# Patient Record
Sex: Female | Born: 1985
Health system: Southern US, Community
[De-identification: ages and names within clinical notes are randomized; demographics above are authoritative.]

## PROBLEM LIST (undated history)

## (undated) DIAGNOSIS — G4733 Obstructive sleep apnea (adult) (pediatric): Secondary | ICD-10-CM

## (undated) DIAGNOSIS — Z227 Latent tuberculosis: Secondary | ICD-10-CM

## (undated) DIAGNOSIS — T7840XA Allergy, unspecified, initial encounter: Secondary | ICD-10-CM

## (undated) DIAGNOSIS — Z5189 Encounter for other specified aftercare: Secondary | ICD-10-CM

## (undated) DIAGNOSIS — E669 Obesity, unspecified: Secondary | ICD-10-CM

## (undated) DIAGNOSIS — B977 Papillomavirus as the cause of diseases classified elsewhere: Secondary | ICD-10-CM

## (undated) DIAGNOSIS — F419 Anxiety disorder, unspecified: Secondary | ICD-10-CM

## (undated) DIAGNOSIS — F32A Depression, unspecified: Secondary | ICD-10-CM

## (undated) DIAGNOSIS — G4730 Sleep apnea, unspecified: Secondary | ICD-10-CM

## (undated) DIAGNOSIS — D649 Anemia, unspecified: Secondary | ICD-10-CM

## (undated) DIAGNOSIS — R7303 Prediabetes: Secondary | ICD-10-CM

## (undated) DIAGNOSIS — F410 Panic disorder [episodic paroxysmal anxiety] without agoraphobia: Secondary | ICD-10-CM

## (undated) DIAGNOSIS — F4310 Post-traumatic stress disorder, unspecified: Secondary | ICD-10-CM

## (undated) DIAGNOSIS — G43909 Migraine, unspecified, not intractable, without status migrainosus: Secondary | ICD-10-CM

## (undated) DIAGNOSIS — E785 Hyperlipidemia, unspecified: Secondary | ICD-10-CM

## (undated) DIAGNOSIS — J45909 Unspecified asthma, uncomplicated: Secondary | ICD-10-CM

## (undated) DIAGNOSIS — K219 Gastro-esophageal reflux disease without esophagitis: Secondary | ICD-10-CM

## (undated) HISTORY — DX: Unspecified asthma, uncomplicated: J45.909

## (undated) HISTORY — DX: Sleep apnea, unspecified: G47.30

## (undated) HISTORY — PX: OTHER SURGICAL HISTORY: SHX169

## (undated) HISTORY — DX: Encounter for other specified aftercare: Z51.89

## (undated) HISTORY — DX: Allergy, unspecified, initial encounter: T78.40XA

## (undated) HISTORY — PX: APPENDECTOMY: SHX54

## (undated) HISTORY — DX: Gastro-esophageal reflux disease without esophagitis: K21.9

## (undated) HISTORY — DX: Hyperlipidemia, unspecified: E78.5

## (undated) HISTORY — DX: Papillomavirus as the cause of diseases classified elsewhere: B97.7

## (undated) HISTORY — DX: Prediabetes: R73.03

## (undated) HISTORY — DX: Depression, unspecified: F32.A

## (undated) HISTORY — DX: Anemia, unspecified: D64.9

## (undated) HISTORY — DX: Migraine, unspecified, not intractable, without status migrainosus: G43.909

## (undated) HISTORY — DX: Latent tuberculosis: Z22.7

## (undated) HISTORY — PX: WISDOM TOOTH EXTRACTION: SHX21

## (undated) HISTORY — DX: Obstructive sleep apnea (adult) (pediatric): G47.33

---

## 2013-08-29 ENCOUNTER — Observation Stay: Payer: Self-pay | Admitting: Obstetrics and Gynecology

## 2013-08-30 ENCOUNTER — Observation Stay: Payer: Self-pay | Admitting: Obstetrics and Gynecology

## 2013-09-02 ENCOUNTER — Inpatient Hospital Stay: Payer: Self-pay | Admitting: Obstetrics and Gynecology

## 2013-09-02 LAB — CBC WITH DIFFERENTIAL/PLATELET
Basophil #: 0 10*3/uL (ref 0.0–0.1)
Basophil %: 0.8 %
Eosinophil #: 0.1 10*3/uL (ref 0.0–0.7)
Eosinophil %: 0.8 %
HCT: 35.2 % (ref 35.0–47.0)
HGB: 11.7 g/dL — ABNORMAL LOW (ref 12.0–16.0)
Lymphocyte #: 1.5 10*3/uL (ref 1.0–3.6)
Lymphocyte %: 23.5 %
MCH: 26.9 pg (ref 26.0–34.0)
MCHC: 33.2 g/dL (ref 32.0–36.0)
MCV: 81 fL (ref 80–100)
Monocyte #: 0.5 x10 3/mm (ref 0.2–0.9)
Monocyte %: 7.5 %
Neutrophil #: 4.4 10*3/uL (ref 1.4–6.5)
Neutrophil %: 67.4 %
Platelet: 147 10*3/uL — ABNORMAL LOW (ref 150–440)
RBC: 4.35 10*6/uL (ref 3.80–5.20)
RDW: 15.6 % — ABNORMAL HIGH (ref 11.5–14.5)
WBC: 6.6 10*3/uL (ref 3.6–11.0)

## 2013-09-03 LAB — HEMATOCRIT: HCT: 33 % — ABNORMAL LOW (ref 35.0–47.0)

## 2013-11-29 ENCOUNTER — Emergency Department: Payer: Self-pay | Admitting: Emergency Medicine

## 2014-02-18 ENCOUNTER — Emergency Department: Payer: Self-pay | Admitting: Emergency Medicine

## 2014-03-31 ENCOUNTER — Emergency Department: Payer: Self-pay | Admitting: Emergency Medicine

## 2014-03-31 LAB — URINALYSIS, COMPLETE
Bilirubin,UR: NEGATIVE
Blood: NEGATIVE
Glucose,UR: NEGATIVE mg/dL (ref 0–75)
Ketone: NEGATIVE
Leukocyte Esterase: NEGATIVE
Nitrite: NEGATIVE
Ph: 6 (ref 4.5–8.0)
Protein: NEGATIVE
RBC,UR: NONE SEEN /HPF (ref 0–5)
Specific Gravity: 1.03 (ref 1.003–1.030)

## 2014-03-31 LAB — BASIC METABOLIC PANEL
Anion Gap: 6 — ABNORMAL LOW (ref 7–16)
BUN: 11 mg/dL (ref 7–18)
Calcium, Total: 8.9 mg/dL (ref 8.5–10.1)
Chloride: 106 mmol/L (ref 98–107)
Co2: 27 mmol/L (ref 21–32)
Creatinine: 0.87 mg/dL (ref 0.60–1.30)
EGFR (African American): >60
EGFR (Non-African Amer.): >60
Glucose: 113 mg/dL — ABNORMAL HIGH (ref 65–99)
Osmolality: 278 (ref 275–301)
Potassium: 3.4 mmol/L — ABNORMAL LOW (ref 3.5–5.1)
Sodium: 139 mmol/L (ref 136–145)

## 2014-03-31 LAB — CBC
HCT: 36.4 % (ref 35.0–47.0)
HGB: 11.9 g/dL — ABNORMAL LOW (ref 12.0–16.0)
MCH: 27.1 pg (ref 26.0–34.0)
MCHC: 32.8 g/dL (ref 32.0–36.0)
MCV: 83 fL (ref 80–100)
Platelet: 187 10*3/uL (ref 150–440)
RBC: 4.41 10*6/uL (ref 3.80–5.20)
RDW: 13.4 % (ref 11.5–14.5)
WBC: 5.4 10*3/uL (ref 3.6–11.0)

## 2014-03-31 LAB — TROPONIN I
Troponin-I: <0.02 ng/mL
Troponin-I: <0.02 ng/mL

## 2014-09-17 ENCOUNTER — Emergency Department: Payer: Self-pay | Admitting: Emergency Medicine

## 2014-09-17 LAB — URINALYSIS, COMPLETE
Bacteria: NONE SEEN
Bilirubin,UR: NEGATIVE
Blood: NEGATIVE
Glucose,UR: NEGATIVE mg/dL (ref 0–75)
Ketone: NEGATIVE
Leukocyte Esterase: NEGATIVE
Nitrite: NEGATIVE
Ph: 5 (ref 4.5–8.0)
Protein: NEGATIVE
RBC,UR: <1 /HPF (ref 0–5)
Specific Gravity: 1.018 (ref 1.003–1.030)
Squamous Epithelial: 1
WBC UR: <1 /HPF (ref 0–5)

## 2014-09-17 LAB — COMPREHENSIVE METABOLIC PANEL
Albumin: 3.7 g/dL (ref 3.4–5.0)
Alkaline Phosphatase: 57 U/L
Anion Gap: 9 (ref 7–16)
BUN: 11 mg/dL (ref 7–18)
Bilirubin,Total: 0.4 mg/dL (ref 0.2–1.0)
Calcium, Total: 8.7 mg/dL (ref 8.5–10.1)
Chloride: 107 mmol/L (ref 98–107)
Co2: 24 mmol/L (ref 21–32)
Creatinine: 0.82 mg/dL (ref 0.60–1.30)
Glucose: 101 mg/dL — ABNORMAL HIGH (ref 65–99)
Osmolality: 279 (ref 275–301)
Potassium: 3.5 mmol/L (ref 3.5–5.1)
SGOT(AST): 13 U/L — ABNORMAL LOW (ref 15–37)
SGPT (ALT): 16 U/L
Sodium: 140 mmol/L (ref 136–145)
Total Protein: 7.5 g/dL (ref 6.4–8.2)

## 2014-09-17 LAB — CBC WITH DIFFERENTIAL/PLATELET
Basophil #: 0 10*3/uL (ref 0.0–0.1)
Basophil %: 0.8 %
Eosinophil #: 0.1 10*3/uL (ref 0.0–0.7)
Eosinophil %: 1.1 %
HCT: 37.4 % (ref 35.0–47.0)
HGB: 12.1 g/dL (ref 12.0–16.0)
Lymphocyte #: 2.1 10*3/uL (ref 1.0–3.6)
Lymphocyte %: 41.9 %
MCH: 26.9 pg (ref 26.0–34.0)
MCHC: 32.5 g/dL (ref 32.0–36.0)
MCV: 83 fL (ref 80–100)
Monocyte #: 0.3 x10 3/mm (ref 0.2–0.9)
Monocyte %: 6.6 %
Neutrophil #: 2.5 10*3/uL (ref 1.4–6.5)
Neutrophil %: 49.6 %
Platelet: 202 10*3/uL (ref 150–440)
RBC: 4.51 10*6/uL (ref 3.80–5.20)
RDW: 14.1 % (ref 11.5–14.5)
WBC: 5 10*3/uL (ref 3.6–11.0)

## 2014-10-28 ENCOUNTER — Emergency Department: Payer: Self-pay | Admitting: Internal Medicine

## 2014-11-21 DIAGNOSIS — F4310 Post-traumatic stress disorder, unspecified: Secondary | ICD-10-CM

## 2014-11-21 HISTORY — DX: Post-traumatic stress disorder, unspecified: F43.10

## 2014-12-31 ENCOUNTER — Emergency Department: Payer: Self-pay | Admitting: Emergency Medicine

## 2015-01-30 ENCOUNTER — Emergency Department: Payer: Self-pay | Admitting: Emergency Medicine

## 2015-02-08 ENCOUNTER — Emergency Department: Payer: Self-pay | Admitting: Emergency Medicine
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
## 2015-03-31 NOTE — H&P (Signed)
L&D Evaluation:  History Expanded:  HPI 29 yo G3P2002 at 354w0d by EDC of 09/02/13 by 8 week ultrasound.  Her pregnancy has been complicated by morbid obesity, gbs urinary tract infection.  She presents with regular uterine contractions.  No LOF, no VB. positive fetal movement.  [facility]. Notably G1 was complicated by postpartum hemorrhage requiring transfusion of 2 units pRBCs. G2 was uncomplicated.   Blood Type (Maternal) O positive   Group B Strep Results Maternal (Result >5wks must be treated as unknown) positive   Maternal HIV Negative   Maternal Syphilis Ab Nonreactive   Rubella Results (Maternal) immune   Presents with decreased fetal movement   Patient's Medical History 1) Morbid obesity, 2)anxiety   Patient's Surgical History Appendectomy   Medications Pre Natal Vitamins   Allergies ASA, reglan   Social History none   Family History Non-Contributory   ROS:  ROS All systems were reviewed.  HEENT, CNS, GI, GU, Respiratory, CV, Renal and Musculoskeletal systems were found to be normal.   Exam:  Vital Signs stable  BP 127/80, RR16, P100   Urine Protein not completed   General no apparent distress   Mental Status clear   Chest clear   Heart normal sinus rhythm   Abdomen gravid, non-tender   Estimated Fetal Weight Average for gestational age   Fetal Position cephalic   Back no CVAT   Edema no edema   Pelvic 4cm => 5.5cm per RN over 1 hour   Mebranes Intact   FHT normal rate with no decels, 135, moderate, +accels, +early/variable decels   Ucx 2-3 q 10 min   Skin no lesions   Impression:  Impression 1) Intrauterine pregnancy at 344w0d, 2) morbid obesity, 3) active labor   Plan:  Plan EFM/NST, monitor contractions and for cervical change, antibiotics for GBBS prophylaxis   Comments - Admit for labor - Expectant management - GBS +: Ampicillin - Fetal well being: reassuring overall - clear liquid diet, IVF -  CBC/T&S   Electronic Signatures: Conard NovakJackson, Carlis Blanchard D (MD)  (Signed 13-Oct-14 06:42)  Authored: L&D Evaluation   Last Updated: 13-Oct-14 06:42 by Conard NovakJackson, Harshil Cavallaro D (MD)

## 2015-03-31 NOTE — H&P (Signed)
L&D Evaluation:  History:  HPI 29 yo G3P2002 [facility] with reactive NST on 08/28/13 in clinic an 08/29/13 here on L&D who calls reporting decreased fetal movemetn.  She does report fetal movement but states she has not obtained 10 movement in an hour.  No LOF, no VB no ctx.  [facility].  PNC thus far noteable for BMI of 42,1-hr glucola of 98, GBS bacteruria.   Presents with decreased fetal movement   Patient's Medical History anxiety   Patient's Surgical History Appendectomy   Medications Pre Natal Vitamins   Allergies ASA, reglan   Social History none   Family History Non-Contributory   ROS:  ROS All systems were reviewed.  HEENT, CNS, GI, GU, Respiratory, CV, Renal and Musculoskeletal systems were found to be normal.   Exam:  Vital Signs stable   Urine Protein not completed   General no apparent distress   Mental Status clear   FHT normal rate with no decels, 140, moderate, +accels, no decels  reactive NST category I tracing   Ucx absent   Impression:  Impression decreased fetal movement   Plan:  Plan EFM/NST   Comments reactive NST routine labor precautions with repeat NST/AFI [facility] coming [facility]   Electronic Signatures: Lorrene ReidStaebler, Marika Mahaffy M (MD)  (Signed 10-Oct-14 22:28)  Authored: L&D Evaluation   Last Updated: 10-Oct-14 22:28 by Lorrene ReidStaebler, Yashvi Jasinski M (MD)

## 2015-04-01 ENCOUNTER — Ambulatory Visit
Admission: RE | Admit: 2015-04-01 | Discharge: 2015-04-01 | Disposition: A | Discharge status: Home / Self Care | Payer: Disability Insurance | Source: Ambulatory Visit | Attending: Internal Medicine | Admitting: Internal Medicine

## 2015-04-01 ENCOUNTER — Other Ambulatory Visit: Payer: Self-pay | Admitting: Internal Medicine

## 2015-04-01 DIAGNOSIS — M1990 Unspecified osteoarthritis, unspecified site: Secondary | ICD-10-CM

## 2015-04-01 DIAGNOSIS — M7732 Calcaneal spur, left foot: Secondary | ICD-10-CM | POA: Diagnosis not present

## 2015-04-01 DIAGNOSIS — F329 Major depressive disorder, single episode, unspecified: Secondary | ICD-10-CM | POA: Diagnosis not present

## 2015-04-01 DIAGNOSIS — R52 Pain, unspecified: Secondary | ICD-10-CM

## 2015-04-01 DIAGNOSIS — H539 Unspecified visual disturbance: Secondary | ICD-10-CM | POA: Diagnosis not present

## 2015-04-01 DIAGNOSIS — M25862 Other specified joint disorders, left knee: Secondary | ICD-10-CM | POA: Insufficient documentation

## 2015-05-22 LAB — HM PAP SMEAR: HM Pap smear: NEGATIVE

## 2015-07-14 ENCOUNTER — Emergency Department
Admission: EM | Admit: 2015-07-14 | Discharge: 2015-07-14 | Disposition: A | Discharge status: Home / Self Care | Payer: Medicaid Other | Attending: Emergency Medicine | Admitting: Emergency Medicine

## 2015-07-14 ENCOUNTER — Emergency Department: Payer: Medicaid Other

## 2015-07-14 ENCOUNTER — Encounter: Payer: Self-pay | Admitting: Emergency Medicine

## 2015-07-14 DIAGNOSIS — Y998 Other external cause status: Secondary | ICD-10-CM | POA: Insufficient documentation

## 2015-07-14 DIAGNOSIS — Y92009 Unspecified place in unspecified non-institutional (private) residence as the place of occurrence of the external cause: Secondary | ICD-10-CM | POA: Insufficient documentation

## 2015-07-14 DIAGNOSIS — Z79899 Other long term (current) drug therapy: Secondary | ICD-10-CM | POA: Insufficient documentation

## 2015-07-14 DIAGNOSIS — M25531 Pain in right wrist: Secondary | ICD-10-CM

## 2015-07-14 DIAGNOSIS — S060X0A Concussion without loss of consciousness, initial encounter: Secondary | ICD-10-CM | POA: Diagnosis not present

## 2015-07-14 DIAGNOSIS — S6991XA Unspecified injury of right wrist, hand and finger(s), initial encounter: Secondary | ICD-10-CM | POA: Diagnosis present

## 2015-07-14 DIAGNOSIS — Y9389 Activity, other specified: Secondary | ICD-10-CM | POA: Diagnosis not present

## 2015-07-14 DIAGNOSIS — Z3202 Encounter for pregnancy test, result negative: Secondary | ICD-10-CM | POA: Diagnosis not present

## 2015-07-14 HISTORY — DX: Post-traumatic stress disorder, unspecified: F43.10

## 2015-07-14 HISTORY — DX: Panic disorder (episodic paroxysmal anxiety): F41.0

## 2015-07-14 HISTORY — DX: Anxiety disorder, unspecified: F41.9

## 2015-07-14 MED ORDER — ACETAMINOPHEN 500 MG PO TABS
1000.0000 mg | ORAL_TABLET | Freq: Once | ORAL | Status: AC
  Administered 2015-07-14: 1000 mg via ORAL

## 2015-07-14 MED ORDER — ACETAMINOPHEN 500 MG PO TABS
ORAL_TABLET | ORAL | Status: AC
  Administered 2015-07-14: 1000 mg via ORAL
  Filled 2015-07-14: qty 2
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
## 2015-07-14 NOTE — Discharge Instructions (Signed)
Concussion °A concussion is a brain injury. It is caused by: °· A hit to the head. °· A quick and sudden movement (jolt) of the head or neck. °A concussion is usually not life threatening. Even so, it can cause serious problems. If you had a concussion before, you may have concussion-like problems after a hit to your head. °HOME CARE °General Instructions °· Follow your doctor's directions carefully. °· Take medicines only as told by your doctor. °· Only take medicines your doctor says are safe. °· Do not drink alcohol until your doctor says it is okay. Alcohol and some drugs can slow down healing. They can also put you at risk for further injury. °· If you are having trouble remembering things, write them down. °· Try to do one thing at a time if you get distracted easily. For example, do not watch TV while making dinner. °· Talk to your family members or close friends when making important decisions. °· Follow up with your doctor as told. °· Watch your symptoms. Tell others to do the same. Serious problems can sometimes happen after a concussion. Older adults are more likely to have these problems. °· Tell your teachers, school nurse, school counselor, coach, athletic trainer, or work manager about your concussion. Tell them about what you can or cannot do. They should watch to see if: °¨ It gets even harder for you to pay attention or concentrate. °¨ It gets even harder for you to remember things or learn new things. °¨ You need more time than normal to finish things. °¨ You become annoyed (irritable) more than before. °¨ You are not able to deal with stress as well. °¨ You have more problems than before. °· Rest. Make sure you: °¨ Get plenty of sleep at night. °¨ Go to sleep early. °¨ Go to bed at the same time every day. Try to wake up at the same time. °¨ Rest during the day. °¨ Take naps when you feel tired. °· Limit activities where you have to think a lot or concentrate. These include: °¨ Doing  homework. °¨ Doing work related to a job. °¨ Watching TV. °¨ Using the computer. °Returning To Your Regular Activities °Return to your normal activities slowly, not all at once. You must give your body and brain enough time to heal.  °· Do not play sports or do other athletic activities until your doctor says it is okay. °· Ask your doctor when you can drive, ride a bicycle, or work other vehicles or machines. Never do these things if you feel dizzy. °· Ask your doctor about when you can return to work or school. °Preventing Another Concussion °It is very important to avoid another brain injury, especially before you have healed. In rare cases, another injury can lead to permanent brain damage, brain swelling, or death. The risk of this is greatest during the first 7-10 days after your injury. Avoid injuries by:  °· Wearing a seat belt when riding in a car. °· Not drinking too much alcohol. °· Avoiding activities that could lead to a second concussion (such as contact sports). °· Wearing a helmet when doing activities like: °¨ Biking. °¨ Skiing. °¨ Skateboarding. °¨ Skating. °· Making your home safer by: °¨ Removing things from the floor or stairways that could make you trip. °¨ Using grab bars in bathrooms and handrails by stairs. °¨ Placing non-slip mats on floors and in bathtubs. °¨ Improve lighting in dark areas. °GET HELP IF: °· It   gets even harder for you to pay attention or concentrate.  It gets even harder for you to remember things or learn new things.  You need more time than normal to finish things.  You become annoyed (irritable) more than before.  You are not able to deal with stress as well.  You have more problems than before.  You have problems keeping your balance.  You are not able to react quickly when you should. Get help if you have any of these problems for more than 2 weeks:   Lasting (chronic) headaches.  Dizziness or trouble balancing.  Feeling sick to your stomach  (nausea).  Seeing (vision) problems.  Being affected by noises or light more than normal.  Feeling sad, low, down in the dumps, blue, gloomy, or empty (depressed).  Mood changes (mood swings).  Feeling of fear or nervousness about what may happen (anxiety).  Feeling annoyed.  Memory problems.  Problems concentrating or paying attention.  Sleep problems.  Feeling tired all the time. GET HELP RIGHT AWAY IF:   You have bad headaches or your headaches get worse.  You have weakness (even if it is in one hand, leg, or part of the face).  You have loss of feeling (numbness).  You feel off balance.  You keep throwing up (vomiting).  You feel tired.  One black center of your eye (pupil) is larger than the other.  You twitch or shake violently (convulse).  Your speech is not clear (slurred).  You are more confused, easily angered (agitated), or annoyed than before.  You have more trouble resting than before.  You are unable to recognize people or places.  You have neck pain.  It is difficult to wake you up.  You have unusual behavior changes.  You pass out (lose consciousness). MAKE SURE YOU:   Understand these instructions.  Will watch your condition.  Will get help right away if you are not doing well or get worse. Document Released: 10/26/2009 Document Revised: 03/24/2014 Document Reviewed: 05/30/2013 [facility] Patient Information 2015 [place], [place]. This information is not intended to replace advice given to you by your health care provider. Make sure you discuss any questions you have with your health care provider.  Please follow up with Dr Ernest Pine as instructed. Wear the wrist splint. Keep your wrist elevated as much as possible. You can use ice to the area 20 minutes every hour or so while you are awaye for the first 24 hours.  Take tylenol for the pain.

## 2015-07-14 NOTE — ED Notes (Signed)
Urine preg. Negative.
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
## 2015-07-15 LAB — POCT PREGNANCY, URINE: Preg Test, Ur: NEGATIVE

## 2015-09-22 ENCOUNTER — Emergency Department: Payer: Medicaid Other

## 2015-09-22 ENCOUNTER — Emergency Department
Admission: EM | Admit: 2015-09-22 | Discharge: 2015-09-22 | Disposition: A | Discharge status: Home / Self Care | Payer: Medicaid Other | Attending: Emergency Medicine | Admitting: Emergency Medicine

## 2015-09-22 ENCOUNTER — Encounter: Payer: Self-pay | Admitting: Emergency Medicine

## 2015-09-22 DIAGNOSIS — M25532 Pain in left wrist: Secondary | ICD-10-CM

## 2015-09-22 DIAGNOSIS — Z79899 Other long term (current) drug therapy: Secondary | ICD-10-CM | POA: Insufficient documentation

## 2015-09-22 MED ORDER — NAPROXEN 500 MG PO TBEC: 500.0000 mg | DELAYED_RELEASE_TABLET | Freq: Two times a day (BID) | ORAL | Status: DC

## 2015-09-22 MED ORDER — HYDROCODONE-ACETAMINOPHEN 5-325 MG PO TABS: 1.0000 | ORAL_TABLET | ORAL | Status: DC | PRN

## 2015-09-22 NOTE — ED Notes (Signed)
C/o left wrist pain and states she feels "knot" to right wrist, states she has had this off and on for a while

## 2015-09-22 NOTE — ED Provider Notes (Signed)
[facility] Emergency Department Provider Note  ____________________________________________  Time seen: Approximately 10:28 AM  I have reviewed the triage vital signs and the nursing notes.   HISTORY  Chief Complaint Wrist Pain    HPI Jasmine Gordon is a 29 y.o. female who presents for evaluation of wrist pain on the left side on and off for "a while". In addition she feels a knot to the right wrist.Located on the dorsum side and not will come and go. Denies any trauma.   Past Medical History  Diagnosis Date  . PTSD (post-traumatic stress disorder) 2016  . Anxiety   . Panic attack     There are no active problems to display for this patient.   Past Surgical History  Procedure Laterality Date  . Appendectomy      Current Outpatient Rx  Name  Route  Sig  Dispense  Refill  . escitalopram (LEXAPRO) 10 MG tablet   Oral   Take 10 mg by mouth daily.         Marland Kitchen HYDROcodone-acetaminophen (NORCO) 5-325 MG tablet   Oral   Take 1-2 tablets by mouth every 4 (four) hours as needed for moderate pain.   10 tablet   0   . naproxen (EC NAPROSYN) 500 MG EC tablet   Oral   Take 1 tablet (500 mg total) by mouth 2 (two) times daily with a meal.   60 tablet   0     Allergies Asa and Reglan  No family history on file.  Social History Social History  Substance Use Topics  . Smoking status: Never Smoker   . Smokeless tobacco: Never Used  . Alcohol Use: Yes     Comment: occ    Review of Systems Constitutional: No fever/chills Eyes: No visual changes. ENT: No sore throat. Cardiovascular: Denies chest pain. Respiratory: Denies shortness of breath. Gastrointestinal: No abdominal pain.  No nausea, no vomiting.  No diarrhea.  No constipation. Genitourinary: Negative for dysuria. Musculoskeletal: Positive for left wrist pain. Skin: Negative for rash. Neurological: Negative for headaches, focal weakness or numbness.  10-point ROS otherwise  negative.  ____________________________________________   PHYSICAL EXAM:  VITAL SIGNS: ED Triage Vitals  Enc Vitals Group     BP 09/22/15 0948 127/69 mmHg     Pulse Rate 09/22/15 0948 109     Resp 09/22/15 0948 18     Temp 09/22/15 0948 99.4 F (37.4 C)     Temp Source 09/22/15 0948 Oral     SpO2 09/22/15 0948 98 %     Weight 09/22/15 0946 263 lb (119.296 kg)     Height 09/22/15 0946  (1.702 m)     Head Cir --      Peak Flow --      Pain Score 09/22/15 0947 8     Pain Loc --      Pain Edu? --      Excl. in GC? --     Constitutional: Alert and oriented. Well appearing and in no acute distress.   Cardiovascular: Normal rate, regular rhythm. Grossly normal heart sounds.  Good peripheral circulation. Respiratory: Normal respiratory effort.  No retractions. Lungs CTAB. Musculoskeletal: Left wrist with some point tenderness noted at the crease. Increased pain with flexion. No obvious cyst noted. No erythema noted. Neurologic:  Normal speech and language. No gross focal neurologic deficits are appreciated. No gait instability. Skin:  Skin is warm, dry and intact. No rash noted. Psychiatric: Mood and affect  are normal. Speech and behavior are normal.  ____________________________________________   LABS (all labs ordered are listed, but only abnormal results are displayed)  Labs Reviewed - No data to display ____________________________________________   RADIOLOGY  Negative for bony abnormality or arthropathy. ____________________________________________   PROCEDURES  Procedure(s) performed: None  Critical Care performed: No  ____________________________________________   INITIAL IMPRESSION / ASSESSMENT AND PLAN / ED COURSE  Pertinent labs & imaging results that were available during my care of the patient were reviewed by me and considered in my medical decision making (see chart for details).  Acute left wrist pain etiology unsure. Possible evidence for  ganglion cyst referral orthopedics if continued pain. Patient voices no other emergency medical complaints at this time. Rx given for Anaprox DS twice a day, and Norco 5/325 #8. ____________________________________________   FINAL CLINICAL IMPRESSION(S) / ED DIAGNOSES  Final diagnoses:  Wrist pain, acute, left      Evangeline Dakinharles M Amit Leece, PA-C 09/22/15 1153  Rockne MenghiniAnne-Caroline Norman, MD 09/22/15 1454

## 2015-09-22 NOTE — ED Notes (Signed)
Left wrist splint applied.

## 2015-09-22 NOTE — Discharge Instructions (Signed)
Heat Therapy °Heat therapy can help ease sore, stiff, injured, and tight muscles and joints. Heat relaxes your muscles, which may help ease your pain.  °RISKS AND COMPLICATIONS °If you have any of the following conditions, do not use heat therapy unless your health care provider has approved: °· Poor circulation. °· Healing wounds or scarred skin in the area being treated. °· Diabetes, heart disease, or high blood pressure. °· Not being able to feel (numbness) the area being treated. °· Unusual swelling of the area being treated. °· Active infections. °· Blood clots. °· Cancer. °· Inability to communicate pain. This may include young children and people who have problems with their brain function (dementia). °· Pregnancy. °Heat therapy should only be used on old, pre-existing, or long-lasting (chronic) injuries. Do not use heat therapy on new injuries unless directed by your health care provider. °HOW TO USE HEAT THERAPY °There are several different kinds of heat therapy, including: °· Moist heat pack. °· Warm water bath. °· Hot water bottle. °· Electric heating pad. °· Heated gel pack. °· Heated wrap. °· Electric heating pad. °Use the heat therapy method suggested by your health care provider. Follow your health care provider's instructions on when and how to use heat therapy. °GENERAL HEAT THERAPY RECOMMENDATIONS °· Do not sleep while using heat therapy. Only use heat therapy while you are awake. °· Your skin may turn pink while using heat therapy. Do not use heat therapy if your skin turns red. °· Do not use heat therapy if you have new pain. °· High heat or long exposure to heat can cause burns. Be careful when using heat therapy to avoid burning your skin. °· Do not use heat therapy on areas of your skin that are already irritated, such as with a rash or sunburn. °SEEK MEDICAL CARE IF: °· You have blisters, redness, swelling, or numbness. °· You have new pain. °· Your pain is worse. °MAKE SURE  YOU: °· Understand these instructions. °· Will watch your condition. °· Will get help right away if you are not doing well or get worse. °  °This information is not intended to replace advice given to you by your health care provider. Make sure you discuss any questions you have with your health care provider. °  °Document Released: 01/30/2012 Document Revised: 11/28/2014 Document Reviewed: 12/31/2013 °Elsevier Interactive Patient Education ©2016 [employer] ° °Wrist Pain °There are many things that can cause wrist pain. Some common causes include: °· An injury to the wrist area, such as a sprain, strain, or fracture. °· Overuse of the joint. °· A condition that causes increased pressure on a nerve in the wrist (carpal tunnel syndrome). °· Wear and tear of the joints that occurs with aging (osteoarthritis). °· A variety of other types of arthritis. °Sometimes, the cause of wrist pain is not known. The pain often goes away when you follow your health care provider's instructions for relieving pain at home. If your wrist pain continues, tests may need to be done to diagnose your condition. °HOME CARE INSTRUCTIONS °Pay attention to any changes in your symptoms. Take these actions to help with your pain: °· Rest the wrist area for at least 48 hours or as told by your health care provider. °· If directed, apply ice to the injured area: °· Put ice in a plastic bag. °· Place a towel between your skin and the bag. °· Leave the ice on for 20 minutes, 2-3 times per day. °· Keep your arm   raised (elevated) above the level of your heart while you are sitting or lying down. °· If a splint or elastic bandage has been applied, use it as told by your health care provider. °· Remove the splint or bandage only as told by your health care provider. °· Loosen the splint or bandage if your fingers become numb or have a tingling feeling, or if they turn cold or blue. °· Take over-the-counter and prescription medicines only as told by  your health care provider. °· Keep all follow-up visits as told by your health care provider. This is important. °SEEK MEDICAL CARE IF: °· Your pain is not helped by treatment. °· Your pain gets worse. °SEEK IMMEDIATE MEDICAL CARE IF: °· Your fingers become swollen. °· Your fingers turn white, very red, or cold and blue. °· Your fingers are numb or have a tingling feeling. °· You have difficulty moving your fingers. °  °This information is not intended to replace advice given to you by your health care provider. Make sure you discuss any questions you have with your health care provider. °  °Document Released: 08/17/2005 Document Revised: 07/29/2015 Document Reviewed: 03/25/2015 °Elsevier Interactive Patient Education ©2016 [employer] ° ° °

## 2015-09-22 NOTE — ED Notes (Signed)
Chronic left wrist pain

## 2015-10-06 ENCOUNTER — Encounter: Payer: Self-pay | Admitting: Emergency Medicine

## 2015-10-06 ENCOUNTER — Emergency Department
Admission: EM | Admit: 2015-10-06 | Discharge: 2015-10-07 | Disposition: A | Discharge status: Home / Self Care | Payer: BLUE CROSS/BLUE SHIELD | Attending: Emergency Medicine | Admitting: Emergency Medicine

## 2015-10-06 DIAGNOSIS — R110 Nausea: Secondary | ICD-10-CM | POA: Diagnosis not present

## 2015-10-06 DIAGNOSIS — R51 Headache: Secondary | ICD-10-CM | POA: Diagnosis not present

## 2015-10-06 DIAGNOSIS — Z3202 Encounter for pregnancy test, result negative: Secondary | ICD-10-CM | POA: Diagnosis not present

## 2015-10-06 DIAGNOSIS — R42 Dizziness and giddiness: Secondary | ICD-10-CM | POA: Insufficient documentation

## 2015-10-06 DIAGNOSIS — Z791 Long term (current) use of non-steroidal anti-inflammatories (NSAID): Secondary | ICD-10-CM | POA: Diagnosis not present

## 2015-10-06 DIAGNOSIS — R1012 Left upper quadrant pain: Secondary | ICD-10-CM

## 2015-10-06 DIAGNOSIS — R519 Headache, unspecified: Secondary | ICD-10-CM

## 2015-10-06 DIAGNOSIS — Z79899 Other long term (current) drug therapy: Secondary | ICD-10-CM | POA: Diagnosis not present

## 2015-10-06 LAB — CBC
HCT: 37.2 % (ref 35.0–47.0)
Hemoglobin: 12.3 g/dL (ref 12.0–16.0)
MCH: 27 pg (ref 26.0–34.0)
MCHC: 33 g/dL (ref 32.0–36.0)
MCV: 81.8 fL (ref 80.0–100.0)
Platelets: 200 10*3/uL (ref 150–440)
RBC: 4.54 MIL/uL (ref 3.80–5.20)
RDW: 13.5 % (ref 11.5–14.5)
WBC: 6.1 10*3/uL (ref 3.6–11.0)

## 2015-10-06 LAB — POCT PREGNANCY, URINE: Preg Test, Ur: NEGATIVE

## 2015-10-06 LAB — URINALYSIS COMPLETE WITH MICROSCOPIC (ARMC ONLY)
Bacteria, UA: NONE SEEN
Bilirubin Urine: NEGATIVE
Glucose, UA: NEGATIVE mg/dL
Ketones, ur: NEGATIVE mg/dL
Leukocytes, UA: NEGATIVE
Nitrite: NEGATIVE
Protein, ur: NEGATIVE mg/dL
Specific Gravity, Urine: 1.021 (ref 1.005–1.030)
pH: 6 (ref 5.0–8.0)

## 2015-10-06 LAB — COMPREHENSIVE METABOLIC PANEL
ALT: 15 U/L (ref 14–54)
AST: 17 U/L (ref 15–41)
Albumin: 4.1 g/dL (ref 3.5–5.0)
Alkaline Phosphatase: 51 U/L (ref 38–126)
Anion gap: 4 — ABNORMAL LOW (ref 5–15)
BUN: 14 mg/dL (ref 6–20)
CO2: 24 mmol/L (ref 22–32)
Calcium: 9.2 mg/dL (ref 8.9–10.3)
Chloride: 110 mmol/L (ref 101–111)
Creatinine, Ser: 0.77 mg/dL (ref 0.44–1.00)
GFR calc Af Amer: >60 mL/min (ref >60)
GFR calc non Af Amer: >60 mL/min (ref >60)
Glucose, Bld: 106 mg/dL — ABNORMAL HIGH (ref 65–99)
Potassium: 3.5 mmol/L (ref 3.5–5.1)
Sodium: 138 mmol/L (ref 135–145)
Total Bilirubin: 0.2 mg/dL — ABNORMAL LOW (ref 0.3–1.2)
Total Protein: 7.5 g/dL (ref 6.5–8.1)

## 2015-10-06 LAB — LIPASE, BLOOD: Lipase: 28 U/L (ref 11–51)

## 2015-10-06 MED ORDER — GI COCKTAIL ~~LOC~~
30.0000 mL | Freq: Once | ORAL | Status: AC
  Administered 2015-10-06: 30 mL via ORAL
  Filled 2015-10-06: qty 30

## 2015-10-06 MED ORDER — BUTALBITAL-APAP-CAFFEINE 50-325-40 MG PO TABS
1.0000 | ORAL_TABLET | Freq: Once | ORAL | Status: AC
  Administered 2015-10-06: 1 via ORAL
  Filled 2015-10-06: qty 1

## 2015-10-06 NOTE — ED Notes (Signed)
Had to redraw blood work and resend urine. Pt stickers did not have her name on tubes or urine when sent the first time.  LM EDT

## 2015-10-06 NOTE — ED Notes (Signed)
Pt to triage via w/c with no distress noted; pt reports last few days having dizziness, nausea and bloating in stomach and intermittent "poking" to left side of head

## 2015-10-07 MED ORDER — FAMOTIDINE 40 MG PO TABS: 40.0000 mg | ORAL_TABLET | Freq: Every evening | ORAL | Status: DC

## 2015-10-07 MED ORDER — BUTALBITAL-APAP-CAFFEINE 50-325-40 MG PO TABS: 1.0000 | ORAL_TABLET | Freq: Four times a day (QID) | ORAL | Status: DC | PRN

## 2015-10-07 NOTE — Discharge Instructions (Signed)
Abdominal Pain, Adult °Many things can cause abdominal pain. Usually, abdominal pain is not caused by a disease and will improve without treatment. It can often be observed and treated at home. Your health care provider will do a physical exam and possibly order blood tests and X-rays to help determine the seriousness of your pain. However, in many cases, more time must pass before a clear cause of the pain can be found. Before that point, your health care provider may not know if you need more testing or further treatment. °HOME CARE INSTRUCTIONS °Monitor your abdominal pain for any changes. The following actions may help to alleviate any discomfort you are experiencing: °· Only take over-the-counter or prescription medicines as directed by your health care provider. °· Do not take laxatives unless directed to do so by your health care provider. °· Try a clear liquid diet (broth, tea, or water) as directed by your health care provider. Slowly move to a bland diet as tolerated. °SEEK MEDICAL CARE IF: °· You have unexplained abdominal pain. °· You have abdominal pain associated with nausea or diarrhea. °· You have pain when you urinate or have a bowel movement. °· You experience abdominal pain that wakes you in the night. °· You have abdominal pain that is worsened or improved by eating food. °· You have abdominal pain that is worsened with eating fatty foods. °· You have a fever. °SEEK IMMEDIATE MEDICAL CARE IF: °· Your pain does not go away within 2 hours. °· You keep throwing up (vomiting). °· Your pain is felt only in portions of the abdomen, such as the right side or the left lower portion of the abdomen. °· You pass bloody or black tarry stools. °MAKE SURE YOU: °· Understand these instructions. °· Will watch your condition. °· Will get help right away if you are not doing well or get worse. °  °This information is not intended to replace advice given to you by your health care provider. Make sure you discuss  any questions you have with your health care provider. °  °Document Released: 08/17/2005 Document Revised: 07/29/2015 Document Reviewed: 07/17/2013 °Elsevier Interactive Patient Education ©2016 [employer] ° °General Headache Without Cause °A headache is pain or discomfort felt around the head or neck area. The specific cause of a headache may not be found. There are many causes and types of headaches. A few common ones are: °· Tension headaches. °· Migraine headaches. °· Cluster headaches. °· Chronic daily headaches. °HOME CARE INSTRUCTIONS  °Watch your condition for any changes. Take these steps to help with your condition: °Managing Pain °· Take over-the-counter and prescription medicines only as told by your health care provider. °· Lie down in a dark, quiet room when you have a headache. °· If directed, apply ice to the head and neck area: °¨ Put ice in a plastic bag. °¨ Place a towel between your skin and the bag. °¨ Leave the ice on for 20 minutes, 2-3 times per day. °· Use a heating pad or hot shower to apply heat to the head and neck area as told by your health care provider. °· Keep lights dim if bright lights bother you or make your headaches worse. °Eating and Drinking °· Eat meals on a regular schedule. °· Limit alcohol use. °· Decrease the amount of caffeine you drink, or stop drinking caffeine. °General Instructions °· Keep all follow-up visits as told by your health care provider. This is important. °· Keep a headache journal to help   find out what may trigger your headaches. For example, write down: °¨ What you eat and drink. °¨ How much sleep you get. °¨ Any change to your diet or medicines. °· Try massage or other relaxation techniques. °· Limit stress. °· Sit up straight, and do not tense your muscles. °· Do not use tobacco products, including cigarettes, chewing tobacco, or e-cigarettes. If you need help quitting, ask your health care provider. °· Exercise regularly as told by your health care  provider. °· Sleep on a regular schedule. Get 7-9 hours of sleep, or the amount recommended by your health care provider. °SEEK MEDICAL CARE IF:  °· Your symptoms are not helped by medicine. °· You have a headache that is different from the usual headache. °· You have nausea or you vomit. °· You have a fever. °SEEK IMMEDIATE MEDICAL CARE IF:  °· Your headache becomes severe. °· You have repeated vomiting. °· You have a stiff neck. °· You have a loss of vision. °· You have problems with speech. °· You have pain in the eye or ear. °· You have muscular weakness or loss of muscle control. °· You lose your balance or have trouble walking. °· You feel faint or pass out. °· You have confusion. °  °This information is not intended to replace advice given to you by your health care provider. Make sure you discuss any questions you have with your health care provider. °  °Document Released: 11/07/2005 Document Revised: 07/29/2015 Document Reviewed: 03/02/2015 °Elsevier Interactive Patient Education ©2016 [employer]. ° °

## 2015-10-07 NOTE — ED Provider Notes (Signed)
[facility] Emergency Department Provider Note  ____________________________________________  Time seen: Approximately 2319 PM  I have reviewed the triage vital signs and the nursing notes.   HISTORY  Chief Complaint Headache; Dizziness; and Abdominal Pain    HPI Jasmine Gordon is a 29 y.o. female who comes into the hospital today with left upper abdominal pain. The patient reports that she's been feeling very full with decreased appetite over the last couple of days. She reports that the pain is dull and rated a 3 out of 10 in intensity. She is unsure if it's gas but reports she felt jittery and is concerned that her important organs are on the left side so she decided to come in for evaluation. The patient has had some nausea and reports that the pain has been going on throughout the day today. The patient did not take any medications. She was told to some tea but reports that she started to feel a bit worse and she decided to come in. The patient has no primary care physician, has not had any fever shortness of breath or chest pain. She reports that the pain does not radiate anywhere. The patient has not had any pain might this before. She reports that she's been eating and getting tired shortly thereafter and going to bed immediately and isn't sure if that has anything to do with her symptoms. The patient has not had any sweats no surgeries or new meds. She is here for evaluation. Patient reports also that she's had some intermittent headaches but has been told that it could be due to her birth control pills. She reports that this is been going on for some time but is unable to quantify how long.   Past Medical History  Diagnosis Date  . PTSD (post-traumatic stress disorder) 2016  . Anxiety   . Panic attack     There are no active problems to display for this patient.   Past Surgical History  Procedure Laterality Date  . Appendectomy      Current  Outpatient Rx  Name  Route  Sig  Dispense  Refill  . escitalopram (LEXAPRO) 10 MG tablet   Oral   Take 5 mg by mouth daily.          . butalbital-acetaminophen-caffeine (FIORICET) 50-325-40 MG tablet   Oral   Take 1-2 tablets by mouth every 6 (six) hours as needed for headache.   20 tablet   0   . famotidine (PEPCID) 40 MG tablet   Oral   Take 1 tablet (40 mg total) by mouth every evening.   15 tablet   0   . HYDROcodone-acetaminophen (NORCO) 5-325 MG tablet   Oral   Take 1-2 tablets by mouth every 4 (four) hours as needed for moderate pain. Patient not taking: Reported on 10/06/2015   10 tablet   0   . naproxen (EC NAPROSYN) 500 MG EC tablet   Oral   Take 1 tablet (500 mg total) by mouth 2 (two) times daily with a meal. Patient not taking: Reported on 10/06/2015   60 tablet   0     Allergies Asa and Reglan  No family history on file.  Social History Social History  Substance Use Topics  . Smoking status: Never Smoker   . Smokeless tobacco: Never Used  . Alcohol Use: Yes     Comment: occ    Review of Systems Constitutional: No fever/chills Eyes: No visual changes. ENT: No sore  throat. Cardiovascular: Denies chest pain. Respiratory: Denies shortness of breath. Gastrointestinal: Left upper quadrant abdominal pain and nausea Genitourinary: Negative for dysuria. Musculoskeletal: Negative for back pain. Skin: Negative for rash. Neurological: Dizziness and headache  10-point ROS otherwise negative.  ____________________________________________   PHYSICAL EXAM:  VITAL SIGNS: ED Triage Vitals  Enc Vitals Group     BP 10/06/15 2002 125/71 mmHg     Pulse Rate 10/06/15 2002 99     Resp 10/06/15 2002 18     Temp 10/06/15 2002 99.6 F (37.6 C)     Temp Source 10/06/15 2002 Oral     SpO2 10/06/15 2002 98 %     Weight 10/06/15 2002 261 lb (118.389 kg)     Height 10/06/15 2002  (1.702 m)     Head Cir --      Peak Flow --      Pain Score 10/06/15  2001 8     Pain Loc --      Pain Edu? --      Excl. in GC? --     Constitutional: Alert and oriented. Well appearing and in no acute distress. Eyes: Conjunctivae are normal. PERRL. EOMI. Head: Atraumatic. Nose: No congestion/rhinnorhea. Mouth/Throat: Mucous membranes are moist.  Oropharynx non-erythematous. Cardiovascular: Normal rate, regular rhythm. Grossly normal heart sounds.  Good peripheral circulation. Respiratory: Normal respiratory effort.  No retractions. Lungs CTAB. Gastrointestinal: Soft and nontender. No distention. Positive bowel sounds Musculoskeletal: No lower extremity tenderness nor edema.   Neurologic:  Normal speech and language.  Skin:  Skin is warm, dry and intact.  Psychiatric: Mood and affect are normal.   ____________________________________________   LABS (all labs ordered are listed, but only abnormal results are displayed)  Labs Reviewed  URINALYSIS COMPLETEWITH MICROSCOPIC ([facility] ONLY) - Abnormal; Notable for the following:    Color, Urine YELLOW (*)    APPearance CLEAR (*)    Hgb urine dipstick 1+ (*)    Squamous Epithelial / LPF 0-5 (*)    All other components within normal limits  COMPREHENSIVE METABOLIC PANEL - Abnormal; Notable for the following:    Glucose, Bld 106 (*)    Total Bilirubin 0.2 (*)    Anion gap 4 (*)    All other components within normal limits  CBC  LIPASE, BLOOD  POC URINE PREG, ED  POCT PREGNANCY, URINE   ____________________________________________  EKG  ED ECG REPORT I, Rebecka Apley, the attending physician, personally viewed and interpreted this ECG.   Date: 10/06/2015  EKG Time: 2016  Rate: 95  Rhythm: normal sinus rhythm  Axis: normal  Intervals:none  ST&T Change: flipped t waves leads II and III seen previously 12/2014  ____________________________________________  RADIOLOGY  None ____________________________________________   PROCEDURES  Procedure(s) performed: None  Critical Care  performed: No  ____________________________________________   INITIAL IMPRESSION / ASSESSMENT AND PLAN / ED COURSE  Pertinent labs & imaging results that were available during my care of the patient were reviewed by me and considered in my medical decision making (see chart for details).  This is a 29 year old female who comes with left upper quadrant pain. I did give the patient a dose of GI cocktail as well as Fioricet for her headache. The patient reports that she feels improved but is unsure exactly what is causing the symptoms. I informed her that she could have some gastritis or GERD that could be causing her symptoms that she should follow-up with her primary care doctor. The patient's pain is  not reproducible with palpation and she reports it is mild and dull. I will discharge the patient home to have her follow-up with [facility]. ____________________________________________   FINAL CLINICAL IMPRESSION(S) / ED DIAGNOSES  Final diagnoses:  Left upper quadrant pain  Acute nonintractable headache, unspecified headache type      Rebecka Apley, MD 10/07/15 [id number]

## 2015-11-01 ENCOUNTER — Encounter: Payer: Self-pay | Admitting: Emergency Medicine

## 2015-11-01 ENCOUNTER — Emergency Department: Payer: BLUE CROSS/BLUE SHIELD

## 2015-11-01 ENCOUNTER — Emergency Department
Admission: EM | Admit: 2015-11-01 | Discharge: 2015-11-01 | Disposition: A | Discharge status: Home / Self Care | Payer: BLUE CROSS/BLUE SHIELD | Attending: Emergency Medicine | Admitting: Emergency Medicine

## 2015-11-01 DIAGNOSIS — Z79899 Other long term (current) drug therapy: Secondary | ICD-10-CM | POA: Diagnosis not present

## 2015-11-01 DIAGNOSIS — J069 Acute upper respiratory infection, unspecified: Secondary | ICD-10-CM | POA: Diagnosis not present

## 2015-11-01 DIAGNOSIS — R05 Cough: Secondary | ICD-10-CM | POA: Diagnosis present

## 2015-11-01 MED ORDER — GUAIFENESIN-CODEINE 100-10 MG/5ML PO SOLN: 10.0000 mL | ORAL | Status: DC | PRN

## 2015-11-01 MED ORDER — AZITHROMYCIN 250 MG PO TABS: ORAL_TABLET | ORAL | Status: DC

## 2015-11-01 NOTE — Discharge Instructions (Signed)
Upper Respiratory Infection, Adult Most upper respiratory infections (URIs) are a viral infection of the air passages leading to the lungs. A URI affects the nose, throat, and upper air passages. The most common type of URI is nasopharyngitis and is typically referred to as "the common cold." URIs run their course and usually go away on their own. Most of the time, a URI does not require medical attention, but sometimes a bacterial infection in the upper airways can follow a viral infection. This is called a secondary infection. Sinus and middle ear infections are common types of secondary upper respiratory infections. Bacterial pneumonia can also complicate a URI. A URI can worsen asthma and chronic obstructive pulmonary disease (COPD). Sometimes, these complications can require emergency medical care and may be life threatening.  CAUSES Almost all URIs are caused by viruses. A virus is a type of germ and can spread from one person to another.  RISKS FACTORS You may be at risk for a URI if:   You smoke.   You have chronic heart or lung disease.  You have a weakened defense (immune) system.   You are very young or very old.   You have nasal allergies or asthma.  You work in crowded or poorly ventilated areas.  You work in health care facilities or schools. SIGNS AND SYMPTOMS  Symptoms typically develop 2-3 days after you come in contact with a cold virus. Most viral URIs last 7-10 days. However, viral URIs from the influenza virus (flu virus) can last 14-18 days and are typically more severe. Symptoms may include:   Runny or stuffy (congested) nose.   Sneezing.   Cough.   Sore throat.   Headache.   Fatigue.   Fever.   Loss of appetite.   Pain in your forehead, behind your eyes, and over your cheekbones (sinus pain).  Muscle aches.  DIAGNOSIS  Your health care provider may diagnose a URI by:  Physical exam.  Tests to check that your symptoms are not due to  another condition such as:  Strep throat.  Sinusitis.  Pneumonia.  Asthma. TREATMENT  A URI goes away on its own with time. It cannot be cured with medicines, but medicines may be prescribed or recommended to relieve symptoms. Medicines may help:  Reduce your fever.  Reduce your cough.  Relieve nasal congestion. HOME CARE INSTRUCTIONS   Take medicines only as directed by your health care provider.   Gargle warm saltwater or take cough drops to comfort your throat as directed by your health care provider.  Use a warm mist humidifier or inhale steam from a shower to increase air moisture. This may make it easier to breathe.  Drink enough fluid to keep your urine clear or pale yellow.   Eat soups and other clear broths and maintain good nutrition.   Rest as needed.   Return to work when your temperature has returned to normal or as your health care provider advises. You may need to stay home longer to avoid infecting others. You can also use a face mask and careful hand washing to prevent spread of the virus.  Increase the usage of your inhaler if you have asthma.   Do not use any tobacco products, including cigarettes, chewing tobacco, or electronic cigarettes. If you need help quitting, ask your health care provider. PREVENTION  The best way to protect yourself from getting a cold is to practice good hygiene.   Avoid oral or hand contact with people with cold   symptoms.   Wash your hands often if contact occurs.  There is no clear evidence that vitamin C, vitamin E, echinacea, or exercise reduces the chance of developing a cold. However, it is always recommended to get plenty of rest, exercise, and practice good nutrition.  SEEK MEDICAL CARE IF:   You are getting worse rather than better.   Your symptoms are not controlled by medicine.   You have chills.  You have worsening shortness of breath.  You have brown or red mucus.  You have yellow or brown nasal  discharge.  You have pain in your face, especially when you bend forward.  You have a fever.  You have swollen neck glands.  You have pain while swallowing.  You have white areas in the back of your throat. SEEK IMMEDIATE MEDICAL CARE IF:   You have severe or persistent:  Headache.  Ear pain.  Sinus pain.  Chest pain.  You have chronic lung disease and any of the following:  Wheezing.  Prolonged cough.  Coughing up blood.  A change in your usual mucus.  You have a stiff neck.  You have changes in your:  Vision.  Hearing.  Thinking.  Mood. MAKE SURE YOU:   Understand these instructions.  Will watch your condition.  Will get help right away if you are not doing well or get worse.   This information is not intended to replace advice given to you by your health care provider. Make sure you discuss any questions you have with your health care provider.   Document Released: 05/03/2001 Document Revised: 03/24/2015 Document Reviewed: 02/12/2014 Elsevier Interactive Patient Education 2016 [employer]  

## 2015-11-01 NOTE — ED Notes (Signed)
NAD noted at time of D/C. Pt denies questions or concerns. Pt ambulatory to the lobby at this time.  

## 2015-11-01 NOTE — ED Notes (Signed)
C/o cough, nasal congestion x 2 weeks

## 2015-11-01 NOTE — ED Provider Notes (Signed)
[facility] Emergency Department Provider Note  ____________________________________________  Time seen: Approximately 2:39 PM  I have reviewed the triage vital signs and the nursing notes.   HISTORY  Chief Complaint Cough and Nasal Congestion   HPI Kristie Carena Stream is a 29 y.o. female who presents for evaluation of cough and nasal congestion for 2 weeks. Patient states that she was seen here a month ago for abdominal pain and thinks that she got sick while she was here the last time. States that the cough is productive green causes increased pressure in her head when she coughs.   Past Medical History  Diagnosis Date  . PTSD (post-traumatic stress disorder) 2016  . Anxiety   . Panic attack     There are no active problems to display for this patient.   Past Surgical History  Procedure Laterality Date  . Appendectomy      Current Outpatient Rx  Name  Route  Sig  Dispense  Refill  . azithromycin (ZITHROMAX Z-PAK) 250 MG tablet      Take 2 tablets (500 mg) on  Day 1,  followed by 1 tablet (250 mg) once daily on Days 2 through 5.   6 each   0   . butalbital-acetaminophen-caffeine (FIORICET) 50-325-40 MG tablet   Oral   Take 1-2 tablets by mouth every 6 (six) hours as needed for headache.   20 tablet   0   . escitalopram (LEXAPRO) 10 MG tablet   Oral   Take 5 mg by mouth daily.          . famotidine (PEPCID) 40 MG tablet   Oral   Take 1 tablet (40 mg total) by mouth every evening.   15 tablet   0   . guaiFENesin-codeine 100-10 MG/5ML syrup   Oral   Take 10 mLs by mouth every 4 (four) hours as needed for cough.   180 mL   0     Allergies Asa and Reglan  No family history on file.  Social History Social History  Substance Use Topics  . Smoking status: Never Smoker   . Smokeless tobacco: Never Used  . Alcohol Use: Yes     Comment: occ    Review of Systems Constitutional: Positive for fever/chills Eyes: No visual  changes. ENT: Positive for sore throat. Cardiovascular: Denies chest pain. Respiratory: Denies shortness of breath. Positive for cough. Gastrointestinal: No abdominal pain.  No nausea, no vomiting.  No diarrhea.  No constipation. Genitourinary: Negative for dysuria. Musculoskeletal: Negative for back pain. Skin: Negative for rash. Neurological: Negative for headaches, focal weakness or numbness.  10-point ROS otherwise negative.  ____________________________________________   PHYSICAL EXAM:  VITAL SIGNS: ED Triage Vitals  Enc Vitals Group     BP 11/01/15 1403 123/79 mmHg     Pulse Rate 11/01/15 1403 99     Resp 11/01/15 1403 18     Temp 11/01/15 1403 99.3 F (37.4 C)     Temp Source 11/01/15 1403 Oral     SpO2 11/01/15 1403 99 %     Weight 11/01/15 1403 261 lb (118.389 kg)     Height 11/01/15 1403  (1.702 m)     Head Cir --      Peak Flow --      Pain Score 11/01/15 1404 0     Pain Loc --      Pain Edu? --      Excl. in GC? --  Constitutional: Alert and oriented. Well appearing and in no acute distress. Eyes: Conjunctivae are normal. PERRL. EOMI. Head: Atraumatic. Nose: Minimal congestion/rhinnorhea. Mouth/Throat: Mucous membranes are moist.  Oropharynx non-erythematous. Neck: No stridor.   Cardiovascular: Normal rate, regular rhythm. Grossly normal heart sounds.  Good peripheral circulation. Respiratory: Normal respiratory effort.  No retractions. Lungs CTAB. Musculoskeletal: No lower extremity tenderness nor edema.  No joint effusions. Neurologic:  Normal speech and language. No gross focal neurologic deficits are appreciated. No gait instability. Skin:  Skin is warm, dry and intact. No rash noted. Psychiatric: Mood and affect are normal. Speech and behavior are normal.  ____________________________________________   LABS (all labs ordered are listed, but only abnormal results are displayed)  Labs Reviewed - No data to  display ____________________________________________   RADIOLOGY  No acute pulmonary findings. Negative for pneumonia or atelectasis. ____________________________________________   PROCEDURES  Procedure(s) performed: None  Critical Care performed: No  ____________________________________________   INITIAL IMPRESSION / ASSESSMENT AND PLAN / ED COURSE  Pertinent labs & imaging results that were available during my care of the patient were reviewed by me and considered in my medical decision making (see chart for details).  Acute upper respiratory infection. Rx given for Zithromax and Robitussin-AC. Patient follow-up PCP or return here with any worsening symptomology. ____________________________________________   FINAL CLINICAL IMPRESSION(S) / ED DIAGNOSES  Final diagnoses:  URI, acute      Evangeline Dakinharles M Beers, PA-C 11/01/15 1552  Phineas SemenGraydon Goodman, MD 11/01/15 [phone]

## 2015-11-28 ENCOUNTER — Emergency Department: Payer: BLUE CROSS/BLUE SHIELD

## 2015-11-28 ENCOUNTER — Emergency Department
Admission: EM | Admit: 2015-11-28 | Discharge: 2015-11-28 | Disposition: A | Discharge status: Home / Self Care | Payer: BLUE CROSS/BLUE SHIELD | Attending: Emergency Medicine | Admitting: Emergency Medicine

## 2015-11-28 DIAGNOSIS — R252 Cramp and spasm: Secondary | ICD-10-CM | POA: Diagnosis not present

## 2015-11-28 DIAGNOSIS — F419 Anxiety disorder, unspecified: Secondary | ICD-10-CM | POA: Insufficient documentation

## 2015-11-28 DIAGNOSIS — R002 Palpitations: Secondary | ICD-10-CM | POA: Diagnosis not present

## 2015-11-28 DIAGNOSIS — Z975 Presence of (intrauterine) contraceptive device: Secondary | ICD-10-CM | POA: Diagnosis not present

## 2015-11-28 DIAGNOSIS — Z3202 Encounter for pregnancy test, result negative: Secondary | ICD-10-CM | POA: Diagnosis not present

## 2015-11-28 DIAGNOSIS — R450 Nervousness: Secondary | ICD-10-CM | POA: Diagnosis not present

## 2015-11-28 DIAGNOSIS — Z79899 Other long term (current) drug therapy: Secondary | ICD-10-CM | POA: Diagnosis not present

## 2015-11-28 DIAGNOSIS — R197 Diarrhea, unspecified: Secondary | ICD-10-CM | POA: Diagnosis not present

## 2015-11-28 DIAGNOSIS — R112 Nausea with vomiting, unspecified: Secondary | ICD-10-CM | POA: Insufficient documentation

## 2015-11-28 DIAGNOSIS — R000 Tachycardia, unspecified: Secondary | ICD-10-CM

## 2015-11-28 LAB — CBC WITH DIFFERENTIAL/PLATELET
Basophils Absolute: 0 10*3/uL (ref 0–0.1)
Basophils Relative: 1 %
Eosinophils Absolute: 0.1 10*3/uL (ref 0–0.7)
Eosinophils Relative: 1 %
HCT: 37 % (ref 35.0–47.0)
Hemoglobin: 12 g/dL (ref 12.0–16.0)
Lymphocytes Relative: 28 %
Lymphs Abs: 1.4 10*3/uL (ref 1.0–3.6)
MCH: 26.3 pg (ref 26.0–34.0)
MCHC: 32.4 g/dL (ref 32.0–36.0)
MCV: 81.2 fL (ref 80.0–100.0)
Monocytes Absolute: 0.3 10*3/uL (ref 0.2–0.9)
Monocytes Relative: 6 %
Neutro Abs: 3.2 10*3/uL (ref 1.4–6.5)
Neutrophils Relative %: 64 %
Platelets: 196 10*3/uL (ref 150–440)
RBC: 4.55 MIL/uL (ref 3.80–5.20)
RDW: 13.7 % (ref 11.5–14.5)
WBC: 5 10*3/uL (ref 3.6–11.0)

## 2015-11-28 LAB — URINALYSIS COMPLETE WITH MICROSCOPIC (ARMC ONLY)
Bacteria, UA: NONE SEEN
Bilirubin Urine: NEGATIVE
Glucose, UA: NEGATIVE mg/dL
Leukocytes, UA: NEGATIVE
Nitrite: NEGATIVE
Protein, ur: NEGATIVE mg/dL
Specific Gravity, Urine: 1.033 — ABNORMAL HIGH (ref 1.005–1.030)
pH: 6 (ref 5.0–8.0)

## 2015-11-28 LAB — BASIC METABOLIC PANEL
Anion gap: 7 (ref 5–15)
BUN: 15 mg/dL (ref 6–20)
CO2: 21 mmol/L — ABNORMAL LOW (ref 22–32)
Calcium: 8.8 mg/dL — ABNORMAL LOW (ref 8.9–10.3)
Chloride: 110 mmol/L (ref 101–111)
Creatinine, Ser: 0.73 mg/dL (ref 0.44–1.00)
GFR calc Af Amer: >60 mL/min (ref >60)
GFR calc non Af Amer: >60 mL/min (ref >60)
Glucose, Bld: 127 mg/dL — ABNORMAL HIGH (ref 65–99)
Potassium: 3.5 mmol/L (ref 3.5–5.1)
Sodium: 138 mmol/L (ref 135–145)

## 2015-11-28 LAB — POCT PREGNANCY, URINE: Preg Test, Ur: NEGATIVE

## 2015-11-28 LAB — FIBRIN DERIVATIVES D-DIMER (ARMC ONLY): Fibrin derivatives D-dimer (ARMC): 1042 — ABNORMAL HIGH (ref 0–499)

## 2015-11-28 MED ORDER — ONDANSETRON 8 MG PO TBDP
8.0000 mg | ORAL_TABLET | Freq: Once | ORAL | Status: AC
  Administered 2015-11-28: 8 mg via ORAL
  Filled 2015-11-28: qty 1

## 2015-11-28 MED ORDER — SODIUM CHLORIDE 0.9 % IV BOLUS (SEPSIS)
1000.0000 mL | Freq: Once | INTRAVENOUS | Status: AC
  Administered 2015-11-28: 1000 mL via INTRAVENOUS

## 2015-11-28 MED ORDER — IOHEXOL 350 MG/ML SOLN
75.0000 mL | Freq: Once | INTRAVENOUS | Status: AC | PRN
  Administered 2015-11-28: 75 mL via INTRAVENOUS

## 2015-11-28 MED ORDER — ONDANSETRON HCL 4 MG PO TABS: 4.0000 mg | ORAL_TABLET | Freq: Three times a day (TID) | ORAL | Status: DC | PRN

## 2015-11-28 MED ORDER — LORAZEPAM 2 MG/ML IJ SOLN
1.0000 mg | Freq: Once | INTRAMUSCULAR | Status: AC
  Administered 2015-11-28: 1 mg via INTRAVENOUS
  Filled 2015-11-28: qty 1

## 2015-11-28 NOTE — ED Notes (Signed)
Patient transported to CT 

## 2015-11-28 NOTE — ED Provider Notes (Signed)
-----------------------------------------   6:37 PM on 11/28/2015 -----------------------------------------  Patient's d-dimer was elevated however CT scan does not show any pulmonary embolism. This point patient's tachycardia did does seem to improve improved. She is however still tachycardic into the 110s on my evaluation. I had a discussion with the patient. I did offer further observation and further IV fluid hydration given that the patient self stated she thought she might of been dehydrated. However the patient stated she would rather go home at this point. This point while I wish we had gotten the tachycardia fully resolved I do think this is a relatively safe plan. No signs of pulmonary embolism. No leukocytosis, fever or other indication of infection. Did discuss with patient importance of following up with primary care doctors. Will provide numbers for outpatient clinics.  Phineas SemenGraydon Tashawn Laswell, MD 11/28/15 [phone]

## 2015-11-28 NOTE — ED Provider Notes (Addendum)
[facility] Emergency Department Provider Note  ____________________________________________  Time seen: 1305   I have reviewed the triage vital signs and the nursing notes.  History by:  Patient  HISTORY  Chief Complaint Nausea and Emesis     HPI Jasmine Gordon is a 30 y.o. female who reports she has had nausea today and has not tolerated fluids by mouth well. She reports feeling anxious and she reports she has some palpitations in her chest. She does report having a loose stool today. She denies any abdominal pain.  Patient is concerned that this may be due to a recent IUD placement. She last received an injection of Depo-Provera approximately 3 months ago. It was the only injection she received. She decided to use an IUD instead. She reports she had some mild cramping for the 2 days after the placement of the IUD but she has no abdominal pain now. She denies any fever.  The patient denies any sick contacts. She does report an extensive history with anxiety.    Past Medical History  Diagnosis Date  . PTSD (post-traumatic stress disorder) 2016  . Anxiety   . Panic attack     There are no active problems to display for this patient.   Past Surgical History  Procedure Laterality Date  . Appendectomy      Current Outpatient Rx  Name  Route  Sig  Dispense  Refill  . azithromycin (ZITHROMAX Z-PAK) 250 MG tablet      Take 2 tablets (500 mg) on  Day 1,  followed by 1 tablet (250 mg) once daily on Days 2 through 5.   6 each   0   . butalbital-acetaminophen-caffeine (FIORICET) 50-325-40 MG tablet   Oral   Take 1-2 tablets by mouth every 6 (six) hours as needed for headache.   20 tablet   0   . escitalopram (LEXAPRO) 10 MG tablet   Oral   Take 5 mg by mouth daily.          . famotidine (PEPCID) 40 MG tablet   Oral   Take 1 tablet (40 mg total) by mouth every evening.   15 tablet   0   . guaiFENesin-codeine 100-10 MG/5ML syrup  Oral   Take 10 mLs by mouth every 4 (four) hours as needed for cough.   180 mL   0     Allergies Asa and Reglan  No family history on file.  Social History Social History  Substance Use Topics  . Smoking status: Never Smoker   . Smokeless tobacco: Never Used  . Alcohol Use: Yes     Comment: occ    Review of Systems  Constitutional: Negative for fever/chills. ENT: Negative for congestion. Cardiovascular: Palpitations. (Tachycardic on arrival). Respiratory: Negative for cough. Gastrointestinal: Negative for abdominal pain. Positive for nausea with some loose stool. Genitourinary: Negative for dysuria. Musculoskeletal: No back pain. Skin: Negative for rash. Neurological: Negative for headache or focal weakness Psychiatric: History of anxiety. Patient reports feeling anxious today.  10-point ROS otherwise negative.  ____________________________________________   PHYSICAL EXAM:  VITAL SIGNS: ED Triage Vitals  Enc Vitals Group     BP 11/28/15 1257 138/78 mmHg     Pulse Rate 11/28/15 1257 130     Resp 11/28/15 1257 16     Temp --      Temp src --      SpO2 11/28/15 1257 98 %     Weight 11/28/15 1257 264 lb (119.75  kg)     Height 11/28/15 1257 5\' 7"  (1.702 m)     Head Cir --      Peak Flow --      Pain Score 11/28/15 1257 0     Pain Loc --      Pain Edu? --      Excl. in GC? --     Constitutional:  Alert and oriented, appears a little jittery and anxious. Tachycardic at 125-135. ENT   Head: Normocephalic and atraumatic.   Nose: No congestion/rhinnorhea.       Mouth: No erythema, no swelling   Cardiovascular: Tachycardic at 125-135, regular rhythm, no murmur noted Respiratory:  Normal respiratory effort, no tachypnea.    Breath sounds are clear and equal bilaterally.  Gastrointestinal: Soft, no distention. Nontender Back: No muscle spasm, no tenderness, no CVA tenderness. Musculoskeletal: No deformity noted. Nontender with normal range of motion in  all extremities.  No noted edema. Neurologic:  Communicative. Normal appearing spontaneous movement in all 4 extremities. No gross focal neurologic deficits are appreciated.  Skin:  Skin is warm, dry. No rash noted. Psychiatric: Patient appears anxious, nervous. She is alert and oriented. With further discussion, she starts to cry and report "I'm scared, him scared". ____________________________________________    LABS (pertinent positives/negatives)  Labs Reviewed  BASIC METABOLIC PANEL - Abnormal; Notable for the following:    CO2 21 (*)    Glucose, Bld 127 (*)    Calcium 8.8 (*)    All other components within normal limits  URINALYSIS COMPLETEWITH MICROSCOPIC ([facility] ONLY) - Abnormal; Notable for the following:    Color, Urine YELLOW (*)    APPearance CLEAR (*)    Ketones, ur TRACE (*)    Specific Gravity, Urine 1.033 (*)    Hgb urine dipstick 1+ (*)    Squamous Epithelial / LPF 0-5 (*)    All other components within normal limits  CBC WITH DIFFERENTIAL/PLATELET  POC URINE PREG, ED  POCT PREGNANCY, URINE     ____________________________________________   EKG  ED ECG REPORT I, Marquice Uddin W, the attending physician, personally viewed and interpreted this ECG.   Date: 11/28/2015  EKG Time: 1522  Rate: 129  Rhythm: Sinus tachycardia  Axis: Normal  Intervals: QTC of 573  ST&T Change: None noted   ____________________________________________    RADIOLOGY  Chest x-ray: Pending  ____________________________________________   PROCEDURES    ____________________________________________   INITIAL IMPRESSION / ASSESSMENT AND PLAN / ED COURSE  Pertinent labs & imaging results that were available during my care of the patient were reviewed by me and considered in my medical decision making (see chart for details).  Anxious [age] female with some nausea and vomiting. Clinically she overall looks well except for her anxiety and her heart rate at 125-135. She  has no history of clotting or DVTs. She has no unilateral swelling. She has no tenderness in her lower abdomen though be worrisome following the recent IUD placement.  We are treating her with antinausea medicine. We will give her 1 L of normal saline and Ativan for anxiety. We will check a blood count due to the noted tachycardia and reassess her heart rate after fluids and medication.  ----------------------------------------- 3:13 PM on 11/28/2015 -----------------------------------------  Lab results are within normal limits. She is not pregnant. Does not urinary tract infection. She is not anemic and his normal white blood cell count. Electrolytes are all reasonable with no sign of dehydration.  On reexamination, patient appears more calm,  however she continues to be tachycardic. With no explanation for the tachycardia, I will add on a d-dimer and asked my colleague Dr. Derrill Kay, to follow-up on this lab and reassess the patient. I will also order a chest x-ray.  ____________________________________________   FINAL CLINICAL IMPRESSION(S) / ED DIAGNOSES  Final diagnoses:  Nausea and vomiting, vomiting of unspecified type  Anxiety  Tachycardia      Darien Ramus, MD 11/28/15 1518  Darien Ramus, MD 11/28/15 1531

## 2015-11-28 NOTE — Discharge Instructions (Signed)
Please seek medical attention for any high fevers, chest pain, shortness of breath, change in behavior, persistent vomiting, bloody stool or any other new or concerning symptoms. ° ° °Nausea and Vomiting °Nausea is a sick feeling that often comes before throwing up (vomiting). Vomiting is a reflex where stomach contents come out of your mouth. Vomiting can cause severe loss of body fluids (dehydration). Children and elderly adults can become dehydrated quickly, especially if they also have diarrhea. Nausea and vomiting are symptoms of a condition or disease. It is important to find the cause of your symptoms. °CAUSES  °· Direct irritation of the stomach lining. This irritation can result from increased acid production (gastroesophageal reflux disease), infection, food poisoning, taking certain medicines (such as nonsteroidal anti-inflammatory drugs), alcohol use, or tobacco use. °· Signals from the brain. These signals could be caused by a headache, heat exposure, an inner ear disturbance, increased pressure in the brain from injury, infection, a tumor, or a concussion, pain, emotional stimulus, or metabolic problems. °· An obstruction in the gastrointestinal tract (bowel obstruction). °· Illnesses such as diabetes, hepatitis, gallbladder problems, appendicitis, kidney problems, cancer, sepsis, atypical symptoms of a heart attack, or eating disorders. °· Medical treatments such as chemotherapy and radiation. °· Receiving medicine that makes you sleep (general anesthetic) during surgery. °DIAGNOSIS °Your caregiver may ask for tests to be done if the problems do not improve after a few days. Tests may also be done if symptoms are severe or if the reason for the nausea and vomiting is not clear. Tests may include: °· Urine tests. °· Blood tests. °· Stool tests. °· Cultures (to look for evidence of infection). °· X-rays or other imaging studies. °Test results can help your caregiver make decisions about treatment or the  need for additional tests. °TREATMENT °You need to stay well hydrated. Drink frequently but in small amounts. You may wish to drink water, sports drinks, clear broth, or eat frozen ice pops or gelatin dessert to help stay hydrated. When you eat, eating slowly may help prevent nausea. There are also some antinausea medicines that may help prevent nausea. °HOME CARE INSTRUCTIONS  °· Take all medicine as directed by your caregiver. °· If you do not have an appetite, do not force yourself to eat. However, you must continue to drink fluids. °· If you have an appetite, eat a normal diet unless your caregiver tells you differently. °¨ Eat a variety of complex carbohydrates (rice, wheat, potatoes, bread), lean meats, yogurt, fruits, and vegetables. °¨ Avoid high-fat foods because they are more difficult to digest. °· Drink enough water and fluids to keep your urine clear or pale yellow. °· If you are dehydrated, ask your caregiver for specific rehydration instructions. Signs of dehydration may include: °¨ Severe thirst. °¨ Dry lips and mouth. °¨ Dizziness. °¨ Dark urine. °¨ Decreasing urine frequency and amount. °¨ Confusion. °¨ Rapid breathing or pulse. °SEEK IMMEDIATE MEDICAL CARE IF:  °· You have blood or brown flecks (like coffee grounds) in your vomit. °· You have black or bloody stools. °· You have a severe headache or stiff neck. °· You are confused. °· You have severe abdominal pain. °· You have chest pain or trouble breathing. °· You do not urinate at least once every 8 hours. °· You develop cold or clammy skin. °· You continue to vomit for longer than 24 to 48 hours. °· You have a fever. °MAKE SURE YOU:  °· Understand these instructions. °· Will watch your condition. °· Will get help right away   if you are not doing well or get worse. °  °This information is not intended to replace advice given to you by your health care provider. Make sure you discuss any questions you have with your health care provider. °    °Document Released: 11/07/2005 Document Revised: 01/30/2012 Document Reviewed: 04/06/2011 °Elsevier Interactive Patient Education ©2016 [employer] ° °

## 2015-11-28 NOTE — ED Notes (Signed)
Pt from home via EMS, states she switched her birth control from depo shot to paraguard IUD. C/O nausea and vomiting this morning, feels "jittery like heart is racing"

## 2015-12-28 ENCOUNTER — Emergency Department
Admission: EM | Admit: 2015-12-28 | Discharge: 2015-12-28 | Disposition: A | Discharge status: Home / Self Care | Payer: BLUE CROSS/BLUE SHIELD | Attending: Emergency Medicine | Admitting: Emergency Medicine

## 2015-12-28 DIAGNOSIS — R002 Palpitations: Secondary | ICD-10-CM | POA: Diagnosis present

## 2015-12-28 DIAGNOSIS — R0602 Shortness of breath: Secondary | ICD-10-CM | POA: Insufficient documentation

## 2015-12-28 DIAGNOSIS — Z79899 Other long term (current) drug therapy: Secondary | ICD-10-CM | POA: Diagnosis not present

## 2015-12-28 DIAGNOSIS — Z792 Long term (current) use of antibiotics: Secondary | ICD-10-CM | POA: Insufficient documentation

## 2015-12-28 LAB — CBC
HCT: 35.9 % (ref 35.0–47.0)
Hemoglobin: 11.7 g/dL — ABNORMAL LOW (ref 12.0–16.0)
MCH: 26.4 pg (ref 26.0–34.0)
MCHC: 32.5 g/dL (ref 32.0–36.0)
MCV: 81 fL (ref 80.0–100.0)
Platelets: 186 10*3/uL (ref 150–440)
RBC: 4.43 MIL/uL (ref 3.80–5.20)
RDW: 13.6 % (ref 11.5–14.5)
WBC: 4.4 10*3/uL (ref 3.6–11.0)

## 2015-12-28 LAB — COMPREHENSIVE METABOLIC PANEL
ALT: 12 U/L — ABNORMAL LOW (ref 14–54)
AST: 17 U/L (ref 15–41)
Albumin: 3.7 g/dL (ref 3.5–5.0)
Alkaline Phosphatase: 46 U/L (ref 38–126)
Anion gap: 6 (ref 5–15)
BUN: 13 mg/dL (ref 6–20)
CO2: 22 mmol/L (ref 22–32)
Calcium: 8.9 mg/dL (ref 8.9–10.3)
Chloride: 110 mmol/L (ref 101–111)
Creatinine, Ser: 0.73 mg/dL (ref 0.44–1.00)
GFR calc Af Amer: >60 mL/min (ref >60)
GFR calc non Af Amer: >60 mL/min (ref >60)
Glucose, Bld: 106 mg/dL — ABNORMAL HIGH (ref 65–99)
Potassium: 3.5 mmol/L (ref 3.5–5.1)
Sodium: 138 mmol/L (ref 135–145)
Total Bilirubin: 0.3 mg/dL (ref 0.3–1.2)
Total Protein: 6.9 g/dL (ref 6.5–8.1)

## 2015-12-28 LAB — TROPONIN I: Troponin I: <0.03 ng/mL (ref <0.031)

## 2015-12-28 MED ORDER — ONDANSETRON 4 MG PO TBDP
ORAL_TABLET | ORAL | Status: AC
  Administered 2015-12-28: 4 mg via ORAL
  Filled 2015-12-28: qty 1

## 2015-12-28 MED ORDER — LORAZEPAM 0.5 MG PO TABS: 0.5000 mg | ORAL_TABLET | Freq: Three times a day (TID) | ORAL | Status: DC | PRN

## 2015-12-28 MED ORDER — ONDANSETRON 4 MG PO TBDP
4.0000 mg | ORAL_TABLET | Freq: Once | ORAL | Status: AC
  Administered 2015-12-28: 4 mg via ORAL

## 2015-12-28 NOTE — ED Notes (Signed)
Pt reports SOB and palpitations x 2 days . Pt reports she has also been anxious.

## 2015-12-28 NOTE — Discharge Instructions (Signed)

## 2015-12-28 NOTE — ED Notes (Signed)
Pt informed to return if any life threatening symptoms occur.  

## 2015-12-28 NOTE — ED Provider Notes (Signed)
[facility] Emergency Department Provider Note  Time seen: 7:52 AM  I have reviewed the triage vital signs and the nursing notes.   HISTORY  Chief Complaint Shortness of Breath and Palpitations    HPI Jasmine Gordon is a 30 y.o. female with a past medical history of PTSD, anxiety, panic attacks who presents the emergency department with palpitations, and "just not feeling right." According to the patient she woke this morning with occasional heart racing sensation. Patient states she occasionally feels that her heart is racing due to her underlying anxiety disorder however in addition to the heart racing she also felt generalized weakness and nausea, states she just didn't feel right so she came to the emergency department for evaluation. Patient states a history of tachycardia due to her anxiety. Denies any chest pain or abdominal pain. Describes her nausea and weakness is mild.     Past Medical History  Diagnosis Date  . PTSD (post-traumatic stress disorder) 2016  . Anxiety   . Panic attack     There are no active problems to display for this patient.   Past Surgical History  Procedure Laterality Date  . Appendectomy      Current Outpatient Rx  Name  Route  Sig  Dispense  Refill  . azithromycin (ZITHROMAX Z-PAK) 250 MG tablet      Take 2 tablets (500 mg) on  Day 1,  followed by 1 tablet (250 mg) once daily on Days 2 through 5.   6 each   0   . butalbital-acetaminophen-caffeine (FIORICET) 50-325-40 MG tablet   Oral   Take 1-2 tablets by mouth every 6 (six) hours as needed for headache.   20 tablet   0   . escitalopram (LEXAPRO) 10 MG tablet   Oral   Take 5 mg by mouth daily.          . famotidine (PEPCID) 40 MG tablet   Oral   Take 1 tablet (40 mg total) by mouth every evening.   15 tablet   0   . guaiFENesin-codeine 100-10 MG/5ML syrup   Oral   Take 10 mLs by mouth every 4 (four) hours as needed for cough.   180 mL   0    . ondansetron (ZOFRAN) 4 MG tablet   Oral   Take 1 tablet (4 mg total) by mouth every 8 (eight) hours as needed for nausea or vomiting.   20 tablet   0     Allergies Asa and Reglan  No family history on file.  Social History Social History  Substance Use Topics  . Smoking status: Never Smoker   . Smokeless tobacco: Never Used  . Alcohol Use: Yes     Comment: occ    Review of Systems Constitutional: Negative for fever. Cardiovascular: Negative for chest pain. Occasional sensation that her heart is racing. Respiratory: Negative for shortness of breath. Gastrointestinal: Negative for abdominal pain Neurological: Negative for headache 10-point ROS otherwise negative.  ____________________________________________   PHYSICAL EXAM:  VITAL SIGNS: ED Triage Vitals  Enc Vitals Group     BP 12/28/15 0738 134/96 mmHg     Pulse Rate 12/28/15 0738 115     Resp 12/28/15 0738 18     Temp 12/28/15 0738 98.5 F (36.9 C)     Temp src --      SpO2 12/28/15 0738 98 %     Weight 12/28/15 0738 259 lb (117.482 kg)     Height 12/28/15 0738   (1.702 m)     Head Cir --      Peak Flow --      Pain Score 12/28/15 0739 0     Pain Loc --      Pain Edu? --      Excl. in GC? --    Constitutional: Alert and oriented. Well appearing and in no distress. Eyes: Normal exam ENT   Head: Normocephalic and atraumatic.   Mouth/Throat: Mucous membranes are moist. Cardiovascular: Normal rate, regular rhythm. No murmur Respiratory: Normal respiratory effort without tachypnea nor retractions. Breath sounds are clear  Gastrointestinal: Soft and nontender. No distention. Musculoskeletal: Nontender with normal range of motion in all extremities. Neurologic:  Normal speech and language. No gross focal neurologic deficits Skin:  Skin is warm, dry and intact.  Psychiatric: Mood and affect are normal. Speech and behavior are normal.   ____________________________________________     EKG  EKG reviewed and interpreted by myself shows sinus tachycardia at 104 bpm, narrow QRS, normal axis, normal intervals, nonspecific ST changes without elevation.  ____________________________________________   INITIAL IMPRESSION / ASSESSMENT AND PLAN / ED COURSE  Pertinent labs & imaging results that were available during my care of the patient were reviewed by me and considered in my medical decision making (see chart for details).  Patient presents to the emergency department with occasional sensation of her heart racing, also stating that overall she just does not feel normal or well today. Patient is tachycardic around 115 bpm, admits to being very anxious right now. I reviewed the patient's old records she appears to be tachycardic at baseline which is possibly due to her anxiety. Patient had a CTA of her chest last month to rule out pulmonary emboli. We will check the patient's labs, EKG, and closely monitor in the emergency department.  Labs within normal limits, patient states she is feeling much better. Suspect anxiety. We'll discharge home with a short course of Ativan. Patient is follow up with primary care physician.  ____________________________________________   FINAL CLINICAL IMPRESSION(S) / ED DIAGNOSES  Palpitations   Minna Antis, MD 12/28/15 [phone]

## 2016-01-13 ENCOUNTER — Emergency Department: Payer: BLUE CROSS/BLUE SHIELD

## 2016-01-13 ENCOUNTER — Emergency Department
Admission: EM | Admit: 2016-01-13 | Discharge: 2016-01-13 | Disposition: A | Discharge status: Home / Self Care | Payer: BLUE CROSS/BLUE SHIELD | Attending: Emergency Medicine | Admitting: Emergency Medicine

## 2016-01-13 DIAGNOSIS — F410 Panic disorder [episodic paroxysmal anxiety] without agoraphobia: Secondary | ICD-10-CM | POA: Diagnosis not present

## 2016-01-13 DIAGNOSIS — F4310 Post-traumatic stress disorder, unspecified: Secondary | ICD-10-CM | POA: Insufficient documentation

## 2016-01-13 DIAGNOSIS — R05 Cough: Secondary | ICD-10-CM | POA: Diagnosis present

## 2016-01-13 DIAGNOSIS — F419 Anxiety disorder, unspecified: Secondary | ICD-10-CM

## 2016-01-13 DIAGNOSIS — Z79899 Other long term (current) drug therapy: Secondary | ICD-10-CM | POA: Insufficient documentation

## 2016-01-13 NOTE — ED Provider Notes (Signed)
[facility] Emergency Department Provider Note  ____________________________________________  Time seen: Approximately 8:04 AM  I have reviewed the triage vital signs and the nursing notes.   HISTORY  Chief Complaint Cough    HPI Clariece Sharin Altidor is a 30 y.o. female patient complaining of 2 weeks of cough and fatigue. Patient state her cough is getting better she still felt tightness and upper chest. Patient states she had discontinued her anxiety medicine because of the side effects. Patient also states that her cough is diminishing nonproductive and productive. Patient has not followed up with family clinic as directed for her anxiety complaint.   Past Medical History  Diagnosis Date  . PTSD (post-traumatic stress disorder) 2016  . Anxiety   . Panic attack     There are no active problems to display for this patient.   Past Surgical History  Procedure Laterality Date  . Appendectomy      Current Outpatient Rx  Name  Route  Sig  Dispense  Refill  . azithromycin (ZITHROMAX Z-PAK) 250 MG tablet      Take 2 tablets (500 mg) on  Day 1,  followed by 1 tablet (250 mg) once daily on Days 2 through 5. Patient not taking: Reported on 12/28/2015   6 each   0   . butalbital-acetaminophen-caffeine (FIORICET) 50-325-40 MG tablet   Oral   Take 1-2 tablets by mouth every 6 (six) hours as needed for headache. Patient not taking: Reported on 12/28/2015   20 tablet   0   . escitalopram (LEXAPRO) 10 MG tablet   Oral   Take 10 mg by mouth daily.          . famotidine (PEPCID) 40 MG tablet   Oral   Take 1 tablet (40 mg total) by mouth every evening. Patient not taking: Reported on 12/28/2015   15 tablet   0   . guaiFENesin-codeine 100-10 MG/5ML syrup   Oral   Take 10 mLs by mouth every 4 (four) hours as needed for cough. Patient not taking: Reported on 12/28/2015   180 mL   0   . LORazepam (ATIVAN) 0.5 MG tablet   Oral   Take 1 tablet (0.5 mg  total) by mouth every 8 (eight) hours as needed for anxiety.   30 tablet   0   . ondansetron (ZOFRAN) 4 MG tablet   Oral   Take 1 tablet (4 mg total) by mouth every 8 (eight) hours as needed for nausea or vomiting. Patient not taking: Reported on 12/28/2015   20 tablet   0     Allergies Asa and Reglan  No family history on file.  Social History Social History  Substance Use Topics  . Smoking status: Never Smoker   . Smokeless tobacco: Never Used  . Alcohol Use: Yes     Comment: occ    Review of Systems Constitutional: No fever/chills Eyes: No visual changes. ENT: No sore throat. Cardiovascular: Denies chest pain. Respiratory: Denies shortness of breath. Gastrointestinal: No abdominal pain.  No nausea, no vomiting.  No diarrhea.  No constipation. Genitourinary: Negative for dysuria. Musculoskeletal: Negative for back pain. Skin: Negative for rash. Neurological: Negative for headaches, focal weakness or numbness. Psychiatric:PTSD, panic attack and anxiety   ____________________________________________   PHYSICAL EXAM:  VITAL SIGNS: ED Triage Vitals  Enc Vitals Group     BP 01/13/16 0748 136/73 mmHg     Pulse Rate 01/13/16 0748 102     Resp 01/13/16 0748  18     Temp 01/13/16 0748 98 F (36.7 C)     Temp Source 01/13/16 0748 Oral     SpO2 01/13/16 0748 98 %     Weight 01/13/16 0748 254 lb (115.214 kg)     Height 01/13/16 0748  (1.702 m)     Head Cir --      Peak Flow --      Pain Score --      Pain Loc --      Pain Edu? --      Excl. in GC? --     Constitutional: Alert and oriented. Well appearing and in no acute distress. Appears anxious Eyes: Conjunctivae are normal. PERRL. EOMI. Head: Atraumatic. Nose: No congestion/rhinnorhea. Mouth/Throat: Mucous membranes are moist.  Oropharynx non-erythematous. Neck: No stridor.  No cervical spine tenderness to palpation. Cardiovascular: Normal rate, regular rhythm. Grossly normal heart sounds.  Good  peripheral circulation. Respiratory: Normal respiratory effort.  No retractions. Lungs CTAB. Gastrointestinal: Soft and nontender. No distention. No abdominal bruits. No CVA tenderness. Musculoskeletal: No lower extremity tenderness nor edema.  No joint effusions. Neurologic:  Normal speech and language. No gross focal neurologic deficits are appreciated. No gait instability. Skin:  Skin is warm, dry and intact. No rash noted. Psychiatric: Mood and affect are normal. Speech and behavior are normal.  ____________________________________________   LABS (all labs ordered are listed, but only abnormal results are displayed)  Labs Reviewed - No data to display ____________________________________________  EKG   ____________________________________________  RADIOLOGY  No acute findings on x-ray ____________________________________________   PROCEDURES  Procedure(s) performed: None  Critical Care performed: No  ____________________________________________   INITIAL IMPRESSION / ASSESSMENT AND PLAN / ED COURSE  Pertinent labs & imaging results that were available during my care of the patient were reviewed by me and considered in my medical decision making (see chart for details).  Anxiety. Discussed negative x-ray results. Advised patient to follow-up with the [facility] clinic for definitive evaluation and treatment for her anxiety. ____________________________________________   FINAL CLINICAL IMPRESSION(S) / ED DIAGNOSES  Final diagnoses:  Anxiety      Joni Reining, PA-C 01/13/16 1610  Darci Current, MD 01/13/16 [phone]

## 2016-01-13 NOTE — ED Notes (Signed)
Pt reports cough and chest congestion x2 weeks; reports improvement yesterday.

## 2016-01-13 NOTE — ED Notes (Signed)
Pt states shortness of breathe, states she has been off her anxiety medicine, states dry cough and feels like something is "being hung up in her throat", pt in no acute distress

## 2016-01-13 NOTE — Discharge Instructions (Signed)
Generalized Anxiety Disorder Generalized anxiety disorder (GAD) is a mental disorder. It interferes with life functions, including relationships, work, and school. GAD is different from normal anxiety, which everyone experiences at some point in their lives in response to specific life events and activities. Normal anxiety actually helps us prepare for and get through these life events and activities. Normal anxiety goes away after the event or activity is over.  GAD causes anxiety that is not necessarily related to specific events or activities. It also causes excess anxiety in proportion to specific events or activities. The anxiety associated with GAD is also difficult to control. GAD can vary from mild to severe. People with severe GAD can have intense waves of anxiety with physical symptoms (panic attacks).  SYMPTOMS The anxiety and worry associated with GAD are difficult to control. This anxiety and worry are related to many life events and activities and also occur more days than not for 6 months or longer. People with GAD also have three or more of the following symptoms (one or more in children):  Restlessness.   Fatigue.  Difficulty concentrating.   Irritability.  Muscle tension.  Difficulty sleeping or unsatisfying sleep. DIAGNOSIS GAD is diagnosed through an assessment by your health care provider. Your health care provider will ask you questions aboutyour mood,physical symptoms, and events in your life. Your health care provider may ask you about your medical history and use of alcohol or drugs, including prescription medicines. Your health care provider may also do a physical exam and blood tests. Certain medical conditions and the use of certain substances can cause symptoms similar to those associated with GAD. Your health care provider may refer you to a mental health specialist for further evaluation. TREATMENT The following therapies are usually used to treat GAD:    Medication. Antidepressant medication usually is prescribed for long-term daily control. Antianxiety medicines may be added in severe cases, especially when panic attacks occur.   Talk therapy (psychotherapy). Certain types of talk therapy can be helpful in treating GAD by providing support, education, and guidance. A form of talk therapy called cognitive behavioral therapy can teach you healthy ways to think about and react to daily life events and activities.  Stress managementtechniques. These include yoga, meditation, and exercise and can be very helpful when they are practiced regularly. A mental health specialist can help determine which treatment is best for you. Some people see improvement with one therapy. However, other people require a combination of therapies.   This information is not intended to replace advice given to you by your health care provider. Make sure you discuss any questions you have with your health care provider.   Document Released: 03/04/2013 Document Revised: 11/28/2014 Document Reviewed: 03/04/2013 Elsevier Interactive Patient Education 2016 [employer] Inc.  

## 2016-01-18 ENCOUNTER — Encounter: Payer: Self-pay | Admitting: Emergency Medicine

## 2016-01-18 ENCOUNTER — Emergency Department
Admission: EM | Admit: 2016-01-18 | Discharge: 2016-01-18 | Disposition: A | Discharge status: Home / Self Care | Payer: BLUE CROSS/BLUE SHIELD | Attending: Student | Admitting: Student

## 2016-01-18 DIAGNOSIS — Z79899 Other long term (current) drug therapy: Secondary | ICD-10-CM | POA: Diagnosis not present

## 2016-01-18 DIAGNOSIS — K1120 Sialoadenitis, unspecified: Secondary | ICD-10-CM | POA: Diagnosis not present

## 2016-01-18 DIAGNOSIS — Z792 Long term (current) use of antibiotics: Secondary | ICD-10-CM | POA: Insufficient documentation

## 2016-01-18 DIAGNOSIS — K115 Sialolithiasis: Secondary | ICD-10-CM | POA: Diagnosis not present

## 2016-01-18 DIAGNOSIS — H9203 Otalgia, bilateral: Secondary | ICD-10-CM | POA: Diagnosis present

## 2016-01-18 MED ORDER — CLINDAMYCIN HCL 300 MG PO CAPS: 300.0000 mg | ORAL_CAPSULE | Freq: Three times a day (TID) | ORAL | Status: DC

## 2016-01-18 NOTE — ED Notes (Signed)
Clogged feeling in her right ear, tingling feeling in her mouth, states she has pressure in the right side of her face.

## 2016-01-18 NOTE — ED Provider Notes (Signed)
[facility] Emergency Department Provider Note ____________________________________________  Time seen: 2115  I have reviewed the triage vital signs and the nursing notes.  HISTORY  Chief Complaint  Otalgia  HPI Jasmine Gordon is a 30 y.o. female since to the ED with symptoms including right greater than left otalgia for the last 2 days. She denies any sinus symptoms, or URI symptoms in the interim. She describes pain localized deep to the ear as well as some discomfort around the angle of the jaw. She denies any vertigo, dizziness, ear drainage, fevers, chills, sweats. She is also aware of some fullness to lower jaw as well as what she describes as a bump inside the cheek on the right side.She rates her discomfort to her face on the right at 8/10 in triage. She denies any dental pain, or purulent discharge into the mouth.  Past Medical History  Diagnosis Date  . PTSD (post-traumatic stress disorder) 2016  . Anxiety   . Panic attack     There are no active problems to display for this patient.   Past Surgical History  Procedure Laterality Date  . Appendectomy      Current Outpatient Rx  Name  Route  Sig  Dispense  Refill  . azithromycin (ZITHROMAX Z-PAK) 250 MG tablet      Take 2 tablets (500 mg) on  Day 1,  followed by 1 tablet (250 mg) once daily on Days 2 through 5. Patient not taking: Reported on 12/28/2015   6 each   0   . butalbital-acetaminophen-caffeine (FIORICET) 50-325-40 MG tablet   Oral   Take 1-2 tablets by mouth every 6 (six) hours as needed for headache. Patient not taking: Reported on 12/28/2015   20 tablet   0   . clindamycin (CLEOCIN) 300 MG capsule   Oral   Take 1 capsule (300 mg total) by mouth 3 (three) times daily.   30 capsule   0   . escitalopram (LEXAPRO) 10 MG tablet   Oral   Take 10 mg by mouth daily.          . famotidine (PEPCID) 40 MG tablet   Oral   Take 1 tablet (40 mg total) by mouth every  evening. Patient not taking: Reported on 12/28/2015   15 tablet   0   . guaiFENesin-codeine 100-10 MG/5ML syrup   Oral   Take 10 mLs by mouth every 4 (four) hours as needed for cough. Patient not taking: Reported on 12/28/2015   180 mL   0   . LORazepam (ATIVAN) 0.5 MG tablet   Oral   Take 1 tablet (0.5 mg total) by mouth every 8 (eight) hours as needed for anxiety.   30 tablet   0   . ondansetron (ZOFRAN) 4 MG tablet   Oral   Take 1 tablet (4 mg total) by mouth every 8 (eight) hours as needed for nausea or vomiting. Patient not taking: Reported on 12/28/2015   20 tablet   0    Allergies Asa and Reglan  No family history on file.  Social History Social History  Substance Use Topics  . Smoking status: Never Smoker   . Smokeless tobacco: Never Used  . Alcohol Use: Yes     Comment: occ   Review of Systems  Constitutional: Negative for fever. Eyes: Negative for visual changes. ENT: Negative for sore throat. Reports otalgia as above Cardiovascular: Negative for chest pain. Respiratory: Negative for shortness of breath. Gastrointestinal: Negative for  abdominal pain, vomiting and diarrhea. Skin: Negative for rash. Neurological: Negative for headaches, focal weakness or numbness. ____________________________________________  PHYSICAL EXAM:  VITAL SIGNS: ED Triage Vitals  Enc Vitals Group     BP 01/18/16 2035 123/81 mmHg     Pulse Rate 01/18/16 2035 96     Resp 01/18/16 2035 18     Temp 01/18/16 2035 99.1 F (37.3 C)     Temp Source 01/18/16 2035 Oral     SpO2 01/18/16 2035 100 %     Weight 01/18/16 2035 254 lb (115.214 kg)     Height 01/18/16 2035  (1.702 m)     Head Cir --      Peak Flow --      Pain Score 01/18/16 2035 8     Pain Loc --      Pain Edu? --      Excl. in GC? --    Constitutional: Alert and oriented. Well appearing and in no distress. Head: Normocephalic and atraumatic.      Eyes: Conjunctivae are normal. PERRL. Normal extraocular  movements      Ears: Canals clear. TMs intact bilaterally. Mild serous effusion to the left ear.    Nose: No congestion/rhinorrhea.   Mouth/Throat: Mucous membranes are moist. Uvula is midline and tonsils are flat. Patient with a prominent Stensen's duct on the right cheek.   Neck: Supple. No thyromegaly. Hematological/Lymphatic/Immunological: No cervical lymphadenopathy. Cardiovascular: Normal rate, regular rhythm.  Respiratory: Normal respiratory effort. No wheezes/rales/rhonchi. Gastrointestinal: Soft and nontender. No distention. Musculoskeletal: Nontender with normal range of motion in all extremities.  Neurologic:  Normal gait without ataxia. Normal speech and language. No gross focal neurologic deficits are appreciated. Skin:  Skin is warm, dry and intact. No rash noted. Psychiatric: Mood and affect are normal. Patient exhibits appropriate insight and judgment. ____________________________________________  INITIAL IMPRESSION / ASSESSMENT AND PLAN / ED COURSE  Patient with a clinical presentation that appears to be consistent with a salivary gland stone on the right parotid. No indication of swelling or mumps on examination. She does appear to have a firm Stensen's duct which may represent a small stone blockage. She is advised on treatment with size, warm compresses, and sialolugues. She is provided a prescription for clindamycin, empirically. There is no immediate presentation to concern for infectious process. She is scheduled to see her dental provider in 3 days. Follow-up there as appropriate. Return precautions are reviewed.  ____________________________________________  FINAL CLINICAL IMPRESSION(S) / ED DIAGNOSES  Final diagnoses:  Stone of salivary gland or duct  Parotitis not due to mumps      Lissa Hoard, PA-C 01/18/16 2238  Gayla Doss, MD 01/18/16 2351

## 2016-01-18 NOTE — Discharge Instructions (Signed)
Parotitis Parotitis is soreness and inflammation of one or both parotid glands. The parotid glands produce saliva. They are located on each side of the face, below and in front of the earlobes. The saliva produced comes out of tiny openings (ducts) inside the cheeks. In most cases, parotitis goes away over time or with treatment. If your parotitis is caused by certain long-term (chronic) diseases, it may come back again.  CAUSES  Parotitis can be caused by:  Viral infections. Mumps is one viral infection that can cause parotitis.  Bacterial infections.  Blockage of the salivary ducts due to a salivary stone.  Narrowing of the salivary ducts.  Swelling of the salivary ducts.  Dehydration.  Autoimmune conditions, such as sarcoidosis or Sjogren syndrome.  Air from activities such as scuba diving, glass blowing, or playing an instrument (rare).  Human immunodeficiency virus (HIV) or acquired immunodeficiency syndrome (AIDS).  Tuberculosis. SIGNS AND SYMPTOMS   The ears may appear to be pushed up and out from their normal position.  Redness (erythema) of the skin over the parotid glands.  Pain and tenderness over the parotid glands.  Swelling in the parotid gland area.  Yellowish-white fluid (pus) coming from the ducts inside the cheeks.  Dry mouth.  Bad taste in the mouth. DIAGNOSIS  Your health care provider may determine that you have parotitis based on your symptoms and a physical exam. A sample of fluid may also be taken from the parotid gland and tested to find the cause of your infection. X-rays or computed tomography (CT) scans may be taken if your health care provider thinks you might have a salivary stone blocking your salivary duct. TREATMENT  Treatment varies depending upon the cause of your parotitis. If your parotitis is caused by mumps, no treatment is needed. The condition will go away on its own after 7 to 10 days. In other cases, treatment may  include:  Antibiotic medicine if your infection was caused by bacteria.  Pain medicines.  Gland massage.  Eating sour candy to increase your saliva production.  Removal of salivary stones. Your health care provider may flush stones out with fluids or remove them with tweezers.  Surgery to remove the parotid glands. HOME CARE INSTRUCTIONS   If you were prescribed an antibiotic medicine, finish it all even if you start to feel better.  Put warm compresses on the sore area.  Take medicines only as directed by your health care provider.  Drink enough fluids to keep your urine clear or pale yellow. SEEK IMMEDIATE MEDICAL CARE IF:   You have increasing pain or swelling that is not controlled with medicine.  You have a fever. MAKE SURE YOU:  Understand these instructions.  Will watch your condition.  Will get help right away if you are not doing well or get worse.   This information is not intended to replace advice given to you by your health care provider. Make sure you discuss any questions you have with your health care provider.   Document Released: 04/29/2002 Document Revised: 11/28/2014 Document Reviewed: 04/02/2015 Elsevier Interactive Patient Education [employer]  Take the prescription antibiotic as directed. Apply warm compresses to the gland to promote expression of the stone. Eat sour candies (LemonHeads) to make the salivary gland squeeze. Follow-up with your dental provider on Thursday, as scheduled.

## 2016-01-23 ENCOUNTER — Encounter: Payer: Self-pay | Admitting: Emergency Medicine

## 2016-01-23 ENCOUNTER — Emergency Department
Admission: EM | Admit: 2016-01-23 | Discharge: 2016-01-23 | Disposition: A | Discharge status: Home / Self Care | Payer: BLUE CROSS/BLUE SHIELD | Attending: Emergency Medicine | Admitting: Emergency Medicine

## 2016-01-23 DIAGNOSIS — Z3202 Encounter for pregnancy test, result negative: Secondary | ICD-10-CM | POA: Insufficient documentation

## 2016-01-23 DIAGNOSIS — R42 Dizziness and giddiness: Secondary | ICD-10-CM | POA: Diagnosis present

## 2016-01-23 DIAGNOSIS — F410 Panic disorder [episodic paroxysmal anxiety] without agoraphobia: Secondary | ICD-10-CM | POA: Diagnosis not present

## 2016-01-23 DIAGNOSIS — Z79899 Other long term (current) drug therapy: Secondary | ICD-10-CM | POA: Diagnosis not present

## 2016-01-23 DIAGNOSIS — R000 Tachycardia, unspecified: Secondary | ICD-10-CM | POA: Diagnosis not present

## 2016-01-23 DIAGNOSIS — Z792 Long term (current) use of antibiotics: Secondary | ICD-10-CM | POA: Diagnosis not present

## 2016-01-23 LAB — POCT PREGNANCY, URINE: Preg Test, Ur: NEGATIVE

## 2016-01-23 LAB — GLUCOSE, CAPILLARY: Glucose-Capillary: 89 mg/dL (ref 65–99)

## 2016-01-23 MED ORDER — SODIUM CHLORIDE 0.9 % IV BOLUS (SEPSIS)
1000.0000 mL | Freq: Once | INTRAVENOUS | Status: AC
  Administered 2016-01-23: 1000 mL via INTRAVENOUS

## 2016-01-23 NOTE — Discharge Instructions (Signed)
Please seek medical attention for any high fevers, chest pain, shortness of breath, change in behavior, persistent vomiting, bloody stool or any other new or concerning symptoms.   Panic Attacks Panic attacks are sudden, short-livedsurges of severe anxiety, fear, or discomfort. They may occur for no reason when you are relaxed, when you are anxious, or when you are sleeping. Panic attacks may occur for a number of reasons:   Healthy people occasionally have panic attacks in extreme, life-threatening situations, such as war or natural disasters. Normal anxiety is a protective mechanism of the body that helps us react to danger (fight or flight response).  Panic attacks are often seen with anxiety disorders, such as panic disorder, social anxiety disorder, generalized anxiety disorder, and phobias. Anxiety disorders cause excessive or uncontrollable anxiety. They may interfere with your relationships or other life activities.  Panic attacks are sometimes seen with other mental illnesses, such as depression and posttraumatic stress disorder.  Certain medical conditions, prescription medicines, and drugs of abuse can cause panic attacks. SYMPTOMS  Panic attacks start suddenly, peak within 20 minutes, and are accompanied by four or more of the following symptoms:  Pounding heart or fast heart rate (palpitations).  Sweating.  Trembling or shaking.  Shortness of breath or feeling smothered.  Feeling choked.  Chest pain or discomfort.  Nausea or strange feeling in your stomach.  Dizziness, light-headedness, or feeling like you will faint.  Chills or hot flushes.  Numbness or tingling in your lips or hands and feet.  Feeling that things are not real or feeling that you are not yourself.  Fear of losing control or going crazy.  Fear of dying. Some of these symptoms can mimic serious medical conditions. For example, you may think you are having a heart attack. Although panic attacks  can be very scary, they are not life threatening. DIAGNOSIS  Panic attacks are diagnosed through an assessment by your health care provider. Your health care provider will ask questions about your symptoms, such as where and when they occurred. Your health care provider will also ask about your medical history and use of alcohol and drugs, including prescription medicines. Your health care provider may order blood tests or other studies to rule out a serious medical condition. Your health care provider may refer you to a mental health professional for further evaluation. TREATMENT   Most healthy people who have one or two panic attacks in an extreme, life-threatening situation will not require treatment.  The treatment for panic attacks associated with anxiety disorders or other mental illness typically involves counseling with a mental health professional, medicine, or a combination of both. Your health care provider will help determine what treatment is best for you.  Panic attacks due to physical illness usually go away with treatment of the illness. If prescription medicine is causing panic attacks, talk with your health care provider about stopping the medicine, decreasing the dose, or substituting another medicine.  Panic attacks due to alcohol or drug abuse go away with abstinence. Some adults need professional help in order to stop drinking or using drugs. HOME CARE INSTRUCTIONS   Take all medicines as directed by your health care provider.   Schedule and attend follow-up visits as directed by your health care provider. It is important to keep all your appointments. SEEK MEDICAL CARE IF:  You are not able to take your medicines as prescribed.  Your symptoms do not improve or get worse. SEEK IMMEDIATE MEDICAL CARE IF:   You experience  panic attack symptoms that are different than your usual symptoms. °· You have serious thoughts about hurting yourself or others. °· You are taking  medicine for panic attacks and have a serious side effect. °MAKE SURE YOU: °· Understand these instructions. °· Will watch your condition. °· Will get help right away if you are not doing well or get worse. °  °This information is not intended to replace advice given to you by your health care provider. Make sure you discuss any questions you have with your health care provider. °  °Document Released: 11/07/2005 Document Revised: 11/12/2013 Document Reviewed: 06/21/2013 °Elsevier Interactive Patient Education ©2016 [employer] ° °

## 2016-01-23 NOTE — ED Notes (Addendum)
Pt to ed with c/o "I feel numb all over, my whole body feels like it is going to shut down"  Pt states she feels like she is going to faint while sitting in chair.  Pt crying at triage, hx of anxiety and panic attacks,  Pt states she feels worse than when she has a panic attack.

## 2016-01-23 NOTE — ED Notes (Signed)
Pt also reports she has a copper IUD that was placed in January.

## 2016-01-23 NOTE — ED Provider Notes (Signed)
[facility] Emergency Department Provider Note    ____________________________________________  Time seen: ~1505  I have reviewed the triage vital signs and the nursing notes.   HISTORY  Chief Complaint Dizziness   History limited by: Not Limited   HPI Jasmine Gordon is a 30 y.o. female who presents to the emergency department today is of concerns for fast heart rate, numbness and anxiety. Patient states that she has been quite anxious recently secondary to a test that she had to take today. She states that shortly after taking the test she started feeling bad. She stated she had associated symptoms of fast heart rate, numbness, breathing difficulty. The patient states that by the time of my examination she had felt much more calm. She feels like she might of had a panic attack. She states she does have a history of anxiety and has seen providers for this in the past. She states they have not been able find a medication that helps with her. [facility]. She denies any pain today. She denies any recent fevers, nausea vomiting or abdominal pain.    Past Medical History  Diagnosis Date  . PTSD (post-traumatic stress disorder) 2016  . Anxiety   . Panic attack     There are no active problems to display for this patient.   Past Surgical History  Procedure Laterality Date  . Appendectomy      Current Outpatient Rx  Name  Route  Sig  Dispense  Refill  . azithromycin (ZITHROMAX Z-PAK) 250 MG tablet      Take 2 tablets (500 mg) on  Day 1,  followed by 1 tablet (250 mg) once daily on Days 2 through 5. Patient not taking: Reported on 12/28/2015   6 each   0   . butalbital-acetaminophen-caffeine (FIORICET) 50-325-40 MG tablet   Oral   Take 1-2 tablets by mouth every 6 (six) hours as needed for headache. Patient not taking: Reported on 12/28/2015   20 tablet   0   . clindamycin (CLEOCIN) 300 MG capsule   Oral   Take 1 capsule (300  mg total) by mouth 3 (three) times daily.   30 capsule   0   . escitalopram (LEXAPRO) 10 MG tablet   Oral   Take 10 mg by mouth daily.          . famotidine (PEPCID) 40 MG tablet   Oral   Take 1 tablet (40 mg total) by mouth every evening. Patient not taking: Reported on 12/28/2015   15 tablet   0   . guaiFENesin-codeine 100-10 MG/5ML syrup   Oral   Take 10 mLs by mouth every 4 (four) hours as needed for cough. Patient not taking: Reported on 12/28/2015   180 mL   0   . LORazepam (ATIVAN) 0.5 MG tablet   Oral   Take 1 tablet (0.5 mg total) by mouth every 8 (eight) hours as needed for anxiety.   30 tablet   0   . ondansetron (ZOFRAN) 4 MG tablet   Oral   Take 1 tablet (4 mg total) by mouth every 8 (eight) hours as needed for nausea or vomiting. Patient not taking: Reported on 12/28/2015   20 tablet   0     Allergies Asa and Reglan  History reviewed. No pertinent family history.  Social History Social History  Substance Use Topics  . Smoking status: Never Smoker   . Smokeless tobacco: Never Used  .  Alcohol Use: Yes     Comment: occ    Review of Systems  Constitutional: Negative for fever. Cardiovascular: Negative for chest pain. Positive for fast heart rate Respiratory: Negative for shortness of breath. Gastrointestinal: Negative for abdominal pain, vomiting and diarrhea. Neurological: Negative for headaches, focal weakness or numbness.   10-point ROS otherwise negative.  ____________________________________________   PHYSICAL EXAM:  VITAL SIGNS: ED Triage Vitals  Enc Vitals Group     BP 01/23/16 1352 143/44 mmHg     Pulse Rate 01/23/16 1352 129     Resp 01/23/16 1352 20     Temp 01/23/16 1352 98.1 F (36.7 C)     Temp Source 01/23/16 1352 Oral     SpO2 01/23/16 1352 100 %     Weight 01/23/16 1352 264 lb (119.75 kg)     Height 01/23/16 1352  (1.702 m)     Head Cir --      Peak Flow --      Pain Score 01/23/16 1353 0   Constitutional:  Alert and oriented. Well appearing and in no distress. Eyes: Conjunctivae are normal. PERRL. Normal extraocular movements. ENT   Head: Normocephalic and atraumatic.   Nose: No congestion/rhinnorhea.   Mouth/Throat: Mucous membranes are moist.   Neck: No stridor. Hematological/Lymphatic/Immunilogical: No cervical lymphadenopathy. Cardiovascular: Tachycardia, regular rhythm.  No murmurs, rubs, or gallops. Respiratory: Normal respiratory effort without tachypnea nor retractions. Breath sounds are clear and equal bilaterally. No wheezes/rales/rhonchi. Gastrointestinal: Soft and nontender. No distention. There is no CVA tenderness. Genitourinary: Deferred Musculoskeletal: Normal range of motion in all extremities. No joint effusions.  No lower extremity tenderness nor edema. Neurologic:  Normal speech and language. No gross focal neurologic deficits are appreciated.  Skin:  Skin is warm, dry and intact. No rash noted. Psychiatric: Mood and affect are normal. Speech and behavior are normal. Patient exhibits appropriate insight and judgment.  ____________________________________________    LABS (pertinent positives/negatives)  Labs Reviewed  GLUCOSE, CAPILLARY  POCT PREGNANCY, URINE     ____________________________________________   EKG  I, Phineas Semen, attending physician, personally viewed and interpreted this EKG  EKG Time: 1359 Rate: 142 Rhythm: sinus tachycardia Axis: normal Intervals: qtc 513 QRS: narrow ST changes: no st elevation Impression: abnormal ekg ____________________________________________    RADIOLOGY  None   ____________________________________________   PROCEDURES  Procedure(s) performed: None  Critical Care performed: No  ____________________________________________   INITIAL IMPRESSION / ASSESSMENT AND PLAN / ED COURSE  Pertinent labs & imaging results that were available during my care of the patient were reviewed by me  and considered in my medical decision making (see chart for details).  Patient presented to the emergency department today because of concerns for feeling unwell, fast heart rate and likely panic attack. Patient's heart rate was elevated as was given a liter of IV fluids. This did help bring the heart rate down somewhat. Patient has had issues with fast heart rate in the past and has even undergone CT angiogram to evaluate for PE which was negative. At this point I think very unlikely patient having a PE given lack of current shortness of breath or chest pain. Do not feel patient would benefit from further PE workup at this time given negative CT scan in the past. Discussed with patient importance of following up with primary care for further anxiety treatment.  ____________________________________________   FINAL CLINICAL IMPRESSION(S) / ED DIAGNOSES  Final diagnoses:  Panic attack  Tachycardia     Phineas Semen, MD  01/23/16 1640 

## 2016-01-23 NOTE — ED Notes (Addendum)
Pt states her sxs started last night, she felt like she was having anxiety.  She was driving and felt like she was going to pass out. She c/o having a migraine headaches which her headache feels like a normal headache.  She reports recently passing a test for work that she took this morning they she had been excited about. She reports she started feeling like this after her test while driving.  Pt did take 2 Tylenol at 1230  and Clindamycin this morning for dental issue.

## 2016-01-23 NOTE — ED Notes (Signed)
Pt does report having an anxiety disorder and was on Lexapro which she stopped taking over a month ago due to making her sick. Pt has taken Ativan in the past which made her feel "really bad"

## 2016-01-29 ENCOUNTER — Encounter: Payer: Self-pay | Admitting: Emergency Medicine

## 2016-01-29 ENCOUNTER — Emergency Department
Admission: EM | Admit: 2016-01-29 | Discharge: 2016-01-29 | Disposition: A | Discharge status: Home / Self Care | Payer: BLUE CROSS/BLUE SHIELD | Attending: Student | Admitting: Student

## 2016-01-29 DIAGNOSIS — Z79899 Other long term (current) drug therapy: Secondary | ICD-10-CM | POA: Diagnosis not present

## 2016-01-29 DIAGNOSIS — F419 Anxiety disorder, unspecified: Secondary | ICD-10-CM | POA: Insufficient documentation

## 2016-01-29 DIAGNOSIS — Z792 Long term (current) use of antibiotics: Secondary | ICD-10-CM | POA: Insufficient documentation

## 2016-01-29 MED ORDER — CLONAZEPAM 0.5 MG PO TABS
0.2500 mg | ORAL_TABLET | Freq: Once | ORAL | Status: AC
  Administered 2016-01-29: 0.25 mg via ORAL
  Filled 2016-01-29: qty 1

## 2016-01-29 MED ORDER — CLONAZEPAM 0.5 MG PO TABS: 0.2500 mg | ORAL_TABLET | Freq: Two times a day (BID) | ORAL | Status: DC | PRN

## 2016-01-29 NOTE — ED Notes (Signed)
Patient has appt on Tuesday to have medications changed.

## 2016-01-29 NOTE — ED Provider Notes (Signed)
[facility] Emergency Department Provider Note  ____________________________________________  Time seen: Approximately 5:40 PM  I have reviewed the triage vital signs and the nursing notes.   HISTORY  Chief Complaint Anxiety    HPI Jasmine Gordon is a 30 y.o. female with history of anxiety and panic attacks who presents for evaluation of anxiety with specific request for medication change. Her anxiety has been chronic but worsening over the past 1-2 weeks, gradual onset, intermittent, currently moderate to severe. Patient reports that she has been battling anxiety for several months. She is followed by [facility]. They started her on Lexapro in the past however she quit taking it because she was having some numbness in her arms that resolved when she stopped taking the medication. She was seen here on 01/23/2016 for anxiety-related complaints and given some IV fluids for tachycardia, she was discharged with sections to follow-up with her outpatient mental health provider. She saw them 2 days ago at which time she restarted her Lexapro however reports that she is intermittently again had some numbness in bilateral hands/wrists and she does not want to take that medication any longer. She is requesting a medication change for treatment of her anxiety. She reports that when she has an anxiety attack, they usually occur in the car, she feels tight in her neck, she feels as if the walls are closing in on her and she develops some shortness of breath. This is consistent with her usual panic attacks. No SI, HI or audiovisual hallucinations. No chest pain, no recent illness including no cough, sneezing, runny nose, congestion, vomiting, diarrhea, fevers or chills.   Of note, the patient was seen for tachycardia, shortness of breath, and anxious feeling on 11/28/2015, had an elevated d-dimer and a negative CTA chest at that time. No evidence of pulmonary embolism. Her symptoms  were thought to be secondary to anxiety.   Past Medical History  Diagnosis Date  . PTSD (post-traumatic stress disorder) 2016  . Anxiety   . Panic attack     There are no active problems to display for this patient.   Past Surgical History  Procedure Laterality Date  . Appendectomy      Current Outpatient Rx  Name  Route  Sig  Dispense  Refill  . azithromycin (ZITHROMAX Z-PAK) 250 MG tablet      Take 2 tablets (500 mg) on  Day 1,  followed by 1 tablet (250 mg) once daily on Days 2 through 5. Patient not taking: Reported on 12/28/2015   6 each   0   . butalbital-acetaminophen-caffeine (FIORICET) 50-325-40 MG tablet   Oral   Take 1-2 tablets by mouth every 6 (six) hours as needed for headache. Patient not taking: Reported on 12/28/2015   20 tablet   0   . clindamycin (CLEOCIN) 300 MG capsule   Oral   Take 1 capsule (300 mg total) by mouth 3 (three) times daily.   30 capsule   0   . escitalopram (LEXAPRO) 10 MG tablet   Oral   Take 10 mg by mouth daily.          . famotidine (PEPCID) 40 MG tablet   Oral   Take 1 tablet (40 mg total) by mouth every evening. Patient not taking: Reported on 12/28/2015   15 tablet   0   . guaiFENesin-codeine 100-10 MG/5ML syrup   Oral   Take 10 mLs by mouth every 4 (four) hours as needed for cough. Patient  not taking: Reported on 12/28/2015   180 mL   0   . LORazepam (ATIVAN) 0.5 MG tablet   Oral   Take 1 tablet (0.5 mg total) by mouth every 8 (eight) hours as needed for anxiety.   30 tablet   0   . ondansetron (ZOFRAN) 4 MG tablet   Oral   Take 1 tablet (4 mg total) by mouth every 8 (eight) hours as needed for nausea or vomiting. Patient not taking: Reported on 12/28/2015   20 tablet   0     Allergies Asa and Reglan  No family history on file.  Social History Social History  Substance Use Topics  . Smoking status: Never Smoker   . Smokeless tobacco: Never Used  . Alcohol Use: Yes     Comment: occ    Review of  Systems Constitutional: No fever/chills Eyes: No visual changes. ENT: No sore throat. Cardiovascular: Denies chest pain. Respiratory: Denies shortness of breath curently. Gastrointestinal: No abdominal pain.  No nausea, no vomiting.  No diarrhea.  No constipation. Genitourinary: Negative for dysuria. Musculoskeletal: Negative for back pain. Skin: Negative for rash. Neurological: Negative for headaches, focal weakness or numbness.  10-point ROS otherwise negative.  ____________________________________________   PHYSICAL EXAM:  VITAL SIGNS: ED Triage Vitals  Enc Vitals Group     BP 01/29/16 1709 110/58 mmHg     Pulse Rate 01/29/16 1709 88     Resp 01/29/16 1709 22     Temp 01/29/16 1709 99.1 F (37.3 C)     Temp Source 01/29/16 1709 Oral     SpO2 01/29/16 1709 100 %     Weight 01/29/16 1709 250 lb 8 oz (113.626 kg)     Height 01/29/16 1709  (1.702 m)     Head Cir --      Peak Flow --      Pain Score 01/29/16 1711 8     Pain Loc --      Pain Edu? --      Excl. in GC? --     Constitutional: Alert and oriented. Well appearing and in no acute distress. Sitting up in bed, texting on her cell phone. Eyes: Conjunctivae are normal. PERRL. EOMI. Head: Atraumatic. Nose: No congestion/rhinnorhea. Mouth/Throat: Mucous membranes are moist.  Oropharynx non-erythematous. Neck: No stridor.  Supple without meningismus. Cardiovascular: Normal rate, regular rhythm. Grossly normal heart sounds.  Good peripheral circulation. Respiratory: Normal respiratory effort.  No retractions. Lungs CTAB. Gastrointestinal: Soft and nontender. No distention.  No CVA tenderness. Genitourinary: deferred Musculoskeletal: No lower extremity tenderness nor edema.  No joint effusions. Neurologic:  Normal speech and language. No gross focal neurologic deficits are appreciated. No gait instability. 5 out of 5 strength in bilateral upper and lower extremities, sensation intact to light touch  throughout. Skin:  Skin is warm, dry and intact. No rash noted. Psychiatric: Mood and affect are normal. Speech and behavior are normal.  ____________________________________________   LABS (all labs ordered are listed, but only abnormal results are displayed)  Labs Reviewed - No data to display ____________________________________________  EKG  none ____________________________________________  RADIOLOGY  none ____________________________________________   PROCEDURES  Procedure(s) performed: None  Critical Care performed: No  ____________________________________________   INITIAL IMPRESSION / ASSESSMENT AND PLAN / ED COURSE  Pertinent labs & imaging results that were available during my care of the patient were reviewed by me and considered in my medical decision making (see chart for details).  Jasmine Gordon is a 30  y.o. female with history of anxiety and panic attacks who presents for evaluation of anxiety with specific request for medication change. On exam, she is well-appearing and in no acute distress. Vital signs stable, she is afebrile. She is benign physical examination. No SI, HI or out of visual hallucinations. I discussed with her that she needs to follow-up with [facility] during her scheduled appointment (already scheduled for 4 days from now) and that they can make medication adjustments at that time. I did offer her a very short course of Klonopin for severe anxiety. She feels better after 0.25 mg of Klonopin here in the emergency department. She has stable vital signs, chronic complaints, and on exam, no recent illness and I do not see any indication for labs or imaging at this time. Given recent workup for PE which was negative, I doubt that this represents the cause of her complaints this evening as I suspect they are all anxiety related. We discussed return precautions, knee for close follow-up and she is comfortable with the discharge plan. DC  home. ____________________________________________   FINAL CLINICAL IMPRESSION(S) / ED DIAGNOSES  Final diagnoses:  Anxiety      Gayla DossEryka A Davidson Palmieri, MD 01/29/16 [phone]

## 2016-01-29 NOTE — ED Notes (Addendum)
Patient reports having anxiety attack. Patient states she had panic attack two weeks ago and has not felt herself since then. Reports tension in her shoulders. Has extensive history of anxiety and has been on multiple medications. States she was on Zoloft years ago, that may have worked better. States she feel like she can eat or drink d/t anxiety.

## 2016-02-12 ENCOUNTER — Encounter: Payer: Self-pay | Admitting: Emergency Medicine

## 2016-02-12 ENCOUNTER — Emergency Department
Admission: EM | Admit: 2016-02-12 | Discharge: 2016-02-12 | Disposition: A | Discharge status: Home / Self Care | Payer: BLUE CROSS/BLUE SHIELD | Attending: Emergency Medicine | Admitting: Emergency Medicine

## 2016-02-12 DIAGNOSIS — J209 Acute bronchitis, unspecified: Secondary | ICD-10-CM | POA: Diagnosis not present

## 2016-02-12 DIAGNOSIS — R05 Cough: Secondary | ICD-10-CM | POA: Diagnosis present

## 2016-02-12 MED ORDER — AZITHROMYCIN 250 MG PO TABS: ORAL_TABLET | ORAL | Status: DC

## 2016-02-12 MED ORDER — ALBUTEROL SULFATE HFA 108 (90 BASE) MCG/ACT IN AERS: 1.0000 | INHALATION_SPRAY | Freq: Four times a day (QID) | RESPIRATORY_TRACT | Status: DC | PRN

## 2016-02-12 NOTE — ED Notes (Signed)
See triage. States she had body aches with fever for several days .Marland Kitchen. Sx's eased off but returned yesterday  Cough is occasionally prod

## 2016-02-12 NOTE — ED Provider Notes (Signed)
[facility] Emergency Department Provider Note  ____________________________________________  Time seen: Approximately 12:18 PM  I have reviewed the triage vital signs and the nursing notes.   HISTORY  Chief Complaint Cough and Shortness of Breath    HPI Jasmine Gordon is a 30 y.o. female , NAD, presents to the emergency department with 1 week history of cough, chest congestion and shortness of breath. States she has been ill for approximately one week. Illness began with fever, body aches and nasal congestion. Cough was dry until the last couple of days which now she has had some productive cough. Does note she has been exposed to family members with positive influenza test and believes she contracted it in that fashion. Has been taking Alka-Seltzer cold and flu over-the-counter but stopped taking that medication and she was having some upset stomach. Currently denies any abdominal pain, nausea, vomiting, diarrhea. Has not noted any wheezing, headaches, ear pain, sore throat. Denies chest pain, visual changes, numbness, weakness, tingling.   Past Medical History  Diagnosis Date  . PTSD (post-traumatic stress disorder) 2016  . Anxiety   . Panic attack     There are no active problems to display for this patient.   Past Surgical History  Procedure Laterality Date  . Appendectomy      Current Outpatient Rx  Name  Route  Sig  Dispense  Refill  . albuterol (PROVENTIL HFA;VENTOLIN HFA) 108 (90 Base) MCG/ACT inhaler   Inhalation   Inhale 1-2 puffs into the lungs every 6 (six) hours as needed for wheezing or shortness of breath.   1 Inhaler   0   . azithromycin (ZITHROMAX Z-PAK) 250 MG tablet      Take 2 tablets (500 mg) on  Day 1,  followed by 1 tablet (250 mg) once daily on Days 2 through 5.   6 each   0   . clindamycin (CLEOCIN) 300 MG capsule   Oral   Take 1 capsule (300 mg total) by mouth 3 (three) times daily.   30 capsule   0   .  clonazePAM (KLONOPIN) 0.5 MG tablet   Oral   Take 0.5 tablets (0.25 mg total) by mouth 2 (two) times daily as needed for anxiety.   3 tablet   0   . escitalopram (LEXAPRO) 10 MG tablet   Oral   Take 10 mg by mouth daily.            Allergies Asa and Reglan  No family history on file.  Social History Social History  Substance Use Topics  . Smoking status: Never Smoker   . Smokeless tobacco: Never Used  . Alcohol Use: Yes     Comment: occ     Review of Systems  Constitutional: Positive fever, chills that have resolved. Positive fatigue Eyes: No visual changes. No discharge, redness, swelling ENT: No sore throat, nasal congestion, sinus pressure. Cardiovascular: No chest pain. Respiratory: Positive chest congestion, productive cough. Positive shortness of breath. No wheezing.  Gastrointestinal: No abdominal pain.  No nausea, vomiting.  No diarrhea.   Musculoskeletal: Positive general myalgias that have resolved. Negative for back pain.  Skin: Negative for rash. Neurological: Negative for headaches, focal weakness or numbness. 10-point ROS otherwise negative.  ____________________________________________   PHYSICAL EXAM:  VITAL SIGNS: ED Triage Vitals  Enc Vitals Group     BP 02/12/16 0953 120/78 mmHg     Pulse Rate 02/12/16 0953 95     Resp 02/12/16 0953 18  Temp 02/12/16 0953 98.9 F (37.2 C)     Temp Source 02/12/16 0953 Oral     SpO2 02/12/16 0953 99 %     Weight 02/12/16 0953 240 lb (108.863 kg)     Height 02/12/16 0953 5\' 7"  (1.702 m)     Head Cir --      Peak Flow --      Pain Score 02/12/16 0939 5     Pain Loc --      Pain Edu? --      Excl. in GC? --     Constitutional: Alert and oriented. Well appearing and in no acute distress. Eyes: Conjunctivae are normal. PERRL. EOMI without pain.  Head: Atraumatic. ENT:      Ears: TMs visualized bilaterally without effusion, bulging, perforation.      Nose: No congestion/rhinnorhea.       Mouth/Throat: Mucous membranes are moist. Pharynx without erythema, swelling, exudate. Mild clear postnasal drip. Neck: No stridor. Supple with full range of motion. Hematological/Lymphatic/Immunilogical: No cervical lymphadenopathy. Cardiovascular: Normal rate, regular rhythm. Normal S1 and S2.  Good peripheral circulation. Respiratory: Normal respiratory effort without tachypnea or retractions. Lungs CTAB with breath sounds noted throughout. Neurologic:  Normal speech and language. No gross focal neurologic deficits are appreciated.  Skin:  Skin is warm, dry and intact. No rash noted. Psychiatric: Mood and affect are normal. Speech and behavior are normal. Patient exhibits appropriate insight and judgement.   ____________________________________________   LABS  None  ____________________________________________  EKG  None ____________________________________________  RADIOLOGY  None ____________________________________________    PROCEDURES  Procedure(s) performed: None    Medications - No data to display   ____________________________________________   INITIAL IMPRESSION / ASSESSMENT AND PLAN / ED COURSE  Patient's diagnosis is consistent with acute bronchitis following viral infection. Patient will be discharged home with prescriptions for azithromycin and albuterol inhaler to use as directed. [facility] if symptoms persist past this treatment course as well as to establish care. Patient is given ED precautions to return to the ED for any worsening or new symptoms.    ____________________________________________  FINAL CLINICAL IMPRESSION(S) / ED DIAGNOSES  Final diagnoses:  Acute bronchitis, unspecified organism      NEW MEDICATIONS STARTED DURING THIS VISIT:  New Prescriptions   ALBUTEROL (PROVENTIL HFA;VENTOLIN HFA) 108 (90 BASE) MCG/ACT INHALER    Inhale 1-2 puffs into the lungs every 6 (six) hours as needed  for wheezing or shortness of breath.   AZITHROMYCIN (ZITHROMAX Z-PAK) 250 MG TABLET    Take 2 tablets (500 mg) on  Day 1,  followed by 1 tablet (250 mg) once daily on Days 2 through 5.         Hope PigeonJami L Hagler, PA-C 02/12/16 1230  Minna AntisKevin Paduchowski, MD 02/12/16 1505

## 2016-02-12 NOTE — Discharge Instructions (Signed)

## 2016-02-12 NOTE — ED Notes (Signed)
Reports cough and sob.  No resp distress at this time.  States "i got bronchitis again"

## 2016-02-23 ENCOUNTER — Emergency Department
Admission: EM | Admit: 2016-02-23 | Discharge: 2016-02-23 | Disposition: A | Discharge status: Home / Self Care | Payer: BLUE CROSS/BLUE SHIELD | Attending: Emergency Medicine | Admitting: Emergency Medicine

## 2016-02-23 ENCOUNTER — Emergency Department: Payer: BLUE CROSS/BLUE SHIELD

## 2016-02-23 ENCOUNTER — Encounter: Payer: Self-pay | Admitting: Emergency Medicine

## 2016-02-23 ENCOUNTER — Other Ambulatory Visit: Payer: Self-pay

## 2016-02-23 DIAGNOSIS — J069 Acute upper respiratory infection, unspecified: Secondary | ICD-10-CM

## 2016-02-23 DIAGNOSIS — R0602 Shortness of breath: Secondary | ICD-10-CM | POA: Diagnosis present

## 2016-02-23 LAB — BASIC METABOLIC PANEL
Anion gap: 3 — ABNORMAL LOW (ref 5–15)
BUN: 12 mg/dL (ref 6–20)
CO2: 23 mmol/L (ref 22–32)
Calcium: 9 mg/dL (ref 8.9–10.3)
Chloride: 110 mmol/L (ref 101–111)
Creatinine, Ser: 0.58 mg/dL (ref 0.44–1.00)
GFR calc Af Amer: >60 mL/min (ref >60)
GFR calc non Af Amer: >60 mL/min (ref >60)
Glucose, Bld: 101 mg/dL — ABNORMAL HIGH (ref 65–99)
Potassium: 3.3 mmol/L — ABNORMAL LOW (ref 3.5–5.1)
Sodium: 136 mmol/L (ref 135–145)

## 2016-02-23 LAB — CBC
HCT: 35.9 % (ref 35.0–47.0)
Hemoglobin: 11.7 g/dL — ABNORMAL LOW (ref 12.0–16.0)
MCH: 26.3 pg (ref 26.0–34.0)
MCHC: 32.6 g/dL (ref 32.0–36.0)
MCV: 80.9 fL (ref 80.0–100.0)
Platelets: 227 10*3/uL (ref 150–440)
RBC: 4.44 MIL/uL (ref 3.80–5.20)
RDW: 13.9 % (ref 11.5–14.5)
WBC: 4.1 10*3/uL (ref 3.6–11.0)

## 2016-02-23 LAB — TROPONIN I: Troponin I: <0.03 ng/mL (ref <0.031)

## 2016-02-23 MED ORDER — GUAIFENESIN ER 600 MG PO TB12: 600.0000 mg | ORAL_TABLET | Freq: Two times a day (BID) | ORAL | Status: DC

## 2016-02-23 MED ORDER — PSEUDOEPHEDRINE HCL 60 MG PO TABS: 60.0000 mg | ORAL_TABLET | ORAL | Status: DC | PRN

## 2016-02-23 NOTE — ED Provider Notes (Signed)
[facility] Emergency Department Provider Note  ____________________________________________  Time seen: Approximately 12:44 PM  I have reviewed the triage vital signs and the nursing notes.   HISTORY  Chief Complaint Chest Pain; Shortness of Breath; and Nasal Congestion    HPI Jasmine Gordon is a 30 y.o. female who presents today with sinus congestion and shortness of breath. She states that she thinks that they may be allergy related but does not know because she "has never had allergies". She states she experiences shortness of breath when she goes from indoors to outdoors, for which she was prescribed an inhaler for on her last visit. She says she has an occasional cough with no production. She denies trying any medication to help with congestion. Denies any fever or chills, admits to fatigue.   Past Medical History  Diagnosis Date  . PTSD (post-traumatic stress disorder) 2016  . Anxiety   . Panic attack     There are no active problems to display for this patient.   Past Surgical History  Procedure Laterality Date  . Appendectomy      Current Outpatient Rx  Name  Route  Sig  Dispense  Refill  . albuterol (PROVENTIL HFA;VENTOLIN HFA) 108 (90 Base) MCG/ACT inhaler   Inhalation   Inhale 1-2 puffs into the lungs every 6 (six) hours as needed for wheezing or shortness of breath.   1 Inhaler   0   . clindamycin (CLEOCIN) 300 MG capsule   Oral   Take 1 capsule (300 mg total) by mouth 3 (three) times daily.   30 capsule   0   . clonazePAM (KLONOPIN) 0.5 MG tablet   Oral   Take 0.5 tablets (0.25 mg total) by mouth 2 (two) times daily as needed for anxiety.   3 tablet   0   . escitalopram (LEXAPRO) 10 MG tablet   Oral   Take 10 mg by mouth daily.          Marland Kitchen guaiFENesin (MUCINEX) 600 MG 12 hr tablet   Oral   Take 1 tablet (600 mg total) by mouth 2 (two) times daily.   60 tablet   2   . pseudoephedrine (SUDAFED) 60 MG tablet  Oral   Take 1 tablet (60 mg total) by mouth every 4 (four) hours as needed for congestion.   24 tablet   0     Allergies Asa and Reglan  No family history on file.  Social History Social History  Substance Use Topics  . Smoking status: Never Smoker   . Smokeless tobacco: Never Used  . Alcohol Use: Yes     Comment: occ    Review of Systems Constitutional: No fever/chills Eyes: No visual changes. ENT: No sore throat. Admits to sinus pain Cardiovascular: Denies chest pain. Respiratory: Admits to shortness of breath. Gastrointestinal: No abdominal pain.  No nausea, no vomiting.  No diarrhea.  No constipation. Genitourinary: Negative for dysuria. Musculoskeletal: Negative for back pain. Skin: Negative for rash. Neurological: Negative for headaches, focal weakness or numbness.  10-point ROS otherwise negative.  ____________________________________________   PHYSICAL EXAM:  VITAL SIGNS: ED Triage Vitals  Enc Vitals Group     BP 02/23/16 1030 137/75 mmHg     Pulse Rate 02/23/16 1030 117     Resp 02/23/16 1030 22     Temp 02/23/16 1030 98.7 F (37.1 C)     Temp Source 02/23/16 1030 Oral     SpO2 02/23/16 1030 99 %  Weight 02/23/16 1030 256 lb (116.121 kg)     Height 02/23/16 1030 5\' 7"  (1.702 m)     Head Cir --      Peak Flow --      Pain Score 02/23/16 1032 7     Pain Loc --      Pain Edu? --      Excl. in GC? --     Constitutional: Alert and oriented. Well appearing and in no acute distress. Eyes: Conjunctivae are normal. PERRL. EOMI. Head: Atraumatic. Nose: No congestion/rhinnorhea. Mouth/Throat: Mucous membranes are moist.  Oropharynx non-erythematous. Neck: No stridor.   Cardiovascular: Normal rate, regular rhythm. Grossly normal heart sounds.  Good peripheral circulation. Respiratory: Normal respiratory effort.  No retractions. Lungs CTAB. Gastrointestinal: Soft and nontender. No distention. No abdominal bruits. No CVA tenderness. Musculoskeletal:  No lower extremity tenderness nor edema.  No joint effusions. Neurologic:  Normal speech and language. No gross focal neurologic deficits are appreciated. No gait instability. Skin:  Skin is warm, dry and intact. No rash noted. Psychiatric: Mood and affect are normal. Speech and behavior are normal.  ____________________________________________   LABS (all labs ordered are listed, but only abnormal results are displayed)  Labs Reviewed  BASIC METABOLIC PANEL - Abnormal; Notable for the following:    Potassium 3.3 (*)    Glucose, Bld 101 (*)    Anion gap 3 (*)    All other components within normal limits  CBC - Abnormal; Notable for the following:    Hemoglobin 11.7 (*)    All other components within normal limits  TROPONIN I   ____________________________________________   PROCEDURES  Procedure(s) performed: None  Critical Care performed: No  ____________________________________________   INITIAL IMPRESSION / ASSESSMENT AND PLAN / ED COURSE  Pertinent labs & imaging results that were available during my care of the patient were reviewed by me and considered in my medical decision making (see chart for details).  Viral URI Rx given for her Sudafed and Mucinex. Reassurance provided to the patient that symptoms are viral. Follow up with PCP or return to the ER with any worsening symptomology. ____________________________________________   FINAL CLINICAL IMPRESSION(S) / ED DIAGNOSES  Final diagnoses:  URI, acute     This chart was dictated using voice recognition software/Dragon. Despite best efforts to proofread, errors can occur which can change the meaning. Any change was purely unintentional.   Evangeline Dakinharles M Jaythan Hinely, PA-C 02/23/16 1523  Emily FilbertJonathan E Williams, MD 02/23/16 [id number]

## 2016-02-23 NOTE — ED Notes (Signed)
Patient presents to the ED with cough, congestion, sore throat, chest pain and shortness of breath x 1 month.  Patient reports feeling out of breath very easily.  Patient reports her chest occasionally feels like a dull ache.

## 2016-02-23 NOTE — Discharge Instructions (Signed)
Upper Respiratory Infection, Adult °Most upper respiratory infections (URIs) are a viral infection of the air passages leading to the lungs. A URI affects the nose, throat, and upper air passages. The most common type of URI is nasopharyngitis and is typically referred to as "the common cold." °URIs run their course and usually go away on their own. Most of the time, a URI does not require medical attention, but sometimes a bacterial infection in the upper airways can follow a viral infection. This is called a secondary infection. Sinus and middle ear infections are common types of secondary upper respiratory infections. °Bacterial pneumonia can also complicate a URI. A URI can worsen asthma and chronic obstructive pulmonary disease (COPD). Sometimes, these complications can require emergency medical care and may be life threatening.  °CAUSES °Almost all URIs are caused by viruses. A virus is a type of germ and can spread from one person to another.  °RISKS FACTORS °You may be at risk for a URI if:  °· You smoke.   °· You have chronic heart or lung disease. °· You have a weakened defense (immune) system.   °· You are very young or very old.   °· You have nasal allergies or asthma. °· You work in crowded or poorly ventilated areas. °· You work in health care facilities or schools. °SIGNS AND SYMPTOMS  °Symptoms typically develop 2-3 days after you come in contact with a cold virus. Most viral URIs last 7-10 days. However, viral URIs from the influenza virus (flu virus) can last 14-18 days and are typically more severe. Symptoms may include:  °· Runny or stuffy (congested) nose.   °· Sneezing.   °· Cough.   °· Sore throat.   °· Headache.   °· Fatigue.   °· Fever.   °· Loss of appetite.   °· Pain in your forehead, behind your eyes, and over your cheekbones (sinus pain). °· Muscle aches.   °DIAGNOSIS  °Your health care provider may diagnose a URI by: °· Physical exam. °· Tests to check that your symptoms are not due to  another condition such as: °· Strep throat. °· Sinusitis. °· Pneumonia. °· Asthma. °TREATMENT  °A URI goes away on its own with time. It cannot be cured with medicines, but medicines may be prescribed or recommended to relieve symptoms. Medicines may help: °· Reduce your fever. °· Reduce your cough. °· Relieve nasal congestion. °HOME CARE INSTRUCTIONS  °· Take medicines only as directed by your health care provider.   °· Gargle warm saltwater or take cough drops to comfort your throat as directed by your health care provider. °· Use a warm mist humidifier or inhale steam from a shower to increase air moisture. This may make it easier to breathe. °· Drink enough fluid to keep your urine clear or pale yellow.   °· Eat soups and other clear broths and maintain good nutrition.   °· Rest as needed.   °· Return to work when your temperature has returned to normal or as your health care provider advises. You may need to stay home longer to avoid infecting others. You can also use a face mask and careful hand washing to prevent spread of the virus. °· Increase the usage of your inhaler if you have asthma.   °· Do not use any tobacco products, including cigarettes, chewing tobacco, or electronic cigarettes. If you need help quitting, ask your health care provider. °PREVENTION  °The best way to protect yourself from getting a cold is to practice good hygiene.  °· Avoid oral or hand contact with people with cold   symptoms.   Wash your hands often if contact occurs.  There is no clear evidence that vitamin C, vitamin E, echinacea, or exercise reduces the chance of developing a cold. However, it is always recommended to get plenty of rest, exercise, and practice good nutrition.  SEEK MEDICAL CARE IF:   You are getting worse rather than better.   Your symptoms are not controlled by medicine.   You have chills.  You have worsening shortness of breath.  You have brown or red mucus.  You have yellow or brown nasal  discharge.  You have pain in your face, especially when you bend forward.  You have a fever.  You have swollen neck glands.  You have pain while swallowing.  You have white areas in the back of your throat. SEEK IMMEDIATE MEDICAL CARE IF:   You have severe or persistent:  Headache.  Ear pain.  Sinus pain.  Chest pain.  You have chronic lung disease and any of the following:  Wheezing.  Prolonged cough.  Coughing up blood.  A change in your usual mucus.  You have a stiff neck.  You have changes in your:  Vision.  Hearing.  Thinking.  Mood. MAKE SURE YOU:   Understand these instructions.  Will watch your condition.  Will get help right away if you are not doing well or get worse.   This information is not intended to replace advice given to you by your health care provider. Make sure you discuss any questions you have with your health care provider.   Document Released: 05/03/2001 Document Revised: 03/24/2015 Document Reviewed: 02/12/2014 Elsevier Interactive Patient Education 2016 [employer]  Viral Infections A viral infection can be caused by different types of viruses.Most viral infections are not serious and resolve on their own. However, some infections may cause severe symptoms and may lead to further complications. SYMPTOMS Viruses can frequently cause:  Minor sore throat.  Aches and pains.  Headaches.  Runny nose.  Different types of rashes.  Watery eyes.  Tiredness.  Cough.  Loss of appetite.  Gastrointestinal infections, resulting in nausea, vomiting, and diarrhea. These symptoms do not respond to antibiotics because the infection is not caused by bacteria. However, you might catch a bacterial infection following the viral infection. This is sometimes called a "superinfection." Symptoms of such a bacterial infection may include:  Worsening sore throat with pus and difficulty swallowing.  Swollen neck glands.  Chills  and a high or persistent fever.  Severe headache.  Tenderness over the sinuses.  Persistent overall ill feeling (malaise), muscle aches, and tiredness (fatigue).  Persistent cough.  Yellow, green, or brown mucus production with coughing. HOME CARE INSTRUCTIONS   Only take over-the-counter or prescription medicines for pain, discomfort, diarrhea, or fever as directed by your caregiver.  Drink enough water and fluids to keep your urine clear or pale yellow. Sports drinks can provide valuable electrolytes, sugars, and hydration.  Get plenty of rest and maintain proper nutrition. Soups and broths with crackers or rice are fine. SEEK IMMEDIATE MEDICAL CARE IF:   You have severe headaches, shortness of breath, chest pain, neck pain, or an unusual rash.  You have uncontrolled vomiting, diarrhea, or you are unable to keep down fluids.  You or your child has an oral temperature above 102 F (38.9 C), not controlled by medicine.  Your baby is older than 3 months with a rectal temperature of 102 F (38.9 C) or higher.  Your baby is 443 months old  or younger with a rectal temperature of 100.4 F (38 C) or higher. MAKE SURE YOU:   Understand these instructions.  Will watch your condition.  Will get help right away if you are not doing well or get worse.   This information is not intended to replace advice given to you by your health care provider. Make sure you discuss any questions you have with your health care provider.   Document Released: 08/17/2005 Document Revised: 01/30/2012 Document Reviewed: 04/15/2015 Elsevier Interactive Patient Education [employer]

## 2016-03-29 ENCOUNTER — Emergency Department
Admission: EM | Admit: 2016-03-29 | Discharge: 2016-03-29 | Disposition: A | Discharge status: Home / Self Care | Payer: BLUE CROSS/BLUE SHIELD | Attending: Emergency Medicine | Admitting: Emergency Medicine

## 2016-03-29 DIAGNOSIS — E860 Dehydration: Secondary | ICD-10-CM

## 2016-03-29 DIAGNOSIS — N920 Excessive and frequent menstruation with regular cycle: Secondary | ICD-10-CM | POA: Insufficient documentation

## 2016-03-29 DIAGNOSIS — N390 Urinary tract infection, site not specified: Secondary | ICD-10-CM | POA: Diagnosis not present

## 2016-03-29 DIAGNOSIS — R42 Dizziness and giddiness: Secondary | ICD-10-CM | POA: Diagnosis present

## 2016-03-29 LAB — CBC
HCT: 36.3 % (ref 35.0–47.0)
Hemoglobin: 11.6 g/dL — ABNORMAL LOW (ref 12.0–16.0)
MCH: 26.6 pg (ref 26.0–34.0)
MCHC: 32.1 g/dL (ref 32.0–36.0)
MCV: 82.7 fL (ref 80.0–100.0)
Platelets: 185 10*3/uL (ref 150–440)
RBC: 4.38 MIL/uL (ref 3.80–5.20)
RDW: 13.9 % (ref 11.5–14.5)
WBC: 4 10*3/uL (ref 3.6–11.0)

## 2016-03-29 LAB — BASIC METABOLIC PANEL
Anion gap: 6 (ref 5–15)
BUN: 10 mg/dL (ref 6–20)
CO2: 26 mmol/L (ref 22–32)
Calcium: 9.2 mg/dL (ref 8.9–10.3)
Chloride: 108 mmol/L (ref 101–111)
Creatinine, Ser: 0.63 mg/dL (ref 0.44–1.00)
GFR calc Af Amer: >60 mL/min (ref >60)
GFR calc non Af Amer: >60 mL/min (ref >60)
Glucose, Bld: 109 mg/dL — ABNORMAL HIGH (ref 65–99)
Potassium: 4 mmol/L (ref 3.5–5.1)
Sodium: 140 mmol/L (ref 135–145)

## 2016-03-29 LAB — URINALYSIS COMPLETE WITH MICROSCOPIC (ARMC ONLY)
Bilirubin Urine: NEGATIVE
Glucose, UA: NEGATIVE mg/dL
Ketones, ur: NEGATIVE mg/dL
Nitrite: NEGATIVE
Protein, ur: NEGATIVE mg/dL
Specific Gravity, Urine: 1.015 (ref 1.005–1.030)
pH: 5 (ref 5.0–8.0)

## 2016-03-29 LAB — POCT PREGNANCY, URINE: Preg Test, Ur: NEGATIVE

## 2016-03-29 LAB — PREGNANCY, URINE: Preg Test, Ur: NEGATIVE

## 2016-03-29 MED ORDER — SULFAMETHOXAZOLE-TRIMETHOPRIM 800-160 MG PO TABS: 1.0000 | ORAL_TABLET | Freq: Two times a day (BID) | ORAL | Status: DC

## 2016-03-29 MED ORDER — SULFAMETHOXAZOLE-TRIMETHOPRIM 800-160 MG PO TABS: 1.0000 | ORAL_TABLET | Freq: Once | ORAL | Status: DC

## 2016-03-29 MED ORDER — SODIUM CHLORIDE 0.9 % IV BOLUS (SEPSIS)
1000.0000 mL | Freq: Once | INTRAVENOUS | Status: AC
  Administered 2016-03-29: 1000 mL via INTRAVENOUS

## 2016-03-29 NOTE — ED Provider Notes (Signed)
[facility] Emergency Department Provider Note   ____________________________________________  Time seen: Approximately 8:18 AM  I have reviewed the triage vital signs and the nursing notes.   HISTORY  Chief Complaint Dizziness    HPI Abbygael Lyndsee Casa is a 30 y.o. female who is complaining that she's had extreme heavy vaginal bleeding over the past couple weeks. Patient states that the bleeding has slowed down, but this morning she felt dizzy and weak. Patient states that she was concerned that she may have lost too much blood. Patient states that the bleeding has slowed down and she has no abdominal pain. Patient currently has an IUD but states that she generally does not have heavy periods. She denies any dysuria frequency or hematuria as well. She denies any nausea vomiting or change in her bowels. Patient also denies any fever, chills, cough or cold symptoms.   Past Medical History  Diagnosis Date  . PTSD (post-traumatic stress disorder) 2016  . Anxiety   . Panic attack     There are no active problems to display for this patient.   Past Surgical History  Procedure Laterality Date  . Appendectomy      Current Outpatient Rx  Name  Route  Sig  Dispense  Refill  . albuterol (PROVENTIL HFA;VENTOLIN HFA) 108 (90 Base) MCG/ACT inhaler   Inhalation   Inhale 1-2 puffs into the lungs every 6 (six) hours as needed for wheezing or shortness of breath.   1 Inhaler   0   . clindamycin (CLEOCIN) 300 MG capsule   Oral   Take 1 capsule (300 mg total) by mouth 3 (three) times daily.   30 capsule   0   . clonazePAM (KLONOPIN) 0.5 MG tablet   Oral   Take 0.5 tablets (0.25 mg total) by mouth 2 (two) times daily as needed for anxiety.   3 tablet   0   . escitalopram (LEXAPRO) 10 MG tablet   Oral   Take 10 mg by mouth daily.          Marland Kitchen guaiFENesin (MUCINEX) 600 MG 12 hr tablet   Oral   Take 1 tablet (600 mg total) by mouth 2 (two) times  daily.   60 tablet   2   . pseudoephedrine (SUDAFED) 60 MG tablet   Oral   Take 1 tablet (60 mg total) by mouth every 4 (four) hours as needed for congestion.   24 tablet   0   . sulfamethoxazole-trimethoprim (BACTRIM DS,SEPTRA DS) 800-160 MG tablet   Oral   Take 1 tablet by mouth 2 (two) times daily.   14 tablet   0     Allergies Asa and Reglan  No family history on file.  Social History Social History  Substance Use Topics  . Smoking status: Never Smoker   . Smokeless tobacco: Never Used  . Alcohol Use: No     Comment: occ    Review of Systems Constitutional: No fever/chills Eyes: No visual changes. ENT: No sore throat. Cardiovascular: Denies chest pain. Respiratory: Denies shortness of breath. Gastrointestinal: No abdominal pain.  No nausea, no vomiting.  No diarrhea.  No constipation. Genitourinary: Negative for dysuria. Patient has been having heavy period recently. Musculoskeletal: Negative for back pain. Skin: Negative for rash. Neurological: Negative for headaches, positive for weakness and dizziness but no numbness.  10-point ROS otherwise negative.  ____________________________________________   PHYSICAL EXAM:  VITAL SIGNS: ED Triage Vitals  Enc Vitals Group  BP 03/29/16 0754 141/82 mmHg     Pulse Rate 03/29/16 0754 114     Resp 03/29/16 0754 18     Temp 03/29/16 0757 98.5 F (36.9 C)     Temp Source 03/29/16 0754 Oral     SpO2 03/29/16 0754 98 %     Weight 03/29/16 0754 258 lb (117.028 kg)     Height 03/29/16 0754 5\' 7"  (1.702 m)     Head Cir --      Peak Flow --      Pain Score --      Pain Loc --      Pain Edu? --      Excl. in GC? --     Constitutional: Alert and oriented. Well appearing and in no acute distress. Eyes: Conjunctivae are normal. PERRL. EOMI. Head: Atraumatic. Nose: No congestion/rhinnorhea. Mouth/Throat: Mucous membranes are moist.  Oropharynx non-erythematous. Neck: No stridor.   Cardiovascular: Patient is  tachycardic, regular rhythm. Grossly normal heart sounds.  Good peripheral circulation. Respiratory: Normal respiratory effort.  No retractions. Lungs CTAB. Gastrointestinal: Soft and nontender. No distention. No abdominal bruits. No CVA tenderness. Musculoskeletal: No lower extremity tenderness nor edema.  No joint effusions. Neurologic:  Normal speech and language. No gross focal neurologic deficits are appreciated. No gait instability. Skin:  Skin is warm, dry and intact. No rash noted. Psychiatric: Mood and affect are normal. Speech and behavior are normal.  ____________________________________________   LABS (all labs ordered are listed, but only abnormal results are displayed)  Labs Reviewed  BASIC METABOLIC PANEL - Abnormal; Notable for the following:    Glucose, Bld 109 (*)    All other components within normal limits  CBC - Abnormal; Notable for the following:    Hemoglobin 11.6 (*)    All other components within normal limits  URINALYSIS COMPLETEWITH MICROSCOPIC ([facility] ONLY) - Abnormal; Notable for the following:    Color, Urine YELLOW (*)    APPearance HAZY (*)    Hgb urine dipstick 3+ (*)    Leukocytes, UA 1+ (*)    Bacteria, UA RARE (*)    Squamous Epithelial / LPF 6-30 (*)    All other components within normal limits  PREGNANCY, URINE  POCT PREGNANCY, URINE   ____________________________________________  EKG  none ____________________________________________  RADIOLOGY  None ____________________________________________   PROCEDURES  Procedure(s) performed: None  Critical Care performed: No  ____________________________________________   INITIAL IMPRESSION / ASSESSMENT AND PLAN / ED COURSE  Pertinent labs & imaging results that were available during my care of the patient were reviewed by me and considered in my medical decision making (see chart for details).  11:01 AM Patient will be given IV fluid bolus after we do orthostatic vital signs.  Patient will also have routine labs and urinalysis.    11:01 AM Patient was given a liter IV fluids for her dizziness and she will be started on Septra DS for 7 days for her urinary tract infection. Patient was told to increase fluids and rest at home, and return immediately if condition worsens. Patient was told to follow-up with her OB/GYN concerning her heavy menstrual periods ____________________________________________   FINAL CLINICAL IMPRESSION(S) / ED DIAGNOSES  Final diagnoses:  Dizziness  Dehydration  UTI (lower urinary tract infection)  Menorrhagia with regular cycle      NEW MEDICATIONS STARTED DURING THIS VISIT:  New Prescriptions   SULFAMETHOXAZOLE-TRIMETHOPRIM (BACTRIM DS,SEPTRA DS) 800-160 MG TABLET    Take 1 tablet by mouth 2 (two) times daily.  Note:  This document was prepared using Dragon voice recognition software and may include unintentional dictation errors.    Leona Carry, MD 03/29/16 1101

## 2016-03-29 NOTE — ED Notes (Signed)
Pt alert and oriented X4, active, cooperative, pt in NAD. RR even and unlabored, color WNL.  Pt informed to return if any life threatening symptoms occur.   

## 2016-03-29 NOTE — ED Notes (Signed)
MD at bedside. 

## 2016-03-29 NOTE — ED Notes (Signed)
Pt arrives to ER via POV c/o nauseated, dizzy X 2 night. Pt finishing menstrual cycle today, reports that it was very heavy this week; heavier than normal. Pt alert and oriented X4, active, cooperative, pt in NAD. RR even and unlabored, color WNL.

## 2016-04-02 ENCOUNTER — Encounter: Payer: Self-pay | Admitting: Emergency Medicine

## 2016-04-02 ENCOUNTER — Emergency Department
Admission: EM | Admit: 2016-04-02 | Discharge: 2016-04-02 | Disposition: A | Discharge status: Home / Self Care | Payer: BLUE CROSS/BLUE SHIELD | Attending: Emergency Medicine | Admitting: Emergency Medicine

## 2016-04-02 DIAGNOSIS — N898 Other specified noninflammatory disorders of vagina: Secondary | ICD-10-CM | POA: Insufficient documentation

## 2016-04-02 DIAGNOSIS — R42 Dizziness and giddiness: Secondary | ICD-10-CM | POA: Diagnosis not present

## 2016-04-02 DIAGNOSIS — E860 Dehydration: Secondary | ICD-10-CM | POA: Insufficient documentation

## 2016-04-02 DIAGNOSIS — Z79899 Other long term (current) drug therapy: Secondary | ICD-10-CM | POA: Diagnosis not present

## 2016-04-02 LAB — BASIC METABOLIC PANEL
Anion gap: 9 (ref 5–15)
BUN: 11 mg/dL (ref 6–20)
CO2: 21 mmol/L — ABNORMAL LOW (ref 22–32)
Calcium: 9.4 mg/dL (ref 8.9–10.3)
Chloride: 104 mmol/L (ref 101–111)
Creatinine, Ser: 0.94 mg/dL (ref 0.44–1.00)
GFR calc Af Amer: >60 mL/min (ref >60)
GFR calc non Af Amer: >60 mL/min (ref >60)
Glucose, Bld: 105 mg/dL — ABNORMAL HIGH (ref 65–99)
Potassium: 3.6 mmol/L (ref 3.5–5.1)
Sodium: 134 mmol/L — ABNORMAL LOW (ref 135–145)

## 2016-04-02 LAB — URINALYSIS COMPLETE WITH MICROSCOPIC (ARMC ONLY)
Bilirubin Urine: NEGATIVE
Glucose, UA: NEGATIVE mg/dL
Ketones, ur: NEGATIVE mg/dL
Nitrite: NEGATIVE
Protein, ur: NEGATIVE mg/dL
Specific Gravity, Urine: 1.021 (ref 1.005–1.030)
pH: 5 (ref 5.0–8.0)

## 2016-04-02 LAB — CBC
HCT: 39.2 % (ref 35.0–47.0)
Hemoglobin: 12.7 g/dL (ref 12.0–16.0)
MCH: 26.2 pg (ref 26.0–34.0)
MCHC: 32.3 g/dL (ref 32.0–36.0)
MCV: 81.1 fL (ref 80.0–100.0)
Platelets: 222 10*3/uL (ref 150–440)
RBC: 4.84 MIL/uL (ref 3.80–5.20)
RDW: 14 % (ref 11.5–14.5)
WBC: 4.3 10*3/uL (ref 3.6–11.0)

## 2016-04-02 LAB — POCT PREGNANCY, URINE: Preg Test, Ur: NEGATIVE

## 2016-04-02 LAB — TROPONIN I: Troponin I: <0.03 ng/mL (ref <0.031)

## 2016-04-02 MED ORDER — FLUCONAZOLE 150 MG PO TABS: ORAL_TABLET | ORAL | Status: DC

## 2016-04-02 NOTE — ED Notes (Signed)
Discussed discharge instructions and follow-up care with patient. No questions or concerns at this time. Pt stable at discharge.  

## 2016-04-02 NOTE — Discharge Instructions (Signed)
Dehydration, Adult °Dehydration is a condition in which you do not have enough fluid or water in your body. It happens when you take in less fluid than you lose. Vital organs such as the kidneys, brain, and heart cannot function without a proper amount of fluids. Any loss of fluids from the body can cause dehydration.  °Dehydration can range from mild to severe. This condition should be treated right away to help prevent it from becoming severe. °CAUSES  °This condition may be caused by: °· Vomiting. °· Diarrhea. °· Excessive sweating, such as when exercising in hot or humid weather. °· Not drinking enough fluid during strenuous exercise or during an illness. °· Excessive urine output. °· Fever. °· Certain medicines. °RISK FACTORS °This condition is more likely to develop in: °· People who are taking certain medicines that cause the body to lose excess fluid (diuretics).   °· People who have a chronic illness, such as diabetes, that may increase urination. °· Older adults.   °· People who live at high altitudes.   °· People who participate in endurance sports.   °SYMPTOMS  °Mild Dehydration °· Thirst. °· Dry lips. °· Slightly dry mouth. °· Dry, warm skin. °Moderate Dehydration °· Very dry mouth.   °· Muscle cramps.   °· Dark urine and decreased urine production.   °· Decreased tear production.   °· Headache.   °· Light-headedness, especially when you stand up from a sitting position.   °Severe Dehydration °· Changes in skin.   °· Cold and clammy skin.   °· Skin does not spring back quickly when lightly pinched and released.   °· Changes in body fluids.   °· Extreme thirst.   °· No tears.   °· Not able to sweat when body temperature is high, such as in hot weather.   °· Minimal urine production.   °· Changes in vital signs.   °· Rapid, weak pulse (more than 100 beats per minute when you are sitting still).   °· Rapid breathing.   °· Low blood pressure.   °· Other changes.   °· Sunken eyes.   °· Cold hands and feet.    °· Confusion. °· Lethargy and difficulty being awakened. °· Fainting (syncope).   °· Short-term weight loss.   °· Unconsciousness. °DIAGNOSIS  °This condition may be diagnosed based on your symptoms. You may also have tests to determine how severe your dehydration is. These tests may include:  °· Urine tests.   °· Blood tests.   °TREATMENT  °Treatment for this condition depends on the severity. Mild or moderate dehydration can often be treated at home. Treatment should be started right away. Do not wait until dehydration becomes severe. Severe dehydration needs to be treated at the hospital. °Treatment for Mild Dehydration °· Drinking plenty of water to replace the fluid you have lost.   °· Replacing minerals in your blood (electrolytes) that you may have lost.   °Treatment for Moderate Dehydration  °· Consuming oral rehydration solution (ORS). °Treatment for Severe Dehydration °· Receiving fluid through an IV tube.   °· Receiving electrolyte solution through a feeding tube that is passed through your nose and into your stomach (nasogastric tube or NG tube). °· Correcting any abnormalities in electrolytes. °HOME CARE INSTRUCTIONS  °· Drink enough fluid to keep your urine clear or pale yellow.   °· Drink water or fluid slowly by taking small sips. You can also try sucking on ice cubes.  °· Have food or beverages that contain electrolytes. Examples include bananas and sports drinks. °· Take over-the-counter and prescription medicines only as told by your health care provider.   °· Prepare ORS according to the manufacturer's instructions. Take sips   of ORS every 5 minutes until your urine returns to normal. °· If you have vomiting or diarrhea, continue to try to drink water, ORS, or both.   °· If you have diarrhea, avoid:   °¨ Beverages that contain caffeine.   °¨ Fruit juice.   °¨ Milk.    °¨ Carbonated soft drinks. °· Do not take salt tablets. This can lead to the condition of having too much sodium in your body  (hypernatremia).   °SEEK MEDICAL CARE IF: °· You cannot eat or drink without vomiting. °· You have had moderate diarrhea during a period of more than 24 hours. °· You have a fever. °SEEK IMMEDIATE MEDICAL CARE IF:  °· You have extreme thirst. °· You have severe diarrhea. °· You have not urinated in 6-8 hours, or you have urinated only a small amount of very dark urine. °· You have shriveled skin. °· You are dizzy, confused, or both. °  °This information is not intended to replace advice given to you by your health care provider. Make sure you discuss any questions you have with your health care provider. °  °Document Released: 11/07/2005 Document Revised: 07/29/2015 Document Reviewed: 03/25/2015 °Elsevier Interactive Patient Education ©2016 [employer] ° °Dizziness °Dizziness is a common problem. It is a feeling of unsteadiness or light-headedness. You may feel like you are about to faint. Dizziness can lead to injury if you stumble or fall. Anyone can become dizzy, but dizziness is more common in older adults. This condition can be caused by a number of things, including medicines, dehydration, or illness. °HOME CARE INSTRUCTIONS °Taking these steps may help with your condition: °Eating and Drinking °· Drink enough fluid to keep your urine clear or pale yellow. This helps to keep you from becoming dehydrated. Try to drink more clear fluids, such as water. °· Do not drink alcohol. °· Limit your caffeine intake if directed by your health care provider. °· Limit your salt intake if directed by your health care provider. °Activity °· Avoid making quick movements. °¨ Rise slowly from chairs and steady yourself until you feel okay. °¨ In the morning, first sit up on the side of the bed. When you feel okay, stand slowly while you hold onto something until you know that your balance is fine. °· Move your legs often if you need to stand in one place for a long time. Tighten and relax your muscles in your legs while you  are standing. °· Do not drive or operate heavy machinery if you feel dizzy. °· Avoid bending down if you feel dizzy. Place items in your home so that they are easy for you to reach without leaning over. °Lifestyle °· Do not use any tobacco products, including cigarettes, chewing tobacco, or electronic cigarettes. If you need help quitting, ask your health care provider. °· Try to reduce your stress level, such as with yoga or meditation. Talk with your health care provider if you need help. °General Instructions °· Watch your dizziness for any changes. °· Take medicines only as directed by your health care provider. Talk with your health care provider if you think that your dizziness is caused by a medicine that you are taking. °· Tell a friend or a family member that you are feeling dizzy. If he or she notices any changes in your behavior, have this person call your health care provider. °· Keep all follow-up visits as directed by your health care provider. This is important. °SEEK MEDICAL CARE IF: °· Your dizziness does not go away. °·   Your dizziness or light-headedness gets worse. °· You feel nauseous. °· You have reduced hearing. °· You have new symptoms. °· You are unsteady on your feet or you feel like the room is spinning. °SEEK IMMEDIATE MEDICAL CARE IF: °· You vomit or have diarrhea and are unable to eat or drink anything. °· You have problems talking, walking, swallowing, or using your arms, hands, or legs. °· You feel generally weak. °· You are not thinking clearly or you have trouble forming sentences. It may take a friend or family member to notice this. °· You have chest pain, abdominal pain, shortness of breath, or sweating. °· Your vision changes. °· You notice any bleeding. °· You have a headache. °· You have neck pain or a stiff neck. °· You have a fever. °  °This information is not intended to replace advice given to you by your health care provider. Make sure you discuss any questions you have  with your health care provider. °  °Document Released: 05/03/2001 Document Revised: 03/24/2015 Document Reviewed: 11/03/2014 °Elsevier Interactive Patient Education ©2016 [employer] ° °

## 2016-04-02 NOTE — ED Notes (Signed)
Patient states she was seen here recently (03/29/16) and diagnosed with UTI. Patient states she has been dizzy last couple of days, worse this am. States she was [facility] Patient was then placed on Bactrim by ER (was to take 5th dose today). States she also has vaginal itching after antibiotic.

## 2016-04-02 NOTE — ED Provider Notes (Signed)
CSN: [id number]     Arrival date & time 04/02/16  1141 History   First MD Initiated Contact with Patient 04/02/16 1333     Chief Complaint  Patient presents with  . Dizziness     HPI Comments: 30 year old female presents today complaining of dizziness and lightheadedness since she woke up this morning. Pt states that she has been on two different courses of antibiotics consecutively for UTI and then lymphadenitis. Thought she might pass out this morning so she came to the ER to be evaluated. She did not have a syncopal episode. Has not had any chest pain, nausea, vomiting or diaphoresis. Admits she has felt more anxious than normal and has not been taking her zoloft this week because she was concerned how it would interact with the antibiotics.   The history is provided by the patient.    Past Medical History  Diagnosis Date  . PTSD (post-traumatic stress disorder) 2016  . Anxiety   . Panic attack    Past Surgical History  Procedure Laterality Date  . Appendectomy     No family history on file. Social History  Substance Use Topics  . Smoking status: Never Smoker   . Smokeless tobacco: Never Used  . Alcohol Use: No     Comment: occ   OB History    No data available     Review of Systems  Constitutional: Negative for fever and chills.  Cardiovascular: Negative for chest pain and palpitations.  Neurological: Positive for dizziness, weakness and light-headedness.  All other systems reviewed and are negative.     Allergies  Asa and Reglan  Home Medications   Prior to Admission medications   Medication Sig Start Date End Date Taking? Authorizing Provider  albuterol (PROVENTIL HFA;VENTOLIN HFA) 108 (90 Base) MCG/ACT inhaler Inhale 1-2 puffs into the lungs every 6 (six) hours as needed for wheezing or shortness of breath. 02/12/16   Jami L Hagler, PA-C  clindamycin (CLEOCIN) 300 MG capsule Take 1 capsule (300 mg total) by mouth 3 (three) times daily. 01/18/16   Jenise V  Bacon Menshew, PA-C  clonazePAM (KLONOPIN) 0.5 MG tablet Take 0.5 tablets (0.25 mg total) by mouth 2 (two) times daily as needed for anxiety. 01/29/16   Gayla DossEryka A Gayle, MD  escitalopram (LEXAPRO) 10 MG tablet Take 10 mg by mouth daily.     Historical Provider, MD  fluconazole (DIFLUCAN) 150 MG tablet Take 1 tab PO once, repeat in 3 days if no better 04/02/16   Christella ScheuermannEmma Ernisha Sorn V, PA-C  guaiFENesin (MUCINEX) 600 MG 12 hr tablet Take 1 tablet (600 mg total) by mouth 2 (two) times daily. 02/23/16 02/22/17  Charmayne Sheerharles M Beers, PA-C  pseudoephedrine (SUDAFED) 60 MG tablet Take 1 tablet (60 mg total) by mouth every 4 (four) hours as needed for congestion. 02/23/16   Charmayne Sheerharles M Beers, PA-C  sulfamethoxazole-trimethoprim (BACTRIM DS,SEPTRA DS) 800-160 MG tablet Take 1 tablet by mouth 2 (two) times daily. 03/29/16   Leona CarryLinda M Taylor, MD   BP 126/91 mmHg  Pulse 100  Temp(Src) 98.8 F (37.1 C) (Oral)  Resp 16  Ht 5\' 7"  (1.702 m)  Wt 113.399 kg  BMI 39.15 kg/m2  SpO2 100%  LMP 03/29/2016 Physical Exam  Constitutional: She is oriented to person, place, and time. Vital signs are normal. She appears well-developed and well-nourished. She is active.  Non-toxic appearance. She does not have a sickly appearance. She does not appear ill.  HENT:  Head: Normocephalic and atraumatic.  Right Ear: External ear normal.  Left Ear: External ear normal.  Nose: Nose normal.  Mouth/Throat: Uvula is midline, oropharynx is clear and moist and mucous membranes are normal.  Eyes: Conjunctivae and EOM are normal. Pupils are equal, round, and reactive to light.  Neck: Normal range of motion. Neck supple.  Cardiovascular: Normal rate, regular rhythm, normal heart sounds and intact distal pulses.  Exam reveals no gallop and no friction rub.   No murmur heard. Pulmonary/Chest: Effort normal and breath sounds normal.  Musculoskeletal: Normal range of motion.  Neurological: She is alert and oriented to person, place, and time. No cranial nerve  deficit. Coordination normal.  Skin: Skin is warm and dry.  Psychiatric: She has a normal mood and affect. Her behavior is normal. Judgment and thought content normal.  Nursing note and vitals reviewed.   ED Course  Procedures (including critical care time) Labs Review Labs Reviewed  BASIC METABOLIC PANEL - Abnormal; Notable for the following:    Sodium 134 (*)    CO2 21 (*)    Glucose, Bld 105 (*)    All other components within normal limits  URINALYSIS COMPLETEWITH MICROSCOPIC ([facility] ONLY) - Abnormal; Notable for the following:    Color, Urine YELLOW (*)    APPearance HAZY (*)    Hgb urine dipstick 1+ (*)    Leukocytes, UA TRACE (*)    Bacteria, UA RARE (*)    Squamous Epithelial / LPF 0-5 (*)    All other components within normal limits  CBC  TROPONIN I  POC URINE PREG, ED  POCT PREGNANCY, URINE    Imaging Review No results found. I have personally reviewed and evaluated these images and lab results as part of my medical decision-making.   EKG Interpretation None     EKG: Sinus tachycardia, non specific t wave changes which are present in previous EKGs. MDM  Normal labs, slightly elevated heart rate, suspect pt is slightly dehydrated in addition to taking the antibiotics. EKG is normal and unchanged from previous readings.   Push fluids at home and stop taking antibiotics  Pt also reports vaginal irritation - she denies vaginal discharge and REFUSES pelvic exam. She is requesting diflucan to take for possible yeast infection.  Final diagnoses:  Mild dehydration  Vaginal itching  Dizziness        Christella Scheuermann, PA-C 04/02/16 1612  Jeanmarie Plant, MD 04/03/16 1539

## 2016-04-02 NOTE — ED Notes (Signed)
Reports dizziness and vaginal itching.  Reports taking 2 antibiotics for UTI.  Skin w/d, NAD

## 2016-05-09 ENCOUNTER — Ambulatory Visit: Payer: Self-pay | Admitting: Family Medicine

## 2016-05-14 ENCOUNTER — Emergency Department
Admission: EM | Admit: 2016-05-14 | Discharge: 2016-05-14 | Disposition: A | Discharge status: Home / Self Care | Payer: BLUE CROSS/BLUE SHIELD | Attending: Emergency Medicine | Admitting: Emergency Medicine

## 2016-05-14 ENCOUNTER — Encounter: Payer: Self-pay | Admitting: Emergency Medicine

## 2016-05-14 DIAGNOSIS — L710 Perioral dermatitis: Secondary | ICD-10-CM | POA: Diagnosis not present

## 2016-05-14 DIAGNOSIS — T781XXA Other adverse food reactions, not elsewhere classified, initial encounter: Secondary | ICD-10-CM

## 2016-05-14 MED ORDER — HYDROXYZINE PAMOATE 25 MG PO CAPS: 25.0000 mg | ORAL_CAPSULE | Freq: Three times a day (TID) | ORAL | Status: DC | PRN

## 2016-05-14 MED ORDER — PREDNISONE 10 MG PO TABS: ORAL_TABLET | ORAL | Status: DC

## 2016-05-14 MED ORDER — ALBUTEROL SULFATE HFA 108 (90 BASE) MCG/ACT IN AERS: 2.0000 | INHALATION_SPRAY | Freq: Four times a day (QID) | RESPIRATORY_TRACT | Status: DC | PRN

## 2016-05-14 MED ORDER — RANITIDINE HCL 150 MG PO TABS: 150.0000 mg | ORAL_TABLET | Freq: Two times a day (BID) | ORAL | Status: DC

## 2016-05-14 MED ORDER — DEXAMETHASONE SODIUM PHOSPHATE 4 MG/ML IJ SOLN
4.0000 mg | Freq: Once | INTRAMUSCULAR | Status: AC
  Administered 2016-05-14: 4 mg via INTRAMUSCULAR

## 2016-05-14 MED ORDER — DEXAMETHASONE SODIUM PHOSPHATE 4 MG/ML IJ SOLN
4.0000 mg | Freq: Once | INTRAMUSCULAR | Status: DC
  Filled 2016-05-14: qty 1

## 2016-05-14 NOTE — ED Provider Notes (Signed)
[facility] Emergency Department Provider Note   ____________________________________________  Time seen: Approximately 11:54 AM  I have reviewed the triage vital signs and the nursing notes.   HISTORY  Chief Complaint Facial Swelling    HPI Jasmine Gordon is a 30 y.o. female who arrives to the emergency department with complaint of "I feel like I have a head cold", perioral bumps and itching that began this morning. When the patient awoke this morning she noticed an itchy papular perioral rash with increased fatigue and sneezing. She also reports and "upset stomach" that she has had one loose stool without hematochezia or melena, and feels that her eyes may be swollen. States the only thing that she did differently is cooking with Tumeric last night; denies use of new lotions, body wash, laundry detergents etc. Patient does recall that last night she felt very drowsy and she slept unusually well and awoke feeling very tired and sluggish. Today she feels short of breath but says she was told in triage that her oxygen sats were normal.  Admits to itching on her arms and legs but does has not seen any new rashes. She has not attempted to manage her symptoms with pharmacologic or non-pharmacologic treatment. Denies vision changes, eye pain, sore throat, itching throat, watery eyes, dysphagia, dyspnea and chest pain.    Past Medical History  Diagnosis Date  . PTSD (post-traumatic stress disorder) 2016  . Anxiety   . Panic attack     There are no active problems to display for this patient.   Past Surgical History  Procedure Laterality Date  . Appendectomy      Current Outpatient Rx  Name  Route  Sig  Dispense  Refill  . albuterol (PROVENTIL HFA;VENTOLIN HFA) 108 (90 Base) MCG/ACT inhaler   Inhalation   Inhale 2 puffs into the lungs every 6 (six) hours as needed for wheezing or shortness of breath.   1 Inhaler   2   . clindamycin (CLEOCIN) 300 MG  capsule   Oral   Take 1 capsule (300 mg total) by mouth 3 (three) times daily.   30 capsule   0   . clonazePAM (KLONOPIN) 0.5 MG tablet   Oral   Take 0.5 tablets (0.25 mg total) by mouth 2 (two) times daily as needed for anxiety.   3 tablet   0   . escitalopram (LEXAPRO) 10 MG tablet   Oral   Take 10 mg by mouth daily.          . fluconazole (DIFLUCAN) 150 MG tablet      Take 1 tab PO once, repeat in 3 days if no better   2 tablet   0   . guaiFENesin (MUCINEX) 600 MG 12 hr tablet   Oral   Take 1 tablet (600 mg total) by mouth 2 (two) times daily.   60 tablet   2   . hydrOXYzine (VISTARIL) 25 MG capsule   Oral   Take 1 capsule (25 mg total) by mouth 3 (three) times daily as needed.   30 capsule   0   . predniSONE (DELTASONE) 10 MG tablet      Take 6 tablets day 1 then decrease by one tablet daily   21 tablet   0   . pseudoephedrine (SUDAFED) 60 MG tablet   Oral   Take 1 tablet (60 mg total) by mouth every 4 (four) hours as needed for congestion.   24 tablet   0   .  ranitidine (ZANTAC) 150 MG tablet   Oral   Take 1 tablet (150 mg total) by mouth 2 (two) times daily.   14 tablet   1   . sulfamethoxazole-trimethoprim (BACTRIM DS,SEPTRA DS) 800-160 MG tablet   Oral   Take 1 tablet by mouth 2 (two) times daily.   14 tablet   0     Allergies Asa and Reglan  No family history on file.  Social History Social History  Substance Use Topics  . Smoking status: Never Smoker   . Smokeless tobacco: Never Used  . Alcohol Use: No     Comment: occ    Review of Systems Constitutional: No fever/chills, and pre-syncope.  Positive for fatigue. Eyes: No visual changes,  ocular pain, ocular d/c,  ENT: No sore throat, itching throat, swelling of throat, dysphagia, odynophagia hearing changes, otalgia, rhinorrhea, nasal congestion, Cardiovascular: Denies chest pain and palpitations Respiratory: Denies dyspnea and cough Positive for shortness of breath and  sneezing Gastrointestinal: No abdominal pain.nausea, vomiting, hematochezia, melena, or constipation. Positive for loose stool Musculoskeletal: Negative for myalgias Skin: Negative for rash. Neurological: Negative for headaches, focal weakness or numbness.  10-point ROS otherwise negative.  ____________________________________________   PHYSICAL EXAM:  VITAL SIGNS: ED Triage Vitals  Enc Vitals Group     BP 05/14/16 1119 135/78 mmHg     Pulse Rate 05/14/16 1119 110     Resp 05/14/16 1119 18     Temp 05/14/16 1119 98.8 F (37.1 C)     Temp Source 05/14/16 1119 Oral     SpO2 05/14/16 1119 100 %     Weight 05/14/16 1119 257 lb (116.574 kg)     Height 05/14/16 1119 5\' 7"  (1.702 m)     Head Cir --      Peak Flow --      Pain Score 05/14/16 1120 6     Pain Loc --      Pain Edu? --      Excl. in GC? --     Constitutional: Alert and oriented. Well appearing and in no acute distress. Eyes: Conjunctivae are normal. PERRL. EOMI. Mild perioral edema. Head: Atraumatic, normocephalic Nose: No congestion/rhinnorhea. Mouth/Throat: Mucous membranes are moist.  Oropharynx no edema, erythema or exudate Perioral papular rash without confluence.  Neck: No stridor supple, with midline trachea Lymphatic/Immunilogical: No cervical or supraclavicular lymphadenopathy. Cardiovascular: Normal rate, regular rhythm. Grossly normal heart sounds.  Good peripheral circulation. Respiratory: Normal respiratory effort.  No retractions. Lungs CTAB. Gastrointestinal: Soft and nontender. No distention. No abdominal bruits. +normoactive bowel sounds in all 4 quadrants. Musculoskeletal: No lower extremity tenderness nor edema.  No joint effusions. Neurologic:  Normal speech and language. No gross focal neurologic deficits are appreciated. No gait instability. Skin:  Skin is warm, dry and intact. No rash noted. Psychiatric: Mood and affect are normal. Speech and behavior are  normal.  ____________________________________________   LABS (all labs ordered are listed, but only abnormal results are displayed)  Labs Reviewed - No data to display ____________________________________________  EKG  None ____________________________________________  RADIOLOGY  None ____________________________________________   PROCEDURES  Procedure(s) performed: None  Critical Care performed: No  ____________________________________________   INITIAL IMPRESSION / ASSESSMENT AND PLAN / ED COURSE  Pertinent labs & imaging results that were available during my care of the patient were reviewed by me and considered in my medical decision making (see chart for details)  Perioral dermatitis secondary to allergic reaction to food: Patient's presenting symptoms consistent with food allergy. During  ED course of management pt. Was given Decadron IM to help alleviate itching, watched for 20 min and d/c to home without complications Discharged to home with Vistaril, Zantac and 5-day taper course of prednisone. Before d/c she asked if she could get a refill on her albuterol inhaler because the PCP office that she contacted did not call her back. For this reason patient was given a prescription for her albuterol inhaler.  ____________________________________________   FINAL CLINICAL IMPRESSION(S) / ED DIAGNOSES  Final diagnoses:  Allergic reaction to food  Perioral dermatitis      NEW MEDICATIONS STARTED DURING THIS VISIT:  Discharge Medication List as of 05/14/2016 12:08 PM    START taking these medications   Details  hydrOXYzine (VISTARIL) 25 MG capsule Take 1 capsule (25 mg total) by mouth 3 (three) times daily as needed., Starting 05/14/2016, Until Discontinued, Print    predniSONE (DELTASONE) 10 MG tablet Take 6 tablets day 1 then decrease by one tablet daily, Print    ranitidine (ZANTAC) 150 MG tablet Take 1 tablet (150 mg total) by mouth 2 (two) times daily.,  Starting 05/14/2016, Until Discontinued, Print         Note:  This document was prepared using Dragon voice recognition software and may include unintentional dictation errors.   Evangeline Dakin, PA-C 05/14/16 1451  Governor Rooks, MD 05/14/16 [id number]

## 2016-05-14 NOTE — Discharge Instructions (Signed)
Allergies An allergy is an abnormal reaction to a substance by the body's defense system (immune system). Allergies can develop at any age. WHAT CAUSES ALLERGIES? An allergic reaction happens when the immune system mistakenly reacts to a normally harmless substance, called an allergen, as if it were harmful. The immune system releases antibodies to fight the substance. Antibodies eventually release a chemical called histamine into the bloodstream. The release of histamine is meant to protect the body from infection, but it also causes discomfort. An allergic reaction can be triggered by:  Eating an allergen.  Inhaling an allergen.  Touching an allergen. WHAT TYPES OF ALLERGIES ARE THERE? There are many types of allergies. Common types include:  Seasonal allergies. People with this type of allergy are usually allergic to substances that are only present during certain seasons, such as molds and pollens.  Food allergies.  Drug allergies.  Insect allergies.  Animal dander allergies. WHAT ARE SYMPTOMS OF ALLERGIES? Possible allergy symptoms include:  Swelling of the lips, face, tongue, mouth, or throat.  Sneezing, coughing, or wheezing.  Nasal congestion.  Tingling in the mouth.  Rash.  Itching.  Itchy, red, swollen areas of skin (hives).  Watery eyes.  Vomiting.  Diarrhea.  Dizziness.  Lightheadedness.  Fainting.  Trouble breathing or swallowing.  Chest tightness.  Rapid heartbeat. HOW ARE ALLERGIES DIAGNOSED? Allergies are diagnosed with a medical and family history and one or more of the following:  Skin tests.  Blood tests.  A food diary. A food diary is a record of all the foods and drinks you have in a day and of all the symptoms you experience.  The results of an elimination diet. An elimination diet involves eliminating foods from your diet and then adding them back in one by one to find out if a certain food causes an allergic reaction. HOW ARE  ALLERGIES TREATED? There is no cure for allergies, but allergic reactions can be treated with medicine. Severe reactions usually need to be treated at a hospital. HOW CAN REACTIONS BE PREVENTED? The best way to prevent an allergic reaction is by avoiding the substance you are allergic to. Allergy shots and medicines can also help prevent reactions in some cases. People with severe allergic reactions may be able to prevent a life-threatening reaction called anaphylaxis with a medicine given right after exposure to the allergen.   This information is not intended to replace advice given to you by your health care provider. Make sure you discuss any questions you have with your health care provider.   Document Released: 01/31/2003 Document Revised: 11/28/2014 Document Reviewed: 08/19/2014 Elsevier Interactive Patient Education 2016 [place] Allergy A food allergy is an abnormal reaction to a food (food allergen) by the body's defense system (immune system). Foods that commonly cause allergies are:  Milk.  Seafood.  Eggs.  Nuts.  Wheat.  Soy. CAUSES Food allergies happen when the immune system mistakenly sees a food as harmful and releases antibodies to fight it. SIGNS AND SYMPTOMS Symptoms may be mild or severe. They usually start minutes after the food is eaten, but they can occur even a few hours later. In people with a severe allergy, symptoms can start within seconds. Mild Symptoms  Nasal congestion.  Tingling in the mouth.  An itchy, red rash.  Vomiting.  Diarrhea. Severe Symptoms  Swelling of the lips, face, and tongue.  Swelling of the back of the mouth and throat.  Wheezing.  A hoarse voice.  Itchy, red, swollen  areas of skin (hives).  Dizziness or light-headedness.  Fainting.  Trouble breathing, speaking, or swallowing.  Chest tightness.  Rapid heartbeat. DIAGNOSIS A diagnosis is made with a physical exam, medical and family history, and  one or more of the following:  Skin tests.  Blood tests.  A food diary.  The results of an elimination diet. The elimination diet involves removing foods from your diet and then adding them back in, one at a time. TREATMENT There is no cure for allergies. An allergic reaction can be treated with medicines, such as:  Antihistamines.  Steroids.  Respiratory inhalers.  Epinephrine. Severe symptoms can be a sign of a life-threatening reaction called anaphylaxis, and they require immediate treatment. Severe reactions usually need to be treated at a hospital. People who have had a severe reaction may be prescribed rescue medicines to take if they are accidentally exposed to an allergen. HOME CARE INSTRUCTIONS General Instructions  Avoid the foods that you are allergic to.  Read food labels before you eat packaged items. Look for ingredients you are allergic to.  When you are at a restaurant, tell your server that you have an allergy. If you are unsure of whether a meal has an ingredient that you are allergic to, ask your server.  Take medicines only as directed by your health care provider. Do not drive until the medicine has worn off, unless your health care provider gives you approval.  Inform all health care providers that you have a food allergy.  Contact your health care provider if you want to be tested for an allergy. If you have had an anaphylactic reaction before, you should never test yourself for an allergy without your health care provider's approval. Instructions for People with Severe Allergies  Wear a medical alert bracelet or necklace that describes your allergy.  Carry your anaphylaxis kit or an epinephrine injection with you at all times. Use them as directed by your health care provider.  Make sure that you, your family members, and your employer know:  How to use an anaphylaxis kit.  How to give an epinephrine injection.  Replace your epinephrine  immediately after use, in case you have another reaction.  Seek medical care even after you take epinephrine. This is important because epinephrine can be followed by a delayed, life-threatening reaction. Instructions for People with a Potential Allergy  Follow the elimination diet as directed by your health care provider.  Keep a food diary as directed by your health care provider. Every day, write down:  What you eat and drink and when.  What symptoms you have and when. SEEK MEDICAL CARE IF:  Your symptoms have not gone away within 2 days.  Your symptoms get worse.  You develop new symptoms. SEEK IMMEDIATE MEDICAL CARE IF:  You use epinephrine.  You are having a severe allergic reaction. Symptoms of a severe reaction include:  Swelling of the lips, face, and tongue.  Swelling of the back of the mouth and throat.  Wheezing.  A hoarse voice.  Hives.  Dizziness or light-headedness.  Fainting.  Trouble breathing, speaking, or swallowing.  Chest tightness.  Rapid heartbeat.   This information is not intended to replace advice given to you by your health care provider. Make sure you discuss any questions you have with your health care provider.   Document Released: 11/04/2000 Document Revised: 11/28/2014 Document Reviewed: 08/19/2014 Elsevier Interactive Patient Education [employer].

## 2016-05-14 NOTE — ED Notes (Signed)
Periorbital edema, sneezing began today.

## 2016-05-15 ENCOUNTER — Encounter: Payer: Self-pay | Admitting: Emergency Medicine

## 2016-05-15 ENCOUNTER — Other Ambulatory Visit: Payer: Self-pay

## 2016-05-15 ENCOUNTER — Emergency Department
Admission: EM | Admit: 2016-05-15 | Discharge: 2016-05-15 | Disposition: A | Discharge status: Home / Self Care | Payer: BLUE CROSS/BLUE SHIELD | Attending: Emergency Medicine | Admitting: Emergency Medicine

## 2016-05-15 DIAGNOSIS — Z792 Long term (current) use of antibiotics: Secondary | ICD-10-CM | POA: Insufficient documentation

## 2016-05-15 DIAGNOSIS — Z8659 Personal history of other mental and behavioral disorders: Secondary | ICD-10-CM | POA: Diagnosis not present

## 2016-05-15 DIAGNOSIS — E876 Hypokalemia: Secondary | ICD-10-CM | POA: Diagnosis not present

## 2016-05-15 DIAGNOSIS — Z7952 Long term (current) use of systemic steroids: Secondary | ICD-10-CM | POA: Diagnosis not present

## 2016-05-15 DIAGNOSIS — R000 Tachycardia, unspecified: Secondary | ICD-10-CM | POA: Diagnosis present

## 2016-05-15 DIAGNOSIS — Z79899 Other long term (current) drug therapy: Secondary | ICD-10-CM | POA: Diagnosis not present

## 2016-05-15 LAB — CBC WITH DIFFERENTIAL/PLATELET
Basophils Absolute: 0.1 10*3/uL (ref 0–0.1)
Basophils Relative: 1 %
Eosinophils Absolute: 0.1 10*3/uL (ref 0–0.7)
Eosinophils Relative: 1 %
HCT: 34.3 % — ABNORMAL LOW (ref 35.0–47.0)
Hemoglobin: 11.2 g/dL — ABNORMAL LOW (ref 12.0–16.0)
Lymphocytes Relative: 38 %
Lymphs Abs: 2.9 10*3/uL (ref 1.0–3.6)
MCH: 26.8 pg (ref 26.0–34.0)
MCHC: 32.8 g/dL (ref 32.0–36.0)
MCV: 81.5 fL (ref 80.0–100.0)
Monocytes Absolute: 0.6 10*3/uL (ref 0.2–0.9)
Monocytes Relative: 7 %
Neutro Abs: 4 10*3/uL (ref 1.4–6.5)
Neutrophils Relative %: 53 %
Platelets: 194 10*3/uL (ref 150–440)
RBC: 4.2 MIL/uL (ref 3.80–5.20)
RDW: 13.8 % (ref 11.5–14.5)
WBC: 7.7 10*3/uL (ref 3.6–11.0)

## 2016-05-15 LAB — BASIC METABOLIC PANEL
Anion gap: 9 (ref 5–15)
BUN: 11 mg/dL (ref 6–20)
CO2: 25 mmol/L (ref 22–32)
Calcium: 9.7 mg/dL (ref 8.9–10.3)
Chloride: 104 mmol/L (ref 101–111)
Creatinine, Ser: 0.86 mg/dL (ref 0.44–1.00)
GFR calc Af Amer: >60 mL/min (ref >60)
GFR calc non Af Amer: >60 mL/min (ref >60)
Glucose, Bld: 103 mg/dL — ABNORMAL HIGH (ref 65–99)
Potassium: 3 mmol/L — ABNORMAL LOW (ref 3.5–5.1)
Sodium: 138 mmol/L (ref 135–145)

## 2016-05-15 LAB — POCT PREGNANCY, URINE: Preg Test, Ur: NEGATIVE

## 2016-05-15 MED ORDER — POTASSIUM CHLORIDE CRYS ER 20 MEQ PO TBCR
40.0000 meq | EXTENDED_RELEASE_TABLET | Freq: Once | ORAL | Status: AC
  Administered 2016-05-15: 40 meq via ORAL
  Filled 2016-05-15: qty 2

## 2016-05-15 MED ORDER — SODIUM CHLORIDE 0.9 % IV BOLUS (SEPSIS)
1000.0000 mL | Freq: Once | INTRAVENOUS | Status: AC
  Administered 2016-05-15: 1000 mL via INTRAVENOUS

## 2016-05-15 NOTE — ED Notes (Addendum)
Pt reports she was seen here yesterday for allergic reaction to Turmic. Given steroid injection yesterday. States today she has had palpitation and SOB. Denies pain Hx of anxiety and panic attacks pt states this does not feel like a panic attack

## 2016-05-15 NOTE — ED Provider Notes (Signed)
[facility] Emergency Department Provider Note    ____________________________________________  Time seen: ~1830  I have reviewed the triage vital signs and the nursing notes.   HISTORY  Chief Complaint Palpitations   History limited by: Not Limited   HPI Jasmine Gordon is a 30 y.o. female presents to the emergency department today because she was feeling jittery, heart palpitations and shortness of breath earlier today. The patient says that yesterday she was seen in the emergency department for a possible allergic reaction. She was given a dose of steroids at that time. She feels that she has gotten better from the allergic standpoint however today again felt jittery as well as had palpitations. Additionally she noticed her heart rate was fast was concerned she was becoming dehydrated. She states she had 2 large or loose stools earlier in the day. She denies any chest pain or abdominal pain. No fevers.  Past Medical History  Diagnosis Date  . PTSD (post-traumatic stress disorder) 2016  . Anxiety   . Panic attack     There are no active problems to display for this patient.   Past Surgical History  Procedure Laterality Date  . Appendectomy      Current Outpatient Rx  Name  Route  Sig  Dispense  Refill  . albuterol (PROVENTIL HFA;VENTOLIN HFA) 108 (90 Base) MCG/ACT inhaler   Inhalation   Inhale 2 puffs into the lungs every 6 (six) hours as needed for wheezing or shortness of breath.   1 Inhaler   2   . clindamycin (CLEOCIN) 300 MG capsule   Oral   Take 1 capsule (300 mg total) by mouth 3 (three) times daily.   30 capsule   0   . clonazePAM (KLONOPIN) 0.5 MG tablet   Oral   Take 0.5 tablets (0.25 mg total) by mouth 2 (two) times daily as needed for anxiety.   3 tablet   0   . escitalopram (LEXAPRO) 10 MG tablet   Oral   Take 10 mg by mouth daily.          . fluconazole (DIFLUCAN) 150 MG tablet      Take 1 tab PO once, repeat  in 3 days if no better   2 tablet   0   . guaiFENesin (MUCINEX) 600 MG 12 hr tablet   Oral   Take 1 tablet (600 mg total) by mouth 2 (two) times daily.   60 tablet   2   . hydrOXYzine (VISTARIL) 25 MG capsule   Oral   Take 1 capsule (25 mg total) by mouth 3 (three) times daily as needed.   30 capsule   0   . predniSONE (DELTASONE) 10 MG tablet      Take 6 tablets day 1 then decrease by one tablet daily   21 tablet   0   . pseudoephedrine (SUDAFED) 60 MG tablet   Oral   Take 1 tablet (60 mg total) by mouth every 4 (four) hours as needed for congestion.   24 tablet   0   . ranitidine (ZANTAC) 150 MG tablet   Oral   Take 1 tablet (150 mg total) by mouth 2 (two) times daily.   14 tablet   1   . sulfamethoxazole-trimethoprim (BACTRIM DS,SEPTRA DS) 800-160 MG tablet   Oral   Take 1 tablet by mouth 2 (two) times daily.   14 tablet   0     Allergies Asa and Reglan  No family  history on file.  Social History Social History  Substance Use Topics  . Smoking status: Never Smoker   . Smokeless tobacco: Never Used  . Alcohol Use: No     Comment: occ    Review of Systems  Constitutional: Negative for fever. Cardiovascular: Negative for chest pain. Respiratory: Negative for shortness of breath. Gastrointestinal: Negative for abdominal pain, vomiting and diarrhea. Neurological: Negative for headaches, focal weakness or numbness.  10-point ROS otherwise negative.  ____________________________________________   PHYSICAL EXAM:  VITAL SIGNS: ED Triage Vitals  Enc Vitals Group     BP 05/15/16 1655 120/71 mmHg     Pulse Rate 05/15/16 1655 112     Resp 05/15/16 1655 18     Temp 05/15/16 1655 99.1 F (37.3 C)     Temp Source 05/15/16 1655 Oral     SpO2 05/15/16 1655 99 %     Weight 05/15/16 1655 255 lb (115.667 kg)     Height 05/15/16 1655  (1.702 m)   Constitutional: Alert and oriented. Well appearing and in no distress. Eyes: Conjunctivae are normal.  PERRL. Normal extraocular movements. ENT   Head: Normocephalic and atraumatic.   Nose: No congestion/rhinnorhea.   Mouth/Throat: Mucous membranes are moist.   Neck: No stridor. Hematological/Lymphatic/Immunilogical: No cervical lymphadenopathy. Cardiovascular: Tachycardic, regular rhythm.  No murmurs, rubs, or gallops. Respiratory: Normal respiratory effort without tachypnea nor retractions. Breath sounds are clear and equal bilaterally. No wheezes/rales/rhonchi. Gastrointestinal: Soft and nontender. No distention. Genitourinary: Deferred Musculoskeletal: Normal range of motion in all extremities. No joint effusions.  No lower extremity tenderness nor edema. Neurologic:  Normal speech and language. No gross focal neurologic deficits are appreciated.  Skin:  Skin is warm, dry and intact. No rash noted. Psychiatric: Mood and affect are normal. Speech and behavior are normal. Patient exhibits appropriate insight and judgment.  ____________________________________________    LABS (pertinent positives/negatives)  Labs Reviewed  CBC WITH DIFFERENTIAL/PLATELET - Abnormal; Notable for the following:    Hemoglobin 11.2 (*)    HCT 34.3 (*)    All other components within normal limits  BASIC METABOLIC PANEL - Abnormal; Notable for the following:    Potassium 3.0 (*)    Glucose, Bld 103 (*)    All other components within normal limits  POCT PREGNANCY, URINE     ____________________________________________   EKG  I, Phineas Semen, attending physician, personally viewed and interpreted this EKG  EKG Time: 1705 Rate: 110 Rhythm: sinus tachycardia Axis: normal Intervals: qtc 446 QRS: narrow ST changes: no st elevation Impression: abnormal ekg  ____________________________________________    RADIOLOGY  None  ____________________________________________   PROCEDURES  Procedure(s) performed: None  Critical Care performed:  No  ____________________________________________   INITIAL IMPRESSION / ASSESSMENT AND PLAN / ED COURSE  Pertinent labs & imaging results that were available during my care of the patient were reviewed by me and considered in my medical decision making (see chart for details).  Patient presented to the emergency department today because of concerns for heart palpitations and jitteriness. She was tachycardic on initial exam. She also mentioned she had a couple large looser bowel movements. Patient was given IV fluids and did feel better after fluid administration. This also helped resolve the tachycardia. Patient's potassium was a little low blood work which I think could be explained by GI losses. She was repleted here in the emergency department. The patient's jitteriness and symptoms could be secondary to the steroids. At this point I did discuss with patient that  I thought it would be okay for her to withhold the further steroids that she was prescribed unless allergic symptoms return. Patient was comfortable with the plan. Will give primary care follow-up.  ____________________________________________   FINAL CLINICAL IMPRESSION(S) / ED DIAGNOSES  Final diagnoses:  Hypokalemia  Tachycardia     Note: This dictation was prepared with Dragon dictation. Any transcriptional errors that result from this process are unintentional    Phineas SemenGraydon Hertha Gergen, MD 05/15/16 [id number]

## 2016-05-15 NOTE — Discharge Instructions (Signed)
Please seek medical attention for any high fevers, chest pain, shortness of breath, change in behavior, persistent vomiting, bloody stool or any other new or concerning symptoms. ° ° °Hypokalemia °Hypokalemia means that the amount of potassium in the blood is lower than normal. Potassium is a chemical, called an electrolyte, that helps regulate the amount of fluid in the body. It also stimulates muscle contraction and helps nerves function properly. Most of the body's potassium is inside of cells, and only a very small amount is in the blood. Because the amount in the blood is so small, minor changes can be life-threatening. °CAUSES °· Antibiotics. °· Diarrhea or vomiting. °· Using laxatives too much, which can cause diarrhea. °· Chronic kidney disease. °· Water pills (diuretics). °· Eating disorders (bulimia). °· Low magnesium level. °· Sweating a lot. °SIGNS AND SYMPTOMS °· Weakness. °· Constipation. °· Fatigue. °· Muscle cramps. °· Mental confusion. °· Skipped heartbeats or irregular heartbeat (palpitations). °· Tingling or numbness. °DIAGNOSIS  °Your health care provider can diagnose hypokalemia with blood tests. In addition to checking your potassium level, your health care provider may also check other lab tests. °TREATMENT °Hypokalemia can be treated with potassium supplements taken by mouth or adjustments in your current medicines. If your potassium level is very low, you may need to get potassium through a vein (IV) and be monitored in the hospital. A diet high in potassium is also helpful. Foods high in potassium are: °· Nuts, such as peanuts and pistachios. °· Seeds, such as sunflower seeds and pumpkin seeds. °· Peas, lentils, and lima beans. °· Whole grain and bran cereals and breads. °· Fresh fruit and vegetables, such as apricots, avocado, bananas, cantaloupe, kiwi, oranges, tomatoes, asparagus, and potatoes. °· Orange and tomato juices. °· Red meats. °· Fruit yogurt. °HOME CARE INSTRUCTIONS °· Take  all medicines as prescribed by your health care provider. °· Maintain a healthy diet by including nutritious food, such as fruits, vegetables, nuts, whole grains, and lean meats. °· If you are taking a laxative, be sure to follow the directions on the label. °SEEK MEDICAL CARE IF: °· Your weakness gets worse. °· You feel your heart pounding or racing. °· You are vomiting or having diarrhea. °· You are diabetic and having trouble keeping your blood glucose in the normal range. °SEEK IMMEDIATE MEDICAL CARE IF: °· You have chest pain, shortness of breath, or dizziness. °· You are vomiting or having diarrhea for more than 2 days. °· You faint. °MAKE SURE YOU:  °· Understand these instructions. °· Will watch your condition. °· Will get help right away if you are not doing well or get worse. °  °This information is not intended to replace advice given to you by your health care provider. Make sure you discuss any questions you have with your health care provider. °  °Document Released: 11/07/2005 Document Revised: 11/28/2014 Document Reviewed: 05/10/2013 °Elsevier Interactive Patient Education ©2016 [employer] Inc[employer] ° °

## 2016-06-13 ENCOUNTER — Encounter: Payer: Self-pay | Admitting: Emergency Medicine

## 2016-06-13 ENCOUNTER — Emergency Department
Admission: EM | Admit: 2016-06-13 | Discharge: 2016-06-13 | Disposition: A | Discharge status: Home / Self Care | Payer: BLUE CROSS/BLUE SHIELD | Attending: Emergency Medicine | Admitting: Emergency Medicine

## 2016-06-13 DIAGNOSIS — Z7952 Long term (current) use of systemic steroids: Secondary | ICD-10-CM | POA: Diagnosis not present

## 2016-06-13 DIAGNOSIS — R5383 Other fatigue: Secondary | ICD-10-CM

## 2016-06-13 DIAGNOSIS — Z79899 Other long term (current) drug therapy: Secondary | ICD-10-CM | POA: Diagnosis not present

## 2016-06-13 DIAGNOSIS — Z792 Long term (current) use of antibiotics: Secondary | ICD-10-CM | POA: Insufficient documentation

## 2016-06-13 LAB — URINALYSIS COMPLETE WITH MICROSCOPIC (ARMC ONLY)
Bacteria, UA: NONE SEEN
Bilirubin Urine: NEGATIVE
Glucose, UA: NEGATIVE mg/dL
Ketones, ur: NEGATIVE mg/dL
Nitrite: NEGATIVE
Protein, ur: NEGATIVE mg/dL
Specific Gravity, Urine: 1.024 (ref 1.005–1.030)
pH: 7 (ref 5.0–8.0)

## 2016-06-13 LAB — BASIC METABOLIC PANEL
Anion gap: 5 (ref 5–15)
BUN: 11 mg/dL (ref 6–20)
CO2: 21 mmol/L — ABNORMAL LOW (ref 22–32)
Calcium: 9.4 mg/dL (ref 8.9–10.3)
Chloride: 107 mmol/L (ref 101–111)
Creatinine, Ser: 0.7 mg/dL (ref 0.44–1.00)
GFR calc Af Amer: >60 mL/min (ref >60)
GFR calc non Af Amer: >60 mL/min (ref >60)
Glucose, Bld: 95 mg/dL (ref 65–99)
Potassium: 3.9 mmol/L (ref 3.5–5.1)
Sodium: 133 mmol/L — ABNORMAL LOW (ref 135–145)

## 2016-06-13 LAB — CBC
HCT: 36.2 % (ref 35.0–47.0)
Hemoglobin: 11.5 g/dL — ABNORMAL LOW (ref 12.0–16.0)
MCH: 25.7 pg — ABNORMAL LOW (ref 26.0–34.0)
MCHC: 31.7 g/dL — ABNORMAL LOW (ref 32.0–36.0)
MCV: 81.1 fL (ref 80.0–100.0)
Platelets: 210 10*3/uL (ref 150–440)
RBC: 4.46 MIL/uL (ref 3.80–5.20)
RDW: 14 % (ref 11.5–14.5)
WBC: 4.1 10*3/uL (ref 3.6–11.0)

## 2016-06-13 LAB — POCT PREGNANCY, URINE: Preg Test, Ur: NEGATIVE

## 2016-06-13 NOTE — ED Notes (Addendum)
Pt states the she has been feeling lightheaded and jittery for the last two days.  Pt thinks it might be due to overworking, she works at a [employer] near [employer].  Pt thinks her period might be coming in the next few days. Pt has an IUD but wants a pregnancy test just in case.

## 2016-06-13 NOTE — Discharge Instructions (Signed)
You were evaluated for generalized fatigue, and although no certain cause was found, it sounds like you have been under a lot of stress recently.  You examine evaluation are reassuring in the emergency department today.  Return to the emergency room for any new or worsening condition including one-sided weakness or numbness, confusion or altered mental status, dizziness or passing out, chest pain, trouble breathing, or any other symptoms concerning to you.

## 2016-06-13 NOTE — ED Provider Notes (Signed)
[facility] Emergency Department Provider Note   ____________________________________________  Time seen: Approximately 11:45 AM I have reviewed the triage vital signs and the triage nursing note.  HISTORY  Chief Complaint Dizziness and Fatigue   Historian Patient  HPI Jasmine Gordon is a 30 y.o. female with a history reportedly for anxiety and PTSD, is here stating that for the last week or so she has felt some jitteriness in her hands and feet, exhaustion in terms of feeling like she needs more sleep because she has not been getting enough sleep given that she is in school and also recently her job at [employer] has increased from 2-5 days per week.   She just made an appointment with a new primary care physician for this Friday.  This morning she just felt extremely exhausted when she woke up. No chest pain or palpitations. Occasionally she'll shortness of breath, but no shortness of breath now or pleuritic chest pain. No coughing. No history of asthma.  Symptoms are moderate, nothing makes it worse or better.    Past Medical History:  Diagnosis Date  . Anxiety   . Panic attack   . PTSD (post-traumatic stress disorder) 2016    There are no active problems to display for this patient.   Past Surgical History:  Procedure Laterality Date  . APPENDECTOMY      Current Outpatient Rx  . Order #: [id number] Class: Print  . Order #: [id number] Class: Print  . Order #: [id number] Class: Print  . Order #: [id number] Class: Historical Med  . Order #: [id number] Class: Print  . Order #: [id number] Class: Print  . Order #: [id number] Class: Print  . Order #: [id number] Class: Print  . Order #: [id number] Class: Print  . Order #: [id number] Class: Print  . Order #: [id number] Class: Print    Allergies Asa [aspirin] and Reglan [metoclopramide]  No family history on file.  Social History Social History  Substance Use Topics  . Smoking status: Never Smoker  .  Smokeless tobacco: Never Used  . Alcohol use No     Comment: occ    Review of Systems  Constitutional: Negative for fever. Eyes: Negative for visual changes. ENT: Negative for sore throat. Cardiovascular: Negative for chest pain. Respiratory: Negative for shortness of breath. Gastrointestinal: Negative for abdominal pain, vomiting and diarrhea. Genitourinary: Negative for dysuria. Musculoskeletal: Negative for back pain. Skin: Negative for rash. Neurological: Negative for headache. 10 point Review of Systems otherwise negative ____________________________________________   PHYSICAL EXAM:  VITAL SIGNS: ED Triage Vitals  Enc Vitals Group     BP 06/13/16 1015 126/90     Pulse Rate 06/13/16 1015 96     Resp 06/13/16 1015 18     Temp 06/13/16 1015 98.7 F (37.1 C)     Temp Source 06/13/16 1015 Oral     SpO2 06/13/16 1015 99 %     Weight 06/13/16 1012 258 lb (117 kg)     Height 06/13/16 1012 5\' 7"  (1.702 m)     Head Circumference --      Peak Flow --      Pain Score 06/13/16 1012 0     Pain Loc --      Pain Edu? --      Excl. in GC? --      Constitutional: Alert and oriented. Well appearing and in no distress. HEENT   Head: Normocephalic and atraumatic.      Eyes: Conjunctivae are normal. PERRL. Normal extraocular movements.      Ears:  Nose: No congestion/rhinnorhea.   Mouth/Throat: Mucous membranes are moist.   Neck: No stridor. Cardiovascular/Chest: Normal rate, regular rhythm.  No murmurs, rubs, or gallops. Respiratory: Normal respiratory effort without tachypnea nor retractions. Breath sounds are clear and equal bilaterally. No wheezes/rales/rhonchi. Gastrointestinal: Soft. No distention, no guarding, no rebound. Nontender.  Obese.  Genitourinary/rectal:Deferred Musculoskeletal: Nontender with normal range of motion in all extremities. No joint effusions.  No lower extremity tenderness.  No edema. Neurologic:  Normal speech and language. No  gross or focal neurologic deficits are appreciated. Skin:  Skin is warm, dry and intact. No rash noted. Psychiatric: Mood and affect are normal. Speech and behavior are normal. Patient exhibits appropriate insight and judgment.  ____________________________________________   EKG I, Governor Rooks, MD, the attending physician have personally viewed and interpreted all ECGs.  None ____________________________________________  LABS (pertinent positives/negatives)  Labs Reviewed  CBC - Abnormal; Notable for the following:       Result Value   Hemoglobin 11.5 (*)    MCH 25.7 (*)    MCHC 31.7 (*)    All other components within normal limits  URINALYSIS COMPLETEWITH MICROSCOPIC ([facility] ONLY) - Abnormal; Notable for the following:    Color, Urine YELLOW (*)    APPearance CLEAR (*)    Hgb urine dipstick 1+ (*)    Leukocytes, UA 2+ (*)    Squamous Epithelial / LPF 0-5 (*)    All other components within normal limits  BASIC METABOLIC PANEL  POC URINE PREG, ED  POCT PREGNANCY, URINE    ____________________________________________  RADIOLOGY All Xrays were viewed by me. Imaging interpreted by Radiologist.  None __________________________________________  PROCEDURES  Procedure(s) performed: None  Critical Care performed: None  ____________________________________________   ED COURSE / ASSESSMENT AND PLAN  Pertinent labs & imaging results that were available during my care of the patient were reviewed by me and considered in my medical decision making (see chart for details).    This patient is here for her was only generalized fatigue. 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  She does have a history of anxiety.  No symptoms today me concerned about a cardiac or pulmonary emergency. She has some mild stable chronic anemia around 13. No elevated white  blood cell count. Normal electrolytes. Urinalysis does not look consistent with UTI but I will send a culture. Her pregnancy test is negative.  I discussed with her reassuring exam and evaluation, and recommend outpatient follow-up with primary care as well as mental health evaluation.    CONSULTATIONS:  None   Patient / Family / Caregiver informed of clinical course, medical decision-making process, and agree with plan.   I discussed return precautions, follow-up instructions, and discharged instructions with patient and/or family.   ___________________________________________   FINAL CLINICAL IMPRESSION(S) / ED DIAGNOSES   Final diagnoses:  Other fatigue              Note: This dictation was prepared with Dragon dictation. Any transcriptional errors that result from this process are unintentional    Governor Rooks, MD 06/13/16 1209

## 2016-06-13 NOTE — ED Triage Notes (Signed)
Patient presents to the ED with dizziness x 2-3 days and fatigue.  Patient states, "I thought I was just over-worked because it's really hot where I work. But it hasn't gone away.

## 2016-06-17 ENCOUNTER — Other Ambulatory Visit: Payer: Self-pay | Admitting: Family Medicine

## 2016-06-17 ENCOUNTER — Ambulatory Visit (INDEPENDENT_AMBULATORY_CARE_PROVIDER_SITE_OTHER): Payer: BLUE CROSS/BLUE SHIELD | Admitting: Family Medicine

## 2016-06-17 ENCOUNTER — Encounter: Payer: Self-pay | Admitting: Family Medicine

## 2016-06-17 VITALS — BP 120/83 | HR 94 | Temp 99.1°F | Resp 16 | Ht 67.0 in | Wt 258.6 lb

## 2016-06-17 DIAGNOSIS — E785 Hyperlipidemia, unspecified: Secondary | ICD-10-CM | POA: Diagnosis not present

## 2016-06-17 DIAGNOSIS — F419 Anxiety disorder, unspecified: Secondary | ICD-10-CM

## 2016-06-17 DIAGNOSIS — Z6841 Body Mass Index (BMI) 40.0 and over, adult: Secondary | ICD-10-CM | POA: Diagnosis not present

## 2016-06-17 DIAGNOSIS — J4530 Mild persistent asthma, uncomplicated: Secondary | ICD-10-CM | POA: Insufficient documentation

## 2016-06-17 DIAGNOSIS — F418 Other specified anxiety disorders: Secondary | ICD-10-CM | POA: Diagnosis not present

## 2016-06-17 DIAGNOSIS — F329 Major depressive disorder, single episode, unspecified: Secondary | ICD-10-CM | POA: Insufficient documentation

## 2016-06-17 DIAGNOSIS — F32A Depression, unspecified: Secondary | ICD-10-CM | POA: Insufficient documentation

## 2016-06-17 LAB — CBC WITH DIFFERENTIAL/PLATELET
Basophils Absolute: 43 cells/uL (ref 0–200)
Basophils Relative: 1 %
Eosinophils Absolute: 86 cells/uL (ref 15–500)
Eosinophils Relative: 2 %
HCT: 34.7 % — ABNORMAL LOW (ref 35.0–45.0)
Hemoglobin: 11.3 g/dL — ABNORMAL LOW (ref 11.7–15.5)
Lymphocytes Relative: 44 %
Lymphs Abs: 1892 cells/uL (ref 850–3900)
MCH: 25.7 pg — ABNORMAL LOW (ref 27.0–33.0)
MCHC: 32.6 g/dL (ref 32.0–36.0)
MCV: 79 fL — ABNORMAL LOW (ref 80.0–100.0)
MPV: 10.6 fL (ref 7.5–12.5)
Monocytes Absolute: 258 cells/uL (ref 200–950)
Monocytes Relative: 6 %
Neutro Abs: 2021 cells/uL (ref 1500–7800)
Neutrophils Relative %: 47 %
Platelets: 257 10*3/uL (ref 140–400)
RBC: 4.39 MIL/uL (ref 3.80–5.10)
RDW: 13.9 % (ref 11.0–15.0)
WBC: 4.3 10*3/uL (ref 3.8–10.8)

## 2016-06-17 MED ORDER — FLUTICASONE PROPIONATE HFA 220 MCG/ACT IN AERO: 1.0000 | INHALATION_SPRAY | Freq: Two times a day (BID) | RESPIRATORY_TRACT | 12 refills | Status: DC

## 2016-06-17 MED ORDER — ALBUTEROL SULFATE HFA 108 (90 BASE) MCG/ACT IN AERS
2.0000 | INHALATION_SPRAY | Freq: Four times a day (QID) | RESPIRATORY_TRACT | 11 refills | Status: DC | PRN
Start: 2016-06-17 — End: 2018-08-20

## 2016-06-17 NOTE — Assessment & Plan Note (Signed)
Discussed goals for weight loss and options to assist with weight loss. Pt will return 4 weeks for weight loss visit.

## 2016-06-17 NOTE — Assessment & Plan Note (Signed)
Mild persistent. Add ICS twice daily. Reviewed alarm symptoms. Renewed inhaler. Recheck 1 mos.

## 2016-06-17 NOTE — Assessment & Plan Note (Signed)
Currently managed at [facility]. Continue to follow their advice. Check TSH, vitamin D, and vitamin B12.
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
## 2016-06-17 NOTE — Patient Instructions (Signed)
Let's start Flovent for your asthma today. Take 1 puff twice daily to help with your symptoms. Use albuterol as needed.  We will check some lab work today to determine if there was anything causing your weight to stay on or anxiety symptoms.   Please seek immediate medical attention at ER or Urgent Care if you develop: Chest pain, pressure or tightness. Shortness of breath accompanied by nausea or diaphoresis Visual changes Numbness or tingling on one side of the body Facial droop Altered mental status Or any concerning symptoms.

## 2016-06-18 LAB — COMPLETE METABOLIC PANEL WITH GFR
ALT: 7 U/L (ref 6–29)
AST: 12 U/L (ref 10–30)
Albumin: 4.3 g/dL (ref 3.6–5.1)
Alkaline Phosphatase: 46 U/L (ref 33–115)
BUN: 11 mg/dL (ref 7–25)
CO2: 24 mmol/L (ref 20–31)
Calcium: 9.2 mg/dL (ref 8.6–10.2)
Chloride: 106 mmol/L (ref 98–110)
Creat: 0.73 mg/dL (ref 0.50–1.10)
GFR, Est African American: >89 mL/min (ref >=60)
GFR, Est Non African American: >89 mL/min (ref >=60)
Glucose, Bld: 95 mg/dL (ref 65–99)
Potassium: 4.1 mmol/L (ref 3.5–5.3)
Sodium: 138 mmol/L (ref 135–146)
Total Bilirubin: 0.3 mg/dL (ref 0.2–1.2)
Total Protein: 6.9 g/dL (ref 6.1–8.1)

## 2016-06-18 LAB — VITAMIN B12: Vitamin B-12: 435 pg/mL (ref 200–1100)

## 2016-06-18 LAB — TSH: TSH: 1.51 mIU/L

## 2016-06-18 LAB — LIPID PANEL
Cholesterol: 191 mg/dL (ref 125–200)
HDL: 42 mg/dL — ABNORMAL LOW (ref >=46)
LDL Cholesterol: 131 mg/dL — ABNORMAL HIGH (ref <130)
Total CHOL/HDL Ratio: 4.5 Ratio (ref <=5.0)
Triglycerides: 90 mg/dL (ref <150)
VLDL: 18 mg/dL (ref <30)

## 2016-06-18 LAB — VITAMIN D 25 HYDROXY (VIT D DEFICIENCY, FRACTURES): Vit D, 25-Hydroxy: 17 ng/mL — ABNORMAL LOW (ref 30–100)

## 2016-06-20 LAB — IRON AND TIBC
%SAT: 16 % (ref 11–50)
Iron: 52 ug/dL (ref 40–190)
TIBC: 324 ug/dL (ref 250–450)
UIBC: 272 ug/dL (ref 125–400)

## 2016-06-20 LAB — FERRITIN: Ferritin: 26 ng/mL (ref 10–154)

## 2016-06-22 ENCOUNTER — Telehealth: Payer: Self-pay | Admitting: Family Medicine

## 2016-06-22 MED ORDER — VITAMIN D (ERGOCALCIFEROL) 1.25 MG (50000 UNIT) PO CAPS: 50000.0000 [IU] | ORAL_CAPSULE | ORAL | 1 refills | Status: DC

## 2016-06-22 NOTE — Telephone Encounter (Signed)
Pt checked with pharmacy and the prescription for Vit D was not sent over.  They also told her that insurance would not pay for inhaler with speaking to Amy.  She uses [facility] on [address].  Her call back number is [phone]

## 2016-06-22 NOTE — Telephone Encounter (Signed)
PA for Flovent has been submitted.Abie

## 2016-06-22 NOTE — Telephone Encounter (Signed)
Vitamin D is sent. Is there an inhaler her insurance prefers? I am happy to change to their preferred inhaler for asthma.  Thanks! AK

## 2016-06-22 NOTE — Telephone Encounter (Signed)
Patient has [employer] she was informed inhaler covered thru them. She needs to give call [facility] and give insurance information. Also notified patient that Vit d was sent today.

## 2016-06-27 ENCOUNTER — Ambulatory Visit (INDEPENDENT_AMBULATORY_CARE_PROVIDER_SITE_OTHER): Payer: BLUE CROSS/BLUE SHIELD | Admitting: Family Medicine

## 2016-06-27 VITALS — BP 117/74 | HR 100 | Temp 98.4°F | Ht 67.0 in | Wt 258.6 lb

## 2016-06-27 DIAGNOSIS — D500 Iron deficiency anemia secondary to blood loss (chronic): Secondary | ICD-10-CM

## 2016-06-27 DIAGNOSIS — Z6841 Body Mass Index (BMI) 40.0 and over, adult: Secondary | ICD-10-CM

## 2016-06-27 DIAGNOSIS — N920 Excessive and frequent menstruation with regular cycle: Secondary | ICD-10-CM | POA: Diagnosis not present

## 2016-06-27 MED ORDER — NALTREXONE-BUPROPION HCL ER 8-90 MG PO TB12: ORAL_TABLET | ORAL | 2 refills | Status: DC

## 2016-06-27 MED ORDER — FERROUS SULFATE 325 (65 FE) MG PO TABS: 325.0000 mg | ORAL_TABLET | Freq: Two times a day (BID) | ORAL | 3 refills | Status: DC

## 2016-06-27 NOTE — Assessment & Plan Note (Signed)
Likely source of anemia. Will see if symptoms improved with iron replacement. Will plan to send pt to OB-GYN if symptoms continue to discuss options to control heavy periods.

## 2016-06-27 NOTE — Assessment & Plan Note (Signed)
Will plan to start contrave for a 3 mos trial. Reviewed risks vs benefits with patient. Encouraged diet and lifestyle changes. Co-pay card given. Recheck 6 weeks.

## 2016-06-27 NOTE — Patient Instructions (Signed)
Contrave:  Week 1: 1 tablets by mouth in the morning  Week 2: Take 1 tablet in the morning and 1 tablet in the evening. Week 3: Take 2 tablets in the morning and 1 tablet in the evening. Week 4: Take 2 tablets in the morning and 2 tablets in the evening.   Please try to meet the goal of 150 minutes of exercise per week.  This is generally 30-40 minutes of moderate activity 3-4 times per week. Consider a calorie goal of 1800 per day. Eat mainly fruits, vegetables, and lean proteins (chicken or fish).  A great resource for healthy living and weight loss is Choosemyplate.gov

## 2016-06-27 NOTE — Progress Notes (Signed)
Subjective:    Patient ID: Jasmine Gordon, female    DOB: 1986/03/25, 30 y.o.   MRN: [id number]  HPI: Jasmine Gordon is a 30 y.o. female presenting on 06/27/2016 for Weight Loss   HPI  Pt presents to discuss weight loss. She is trying to exercise daily. Is concerned about hear weight and would like to lose weight. Has stopped taking her zoloft several weeks ago. Works late nights at her job. Tries to eat mainly vegetables and drink water. No personal history of seizures. No suicidal thoughts.   Found to have mild anemia with normal iron on CBC. Having fatigue- especially after period. She does endorse heavy periods with clots on her paraguard.    Past Medical History:  Diagnosis Date  . Allergy   . Anemia   . Anxiety   . Asthma   . Bronchiolitis   . Depression   . Migraine   . Panic attack   . PTSD (post-traumatic stress disorder) 2016    Current Outpatient Prescriptions on File Prior to Visit  Medication Sig  . albuterol (PROVENTIL HFA;VENTOLIN HFA) 108 (90 Base) MCG/ACT inhaler Inhale 2 puffs into the lungs every 6 (six) hours as needed for wheezing or shortness of breath.  . Vitamin D, Ergocalciferol, (DRISDOL) 50000 units CAPS capsule Take 1 capsule (50,000 Units total) by mouth every 7 (seven) days.  . [DISCONTINUED] famotidine (PEPCID) 40 MG tablet Take 1 tablet (40 mg total) by mouth every evening. (Patient not taking: Reported on 12/28/2015)   No current facility-administered medications on file prior to visit.     Review of Systems  Constitutional: Positive for fatigue. Negative for chills and fever.  HENT: Negative.   Respiratory: Negative for cough, chest tightness and wheezing.   Cardiovascular: Negative for chest pain and leg swelling.  Gastrointestinal: Negative for abdominal pain, constipation, diarrhea, nausea and vomiting.  Endocrine: Negative.  Negative for cold intolerance, heat intolerance, polydipsia, polyphagia and polyuria.  Genitourinary:  Positive for menstrual problem (heavy periods). Negative for difficulty urinating and dysuria.  Musculoskeletal: Negative.   Neurological: Negative for dizziness, light-headedness and numbness.  Psychiatric/Behavioral: Negative.    Per HPI unless specifically indicated above     Objective:    BP 117/74 (BP Location: Left Arm, Patient Position: Sitting, Cuff Size: Large)   Pulse 100   Temp 98.4 F (36.9 C) (Oral)   Ht 5\' 7"  (1.702 m)   Wt 258 lb 9.6 oz (117.3 kg)   LMP 06/17/2016 (Exact Date)   BMI 40.50 kg/m   Wt Readings from Last 3 Encounters:  06/27/16 258 lb 9.6 oz (117.3 kg)  06/17/16 258 lb 9.6 oz (117.3 kg)  06/13/16 258 lb (117 kg)    Physical Exam  Constitutional: She is oriented to person, place, and time. She appears well-developed and well-nourished.  HENT:  Head: Normocephalic and atraumatic.  Neck: Neck supple.  Cardiovascular: Normal rate, regular rhythm and normal heart sounds.  Exam reveals no gallop and no friction rub.   No murmur heard. Pulmonary/Chest: Effort normal and breath sounds normal. She has no wheezes. She exhibits no tenderness.  Abdominal: Soft. Normal appearance and bowel sounds are normal. She exhibits no distension and no mass. There is no tenderness. There is no rebound and no guarding.  Musculoskeletal: Normal range of motion. She exhibits no edema or tenderness.  Lymphadenopathy:    She has no cervical adenopathy.  Neurological: She is alert and oriented to person, place, and time.  Skin: Skin is  warm and dry.   Results for orders placed or performed in visit on 06/17/16  Iron and TIBC  Result Value Ref Range   Iron 52 40 - 190 ug/dL   UIBC 782 956 - 213 ug/dL   TIBC 086 578 - 469 ug/dL   %SAT 16 11 - 50 %  Ferritin  Result Value Ref Range   Ferritin 26 10 - 154 ng/mL      Assessment & Plan:   Problem List Items Addressed This Visit      Other   BMI 40.0-44.9, adult (HCC)    Will plan to start contrave for a 3 mos trial.  Reviewed risks vs benefits with patient. Encouraged diet and lifestyle changes. Co-pay card given. Recheck 6 weeks.       Relevant Medications   Naltrexone-Bupropion HCl ER 8-90 MG TB12   Menorrhagia with regular cycle    Likely source of anemia. Will see if symptoms improved with iron replacement. Will plan to send pt to OB-GYN if symptoms continue to discuss options to control heavy periods.        Other Visit Diagnoses    Iron deficiency anemia due to chronic blood loss    -  Primary   Start oral iron.    Relevant Medications   ferrous sulfate 325 (65 FE) MG tablet      Meds ordered this encounter  Medications  . ferrous sulfate 325 (65 FE) MG tablet    Sig: Take 1 tablet (325 mg total) by mouth 2 (two) times daily with a meal.    Dispense:  60 tablet    Refill:  3    Order Specific Question:   Supervising Provider    Answer:   Janeann Forehand [phone]  . Naltrexone-Bupropion HCl ER 8-90 MG TB12    Sig: Titrate as directed and then take 2 tablets by mouth twice daily.    Dispense:  120 tablet    Refill:  2    Order Specific Question:   Supervising Provider    Answer:   Janeann Forehand [[id number]]      Follow up plan: Return in about 6 weeks (around 08/08/2016) for Weight check. Marland Kitchen

## 2016-07-14 ENCOUNTER — Encounter: Payer: Self-pay | Admitting: Family Medicine

## 2016-07-15 ENCOUNTER — Encounter: Payer: Self-pay | Admitting: Family Medicine

## 2016-07-15 ENCOUNTER — Ambulatory Visit
Admission: RE | Admit: 2016-07-15 | Discharge: 2016-07-15 | Disposition: A | Discharge status: Home / Self Care | Payer: BLUE CROSS/BLUE SHIELD | Source: Ambulatory Visit | Attending: Family Medicine | Admitting: Family Medicine

## 2016-07-15 ENCOUNTER — Ambulatory Visit (INDEPENDENT_AMBULATORY_CARE_PROVIDER_SITE_OTHER): Payer: BLUE CROSS/BLUE SHIELD | Admitting: Family Medicine

## 2016-07-15 VITALS — BP 114/72 | HR 100 | Temp 98.7°F | Resp 16 | Ht 67.0 in | Wt 255.0 lb

## 2016-07-15 DIAGNOSIS — R0602 Shortness of breath: Secondary | ICD-10-CM | POA: Insufficient documentation

## 2016-07-15 DIAGNOSIS — D509 Iron deficiency anemia, unspecified: Secondary | ICD-10-CM | POA: Diagnosis not present

## 2016-07-15 DIAGNOSIS — B349 Viral infection, unspecified: Secondary | ICD-10-CM | POA: Diagnosis not present

## 2016-07-15 DIAGNOSIS — H6593 Unspecified nonsuppurative otitis media, bilateral: Secondary | ICD-10-CM

## 2016-07-15 DIAGNOSIS — N920 Excessive and frequent menstruation with regular cycle: Secondary | ICD-10-CM

## 2016-07-15 LAB — CBC WITH DIFFERENTIAL/PLATELET
Basophils Absolute: 0 cells/uL (ref 0–200)
Basophils Relative: 0 %
Eosinophils Absolute: 68 cells/uL (ref 15–500)
Eosinophils Relative: 2 %
HCT: 34.7 % — ABNORMAL LOW (ref 35.0–45.0)
Hemoglobin: 11.2 g/dL — ABNORMAL LOW (ref 11.7–15.5)
Lymphocytes Relative: 36 %
Lymphs Abs: 1224 cells/uL (ref 850–3900)
MCH: 25.7 pg — ABNORMAL LOW (ref 27.0–33.0)
MCHC: 32.3 g/dL (ref 32.0–36.0)
MCV: 79.6 fL — ABNORMAL LOW (ref 80.0–100.0)
MPV: 10.5 fL (ref 7.5–12.5)
Monocytes Absolute: 340 cells/uL (ref 200–950)
Monocytes Relative: 10 %
Neutro Abs: 1768 cells/uL (ref 1500–7800)
Neutrophils Relative %: 52 %
Platelets: 231 10*3/uL (ref 140–400)
RBC: 4.36 MIL/uL (ref 3.80–5.10)
RDW: 13.9 % (ref 11.0–15.0)
WBC: 3.4 10*3/uL — ABNORMAL LOW (ref 3.8–10.8)

## 2016-07-15 MED ORDER — FLUTICASONE PROPIONATE 50 MCG/ACT NA SUSP: 2.0000 | Freq: Every day | NASAL | 11 refills | Status: DC

## 2016-07-15 MED ORDER — PHENYLEPHRINE-ACETAMINOPHEN 5-325 MG PO TABS: 2.0000 | ORAL_TABLET | Freq: Four times a day (QID) | ORAL | 0 refills | Status: DC | PRN

## 2016-07-15 NOTE — Progress Notes (Signed)
Subjective:    Patient ID: Jasmine Gordon, female    DOB: 1986/02/28, 28 y.o.   MRN: [id number]  HPI: Jasmine Gordon is a 30 y.o. female presenting on 07/15/2016 for Shortness of Breath (pt takning contrave and think that side effect of flu like SX, has HA thinks could be dehydrated)   HPI  Pt presents for 3 days of fatigue, malaise, dizziness, and some shortness of breath. Feels hard to take a deep breath. No nasal drainage. Felt achy yesterday. No fevers- but felt flushed. Shortness of breath is coming and going over past 3 days. She feels short of breath usually with her cycles.  Chest does not feel tight or heavy. Has not tried her inhaler. She has had several sick contacts at work.  She has started her menstrual cycle- and reports symptoms occur on her menstrual cycle. Started yesterday. She does have heavy periods. A few small clots. Taking iron daily. Periods are sometimes heavy. Had a copper IUD placed in Feb 2017.  Pt does have asthma- has not tired her inhaler. Is also worried she is dehydrated.    Past Medical History:  Diagnosis Date  . Allergy   . Anemia   . Anxiety   . Asthma   . Bronchiolitis   . Depression   . Migraine   . Panic attack   . PTSD (post-traumatic stress disorder) 2016    Current Outpatient Prescriptions on File Prior to Visit  Medication Sig  . albuterol (PROVENTIL HFA;VENTOLIN HFA) 108 (90 Base) MCG/ACT inhaler Inhale 2 puffs into the lungs every 6 (six) hours as needed for wheezing or shortness of breath.  . ferrous sulfate 325 (65 FE) MG tablet Take 1 tablet (325 mg total) by mouth 2 (two) times daily with a meal.  . Naltrexone-Bupropion HCl ER 8-90 MG TB12 Titrate as directed and then take 2 tablets by mouth twice daily.  . Vitamin D, Ergocalciferol, (DRISDOL) 50000 units CAPS capsule Take 1 capsule (50,000 Units total) by mouth every 7 (seven) days.  . [DISCONTINUED] famotidine (PEPCID) 40 MG tablet Take 1 tablet (40 mg total) by mouth  every evening. (Patient not taking: Reported on 12/28/2015)   No current facility-administered medications on file prior to visit.     Review of Systems  Constitutional: Positive for chills and fatigue. Negative for fever.  HENT: Positive for congestion and ear pain.   Respiratory: Positive for shortness of breath. Negative for cough, chest tightness and wheezing.   Cardiovascular: Negative for chest pain and leg swelling.  Gastrointestinal: Negative for abdominal pain, constipation, diarrhea, nausea and vomiting.  Endocrine: Negative.  Negative for cold intolerance, heat intolerance, polydipsia, polyphagia and polyuria.  Genitourinary: Positive for menstrual problem. Negative for difficulty urinating and dysuria.  Musculoskeletal: Negative.   Neurological: Positive for dizziness. Negative for light-headedness and numbness.  Psychiatric/Behavioral: Negative.    Per HPI unless specifically indicated above     Objective:    BP 114/72 (BP Location: Left Arm, Patient Position: Sitting, Cuff Size: Large)   Pulse 100   Temp 98.7 F (37.1 C) (Oral)   Resp 16   Ht 5\' 7"  (1.702 m)   Wt 255 lb (115.7 kg)   LMP 07/14/2016 (Exact Date)   SpO2 100%   BMI 39.94 kg/m   Wt Readings from Last 3 Encounters:  07/15/16 255 lb (115.7 kg)  06/27/16 258 lb 9.6 oz (117.3 kg)  06/17/16 258 lb 9.6 oz (117.3 kg)    Physical Exam  Constitutional:  She is oriented to person, place, and time. She appears well-developed and well-nourished.  HENT:  Head: Normocephalic and atraumatic.  Neck: Neck supple.  Cardiovascular: Normal rate, regular rhythm and normal heart sounds.  Exam reveals no gallop and no friction rub.   No murmur heard. Pulmonary/Chest: Effort normal and breath sounds normal. She has no wheezes. She exhibits no tenderness.  Abdominal: Soft. Normal appearance and bowel sounds are normal. She exhibits no distension and no mass. There is no tenderness. There is no rebound and no guarding.    Musculoskeletal: Normal range of motion. She exhibits no edema or tenderness.  Lymphadenopathy:    She has no cervical adenopathy.  Neurological: She is alert and oriented to person, place, and time.  Skin: Skin is warm and dry.  Psychiatric: She has a normal mood and affect. Her behavior is normal. Judgment and thought content normal.   Results for orders placed or performed in visit on 06/17/16  Iron and TIBC  Result Value Ref Range   Iron 52 40 - 190 ug/dL   UIBC 161272 096125 - 045400 ug/dL   TIBC 409324 811250 - 914450 ug/dL   %SAT 16 11 - 50 %  Ferritin  Result Value Ref Range   Ferritin 26 10 - 154 ng/mL      Assessment & Plan:   Problem List Items Addressed This Visit      Other   Menorrhagia with regular cycle - Primary    Heavy periods since placement of copper IUD. Will recheck CBC to determine if iron is helping. Refer to GYN if symptoms continue.       Iron deficiency anemia    Recheck CBC as SOB could be anemia related. Will refer pt to GYN if CBC has dropped for further discussion of heavy periods.       Relevant Orders   CBC with Differential    Other Visit Diagnoses    Shortness of breath       CXR r/o pneumonia.  Anxiety vs anemia in origin. CBC pending. To ER with severe shortness of breath.    Relevant Orders   DG Chest 2 View (Completed)   Viral syndrome       Symptoms could be viral prodrome 2/2 sick contacts and pt ear congestion/facial pain. Reviewed home treatment. Reviewed alarm symptoms.    Middle ear effusion, bilateral       Flonase daily and tylenol sinus to help with congestion. Potential cause of dizzines but no other s/s of BPPV.    Relevant Medications   fluticasone (FLONASE) 50 MCG/ACT nasal spray   Phenylephrine-Acetaminophen 5-325 MG TABS      Meds ordered this encounter  Medications  . FLOVENT HFA 220 MCG/ACT inhaler    Refill:  11  . fluticasone (FLONASE) 50 MCG/ACT nasal spray    Sig: Place 2 sprays into both nostrils daily.    Dispense:   16 g    Refill:  11    Order Specific Question:   Supervising Provider    Answer:   Janeann ForehandHAWKINS JR, JAMES H [phone][[id number]]  . Phenylephrine-Acetaminophen 5-325 MG TABS    Sig: Take 2 tablets by mouth every 6 (six) hours as needed.    Dispense:  30 each    Refill:  0    Order Specific Question:   Supervising Provider    Answer:   Janeann ForehandHAWKINS JR, JAMES H [phone][[id number]]      Follow up plan: Return if symptoms worsen or fail to improve,  for keep previously scheduled. .Chest

## 2016-07-15 NOTE — Assessment & Plan Note (Signed)
Recheck CBC as SOB could be anemia related. Will refer pt to GYN if CBC has dropped for further discussion of heavy periods.

## 2016-07-15 NOTE — Patient Instructions (Signed)
We will check your blood levels today- to see if the anemia is causing your shortness of breath. We may have you see GYN to determine if your IUD is causing the heavier bleeding.   I think your symptoms may be the beginnings of a virus. You do have some congestion in your ears. Take Flonase daily. Can take Tylenol sinus for sinus congestion.  Try using inhaler for shortness of breath.   Drink plenty of water and stay hydrated.   Please seek immediate medical attention if you develop shortness of breath not relieve by inhaler, chest pain/tightness, fever > 103 F or other concerning symptoms.

## 2016-07-15 NOTE — Assessment & Plan Note (Signed)
Heavy periods since placement of copper IUD. Will recheck CBC to determine if iron is helping. Refer to GYN if symptoms continue.

## 2016-07-18 ENCOUNTER — Encounter: Payer: Self-pay | Admitting: Family Medicine

## 2016-07-28 ENCOUNTER — Emergency Department
Admission: EM | Admit: 2016-07-28 | Discharge: 2016-07-28 | Disposition: A | Discharge status: Home / Self Care | Payer: BLUE CROSS/BLUE SHIELD | Attending: Student in an Organized Health Care Education/Training Program | Admitting: Student in an Organized Health Care Education/Training Program

## 2016-07-28 ENCOUNTER — Encounter: Payer: Self-pay | Admitting: Emergency Medicine

## 2016-07-28 DIAGNOSIS — J45909 Unspecified asthma, uncomplicated: Secondary | ICD-10-CM | POA: Diagnosis not present

## 2016-07-28 DIAGNOSIS — J069 Acute upper respiratory infection, unspecified: Secondary | ICD-10-CM | POA: Insufficient documentation

## 2016-07-28 DIAGNOSIS — R0981 Nasal congestion: Secondary | ICD-10-CM | POA: Diagnosis present

## 2016-07-28 MED ORDER — CETIRIZINE HCL 10 MG PO CAPS: 10.0000 mg | ORAL_CAPSULE | Freq: Every day | ORAL | 3 refills | Status: DC

## 2016-07-28 NOTE — ED Notes (Signed)
Pt reports cold and congestion, itchy throat   - weakness and extremity fatigue  Pt reports being on weight loss medication and she sometimes feels dizzy r/t the medicine

## 2016-07-28 NOTE — ED Triage Notes (Signed)
Patient presents to the ED with body aches, throat itching, and slight cough.  Patient states, "I feel like I have sinus pressure."  Patient reports taking "contre" for weightloss.  Patient states, I don't know if it's giving me side effects or what?"

## 2016-07-28 NOTE — ED Provider Notes (Signed)
[facility] Emergency Department Provider Note  ____________________________________________  Time seen: Approximately 3:06 PM  I have reviewed the triage vital signs and the nursing notes.   HISTORY  Chief Complaint Nasal Congestion   HPI Jasmine Gordon is a 30 y.o. female who presents to the emergency department for evaluation of sinus pressure, congestion, and decreased energy level. She states symptoms started 2 days ago. She reports multiple co-workers have had similar symptoms and her son is here for evaluation for the same as well. She has not taken any medications for symptom relief because she is taking Contrave for weight loss and is afraid to mix anything with it. She denies fever.   Past Medical History:  Diagnosis Date  . Allergy   . Anemia   . Anxiety   . Asthma   . Bronchiolitis   . Depression   . Migraine   . Panic attack   . PTSD (post-traumatic stress disorder) 2016    Patient Active Problem List   Diagnosis Date Noted  . Iron deficiency anemia 07/15/2016  . Menorrhagia with regular cycle 06/27/2016  . Asthma 06/17/2016  . Anxiety and depression 06/17/2016  . BMI 40.0-44.9, adult (HCC) 06/17/2016    Past Surgical History:  Procedure Laterality Date  . APPENDECTOMY      Current Outpatient Rx  . Order #: [id number] Class: Print  . Order #: [id number] Class: Print  . Order #: [id number] Class: Normal  . Order #: [id number] Class: Historical Med  . Order #: [id number] Class: Normal  . Order #: [id number] Class: Normal  . Order #: [id number] Class: Normal  . Order #: [id number] Class: Normal    Allergies Asa [aspirin]; Reglan [metoclopramide]; and Turmeric  Family History  Problem Relation Age of Onset  . Depression Mother   . Mental retardation Mother   . Bipolar disorder Mother   . Diabetes Father   . Stroke Maternal Grandfather     Social History Social History  Substance Use Topics  . Smoking status: Never Smoker   . Smokeless tobacco: Never Used  . Alcohol use No     Comment: occ    Review of Systems Constitutional: Negative fever/chills ENT: Negative sore throat. Positive for sinus drainage and postnasal drip. Cardiovascular: Denies chest pain. Respiratory: Negative for shortness of breath. Positive for cough. Gastrointestinal: Negative for  nausea,  No vomiting.  No diarrhea.  Musculoskeletal: Positive for body aches Skin: Negative for rash. Neurological: Negative. for headaches ____________________________________________   PHYSICAL EXAM:  VITAL SIGNS: ED Triage Vitals [07/28/16 1446]  Enc Vitals Group     BP 131/85     Pulse Rate 98     Resp 18     Temp 98 F (36.7 C)     Temp Source Oral     SpO2 99 %     Weight 250 lb (113.4 kg)     Height 5\' 7"  (1.702 m)     Head Circumference      Peak Flow      Pain Score 5     Pain Loc      Pain Edu?      Excl. in GC?     Constitutional: Alert and oriented. Well appearing and in no acute distress. Eyes: Conjunctivae are normal. EOMI. Ears: bilateral TM normal Nose: Diffuse sinus congestion; clear rhinnorhea. Mouth/Throat: Mucous membranes are moist.  Oropharynx without erythema. Tonsils appear normal without exudate. Cobblestone exudate on posterior pharynx.  Neck: No stridor.  Lymphatic: No cervical lymphadenopathy. Cardiovascular: Normal rate, regular rhythm.  Grossly normal heart sounds.  Good peripheral circulation. Respiratory: Normal respiratory effort.  No retractions. Clear to auscultation throughout. Gastrointestinal: Soft and nontender.  Musculoskeletal: FROM x 4 extremities.  Neurologic:  Normal speech and language.  Skin:  Skin is warm, dry and intact. No rash noted. Psychiatric: Mood and affect are normal. Speech and behavior are normal.  ____________________________________________   LABS (all labs ordered are listed, but only abnormal results are displayed)  Labs Reviewed - No data to  display ____________________________________________  EKG   ____________________________________________  RADIOLOGY   ____________________________________________   PROCEDURES  Procedure(s) performed: None  Critical Care performed: No  ____________________________________________   INITIAL IMPRESSION / ASSESSMENT AND PLAN / ED COURSE  Clinical Course    Pertinent labs & imaging results that were available during my care of the patient were reviewed by me and considered in my medical decision making (see chart for details).   Patient was advised to take the cetirizine daily as prescribed. She is to follow up with her NP for symptoms that are not improving over the next few days. She is to return to the ER for symptoms that change or worsen if unable to schedule an appointment.  ____________________________________________   FINAL CLINICAL IMPRESSION(S) / ED DIAGNOSES  Final diagnoses:  URI (upper respiratory infection)    Note:  This document was prepared using Dragon voice recognition software and may include unintentional dictation errors.     Chinita Pester, FNP 07/28/16 1806    Willy Eddy, MD 07/28/16 [phone]

## 2016-08-08 ENCOUNTER — Ambulatory Visit (INDEPENDENT_AMBULATORY_CARE_PROVIDER_SITE_OTHER): Payer: BLUE CROSS/BLUE SHIELD | Admitting: Family Medicine

## 2016-08-08 ENCOUNTER — Encounter: Payer: Self-pay | Admitting: Family Medicine

## 2016-08-08 VITALS — BP 121/65 | HR 95 | Temp 98.9°F | Resp 16 | Ht 67.0 in | Wt 252.0 lb

## 2016-08-08 DIAGNOSIS — B070 Plantar wart: Secondary | ICD-10-CM

## 2016-08-08 DIAGNOSIS — Z23 Encounter for immunization: Secondary | ICD-10-CM

## 2016-08-08 DIAGNOSIS — Z6841 Body Mass Index (BMI) 40.0 and over, adult: Secondary | ICD-10-CM

## 2016-08-08 DIAGNOSIS — D509 Iron deficiency anemia, unspecified: Secondary | ICD-10-CM

## 2016-08-08 LAB — CBC WITH DIFFERENTIAL/PLATELET
Basophils Absolute: 0 cells/uL (ref 0–200)
Basophils Relative: 0 %
Eosinophils Absolute: 78 cells/uL (ref 15–500)
Eosinophils Relative: 2 %
HCT: 33.6 % — ABNORMAL LOW (ref 35.0–45.0)
Hemoglobin: 11 g/dL — ABNORMAL LOW (ref 11.7–15.5)
Lymphocytes Relative: 40 %
Lymphs Abs: 1560 cells/uL (ref 850–3900)
MCH: 25.6 pg — ABNORMAL LOW (ref 27.0–33.0)
MCHC: 32.7 g/dL (ref 32.0–36.0)
MCV: 78.1 fL — ABNORMAL LOW (ref 80.0–100.0)
MPV: 11.5 fL (ref 7.5–12.5)
Monocytes Absolute: 273 cells/uL (ref 200–950)
Monocytes Relative: 7 %
Neutro Abs: 1989 cells/uL (ref 1500–7800)
Neutrophils Relative %: 51 %
Platelets: 220 10*3/uL (ref 140–400)
RBC: 4.3 MIL/uL (ref 3.80–5.10)
RDW: 13.7 % (ref 11.0–15.0)
WBC: 3.9 10*3/uL (ref 3.8–10.8)

## 2016-08-08 NOTE — Assessment & Plan Note (Signed)
Lost of 6lbs since starting Contrave and pt is not up to full dose. Will continue at this time. Plan ot have patient increase slowly. Continue diet and lifestyle changes. Recheck 4 weeks at full dose to determine if medication is effective.

## 2016-08-08 NOTE — Patient Instructions (Addendum)
Keep the good work with weight loss. Start taking contrave 2 pills at bedtime and 1 pill in the AM for 1 week, increase up to 2 and 2.    Plantar Warts Warts are small growths on the skin. They can occur on various areas of the body. When they occur on the underside (sole) of the foot, they are called plantar warts. Plantar warts often occur in groups, with several small warts around a larger growth. They tend to develop over areas of pressure, such as the heel or the ball of the foot. Most warts are not painful, and they usually do not cause problems. However, plantar warts may cause pain when you walk because pressure is applied to them. Warts often go away on their own in time. Various treatments may be done if needed. Sometimes, warts go away and then they come back again. CAUSES Plantar warts are caused by a type of virus that is called human papillomavirus (HPV). HPV attacks a break in the skin of the foot. Walking barefoot can lead to exposure to the virus. These warts may spread to other areas of the sole. They spread to other areas of the body only through direct contact. RISK FACTORS Plantar warts are more likely to develop in:  People who are 29-1 years of age.  People who use public showers or locker rooms.  People who have a weakened body defense system (immune system). SYMPTOMS Plantar warts may be flat or slightly raised. They may grow into the deeper layers of skin or rise above the surface of the skin. Most plantar warts have a rough surface. They may cause pain when you use your foot to support your body weight. DIAGNOSIS A plantar wart can usually be diagnosed from its appearance. In some cases, a tissue sample may be removed (biopsy) to be looked at under a microscope. TREATMENT In many cases, warts do not need treatment. Without treatment, they often go away over a period of many months to a couple years. If treatment is needed, options may include:  Applying medicated  solutions, creams, or patches to the wart. These may be over-the-counter or prescription medicines that make the skin soft so that layers will gradually shed away. In many cases, the medicine is applied one or two times per day and covered with a bandage.  Putting duct tape over the top of the wart (occlusion). You will leave the tape in place for as long as told by your health care provider, then you will replace it with a new strip of tape. This is done until the wart goes away.  Freezing the wart with liquid nitrogen (cryotherapy).  Burning the wart with:  Laser treatment.  An electrified probe (electrocautery).  Injection of a medicine (Candida antigen) into the wart to help the body's immune system to fight off the wart.  Surgery to remove the wart. HOME CARE INSTRUCTIONS  Apply medicated creams or solutions only as told by your health care provider. This may involve:  Soaking the affected area in warm water.  Removing the top layer of softened skin before you apply the medicine. A pumice stone works well for removing the tissue.  Applying a bandage over the affected area after you apply the medicine.  Repeating the process daily or as told by your health care provider.  Do not scratch or pick at a wart.  Wash your hands after you touch a wart.  If a wart is painful, try applying a bandage with  a hole in the middle over the wart. The helps to take pressure off the wart.  Keep all follow-up visits as told by your health care provider. This is important. PREVENTION Take these actions to help prevent warts:  Wear shoes and socks. Change your socks daily.  Keep your feet clean and dry.  Check your feet regularly.  Avoid direct contact with warts on other people. SEEK MEDICAL CARE IF:  Your warts do not improve after treatment.  You have redness, swelling, or pain at the site of a wart.  You have bleeding from a wart that does not stop with light pressure.  You  have diabetes and you develop a wart.   This information is not intended to replace advice given to you by your health care provider. Make sure you discuss any questions you have with your health care provider.   Document Released: 01/28/2004 Document Revised: 07/29/2015 Document Reviewed: 02/02/2015 [employer] Interactive Patient Education [employer]2016 [employer]

## 2016-08-08 NOTE — Progress Notes (Signed)
Subjective:    Patient ID: Jasmine Gordon, female    DOB: [birth year], 30 y.o.   MRN: [id number]  HPI: Jasmine Gordon is a 30 y.o. female presenting on 08/08/2016 for Weight Check   HPI  Pt presents for weight check after being on Contrave. She started on August 14.  Only taking 1 in the AM and 1 in the PM. Has not titrated up. Side effects are easing off. Has lost 3% of her body weight with just the low dose. Goal weight is 60-80lb weight lossWalking daily 30-45 minutes per day. Light weights at the house. Got a membership to [employer]. Trying to watch calories at home. Is trying to avoid pizza.  Anemia: Did not see GYN to discuss heavy periods. They have gotten lighter. Still having shortness of breath at the start of period. Is taking iron daily- has more energy. However CBC has not improved.  Pt also has plantar warts on the bottom of her L foot she would like removed.   Past Medical History:  Diagnosis Date  . Allergy   . Anemia   . Anxiety   . Asthma   . Bronchiolitis   . Depression   . Migraine   . Panic attack   . PTSD (post-traumatic stress disorder) 2016    Current Outpatient Prescriptions on File Prior to Visit  Medication Sig  . albuterol (PROVENTIL HFA;VENTOLIN HFA) 108 (90 Base) MCG/ACT inhaler Inhale 2 puffs into the lungs every 6 (six) hours as needed for wheezing or shortness of breath.  . Cetirizine HCl 10 MG CAPS Take 1 capsule (10 mg total) by mouth daily.  . ferrous sulfate 325 (65 FE) MG tablet Take 1 tablet (325 mg total) by mouth 2 (two) times daily with a meal.  . FLOVENT HFA 220 MCG/ACT inhaler   . fluticasone (FLONASE) 50 MCG/ACT nasal spray Place 2 sprays into both nostrils daily.  . Naltrexone-Bupropion HCl ER 8-90 MG TB12 Titrate as directed and then take 2 tablets by mouth twice daily.  Marland Kitchen Phenylephrine-Acetaminophen 5-325 MG TABS Take 2 tablets by mouth every 6 (six) hours as needed.  . Vitamin D, Ergocalciferol, (DRISDOL) 50000 units CAPS  capsule Take 1 capsule (50,000 Units total) by mouth every 7 (seven) days.  . [DISCONTINUED] famotidine (PEPCID) 40 MG tablet Take 1 tablet (40 mg total) by mouth every evening. (Patient not taking: Reported on 12/28/2015)   No current facility-administered medications on file prior to visit.     Review of Systems Per HPI unless specifically indicated above     Objective:    BP 121/65   Pulse 95   Temp 98.9 F (37.2 C) (Oral)   Resp 16   Ht _0  (1.702 m)   Wt 252 lb (114.3 kg)   LMP 07/14/2016 (Exact Date)   BMI 39.47 kg/m   Wt Readings from Last 3 Encounters:  08/08/16 252 lb (114.3 kg)  07/28/16 250 lb (113.4 kg)  07/15/16 255 lb (115.7 kg)    Physical Exam  Constitutional: She is oriented to person, place, and time. She appears well-developed and well-nourished.  HENT:  Head: Normocephalic and atraumatic.  Neck: Neck supple.  Cardiovascular: Normal rate, regular rhythm and normal heart sounds.  Exam reveals no gallop and no friction rub.   No murmur heard. Pulmonary/Chest: Effort normal and breath sounds normal. She has no wheezes. She exhibits no tenderness.  Abdominal: Soft. Normal appearance and bowel sounds are normal. She exhibits no distension and no mass.  There is no tenderness. There is no rebound and no guarding.  Musculoskeletal: Normal range of motion. She exhibits no edema or tenderness.  Lymphadenopathy:    She has no cervical adenopathy.  Neurological: She is alert and oriented to person, place, and time.  Skin: Skin is warm and dry.  Plantar warts on the plantar surface of left foot on the fat pad below the toes.    Results for orders placed or performed in visit on 07/15/16  CBC with Differential  Result Value Ref Range   WBC 3.4 (L) 3.8 - 10.8 K/uL   RBC 4.36 3.80 - 5.10 MIL/uL   Hemoglobin 11.2 (L) 11.7 - 15.5 g/dL   HCT 34.7 (L) 35.0 - 45.0 %   MCV 79.6 (L) 80.0 - 100.0 fL   MCH 25.7 (L) 27.0 - 33.0 pg   MCHC 32.3 32.0 - 36.0 g/dL   RDW 13.9  11.0 - 15.0 %   Platelets 231 140 - 400 K/uL   MPV 10.5 7.5 - 12.5 fL   Neutro Abs 1,768 1,500 - 7,800 cells/uL   Lymphs Abs 1,224 850 - 3,900 cells/uL   Monocytes Absolute 340 200 - 950 cells/uL   Eosinophils Absolute 68 15 - 500 cells/uL   Basophils Absolute 0 0 - 200 cells/uL   Neutrophils Relative % 52 %   Lymphocytes Relative 36 %   Monocytes Relative 10 %   Eosinophils Relative 2 %   Basophils Relative 0 %   Smear Review Criteria for review not met       Assessment & Plan:   Problem List Items Addressed This Visit      Other   BMI 40.0-44.9, adult ([place])    Lost of 6lbs since starting Contrave and pt is not up to full dose. Will continue at this time. Plan ot have patient increase slowly. Continue diet and lifestyle changes. Recheck 4 weeks at full dose to determine if medication is effective.       Iron deficiency anemia - Primary    Recheck CBC. Anemia appears to be from blood loss as opposed to true iron deficiency. Pt cancelled her GYN appt on 9/13 to discuss heavy periods. Recheck at next visit. If heavy periods continue- strongly encourage pt to see GYN. Consider hematology referral for continued anemia.       Relevant Orders   CBC with Differential    Other Visit Diagnoses    Need for influenza vaccination       Relevant Orders   Flu Vaccine QUAD 36+ mos PF IM (Fluarix & Fluzone Quad PF) (Completed)   Plantar wart       Cryotherapy both warts, recommend Dr. Felicie Morn wart remover at home. Consider podiatry if still present.       No orders of the defined types were placed in this encounter.     Follow up plan: Return in about 4 weeks (around 09/05/2016), or if symptoms worsen or fail to improve, for Weight check. Marland Kitchen

## 2016-08-08 NOTE — Assessment & Plan Note (Signed)
Recheck CBC. Anemia appears to be from blood loss as opposed to true iron deficiency. Pt cancelled her GYN appt on 9/13 to discuss heavy periods. Recheck at next visit. If heavy periods continue- strongly encourage pt to see GYN. Consider hematology referral for continued anemia.

## 2016-08-09 ENCOUNTER — Encounter: Payer: Self-pay | Admitting: Family Medicine

## 2016-08-11 ENCOUNTER — Encounter: Payer: Self-pay | Admitting: Family Medicine

## 2016-08-12 ENCOUNTER — Other Ambulatory Visit: Payer: Self-pay | Admitting: Family Medicine

## 2016-08-12 DIAGNOSIS — D509 Iron deficiency anemia, unspecified: Secondary | ICD-10-CM

## 2016-08-15 ENCOUNTER — Encounter: Payer: Self-pay | Admitting: Family Medicine

## 2016-08-15 ENCOUNTER — Encounter: Payer: Self-pay | Admitting: Hematology and Oncology

## 2016-08-18 ENCOUNTER — Encounter: Payer: Self-pay | Admitting: Family Medicine

## 2016-08-18 ENCOUNTER — Encounter: Payer: Self-pay | Admitting: Hematology and Oncology

## 2016-08-22 ENCOUNTER — Emergency Department
Admission: EM | Admit: 2016-08-22 | Discharge: 2016-08-22 | Disposition: A | Discharge status: Home / Self Care | Payer: BLUE CROSS/BLUE SHIELD | Attending: Student | Admitting: Student

## 2016-08-22 ENCOUNTER — Encounter: Payer: Self-pay | Admitting: Emergency Medicine

## 2016-08-22 DIAGNOSIS — B070 Plantar wart: Secondary | ICD-10-CM | POA: Diagnosis not present

## 2016-08-22 DIAGNOSIS — M79672 Pain in left foot: Secondary | ICD-10-CM | POA: Diagnosis present

## 2016-08-22 DIAGNOSIS — Z7951 Long term (current) use of inhaled steroids: Secondary | ICD-10-CM | POA: Diagnosis not present

## 2016-08-22 DIAGNOSIS — J45909 Unspecified asthma, uncomplicated: Secondary | ICD-10-CM | POA: Diagnosis not present

## 2016-08-22 NOTE — ED Triage Notes (Signed)
Pt with left foot pain. No injury.

## 2016-08-22 NOTE — ED Provider Notes (Signed)
[facility] Emergency Department Provider Note  ____________________________________________  Time seen: Approximately 10:16 AM  I have reviewed the triage vital signs and the nursing notes.   HISTORY  Chief Complaint Foot Pain    HPI Jasmine Gordon is a 30 y.o. female, NAD, presents to emergency room with one-month history of left foot pain. Pain began with a small sore on the ball of her foot but now she notes there are 3. She has seen her primary care provider who attempted cryotherapy for plantar warts but the patient notes that she did not feel cold when the procedure was done. Has not had any further procedures. Has been using over-the-counter wart removal treatments without any alleviation. States pain about the ball of the foot increases with weightbearing. Has not noted any redness, swelling, or abnormal warmth. No oozing or weeping.  Patient denies injury, trauma or fall affecting the left foot or ankle. Notes that the pain is specifically in the sole of the foot and does not radiate into the dorsal foot, ankle or lower leg. Has full range of motion of her left lower extremity.  Has had no fevers or chills.   Past Medical History:  Diagnosis Date  . Allergy   . Anemia   . Anxiety   . Asthma   . Bronchiolitis   . Depression   . Migraine   . Panic attack   . PTSD (post-traumatic stress disorder) 2016    Patient Active Problem List   Diagnosis Date Noted  . Microcytic anemia 07/15/2016  . Menorrhagia with regular cycle 06/27/2016  . Asthma 06/17/2016  . Anxiety and depression 06/17/2016  . BMI 40.0-44.9, adult ([facility]CC) 06/17/2016    Past Surgical History:  Procedure Laterality Date  . APPENDECTOMY      Prior to Admission medications   Medication Sig Start Date End Date Taking? Authorizing Provider  albuterol (PROVENTIL HFA;VENTOLIN HFA) 108 (90 Base) MCG/ACT inhaler Inhale 2 puffs into the lungs every 6 (six) hours as needed for  wheezing or shortness of breath. 06/17/16   Amy Rusty AusLauren Krebs, NP  Cetirizine HCl 10 MG CAPS Take 1 capsule (10 mg total) by mouth daily. 07/28/16   Chinita Pesterari B Triplett, FNP  ferrous sulfate 325 (65 FE) MG tablet Take 1 tablet (325 mg total) by mouth 2 (two) times daily with a meal. 06/27/16   Amy Rusty AusLauren Krebs, NP  FLOVENT HFA 220 MCG/ACT inhaler  06/22/16   Historical Provider, MD  fluticasone (FLONASE) 50 MCG/ACT nasal spray Place 2 sprays into both nostrils daily. 07/15/16   Amy Rusty AusLauren Krebs, NP  Naltrexone-Bupropion HCl ER 8-90 MG TB12 Titrate as directed and then take 2 tablets by mouth twice daily. 06/27/16   Amy Rusty AusLauren Krebs, NP  Phenylephrine-Acetaminophen 5-325 MG TABS Take 2 tablets by mouth every 6 (six) hours as needed. 07/15/16   Amy Rusty AusLauren Krebs, NP  Vitamin D, Ergocalciferol, (DRISDOL) 50000 units CAPS capsule Take 1 capsule (50,000 Units total) by mouth every 7 (seven) days. 06/22/16   Amy Rusty AusLauren Krebs, NP    Allergies Asa [aspirin]; Reglan [metoclopramide]; and Turmeric  Family History  Problem Relation Age of Onset  . Depression Mother   . Mental retardation Mother   . Bipolar disorder Mother   . Diabetes Father   . Stroke Maternal Grandfather     Social History Social History  Substance Use Topics  . Smoking status: Never Smoker  . Smokeless tobacco: Never Used  . Alcohol use No  Comment: occ     Review of Systems  Constitutional: No fever/chills Musculoskeletal: Positive pain sole of foot at site of skin sores. Negative for pain in left ankle or restriction in ROM.  Skin: Positive skin sores sole of left foot. Negative for rash, redness, oozing, weeping, bleeding.   ____________________________________________   PHYSICAL EXAM:  VITAL SIGNS: ED Triage Vitals  Enc Vitals Group     BP 08/22/16 0921 132/76     Pulse Rate 08/22/16 0921 96     Resp 08/22/16 0921 18     Temp 08/22/16 0921 97.6 F (36.4 C)     Temp Source 08/22/16 0921 Oral     SpO2 08/22/16 0921 98 %      Weight 08/22/16 0922 243 lb (110.2 kg)     Height 08/22/16 0922 5\' 7"  (1.702 m)     Head Circumference --      Peak Flow --      Pain Score 08/22/16 0922 8     Pain Loc --      Pain Edu? --      Excl. in GC? --      Constitutional: Alert and oriented. Well appearing and in no acute distress. Eyes: Conjunctivae are normal.   Head: Atraumatic. Cardiovascular: Good peripheral pulses with 2+ pulses in the left lower extremity. Capillary refill is brisk in all digits of the left foot Respiratory: Normal respiratory effort without tachypnea or retractions.  Musculoskeletal: Full range of motion of the left ankle, foot and toes without pain or difficulty. Neurologic:  Normal speech and language. No gross focal neurologic deficits are appreciated.  Skin:  3 x 3-51mm lesions on ball of left foot with skin thickening and central depression, mildly tender to palpation consistent with plantar warts. No redness, swelling, abnormal warmth, oozing, weeping. Psychiatric: Mood and affect are normal. Speech and behavior are normal. Patient exhibits appropriate insight and judgement.   ____________________________________________   LABS  None ____________________________________________  EKG  None ____________________________________________  RADIOLOGY   No results found.  ____________________________________________    PROCEDURES  Procedure(s) performed: None   Procedures   Medications - No data to display   ____________________________________________   INITIAL IMPRESSION / ASSESSMENT AND PLAN / ED COURSE  Pertinent labs & imaging results that were available during my care of the patient were reviewed by me and considered in my medical decision making (see chart for details).  Clinical Course    Patient's diagnosis is consistent with plantar warts. Patient Will be discharged with instructions to utilize wart pads over-the-counter to limit pressure on the lesion  centrally. Patient advised to follow-up with Dr. Orland Jarred in podiatry for further evaluation and treatment. Patient is given ED precautions to return to the ED for any worsening or new symptoms.    ____________________________________________  FINAL CLINICAL IMPRESSION(S) / ED DIAGNOSES  Final diagnoses:  Plantar wart of left foot      NEW MEDICATIONS STARTED DURING THIS VISIT:  New Prescriptions   No medications on file        Hope Pigeon, PA-C 08/22/16 1033    Gayla Doss, MD 08/22/16 [phone]

## 2016-08-22 NOTE — Discharge Instructions (Signed)
Please call the podiatry office today to schedule an appointment.

## 2016-08-25 ENCOUNTER — Encounter: Payer: Self-pay | Admitting: Emergency Medicine

## 2016-08-25 ENCOUNTER — Emergency Department
Admission: EM | Admit: 2016-08-25 | Discharge: 2016-08-25 | Disposition: A | Discharge status: Home / Self Care | Payer: BLUE CROSS/BLUE SHIELD | Attending: Emergency Medicine | Admitting: Emergency Medicine

## 2016-08-25 DIAGNOSIS — R05 Cough: Secondary | ICD-10-CM | POA: Diagnosis not present

## 2016-08-25 DIAGNOSIS — J45909 Unspecified asthma, uncomplicated: Secondary | ICD-10-CM | POA: Insufficient documentation

## 2016-08-25 DIAGNOSIS — E860 Dehydration: Secondary | ICD-10-CM | POA: Insufficient documentation

## 2016-08-25 DIAGNOSIS — Z79899 Other long term (current) drug therapy: Secondary | ICD-10-CM | POA: Insufficient documentation

## 2016-08-25 DIAGNOSIS — R059 Cough, unspecified: Secondary | ICD-10-CM

## 2016-08-25 DIAGNOSIS — R1030 Lower abdominal pain, unspecified: Secondary | ICD-10-CM

## 2016-08-25 LAB — URINALYSIS COMPLETE WITH MICROSCOPIC (ARMC ONLY)
Bacteria, UA: NONE SEEN
Bilirubin Urine: NEGATIVE
Glucose, UA: NEGATIVE mg/dL
Hgb urine dipstick: NEGATIVE
Nitrite: NEGATIVE
Protein, ur: 30 mg/dL — AB
Specific Gravity, Urine: 1.03 (ref 1.005–1.030)
pH: 5 (ref 5.0–8.0)

## 2016-08-25 LAB — LIPASE, BLOOD: Lipase: 22 U/L (ref 11–51)

## 2016-08-25 LAB — COMPREHENSIVE METABOLIC PANEL
ALT: 13 U/L — ABNORMAL LOW (ref 14–54)
AST: 19 U/L (ref 15–41)
Albumin: 4.3 g/dL (ref 3.5–5.0)
Alkaline Phosphatase: 45 U/L (ref 38–126)
Anion gap: 5 (ref 5–15)
BUN: 12 mg/dL (ref 6–20)
CO2: 26 mmol/L (ref 22–32)
Calcium: 9.4 mg/dL (ref 8.9–10.3)
Chloride: 106 mmol/L (ref 101–111)
Creatinine, Ser: 0.79 mg/dL (ref 0.44–1.00)
GFR calc Af Amer: >60 mL/min (ref >60)
GFR calc non Af Amer: >60 mL/min (ref >60)
Glucose, Bld: 109 mg/dL — ABNORMAL HIGH (ref 65–99)
Potassium: 3.5 mmol/L (ref 3.5–5.1)
Sodium: 137 mmol/L (ref 135–145)
Total Bilirubin: 0.5 mg/dL (ref 0.3–1.2)
Total Protein: 7.6 g/dL (ref 6.5–8.1)

## 2016-08-25 LAB — CBC
HCT: 34.1 % — ABNORMAL LOW (ref 35.0–47.0)
Hemoglobin: 11.6 g/dL — ABNORMAL LOW (ref 12.0–16.0)
MCH: 26.8 pg (ref 26.0–34.0)
MCHC: 33.9 g/dL (ref 32.0–36.0)
MCV: 79.1 fL — ABNORMAL LOW (ref 80.0–100.0)
Platelets: 219 10*3/uL (ref 150–440)
RBC: 4.31 MIL/uL (ref 3.80–5.20)
RDW: 14.1 % (ref 11.5–14.5)
WBC: 5.1 10*3/uL (ref 3.6–11.0)

## 2016-08-25 LAB — POCT PREGNANCY, URINE: Preg Test, Ur: NEGATIVE

## 2016-08-25 MED ORDER — SODIUM CHLORIDE 0.9 % IV BOLUS (SEPSIS)
1000.0000 mL | Freq: Once | INTRAVENOUS | Status: AC
  Administered 2016-08-25: 1000 mL via INTRAVENOUS

## 2016-08-25 NOTE — ED Triage Notes (Signed)
Pt presents to ED with reports of lower abdominal pain, nausea and malaise for two days. Pt denies vomiting, denies diarrhea. Pt denies dysuria. Pt states the pain is like menstrual cramping but she is not due for her period for two weeks.

## 2016-08-25 NOTE — ED Provider Notes (Signed)
[facility] Emergency Department Provider Note   ____________________________________________   First MD Initiated Contact with Patient 08/25/16 1944     (approximate)  I have reviewed the triage vital signs and the nursing notes.   HISTORY  Chief Complaint Abdominal Pain and Nausea   HPI Jasmine Gordon is a 30 y.o. female with a history of anxiety and depression who is presenting with lower abdominal cramping that began yesterday. She says that she walked 4 miles this past Tuesday and has been eating and drinking as normal. She denies any burning with urination. Denies any vaginal bleeding or discharge. Says that she has had nausea as well as a cough and says that her children are also ill right now a cough. Denies any fever. Says that she feels like she is dehydrated and felt similarly in the past when she became severely dehydrated and became lethargic. She is requesting IV normal saline so that she does not progress to her previous state of severe dehydration.   Past Medical History:  Diagnosis Date  . Allergy   . Anemia   . Anxiety   . Asthma   . Bronchiolitis   . Depression   . Migraine   . Panic attack   . PTSD (post-traumatic stress disorder) 2016    Patient Active Problem List   Diagnosis Date Noted  . Microcytic anemia 07/15/2016  . Menorrhagia with regular cycle 06/27/2016  . Asthma 06/17/2016  . Anxiety and depression 06/17/2016  . BMI 40.0-44.9, adult ([facility]) 06/17/2016    Past Surgical History:  Procedure Laterality Date  . APPENDECTOMY      Prior to Admission medications   Medication Sig Start Date End Date Taking? Authorizing Provider  albuterol (PROVENTIL HFA;VENTOLIN HFA) 108 (90 Base) MCG/ACT inhaler Inhale 2 puffs into the lungs every 6 (six) hours as needed for wheezing or shortness of breath. 06/17/16   Amy Rusty Aus, NP  Cetirizine HCl 10 MG CAPS Take 1 capsule (10 mg total) by mouth daily. 07/28/16   Chinita Pester, FNP  ferrous sulfate 325 (65 FE) MG tablet Take 1 tablet (325 mg total) by mouth 2 (two) times daily with a meal. 06/27/16   Amy Rusty Aus, NP  FLOVENT HFA 220 MCG/ACT inhaler  06/22/16   Historical Provider, MD  fluticasone (FLONASE) 50 MCG/ACT nasal spray Place 2 sprays into both nostrils daily. 07/15/16   Amy Rusty Aus, NP  Naltrexone-Bupropion HCl ER 8-90 MG TB12 Titrate as directed and then take 2 tablets by mouth twice daily. 06/27/16   Amy Rusty Aus, NP  Phenylephrine-Acetaminophen 5-325 MG TABS Take 2 tablets by mouth every 6 (six) hours as needed. 07/15/16   Amy Rusty Aus, NP  Vitamin D, Ergocalciferol, (DRISDOL) 50000 units CAPS capsule Take 1 capsule (50,000 Units total) by mouth every 7 (seven) days. 06/22/16   Amy Rusty Aus, NP    Allergies Asa [aspirin]; Reglan [metoclopramide]; and Turmeric  Family History  Problem Relation Age of Onset  . Depression Mother   . Mental retardation Mother   . Bipolar disorder Mother   . Diabetes Father   . Stroke Maternal Grandfather     Social History Social History  Substance Use Topics  . Smoking status: Never Smoker  . Smokeless tobacco: Never Used  . Alcohol use No     Comment: occ    Review of Systems Constitutional: No fever/chills Eyes: No visual changes. ENT: No sore throat. Cardiovascular: Denies chest pain. Respiratory: Denies shortness  of breath. Gastrointestinal:  no vomiting.  No diarrhea.  No constipation. Genitourinary: Negative for dysuria. Musculoskeletal: Negative for back pain. Skin: Negative for rash. Neurological: Negative for headaches, focal weakness or numbness.  10-point ROS otherwise negative.  ____________________________________________   PHYSICAL EXAM:  VITAL SIGNS: ED Triage Vitals  Enc Vitals Group     BP 08/25/16 1739 106/85     Pulse Rate 08/25/16 1739 (!) 115     Resp 08/25/16 1739 18     Temp 08/25/16 1739 98.5 F (36.9 C)     Temp Source 08/25/16 1739 Oral      SpO2 08/25/16 1739 100 %     Weight 08/25/16 1740 241 lb (109.3 kg)     Height 08/25/16 1740 5\' 7"  (1.702 m)     Head Circumference --      Peak Flow --      Pain Score 08/25/16 1740 7     Pain Loc --      Pain Edu? --      Excl. in GC? --     Constitutional: Alert and oriented. Well appearing and in no acute distress. Eyes: Conjunctivae are normal. PERRL. EOMI. Head: Atraumatic. Nose: No congestion/rhinnorhea. Mouth/Throat: Mucous membranes are moist.  Oropharynx non-erythematous. Neck: No stridor.   Cardiovascular: Normal rate, regular rhythm. Grossly normal heart sounds.   Respiratory: Normal respiratory effort.  No retractions. Lungs CTAB. Gastrointestinal: Soft and nontender. No distention. No CVA tenderness. Musculoskeletal: No lower extremity tenderness nor edema.  No joint effusions. Neurologic:  Normal speech and language. No gross focal neurologic deficits are appreciated. No gait instability. Skin:  Skin is warm, dry and intact. No rash noted. Psychiatric: Mood and affect are normal. Speech and behavior are normal.  ____________________________________________   LABS (all labs ordered are listed, but only abnormal results are displayed)  Labs Reviewed  COMPREHENSIVE METABOLIC PANEL - Abnormal; Notable for the following:       Result Value   Glucose, Bld 109 (*)    ALT 13 (*)    All other components within normal limits  CBC - Abnormal; Notable for the following:    Hemoglobin 11.6 (*)    HCT 34.1 (*)    MCV 79.1 (*)    All other components within normal limits  URINALYSIS COMPLETEWITH MICROSCOPIC ([facility] ONLY) - Abnormal; Notable for the following:    Color, Urine YELLOW (*)    APPearance CLEAR (*)    Ketones, ur 1+ (*)    Protein, ur 30 (*)    Leukocytes, UA TRACE (*)    Squamous Epithelial / LPF 0-5 (*)    All other components within normal limits  URINE CULTURE  LIPASE, BLOOD  POCT PREGNANCY, URINE    ____________________________________________  EKG   ____________________________________________  RADIOLOGY   ____________________________________________   PROCEDURES  Procedure(s) performed:   Procedures  Critical Care performed:   ____________________________________________   INITIAL IMPRESSION / ASSESSMENT AND PLAN / ED COURSE  Pertinent labs & imaging results that were available during my care of the patient were reviewed by me and considered in my medical decision making (see chart for details).  ----------------------------------------- 10:27 PM on 08/25/2016 -----------------------------------------  Patient feeling improved after fluids. Will be discharged to home. Possible viral illness with secondary dehydration. Labs and exam are reassuring. I do not suspect any surgical abdominal pathology at this time. Patient understands plan and is willing to comply.  Clinical Course     ____________________________________________   FINAL CLINICAL IMPRESSION(S) / ED DIAGNOSES  Cough. Abdominal pain. Dehydration.    NEW MEDICATIONS STARTED DURING THIS VISIT:  New Prescriptions   No medications on file     Note:  This document was prepared using Dragon voice recognition software and may include unintentional dictation errors.    Myrna Blazeravid Matthew Shawneen Deetz, MD 08/25/16 2227

## 2016-08-26 ENCOUNTER — Ambulatory Visit (INDEPENDENT_AMBULATORY_CARE_PROVIDER_SITE_OTHER): Payer: BLUE CROSS/BLUE SHIELD | Admitting: Family Medicine

## 2016-08-26 ENCOUNTER — Encounter: Payer: Self-pay | Admitting: Family Medicine

## 2016-08-26 VITALS — BP 116/67 | HR 97 | Temp 98.6°F | Resp 16 | Ht 67.0 in | Wt 243.0 lb

## 2016-08-26 DIAGNOSIS — Z6838 Body mass index (BMI) 38.0-38.9, adult: Secondary | ICD-10-CM

## 2016-08-26 DIAGNOSIS — E6609 Other obesity due to excess calories: Secondary | ICD-10-CM | POA: Diagnosis not present

## 2016-08-26 DIAGNOSIS — D509 Iron deficiency anemia, unspecified: Secondary | ICD-10-CM

## 2016-08-26 DIAGNOSIS — B070 Plantar wart: Secondary | ICD-10-CM

## 2016-08-26 MED ORDER — NALTREXONE-BUPROPION HCL ER 8-90 MG PO TB12: ORAL_TABLET | ORAL | 1 refills | Status: DC

## 2016-08-26 NOTE — Assessment & Plan Note (Signed)
Continue Contrave until December at this time. Encouraged continued diet and lifestyle changes. Encouraged pt to continue with plant based diet but recommend protein with each meal. Drink only water. Goal is 1800 cal per day.  Recheck 2 mos.

## 2016-08-26 NOTE — Assessment & Plan Note (Signed)
Mild improvement in CBC based on ER labs yesterday. Pt has admitted to not taking iron regularly due to side effect. Could be source of slow improvement. She may benefit from iron infusions if she can't tolerate PO iron.  Appt with hematology 10/17

## 2016-08-26 NOTE — Progress Notes (Signed)
Subjective:    Patient ID: Jasmine Gordon, female    DOB: January 03, 1986, 30 y.o.   MRN: [id number]  HPI: Jasmine Gordon is a 30 y.o. female presenting on 08/26/2016 for Weight Check   HPI  Pt presents for follow-up of weight loss medications.  She is taking full dose of contrave at this time. Loss of 9lbs since last visit. Total loss of 15 lbs since starting medication. Goal weight is about 200lbs. She has been taking full dose for about 1 mos at this time. Diet and lifestyle changes- walking 2 miles per day. Eating lots of fruits and vegetables. Eats mainly chicken. Avoids fried food and pizza. Starting BMI was 40.5 today she is at a BMI of 38.06 Of note she has been to the ER twice this week. She was seen for plantar wart pain- they set her up with podiatry at that time. She will see them on Wednesday.Had walked 4 miles on Tuesday with her neighbor. Was nauseated yesterday.  No vomiting. She requested IV fluids for mild dehydration.  Feeling much better.  Will see hematology on 10/17 for hematology. Taking oral iron- but sometimes she skips. The ER checked a CBC it she has shown mild improvement in hemoglobin.    Past Medical History:  Diagnosis Date  . Allergy   . Anemia   . Anxiety   . Asthma   . Bronchiolitis   . Depression   . Migraine   . Panic attack   . PTSD (post-traumatic stress disorder) 2016    Current Outpatient Prescriptions on File Prior to Visit  Medication Sig  . albuterol (PROVENTIL HFA;VENTOLIN HFA) 108 (90 Base) MCG/ACT inhaler Inhale 2 puffs into the lungs every 6 (six) hours as needed for wheezing or shortness of breath.  . Cetirizine HCl 10 MG CAPS Take 1 capsule (10 mg total) by mouth daily.  . ferrous sulfate 325 (65 FE) MG tablet Take 1 tablet (325 mg total) by mouth 2 (two) times daily with a meal.  . FLOVENT HFA 220 MCG/ACT inhaler   . fluticasone (FLONASE) 50 MCG/ACT nasal spray Place 2 sprays into both nostrils daily.  . Vitamin D,  Ergocalciferol, (DRISDOL) 50000 units CAPS capsule Take 1 capsule (50,000 Units total) by mouth every 7 (seven) days.  . [DISCONTINUED] famotidine (PEPCID) 40 MG tablet Take 1 tablet (40 mg total) by mouth every evening. (Patient not taking: Reported on 12/28/2015)   No current facility-administered medications on file prior to visit.     Review of Systems  Constitutional: Negative for chills and fever.  HENT: Negative.   Respiratory: Negative for cough, chest tightness and wheezing.   Cardiovascular: Negative for chest pain and leg swelling.  Gastrointestinal: Negative for abdominal pain, constipation, diarrhea, nausea and vomiting.  Endocrine: Negative.  Negative for cold intolerance, heat intolerance, polydipsia, polyphagia and polyuria.  Genitourinary: Negative for difficulty urinating and dysuria.  Musculoskeletal: Negative.   Skin:       Plantar warts toe.  Neurological: Negative for dizziness, light-headedness and numbness.  Psychiatric/Behavioral: Negative.    Per HPI unless specifically indicated above     Objective:    BP 116/67   Pulse 97   Temp 98.6 F (37 C) (Oral)   Resp 16   Ht 5\' 7"  (1.702 m)   Wt 243 lb (110.2 kg)   LMP 08/14/2016   BMI 38.06 kg/m   Wt Readings from Last 3 Encounters:  08/26/16 243 lb (110.2 kg)  08/25/16 241 lb (109.3  kg)  08/22/16 243 lb (110.2 kg)    Physical Exam  Constitutional: She is oriented to person, place, and time. She appears well-developed and well-nourished.  HENT:  Head: Normocephalic and atraumatic.  Neck: Neck supple.  Cardiovascular: Normal rate, regular rhythm and normal heart sounds.  Exam reveals no gallop and no friction rub.   No murmur heard. Pulmonary/Chest: Effort normal and breath sounds normal. She has no wheezes. She exhibits no tenderness.  Abdominal: Soft. Normal appearance and bowel sounds are normal. She exhibits no distension and no mass. There is no tenderness. There is no rebound and no guarding.    Musculoskeletal: Normal range of motion. She exhibits no edema or tenderness.  Lymphadenopathy:    She has no cervical adenopathy.  Neurological: She is alert and oriented to person, place, and time.  Skin: Skin is warm and dry.  3 wart on the plantar surface of the L foot.    Results for orders placed or performed during the hospital encounter of 08/25/16  Lipase, blood  Result Value Ref Range   Lipase 22 11 - 51 U/L  Comprehensive metabolic panel  Result Value Ref Range   Sodium 137 135 - 145 mmol/L   Potassium 3.5 3.5 - 5.1 mmol/L   Chloride 106 101 - 111 mmol/L   CO2 26 22 - 32 mmol/L   Glucose, Bld 109 (H) 65 - 99 mg/dL   BUN 12 6 - 20 mg/dL   Creatinine, Ser 1.610.79 0.44 - 1.00 mg/dL   Calcium 9.4 8.9 - 09.610.3 mg/dL   Total Protein 7.6 6.5 - 8.1 g/dL   Albumin 4.3 3.5 - 5.0 g/dL   AST 19 15 - 41 U/L   ALT 13 (L) 14 - 54 U/L   Alkaline Phosphatase 45 38 - 126 U/L   Total Bilirubin 0.5 0.3 - 1.2 mg/dL   GFR calc non Af Amer >60 >60 mL/min   GFR calc Af Amer >60 >60 mL/min   Anion gap 5 5 - 15  CBC  Result Value Ref Range   WBC 5.1 3.6 - 11.0 K/uL   RBC 4.31 3.80 - 5.20 MIL/uL   Hemoglobin 11.6 (L) 12.0 - 16.0 g/dL   HCT 04.534.1 (L) 40.935.0 - 81.147.0 %   MCV 79.1 (L) 80.0 - 100.0 fL   MCH 26.8 26.0 - 34.0 pg   MCHC 33.9 32.0 - 36.0 g/dL   RDW 91.414.1 78.211.5 - 95.614.5 %   Platelets 219 150 - 440 K/uL  Urinalysis complete, with microscopic  Result Value Ref Range   Color, Urine YELLOW (A) YELLOW   APPearance CLEAR (A) CLEAR   Glucose, UA NEGATIVE NEGATIVE mg/dL   Bilirubin Urine NEGATIVE NEGATIVE   Ketones, ur 1+ (A) NEGATIVE mg/dL   Specific Gravity, Urine 1.030 1.005 - 1.030   Hgb urine dipstick NEGATIVE NEGATIVE   pH 5.0 5.0 - 8.0   Protein, ur 30 (A) NEGATIVE mg/dL   Nitrite NEGATIVE NEGATIVE   Leukocytes, UA TRACE (A) NEGATIVE   RBC / HPF 0-5 0 - 5 RBC/hpf   WBC, UA 0-5 0 - 5 WBC/hpf   Bacteria, UA NONE SEEN NONE SEEN   Squamous Epithelial / LPF 0-5 (A) NONE SEEN   Mucous  PRESENT   Pregnancy, urine POC  Result Value Ref Range   Preg Test, Ur NEGATIVE NEGATIVE      Assessment & Plan:   Problem List Items Addressed This Visit      Other   BMI 38.0-38.9,adult -  Primary    Continue Contrave until December at this time. Encouraged continued diet and lifestyle changes. Encouraged pt to continue with plant based diet but recommend protein with each meal. Drink only water. Goal is 1800 cal per day.  Recheck 2 mos.       Relevant Medications   Naltrexone-Bupropion HCl ER 8-90 MG TB12   Microcytic anemia    Mild improvement in CBC based on ER labs yesterday. Pt has admitted to not taking iron regularly due to side effect. Could be source of slow improvement. She may benefit from iron infusions if she can't tolerate PO iron.  Appt with hematology 10/17       Other Visit Diagnoses    Plantar warts       Continue OTC treatment. Pt has an appt with Podiatry next Wednesday.       Meds ordered this encounter  Medications  . Naltrexone-Bupropion HCl ER 8-90 MG TB12    Sig: Titrate as directed and then take 2 tablets by mouth twice daily.    Dispense:  120 tablet    Refill:  1    Order Specific Question:   Supervising Provider    Answer:   Janeann Forehand [[id number]]      Follow up plan: Return in about 2 months (around 10/26/2016), or if symptoms worsen or fail to improve, for Weight check. Marland Kitchen

## 2016-08-26 NOTE — Patient Instructions (Signed)
Keep up the good work with your weight loss. Continue to eat mainly fruits and vegetables. Keep walking daily.    Please keep appt with podiatry to help with plantar warts.

## 2016-08-27 LAB — URINE CULTURE: Culture: 30000 — AB

## 2016-09-06 ENCOUNTER — Inpatient Hospital Stay: Payer: BLUE CROSS/BLUE SHIELD | Admitting: Hematology and Oncology

## 2016-09-06 NOTE — Progress Notes (Deleted)
[facility] day:  09/06/2016  Chief Complaint: Jasmine Gordon is a 30 y.o. female with microcytic anemia who is referred in consultation by Fredia Sorrow, NP for assessment and management.  HPI: ***  CBC on 04/02/2016 included a hematocrit 39.2, 1112.7, MCV 81.1, platelets 222,000, white count 4300.  CBC on 06/13/2016 included a hematocrit 36.2, hemoglobin 11.5, MCV 81.1, platelets 210,000 and white count 4100.   CBC on 06/17/2016 included a hematocrit 34.7, hemoglobin 11.3, MCV 79, platelets 257,000, white count 4300 with an Kuna of 2021.  Creatinine was 0.73.  Ferritin was 26. Iron saturation was 16% with a TIBC of 272. B12 was 435.  TSH was 1.51.  CBC on 08/25/2016 included a hematocrit 34.1, hemoglobin 11.6, MCV 79.1, platelets 219,000 and white count 5100.  Urinalysis revealed 0-5 red blood cells and no hemoglobin.   Past Medical History:  Diagnosis Date  . Allergy   . Anemia   . Anxiety   . Asthma   . Bronchiolitis   . Depression   . Migraine   . Panic attack   . PTSD (post-traumatic stress disorder) 2016    Past Surgical History:  Procedure Laterality Date  . APPENDECTOMY      Family History  Problem Relation Age of Onset  . Depression Mother   . Mental retardation Mother   . Bipolar disorder Mother   . Diabetes Father   . Stroke Maternal Grandfather     Social History:  reports that she has never smoked. She has never used smokeless tobacco. She reports that she does not drink alcohol or use drugs.  The patient is accompanied by *** alone today.  Allergies:  Allergies  Allergen Reactions  . Asa [Aspirin] Hives and Shortness Of Breath  . Reglan [Metoclopramide] Hives and Shortness Of Breath  . Turmeric     Current Medications: Current Outpatient Prescriptions  Medication Sig Dispense Refill  . albuterol (PROVENTIL HFA;VENTOLIN HFA) 108 (90 Base) MCG/ACT inhaler Inhale 2 puffs into the lungs every 6 (six) hours  as needed for wheezing or shortness of breath. 1 Inhaler 11  . Cetirizine HCl 10 MG CAPS Take 1 capsule (10 mg total) by mouth daily. 30 capsule 3  . ferrous sulfate 325 (65 FE) MG tablet Take 1 tablet (325 mg total) by mouth 2 (two) times daily with a meal. 60 tablet 3  . FLOVENT HFA 220 MCG/ACT inhaler   11  . fluticasone (FLONASE) 50 MCG/ACT nasal spray Place 2 sprays into both nostrils daily. 16 g 11  . Naltrexone-Bupropion HCl ER 8-90 MG TB12 Titrate as directed and then take 2 tablets by mouth twice daily. 120 tablet 1  . Vitamin D, Ergocalciferol, (DRISDOL) 50000 units CAPS capsule Take 1 capsule (50,000 Units total) by mouth every 7 (seven) days. 12 capsule 1   No current facility-administered medications for this visit.     Review of Systems:  GENERAL:  Feels good.  Active.  No fevers, sweats or weight loss. PERFORMANCE STATUS (ECOG):  *** HEENT:  No visual changes, runny nose, sore throat, mouth sores or tenderness. Lungs: No shortness of breath or cough.  No hemoptysis. Cardiac:  No chest pain, palpitations, orthopnea, or PND. GI:  No nausea, vomiting, diarrhea, constipation, melena or hematochezia. GU:  No urgency, frequency, dysuria, or hematuria. Musculoskeletal:  No back pain.  No joint pain.  No muscle tenderness. Extremities:  No pain or swelling. Skin:  No rashes or skin changes. Neuro:  No headache, numbness or weakness, balance or coordination issues. Endocrine:  No diabetes, thyroid issues, hot flashes or night sweats. Psych:  No mood changes, depression or anxiety. Pain:  No focal pain. Review of systems:  All other systems reviewed and found to be negative.   Physical Exam: Last menstrual period 08/14/2016. GENERAL:  Well developed, well nourished, sitting comfortably in the exam room in no acute distress. MENTAL STATUS:  Alert and oriented to person, place and time. HEAD:  *** hair.  Normocephalic, atraumatic, face symmetric, no Cushingoid features. EYES:   *** eyes.  Pupils equal round and reactive to light and accomodation.  No conjunctivitis or scleral icterus. ENT:  Oropharynx clear without lesion.  Tongue normal. Mucous membranes moist.  RESPIRATORY:  Clear to auscultation without rales, wheezes or rhonchi. CARDIOVASCULAR:  Regular rate and rhythm without murmur, rub or gallop. ABDOMEN:  Soft, non-tender, with active bowel sounds, and no hepatosplenomegaly.  No masses. SKIN:  No rashes, ulcers or lesions. EXTREMITIES: No edema, no skin discoloration or tenderness.  No palpable cords. LYMPH NODES: No palpable cervical, supraclavicular, axillary or inguinal adenopathy  NEUROLOGICAL: Unremarkable. PSYCH:  Appropriate.  No visits with results within 3 Day(s) from this visit.  Latest known visit with results is:  Admission on 08/25/2016, Discharged on 08/25/2016  Component Date Value Ref Range Status  . Lipase 08/25/2016 22  11 - 51 U/L Final  . Sodium 08/25/2016 137  135 - 145 mmol/L Final  . Potassium 08/25/2016 3.5  3.5 - 5.1 mmol/L Final  . Chloride 08/25/2016 106  101 - 111 mmol/L Final  . CO2 08/25/2016 26  22 - 32 mmol/L Final  . Glucose, Bld 08/25/2016 109* 65 - 99 mg/dL Final  . BUN 08/25/2016 12  6 - 20 mg/dL Final  . Creatinine, Ser 08/25/2016 0.79  0.44 - 1.00 mg/dL Final  . Calcium 08/25/2016 9.4  8.9 - 10.3 mg/dL Final  . Total Protein 08/25/2016 7.6  6.5 - 8.1 g/dL Final  . Albumin 08/25/2016 4.3  3.5 - 5.0 g/dL Final  . AST 08/25/2016 19  15 - 41 U/L Final  . ALT 08/25/2016 13* 14 - 54 U/L Final  . Alkaline Phosphatase 08/25/2016 45  38 - 126 U/L Final  . Total Bilirubin 08/25/2016 0.5  0.3 - 1.2 mg/dL Final  . GFR calc non Af Amer 08/25/2016 >60  >60 mL/min Final  . GFR calc Af Amer 08/25/2016 >60  >60 mL/min Final   Comment: (NOTE) The eGFR has been calculated using the CKD EPI equation. This calculation has not been validated in all clinical situations. eGFR's persistently <60 mL/min signify possible Chronic  Kidney Disease.   . Anion gap 08/25/2016 5  5 - 15 Final  . WBC 08/25/2016 5.1  3.6 - 11.0 K/uL Final  . RBC 08/25/2016 4.31  3.80 - 5.20 MIL/uL Final  . Hemoglobin 08/25/2016 11.6* 12.0 - 16.0 g/dL Final  . HCT 08/25/2016 34.1* 35.0 - 47.0 % Final  . MCV 08/25/2016 79.1* 80.0 - 100.0 fL Final  . MCH 08/25/2016 26.8  26.0 - 34.0 pg Final  . MCHC 08/25/2016 33.9  32.0 - 36.0 g/dL Final  . RDW 08/25/2016 14.1  11.5 - 14.5 % Final  . Platelets 08/25/2016 219  150 - 440 K/uL Final  . Color, Urine 08/25/2016 YELLOW* YELLOW Final  . APPearance 08/25/2016 CLEAR* CLEAR Final  . Glucose, UA 08/25/2016 NEGATIVE  NEGATIVE mg/dL Final  . Bilirubin Urine 08/25/2016 NEGATIVE  NEGATIVE Final  .  Ketones, ur 08/25/2016 1+* NEGATIVE mg/dL Final  . Specific Gravity, Urine 08/25/2016 1.030  1.005 - 1.030 Final  . Hgb urine dipstick 08/25/2016 NEGATIVE  NEGATIVE Final  . pH 08/25/2016 5.0  5.0 - 8.0 Final  . Protein, ur 08/25/2016 30* NEGATIVE mg/dL Final  . Nitrite 08/25/2016 NEGATIVE  NEGATIVE Final  . Leukocytes, UA 08/25/2016 TRACE* NEGATIVE Final  . RBC / HPF 08/25/2016 0-5  0 - 5 RBC/hpf Final  . WBC, UA 08/25/2016 0-5  0 - 5 WBC/hpf Final  . Bacteria, UA 08/25/2016 NONE SEEN  NONE SEEN Final  . Squamous Epithelial / LPF 08/25/2016 0-5* NONE SEEN Final  . Mucous 08/25/2016 PRESENT   Final  . Preg Test, Ur 08/25/2016 NEGATIVE  NEGATIVE Final   Comment:        THE SENSITIVITY OF THIS METHODOLOGY IS >24 mIU/mL   . Specimen Description 08/27/2016 URINE, RANDOM   Final  . Special Requests 08/27/2016 NONE   Final  . Culture 08/27/2016 *  Final                   Value:30,000 COLONIES/mL LACTOBACILLUS SPECIES Standardized susceptibility testing for this organism is not available. Performed at [facility]   . Report Status 08/27/2016 08/27/2016 FINAL   Final    Assessment:  Jasmine Gordon is a 30 y.o. female ***  Plan: 1. *** 2. *** 3. *** 4. *** 5. ***  Lequita Asal,  MD  09/06/2016, 5:38 AM

## 2016-09-12 ENCOUNTER — Emergency Department
Admission: EM | Admit: 2016-09-12 | Discharge: 2016-09-12 | Disposition: A | Discharge status: Home / Self Care | Payer: BLUE CROSS/BLUE SHIELD | Attending: Emergency Medicine | Admitting: Emergency Medicine

## 2016-09-12 ENCOUNTER — Encounter: Payer: Self-pay | Admitting: Emergency Medicine

## 2016-09-12 ENCOUNTER — Emergency Department: Payer: BLUE CROSS/BLUE SHIELD

## 2016-09-12 DIAGNOSIS — Z79899 Other long term (current) drug therapy: Secondary | ICD-10-CM | POA: Diagnosis not present

## 2016-09-12 DIAGNOSIS — R002 Palpitations: Secondary | ICD-10-CM

## 2016-09-12 DIAGNOSIS — J45909 Unspecified asthma, uncomplicated: Secondary | ICD-10-CM | POA: Insufficient documentation

## 2016-09-12 LAB — CBC
HCT: 35 % (ref 35.0–47.0)
Hemoglobin: 11.5 g/dL — ABNORMAL LOW (ref 12.0–16.0)
MCH: 26.4 pg (ref 26.0–34.0)
MCHC: 32.9 g/dL (ref 32.0–36.0)
MCV: 80.4 fL (ref 80.0–100.0)
Platelets: 198 10*3/uL (ref 150–440)
RBC: 4.36 MIL/uL (ref 3.80–5.20)
RDW: 14.6 % — ABNORMAL HIGH (ref 11.5–14.5)
WBC: 3.7 10*3/uL (ref 3.6–11.0)

## 2016-09-12 LAB — BASIC METABOLIC PANEL
Anion gap: 7 (ref 5–15)
BUN: 12 mg/dL (ref 6–20)
CO2: 22 mmol/L (ref 22–32)
Calcium: 9.2 mg/dL (ref 8.9–10.3)
Chloride: 106 mmol/L (ref 101–111)
Creatinine, Ser: 0.81 mg/dL (ref 0.44–1.00)
GFR calc Af Amer: >60 mL/min (ref >60)
GFR calc non Af Amer: >60 mL/min (ref >60)
Glucose, Bld: 94 mg/dL (ref 65–99)
Potassium: 3.6 mmol/L (ref 3.5–5.1)
Sodium: 135 mmol/L (ref 135–145)

## 2016-09-12 LAB — POCT PREGNANCY, URINE: Preg Test, Ur: NEGATIVE

## 2016-09-12 LAB — TROPONIN I: Troponin I: <0.03 ng/mL (ref <0.03)

## 2016-09-12 NOTE — ED Provider Notes (Signed)
[facility] Emergency Department Provider Note  Time seen: 9:55 AM  I have reviewed the triage vital signs and the nursing notes.   HISTORY  Chief Complaint Palpitations    HPI Kyleena Thana AtesDavis Farmer is a 30 y.o. female with a past medical history of asthma, anxiety, migraines, depression, who presents to the emergency department for palpitations. According to the patient for the past 2 days she has been having intermittent palpitations which she describes as a heart beating fast and irregularly in her chest. States the episodes are very brief, but a been happening more frequently today. Patient denies any pain, nausea or diaphoresis. Denies any symptoms currently.    Past Medical History:  Diagnosis Date  . Allergy   . Anemia   . Anxiety   . Asthma   . Bronchiolitis   . Depression   . Migraine   . Panic attack   . PTSD (post-traumatic stress disorder) 2016    Patient Active Problem List   Diagnosis Date Noted  . Microcytic anemia 07/15/2016  . Menorrhagia with regular cycle 06/27/2016  . Asthma 06/17/2016  . Anxiety and depression 06/17/2016  . BMI 38.0-38.9,adult 06/17/2016    Past Surgical History:  Procedure Laterality Date  . APPENDECTOMY      Prior to Admission medications   Medication Sig Start Date End Date Taking? Authorizing Provider  albuterol (PROVENTIL HFA;VENTOLIN HFA) 108 (90 Base) MCG/ACT inhaler Inhale 2 puffs into the lungs every 6 (six) hours as needed for wheezing or shortness of breath. 06/17/16   Amy Rusty AusLauren Krebs, NP  Cetirizine HCl 10 MG CAPS Take 1 capsule (10 mg total) by mouth daily. 07/28/16   Chinita Pesterari B Triplett, FNP  ferrous sulfate 325 (65 FE) MG tablet Take 1 tablet (325 mg total) by mouth 2 (two) times daily with a meal. 06/27/16   Amy Rusty AusLauren Krebs, NP  FLOVENT HFA 220 MCG/ACT inhaler  06/22/16   Historical Provider, MD  fluticasone (FLONASE) 50 MCG/ACT nasal spray Place 2 sprays into both nostrils daily. 07/15/16   Amy Rusty AusLauren  Krebs, NP  Naltrexone-Bupropion HCl ER 8-90 MG TB12 Titrate as directed and then take 2 tablets by mouth twice daily. 08/26/16   Amy Rusty AusLauren Krebs, NP  Vitamin D, Ergocalciferol, (DRISDOL) 50000 units CAPS capsule Take 1 capsule (50,000 Units total) by mouth every 7 (seven) days. 06/22/16   Amy Rusty AusLauren Krebs, NP    Allergies  Allergen Reactions  . Asa [Aspirin] Hives and Shortness Of Breath  . Reglan [Metoclopramide] Hives and Shortness Of Breath  . Turmeric     Family History  Problem Relation Age of Onset  . Depression Mother   . Mental retardation Mother   . Bipolar disorder Mother   . Diabetes Father   . Stroke Maternal Grandfather     Social History Social History  Substance Use Topics  . Smoking status: Never Smoker  . Smokeless tobacco: Never Used  . Alcohol use No     Comment: occ    Review of Systems Constitutional: Negative for fever. Cardiovascular: Negative for chest pain.Positive for palpitations.  Respiratory: Negative for shortness of breath. Gastrointestinal: Negative for abdominal pain Neurological: Negative for headache 10-point ROS otherwise negative.  ____________________________________________   PHYSICAL EXAM:  VITAL SIGNS: ED Triage Vitals  Enc Vitals Group     BP 09/12/16 0857 112/83     Pulse Rate 09/12/16 0857 100     Resp 09/12/16 0857 16     Temp 09/12/16 0857 98.2 F (  36.8 C)     Temp Source 09/12/16 0857 Oral     SpO2 09/12/16 0857 100 %     Weight 09/12/16 0857 240 lb (108.9 kg)     Height 09/12/16 0857 5\' 7"  (1.702 m)     Head Circumference --      Peak Flow --      Pain Score 09/12/16 0858 0     Pain Loc --      Pain Edu? --      Excl. in GC? --     Constitutional: Alert and oriented. Well appearing and in no distress. Eyes: Normal exam ENT   Head: Normocephalic and atraumatic.   Mouth/Throat: Mucous membranes are moist. Cardiovascular: Normal rate, regular rhythm. No murmur Respiratory: Normal respiratory effort  without tachypnea nor retractions. Breath sounds are clear  Gastrointestinal: Soft and nontender. No distention.  Musculoskeletal: Nontender with normal range of motion in all extremities.  Neurologic:  Normal speech and language. No gross focal neurologic deficits  Skin:  Skin is warm, dry and intact.  Psychiatric: Mood and affect are normal.  ____________________________________________    EKG  EKG reviewed and interpreted by myself shows sinus tachycardia 101 bpm, narrow QRS, normal axis, normal intervals, nonspecific but no concerning ST changes.  ____________________________________________    RADIOLOGY  Chest x-ray negative  ____________________________________________   INITIAL IMPRESSION / ASSESSMENT AND PLAN / ED COURSE  Pertinent labs & imaging results that were available during my care of the patient were reviewed by me and considered in my medical decision making (see chart for details).  The patient presents to the emergency department for palpitations over the past 2 days. Patient does not drink alcohol. She does state very limited sleep over the last few days due to work. Denies caffeine use, but does state she takes a medication called contrave for weight loss. I reviewed the medication adverse effects which state 2% get palpitations. This could possibly be contributing to today's presentation. Currently the patient appears well, denies any symptoms at this time. Labs are largely within normal limits including metabolic panel and troponin. CBC clotted, we will resend. We will also check a by mouth see urine pregnancy test. If negative patient will be discharged home with PCP follow-up.   Labs are normal. Patient will be discharged with PCP follow-up. ____________________________________________   FINAL CLINICAL IMPRESSION(S) / ED DIAGNOSES  Palpitations    Minna Antis, MD 09/12/16 1233

## 2016-09-12 NOTE — ED Triage Notes (Signed)
C/O intermittent heart palpitations x 2 days.

## 2016-09-19 ENCOUNTER — Inpatient Hospital Stay: Payer: BLUE CROSS/BLUE SHIELD | Admitting: Hematology and Oncology

## 2016-09-19 NOTE — Progress Notes (Deleted)
[facility] day:  09/19/2016  Chief Complaint: Jasmine Gordon is a 30 y.o. female with microcytic anemia who is referred in consultation by Fredia Sorrow, NP for assessment and management.  HPI: ***  CBC on 04/02/2016 included a hematocrit 39.2, 1112.7, MCV 81.1, platelets 222,000, white count 4300.  CBC on 06/13/2016 included a hematocrit 36.2, hemoglobin 11.5, MCV 81.1, platelets 210,000 and white count 4100.   CBC on 06/17/2016 included a hematocrit 34.7, hemoglobin 11.3, MCV 79, platelets 257,000, white count 4300 with an [place] of 2021.  Creatinine was 0.73.  Ferritin was 26. Iron saturation was 16% with a TIBC of 272. B12 was 435.  TSH was 1.51.  CBC on 08/25/2016 included a hematocrit 34.1, hemoglobin 11.6, MCV 79.1, platelets 219,000 and white count 5100.  Urinalysis revealed 0-5 red blood cells and no hemoglobin.   Past Medical History:  Diagnosis Date  . Allergy   . Anemia   . Anxiety   . Asthma   . Bronchiolitis   . Depression   . Migraine   . Panic attack   . PTSD (post-traumatic stress disorder) 2016    Past Surgical History:  Procedure Laterality Date  . APPENDECTOMY      Family History  Problem Relation Age of Onset  . Depression Mother   . Mental retardation Mother   . Bipolar disorder Mother   . Diabetes Father   . Stroke Maternal Grandfather     Social History:  reports that she has never smoked. She has never used smokeless tobacco. She reports that she does not drink alcohol or use drugs.  The patient is accompanied by *** alone today.  Allergies:  Allergies  Allergen Reactions  . Asa [Aspirin] Hives and Shortness Of Breath  . Reglan [Metoclopramide] Hives and Shortness Of Breath  . Turmeric     Current Medications: Current Outpatient Prescriptions  Medication Sig Dispense Refill  . albuterol (PROVENTIL HFA;VENTOLIN HFA) 108 (90 Base) MCG/ACT inhaler Inhale 2 puffs into the lungs every 6 (six) hours  as needed for wheezing or shortness of breath. 1 Inhaler 11  . Cetirizine HCl 10 MG CAPS Take 1 capsule (10 mg total) by mouth daily. 30 capsule 3  . ferrous sulfate 325 (65 FE) MG tablet Take 1 tablet (325 mg total) by mouth 2 (two) times daily with a meal. 60 tablet 3  . FLOVENT HFA 220 MCG/ACT inhaler   11  . fluticasone (FLONASE) 50 MCG/ACT nasal spray Place 2 sprays into both nostrils daily. 16 g 11  . Naltrexone-Bupropion HCl ER 8-90 MG TB12 Titrate as directed and then take 2 tablets by mouth twice daily. 120 tablet 1  . Vitamin D, Ergocalciferol, (DRISDOL) 50000 units CAPS capsule Take 1 capsule (50,000 Units total) by mouth every 7 (seven) days. 12 capsule 1   No current facility-administered medications for this visit.     Review of Systems:  GENERAL:  Feels good.  Active.  No fevers, sweats or weight loss. PERFORMANCE STATUS (ECOG):  *** HEENT:  No visual changes, runny nose, sore throat, mouth sores or tenderness. Lungs: No shortness of breath or cough.  No hemoptysis. Cardiac:  No chest pain, palpitations, orthopnea, or PND. GI:  No nausea, vomiting, diarrhea, constipation, melena or hematochezia. GU:  No urgency, frequency, dysuria, or hematuria. Musculoskeletal:  No back pain.  No joint pain.  No muscle tenderness. Extremities:  No pain or swelling. Skin:  No rashes or skin changes. Neuro:  No headache, numbness or weakness, balance or coordination issues. Endocrine:  No diabetes, thyroid issues, hot flashes or night sweats. Psych:  No mood changes, depression or anxiety. Pain:  No focal pain. Review of systems:  All other systems reviewed and found to be negative.   Physical Exam: Last menstrual period 08/09/2016. GENERAL:  Well developed, well nourished, sitting comfortably in the exam room in no acute distress. MENTAL STATUS:  Alert and oriented to person, place and time. HEAD:  *** hair.  Normocephalic, atraumatic, face symmetric, no Cushingoid features. EYES:   *** eyes.  Pupils equal round and reactive to light and accomodation.  No conjunctivitis or scleral icterus. ENT:  Oropharynx clear without lesion.  Tongue normal. Mucous membranes moist.  RESPIRATORY:  Clear to auscultation without rales, wheezes or rhonchi. CARDIOVASCULAR:  Regular rate and rhythm without murmur, rub or gallop. ABDOMEN:  Soft, non-tender, with active bowel sounds, and no hepatosplenomegaly.  No masses. SKIN:  No rashes, ulcers or lesions. EXTREMITIES: No edema, no skin discoloration or tenderness.  No palpable cords. LYMPH NODES: No palpable cervical, supraclavicular, axillary or inguinal adenopathy  NEUROLOGICAL: Unremarkable. PSYCH:  Appropriate.  No visits with results within 3 Day(s) from this visit.  Latest known visit with results is:  Admission on 09/12/2016, Discharged on 09/12/2016  Component Date Value Ref Range Status  . Sodium 09/12/2016 135  135 - 145 mmol/L Final  . Potassium 09/12/2016 3.6  3.5 - 5.1 mmol/L Final  . Chloride 09/12/2016 106  101 - 111 mmol/L Final  . CO2 09/12/2016 22  22 - 32 mmol/L Final  . Glucose, Bld 09/12/2016 94  65 - 99 mg/dL Final  . BUN 09/12/2016 12  6 - 20 mg/dL Final  . Creatinine, Ser 09/12/2016 0.81  0.44 - 1.00 mg/dL Final  . Calcium 09/12/2016 9.2  8.9 - 10.3 mg/dL Final  . GFR calc non Af Amer 09/12/2016 >60  >60 mL/min Final  . GFR calc Af Amer 09/12/2016 >60  >60 mL/min Final   Comment: (NOTE) The eGFR has been calculated using the CKD EPI equation. This calculation has not been validated in all clinical situations. eGFR's persistently <60 mL/min signify possible Chronic Kidney Disease.   . Anion gap 09/12/2016 7  5 - 15 Final  . Troponin I 09/12/2016 <0.03  <0.03 ng/mL Final  . WBC 09/12/2016 3.7  3.6 - 11.0 K/uL Final  . RBC 09/12/2016 4.36  3.80 - 5.20 MIL/uL Final  . Hemoglobin 09/12/2016 11.5* 12.0 - 16.0 g/dL Final  . HCT 09/12/2016 35.0  35.0 - 47.0 % Final  . MCV 09/12/2016 80.4  80.0 - 100.0 fL Final   . MCH 09/12/2016 26.4  26.0 - 34.0 pg Final  . MCHC 09/12/2016 32.9  32.0 - 36.0 g/dL Final  . RDW 09/12/2016 14.6* 11.5 - 14.5 % Final  . Platelets 09/12/2016 198  150 - 440 K/uL Final  . Preg Test, Ur 09/12/2016 NEGATIVE  NEGATIVE Final   Comment:        THE SENSITIVITY OF THIS METHODOLOGY IS >24 mIU/mL     Assessment:  Jasmine Gordon is a 30 y.o. female ***  Plan: 1. *** 2. *** 3. *** 4. *** 5. ***  Lequita Asal, MD  09/19/2016, 4:32 AM

## 2016-10-03 ENCOUNTER — Inpatient Hospital Stay: Payer: BLUE CROSS/BLUE SHIELD

## 2016-10-03 ENCOUNTER — Inpatient Hospital Stay: Payer: BLUE CROSS/BLUE SHIELD | Attending: Hematology and Oncology | Admitting: Hematology and Oncology

## 2016-10-03 ENCOUNTER — Encounter: Payer: Self-pay | Admitting: Hematology and Oncology

## 2016-10-03 VITALS — BP 131/78 | HR 88 | Temp 97.0°F | Resp 18 | Ht 67.0 in | Wt 220.7 lb

## 2016-10-03 DIAGNOSIS — D500 Iron deficiency anemia secondary to blood loss (chronic): Secondary | ICD-10-CM | POA: Insufficient documentation

## 2016-10-03 DIAGNOSIS — J45909 Unspecified asthma, uncomplicated: Secondary | ICD-10-CM | POA: Insufficient documentation

## 2016-10-03 DIAGNOSIS — G43909 Migraine, unspecified, not intractable, without status migrainosus: Secondary | ICD-10-CM | POA: Diagnosis not present

## 2016-10-03 DIAGNOSIS — F329 Major depressive disorder, single episode, unspecified: Secondary | ICD-10-CM | POA: Diagnosis not present

## 2016-10-03 DIAGNOSIS — F4310 Post-traumatic stress disorder, unspecified: Secondary | ICD-10-CM | POA: Insufficient documentation

## 2016-10-03 DIAGNOSIS — D509 Iron deficiency anemia, unspecified: Secondary | ICD-10-CM | POA: Diagnosis not present

## 2016-10-03 DIAGNOSIS — Z79899 Other long term (current) drug therapy: Secondary | ICD-10-CM | POA: Diagnosis not present

## 2016-10-03 DIAGNOSIS — N920 Excessive and frequent menstruation with regular cycle: Secondary | ICD-10-CM | POA: Insufficient documentation

## 2016-10-03 DIAGNOSIS — K5900 Constipation, unspecified: Secondary | ICD-10-CM | POA: Diagnosis not present

## 2016-10-03 LAB — COMPREHENSIVE METABOLIC PANEL
ALT: 19 U/L (ref 14–54)
AST: 22 U/L (ref 15–41)
Albumin: 3.9 g/dL (ref 3.5–5.0)
Alkaline Phosphatase: 49 U/L (ref 38–126)
Anion gap: 8 (ref 5–15)
BUN: 13 mg/dL (ref 6–20)
CO2: 22 mmol/L (ref 22–32)
Calcium: 8.7 mg/dL — ABNORMAL LOW (ref 8.9–10.3)
Chloride: 103 mmol/L (ref 101–111)
Creatinine, Ser: 0.81 mg/dL (ref 0.44–1.00)
GFR calc Af Amer: >60 mL/min (ref >60)
GFR calc non Af Amer: >60 mL/min (ref >60)
Glucose, Bld: 95 mg/dL (ref 65–99)
Potassium: 3.9 mmol/L (ref 3.5–5.1)
Sodium: 133 mmol/L — ABNORMAL LOW (ref 135–145)
Total Bilirubin: 0.2 mg/dL — ABNORMAL LOW (ref 0.3–1.2)
Total Protein: 7.4 g/dL (ref 6.5–8.1)

## 2016-10-03 LAB — CBC WITH DIFFERENTIAL/PLATELET
Basophils Absolute: 0 10*3/uL (ref 0–0.1)
Basophils Relative: 1 %
Eosinophils Absolute: 0.1 10*3/uL (ref 0–0.7)
Eosinophils Relative: 2 %
HCT: 34.2 % — ABNORMAL LOW (ref 35.0–47.0)
Hemoglobin: 11.1 g/dL — ABNORMAL LOW (ref 12.0–16.0)
Lymphocytes Relative: 31 %
Lymphs Abs: 1.5 10*3/uL (ref 1.0–3.6)
MCH: 25.7 pg — ABNORMAL LOW (ref 26.0–34.0)
MCHC: 32.4 g/dL (ref 32.0–36.0)
MCV: 79.5 fL — ABNORMAL LOW (ref 80.0–100.0)
Monocytes Absolute: 0.3 10*3/uL (ref 0.2–0.9)
Monocytes Relative: 7 %
Neutro Abs: 2.8 10*3/uL (ref 1.4–6.5)
Neutrophils Relative %: 59 %
Platelets: 196 10*3/uL (ref 150–440)
RBC: 4.31 MIL/uL (ref 3.80–5.20)
RDW: 14.3 % (ref 11.5–14.5)
WBC: 4.7 10*3/uL (ref 3.6–11.0)

## 2016-10-03 LAB — IRON AND TIBC
Iron: 40 ug/dL (ref 28–170)
Saturation Ratios: 12 % (ref 10.4–31.8)
TIBC: 340 ug/dL (ref 250–450)
UIBC: 300 ug/dL

## 2016-10-03 LAB — FERRITIN: Ferritin: 12 ng/mL (ref 11–307)

## 2016-10-03 NOTE — Progress Notes (Signed)
[facility] day:  10/03/2016  Chief Complaint: Jasmine Gordon is a 30 y.o. female with microcytic anemia who is referred in consultation by Fredia Sorrow, NP for assessment and management.  HPI:  The patient notes that she has been anemic for awhile.  Her hemoglobin has remained > 11 until recently.  She states that she has been on oral iron x 2 months.  Oral iron has not helped.  She states that oral iron makes her sick.  She describes nausea and constipation.  She notes a history of heavy menses from 12/2015 - 06/2016.  She states that an IUD was placed in 12/2015.  She now has regular menses lasting 5-7 days.   When she had heavy menses, sometimes she would get lightheaded and dizzy.  She describes her diet as 1800 calories. She eats "normal food". She loves spinach and salads.  Symptomatically, she only notes feeling cold. She denies any other symptoms.  She denies any pica.  CBC on 04/02/2016 included a hematocrit 39.2, 1112.7, MCV 81.1, platelets 222,000, white count 4300.  CBC on 06/13/2016 included a hematocrit 36.2, hemoglobin 11.5, MCV 81.1, platelets 210,000 and white count 4100.   CBC on 06/17/2016 included a hematocrit 34.7, hemoglobin 11.3, MCV 79, platelets 257,000, white count 4300 with an [place] of 2021.  Creatinine was 0.73.  Ferritin was 26. Iron saturation was 16% with a TIBC of 272. B12 was 435.  TSH was 1.51.  CBC on 08/25/2016 included a hematocrit 34.1, hemoglobin 11.6, MCV 79.1, platelets 219,000 and white count 5100.  Urinalysis revealed 0-5 red blood cells and no hemoglobin.   Past Medical History:  Diagnosis Date  . Allergy   . Anemia   . Anxiety   . Asthma   . Bronchiolitis   . Depression   . Migraine   . Panic attack   . PTSD (post-traumatic stress disorder) 2016    Past Surgical History:  Procedure Laterality Date  . APPENDECTOMY    . Plantar warts      Family History  Problem Relation Age of Onset  .  Depression Mother   . Mental retardation Mother   . Bipolar disorder Mother   . Diabetes Father   . Stroke Maternal Grandfather     Social History:  reports that she has never smoked. She has never used smokeless tobacco. She reports that she does not drink alcohol or use drugs.  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  The patient is alone today.  Allergies:  Allergies  Allergen Reactions  . Asa [Aspirin] Hives and Shortness Of Breath  . Reglan [Metoclopramide] Hives and Shortness Of Breath  . Turmeric     Current Medications: Current Outpatient Prescriptions  Medication Sig Dispense Refill  . albuterol (PROVENTIL HFA;VENTOLIN HFA) 108 (90 Base) MCG/ACT inhaler Inhale 2 puffs into the lungs every 6 (six) hours as needed for wheezing or shortness of breath. 1 Inhaler 11  . FLOVENT HFA 220 MCG/ACT inhaler   11  . Naltrexone-Bupropion HCl ER (CONTRAVE) 8-90 MG TB12      No current facility-administered medications for this visit.     Review of Systems:  GENERAL:  Feels fine.  Active.  No fevers, sweats or weight loss. PERFORMANCE STATUS (ECOG):  1 HEENT:  No visual changes, runny nose, sore throat, mouth sores or tenderness. Lungs: No  shortness of breath or cough.  No hemoptysis. Cardiac:  No chest pain, palpitations, orthopnea, or PND. GI:  No nausea, vomiting, diarrhea, constipation, melena or hematochezia. GU:  History of heavy menses, improved.  No urgency, frequency, dysuria, or hematuria. Musculoskeletal:  No back pain.  No joint pain.  No muscle tenderness. Extremities:  No pain or swelling. Skin:  No rashes or skin changes. Neuro:  No headache, numbness or weakness, balance or coordination issues. Endocrine:  No diabetes, thyroid issues, hot flashes or night sweats.  Feels cold. Psych:  No mood changes, depression or anxiety. Pain:  No focal pain. Review of  systems:  All other systems reviewed and found to be negative.  Physical Exam: Blood pressure 131/78, pulse 88, temperature 97 F (36.1 C), temperature source Tympanic, resp. rate 18, height _0  (1.702 m), weight 220 lb 10.9 oz (100.1 kg), last menstrual period 08/09/2016. GENERAL:  Well developed, well nourished, woman sitting comfortably in the exam room in no acute distress. MENTAL STATUS:  Alert and oriented to person, place and time. HEAD:  Long black hair pulled back.  Normocephalic, atraumatic, face symmetric, no Cushingoid features. EYES:  Glasses.  brown eyes.  Pupils equal round and reactive to light and accomodation.  No conjunctivitis or scleral icterus. ENT:  Oropharynx clear without lesion.  Tongue normal. Mucous membranes moist.  RESPIRATORY:  Clear to auscultation without rales, wheezes or rhonchi. CARDIOVASCULAR:  Regular rate and rhythm without murmur, rub or gallop. ABDOMEN:  Soft, non-tender, with active bowel sounds, and no appreciable hepatosplenomegaly.  No masses. SKIN:  No rashes, ulcers or lesions. EXTREMITIES: No edema, no skin discoloration or tenderness.  No palpable cords. LYMPH NODES: No palpable cervical, supraclavicular, axillary or inguinal adenopathy  NEUROLOGICAL: Unremarkable. PSYCH:  Appropriate.  No visits with results within 3 Day(s) from this visit.  Latest known visit with results is:  Admission on 09/12/2016, Discharged on 09/12/2016  Component Date Value Ref Range Status  . Sodium 09/12/2016 135  135 - 145 mmol/L Final  . Potassium 09/12/2016 3.6  3.5 - 5.1 mmol/L Final  . Chloride 09/12/2016 106  101 - 111 mmol/L Final  . CO2 09/12/2016 22  22 - 32 mmol/L Final  . Glucose, Bld 09/12/2016 94  65 - 99 mg/dL Final  . BUN 09/12/2016 12  6 - 20 mg/dL Final  . Creatinine, Ser 09/12/2016 0.81  0.44 - 1.00 mg/dL Final  . Calcium 09/12/2016 9.2  8.9 - 10.3 mg/dL Final  . GFR calc non Af Amer 09/12/2016 >60  >60 mL/min Final  . GFR calc Af Amer  09/12/2016 >60  >60 mL/min Final   Comment: (NOTE) The eGFR has been calculated using the CKD EPI equation. This calculation has not been validated in all clinical situations. eGFR's persistently <60 mL/min signify possible Chronic Kidney Disease.   . Anion gap 09/12/2016 7  5 - 15 Final  . Troponin I 09/12/2016 <0.03  <0.03 ng/mL Final  . WBC 09/12/2016 3.7  3.6 - 11.0 K/uL Final  . RBC 09/12/2016 4.36  3.80 - 5.20 MIL/uL Final  . Hemoglobin 09/12/2016 11.5* 12.0 - 16.0 g/dL Final  . HCT 09/12/2016 35.0  35.0 - 47.0 % Final  . MCV 09/12/2016 80.4  80.0 - 100.0 fL Final  . MCH 09/12/2016 26.4  26.0 - 34.0 pg Final  . MCHC 09/12/2016 32.9  32.0 - 36.0 g/dL Final  . RDW 09/12/2016 14.6* 11.5 - 14.5 % Final  . Platelets 09/12/2016 198  150 -  440 K/uL Final  . Preg Test, Ur 09/12/2016 NEGATIVE  NEGATIVE Final   Comment:        THE SENSITIVITY OF THIS METHODOLOGY IS >24 mIU/mL     Assessment:  Jasmine Gordon is a 30 y.o. female with mild microcytic anemia likely due to heavy menses.  Menses has improved in the past 2-3 months after IUD placement.  Diet appears good.  She denies any melena, hematochezia, or hematuria.  Ferritin was 27 on 06/17/2016.  Urinalysis revealed no RBCs on 08/25/2016.  She has been on oral iron x 2 months.  Symptomatically, she feels good.  Exam is unremarkable.  Plan: 1.  Discuss diagnosis and management of microcytic anemia.  She has mild iron deficiency.  Discuss oral iron tolerance.  Discuss trying 1 iron pill a day instead of 2 pills.  Patient to take with OJ or vitamin C to help with absorption.  Discuss iron rich food.  Discuss consideration of IV iron if unsuccessful.  Side effects of IV iron reviewed.  Patent is agreeable with plan. 2.  Labs today:  CBC with diff, ferritin, iron studies. 3.  Guaiac cards x 3. 4.  Ferrous sulfate 325 mg po q day with OJ or vitamin C. 5.  Preauth Venofer. 6.  RTC in 1 week for MD assessment, review labs, and +/-  Venofer.   Lequita Asal, MD  10/03/2016, 11:42 AM

## 2016-10-03 NOTE — Progress Notes (Signed)
Patient here today as new evaluation regarding mytosic anemia.  Referred by Dr. Knox RoyaltyKreb.

## 2016-10-09 ENCOUNTER — Emergency Department
Admission: EM | Admit: 2016-10-09 | Discharge: 2016-10-09 | Disposition: A | Discharge status: Home / Self Care | Payer: BLUE CROSS/BLUE SHIELD | Attending: Emergency Medicine | Admitting: Emergency Medicine

## 2016-10-09 ENCOUNTER — Encounter: Payer: Self-pay | Admitting: Hematology and Oncology

## 2016-10-09 ENCOUNTER — Other Ambulatory Visit: Payer: Self-pay

## 2016-10-09 DIAGNOSIS — G43909 Migraine, unspecified, not intractable, without status migrainosus: Secondary | ICD-10-CM | POA: Diagnosis not present

## 2016-10-09 DIAGNOSIS — F410 Panic disorder [episodic paroxysmal anxiety] without agoraphobia: Secondary | ICD-10-CM

## 2016-10-09 DIAGNOSIS — Z79899 Other long term (current) drug therapy: Secondary | ICD-10-CM | POA: Diagnosis not present

## 2016-10-09 DIAGNOSIS — J45909 Unspecified asthma, uncomplicated: Secondary | ICD-10-CM | POA: Diagnosis not present

## 2016-10-09 DIAGNOSIS — R51 Headache: Secondary | ICD-10-CM | POA: Diagnosis present

## 2016-10-09 LAB — CBC WITH DIFFERENTIAL/PLATELET
Basophils Absolute: 0 10*3/uL (ref 0–0.1)
Basophils Relative: 1 %
Eosinophils Absolute: 0.1 10*3/uL (ref 0–0.7)
Eosinophils Relative: 1 %
HCT: 34.4 % — ABNORMAL LOW (ref 35.0–47.0)
Hemoglobin: 11.4 g/dL — ABNORMAL LOW (ref 12.0–16.0)
Lymphocytes Relative: 34 %
Lymphs Abs: 2 10*3/uL (ref 1.0–3.6)
MCH: 26.3 pg (ref 26.0–34.0)
MCHC: 33.3 g/dL (ref 32.0–36.0)
MCV: 79.1 fL — ABNORMAL LOW (ref 80.0–100.0)
Monocytes Absolute: 0.3 10*3/uL (ref 0.2–0.9)
Monocytes Relative: 4 %
Neutro Abs: 3.4 10*3/uL (ref 1.4–6.5)
Neutrophils Relative %: 60 %
Platelets: 214 10*3/uL (ref 150–440)
RBC: 4.34 MIL/uL (ref 3.80–5.20)
RDW: 14.1 % (ref 11.5–14.5)
WBC: 5.7 10*3/uL (ref 3.6–11.0)

## 2016-10-09 LAB — COMPREHENSIVE METABOLIC PANEL
ALT: 21 U/L (ref 14–54)
AST: 22 U/L (ref 15–41)
Albumin: 4 g/dL (ref 3.5–5.0)
Alkaline Phosphatase: 55 U/L (ref 38–126)
Anion gap: 7 (ref 5–15)
BUN: 16 mg/dL (ref 6–20)
CO2: 20 mmol/L — ABNORMAL LOW (ref 22–32)
Calcium: 9.4 mg/dL (ref 8.9–10.3)
Chloride: 110 mmol/L (ref 101–111)
Creatinine, Ser: 0.8 mg/dL (ref 0.44–1.00)
GFR calc Af Amer: >60 mL/min (ref >60)
GFR calc non Af Amer: >60 mL/min (ref >60)
Glucose, Bld: 163 mg/dL — ABNORMAL HIGH (ref 65–99)
Potassium: 3.2 mmol/L — ABNORMAL LOW (ref 3.5–5.1)
Sodium: 137 mmol/L (ref 135–145)
Total Bilirubin: 0.4 mg/dL (ref 0.3–1.2)
Total Protein: 7.3 g/dL (ref 6.5–8.1)

## 2016-10-09 MED ORDER — IBUPROFEN 600 MG PO TABS
600.0000 mg | ORAL_TABLET | Freq: Once | ORAL | Status: AC
  Administered 2016-10-09: 600 mg via ORAL

## 2016-10-09 MED ORDER — IBUPROFEN 400 MG PO TABS
ORAL_TABLET | ORAL | Status: AC
  Filled 2016-10-09: qty 2

## 2016-10-09 NOTE — ED Notes (Signed)
Pt presented to ED with c/o headache, muscle cramps, difficulty breathing and dizziness; pt says she thinks she's having a panic attack; very talkative, in complete coherent sentences; pt became loud at stat registration stating "please don't make me sit here, please don't make me sit here"; became louder stating "you're just going to leave me here if I can't breathe?!"; explained to pt that she needs to be triaged and assessed by the nurse before any decisions are made; pt verbalized understanding;

## 2016-10-09 NOTE — ED Provider Notes (Signed)
[facility] Emergency Department Provider Note  ____________________________________________   First MD Initiated Contact with Patient 10/09/16 [phone]     (approximate)  I have reviewed the triage vital signs and the nursing notes.   HISTORY  Chief Complaint Headache    HPI Jasmine Gordon is a 30 y.o. female whose past medical history includes PTSD, panic attacks, depression, and migraines as is for evaluation of gradual onset severe headache.  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  She was working for hours without a break and developed a global throbbing headache with some associated nausea and photophobia.  She tried to go home after work and get some rest but the pain was too severe and nothing made it better or worse.  She describes that she started to have a panic attack with rapid breathing, palpitations, and tingling in her extremities similar to prior panic attacks.  Her symptoms were still severe after getting to the emergency department.  However while awaiting a room they have completely resolved and she has been sleeping comfortably.  She currently rates her pain as a 0 and has no other accompanied symptoms.  Denies any history of visual changes, fever/chills, abdominal pain, vomiting, or recent illness.   Past Medical History:  Diagnosis Date  . Allergy   . Anemia   . Anxiety   . Asthma   . Bronchiolitis   . Depression   . Migraine   . Panic attack   . PTSD (post-traumatic stress disorder) 2016    Patient Active Problem List   Diagnosis Date Noted  . Microcytic anemia 07/15/2016  . Menorrhagia with regular cycle 06/27/2016  . Asthma 06/17/2016  . Anxiety and depression 06/17/2016  . BMI 38.0-38.9,adult 06/17/2016    Past Surgical History:  Procedure Laterality Date  . APPENDECTOMY    . Plantar warts      Prior to Admission medications   Medication Sig Start Date End Date Taking?  Authorizing Provider  albuterol (PROVENTIL HFA;VENTOLIN HFA) 108 (90 Base) MCG/ACT inhaler Inhale 2 puffs into the lungs every 6 (six) hours as needed for wheezing or shortness of breath. 06/17/16   Amy Rusty AusLauren Krebs, NP  FLOVENT HFA 220 MCG/ACT inhaler  06/22/16   Historical Provider, MD  Naltrexone-Bupropion HCl ER (CONTRAVE) 8-90 MG TB12  08/26/16   Historical Provider, MD    Allergies Asa [aspirin]; Reglan [metoclopramide]; and Turmeric  Family History  Problem Relation Age of Onset  . Depression Mother   . Mental retardation Mother   . Bipolar disorder Mother   . Diabetes Father   . Stroke Maternal Grandfather     Social History Social History  Substance Use Topics  . Smoking status: Never Smoker  . Smokeless tobacco: Never Used  . Alcohol use No     Comment: occ    Review of Systems Constitutional: No fever/chills Eyes: No visual changes. Mild photophobia while she was having a headache ENT: No sore throat. Cardiovascular: Denies chest pain. Respiratory: Denies shortness of breath. Gastrointestinal: No abdominal pain.  nausea, no vomiting.  No diarrhea.  No constipation. Genitourinary: Negative for dysuria. Musculoskeletal: Negative for back pain. Skin: Negative for rash. Neurological: Severe global throbbing headache, now completely resolved.  Some tingling in her extremities while she was having a panic attack  10-point ROS otherwise negative.  ____________________________________________   PHYSICAL EXAM:  VITAL SIGNS: ED Triage Vitals  Enc Vitals Group     BP  10/09/16 0255 135/74     Pulse Rate 10/09/16 0255 (!) 112     Resp 10/09/16 0255 16     Temp 10/09/16 0255 98.4 F (36.9 C)     Temp Source 10/09/16 0255 Oral     SpO2 10/09/16 0255 100 %     Weight 10/09/16 0255 240 lb (108.9 kg)     Height 10/09/16 0255 5\' 7"  (1.702 m)     Head Circumference --      Peak Flow --      Pain Score 10/09/16 0256 9     Pain Loc --      Pain Edu? --      Excl. in  GC? --     Constitutional: Alert and oriented. Well appearing and in no acute distress. Eyes: Conjunctivae are normal. PERRL. EOMI. Head: Atraumatic. Nose: No congestion/rhinnorhea. Mouth/Throat: Mucous membranes are moist.  Oropharynx non-erythematous. Neck: No stridor.  No meningeal signs.   Cardiovascular: Normal rate, regular rhythm. Good peripheral circulation. Grossly normal heart sounds. Respiratory: Normal respiratory effort.  No retractions. Lungs CTAB. Gastrointestinal: Soft and nontender. No distention.  Musculoskeletal: No lower extremity tenderness nor edema. No gross deformities of extremities. Neurologic:  Normal speech and language. No gross focal neurologic deficits are appreciated.  Skin:  Skin is warm, dry and intact. No rash noted. Psychiatric: Mood and affect are normal. Speech and behavior are normal.  ____________________________________________   LABS (all labs ordered are listed, but only abnormal results are displayed)  Labs Reviewed  CBC WITH DIFFERENTIAL/PLATELET - Abnormal; Notable for the following:       Result Value   Hemoglobin 11.4 (*)    HCT 34.4 (*)    MCV 79.1 (*)    All other components within normal limits  COMPREHENSIVE METABOLIC PANEL - Abnormal; Notable for the following:    Potassium 3.2 (*)    CO2 20 (*)    Glucose, Bld 163 (*)    All other components within normal limits   ____________________________________________  EKG  ED ECG REPORT I, Danyael Alipio, the attending physician, personally viewed and interpreted this ECG.  Date: 10/09/2016 EKG Time: 03:10 Rate: 106 Rhythm: Sinus tachycardia QRS Axis: normal Intervals: normal ST/T Wave abnormalities: normal Conduction Disturbances: none Narrative Interpretation: unremarkable  ____________________________________________  RADIOLOGY   No results found.  ____________________________________________   PROCEDURES  Procedure(s) performed:    Procedures   Critical Care performed: No ____________________________________________   INITIAL IMPRESSION / ASSESSMENT AND PLAN / ED COURSE  Pertinent labs & imaging results that were available during my care of the patient were reviewed by me and considered in my medical decision making (see chart for details).  The patient's symptoms have completely resolved by the time I saw her.  I believe she was having a migraine which was exacerbated with a panic attack.  But at this time there is no other intervention required as she is asymptomatic and she is comfortable with the plan to go home and get some rest.  Her screening exam is reassuring with no evidence of acute or emergent medical condition.   ____________________________________________  FINAL CLINICAL IMPRESSION(S) / ED DIAGNOSES  Final diagnoses:  Migraine without status migrainosus, not intractable, unspecified migraine type  Panic attack     MEDICATIONS GIVEN DURING THIS VISIT:  Medications  ibuprofen (ADVIL,MOTRIN) 400 MG tablet (  Not Given 10/09/16 0314)  ibuprofen (ADVIL,MOTRIN) tablet 600 mg (600 mg Oral Given 10/09/16 0314)     NEW OUTPATIENT MEDICATIONS STARTED DURING  THIS VISIT:  New Prescriptions   No medications on file    Modified Medications   No medications on file    Discontinued Medications   No medications on file     Note:  This document was prepared using Dragon voice recognition software and may include unintentional dictation errors.    Loleta Roseory Lashan Macias, MD 10/09/16 [phone]

## 2016-10-09 NOTE — ED Triage Notes (Signed)
Pt states she started to have a "migraine" at 1700 yesterday. Pt states she now feels like "my whole body is shaking and I can't calm down". Pt denies fever, nausea. Pt states "i just want to get my body to calm down". perrl 3mm and brisk. Pt states she is taking contrave for weight loss.

## 2016-10-10 ENCOUNTER — Emergency Department
Admission: EM | Admit: 2016-10-10 | Discharge: 2016-10-10 | Disposition: A | Discharge status: Home / Self Care | Payer: BLUE CROSS/BLUE SHIELD | Attending: Emergency Medicine | Admitting: Emergency Medicine

## 2016-10-10 ENCOUNTER — Other Ambulatory Visit: Payer: Self-pay

## 2016-10-10 ENCOUNTER — Encounter: Payer: Self-pay | Admitting: *Deleted

## 2016-10-10 ENCOUNTER — Emergency Department: Payer: BLUE CROSS/BLUE SHIELD

## 2016-10-10 ENCOUNTER — Inpatient Hospital Stay: Payer: BLUE CROSS/BLUE SHIELD | Admitting: Hematology and Oncology

## 2016-10-10 ENCOUNTER — Other Ambulatory Visit: Payer: Self-pay | Admitting: Hematology and Oncology

## 2016-10-10 ENCOUNTER — Inpatient Hospital Stay: Payer: BLUE CROSS/BLUE SHIELD

## 2016-10-10 DIAGNOSIS — J45909 Unspecified asthma, uncomplicated: Secondary | ICD-10-CM | POA: Diagnosis not present

## 2016-10-10 DIAGNOSIS — Z79899 Other long term (current) drug therapy: Secondary | ICD-10-CM | POA: Insufficient documentation

## 2016-10-10 DIAGNOSIS — R531 Weakness: Secondary | ICD-10-CM | POA: Diagnosis not present

## 2016-10-10 DIAGNOSIS — R5383 Other fatigue: Secondary | ICD-10-CM | POA: Diagnosis present

## 2016-10-10 DIAGNOSIS — D500 Iron deficiency anemia secondary to blood loss (chronic): Secondary | ICD-10-CM

## 2016-10-10 LAB — BASIC METABOLIC PANEL
Anion gap: 7 (ref 5–15)
BUN: 10 mg/dL (ref 6–20)
CO2: 23 mmol/L (ref 22–32)
Calcium: 8.9 mg/dL (ref 8.9–10.3)
Chloride: 108 mmol/L (ref 101–111)
Creatinine, Ser: 0.75 mg/dL (ref 0.44–1.00)
GFR calc Af Amer: >60 mL/min (ref >60)
GFR calc non Af Amer: >60 mL/min (ref >60)
Glucose, Bld: 95 mg/dL (ref 65–99)
Potassium: 3.4 mmol/L — ABNORMAL LOW (ref 3.5–5.1)
Sodium: 138 mmol/L (ref 135–145)

## 2016-10-10 LAB — CBC
HCT: 35.1 % (ref 35.0–47.0)
Hemoglobin: 11.6 g/dL — ABNORMAL LOW (ref 12.0–16.0)
MCH: 26.3 pg (ref 26.0–34.0)
MCHC: 33.1 g/dL (ref 32.0–36.0)
MCV: 79.5 fL — ABNORMAL LOW (ref 80.0–100.0)
Platelets: 211 10*3/uL (ref 150–440)
RBC: 4.41 MIL/uL (ref 3.80–5.20)
RDW: 14.4 % (ref 11.5–14.5)
WBC: 4.5 10*3/uL (ref 3.6–11.0)

## 2016-10-10 LAB — URINALYSIS COMPLETE WITH MICROSCOPIC (ARMC ONLY)
Bilirubin Urine: NEGATIVE
Glucose, UA: NEGATIVE mg/dL
Ketones, ur: NEGATIVE mg/dL
Nitrite: NEGATIVE
Protein, ur: NEGATIVE mg/dL
Specific Gravity, Urine: 1.016 (ref 1.005–1.030)
pH: 5 (ref 5.0–8.0)

## 2016-10-10 LAB — TROPONIN I: Troponin I: <0.03 ng/mL (ref <0.03)

## 2016-10-10 LAB — PREGNANCY, URINE: Preg Test, Ur: NEGATIVE

## 2016-10-10 NOTE — ED Provider Notes (Signed)
[facility] Emergency Department Provider Note  Time seen: 2:58 PM  I have reviewed the triage vital signs and the nursing notes.   HISTORY  Chief Complaint Shortness of Breath    HPI Jasmine Gordon is a 30 y.o. female who presents to the emergency department today for generalized fatigue. According to the patient over the past 2 or 3 days she has felt extreme weakness. Denies any other symptoms. Denies fever, abdominal pain, chest pain, cough or congestion, dysuria. States her last period was several days ago. States she is just been feeling very tired. She is trying to get sleep and states she is working more than normal recently for the holidays.  Past Medical History:  Diagnosis Date  . Allergy   . Anemia   . Anxiety   . Asthma   . Bronchiolitis   . Depression   . Migraine   . Panic attack   . PTSD (post-traumatic stress disorder) 2016    Patient Active Problem List   Diagnosis Date Noted  . Iron deficiency anemia due to chronic blood loss 10/03/2016  . Microcytic anemia 07/15/2016  . Menorrhagia with regular cycle 06/27/2016  . Asthma 06/17/2016  . Anxiety and depression 06/17/2016  . BMI 38.0-38.9,adult 06/17/2016    Past Surgical History:  Procedure Laterality Date  . APPENDECTOMY    . Plantar warts      Prior to Admission medications   Medication Sig Start Date End Date Taking? Authorizing Provider  albuterol (PROVENTIL HFA;VENTOLIN HFA) 108 (90 Base) MCG/ACT inhaler Inhale 2 puffs into the lungs every 6 (six) hours as needed for wheezing or shortness of breath. 06/17/16   Amy Rusty AusLauren Krebs, NP  FLOVENT HFA 220 MCG/ACT inhaler  06/22/16   Historical Provider, MD  Naltrexone-Bupropion HCl ER (CONTRAVE) 8-90 MG TB12  08/26/16   Historical Provider, MD    Allergies  Allergen Reactions  . Asa [Aspirin] Hives and Shortness Of Breath  . Reglan [Metoclopramide] Hives and Shortness Of Breath  . Turmeric     Family History  Problem  Relation Age of Onset  . Depression Mother   . Mental retardation Mother   . Bipolar disorder Mother   . Diabetes Father   . Stroke Maternal Grandfather     Social History Social History  Substance Use Topics  . Smoking status: Never Smoker  . Smokeless tobacco: Never Used  . Alcohol use No     Comment: occ    Review of Systems Constitutional: Negative for fever. Cardiovascular: Negative for chest pain. Respiratory: Negative for shortness of breath. Gastrointestinal: Negative for abdominal pain, vomiting and diarrhea. Genitourinary: Negative for dysuria. Neurological: Negative for headache 10-point ROS otherwise negative.  ____________________________________________   PHYSICAL EXAM:  VITAL SIGNS: ED Triage Vitals  Enc Vitals Group     BP 10/10/16 1117 123/81     Pulse Rate 10/10/16 1117 95     Resp 10/10/16 1117 20     Temp 10/10/16 1117 98.1 F (36.7 C)     Temp Source 10/10/16 1117 Oral     SpO2 10/10/16 1117 98 %     Weight 10/10/16 1118 240 lb (108.9 kg)     Height 10/10/16 1118 5\' 7"  (1.702 m)     Head Circumference --      Peak Flow --      Pain Score 10/10/16 1121 0     Pain Loc --      Pain Edu? --  Excl. in GC? --     Constitutional: Alert and oriented. Well appearing and in no distress. Eyes: Normal exam ENT   Head: Normocephalic and atraumatic.   Mouth/Throat: Mucous membranes are moist. Cardiovascular: Normal rate, regular rhythm. No murmur Respiratory: Normal respiratory effort without tachypnea nor retractions. Breath sounds are clear  Gastrointestinal: Soft and nontender. No distention.   Musculoskeletal: Nontender with normal range of motion in all extremities. Neurologic:  Normal speech and language. No gross focal neurologic deficits Skin:  Skin is warm, dry and intact.  Psychiatric: Mood and affect are normal.   ____________________________________________    EKG  EKG reviewed and interpreted by myself shows sinus  tachycardia 101 bpm, narrow QRS, normal axis, normal intervals, no acute changes, overall normal EKG.  ____________________________________________    RADIOLOGY  Chest x-ray shows chronic bronchitic changes  ____________________________________________   INITIAL IMPRESSION / ASSESSMENT AND PLAN / ED COURSE  Pertinent labs & imaging results that were available during my care of the patient were reviewed by me and considered in my medical decision making (see chart for details).  Patient presents for generalized weakness. Patient has a normal physical examination. Appears well on examination. Labs are within normal limits. Urinalysis is negative. Urine pregnancy test is negative. I discussed with the patient obtaining more rest at home, increasing her fluids and following up with a primary care doctor in 2-3 days for reevaluation if not improved. Patient is agreeable to plan.  ____________________________________________   FINAL CLINICAL IMPRESSION(S) / ED DIAGNOSES  Generalized weakness    Minna AntisKevin Ned Kakar, MD 10/10/16 1500

## 2016-10-10 NOTE — ED Triage Notes (Signed)
Pt arrives with complaints of feeling lightheaded and SOB with chest discomfort, states "I just dont feel right", states she was seen in ED recently for migraine, states "I felt this way last time I had bronchitis", pt awake and alert in no acute distress

## 2016-10-10 NOTE — ED Notes (Signed)
States she has an appt with hemotolgy this afternoon for hx of low iron, states hx of anemia

## 2016-10-10 NOTE — Progress Notes (Deleted)
[place] [facility] day:  10/10/2016  Chief Complaint: Jasmine Gordon is a 30 y.o. female with microcytic anemia who is seen for review of work-up and discussion regarding tolerance of oral iron.  HPI:  The patient was last seen in the hematology clinic on 10/03/2016 for initial consultation.  At that time, she had a milf microcytic anemia felt secondary to heavy menses.  Menses was improving after IUD placement.  She was intolerant to 2 iron pills a day.  We discussed 1 iron pill a day with OJ or vitamin C and eating iron rich foods.  If she did not improve, we discussed Venofer.  Labs on 10/03/2016 revealed a hematocrit 34.2, hemoglobin 11.1, MCV 79.5, platelets 196,000, white count 4700 with an ANC of 2800.  Comprehensive metabolic panel included a sodium of 133, creatinine 0.81, and calcium 8.7.  Liver function tests were normal.  Ferritin was 12.  Iron studies included a saturation of 12% and TIBC of 340.  She was seen in the ER at [facility] on 10/09/2016 with a headache.  She was working long hours without a break and developed a sever headache which progressed to a panic attack.  Symptoms resolved while waiting to be seen.  Labs included a hematocrit of 34.4, hemoglobin 11.4, and MCV 79.1.    Past Medical History:  Diagnosis Date  . Allergy   . Anemia   . Anxiety   . Asthma   . Bronchiolitis   . Depression   . Migraine   . Panic attack   . PTSD (post-traumatic stress disorder) 2016    Past Surgical History:  Procedure Laterality Date  . APPENDECTOMY    . Plantar warts      Family History  Problem Relation Age of Onset  . Depression Mother   . Mental retardation Mother   . Bipolar disorder Mother   . Diabetes Father   . Stroke Maternal Grandfather     Social History:  reports that she has never smoked. She has never used smokeless tobacco. She reports that she does not drink alcohol or use drugs.  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  She works 10 hours/day.  The patient is alone today.  Allergies:  Allergies  Allergen Reactions  . Asa [Aspirin] Hives and Shortness Of Breath  . Reglan [Metoclopramide] Hives and Shortness Of Breath  . Turmeric     Current Medications: Current Outpatient Prescriptions  Medication Sig Dispense Refill  . albuterol (PROVENTIL HFA;VENTOLIN HFA) 108 (90 Base) MCG/ACT inhaler Inhale 2 puffs into the lungs every 6 (six) hours as needed for wheezing or shortness of breath. 1 Inhaler 11  . FLOVENT HFA 220 MCG/ACT inhaler   11  . Naltrexone-Bupropion HCl ER (CONTRAVE) 8-90 MG TB12      No current facility-administered medications for this visit.     Review of Systems:  GENERAL:  Feels fine.  Active.  No fevers, sweats or weight loss. PERFORMANCE STATUS (ECOG):  1 HEENT:  No visual changes, runny nose, sore throat, mouth sores or tenderness. Lungs: No shortness of breath or cough.  No hemoptysis. Cardiac:  No chest pain, palpitations, orthopnea, or PND. GI:  No nausea, vomiting, diarrhea, constipation, melena or hematochezia. GU:  History of heavy menses, improved.  No urgency, frequency, dysuria, or hematuria. Musculoskeletal:  No back pain.  No joint pain.  No muscle tenderness.  Extremities:  No pain or swelling. Skin:  No rashes or skin changes. Neuro:  No headache, numbness or weakness, balance or coordination issues. Endocrine:  No diabetes, thyroid issues, hot flashes or night sweats.  Feels cold. Psych:  No mood changes, depression or anxiety. Pain:  No focal pain. Review of systems:  All other systems reviewed and found to be negative.  Physical Exam: Last menstrual period 09/05/2016. GENERAL:  Well developed, well nourished, woman sitting comfortably in the exam room in no acute distress. MENTAL STATUS:  Alert and oriented to person, place and time. HEAD:  Long black hair pulled back.   Normocephalic, atraumatic, face symmetric, no Cushingoid features. EYES:  Glasses.  brown eyes.  Pupils equal round and reactive to light and accomodation.  No conjunctivitis or scleral icterus. ENT:  Oropharynx clear without lesion.  Tongue normal. Mucous membranes moist.  RESPIRATORY:  Clear to auscultation without rales, wheezes or rhonchi. CARDIOVASCULAR:  Regular rate and rhythm without murmur, rub or gallop. ABDOMEN:  Soft, non-tender, with active bowel sounds, and no appreciable hepatosplenomegaly.  No masses. SKIN:  No rashes, ulcers or lesions. EXTREMITIES: No edema, no skin discoloration or tenderness.  No palpable cords. LYMPH NODES: No palpable cervical, supraclavicular, axillary or inguinal adenopathy  NEUROLOGICAL: Unremarkable. PSYCH:  Appropriate.  Admission on 10/09/2016, Discharged on 10/09/2016  Component Date Value Ref Range Status  . WBC 10/09/2016 5.7  3.6 - 11.0 K/uL Final  . RBC 10/09/2016 4.34  3.80 - 5.20 MIL/uL Final  . Hemoglobin 10/09/2016 11.4* 12.0 - 16.0 g/dL Final  . HCT 10/09/2016 34.4* 35.0 - 47.0 % Final  . MCV 10/09/2016 79.1* 80.0 - 100.0 fL Final  . MCH 10/09/2016 26.3  26.0 - 34.0 pg Final  . MCHC 10/09/2016 33.3  32.0 - 36.0 g/dL Final  . RDW 10/09/2016 14.1  11.5 - 14.5 % Final  . Platelets 10/09/2016 214  150 - 440 K/uL Final  . Neutrophils Relative % 10/09/2016 60  % Final  . Neutro Abs 10/09/2016 3.4  1.4 - 6.5 K/uL Final  . Lymphocytes Relative 10/09/2016 34  % Final  . Lymphs Abs 10/09/2016 2.0  1.0 - 3.6 K/uL Final  . Monocytes Relative 10/09/2016 4  % Final  . Monocytes Absolute 10/09/2016 0.3  0.2 - 0.9 K/uL Final  . Eosinophils Relative 10/09/2016 1  % Final  . Eosinophils Absolute 10/09/2016 0.1  0 - 0.7 K/uL Final  . Basophils Relative 10/09/2016 1  % Final  . Basophils Absolute 10/09/2016 0.0  0 - 0.1 K/uL Final  . Sodium 10/09/2016 137  135 - 145 mmol/L Final  . Potassium 10/09/2016 3.2* 3.5 - 5.1 mmol/L Final  . Chloride  10/09/2016 110  101 - 111 mmol/L Final  . CO2 10/09/2016 20* 22 - 32 mmol/L Final  . Glucose, Bld 10/09/2016 163* 65 - 99 mg/dL Final  . BUN 10/09/2016 16  6 - 20 mg/dL Final  . Creatinine, Ser 10/09/2016 0.80  0.44 - 1.00 mg/dL Final  . Calcium 10/09/2016 9.4  8.9 - 10.3 mg/dL Final  . Total Protein 10/09/2016 7.3  6.5 - 8.1 g/dL Final  . Albumin 10/09/2016 4.0  3.5 - 5.0 g/dL Final  . AST 10/09/2016 22  15 - 41 U/L Final  . ALT 10/09/2016 21  14 - 54 U/L Final  . Alkaline Phosphatase 10/09/2016 55  38 - 126 U/L Final  . Total Bilirubin 10/09/2016 0.4  0.3 - 1.2 mg/dL Final  . GFR calc non Af  Amer 10/09/2016 >60  >60 mL/min Final  . GFR calc Af Amer 10/09/2016 >60  >60 mL/min Final   Comment: (NOTE) The eGFR has been calculated using the CKD EPI equation. This calculation has not been validated in all clinical situations. eGFR's persistently <60 mL/min signify possible Chronic Kidney Disease.   . Anion gap 10/09/2016 7  5 - 15 Final    Assessment:  Jasmine Gordon is a 30 y.o. female with mild microcytic anemia likely due to heavy menses.  Menses has improved in the past 2-3 months after IUD placement.  Diet appears good.  She denies any melena, hematochezia, or hematuria.  Work-up on 10/03/2016 revealed a hematocrit 34.2, hemoglobin 11.1, MCV 79.5, platelets 196,000, white count 4700 with an ANC of 2800.  Creatinine was 0.81.  Liver function tests were normal.  Ferritin was 12.  Iron studies included a saturation of 12% and TIBC of 340.  Urinalysis revealed no RBCs on 08/25/2016.  She has been on oral iron x 2 months.  Symptomatically, she feels good.  Exam is unremarkable.  Plan: 1.  Discuss labs and microcytic RBC indices with only mild anemia.    diagnosis and management of microcytic anemia.  She has mild iron deficiency.  Discuss oral iron tolerance.  Discuss trying 1 iron pill a day instead of 2 pills.  Patient to take with OJ or vitamin C to help with absorption.   Discuss iron rich food.  Discuss consideration of IV iron if unsuccessful.  Side effects of IV iron reviewed.  Patent is agreeable with plan. 2.  Labs today:  CBC with diff, ferritin, iron studies. 3.  Guaiac cards x 3. 4.  Ferrous sulfate 325 mg po q day with OJ or vitamin C. 5.  Preauth Venofer. 6.  RTC in 1 week for MD assessment, review labs, and +/- Venofer.   Lequita Asal, MD  10/10/2016, 3:05 AM

## 2016-10-11 LAB — POCT PREGNANCY, URINE: Preg Test, Ur: NEGATIVE

## 2016-10-21 ENCOUNTER — Inpatient Hospital Stay: Payer: BLUE CROSS/BLUE SHIELD

## 2016-10-21 ENCOUNTER — Encounter: Payer: Self-pay | Admitting: Hematology and Oncology

## 2016-10-21 ENCOUNTER — Inpatient Hospital Stay: Payer: BLUE CROSS/BLUE SHIELD | Attending: Hematology and Oncology | Admitting: Hematology and Oncology

## 2016-10-21 VITALS — BP 122/72 | HR 116 | Temp 96.7°F | Resp 20 | Wt 246.3 lb

## 2016-10-21 DIAGNOSIS — D500 Iron deficiency anemia secondary to blood loss (chronic): Secondary | ICD-10-CM

## 2016-10-21 DIAGNOSIS — D509 Iron deficiency anemia, unspecified: Secondary | ICD-10-CM | POA: Diagnosis not present

## 2016-10-21 DIAGNOSIS — Z79899 Other long term (current) drug therapy: Secondary | ICD-10-CM | POA: Diagnosis not present

## 2016-10-21 NOTE — Progress Notes (Signed)
Patient not able to do the stool cards given at last visit due starting her menstrual cycle at same time.  She is has a headache today and feels very anxious.  Her pulse ranged from 114-120.

## 2016-10-21 NOTE — Progress Notes (Signed)
[place] [facility] day:  10/21/2016  Chief Complaint: Jasmine Gordon is a 30 y.o. female with iron deficiency anemia who is seen for review of work-up and discussion regarding tolerance of oral iron.  HPI:  The patient was last seen in the hematology clinic on 10/03/2016 for initial consultation.  At that time, she had a mild microcytic anemia felt secondary to heavy menses.  Menses was improving after IUD placement.  She was intolerant to 2 iron pills a day.  We discussed 1 iron pill a day with OJ or vitamin C and eating iron rich foods.  If she did not improve, we discussed Venofer.  Labs on 10/03/2016 revealed a hematocrit 34.2, hemoglobin 11.1, MCV 79.5, platelets 196,000, white count 4700 with an ANC of 2800.  Comprehensive metabolic panel included a sodium of 133, creatinine 0.81, and calcium 8.7.  Liver function tests were normal.  Ferritin was 12.  Iron studies included a saturation of 12% and TIBC of 340.  She was seen in the ER at [facility] on 10/09/2016 with a headache, fatigue, and general weakness.  She was working long hours without a break and developed a severe headache which progressed to a panic attack.  Symptoms resolved while waiting to be seen.  Labs included a hematocrit of 34.4, hemoglobin 11.4, and MCV 79.1.  Symptomatically, he continues to have a headache as she is "running around all day".  She still is tired. She is working. She is off 3 days a week. She is taking oral iron "sometimes".  She has not turned in her guaiac cards.  She has had menses during the interim described as "not bad".   Past Medical History:  Diagnosis Date  . Allergy   . Anemia   . Anxiety   . Asthma   . Bronchiolitis   . Depression   . Migraine   . Panic attack   . PTSD (post-traumatic stress disorder) 2016    Past Surgical History:  Procedure Laterality Date  . APPENDECTOMY    . Plantar warts      Family History  Problem Relation Age of Onset   . Depression Mother   . Mental retardation Mother   . Bipolar disorder Mother   . Diabetes Father   . Stroke Maternal Grandfather     Social History:  reports that she has never smoked. She has never used smokeless tobacco. She reports that she does not drink alcohol or use drugs.  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  She is off 3 days/week.  The patient is alone today.  Allergies:  Allergies  Allergen Reactions  . Asa [Aspirin] Hives and Shortness Of Breath  . Reglan [Metoclopramide] Hives and Shortness Of Breath  . Turmeric     Current Medications: Current Outpatient Prescriptions  Medication Sig Dispense Refill  . albuterol (PROVENTIL HFA;VENTOLIN HFA) 108 (90 Base) MCG/ACT inhaler Inhale 2 puffs into the lungs every 6 (six) hours as needed for wheezing or shortness of breath. 1 Inhaler 11  . FLOVENT HFA 220 MCG/ACT inhaler   11  . Naltrexone-Bupropion HCl ER (CONTRAVE) 8-90 MG TB12      No current facility-administered medications for this visit.     Review of Systems:  GENERAL:  Still tired after working.  No fevers or sweats.  Weight up. PERFORMANCE STATUS (ECOG):  1 HEENT:  No visual changes, runny nose, sore throat, mouth sores or tenderness. Lungs: No shortness of breath or cough.  No hemoptysis. Cardiac:  No chest pain, palpitations, orthopnea, or PND. GI:  No nausea, vomiting, diarrhea, constipation, melena or hematochezia. GU:  History of heavy menses, improved.  No urgency, frequency, dysuria, or hematuria. Musculoskeletal:  No back pain.  No joint pain.  No muscle tenderness. Extremities:  No pain or swelling. Skin:  No rashes or skin changes. Neuro:  No headache, numbness or weakness, balance or coordination issues. Endocrine:  No diabetes, thyroid issues, hot flashes or night sweats.  Feels cold. Psych:  Anxiety, less.  No mood changes.  No  depression. Pain:  No focal pain. Review of systems:  All other systems reviewed and found to be negative.  Physical Exam: Blood pressure 122/72, pulse (!) 116, temperature (!) 96.7 F (35.9 C), temperature source Tympanic, resp. rate 20, weight 246 lb 4.1 oz (111.7 kg), last menstrual period 09/05/2016. GENERAL:  Well developed, well nourished, woman sitting comfortably in the exam room in no acute distress. MENTAL STATUS:  Alert and oriented to person, place and time. HEAD:  Long black hair with maroon highlights.  Normocephalic, atraumatic, face symmetric, no Cushingoid features. EYES:  Glasses.  brown eyes.  No conjunctivitis or scleral icterus. NEUROLOGICAL: Unremarkable. PSYCH:  Appropriate.   No visits with results within 3 Day(s) from this visit.  Latest known visit with results is:  Admission on 10/10/2016, Discharged on 10/10/2016  Component Date Value Ref Range Status  . Sodium 10/10/2016 138  135 - 145 mmol/L Final  . Potassium 10/10/2016 3.4* 3.5 - 5.1 mmol/L Final  . Chloride 10/10/2016 108  101 - 111 mmol/L Final  . CO2 10/10/2016 23  22 - 32 mmol/L Final  . Glucose, Bld 10/10/2016 95  65 - 99 mg/dL Final  . BUN 10/10/2016 10  6 - 20 mg/dL Final  . Creatinine, Ser 10/10/2016 0.75  0.44 - 1.00 mg/dL Final  . Calcium 10/10/2016 8.9  8.9 - 10.3 mg/dL Final  . GFR calc non Af Amer 10/10/2016 >60  >60 mL/min Final  . GFR calc Af Amer 10/10/2016 >60  >60 mL/min Final   Comment: (NOTE) The eGFR has been calculated using the CKD EPI equation. This calculation has not been validated in all clinical situations. eGFR's persistently <60 mL/min signify possible Chronic Kidney Disease.   . Anion gap 10/10/2016 7  5 - 15 Final  . WBC 10/10/2016 4.5  3.6 - 11.0 K/uL Final  . RBC 10/10/2016 4.41  3.80 - 5.20 MIL/uL Final  . Hemoglobin 10/10/2016 11.6* 12.0 - 16.0 g/dL Final  . HCT 10/10/2016 35.1  35.0 - 47.0 % Final  . MCV 10/10/2016 79.5* 80.0 - 100.0 fL Final  . MCH  10/10/2016 26.3  26.0 - 34.0 pg Final  . MCHC 10/10/2016 33.1  32.0 - 36.0 g/dL Final  . RDW 10/10/2016 14.4  11.5 - 14.5 % Final  . Platelets 10/10/2016 211  150 - 440 K/uL Final  . Troponin I 10/10/2016 <0.03  <0.03 ng/mL Final  . Color, Urine 10/10/2016 YELLOW* YELLOW Final  . APPearance 10/10/2016 CLEAR* CLEAR Final  . Glucose, UA 10/10/2016 NEGATIVE  NEGATIVE mg/dL Final  . Bilirubin Urine 10/10/2016 NEGATIVE  NEGATIVE Final  . Ketones, ur 10/10/2016 NEGATIVE  NEGATIVE mg/dL Final  . Specific Gravity, Urine 10/10/2016 1.016  1.005 - 1.030 Final  . Hgb urine dipstick 10/10/2016 1+* NEGATIVE Final  . pH 10/10/2016 5.0  5.0 -  8.0 Final  . Protein, ur 10/10/2016 NEGATIVE  NEGATIVE mg/dL Final  . Nitrite 10/10/2016 NEGATIVE  NEGATIVE Final  . Leukocytes, UA 10/10/2016 TRACE* NEGATIVE Final  . RBC / HPF 10/10/2016 0-5  0 - 5 RBC/hpf Final  . WBC, UA 10/10/2016 0-5  0 - 5 WBC/hpf Final  . Bacteria, UA 10/10/2016 RARE* NONE SEEN Final  . Squamous Epithelial / LPF 10/10/2016 6-30* NONE SEEN Final  . Mucous 10/10/2016 PRESENT   Final  . Preg Test, Ur 10/10/2016 NEGATIVE  NEGATIVE Final  . Preg Test, Ur 10/11/2016 NEGATIVE  NEGATIVE Final   Comment:        THE SENSITIVITY OF THIS METHODOLOGY IS >24 mIU/mL     Assessment:  Jasmine Gordon is a 30 y.o. female with iron deficiency anemia likely due to heavy menses.  Menses has improved in the past 2-3 months after IUD placement.  Diet appears good.  She denies any melena, hematochezia, or hematuria.  Work-up on 10/03/2016 revealed a hematocrit 34.2, hemoglobin 11.1, MCV 79.5, platelets 196,000, white count 4700 with an ANC of 2800.  Creatinine was 0.81.  Liver function tests were normal.  Ferritin was 12.  Iron studies included a saturation of 12% and TIBC of 340.  Urinalysis revealed no RBCs on 08/25/2016.  She has been on oral iron x 2 1/2 months.  She is taking oral iron "sometimes" secondary to tolerance.  Symptomatically, she is  fatigued secondary to work.  Exam is unremarkable.  Plan: 1.  Discuss workup and diagnosis of iron deficiency anemia.  She has a mild microcytic anemia likely due to heavy menses.  Discuss continued iron rich foods and oral iron as tolerated rather than IV iron secondary to only mildly low hematocrit.  Encourage patient to turn in guaiac cards. 2.  No IV iron today 3.  Patient to turn in guaiac cards 4.  RTC in 3 months for labs (CBC, ferritin) 5.  RTC in 6 months for MD assessment and labs (CBC with diff and ferritin) unless patient has made appt with new PCP.   Lequita Asal, MD  10/21/2016, 3:54 PM

## 2016-11-08 ENCOUNTER — Encounter: Payer: Self-pay | Admitting: Family Medicine

## 2016-11-08 ENCOUNTER — Ambulatory Visit (INDEPENDENT_AMBULATORY_CARE_PROVIDER_SITE_OTHER): Payer: BLUE CROSS/BLUE SHIELD | Admitting: Family Medicine

## 2016-11-08 VITALS — BP 127/76 | HR 99 | Temp 98.5°F | Resp 16 | Ht 67.0 in | Wt 247.0 lb

## 2016-11-08 DIAGNOSIS — E559 Vitamin D deficiency, unspecified: Secondary | ICD-10-CM | POA: Diagnosis not present

## 2016-11-08 DIAGNOSIS — D509 Iron deficiency anemia, unspecified: Secondary | ICD-10-CM

## 2016-11-08 DIAGNOSIS — Z131 Encounter for screening for diabetes mellitus: Secondary | ICD-10-CM

## 2016-11-08 DIAGNOSIS — D500 Iron deficiency anemia secondary to blood loss (chronic): Secondary | ICD-10-CM

## 2016-11-08 DIAGNOSIS — Z6838 Body mass index (BMI) 38.0-38.9, adult: Secondary | ICD-10-CM

## 2016-11-08 DIAGNOSIS — E668 Other obesity: Secondary | ICD-10-CM

## 2016-11-08 DIAGNOSIS — Z113 Encounter for screening for infections with a predominantly sexual mode of transmission: Secondary | ICD-10-CM

## 2016-11-08 DIAGNOSIS — R7309 Other abnormal glucose: Secondary | ICD-10-CM

## 2016-11-08 MED ORDER — NALTREXONE-BUPROPION HCL ER 8-90 MG PO TB12: 2.0000 | ORAL_TABLET | Freq: Two times a day (BID) | ORAL | 5 refills | Status: DC

## 2016-11-08 NOTE — Assessment & Plan Note (Signed)
Stable to improved over past 2 months, Hgb 11.6 last check, followed by Hematology, on oral iron supplement daily, now main improvement with regular menses (resolved menorrhagia) on paraguard IUD - Hold on checking iron studies or CBC again, next follow-up labs per Heme 6 months

## 2016-11-08 NOTE — Patient Instructions (Signed)
Thank you for coming in to clinic today.  1. Refilled Contrave for total 6 months. As discussed, since you did well on it initially, possibly the change now is related to more lifestyle with your limited exercise with work schedule. - Go ahead and take it just 1 pill twice a day for 1-2 weeks, then stop for 1-2 weeks, and resume at increasing doses again, start with 1 pill daily for 1 week, then 1 pill twice a day for 1 week, then 2 pills in morning and 1 in evening for 1 week, and then lastly full dose 2 pills twice a day  Referral placed for [facility] - they will call you with appointment Address: [address], [place], [place] [address] Phone: [phone]  2. Check blood work today for routine STD screening as discussed HIV RPR, you will need to get other STD testing Gonorrhea / Chlamydia testing through swab test from GYN office with your upcoming pelvic exam pap smear  3. Other blood testing with A1c diabetes screening, and Vitamin D  - If vitamin D is still LOW, then we will have you start Ove the Counter Vitamin D3 supplement 5,000 units DAILY (not the 50,000 weekly), IF your level is NORMAL >30+ then we will have you do the daily maintenance dose of Vitamin D3 OTC 2,000 units DAILY  Follow-up as planned with Hematology  Please schedule a follow-up appointment with Dr. Althea CharonKaramalegos in 6 months for Weight Follow-up / Also may schedule with new Nurse Practictioner in future if prefer (they will start in 2018), since you were previously followed by Bjorn PippinAmy Krebs FNP  If you have any other questions or concerns, please feel free to call the clinic or send a message through MyChart. You may also schedule an earlier appointment if necessary.  Saralyn PilarAlexander Karamalegos, DO [facility]

## 2016-11-08 NOTE — Assessment & Plan Note (Signed)
Last checked 17 (05/2016), s/p vitamin D3 50,000 weekly for 12 weeks (did not tolerate with headache) Check Vitamin D today, if still low, will start OTC Vitamin D3 5,000 iu DAILY, if normal will do OTC 2,000iu daily maintenance

## 2016-11-08 NOTE — Assessment & Plan Note (Addendum)
Recent weight gain +5 lbs, overall +7 from her lowest after starting contrave, ultimately still down after start in 06/2016, tolerating well on max dose, also improves her mood. Recent worsening lifestyle modifications (less regular exercise with work schedule change) - Prior labs reviewed TSH normal 05/2016, never had A1c (has abnormal glucose)  Plan: 1. Check A1c DM screening with abnormal glucose and father with DM 2. Check Vitamin D 3. Refilled Contrave - counseling may wean off over 1-2 weeks, then wait until lifestyle changes with new schedule and exercise regimen, and titrate back up again from starting dose once daily up by 1 per 1 week until back to 2 pills BID max dose 4. Referral to [facility], nutritionist 5. Follow-up 6 months weight management, future consider Weight Management Clinic [facility] if needed

## 2016-11-08 NOTE — Progress Notes (Signed)
Subjective:    Patient ID: Jasmine Gordon, female    DOB: 03/16/1986, 30 y.o.   MRN: [id number]  Jasmine Gordon is a 30 y.o. female presenting on 11/08/2016 for Follow-up   HPI   Obesity BMI >38 / Weight Loss Management / Screening DM - Reports chronic history of overweight, she has been managed with variety of weight loss strategies, recently started on Contrave from prior PCP. Started in 06/2016, titrated up from once daily up to 2 pills BID after 1 month, tolerated well initially, started at wt 258 lbs, then down to 240 over 1-2 months. Also with lifestyle modifications, improved exercise. - Today reports some weight gain over past few weeks, attributes to stress with work and less time for exercise (wants to exercise at gym, at night), but with school as well, has not got a good routine back with work schedule change, she recently stopped the medicine with her sinusitis and on antibiotic - Recent weight up 240 to 247lbs 1-2 months - Admits contrave helps her mood as well, with history of PTSD - Asking if she needs to stop temporarily and then restart - Previously saw weight loss physician and nutritionist (through GYN office) >1-2 years ago - Interested in [facility] - Requesting Diabetes screening A1c - Family history of Diabetes Type 2 father, and maternal grandfather  Vitamin D Deficiency - Last checked Vit D 17 in 06/17/16, was treated with Vitamin D3 50,000 units weekly for 12 weeks, did not tolerate 50k dosing too well, had some headaches associated with this. - Due for next Vitamin D re-check  Acute Sinusitis: - Last seen 11/04/16, 4 days ago for this new problem, diagnosed with frontal sinusitis with recent URI symptoms >6 days now, treated with Amoxil high dose x 10 days, and today reports doing better on this. - Denies fevers/chills, sinus purulence, productive cough  Iron Deficiency Anemia, Microcytic, secondary to chronic blood loss with h/o  menorrhagia - Followed by Hematology Dr Merlene Pullingorcoran, last seen 09/2016 and 10/2016, followed with blood work, and initially was treated with Iron oral supplement x 2 daily but had GI intolerance, switched to once daily and doing well currently. Next follow-up 6 months, does not think she will need IV iron therapy - Denies fatigue, dyspnea, chest pain, dizziness/lightheadedness, syncope  Sexual History / STD Screening / GYN - [facility], next apt 10/2016, for annual including pap smears - Currently has Paraguard IUD (since 12/2015), now regular menstrual cycles without menorrhagia or heavy menses, previously had Mirena IUD 2014-2016, did not tolerate well due to affecting her mood, had Depo-Provera in interval - Today requests STD screening, complete check. Last negative HIV, RPR 02/2016, also had GC/Chlamydia testing 02/2016 also negative. Denies any prior history of STD. No known exposure to STD. Curren sexual history with married, one female partner. - She declines pelvic exam today as she is going to routine GYN exam later this month 10/2016, and will ask them for GC/Chlamydia testing - Denies any active symptoms, vaginal discharge, dysuria, pelvic pain, fevers/chills  PMH - Anxiety, PTSD   Social History  Substance Use Topics  . Smoking status: Never Smoker  . Smokeless tobacco: Never Used  . Alcohol use No     Comment: occ    Review of Systems Per HPI unless specifically indicated above     Objective:    BP 127/76   Pulse 99   Temp 98.5 F (36.9 C) (Oral)   Resp 16  Ht 5\' 7"  (1.702 m)   Wt 247 lb (112 kg)   LMP 10/30/2016   BMI 38.69 kg/m   Wt Readings from Last 3 Encounters:  11/08/16 247 lb (112 kg)  10/21/16 246 lb 4.1 oz (111.7 kg)  10/10/16 240 lb (108.9 kg)    Physical Exam  Constitutional: She appears well-developed and well-nourished. No distress.  Well-appearing, comfortable, cooperative, obese  HENT:  Head: Normocephalic and atraumatic.    Mouth/Throat: Oropharynx is clear and moist.  Neck: Normal range of motion. Neck supple. No thyromegaly present.  Cardiovascular: Intact distal pulses.   Mild tachycardia  Pulmonary/Chest: Effort normal.  Neurological: She is alert.  Skin: Skin is warm and dry. She is not diaphoretic.  Psychiatric: She has a normal mood and affect. Her behavior is normal. Thought content normal.  Nursing note and vitals reviewed.    I have personally reviewed the following lab results from 09/2016.  Results for orders placed or performed during the hospital encounter of 10/10/16  Basic metabolic panel  Result Value Ref Range   Sodium 138 135 - 145 mmol/L   Potassium 3.4 (L) 3.5 - 5.1 mmol/L   Chloride 108 101 - 111 mmol/L   CO2 23 22 - 32 mmol/L   Glucose, Bld 95 65 - 99 mg/dL   BUN 10 6 - 20 mg/dL   Creatinine, Ser 1.610.75 0.44 - 1.00 mg/dL   Calcium 8.9 8.9 - 09.610.3 mg/dL   GFR calc non Af Amer >60 >60 mL/min   GFR calc Af Amer >60 >60 mL/min   Anion gap 7 5 - 15  CBC  Result Value Ref Range   WBC 4.5 3.6 - 11.0 K/uL   RBC 4.41 3.80 - 5.20 MIL/uL   Hemoglobin 11.6 (L) 12.0 - 16.0 g/dL   HCT 04.535.1 40.935.0 - 81.147.0 %   MCV 79.5 (L) 80.0 - 100.0 fL   MCH 26.3 26.0 - 34.0 pg   MCHC 33.1 32.0 - 36.0 g/dL   RDW 91.414.4 78.211.5 - 95.614.5 %   Platelets 211 150 - 440 K/uL  Troponin I  Result Value Ref Range   Troponin I <0.03 <0.03 ng/mL  Urinalysis complete, with microscopic ([facility] only)  Result Value Ref Range   Color, Urine YELLOW (A) YELLOW   APPearance CLEAR (A) CLEAR   Glucose, UA NEGATIVE NEGATIVE mg/dL   Bilirubin Urine NEGATIVE NEGATIVE   Ketones, ur NEGATIVE NEGATIVE mg/dL   Specific Gravity, Urine 1.016 1.005 - 1.030   Hgb urine dipstick 1+ (A) NEGATIVE   pH 5.0 5.0 - 8.0   Protein, ur NEGATIVE NEGATIVE mg/dL   Nitrite NEGATIVE NEGATIVE   Leukocytes, UA TRACE (A) NEGATIVE   RBC / HPF 0-5 0 - 5 RBC/hpf   WBC, UA 0-5 0 - 5 WBC/hpf   Bacteria, UA RARE (A) NONE SEEN   Squamous Epithelial / LPF  6-30 (A) NONE SEEN   Mucous PRESENT   Pregnancy, urine  Result Value Ref Range   Preg Test, Ur NEGATIVE NEGATIVE  Pregnancy, urine POC  Result Value Ref Range   Preg Test, Ur NEGATIVE NEGATIVE      Assessment & Plan:   Problem List Items Addressed This Visit    Vitamin D deficiency    Last checked 17 (05/2016), s/p vitamin D3 50,000 weekly for 12 weeks (did not tolerate with headache) Check Vitamin D today, if still low, will start OTC Vitamin D3 5,000 iu DAILY, if normal will do OTC 2,000iu daily maintenance  Relevant Orders   VITAMIN D 25 Hydroxy (Vit-D Deficiency, Fractures)   Microcytic anemia    Improved on iron supplement Follow-up per Hematology      Iron deficiency anemia due to chronic blood loss    Stable to improved over past 2 months, Hgb 11.6 last check, followed by Hematology, on oral iron supplement daily, now main improvement with regular menses (resolved menorrhagia) on paraguard IUD - Hold on checking iron studies or CBC again, next follow-up labs per Heme 6 months      BMI 38.0-38.9,adult - Primary    Recent weight gain +5 lbs, overall +7 from her lowest after starting contrave, ultimately still down after start in 06/2016, tolerating well on max dose, also improves her mood. Recent worsening lifestyle modifications (less regular exercise with work schedule change) - Prior labs reviewed TSH normal 05/2016, never had A1c (has abnormal glucose)  Plan: 1. Check A1c DM screening with abnormal glucose and father with DM 2. Check Vitamin D 3. Refilled Contrave - counseling may wean off over 1-2 weeks, then wait until lifestyle changes with new schedule and exercise regimen, and titrate back up again from starting dose once daily up by 1 per 1 week until back to 2 pills BID max dose 4. Referral to [facility], nutritionist 5. Follow-up 6 months weight management, future consider Weight Management Clinic through [facility] if needed      Relevant Medications     Naltrexone-Bupropion HCl ER (CONTRAVE) 8-90 MG TB12   Other Relevant Orders   Hemoglobin A1c   Ambulatory referral to Nutrition and Diabetic Education    Other Visit Diagnoses    Screen for STD (sexually transmitted disease)       Patient is low risk for STD, but requests screening, last negative 02/2016, will do serum test HIV / RPR today, defer GC/Chlamydia to GYN when has pap 10/2016   Relevant Orders   HIV antibody   RPR   Screening for diabetes mellitus       Screen with A1c, father has history of DM and patient has abnormal elevated glucose recently, with obesity BMI >38   Relevant Orders   Hemoglobin A1c   Abnormal glucose       Screen with A1c, father has history of DM and patient has abnormal elevated glucose recently, with obesity BMI >38      Meds ordered this encounter  Medications         . DISCONTD: fluticasone (FLOVENT HFA) 220 MCG/ACT inhaler  . Naltrexone-Bupropion HCl ER (CONTRAVE) 8-90 MG TB12    Sig: Take 2 tablets by mouth 2 (two) times daily.    Dispense:  120 tablet    Refill:  5      Follow up plan: Return in about 6 months (around 05/09/2017) for weight management.  [name], DO [facility] 11/08/2016, 1:27 PM

## 2016-11-08 NOTE — Assessment & Plan Note (Signed)
Improved on iron supplement Follow-up per Hematology

## 2016-11-09 LAB — HEMOGLOBIN A1C
Hgb A1c MFr Bld: 5.6 % (ref <5.7)
Mean Plasma Glucose: 114 mg/dL

## 2016-11-09 LAB — VITAMIN D 25 HYDROXY (VIT D DEFICIENCY, FRACTURES): Vit D, 25-Hydroxy: 15 ng/mL — ABNORMAL LOW (ref 30–100)

## 2016-11-09 LAB — RPR

## 2016-11-09 LAB — HIV ANTIBODY (ROUTINE TESTING W REFLEX): HIV 1&2 Ab, 4th Generation: NONREACTIVE

## 2016-11-11 ENCOUNTER — Encounter: Payer: Self-pay | Admitting: Family Medicine

## 2016-11-11 DIAGNOSIS — J4530 Mild persistent asthma, uncomplicated: Secondary | ICD-10-CM

## 2016-11-11 MED ORDER — ALBUTEROL SULFATE (2.5 MG/3ML) 0.083% IN NEBU: 2.5000 mg | INHALATION_SOLUTION | Freq: Four times a day (QID) | RESPIRATORY_TRACT | 2 refills | Status: DC | PRN

## 2016-11-11 NOTE — Addendum Note (Signed)
Addended by: Smitty CordsKARAMALEGOS, ALEXANDER J on: 11/11/2016 11:56 AM   Modules accepted: Orders

## 2016-11-11 NOTE — Telephone Encounter (Signed)
Nebulizer machine ordered through [facility], message sent, await follow-up. Dx indication mild persistent asthma J45.30.  Advised patient follow-up in future discuss further may need adjust home maintenance asthma meds, consider alternative diagnosis.

## 2016-11-12 ENCOUNTER — Emergency Department
Admission: EM | Admit: 2016-11-12 | Discharge: 2016-11-12 | Disposition: A | Discharge status: Home / Self Care | Payer: BLUE CROSS/BLUE SHIELD | Attending: Student in an Organized Health Care Education/Training Program | Admitting: Student in an Organized Health Care Education/Training Program

## 2016-11-12 DIAGNOSIS — J45909 Unspecified asthma, uncomplicated: Secondary | ICD-10-CM | POA: Diagnosis not present

## 2016-11-12 DIAGNOSIS — Z79899 Other long term (current) drug therapy: Secondary | ICD-10-CM | POA: Insufficient documentation

## 2016-11-12 DIAGNOSIS — E860 Dehydration: Secondary | ICD-10-CM | POA: Diagnosis not present

## 2016-11-12 DIAGNOSIS — R1084 Generalized abdominal pain: Secondary | ICD-10-CM | POA: Diagnosis not present

## 2016-11-12 DIAGNOSIS — R197 Diarrhea, unspecified: Secondary | ICD-10-CM | POA: Diagnosis present

## 2016-11-12 LAB — COMPREHENSIVE METABOLIC PANEL
ALT: 14 U/L (ref 14–54)
AST: 18 U/L (ref 15–41)
Albumin: 4.3 g/dL (ref 3.5–5.0)
Alkaline Phosphatase: 51 U/L (ref 38–126)
Anion gap: 7 (ref 5–15)
BUN: 15 mg/dL (ref 6–20)
CO2: 22 mmol/L (ref 22–32)
Calcium: 9 mg/dL (ref 8.9–10.3)
Chloride: 108 mmol/L (ref 101–111)
Creatinine, Ser: 0.75 mg/dL (ref 0.44–1.00)
GFR calc Af Amer: >60 mL/min (ref >60)
GFR calc non Af Amer: >60 mL/min (ref >60)
Glucose, Bld: 97 mg/dL (ref 65–99)
Potassium: 3.9 mmol/L (ref 3.5–5.1)
Sodium: 137 mmol/L (ref 135–145)
Total Bilirubin: 0.2 mg/dL — ABNORMAL LOW (ref 0.3–1.2)
Total Protein: 7.5 g/dL (ref 6.5–8.1)

## 2016-11-12 LAB — LIPASE, BLOOD: Lipase: 17 U/L (ref 11–51)

## 2016-11-12 LAB — CBC
HCT: 37.7 % (ref 35.0–47.0)
Hemoglobin: 12.3 g/dL (ref 12.0–16.0)
MCH: 26.1 pg (ref 26.0–34.0)
MCHC: 32.7 g/dL (ref 32.0–36.0)
MCV: 80 fL (ref 80.0–100.0)
Platelets: 215 10*3/uL (ref 150–440)
RBC: 4.72 MIL/uL (ref 3.80–5.20)
RDW: 14.3 % (ref 11.5–14.5)
WBC: 5.3 10*3/uL (ref 3.6–11.0)

## 2016-11-12 LAB — URINALYSIS, COMPLETE (UACMP) WITH MICROSCOPIC
Bacteria, UA: NONE SEEN
Bilirubin Urine: NEGATIVE
Glucose, UA: NEGATIVE mg/dL
Hgb urine dipstick: NEGATIVE
Ketones, ur: NEGATIVE mg/dL
Leukocytes, UA: NEGATIVE
Nitrite: NEGATIVE
Protein, ur: NEGATIVE mg/dL
Specific Gravity, Urine: 1.027 (ref 1.005–1.030)
pH: 5 (ref 5.0–8.0)

## 2016-11-12 MED ORDER — SODIUM CHLORIDE 0.9 % IV BOLUS (SEPSIS)
1000.0000 mL | Freq: Once | INTRAVENOUS | Status: AC
  Administered 2016-11-12: 1000 mL via INTRAVENOUS

## 2016-11-12 MED ORDER — PROMETHAZINE HCL 12.5 MG PO TABS: 12.5000 mg | ORAL_TABLET | Freq: Four times a day (QID) | ORAL | 0 refills | Status: DC | PRN

## 2016-11-12 MED ORDER — DICYCLOMINE HCL 10 MG PO CAPS
10.0000 mg | ORAL_CAPSULE | Freq: Once | ORAL | Status: AC
  Administered 2016-11-12: 10 mg via ORAL
  Filled 2016-11-12: qty 1

## 2016-11-12 MED ORDER — PROMETHAZINE HCL 25 MG/ML IJ SOLN
12.5000 mg | Freq: Once | INTRAMUSCULAR | Status: AC
  Administered 2016-11-12: 12.5 mg via INTRAVENOUS
  Filled 2016-11-12: qty 1

## 2016-11-12 MED ORDER — DICYCLOMINE HCL 10 MG PO CAPS: 10.0000 mg | ORAL_CAPSULE | Freq: Three times a day (TID) | ORAL | 0 refills | Status: DC | PRN

## 2016-11-12 NOTE — ED Triage Notes (Addendum)
Pt reports to ED w/ c/o diarrhea and n/v, began today.  Pt sts that she started amoxicillin last Friday, has been taking as prescribed.  Pt alert and oriented, resp even and unlabored. Pt reports +PO intake.  Pt c/o abd pain. Skin WNL.

## 2016-11-12 NOTE — ED Provider Notes (Signed)
[facility] Emergency Department Provider Note    First MD Initiated Contact with Patient 11/12/16 1159     (approximate)  I have reviewed the triage vital signs and the nursing notes.   HISTORY  Chief Complaint Diarrhea and Emesis    HPI Jasmine Gordon is a 30 y.o. female  a crampy abdominal pain that started early this morning awoke her from sleep associated with nausea and 4 episodes of loose watery diarrhea. She did try to vomit one time to see if that improved her symptoms. There is no bilious nonbloody vomit. States that she was recently started on amoxicillin for sinusitis. States that she does have several family members with similar illness. Denies any fevers or chills.   Past Medical History:  Diagnosis Date  . Allergy   . Anemia   . Anxiety   . Asthma   . Bronchiolitis   . Depression   . Migraine   . Panic attack   . PTSD (post-traumatic stress disorder) 2016   Family History  Problem Relation Age of Onset  . Depression Mother   . Mental retardation Mother   . Bipolar disorder Mother   . Diabetes Father   . Stroke Maternal Grandfather   . Diabetes Maternal Grandfather    Past Surgical History:  Procedure Laterality Date  . APPENDECTOMY    . Plantar warts     Patient Active Problem List   Diagnosis Date Noted  . Vitamin D deficiency 11/08/2016  . Iron deficiency anemia due to chronic blood loss 10/03/2016  . Microcytic anemia 07/15/2016  . Menorrhagia with regular cycle 06/27/2016  . Mild persistent asthma 06/17/2016  . Anxiety and depression 06/17/2016  . BMI 38.0-38.9,adult 06/17/2016      Prior to Admission medications   Medication Sig Start Date End Date Taking? Authorizing Provider  albuterol (PROVENTIL HFA;VENTOLIN HFA) 108 (90 Base) MCG/ACT inhaler Inhale 2 puffs into the lungs every 6 (six) hours as needed for wheezing or shortness of breath. 06/17/16   Amy Rusty AusLauren Krebs, NP  albuterol (PROVENTIL) (2.5  MG/3ML) 0.083% nebulizer solution Take 3 mLs (2.5 mg total) by nebulization every 6 (six) hours as needed for wheezing or shortness of breath. 11/11/16   Smitty CordsAlexander J Karamalegos, DO  amoxicillin (AMOXIL) 875 MG tablet Take by mouth. 11/04/16 11/14/16  Historical Provider, MD  dicyclomine (BENTYL) 10 MG capsule Take 1 capsule (10 mg total) by mouth 3 (three) times daily as needed for spasms. 11/12/16 11/26/16  Willy EddyPatrick Yossef Gilkison, MD  FLOVENT HFA 220 MCG/ACT inhaler  06/22/16   Historical Provider, MD  Naltrexone-Bupropion HCl ER (CONTRAVE) 8-90 MG TB12 Take 2 tablets by mouth 2 (two) times daily. 11/08/16   Smitty CordsAlexander J Karamalegos, DO  promethazine (PHENERGAN) 12.5 MG tablet Take 1 tablet (12.5 mg total) by mouth every 6 (six) hours as needed for nausea or vomiting. 11/12/16   Willy EddyPatrick Jahmani Staup, MD    Allergies Asa [aspirin]; Reglan [metoclopramide]; and Turmeric    Social History Social History  Substance Use Topics  . Smoking status: Never Smoker  . Smokeless tobacco: Never Used  . Alcohol use No     Comment: occ    Review of Systems Patient denies headaches, rhinorrhea, blurry vision, numbness, shortness of breath, chest pain, edema, cough, abdominal pain, nausea, vomiting, diarrhea, dysuria, fevers, rashes or hallucinations unless otherwise stated above in HPI. ____________________________________________   PHYSICAL EXAM:  VITAL SIGNS: Vitals:   11/12/16 1142 11/12/16 1300  BP: (!) 114/56 128/80  Pulse: Marland Kitchen(!)  103 (!) 103  Resp: 18   Temp: 97.9 F (36.6 C)     Constitutional: Alert and oriented. Well appearing and in no acute distress. Eyes: Conjunctivae are normal. PERRL. EOMI. Head: Atraumatic. Nose: No congestion/rhinnorhea. Mouth/Throat: Mucous membranes are moist.  Oropharynx non-erythematous. Neck: No stridor. Painless ROM. No cervical spine tenderness to palpation Hematological/Lymphatic/Immunilogical: No cervical lymphadenopathy. Cardiovascular: Normal rate, regular  rhythm. Grossly normal heart sounds.  Good peripheral circulation. Respiratory: Normal respiratory effort.  No retractions. Lungs CTAB. Gastrointestinal: obese Soft and nontender. No distention. No abdominal bruits. No CVA tenderness. Musculoskeletal: No lower extremity tenderness nor edema.  No joint effusions. Neurologic:  Normal speech and language. No gross focal neurologic deficits are appreciated. No gait instability. Skin:  Skin is warm, dry and intact. No rash noted. Psychiatric: Mood and affect are normal. Speech and behavior are normal.  ____________________________________________   LABS (all labs ordered are listed, but only abnormal results are displayed)  Results for orders placed or performed during the hospital encounter of 11/12/16 (from the past 24 hour(s))  Lipase, blood     Status: None   Collection Time: 11/12/16 11:44 AM  Result Value Ref Range   Lipase 17 11 - 51 U/L  Comprehensive metabolic panel     Status: Abnormal   Collection Time: 11/12/16 11:44 AM  Result Value Ref Range   Sodium 137 135 - 145 mmol/L   Potassium 3.9 3.5 - 5.1 mmol/L   Chloride 108 101 - 111 mmol/L   CO2 22 22 - 32 mmol/L   Glucose, Bld 97 65 - 99 mg/dL   BUN 15 6 - 20 mg/dL   Creatinine, Ser 1.610.75 0.44 - 1.00 mg/dL   Calcium 9.0 8.9 - 09.610.3 mg/dL   Total Protein 7.5 6.5 - 8.1 g/dL   Albumin 4.3 3.5 - 5.0 g/dL   AST 18 15 - 41 U/L   ALT 14 14 - 54 U/L   Alkaline Phosphatase 51 38 - 126 U/L   Total Bilirubin 0.2 (L) 0.3 - 1.2 mg/dL   GFR calc non Af Amer >60 >60 mL/min   GFR calc Af Amer >60 >60 mL/min   Anion gap 7 5 - 15  CBC     Status: None   Collection Time: 11/12/16 11:44 AM  Result Value Ref Range   WBC 5.3 3.6 - 11.0 K/uL   RBC 4.72 3.80 - 5.20 MIL/uL   Hemoglobin 12.3 12.0 - 16.0 g/dL   HCT 04.537.7 40.935.0 - 81.147.0 %   MCV 80.0 80.0 - 100.0 fL   MCH 26.1 26.0 - 34.0 pg   MCHC 32.7 32.0 - 36.0 g/dL   RDW 91.414.3 78.211.5 - 95.614.5 %   Platelets 215 150 - 440 K/uL  Urinalysis, Complete  w Microscopic     Status: Abnormal   Collection Time: 11/12/16 12:50 PM  Result Value Ref Range   Color, Urine YELLOW (A) YELLOW   APPearance CLEAR (A) CLEAR   Specific Gravity, Urine 1.027 1.005 - 1.030   pH 5.0 5.0 - 8.0   Glucose, UA NEGATIVE NEGATIVE mg/dL   Hgb urine dipstick NEGATIVE NEGATIVE   Bilirubin Urine NEGATIVE NEGATIVE   Ketones, ur NEGATIVE NEGATIVE mg/dL   Protein, ur NEGATIVE NEGATIVE mg/dL   Nitrite NEGATIVE NEGATIVE   Leukocytes, UA NEGATIVE NEGATIVE   RBC / HPF 0-5 0 - 5 RBC/hpf   WBC, UA 0-5 0 - 5 WBC/hpf   Bacteria, UA NONE SEEN NONE SEEN   Squamous Epithelial / LPF  0-5 (A) NONE SEEN   Mucous PRESENT    ____________________________________________ ____________________________________________  RADIOLOGY   ____________________________________________   PROCEDURES  Procedure(s) performed:  Procedures    Critical Care performed: no ____________________________________________   INITIAL IMPRESSION / ASSESSMENT AND PLAN / ED COURSE  Pertinent labs & imaging results that were available during my care of the patient were reviewed by me and considered in my medical decision making (see chart for details).  DDX: age, flu, viral enteritis, food poisoning  Jasmine Gordon is a 30 y.o. who presents to the ED with  crampy abdominal pain associated with nausea and diarrhea. Patient mildly tachycardic but otherwise normotensive and in no acute distress. She does appear well perfused. Based on those several episodes of diarrhea will provide IV fluids for component of dehydration. We'll check labs to ensure that she does not have any evidence of metabolic derangement. Based on her abdominal exam and symptoms that she is describing a do not feel that CT imaging clinically indicated.  Clinical Course as of Nov 13 2031  Sat Nov 12, 2016  1319 Blood work is reassuring.  Patient was able to tolerate PO and was able to ambulate with a steady gait.  Have  discussed with the patient and available family all diagnostics and treatments performed thus far and all questions were answered to the best of my ability. The patient demonstrates understanding and agreement with plan.   [PR]    Clinical Course User Index [PR] Willy Eddy, MD     ____________________________________________   FINAL CLINICAL IMPRESSION(S) / ED DIAGNOSES  Final diagnoses:  Dehydration  Generalized abdominal pain      NEW MEDICATIONS STARTED DURING THIS VISIT:  Discharge Medication List as of 11/12/2016  1:20 PM    START taking these medications   Details  dicyclomine (BENTYL) 10 MG capsule Take 1 capsule (10 mg total) by mouth 3 (three) times daily as needed for spasms., Starting Sat 11/12/2016, Until Sat 11/26/2016, Print    promethazine (PHENERGAN) 12.5 MG tablet Take 1 tablet (12.5 mg total) by mouth every 6 (six) hours as needed for nausea or vomiting., Starting Sat 11/12/2016, Print         Note:  This document was prepared using Dragon voice recognition software and may include unintentional dictation errors.    Willy Eddy, MD 11/12/16 2032

## 2016-11-14 ENCOUNTER — Encounter: Payer: Self-pay | Admitting: Emergency Medicine

## 2016-11-14 ENCOUNTER — Emergency Department
Admission: EM | Admit: 2016-11-14 | Discharge: 2016-11-14 | Disposition: A | Discharge status: Home / Self Care | Payer: BLUE CROSS/BLUE SHIELD | Attending: Emergency Medicine | Admitting: Emergency Medicine

## 2016-11-14 ENCOUNTER — Emergency Department: Payer: BLUE CROSS/BLUE SHIELD

## 2016-11-14 DIAGNOSIS — Z79899 Other long term (current) drug therapy: Secondary | ICD-10-CM | POA: Insufficient documentation

## 2016-11-14 DIAGNOSIS — J40 Bronchitis, not specified as acute or chronic: Secondary | ICD-10-CM | POA: Diagnosis not present

## 2016-11-14 DIAGNOSIS — R0602 Shortness of breath: Secondary | ICD-10-CM | POA: Diagnosis present

## 2016-11-14 MED ORDER — IPRATROPIUM-ALBUTEROL 0.5-2.5 (3) MG/3ML IN SOLN
3.0000 mL | Freq: Once | RESPIRATORY_TRACT | Status: AC
  Administered 2016-11-14: 3 mL via RESPIRATORY_TRACT
  Filled 2016-11-14: qty 3

## 2016-11-14 NOTE — ED Provider Notes (Signed)
[facility] Emergency Department Provider Note    ____________________________________________   I have reviewed the triage vital signs and the nursing notes.   HISTORY  Chief Complaint Shortness of Breath   History limited by: Not Limited   HPI Jasmine Gordon is a 30 y.o. female who presents to the emergency department today because of concerns for shortness of breath. The patient states that she has not been feeling well for roughly 1 week. She was seen in the emergency department 2 days ago. It all started with what was described as a sinus infection. She was put on amoxicillin by primary care. 2 days ago she came to the emergency department with abdominal pain. She was diagnosed with a viral gastroenteritis. She states she feels like her stomach is getting better however this morning when she woke up she started having some shortness of breath. She has had some slight chest discomfort. She denies a history of asthma although states that her primary care doctor has given her inhalers as well as ordered a nebulized machine for her. She tried taking the inhalers without any significant relief. She denies any fevers.   Past Medical History:  Diagnosis Date  . Allergy   . Anemia   . Anxiety   . Asthma   . Bronchiolitis   . Depression   . Migraine   . Panic attack   . PTSD (post-traumatic stress disorder) 2016    Patient Active Problem List   Diagnosis Date Noted  . Vitamin D deficiency 11/08/2016  . Iron deficiency anemia due to chronic blood loss 10/03/2016  . Microcytic anemia 07/15/2016  . Menorrhagia with regular cycle 06/27/2016  . Mild persistent asthma 06/17/2016  . Anxiety and depression 06/17/2016  . BMI 38.0-38.9,adult 06/17/2016    Past Surgical History:  Procedure Laterality Date  . APPENDECTOMY    . Plantar warts      Prior to Admission medications   Medication Sig Start Date End Date Taking? Authorizing Provider   albuterol (PROVENTIL HFA;VENTOLIN HFA) 108 (90 Base) MCG/ACT inhaler Inhale 2 puffs into the lungs every 6 (six) hours as needed for wheezing or shortness of breath. 06/17/16   Amy Rusty AusLauren Krebs, NP  albuterol (PROVENTIL) (2.5 MG/3ML) 0.083% nebulizer solution Take 3 mLs (2.5 mg total) by nebulization every 6 (six) hours as needed for wheezing or shortness of breath. 11/11/16   Smitty CordsAlexander J Karamalegos, DO  amoxicillin (AMOXIL) 875 MG tablet Take by mouth. 11/04/16 11/14/16  Historical Provider, MD  dicyclomine (BENTYL) 10 MG capsule Take 1 capsule (10 mg total) by mouth 3 (three) times daily as needed for spasms. 11/12/16 11/26/16  Willy EddyPatrick Robinson, MD  FLOVENT HFA 220 MCG/ACT inhaler  06/22/16   Historical Provider, MD  Naltrexone-Bupropion HCl ER (CONTRAVE) 8-90 MG TB12 Take 2 tablets by mouth 2 (two) times daily. 11/08/16   Smitty CordsAlexander J Karamalegos, DO  promethazine (PHENERGAN) 12.5 MG tablet Take 1 tablet (12.5 mg total) by mouth every 6 (six) hours as needed for nausea or vomiting. 11/12/16   Willy EddyPatrick Robinson, MD    Allergies Asa [aspirin]; Reglan [metoclopramide]; and Turmeric  Family History  Problem Relation Age of Onset  . Depression Mother   . Mental retardation Mother   . Bipolar disorder Mother   . Diabetes Father   . Stroke Maternal Grandfather   . Diabetes Maternal Grandfather     Social History Social History  Substance Use Topics  . Smoking status: Never Smoker  . Smokeless tobacco: Never Used  .  Alcohol use No     Comment: occ    Review of Systems  Constitutional: Negative for fever. Cardiovascular: Positive for chest pain. Respiratory: Positive for shortness of breath. Gastrointestinal: Negative for abdominal pain, vomiting and diarrhea. Neurological: Negative for headaches, focal weakness or numbness.  10-point ROS otherwise negative.  ____________________________________________   PHYSICAL EXAM:  VITAL SIGNS: ED Triage Vitals  Enc Vitals Group     BP  11/14/16 1931 130/78     Pulse Rate 11/14/16 1931 (!) 103     Resp 11/14/16 1931 18     Temp 11/14/16 1931 98.5 F (36.9 C)     Temp Source 11/14/16 1931 Oral     SpO2 11/14/16 1931 100 %     Weight 11/14/16 1932 243 lb (110.2 kg)     Height 11/14/16 1932 5\' 7"  (1.702 m)     Head Circumference --      Peak Flow --      Pain Score 11/14/16 1932 7     Pain Loc --      Pain Edu? --      Excl. in GC? --      Constitutional: Alert and oriented. Well appearing and in no distress. Eyes: Conjunctivae are normal. Normal extraocular movements. ENT   Head: Normocephalic and atraumatic.   Nose: No congestion/rhinnorhea.   Mouth/Throat: Mucous membranes are moist.   Neck: No stridor. Hematological/Lymphatic/Immunilogical: No cervical lymphadenopathy. Cardiovascular: Normal rate, regular rhythm.  No murmurs, rubs, or gallops.  Respiratory: Normal respiratory effort without tachypnea nor retractions. Breath sounds are clear and equal bilaterally. No wheezes/rales/rhonchi. Gastrointestinal: Soft and non tender. No rebound. No guarding.  Genitourinary: Deferred Musculoskeletal: Normal range of motion in all extremities. No lower extremity edema. Neurologic:  Normal speech and language. No gross focal neurologic deficits are appreciated.  Skin:  Skin is warm, dry and intact. No rash noted. Psychiatric: Mood and affect are normal. Speech and behavior are normal. Patient exhibits appropriate insight and judgment.  ____________________________________________    LABS (pertinent positives/negatives)  None  ____________________________________________   EKG  I, Phineas SemenGraydon Empress Newmann, attending physician, personally viewed and interpreted this EKG  EKG Time: 1938 Rate: 105 Rhythm: sinus tachycardia Axis: normal Intervals: qtc 467 QRS: narrow ST changes: no st elevation Impression: sinus tachycardia otherwise normal ekg  ____________________________________________     RADIOLOGY  CXR IMPRESSION:  No active cardiopulmonary disease.     ____________________________________________   PROCEDURES  Procedures  ____________________________________________   INITIAL IMPRESSION / ASSESSMENT AND PLAN / ED COURSE  Pertinent labs & imaging results that were available during my care of the patient were reviewed by me and considered in my medical decision making (see chart for details).  Patient presented to the emergency department today because of primary concern for some shortness of breath. Think at this point likely bronchitis. It does sound like patient is battling a viral illness. She was mildly tachycardic however not hypoxic and in no respiratory distress. She has not been coughing up any blood. No unilateral leg swelling. At this point I do not think that her symptoms are not consistent with PE. Felt better after duoneb. Will discharge home. Discussed return precautions.  ____________________________________________   FINAL CLINICAL IMPRESSION(S) / ED DIAGNOSES  Final diagnoses:  Bronchitis     Note: This dictation was prepared with Dragon dictation. Any transcriptional errors that result from this process are unintentional     Phineas SemenGraydon Peyton Spengler, MD 11/14/16 2021

## 2016-11-14 NOTE — Discharge Instructions (Signed)
Please seek medical attention for any high fevers, chest pain, shortness of breath, change in behavior, persistent vomiting, bloody stool or any other new or concerning symptoms.  

## 2016-11-14 NOTE — ED Triage Notes (Signed)
Pt ambulatory to triage with steady gait with c/o shortness of breath and chest tightness starting this morning. Pt reports increased shortness of breath with exertion, states "I was trying to eat and I kept getting short of breath." Pt alert and oriented x 4, respirations even and unlabored, skin warm and dry. Pt states used inhaler but states no relief.

## 2016-11-14 NOTE — ED Notes (Signed)
AAOx3.  Skin warm and dry. NAD.  No SOB/ DOE.  Skin warm and dry.

## 2016-11-17 ENCOUNTER — Encounter: Payer: Self-pay | Admitting: Family Medicine

## 2016-11-22 ENCOUNTER — Telehealth: Payer: Self-pay | Admitting: *Deleted

## 2016-11-22 NOTE — Telephone Encounter (Signed)
Called [facility] to see why patient is not able to get Contrave. Through [employer] medication will cost patient $240. Medicaid PA will be submitted to see if they will cover.Jasmine Gordon

## 2016-12-28 ENCOUNTER — Emergency Department
Admission: EM | Admit: 2016-12-28 | Discharge: 2016-12-28 | Disposition: A | Discharge status: Home / Self Care | Payer: BLUE CROSS/BLUE SHIELD | Attending: Emergency Medicine | Admitting: Emergency Medicine

## 2016-12-28 ENCOUNTER — Encounter: Payer: Self-pay | Admitting: Emergency Medicine

## 2016-12-28 DIAGNOSIS — R51 Headache: Secondary | ICD-10-CM | POA: Insufficient documentation

## 2016-12-28 DIAGNOSIS — J029 Acute pharyngitis, unspecified: Secondary | ICD-10-CM | POA: Insufficient documentation

## 2016-12-28 DIAGNOSIS — J45909 Unspecified asthma, uncomplicated: Secondary | ICD-10-CM | POA: Diagnosis not present

## 2016-12-28 DIAGNOSIS — Z79899 Other long term (current) drug therapy: Secondary | ICD-10-CM | POA: Insufficient documentation

## 2016-12-28 DIAGNOSIS — R1030 Lower abdominal pain, unspecified: Secondary | ICD-10-CM | POA: Diagnosis not present

## 2016-12-28 DIAGNOSIS — R110 Nausea: Secondary | ICD-10-CM | POA: Diagnosis not present

## 2016-12-28 DIAGNOSIS — R6889 Other general symptoms and signs: Secondary | ICD-10-CM

## 2016-12-28 LAB — URINALYSIS, COMPLETE (UACMP) WITH MICROSCOPIC
Bilirubin Urine: NEGATIVE
Glucose, UA: NEGATIVE mg/dL
Ketones, ur: NEGATIVE mg/dL
Nitrite: NEGATIVE
Protein, ur: NEGATIVE mg/dL
Specific Gravity, Urine: 1.027 (ref 1.005–1.030)
pH: 5 (ref 5.0–8.0)

## 2016-12-28 LAB — COMPREHENSIVE METABOLIC PANEL
ALT: 13 U/L — ABNORMAL LOW (ref 14–54)
AST: 17 U/L (ref 15–41)
Albumin: 4.4 g/dL (ref 3.5–5.0)
Alkaline Phosphatase: 58 U/L (ref 38–126)
Anion gap: 5 (ref 5–15)
BUN: 13 mg/dL (ref 6–20)
CO2: 27 mmol/L (ref 22–32)
Calcium: 9.1 mg/dL (ref 8.9–10.3)
Chloride: 106 mmol/L (ref 101–111)
Creatinine, Ser: 0.91 mg/dL (ref 0.44–1.00)
GFR calc Af Amer: >60 mL/min (ref >60)
GFR calc non Af Amer: >60 mL/min (ref >60)
Glucose, Bld: 100 mg/dL — ABNORMAL HIGH (ref 65–99)
Potassium: 3.7 mmol/L (ref 3.5–5.1)
Sodium: 138 mmol/L (ref 135–145)
Total Bilirubin: 0.3 mg/dL (ref 0.3–1.2)
Total Protein: 7.8 g/dL (ref 6.5–8.1)

## 2016-12-28 LAB — CBC
HCT: 34.9 % — ABNORMAL LOW (ref 35.0–47.0)
Hemoglobin: 11.3 g/dL — ABNORMAL LOW (ref 12.0–16.0)
MCH: 26.1 pg (ref 26.0–34.0)
MCHC: 32.4 g/dL (ref 32.0–36.0)
MCV: 80.5 fL (ref 80.0–100.0)
Platelets: 240 10*3/uL (ref 150–440)
RBC: 4.33 MIL/uL (ref 3.80–5.20)
RDW: 14.6 % — ABNORMAL HIGH (ref 11.5–14.5)
WBC: 6.4 10*3/uL (ref 3.6–11.0)

## 2016-12-28 LAB — LIPASE, BLOOD: Lipase: 29 U/L (ref 11–51)

## 2016-12-28 LAB — POCT PREGNANCY, URINE: Preg Test, Ur: NEGATIVE

## 2016-12-28 MED ORDER — ONDANSETRON 4 MG PO TBDP
4.0000 mg | ORAL_TABLET | Freq: Once | ORAL | Status: AC | PRN
  Administered 2016-12-28: 4 mg via ORAL
  Filled 2016-12-28: qty 1

## 2016-12-28 MED ORDER — OSELTAMIVIR PHOSPHATE 75 MG PO CAPS: 75.0000 mg | ORAL_CAPSULE | Freq: Two times a day (BID) | ORAL | 0 refills | Status: DC

## 2016-12-28 MED ORDER — ONDANSETRON 4 MG PO TBDP: 4.0000 mg | ORAL_TABLET | Freq: Three times a day (TID) | ORAL | 0 refills | Status: DC | PRN

## 2016-12-28 MED ORDER — FOSFOMYCIN TROMETHAMINE 3 G PO PACK
3.0000 g | PACK | Freq: Once | ORAL | Status: AC
  Administered 2016-12-28: 3 g via ORAL
  Filled 2016-12-28: qty 3

## 2016-12-28 NOTE — ED Triage Notes (Signed)
Pt presents to ED with multiple medical complaints, sore throat x 1 week, headache x 2 days, abdominal pain, nausea, and body aches since yesterday. Pt denies urinary symptoms or chest pain. Pt alert and oriented x 4, respirations even and unlabored.

## 2016-12-28 NOTE — Discharge Instructions (Signed)
1. You may take Tamiflu as prescribed. 2. Take Zofran as needed for nausea.  3. Return to the ER for worsening symptoms, persistent vomiting, difficulty breathing or other concerns.

## 2016-12-28 NOTE — ED Notes (Signed)

## 2016-12-28 NOTE — ED Provider Notes (Signed)
[facility] Emergency Department Provider Note   ____________________________________________   First MD Initiated Contact with Patient 12/28/16 0315     (approximate)  I have reviewed the triage vital signs and the nursing notes.   HISTORY  Chief Complaint Abdominal Pain    HPI Jasmine Gordon is a 31 y.o. female who presents to the ED from home with flulike symptoms. Patient reports a one-week history of sore throat, 2 day history of headache, one-day history of body aches, lower abdominal pain and nausea. + sick contacts. Patient denies fever, chills, chest pain, shortness of breath. Denies recent travel or trauma. Nothing makes her symptoms better or worse.   Past Medical History:  Diagnosis Date  . Allergy   . Anemia   . Anxiety   . Asthma   . Bronchiolitis   . Depression   . Migraine   . Panic attack   . PTSD (post-traumatic stress disorder) 2016    Patient Active Problem List   Diagnosis Date Noted  . Vitamin D deficiency 11/08/2016  . Iron deficiency anemia due to chronic blood loss 10/03/2016  . Microcytic anemia 07/15/2016  . Menorrhagia with regular cycle 06/27/2016  . Mild persistent asthma 06/17/2016  . Anxiety and depression 06/17/2016  . BMI 38.0-38.9,adult 06/17/2016    Past Surgical History:  Procedure Laterality Date  . APPENDECTOMY    . Plantar warts      Prior to Admission medications   Medication Sig Start Date End Date Taking? Authorizing Provider  albuterol (PROVENTIL HFA;VENTOLIN HFA) 108 (90 Base) MCG/ACT inhaler Inhale 2 puffs into the lungs every 6 (six) hours as needed for wheezing or shortness of breath. 06/17/16   Amy Rusty Aus, NP  albuterol (PROVENTIL) (2.5 MG/3ML) 0.083% nebulizer solution Take 3 mLs (2.5 mg total) by nebulization every 6 (six) hours as needed for wheezing or shortness of breath. 11/11/16   [name], DO  dicyclomine (BENTYL) 10 MG capsule Take 1 capsule (10 mg  total) by mouth 3 (three) times daily as needed for spasms. 11/12/16 11/26/16  Willy Eddy, MD  FLOVENT HFA 220 MCG/ACT inhaler  06/22/16   Historical Provider, MD  Naltrexone-Bupropion HCl ER (CONTRAVE) 8-90 MG TB12 Take 2 tablets by mouth 2 (two) times daily. 11/08/16   Netta Neat Karamalegos, DO  ondansetron (ZOFRAN ODT) 4 MG disintegrating tablet Take 1 tablet (4 mg total) by mouth every 8 (eight) hours as needed for nausea or vomiting. 12/28/16   Irean Hong, MD  oseltamivir (TAMIFLU) 75 MG capsule Take 1 capsule (75 mg total) by mouth 2 (two) times daily. 12/28/16   Irean Hong, MD  promethazine (PHENERGAN) 12.5 MG tablet Take 1 tablet (12.5 mg total) by mouth every 6 (six) hours as needed for nausea or vomiting. 11/12/16   Willy Eddy, MD    Allergies Asa [aspirin]; Reglan [metoclopramide]; and Turmeric  Family History  Problem Relation Age of Onset  . Depression Mother   . Mental retardation Mother   . Bipolar disorder Mother   . Diabetes Father   . Stroke Maternal Grandfather   . Diabetes Maternal Grandfather     Social History Social History  Substance Use Topics  . Smoking status: Never Smoker  . Smokeless tobacco: Never Used  . Alcohol use No     Comment: occ    Review of Systems  Constitutional: Positive for body aches. No fever/chills. Eyes: No visual changes. ENT: Positive for sore throat. Cardiovascular: Denies chest pain. Respiratory:  Denies shortness of breath. Gastrointestinal: No abdominal pain.  Positive for nausea, no vomiting.  No diarrhea.  No constipation. Genitourinary: Negative for dysuria. Musculoskeletal: Negative for back pain. Skin: Negative for rash. Neurological: Negative for headaches, focal weakness or numbness.  10-point ROS otherwise negative.  ____________________________________________   PHYSICAL EXAM:  VITAL SIGNS: ED Triage Vitals  Enc Vitals Group     BP 12/28/16 0042 129/75     Pulse Rate 12/28/16 0042 97     Resp  12/28/16 0042 16     Temp 12/28/16 0042 98.1 F (36.7 C)     Temp Source 12/28/16 0042 Oral     SpO2 12/28/16 0042 100 %     Weight 12/28/16 0045 243 lb (110.2 kg)     Height 12/28/16 0045 5\' 7"  (1.702 m)     Head Circumference --      Peak Flow --      Pain Score 12/28/16 0055 8     Pain Loc --      Pain Edu? --      Excl. in GC? --     Constitutional: Asleep, awakened for exam. Alert and oriented. Well appearing and in no acute distress. Eyes: Conjunctivae are normal. PERRL. EOMI. Head: Atraumatic. Nose: No congestion/rhinnorhea. Mouth/Throat: Mucous membranes are moist.  Oropharynx mildly erythematous without tonsillar swelling, exudates or peritonsillar abscess. There is no hoarse or muffled voice. There is no drooling. Neck: No stridor.   Cardiovascular: Normal rate, regular rhythm. Grossly normal heart sounds.  Good peripheral circulation. Respiratory: Normal respiratory effort.  No retractions. Lungs CTAB. Gastrointestinal: Soft and nontender to light or deep palpation.. No distention. No abdominal bruits. No CVA tenderness. Musculoskeletal: No lower extremity tenderness nor edema.  No joint effusions. Neurologic:  Normal speech and language. No gross focal neurologic deficits are appreciated. No gait instability. Skin:  Skin is warm, dry and intact. No rash noted. No petechiae. Psychiatric: Mood and affect are normal. Speech and behavior are normal.  ____________________________________________   LABS (all labs ordered are listed, but only abnormal results are displayed)  Labs Reviewed  COMPREHENSIVE METABOLIC PANEL - Abnormal; Notable for the following:       Result Value   Glucose, Bld 100 (*)    ALT 13 (*)    All other components within normal limits  CBC - Abnormal; Notable for the following:    Hemoglobin 11.3 (*)    HCT 34.9 (*)    RDW 14.6 (*)    All other components within normal limits  URINALYSIS, COMPLETE (UACMP) WITH MICROSCOPIC - Abnormal; Notable for  the following:    Color, Urine YELLOW (*)    APPearance HAZY (*)    Hgb urine dipstick MODERATE (*)    Leukocytes, UA SMALL (*)    Bacteria, UA RARE (*)    Squamous Epithelial / LPF 6-30 (*)    All other components within normal limits  LIPASE, BLOOD  POC URINE PREG, ED  POCT PREGNANCY, URINE   ____________________________________________  EKG  None ____________________________________________  RADIOLOGY  None ____________________________________________   PROCEDURES  Procedure(s) performed: None  Procedures  Critical Care performed: No  ____________________________________________   INITIAL IMPRESSION / ASSESSMENT AND PLAN / ED COURSE  Pertinent labs & imaging results that were available during my care of the patient were reviewed by me and considered in my medical decision making (see chart for details).  31 year old female who presents with flulike symptoms; would like a prescription for Tamiflu. Fosfomycin given in the ED  for mild UTI. Strict return precautions given. Patient verbalizes understanding and agrees with plan of care.      ____________________________________________   FINAL CLINICAL IMPRESSION(S) / ED DIAGNOSES  Final diagnoses:  Flu-like symptoms      NEW MEDICATIONS STARTED DURING THIS VISIT:  New Prescriptions   ONDANSETRON (ZOFRAN ODT) 4 MG DISINTEGRATING TABLET    Take 1 tablet (4 mg total) by mouth every 8 (eight) hours as needed for nausea or vomiting.   OSELTAMIVIR (TAMIFLU) 75 MG CAPSULE    Take 1 capsule (75 mg total) by mouth 2 (two) times daily.     Note:  This document was prepared using Dragon voice recognition software and may include unintentional dictation errors.    Irean Hong, MD 12/28/16 [phone]

## 2017-01-02 ENCOUNTER — Ambulatory Visit: Payer: BLUE CROSS/BLUE SHIELD | Admitting: Dietician

## 2017-01-20 ENCOUNTER — Inpatient Hospital Stay: Payer: BLUE CROSS/BLUE SHIELD | Attending: Hematology and Oncology

## 2017-01-22 ENCOUNTER — Emergency Department: Payer: BLUE CROSS/BLUE SHIELD

## 2017-01-22 DIAGNOSIS — R42 Dizziness and giddiness: Secondary | ICD-10-CM | POA: Diagnosis not present

## 2017-01-22 DIAGNOSIS — Z5321 Procedure and treatment not carried out due to patient leaving prior to being seen by health care provider: Secondary | ICD-10-CM | POA: Insufficient documentation

## 2017-01-22 DIAGNOSIS — Z79899 Other long term (current) drug therapy: Secondary | ICD-10-CM | POA: Insufficient documentation

## 2017-01-22 DIAGNOSIS — J45909 Unspecified asthma, uncomplicated: Secondary | ICD-10-CM | POA: Diagnosis not present

## 2017-01-22 DIAGNOSIS — R0602 Shortness of breath: Secondary | ICD-10-CM | POA: Diagnosis present

## 2017-01-22 DIAGNOSIS — N898 Other specified noninflammatory disorders of vagina: Secondary | ICD-10-CM | POA: Diagnosis not present

## 2017-01-22 LAB — CBC
HCT: 33.4 % — ABNORMAL LOW (ref 35.0–47.0)
Hemoglobin: 11.1 g/dL — ABNORMAL LOW (ref 12.0–16.0)
MCH: 26.7 pg (ref 26.0–34.0)
MCHC: 33.2 g/dL (ref 32.0–36.0)
MCV: 80.3 fL (ref 80.0–100.0)
Platelets: 206 10*3/uL (ref 150–440)
RBC: 4.15 MIL/uL (ref 3.80–5.20)
RDW: 14.5 % (ref 11.5–14.5)
WBC: 5.6 10*3/uL (ref 3.6–11.0)

## 2017-01-22 LAB — URINALYSIS, COMPLETE (UACMP) WITH MICROSCOPIC
Bilirubin Urine: NEGATIVE
Glucose, UA: NEGATIVE mg/dL
Hgb urine dipstick: NEGATIVE
Ketones, ur: NEGATIVE mg/dL
Nitrite: NEGATIVE
Protein, ur: NEGATIVE mg/dL
Specific Gravity, Urine: 1.024 (ref 1.005–1.030)
pH: 6 (ref 5.0–8.0)

## 2017-01-22 LAB — BASIC METABOLIC PANEL
Anion gap: 5 (ref 5–15)
BUN: 13 mg/dL (ref 6–20)
CO2: 26 mmol/L (ref 22–32)
Calcium: 9.5 mg/dL (ref 8.9–10.3)
Chloride: 108 mmol/L (ref 101–111)
Creatinine, Ser: 0.81 mg/dL (ref 0.44–1.00)
GFR calc Af Amer: >60 mL/min (ref >60)
GFR calc non Af Amer: >60 mL/min (ref >60)
Glucose, Bld: 85 mg/dL (ref 65–99)
Potassium: 3.5 mmol/L (ref 3.5–5.1)
Sodium: 139 mmol/L (ref 135–145)

## 2017-01-22 LAB — POCT PREGNANCY, URINE: Preg Test, Ur: NEGATIVE

## 2017-01-22 NOTE — ED Triage Notes (Signed)
Pt ambulatory to triage with no difficulty. Pt reports she started feeling short of breath today on her way to the ED. Pt also reports she was feeling dizzy and has been having vaginal discharge. Pt states she used her inhaler around 4 pm today. Pt has hx of panic attacks. Pt alert and orientated, resp even and unlabored and pt is talking in full sentences with no difficulty at this time.

## 2017-01-23 ENCOUNTER — Emergency Department
Admission: EM | Admit: 2017-01-23 | Discharge: 2017-01-23 | Disposition: A | Discharge status: Home / Self Care | Payer: BLUE CROSS/BLUE SHIELD | Attending: Emergency Medicine | Admitting: Emergency Medicine

## 2017-01-24 ENCOUNTER — Encounter: Payer: Self-pay | Admitting: Family Medicine

## 2017-01-29 ENCOUNTER — Emergency Department
Admission: EM | Admit: 2017-01-29 | Discharge: 2017-01-29 | Disposition: A | Discharge status: Home / Self Care | Payer: BLUE CROSS/BLUE SHIELD | Attending: Emergency Medicine | Admitting: Emergency Medicine

## 2017-01-29 DIAGNOSIS — J4530 Mild persistent asthma, uncomplicated: Secondary | ICD-10-CM | POA: Diagnosis not present

## 2017-01-29 DIAGNOSIS — T781XXA Other adverse food reactions, not elsewhere classified, initial encounter: Secondary | ICD-10-CM | POA: Diagnosis not present

## 2017-01-29 DIAGNOSIS — T7840XA Allergy, unspecified, initial encounter: Secondary | ICD-10-CM | POA: Diagnosis present

## 2017-01-29 DIAGNOSIS — F329 Major depressive disorder, single episode, unspecified: Secondary | ICD-10-CM | POA: Diagnosis not present

## 2017-01-29 MED ORDER — PREDNISONE 10 MG PO TABS: ORAL_TABLET | ORAL | 0 refills | Status: DC

## 2017-01-29 MED ORDER — DEXAMETHASONE SODIUM PHOSPHATE 10 MG/ML IJ SOLN
10.0000 mg | Freq: Once | INTRAMUSCULAR | Status: AC
  Administered 2017-01-29: 10 mg via INTRAMUSCULAR
  Filled 2017-01-29: qty 1

## 2017-01-29 MED ORDER — EPINEPHRINE 0.3 MG/0.3ML IJ SOAJ: 0.3000 mg | Freq: Once | INTRAMUSCULAR | 0 refills | Status: AC

## 2017-01-29 NOTE — ED Triage Notes (Signed)
Pt c/o waking this morning with facial swelling , states she had eaten some shrimp before she went to bed last night.. Pt is in NAD on arrival, respirations WNL..Marland Kitchen

## 2017-01-29 NOTE — ED Notes (Signed)
Pt states face swollen upon waking. No noticeable swelling at this time. Pt states face and neck itching.

## 2017-01-29 NOTE — ED Provider Notes (Signed)
[facility] Emergency Department Provider Note  ____________________________________________   First MD Initiated Contact with Patient 01/29/17 1416     (approximate)  I have reviewed the triage vital signs and the nursing notes.   HISTORY  Chief Complaint Allergic Reaction   HPI Jasmine Gordon is a 31 y.o. female is here with complaint of possible seafood reaction. Patient states that she ate some shrimp that was prepared by her husband last night before going to bed. She states that before she was able to go to sleep she "tossed and turned for an hour". She states that she felt as if she was having some problems breathing but did not note any wheezing, difficulty swallowing or talking.  She attributed this to a "panic attack".   She did not take any over-the-counter medication last evening. She states that when she woke up this morning she had facial swelling that she attributes to the prescription last night. She states that several months ago she was in the emergency room for a reaction to turmeric.  She has not been seen by her PCP for either of these reactions. She denies any other food related allergies. She denies any difficulty breathing at this time and states that she drove herself to the emergency room. Patient rates her pain as 0/10 at this time.   Past Medical History:  Diagnosis Date  . Allergy   . Anemia   . Anxiety   . Asthma   . Bronchiolitis   . Depression   . Migraine   . Panic attack   . PTSD (post-traumatic stress disorder) 2016    Patient Active Problem List   Diagnosis Date Noted  . Vitamin D deficiency 11/08/2016  . Iron deficiency anemia due to chronic blood loss 10/03/2016  . Microcytic anemia 07/15/2016  . Menorrhagia with regular cycle 06/27/2016  . Mild persistent asthma 06/17/2016  . Anxiety and depression 06/17/2016  . BMI 38.0-38.9,adult 06/17/2016    Past Surgical History:  Procedure Laterality Date  .  APPENDECTOMY    . Plantar warts      Prior to Admission medications   Medication Sig Start Date End Date Taking? Authorizing Provider  albuterol (PROVENTIL HFA;VENTOLIN HFA) 108 (90 Base) MCG/ACT inhaler Inhale 2 puffs into the lungs every 6 (six) hours as needed for wheezing or shortness of breath. 06/17/16   Amy Rusty AusLauren Krebs, NP  albuterol (PROVENTIL) (2.5 MG/3ML) 0.083% nebulizer solution Take 3 mLs (2.5 mg total) by nebulization every 6 (six) hours as needed for wheezing or shortness of breath. 11/11/16   Smitty CordsAlexander J Karamalegos, DO  dicyclomine (BENTYL) 10 MG capsule Take 1 capsule (10 mg total) by mouth 3 (three) times daily as needed for spasms. 11/12/16 11/26/16  Willy EddyPatrick Robinson, MD  EPINEPHrine 0.3 mg/0.3 mL IJ SOAJ injection Inject 0.3 mLs (0.3 mg total) into the muscle once. 01/29/17 01/29/17  Tommi Rumpshonda L Dorissa Stinnette, PA-C  [facility]ENT [facility] MCG/ACT inhaler  06/22/16   Historical Provider, MD  Naltrexone-Bupropion HCl ER (CONTRAVE) 8-90 MG TB12 Take 2 tablets by mouth 2 (two) times daily. 11/08/16   Netta NeatAlexander J Karamalegos, DO  ondansetron (ZOFRAN ODT) 4 MG disintegrating tablet Take 1 tablet (4 mg total) by mouth every 8 (eight) hours as needed for nausea or vomiting. 12/28/16   Irean HongJade J Sung, MD  oseltamivir (TAMIFLU) 75 MG capsule Take 1 capsule (75 mg total) by mouth 2 (two) times daily. 12/28/16   Irean HongJade J Sung, MD  predniSONE (DELTASONE) 10 MG tablet  Take 6 tablets  today, on day 2 take 5 tablets, day 3 take 4 tablets, day 4 take 3 tablets, day 5 take  2 tablets and 1 tablet the last day 01/29/17   Tommi Rumps, PA-C  promethazine (PHENERGAN) 12.5 MG tablet Take 1 tablet (12.5 mg total) by mouth every 6 (six) hours as needed for nausea or vomiting. 11/12/16   Willy Eddy, MD    Allergies Asa [aspirin]; Reglan [metoclopramide]; and Turmeric  Family History  Problem Relation Age of Onset  . Depression Mother   . Mental retardation Mother   . Bipolar disorder Mother   . Diabetes Father   .  Stroke Maternal Grandfather   . Diabetes Maternal Grandfather     Social History Social History  Substance Use Topics  . Smoking status: Never Smoker  . Smokeless tobacco: Never Used  . Alcohol use No     Comment: occ    Review of Systems Constitutional: No fever/chills Eyes: No visual changes. ENT: No sore throat. Negative for difficulty swallowing. Cardiovascular: Denies chest pain. Respiratory: Denies shortness of breath. Gastrointestinal: No abdominal pain.  No nausea, no vomiting.  Skin: Negative for rash. Positive for facial "tightness". Neurological: Negative for headaches, focal weakness or numbness. Psychiatric:Positive for depression.  Positive for panic attacks. Hematological/Lymphatic:Positive for microcytic anemia. Positive for vitamin D deficiency. Allergic/Immunilogical:  Positive for mild persistent asthma. Positive for aspirin, Reglan and tumeric  allergy. 10-point ROS otherwise negative.  ____________________________________________   PHYSICAL EXAM:  VITAL SIGNS: ED Triage Vitals [01/29/17 1359]  Enc Vitals Group     BP      Pulse      Resp      Temp      Temp src      SpO2      Weight 248 lb (112.5 kg)     Height 5\' 7"  (1.702 m)     Head Circumference      Peak Flow      Pain Score 0     Pain Loc      Pain Edu?      Excl. in GC?     Constitutional: Alert and oriented. Well appearing and in no acute distress. Eyes: Conjunctivae are normal. PERRL. EOMI. Head: Atraumatic. Nose: No congestion/rhinnorhea. Mouth/Throat: Mucous membranes are moist.  Oropharynx non-erythematous.  No edema noted. Neck: No stridor.   Hematological/Lymphatic/Immunilogical: No cervical lymphadenopathy. Cardiovascular: Normal rate, regular rhythm. Grossly normal heart sounds.  Good peripheral circulation. Respiratory: Normal respiratory effort.  No retractions. Lungs CTAB. Musculoskeletal: Moves upper and lower extremities without any difficulty. Normal gait was  noted. Neurologic:  Normal speech and language. No gross focal neurologic deficits are appreciated. No gait instability. Skin:  Skin is warm, dry and intact. No erythema or urticarial lesions noted on the upper extremities, trunk or lower extremities. Psychiatric: Mood and affect are normal. Speech and behavior are normal.  ____________________________________________   LABS (all labs ordered are listed, but only abnormal results are displayed)  Labs Reviewed - No data to display   PROCEDURES  Procedure(s) performed: None  Procedures  Critical Care performed: No  ____________________________________________   INITIAL IMPRESSION / ASSESSMENT AND PLAN / ED COURSE  Pertinent labs & imaging results that were available during my care of the patient were reviewed by me and considered in my medical decision making (see chart for details).  Patient given Decadron 10 mg IM while in the emergency room. Patient was a driver and alone. Patient is instructed to  begin taking Claritin or Zyrtec over-the-counter daily. She is also given a prescription for prednisone tapering dose and an EpiPen. Patient is to follow-up with her primary care doctor and a referral to an allergy specialist.      ____________________________________________   FINAL CLINICAL IMPRESSION(S) / ED DIAGNOSES  Final diagnoses:  Allergic reaction to food, initial encounter      NEW MEDICATIONS STARTED DURING THIS VISIT:  New Prescriptions   EPINEPHRINE 0.3 MG/0.3 ML IJ SOAJ INJECTION    Inject 0.3 mLs (0.3 mg total) into the muscle once.   PREDNISONE (DELTASONE) 10 MG TABLET    Take 6 tablets  today, on day 2 take 5 tablets, day 3 take 4 tablets, day 4 take 3 tablets, day 5 take  2 tablets and 1 tablet the last day     Note:  This document was prepared using Dragon voice recognition software and may include unintentional dictation errors.    Tommi Rumps, PA-C 01/29/17 1533    Arnaldo Natal,  MD 01/30/17 [phone]

## 2017-01-29 NOTE — Discharge Instructions (Signed)
Follow-up with your primary care doctor for referral to allergy specialist. Begin taking Zyrtec or Claritin daily. You may also take Benadryl 1-2 capsules every 6 hours as needed for itching or swelling. Begin prednisone 60mg  and taper down.   EpiPen as needed for food allergies such as difficulty breathing, swallowing, hives, itching. Return to the emergency room immediately if any severe worsening of your symptoms.

## 2017-02-07 ENCOUNTER — Encounter: Payer: Self-pay | Admitting: Family Medicine

## 2017-02-07 ENCOUNTER — Inpatient Hospital Stay: Payer: BLUE CROSS/BLUE SHIELD | Admitting: Family Medicine

## 2017-02-08 ENCOUNTER — Encounter: Payer: Self-pay | Admitting: Family Medicine

## 2017-02-08 ENCOUNTER — Ambulatory Visit (INDEPENDENT_AMBULATORY_CARE_PROVIDER_SITE_OTHER): Payer: BLUE CROSS/BLUE SHIELD | Admitting: Family Medicine

## 2017-02-08 VITALS — BP 113/70 | HR 111 | Temp 98.8°F | Resp 16 | Ht 67.0 in | Wt 249.0 lb

## 2017-02-08 DIAGNOSIS — T7840XA Allergy, unspecified, initial encounter: Secondary | ICD-10-CM | POA: Diagnosis not present

## 2017-02-08 DIAGNOSIS — Z6838 Body mass index (BMI) 38.0-38.9, adult: Secondary | ICD-10-CM

## 2017-02-08 DIAGNOSIS — L500 Allergic urticaria: Secondary | ICD-10-CM | POA: Diagnosis not present

## 2017-02-08 NOTE — Assessment & Plan Note (Addendum)
Resolved, after IM Decadron in ED. Suspected food allergy based on history, question if shrimp is allergen. Not consistent with anaphylaxis based on history. Concern patient has atopic history with Asthma as well. Recommended to take Claritin daily now, has prednisone if needs this, and also has EpiPen for emergency, knows when to seek more immediate treatment for serious allergic reaction  Referral placed today for [facility] for establishing as new patient initial allergy testing and management given recurrent episodes, and recent ED visit, question testing for food allergies

## 2017-02-08 NOTE — Progress Notes (Signed)
Subjective:    Patient ID: Jasmine Gordon, female    DOB: 05-13-1986, 31 y.o.   MRN: [id number]  Jasmine Gordon is a 31 y.o. female presenting on 02/08/2017 for Hospitalization Follow-up (obtw pt wants to discuss about weight loss meds needed prior approval)   HPI   ED FOLLOW-UP Allergic Reaction Here today to follow-up after visit to ED 01/29/17 for acute allergic reaction suspected due to food allergy, concern possible seafood, with eating "shrimp dip", however she has tolerated shrimp in past without allergy. Additionally has had similar less severe allergic reaction to taking Tumeric months ago, previously did not have significant allergies. - Described symptoms as initially only felt tired then went to sleep, then woke up with throat feeling itchy, possible generalized facial swelling, not localized to lips tongue or throat. Also admits some hives. In ED she received Decadron 10mg  IM x 1 with significant improvement. Given rx Claritin, Prednisone taper and EpiPen. She has these rx but did not start taking Prednisone given resolution of symptoms, has on hand if needed. Did not start claritin. - Today doing well without any symptoms, asking about referral to Allergist for allergy testing and lab work - Denies any current concerns of shortness of breath, hives, itching, rash, swelling, throat symptoms  WEIGHT MANAGEMENT - Additionally, asking today about Contrave rx, she is out now for >1 month, due to lack of ins coverage, previously had been on this, asking status of prior auth today - She missed apt with [facility] - Also admits may have seen GYN in past for weight management clinic but did not follow-up   Social History  Substance Use Topics  . Smoking status: Never Smoker  . Smokeless tobacco: Never Used  . Alcohol use No     Comment: occ    Review of Systems Per HPI unless specifically indicated above     Objective:    BP 113/70   Pulse (!) 111    Temp 98.8 F (37.1 C) (Oral)   Resp 16   Ht 5\' 7"  (1.702 m)   Wt 249 lb (112.9 kg)   LMP 01/18/2017 (Exact Date)   BMI 39.00 kg/m   Wt Readings from Last 3 Encounters:  02/08/17 249 lb (112.9 kg)  01/29/17 248 lb (112.5 kg)  01/22/17 248 lb (112.5 kg)    Physical Exam  Constitutional: She is oriented to person, place, and time. She appears well-developed and well-nourished. No distress.  Well-appearing, comfortable, cooperative, obese  HENT:  Head: Normocephalic and atraumatic.  Mouth/Throat: Oropharynx is clear and moist.  Oral Cavity: - Tongue and oral mucosa is normal appearing without evidence of swelling - Lips normal without swelling  No facial swelling  Eyes: Conjunctivae are normal.  Neck: Normal range of motion. Neck supple.  Cardiovascular: Regular rhythm, normal heart sounds and intact distal pulses.   No murmur heard. Tachycardia  Pulmonary/Chest: Effort normal and breath sounds normal. No respiratory distress. She has no wheezes. She has no rales.  Musculoskeletal: She exhibits no edema.  Neurological: She is alert and oriented to person, place, and time.  Skin: Skin is warm and dry. No rash noted. She is not diaphoretic. No erythema.  Psychiatric: Her behavior is normal.  Nursing note and vitals reviewed.    I have personally reviewed the following lab results from 01/23/17.  Results for orders placed or performed during the hospital encounter of 01/23/17  Basic metabolic panel  Result Value Ref Range   Sodium 139  135 - 145 mmol/L   Potassium 3.5 3.5 - 5.1 mmol/L   Chloride 108 101 - 111 mmol/L   CO2 26 22 - 32 mmol/L   Glucose, Bld 85 65 - 99 mg/dL   BUN 13 6 - 20 mg/dL   Creatinine, Ser 4.09 0.44 - 1.00 mg/dL   Calcium 9.5 8.9 - 81.1 mg/dL   GFR calc non Af Amer >60 >60 mL/min   GFR calc Af Amer >60 >60 mL/min   Anion gap 5 5 - 15  CBC  Result Value Ref Range   WBC 5.6 3.6 - 11.0 K/uL   RBC 4.15 3.80 - 5.20 MIL/uL   Hemoglobin 11.1 (L) 12.0 -  16.0 g/dL   HCT 91.4 (L) 78.2 - 95.6 %   MCV 80.3 80.0 - 100.0 fL   MCH 26.7 26.0 - 34.0 pg   MCHC 33.2 32.0 - 36.0 g/dL   RDW 21.3 08.6 - 57.8 %   Platelets 206 150 - 440 K/uL  Urinalysis, Complete w Microscopic  Result Value Ref Range   Color, Urine YELLOW (A) YELLOW   APPearance CLEAR (A) CLEAR   Specific Gravity, Urine 1.024 1.005 - 1.030   pH 6.0 5.0 - 8.0   Glucose, UA NEGATIVE NEGATIVE mg/dL   Hgb urine dipstick NEGATIVE NEGATIVE   Bilirubin Urine NEGATIVE NEGATIVE   Ketones, ur NEGATIVE NEGATIVE mg/dL   Protein, ur NEGATIVE NEGATIVE mg/dL   Nitrite NEGATIVE NEGATIVE   Leukocytes, UA TRACE (A) NEGATIVE   RBC / HPF 0-5 0 - 5 RBC/hpf   WBC, UA 0-5 0 - 5 WBC/hpf   Bacteria, UA RARE (A) NONE SEEN   Squamous Epithelial / LPF 0-5 (A) NONE SEEN   Mucous PRESENT   Pregnancy, urine POC  Result Value Ref Range   Preg Test, Ur NEGATIVE NEGATIVE      Assessment & Plan:   Problem List Items Addressed This Visit    Urticaria due to food allergy    Resolved See A&P Referral to Allergy for further allergy testing      Relevant Orders   Ambulatory referral to Allergy   BMI 38.0-38.9,adult    Weight overall stable to improve down 5 lbs in >6 months. Now off contrave >1 month due to insurance PA. - A1c 5.6, borderline, not Pre-DM (10/2016)  Plan: 1. Advised to await PA approval for Contrave - checked status today, this is still pending was sent earlier. Anticipate it will get covered. 2. Contact info given again for [facility] - she is to re-schedule 3. Encourage work on improved lifestyle, restart going to gym regularly as planned 4. Also handout given contact info for Weight Management at [facility] as an option - self referral - this would be only if cannot get contrave or other med, advised I do not rx every weight loss med, and we may be limited by insurance 5. Follow-up as planned in 3 months for weight follow-up      Allergic reaction - Primary     Resolved, after IM Decadron in ED. Suspected food allergy based on history, question if shrimp is allergen. Not consistent with anaphylaxis based on history. Concern patient has atopic history with Asthma as well. Recommended to take Claritin daily now, has prednisone if needs this, and also has EpiPen for emergency, knows when to seek more immediate treatment for serious allergic reaction  Referral placed today for [facility] for establishing as new patient initial allergy testing and management given recurrent  episodes, and recent ED visit, question testing for food allergies      Relevant Orders   Ambulatory referral to Allergy      Meds ordered this encounter  Medications  . predniSONE (STERAPRED UNI-PAK 21 TAB) 10 MG (21) TBPK tablet    Sig: TK UTD    Refill:  0  . EPINEPHrine 0.3 mg/0.3 mL IJ SOAJ injection    Sig: INJECT INTRAMUSCULARLY AS DIRECTED    Refill:  0    Follow up plan: Return in about 3 months (around 05/11/2017).  Saralyn PilarAlexander Augusten Lipkin, DO [facility] 02/08/2017, 6:49 PM

## 2017-02-08 NOTE — Assessment & Plan Note (Signed)
Weight overall stable to improve down 5 lbs in >6 months. Now off contrave >1 month due to insurance PA. - A1c 5.6, borderline, not Pre-DM (10/2016)  Plan: 1. Advised to await PA approval for Contrave - checked status today, this is still pending was sent earlier. Anticipate it will get covered. 2. [facility] given [facility] - she is to re-schedule 3. Encourage work on improved lifestyle, restart going to gym regularly as planned 4. Also handout given contact info for Weight Management at [facility] as an option - self referral - this would be only if cannot get contrave or other med, advised I do not rx every weight loss med, and we may be limited by insurance 5. Follow-up as planned in 3 months for weight follow-up

## 2017-02-08 NOTE — Patient Instructions (Addendum)
Thank you for coming in to clinic today.  Referral [place] to [place]ergist in [place] - they should contact you within next few weeks with an appointment, if you don't hear back then contact their office to check status  [facility] [address] [place], [place]  [address] Phone: [phone]  Try to re-schedule  [facility]   Address: [address], [place], [place] [address] Phone: [phone]  If you decide that you want to go back to [facility] for Weight Management  Self Referral [facility] [address], [address] [place], [place] [address] Hours: 8am - 5pm Main: [phone]  If another allergic reaction then take the prednisone, and if any significant symptoms with difficulty breathing throat swelling then use EpiPen and go to hospital ED for treatment  You will be due for BLOOD WORK  - Please go ahead and schedule a "Lab Only" visit in the morning at the clinic for lab draw in next 6 weeks for Vitamin D  - Make sure Lab Only appointment is at least 1-2 weeks before your next appointment, so that results will be available  For Lab Results, once available within 2-3 days of blood draw, you can can log in to MyChart online to view your results and a brief explanation. Also, we can discuss results at next follow-up visit.   If you have any other questions or concerns, please feel free to call the clinic or send a message through MyChart. You may also schedule an earlier appointment if necessary.  Saralyn PilarAlexander Tyshana Nishida, DO [facility]

## 2017-02-08 NOTE — Assessment & Plan Note (Signed)
Resolved See A&P Referral to Allergy for further allergy testing

## 2017-02-11 ENCOUNTER — Encounter: Payer: Self-pay | Admitting: Emergency Medicine

## 2017-02-11 ENCOUNTER — Emergency Department
Admission: EM | Admit: 2017-02-11 | Discharge: 2017-02-12 | Disposition: A | Discharge status: Home / Self Care | Payer: BLUE CROSS/BLUE SHIELD | Attending: Emergency Medicine | Admitting: Emergency Medicine

## 2017-02-11 DIAGNOSIS — R21 Rash and other nonspecific skin eruption: Secondary | ICD-10-CM | POA: Diagnosis present

## 2017-02-11 DIAGNOSIS — J45909 Unspecified asthma, uncomplicated: Secondary | ICD-10-CM | POA: Insufficient documentation

## 2017-02-11 DIAGNOSIS — Z79899 Other long term (current) drug therapy: Secondary | ICD-10-CM | POA: Diagnosis not present

## 2017-02-11 DIAGNOSIS — T7840XA Allergy, unspecified, initial encounter: Secondary | ICD-10-CM | POA: Insufficient documentation

## 2017-02-11 DIAGNOSIS — T7840XD Allergy, unspecified, subsequent encounter: Secondary | ICD-10-CM

## 2017-02-11 MED ORDER — PREDNISONE 20 MG PO TABS
60.0000 mg | ORAL_TABLET | Freq: Once | ORAL | Status: AC
  Administered 2017-02-11: 60 mg via ORAL
  Filled 2017-02-11: qty 3

## 2017-02-11 MED ORDER — RANITIDINE HCL 150 MG PO TABS: 150.0000 mg | ORAL_TABLET | Freq: Two times a day (BID) | ORAL | 0 refills | Status: DC

## 2017-02-11 NOTE — ED Provider Notes (Signed)
[facility] Emergency Department Provider Note  ____________________________________________  Time seen: Approximately 11:18 PM  I have reviewed the triage vital signs and the nursing notes.   HISTORY  Chief Complaint Rash    HPI Jasmine Gordon is a 31 y.o. female that presents to the emergency department with allergic reaction rash for 1 day. Patient states thatshe was seen for allergic reaction weeks ago and was given steroid injection in ED. Patient states she felt perfect after steroid injection so she never began her prednisone prescription. Patient states that rash came back yesterday and is the same as before. Rash itches. Patient is not sure what she is allergic to and has an allergy referral from her PCP. Patient is just waiting for her appointment with the allergy clinic. Patient has a vitamin D deficiency and would like her vitamin D level checked. Patient states that she also does not think she drinks enough water. Patient denies fever, throat closing, shortness of breath, chest pain, nausea, vomiting, abdominal pain.   Past Medical History:  Diagnosis Date  . Allergy   . Anemia   . Anxiety   . Asthma   . Bronchiolitis   . Depression   . Migraine   . Panic attack   . PTSD (post-traumatic stress disorder) 2016    Patient Active Problem List   Diagnosis Date Noted  . Allergic reaction 02/08/2017  . Urticaria due to food allergy 02/08/2017  . Vitamin D deficiency 11/08/2016  . Iron deficiency anemia due to chronic blood loss 10/03/2016  . Microcytic anemia 07/15/2016  . Menorrhagia with regular cycle 06/27/2016  . Mild persistent asthma 06/17/2016  . Anxiety and depression 06/17/2016  . BMI 38.0-38.9,adult 06/17/2016    Past Surgical History:  Procedure Laterality Date  . APPENDECTOMY    . Plantar warts      Prior to Admission medications   Medication Sig Start Date End Date Taking? Authorizing Provider  albuterol (PROVENTIL  HFA;VENTOLIN HFA) 108 (90 Base) MCG/ACT inhaler Inhale 2 puffs into the lungs every 6 (six) hours as needed for wheezing or shortness of breath. 06/17/16  Yes Amy Rusty AusLauren Krebs, NP  albuterol (PROVENTIL) (2.5 MG/3ML) 0.083% nebulizer solution Take 3 mLs (2.5 mg total) by nebulization every 6 (six) hours as needed for wheezing or shortness of breath. 11/11/16  Yes Alexander J Karamalegos, DO  EPINEPHrine 0.3 mg/0.3 mL IJ SOAJ injection INJECT INTRAMUSCULARLY AS DIRECTED 01/29/17  Yes Historical Provider, MD  FLOVENT HFA 220 MCG/ACT inhaler  06/22/16  Yes Historical Provider, MD  ondansetron (ZOFRAN ODT) 4 MG disintegrating tablet Take 1 tablet (4 mg total) by mouth every 8 (eight) hours as needed for nausea or vomiting. 12/28/16  Yes Irean HongJade J Sung, MD  predniSONE (DELTASONE) 10 MG tablet Take 6 tablets  today, on day 2 take 5 tablets, day 3 take 4 tablets, day 4 take 3 tablets, day 5 take  2 tablets and 1 tablet the last day 01/29/17  Yes Tommi Rumpshonda L Summers, PA-C  ranitidine (ZANTAC) 150 MG tablet Take 1 tablet (150 mg total) by mouth 2 (two) times daily. 02/11/17 02/11/18  Enid DerryAshley Margie Urbanowicz, PA-C    Allergies Asa [aspirin]; Reglan [metoclopramide]; and Turmeric  Family History  Problem Relation Age of Onset  . Depression Mother   . Mental retardation Mother   . Bipolar disorder Mother   . Diabetes Father   . Stroke Maternal Grandfather   . Diabetes Maternal Grandfather     Social History Social History  Substance  Use Topics  . Smoking status: Never Smoker  . Smokeless tobacco: Never Used  . Alcohol use No     Comment: occ     Review of Systems  Constitutional: No fever/chills ENT: No upper respiratory complaints. Cardiovascular: No chest pain. Respiratory: No cough. No SOB. Gastrointestinal: No abdominal pain.  No nausea, no vomiting.  Musculoskeletal: Negative for musculoskeletal pain. Skin: Negative for abrasions, lacerations, ecchymosis. Positive for rash. Neurological: Negative for  headaches, numbness or tingling   ____________________________________________   PHYSICAL EXAM:  VITAL SIGNS: ED Triage Vitals  Enc Vitals Group     BP 02/11/17 2155 128/67     Pulse Rate 02/11/17 2155 (!) 107     Resp 02/11/17 2155 18     Temp 02/11/17 2155 99.1 F (37.3 C)     Temp Source 02/11/17 2155 Oral     SpO2 02/11/17 2155 100 %     Weight 02/11/17 2158 252 lb 8 oz (114.5 kg)     Height 02/11/17 2158 5\' 7"  (1.702 m)     Head Circumference --      Peak Flow --      Pain Score 02/11/17 2158 0     Pain Loc --      Pain Edu? --      Excl. in GC? --      Constitutional: Alert and oriented. Well appearing and in no acute distress. Eyes: Conjunctivae are normal. PERRL. EOMI. Head: Atraumatic. ENT:      Ears:      Nose: No congestion/rhinnorhea.      Mouth/Throat: Mucous membranes are moist. Oropharynx non-erythematous. Neck: No stridor.  No swelling. Cardiovascular: Normal rate, regular rhythm.  Good peripheral circulation. Respiratory: Normal respiratory effort without tachypnea or retractions. Lungs CTAB. Good air entry to the bases with no decreased or absent breath sounds. Musculoskeletal: Full range of motion to all extremities. No gross deformities appreciated. Neurologic:  Normal speech and language. No gross focal neurologic deficits are appreciated.  Skin:  Skin is warm, dry and intact.  Wheals over her chest and upper back.   ____________________________________________   LABS (all labs ordered are listed, but only abnormal results are displayed)  Labs Reviewed - No data to display ____________________________________________  EKG   ____________________________________________  RADIOLOGY  No results found.  ____________________________________________    PROCEDURES  Procedure(s) performed:    Procedures    Medications  predniSONE (DELTASONE) tablet 60 mg (60 mg Oral Given 02/11/17 2255)      ____________________________________________   INITIAL IMPRESSION / ASSESSMENT AND PLAN / ED COURSE  Pertinent labs & imaging results that were available during my care of the patient were reviewed by me and considered in my medical decision making (see chart for details).  Review of the [place] CSRS was performed in accordance of the [facility] prior to dispensing any controlled drugs.     Patient's diagnosis is consistent with allergic reaction. Vital signs and exam are reassuring. No indication of anaphylaxis. Patient was given oral prednisone in ED. Patient is to begin prednisone prescription that she never began last time. Patient will also begin taking Benadryl and ranitidine. Patient will be discharged home with prescriptions for ranitidine. Patient is going to call office on Monday for appointment for allergy testing. Patient is to follow up with PCP as directed. Patient is given ED precautions to return to the ED for any worsening or new symptoms.   ____________________________________________  FINAL CLINICAL IMPRESSION(S) / ED DIAGNOSES  Final diagnoses:  Allergic  reaction, subsequent encounter      NEW MEDICATIONS STARTED DURING THIS VISIT:  New Prescriptions   RANITIDINE (ZANTAC) 150 MG TABLET    Take 1 tablet (150 mg total) by mouth 2 (two) times daily.        This chart was dictated using voice recognition software/Dragon. Despite best efforts to proofread, errors can occur which can change the meaning. Any change was purely unintentional.    Enid Derry, PA-C 02/11/17 [id number]    Merrily Brittle, MD 02/12/17 [phone]

## 2017-02-11 NOTE — ED Notes (Signed)
Pt reports rash to upper back and neck that started a few weeks ago. Rash got better after treatment here but came back yesterday. Not several bumps to upper back. Pt reports the area itches.

## 2017-02-11 NOTE — ED Triage Notes (Signed)
Pt reports rash to her neck and upper back; has been seen here for same about 2 weeks ago; was given prednisone injection and prescribed prednisone to take at home; never took prednisone at home because she was feeling better after the injection; is currently waiting for call back from an office for allergy testing; pt in no acute distress

## 2017-02-15 ENCOUNTER — Encounter: Payer: Self-pay | Admitting: Family Medicine

## 2017-02-20 ENCOUNTER — Emergency Department
Admission: EM | Admit: 2017-02-20 | Discharge: 2017-02-20 | Disposition: A | Discharge status: Home / Self Care | Payer: BLUE CROSS/BLUE SHIELD | Attending: Emergency Medicine | Admitting: Emergency Medicine

## 2017-02-20 ENCOUNTER — Emergency Department: Payer: BLUE CROSS/BLUE SHIELD

## 2017-02-20 ENCOUNTER — Encounter: Payer: Self-pay | Admitting: Emergency Medicine

## 2017-02-20 DIAGNOSIS — R0602 Shortness of breath: Secondary | ICD-10-CM

## 2017-02-20 DIAGNOSIS — R42 Dizziness and giddiness: Secondary | ICD-10-CM

## 2017-02-20 DIAGNOSIS — J45909 Unspecified asthma, uncomplicated: Secondary | ICD-10-CM | POA: Diagnosis not present

## 2017-02-20 LAB — URINALYSIS, COMPLETE (UACMP) WITH MICROSCOPIC
Bilirubin Urine: NEGATIVE
Glucose, UA: NEGATIVE mg/dL
Hgb urine dipstick: NEGATIVE
Ketones, ur: NEGATIVE mg/dL
Leukocytes, UA: NEGATIVE
Nitrite: NEGATIVE
Protein, ur: NEGATIVE mg/dL
Specific Gravity, Urine: 1.018 (ref 1.005–1.030)
pH: 5 (ref 5.0–8.0)

## 2017-02-20 LAB — BASIC METABOLIC PANEL
Anion gap: 7 (ref 5–15)
BUN: 16 mg/dL (ref 6–20)
CO2: 25 mmol/L (ref 22–32)
Calcium: 9.3 mg/dL (ref 8.9–10.3)
Chloride: 105 mmol/L (ref 101–111)
Creatinine, Ser: 0.72 mg/dL (ref 0.44–1.00)
GFR calc Af Amer: >60 mL/min (ref >60)
GFR calc non Af Amer: >60 mL/min (ref >60)
Glucose, Bld: 107 mg/dL — ABNORMAL HIGH (ref 65–99)
Potassium: 3.5 mmol/L (ref 3.5–5.1)
Sodium: 137 mmol/L (ref 135–145)

## 2017-02-20 LAB — CBC
HCT: 33.7 % — ABNORMAL LOW (ref 35.0–47.0)
Hemoglobin: 11.1 g/dL — ABNORMAL LOW (ref 12.0–16.0)
MCH: 26.4 pg (ref 26.0–34.0)
MCHC: 32.9 g/dL (ref 32.0–36.0)
MCV: 80.2 fL (ref 80.0–100.0)
Platelets: 225 10*3/uL (ref 150–440)
RBC: 4.21 MIL/uL (ref 3.80–5.20)
RDW: 14.4 % (ref 11.5–14.5)
WBC: 5.7 10*3/uL (ref 3.6–11.0)

## 2017-02-20 LAB — TROPONIN I: Troponin I: <0.03 ng/mL (ref <0.03)

## 2017-02-20 LAB — PREGNANCY, URINE: Preg Test, Ur: NEGATIVE

## 2017-02-20 MED ORDER — SODIUM CHLORIDE 0.9 % IV BOLUS (SEPSIS)
1000.0000 mL | Freq: Once | INTRAVENOUS | Status: AC
  Administered 2017-02-20: 1000 mL via INTRAVENOUS

## 2017-02-20 NOTE — ED Provider Notes (Signed)
[facility] Emergency Department Provider Note   ____________________________________________   First MD Initiated Contact with Patient 02/20/17 [phone]     (approximate)  I have reviewed the triage vital signs and the nursing notes.   HISTORY  Chief Complaint Shortness of Breath; Dizziness; Tachycardia; and Emesis    HPI Jasmine Gordon is a 31 y.o. female who comes into the hospital today with lightheadedness. She reports that she felt that way most of the day. She reports that she worked all day and then when she went home and laid down to sleep she felt as though her heart was racing. He was feeling jittery as if she had drank too much caffeine but she reports she had none. She said her stomach felt: She did have an episode of vomiting. She was not a lot. He said that she just feels little woozy. The patient was concerned that she may have some food poisoning from eating uncooked chicken. His had some shortness of breath as well. When her stomach feels like is going to turn she states that her head hurts as well. Patient reports that she didn't eat much this morning but she did drink adequately during the day. The patient denies any chest pain sweats diarrhea. The patient is here today for evaluation.   Past Medical History:  Diagnosis Date  . Allergy   . Anemia   . Anxiety   . Asthma   . Bronchiolitis   . Depression   . Migraine   . Panic attack   . PTSD (post-traumatic stress disorder) 2016    Patient Active Problem List   Diagnosis Date Noted  . Allergic reaction 02/08/2017  . Urticaria due to food allergy 02/08/2017  . Vitamin D deficiency 11/08/2016  . Iron deficiency anemia due to chronic blood loss 10/03/2016  . Microcytic anemia 07/15/2016  . Menorrhagia with regular cycle 06/27/2016  . Mild persistent asthma 06/17/2016  . Anxiety and depression 06/17/2016  . BMI 38.0-38.9,adult 06/17/2016    Past Surgical History:  Procedure  Laterality Date  . APPENDECTOMY    . Plantar warts      Prior to Admission medications   Medication Sig Start Date End Date Taking? Authorizing Provider  albuterol (PROVENTIL HFA;VENTOLIN HFA) 108 (90 Base) MCG/ACT inhaler Inhale 2 puffs into the lungs every 6 (six) hours as needed for wheezing or shortness of breath. 06/17/16   Amy Rusty Aus, NP  albuterol (PROVENTIL) (2.5 MG/3ML) 0.083% nebulizer solution Take 3 mLs (2.5 mg total) by nebulization every 6 (six) hours as needed for wheezing or shortness of breath. 11/11/16   [name], DO  EPINEPHrine 0.3 mg/0.3 mL IJ SOAJ injection INJECT INTRAMUSCULARLY AS DIRECTED 01/29/17   Historical Provider, MD  FLOVENT HFA 220 MCG/ACT inhaler  06/22/16   Historical Provider, MD  ondansetron (ZOFRAN ODT) 4 MG disintegrating tablet Take 1 tablet (4 mg total) by mouth every 8 (eight) hours as needed for nausea or vomiting. 12/28/16   Irean Hong, MD  predniSONE (DELTASONE) 10 MG tablet Take 6 tablets  today, on day 2 take 5 tablets, day 3 take 4 tablets, day 4 take 3 tablets, day 5 take  2 tablets and 1 tablet the last day 01/29/17   Tommi Rumps, PA-C  ranitidine (ZANTAC) 150 MG tablet Take 1 tablet (150 mg total) by mouth 2 (two) times daily. 02/11/17 02/11/18  Enid Derry, PA-C    Allergies Asa [aspirin]; Reglan [metoclopramide]; and Turmeric  Family History  Problem Relation Age of Onset  . Depression Mother   . Mental retardation Mother   . Bipolar disorder Mother   . Diabetes Father   . Stroke Maternal Grandfather   . Diabetes Maternal Grandfather     Social History Social History  Substance Use Topics  . Smoking status: Never Smoker  . Smokeless tobacco: Never Used  . Alcohol use No    Review of Systems Constitutional: No fever/chills Eyes: No visual changes. ENT: No sore throat. Cardiovascular: palpitations Respiratory:  shortness of breath. Gastrointestinal: No abdominal pain.  No nausea, no vomiting.  No  diarrhea.  No constipation. Genitourinary: Negative for dysuria. Musculoskeletal: Negative for back pain. Skin: Negative for rash. Neurological: dizziness  10-point ROS otherwise negative.  ____________________________________________   PHYSICAL EXAM:  VITAL SIGNS: ED Triage Vitals  Enc Vitals Group     BP 02/20/17 0216 117/63     Pulse Rate 02/20/17 0216 80     Resp 02/20/17 0216 18     Temp 02/20/17 0216 98.2 F (36.8 C)     Temp Source 02/20/17 0216 Oral     SpO2 02/20/17 0216 100 %     Weight 02/20/17 0217 249 lb (112.9 kg)     Height 02/20/17 0217  (1.702 m)     Head Circumference --      Peak Flow --      Pain Score --      Pain Loc --      Pain Edu? --      Excl. in GC? --     Constitutional: Alert and oriented. Well appearing and in mild distress. Eyes: Conjunctivae are normal. PERRL. EOMI. Head: Atraumatic. Nose: No congestion/rhinnorhea. Mouth/Throat: Mucous membranes are moist.  Oropharynx non-erythematous. Cardiovascular: Normal rate, regular rhythm. Grossly normal heart sounds.  Good peripheral circulation. Respiratory: Normal respiratory effort.  No retractions. Lungs CTAB. Gastrointestinal: Soft and nontender. No distention. Positive bowel sounds Musculoskeletal: No lower extremity tenderness nor edema.   Neurologic:  Normal speech and language. Cranial nerves II through XII are grossly intact with no focal motor or neuro deficits Skin:  Skin is warm, dry and intact. No rash noted. Psychiatric: Mood and affect are normal.   ____________________________________________   LABS (all labs ordered are listed, but only abnormal results are displayed)  Labs Reviewed  BASIC METABOLIC PANEL - Abnormal; Notable for the following:       Result Value   Glucose, Bld 107 (*)    All other components within normal limits  CBC - Abnormal; Notable for the following:    Hemoglobin 11.1 (*)    HCT 33.7 (*)    All other components within normal limits    URINALYSIS, COMPLETE (UACMP) WITH MICROSCOPIC - Abnormal; Notable for the following:    Color, Urine YELLOW (*)    APPearance HAZY (*)    Bacteria, UA RARE (*)    Squamous Epithelial / LPF 6-30 (*)    All other components within normal limits  TROPONIN I  PREGNANCY, URINE   ____________________________________________  EKG  ED ECG REPORT I, Rebecka Apley, the attending physician, personally viewed and interpreted this ECG.   Date: 02/20/2017  EKG Time: 218  Rate: 96  Rhythm: normal sinus rhythm  Axis: normal  Intervals:none  ST&T Change: none  ____________________________________________  RADIOLOGY  CXR ____________________________________________   PROCEDURES  Procedure(s) performed: None  Procedures  Critical Care performed: No  ____________________________________________   INITIAL IMPRESSION / ASSESSMENT AND PLAN / ED COURSE  Pertinent labs & imaging results that were available during my care of the patient were reviewed by me and considered in my medical decision making (see chart for details).  This is a 31 year old who comes into the hospital today feeling lightheaded. She reports that she has eaten and drank today but she just hasn't felt really well. She did vomit once and had the urge is that she'll have a bowel movement. The patient is concerned that she may have eaten some raw chicken. I did check the patient's blood work is unremarkable. She does not have any UTI she is not pregnant nor does she have any electric abnormalities. She did have some mild tachycardia when she arrived back at the patient liter of normal saline and her tachycardia has improved. It is possible that the patient may be developing a stomach bug which is causing her to feel unwell. I will give her a work note to give her some time off and have encouraged her to stay hydrated and to eat as she is able to. The patient should follow-up with her primary care physician for further  evaluation.  Clinical Course as of Feb 20 805  Mon Feb 20, 2017  0523 Normal chest DG Chest 2 View [AW]    Clinical Course User Index [AW] Rebecka Apley, MD     ____________________________________________   FINAL CLINICAL IMPRESSION(S) / ED DIAGNOSES  Final diagnoses:  Dizziness  Shortness of breath      NEW MEDICATIONS STARTED DURING THIS VISIT:  Discharge Medication List as of 02/20/2017  7:55 AM       Note:  This document was prepared using Dragon voice recognition software and may include unintentional dictation errors.    Rebecka Apley, MD 02/20/17 [phone]

## 2017-02-20 NOTE — ED Triage Notes (Signed)
Patient states that she started feeling dizzy yesterday morning. Patient reports that tonight when she laid down she started feeling short of breath and felt like her heart was racing. Patient states the patient vomited times one.

## 2017-02-20 NOTE — Discharge Instructions (Signed)
Please follow up with your primary care physician.

## 2017-02-20 NOTE — ED Notes (Signed)
Pt ambulated to restroom without difficulty. NAD. No needs.

## 2017-02-20 NOTE — ED Notes (Signed)
IV to right AC removed. Placed prior to this RN arrival. Catheter intact.

## 2017-02-24 ENCOUNTER — Other Ambulatory Visit: Payer: Self-pay | Admitting: Family Medicine

## 2017-02-24 DIAGNOSIS — R7309 Other abnormal glucose: Secondary | ICD-10-CM

## 2017-02-24 DIAGNOSIS — E559 Vitamin D deficiency, unspecified: Secondary | ICD-10-CM

## 2017-02-25 LAB — VITAMIN D 25 HYDROXY (VIT D DEFICIENCY, FRACTURES): Vit D, 25-Hydroxy: 15 ng/mL — ABNORMAL LOW (ref 30–100)

## 2017-02-25 LAB — HEMOGLOBIN A1C
Hgb A1c MFr Bld: 5.7 % — ABNORMAL HIGH (ref <5.7)
Mean Plasma Glucose: 117 mg/dL

## 2017-02-27 ENCOUNTER — Encounter: Payer: Self-pay | Admitting: Family Medicine

## 2017-02-28 ENCOUNTER — Ambulatory Visit: Payer: BLUE CROSS/BLUE SHIELD | Admitting: Nurse Practitioner

## 2017-02-28 ENCOUNTER — Ambulatory Visit: Payer: Self-pay | Admitting: Family Medicine

## 2017-03-04 ENCOUNTER — Encounter: Payer: Self-pay | Admitting: Family Medicine

## 2017-03-06 ENCOUNTER — Encounter: Payer: Self-pay | Admitting: Family Medicine

## 2017-03-06 ENCOUNTER — Ambulatory Visit (INDEPENDENT_AMBULATORY_CARE_PROVIDER_SITE_OTHER): Payer: BLUE CROSS/BLUE SHIELD | Admitting: Family Medicine

## 2017-03-06 ENCOUNTER — Other Ambulatory Visit: Payer: Self-pay | Admitting: Family Medicine

## 2017-03-06 VITALS — BP 112/65 | HR 102 | Temp 98.6°F | Resp 16 | Ht 67.0 in | Wt 254.4 lb

## 2017-03-06 DIAGNOSIS — E559 Vitamin D deficiency, unspecified: Secondary | ICD-10-CM

## 2017-03-06 DIAGNOSIS — R7303 Prediabetes: Secondary | ICD-10-CM

## 2017-03-06 DIAGNOSIS — Z6838 Body mass index (BMI) 38.0-38.9, adult: Secondary | ICD-10-CM | POA: Diagnosis not present

## 2017-03-06 DIAGNOSIS — E7800 Pure hypercholesterolemia, unspecified: Secondary | ICD-10-CM

## 2017-03-06 DIAGNOSIS — E785 Hyperlipidemia, unspecified: Secondary | ICD-10-CM | POA: Insufficient documentation

## 2017-03-06 DIAGNOSIS — D500 Iron deficiency anemia secondary to blood loss (chronic): Secondary | ICD-10-CM

## 2017-03-06 DIAGNOSIS — Z0000 Encounter for general adult medical examination without abnormal findings: Secondary | ICD-10-CM

## 2017-03-06 MED ORDER — METFORMIN HCL ER 500 MG PO TB24: 500.0000 mg | ORAL_TABLET | Freq: Every day | ORAL | 5 refills | Status: DC

## 2017-03-06 NOTE — Assessment & Plan Note (Signed)
Remains low Vitamin D 15 Intolerance to Vitamin D 50k weekly due to headache migraine - Advised to switch to add Vitamin D3 2,000 unit daily in addition to MVI with 800iu daily - Re-check Vitamin D with upcoming labs 3 months

## 2017-03-06 NOTE — Patient Instructions (Signed)
Thank you for coming in to clinic today.  1. A1c 5.7, this is in the Pre-Diabetes range - I do think your elevated blood sugar is contributing to weight - Start Metformin extended release (XR)  daily with breakfast - potential side effect for some upset stomach and diarrhea, this is better over time and the extended release will be better as well, if we need to increase dose in future then we can adjust it  Keep working on Diabetic diet meal plans and improve regular exercise  Vitamin D is remaining low, at 15, continue taking Vitamin D 800 unit daily + I would add the Vitamin D3 2,000 unit daily for maintenance  You will be due for FASTING BLOOD WORK (no food or drink after midnight before, only water or coffee without cream/sugar on the morning of)  - Please go ahead and schedule a "Lab Only" visit in the morning at the clinic for lab draw in 3 months (after 05/26/17) before next Annual Physical - Make sure Lab Only appointment is at least 1-2 weeks before your next appointment, so that results will be available  For Lab Results, once available within 2-3 days of blood draw, you can can log in to MyChart online to view your results and a brief explanation. Also, we can discuss results at next follow-up visit.  Please schedule a follow-up appointment with Dr. Althea Charon in 3 months for Annual Physical  If you have any other questions or concerns, please feel free to call the clinic or send a message through MyChart. You may also schedule an earlier appointment if necessary.  [name], DO [facility], [place]

## 2017-03-06 NOTE — Progress Notes (Signed)
Subjective:    Patient ID: Jasmine Gordon, female    DOB: 13-Aug-1986, 31 y.o.   MRN: [id number]  Jasmine Gordon is a 31 y.o. female presenting on 03/06/2017 for Follow-up (lab work)   HPI   Pre-Diabetes / Weight Management Reports concern with newly elevated A1c now 5.7 (previously 5.6 or lower). She was unable to get Contrave approved by insurance previously due to gaining weight instead of losing weight, did not lose 5% on medication. Also states she did not take Contrave and held it while had prior allergic reactions, therefore she thinks did not take it regularly - She has tried to improve lifestyle with diet and exercise but still has difficulty losing weight Meds: None - never on diabetic medications Lifestyle: Diet (increased vegetables, greens, reducing carbs, following Diabetic diet meal planning guide, improved water, less sweet drinks now mostly no calorie drinks, increased whole grain) / Exercise (regular exercise working out daily at gym, plans to walk outside more with warmer weather) Denies hypoglycemia, polyuria, visual changes, numbness or tingling.  Vitamin D Deficiency - Prior lab values with Vitamin D 17 > 15 > 15, has not done well with vitamin D replacement therapy, in past tried Vitamin D 50k weekly but had migraine headaches with it. Has not tried 2,000 or 5,000 dose. Now taking OTC vitamin supplement included 800iu Vitamin D daily, tolerating well, and she is already starting to feel better with more energy  Additionally - established with Allergist now, and had extensive allergy testing, but labs and testing did not confirm any specific food allergy including shrimp. She was told to follow-up with them if another allergic reaction, they can offer monitored food allergy testing  Social History  Substance Use Topics  . Smoking status: Never Smoker  . Smokeless tobacco: Never Used  . Alcohol use No    Review of Systems Per HPI unless specifically  indicated above     Objective:    BP 112/65   Pulse (!) 102   Temp 98.6 F (37 C) (Oral)   Resp 16   Ht  (1.702 m)   Wt 254 lb 6.4 oz (115.4 kg)   BMI 39.84 kg/m   Wt Readings from Last 3 Encounters:  03/06/17 254 lb 6.4 oz (115.4 kg)  02/20/17 249 lb (112.9 kg)  02/11/17 252 lb 8 oz (114.5 kg)    Physical Exam  Constitutional: She appears well-developed and well-nourished. No distress.  Well-appearing, comfortable, cooperative, obese  HENT:  Head: Normocephalic and atraumatic.  Mouth/Throat: Oropharynx is clear and moist.  Eyes: Conjunctivae are normal.  Neck: Normal range of motion. Neck supple.  Cardiovascular: Normal rate, regular rhythm, normal heart sounds and intact distal pulses.   No murmur heard. Pulmonary/Chest: Effort normal.  Skin: Skin is warm and dry. No rash noted. She is not diaphoretic. No erythema.  Psychiatric: She has a normal mood and affect. Her behavior is normal.  Nursing note and vitals reviewed.    I have personally reviewed the following lab results from 02/24/17.  Results for orders placed or performed in visit on 02/24/17  Hemoglobin A1c  Result Value Ref Range   Hgb A1c MFr Bld 5.7 (H) <5.7 %   Mean Plasma Glucose 117 mg/dL  VITAMIN D 25 Hydroxy (Vit-D Deficiency, Fractures)  Result Value Ref Range   Vit D, 25-Hydroxy 15 (L) 30 - 100 ng/mL      Assessment & Plan:   Problem List Items Addressed This Visit  Vitamin D deficiency    Remains low Vitamin D 15 Intolerance to Vitamin D 50k weekly due to headache migraine - Advised to switch to add Vitamin D3 2,000 unit daily in addition to MVI with 800iu daily - Re-check Vitamin D with upcoming labs 3 months      Pre-diabetes - Primary    Elevated A1c now new dx Pre-DM A1c 5.7, likely impaired fasting glucose is contributing to her weight gain - No known complications  Plan: 1. Discussion today on weight gain and management of Pre-DM, agree to hold off on re-appealing the  Contrave rx since not entirely effective, will try to control sugars and start Pre-DM medication with Metformin 2. Start Metformin XR  daily with food, reviewed potential side effects 3. Encouraged to improve DM diet, low carb, low sugar, improve regular exercise, work on lifestyle 4. Follow-up 3 months for future labs including A1c - Annual Physical      Relevant Medications   metFORMIN (GLUCOPHAGE-XR) 500 MG 24 hr tablet      Meds ordered this encounter  Medications  .       .           .           .           . metFORMIN (GLUCOPHAGE-XR) 500 MG 24 hr tablet    Sig: Take 1 tablet (500 mg total) by mouth daily with breakfast.    Dispense:  30 tablet    Refill:  5    Follow up plan: Return in about 3 months (around 06/05/2017) for Annual Physical.  [name], DO [facility] 03/06/2017, 10:34 PM

## 2017-03-06 NOTE — Assessment & Plan Note (Signed)
Elevated A1c now new dx Pre-DM A1c 5.7, likely impaired fasting glucose is contributing to her weight gain - No known complications  Plan: 1. Discussion today on weight gain and management of Pre-DM, agree to hold off on re-appealing the Contrave rx since not entirely effective, will try to control sugars and start Pre-DM medication with Metformin 2. Start Metformin XR  daily with food, reviewed potential side effects 3. Encouraged to improve DM diet, low carb, low sugar, improve regular exercise, work on lifestyle 4. Follow-up 3 months for future labs including A1c - Annual Physical

## 2017-03-06 NOTE — Assessment & Plan Note (Signed)
Weight gain now, and elevated A1c now new dx Pre-DM A1c 5.7, likely impaired fasting glucose is contributing to her weight gain  Plan: 1. Discussion today on weight gain and management of Pre-DM, agree to hold off on re-appealing the Contrave rx since not entirely effective, will try to control sugars and start Pre-DM medication with Metformin 2. Start Metformin XR  daily with food, reviewed potential side effects 3. Encouraged to improve DM diet, low carb, low sugar, improve regular exercise, work on lifestyle 4. Follow-up 3 months for future labs including A1c - Annual Physical, patient may re-schedule [facility] in futur eand consider [facility] for other weight management if interested

## 2017-03-11 ENCOUNTER — Emergency Department
Admission: EM | Admit: 2017-03-11 | Discharge: 2017-03-11 | Disposition: A | Discharge status: Home / Self Care | Payer: BLUE CROSS/BLUE SHIELD | Attending: Emergency Medicine | Admitting: Emergency Medicine

## 2017-03-11 ENCOUNTER — Emergency Department: Payer: BLUE CROSS/BLUE SHIELD

## 2017-03-11 ENCOUNTER — Encounter: Payer: Self-pay | Admitting: Emergency Medicine

## 2017-03-11 DIAGNOSIS — R079 Chest pain, unspecified: Secondary | ICD-10-CM

## 2017-03-11 DIAGNOSIS — Z79899 Other long term (current) drug therapy: Secondary | ICD-10-CM | POA: Diagnosis not present

## 2017-03-11 DIAGNOSIS — J45909 Unspecified asthma, uncomplicated: Secondary | ICD-10-CM | POA: Insufficient documentation

## 2017-03-11 DIAGNOSIS — R0789 Other chest pain: Secondary | ICD-10-CM | POA: Diagnosis present

## 2017-03-11 DIAGNOSIS — Z7984 Long term (current) use of oral hypoglycemic drugs: Secondary | ICD-10-CM | POA: Insufficient documentation

## 2017-03-11 DIAGNOSIS — R0602 Shortness of breath: Secondary | ICD-10-CM | POA: Insufficient documentation

## 2017-03-11 LAB — CBC
HCT: 35.5 % (ref 35.0–47.0)
Hemoglobin: 11.3 g/dL — ABNORMAL LOW (ref 12.0–16.0)
MCH: 25.7 pg — ABNORMAL LOW (ref 26.0–34.0)
MCHC: 31.7 g/dL — ABNORMAL LOW (ref 32.0–36.0)
MCV: 81.2 fL (ref 80.0–100.0)
Platelets: 222 10*3/uL (ref 150–440)
RBC: 4.37 MIL/uL (ref 3.80–5.20)
RDW: 14.2 % (ref 11.5–14.5)
WBC: 4.9 10*3/uL (ref 3.6–11.0)

## 2017-03-11 LAB — BASIC METABOLIC PANEL
Anion gap: 6 (ref 5–15)
BUN: 16 mg/dL (ref 6–20)
CO2: 25 mmol/L (ref 22–32)
Calcium: 9.3 mg/dL (ref 8.9–10.3)
Chloride: 105 mmol/L (ref 101–111)
Creatinine, Ser: 0.76 mg/dL (ref 0.44–1.00)
GFR calc Af Amer: >60 mL/min (ref >60)
GFR calc non Af Amer: >60 mL/min (ref >60)
Glucose, Bld: 88 mg/dL (ref 65–99)
Potassium: 3.7 mmol/L (ref 3.5–5.1)
Sodium: 136 mmol/L (ref 135–145)

## 2017-03-11 LAB — TROPONIN I: Troponin I: <0.03 ng/mL (ref <0.03)

## 2017-03-11 LAB — POCT PREGNANCY, URINE: Preg Test, Ur: NEGATIVE

## 2017-03-11 MED ORDER — GI COCKTAIL ~~LOC~~
30.0000 mL | Freq: Once | ORAL | Status: AC
  Administered 2017-03-11: 30 mL via ORAL
  Filled 2017-03-11: qty 30

## 2017-03-11 NOTE — ED Notes (Signed)
E-signature pad not working, pt verbalized [employer]

## 2017-03-11 NOTE — ED Provider Notes (Signed)
[facility] Emergency Department Provider Note   ____________________________________________   I have reviewed the triage vital signs and the nursing notes.   HISTORY  Chief Complaint Chest Pain and Shortness of Breath   History limited by: Not Limited   HPI Jasmine Gordon is a 31 y.o. female who presents to the emergency department today because of concerns for chest pain. Is been going on for the past 2 days. It is located in the central chest. It is pressure-like. It has been constant. Patient feels some relief with sitting straight up. Patient denies any nausea or vomiting. No shortness breath. No fevers.   Past Medical History:  Diagnosis Date  . Allergy   . Anemia   . Anxiety   . Asthma   . Bronchiolitis   . Depression   . Migraine   . Panic attack   . PTSD (post-traumatic stress disorder) 2016    Patient Active Problem List   Diagnosis Date Noted  . Pre-diabetes 03/06/2017  . Hyperlipidemia 03/06/2017  . Allergic reaction 02/08/2017  . Urticaria due to food allergy 02/08/2017  . Vitamin D deficiency 11/08/2016  . Iron deficiency anemia due to chronic blood loss 10/03/2016  . Microcytic anemia 07/15/2016  . Menorrhagia with regular cycle 06/27/2016  . Mild persistent asthma 06/17/2016  . Anxiety and depression 06/17/2016  . BMI 38.0-38.9,adult 06/17/2016    Past Surgical History:  Procedure Laterality Date  . APPENDECTOMY    . Plantar warts      Prior to Admission medications   Medication Sig Start Date End Date Taking? Authorizing Provider  albuterol (PROVENTIL HFA;VENTOLIN HFA) 108 (90 Base) MCG/ACT inhaler Inhale 2 puffs into the lungs every 6 (six) hours as needed for wheezing or shortness of breath. 06/17/16   Amy Rusty Aus, NP  albuterol (PROVENTIL) (2.5 MG/3ML) 0.083% nebulizer solution Take 3 mLs (2.5 mg total) by nebulization every 6 (six) hours as needed for wheezing or shortness of breath. 11/11/16   [name], DO  azelastine (OPTIVAR) 0.05 % ophthalmic solution  02/28/17   Historical Provider, MD  BREO ELLIPTA 100-25 MCG/INH AEPB INL 1 PUFF PO QD 02/28/17   Historical Provider, MD  EPINEPHrine 0.3 mg/0.3 mL IJ SOAJ injection INJECT INTRAMUSCULARLY AS DIRECTED 01/29/17   Historical Provider, MD  FLOVENT HFA 220 MCG/ACT inhaler  06/22/16   Historical Provider, MD  fluticasone (FLONASE) 50 MCG/ACT nasal spray SHAKE LQ AND U 1 TO 2 SPRAYS IEN QD 02/28/17   Historical Provider, MD  metFORMIN (GLUCOPHAGE-XR) 500 MG 24 hr tablet Take 1 tablet (500 mg total) by mouth daily with breakfast. 03/06/17   [name], DO  montelukast (SINGULAIR) 10 MG tablet TK 1 T PO QD IN THE EVE 02/28/17   Historical Provider, MD  ondansetron (ZOFRAN ODT) 4 MG disintegrating tablet Take 1 tablet (4 mg total) by mouth every 8 (eight) hours as needed for nausea or vomiting. 12/28/16   Irean Hong, MD  ranitidine (ZANTAC) 150 MG tablet Take 1 tablet (150 mg total) by mouth 2 (two) times daily. 02/11/17 02/11/18  Enid Derry, PA-C    Allergies Asa [aspirin]; Reglan [metoclopramide]; and Turmeric  Family History  Problem Relation Age of Onset  . Depression Mother   . Mental retardation Mother   . Bipolar disorder Mother   . Diabetes Father   . Stroke Maternal Grandfather   . Diabetes Maternal Grandfather     Social History Social History  Substance Use Topics  .  Smoking status: Never Smoker  . Smokeless tobacco: Never Used  . Alcohol use No    Review of Systems  Constitutional: Negative for fever. Cardiovascular: Positive for chest pain. Respiratory: Negative for shortness of breath. Gastrointestinal: Negative for abdominal pain, vomiting and diarrhea. Genitourinary: Negative for dysuria. Musculoskeletal: Negative for back pain. Skin: Negative for rash. Neurological: Negative for headaches, focal weakness or numbness.  10-point ROS otherwise  negative.  ____________________________________________   PHYSICAL EXAM:  VITAL SIGNS: ED Triage Vitals  Enc Vitals Group     BP 03/11/17 1250 127/75     Pulse Rate 03/11/17 1250 78     Resp 03/11/17 1250 (!) 85     Temp 03/11/17 1247 98.8 F (37.1 C)     Temp Source 03/11/17 1247 Oral     SpO2 03/11/17 1250 100 %     Weight 03/11/17 1250 251 lb (113.9 kg)     Height 03/11/17 1250  (1.702 m)     Head Circumference --      Peak Flow --      Pain Score 03/11/17 1246 6   Constitutional: Alert and oriented. Well appearing and in no distress. Eyes: Conjunctivae are normal. Normal extraocular movements. ENT   Head: Normocephalic and atraumatic.   Nose: No congestion/rhinnorhea.   Mouth/Throat: Mucous membranes are moist.   Neck: No stridor. Hematological/Lymphatic/Immunilogical: No cervical lymphadenopathy. Cardiovascular: Normal rate, regular rhythm.  No murmurs, rubs, or gallops.  Respiratory: Normal respiratory effort without tachypnea nor retractions. Breath sounds are clear and equal bilaterally. No wheezes/rales/rhonchi. Gastrointestinal: Soft and non tender. No rebound. No guarding.  Genitourinary: Deferred Musculoskeletal: Normal range of motion in all extremities. No lower extremity edema. Neurologic:  Normal speech and language. No gross focal neurologic deficits are appreciated.  Skin:  Skin is warm, dry and intact. No rash noted. Psychiatric: Mood and affect are normal. Speech and behavior are normal. Patient exhibits appropriate insight and judgment.  ____________________________________________    LABS (pertinent positives/negatives)  Labs Reviewed  CBC - Abnormal; Notable for the following:       Result Value   Hemoglobin 11.3 (*)    MCH 25.7 (*)    MCHC 31.7 (*)    All other components within normal limits  BASIC METABOLIC PANEL  TROPONIN I  POC URINE PREG, ED  POCT PREGNANCY, URINE      ____________________________________________   EKG  I, Phineas Semen, attending physician, personally viewed and interpreted this EKG  EKG Time: 1247 Rate: 94 Rhythm: normal sinus rhythm Axis: normal Intervals: qtc 452 QRS: narrow ST changes: no st elevation, t wave inversion V1 Impression: abnormal ekg   ____________________________________________    RADIOLOGY  CXR  IMPRESSION: Normal chest radiographs.  ____________________________________________   PROCEDURES  Procedures  ____________________________________________   INITIAL IMPRESSION / ASSESSMENT AND PLAN / ED COURSE  Pertinent labs & imaging results that were available during my care of the patient were reviewed by me and considered in my medical decision making (see chart for details).  Patient presented to the emergency department today because of concerns for chest pain. EKG, chest x-ray and blood work without any acute findings. I would expect some elevation in troponin if it represented ACS given length of symptoms. Will plan on discharging home to follow up with PCP.  ____________________________________________   FINAL CLINICAL IMPRESSION(S) / ED DIAGNOSES  Final diagnoses:  Nonspecific chest pain     Note: This dictation was prepared with Dragon dictation. Any transcriptional errors that result from this  process are unintentional     Phineas Semen, MD 03/11/17 1537
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
## 2017-03-11 NOTE — Discharge Instructions (Signed)
Please seek medical attention for any high fevers, chest pain, shortness of breath, change in behavior, persistent vomiting, bloody stool or any other new or concerning symptoms.  

## 2017-03-17 ENCOUNTER — Encounter: Payer: Self-pay | Admitting: Family Medicine

## 2017-03-27 DIAGNOSIS — Z79899 Other long term (current) drug therapy: Secondary | ICD-10-CM | POA: Insufficient documentation

## 2017-03-27 DIAGNOSIS — Z7984 Long term (current) use of oral hypoglycemic drugs: Secondary | ICD-10-CM | POA: Diagnosis not present

## 2017-03-27 DIAGNOSIS — J45909 Unspecified asthma, uncomplicated: Secondary | ICD-10-CM | POA: Diagnosis not present

## 2017-03-27 DIAGNOSIS — T7840XA Allergy, unspecified, initial encounter: Secondary | ICD-10-CM | POA: Diagnosis not present

## 2017-03-27 DIAGNOSIS — R0602 Shortness of breath: Secondary | ICD-10-CM | POA: Diagnosis present

## 2017-03-27 NOTE — ED Triage Notes (Signed)
Pt was cleaning at work and there was a lot of pollen and dust, states she became very congested. Has hx of asthma and states felt shob, none noted at present. Pt co facial pressure at this time, states has been sneezing.

## 2017-03-28 ENCOUNTER — Emergency Department
Admission: EM | Admit: 2017-03-28 | Discharge: 2017-03-28 | Disposition: A | Discharge status: Home / Self Care | Payer: BLUE CROSS/BLUE SHIELD | Attending: Emergency Medicine | Admitting: Emergency Medicine

## 2017-03-28 ENCOUNTER — Ambulatory Visit: Payer: BLUE CROSS/BLUE SHIELD | Admitting: Family Medicine

## 2017-03-28 ENCOUNTER — Telehealth: Payer: Self-pay | Admitting: Nurse Practitioner

## 2017-03-28 DIAGNOSIS — R0602 Shortness of breath: Secondary | ICD-10-CM

## 2017-03-28 DIAGNOSIS — Z9109 Other allergy status, other than to drugs and biological substances: Secondary | ICD-10-CM

## 2017-03-28 MED ORDER — DIPHENHYDRAMINE HCL 25 MG PO CAPS: 25.0000 mg | ORAL_CAPSULE | Freq: Once | ORAL | Status: DC

## 2017-03-28 MED ORDER — FLUTICASONE PROPIONATE 50 MCG/ACT NA SUSP: 1.0000 | Freq: Every day | NASAL | 0 refills | Status: DC

## 2017-03-28 MED ORDER — BUTALBITAL-APAP-CAFFEINE 50-325-40 MG PO TABS
2.0000 | ORAL_TABLET | Freq: Once | ORAL | Status: AC
  Administered 2017-03-28: 2 via ORAL
  Filled 2017-03-28: qty 2

## 2017-03-28 MED ORDER — LORATADINE 10 MG PO TABS: 10.0000 mg | ORAL_TABLET | Freq: Every day | ORAL | 0 refills | Status: DC

## 2017-03-28 MED ORDER — LORATADINE 10 MG PO TABS
10.0000 mg | ORAL_TABLET | Freq: Once | ORAL | Status: AC
  Administered 2017-03-28: 10 mg via ORAL
  Filled 2017-03-28: qty 1

## 2017-03-28 NOTE — ED Provider Notes (Signed)
[facility] Emergency Department Provider Note   ____________________________________________   First MD Initiated Contact with Patient 03/28/17 0245     (approximate)  I have reviewed the triage vital signs and the nursing notes.   HISTORY  Chief Complaint Shortness of Breath    HPI Jasmine Gordon is a 31 y.o. female who comes into the hospital today with some scratchy throat sneezing and shortness of breath. The patient reports that she was cleaning the windows outside and developed some sneezing, headache and scratchy throat. The patient reports that she didn't take any medication for it as she didn't know what it was. The patient reports that she decided to come in to get checked out. The patient reports that she cleaned outside around 2:58 PM. The patient reports that she felt like she couldn't breathe. She did take her inhaler and her breathing got better. She has been prescribed medicine by her allergy doctor but she thought it was too far gone to take her medicine. She rates her head pain and 9 out of 10 in intensity and has some facial pain with nasal congestion. The patient is here today for evaluation.   Past Medical History:  Diagnosis Date  . Allergy   . Anemia   . Anxiety   . Asthma   . Bronchiolitis   . Depression   . Migraine   . Panic attack   . PTSD (post-traumatic stress disorder) 2016    Patient Active Problem List   Diagnosis Date Noted  . Pre-diabetes 03/06/2017  . Hyperlipidemia 03/06/2017  . Allergic reaction 02/08/2017  . Urticaria due to food allergy 02/08/2017  . Vitamin D deficiency 11/08/2016  . Iron deficiency anemia due to chronic blood loss 10/03/2016  . Microcytic anemia 07/15/2016  . Menorrhagia with regular cycle 06/27/2016  . Mild persistent asthma 06/17/2016  . Anxiety and depression 06/17/2016  . BMI 38.0-38.9,adult 06/17/2016    Past Surgical History:  Procedure Laterality Date  . APPENDECTOMY     . Plantar warts      Prior to Admission medications   Medication Sig Start Date End Date Taking? Authorizing Provider  albuterol (PROVENTIL HFA;VENTOLIN HFA) 108 (90 Base) MCG/ACT inhaler Inhale 2 puffs into the lungs every 6 (six) hours as needed for wheezing or shortness of breath. 06/17/16   Krebs, Laurel Dimmer, NP  albuterol (PROVENTIL) (2.5 MG/3ML) 0.083% nebulizer solution Take 3 mLs (2.5 mg total) by nebulization every 6 (six) hours as needed for wheezing or shortness of breath. 11/11/16   Karamalegos, Netta Neat, DO  azelastine (OPTIVAR) 0.05 % ophthalmic solution  02/28/17   [provider]  BREO ELLIPTA 100-25 MCG/INH AEPB INL 1 PUFF PO QD 02/28/17   [provider]  EPINEPHrine 0.3 mg/0.3 mL IJ SOAJ injection INJECT INTRAMUSCULARLY AS DIRECTED 01/29/17   [provider]  FLOVENT HFA 220 MCG/ACT inhaler  06/22/16   [provider]  fluticasone (FLONASE) 50 MCG/ACT nasal spray SHAKE LQ AND U 1 TO 2 SPRAYS IEN QD 02/28/17   [provider]  fluticasone (FLONASE) 50 MCG/ACT nasal spray Place 1 spray into both nostrils daily. 03/28/17 03/28/18  Rebecka Apley, MD  loratadine (CLARITIN) 10 MG tablet Take 1 tablet (10 mg total) by mouth daily. 03/28/17 03/28/18  Rebecka Apley, MD  metFORMIN (GLUCOPHAGE-XR) 500 MG 24 hr tablet Take 1 tablet (500 mg total) by mouth daily with breakfast. 03/06/17   Althea Charon, Netta Neat, DO  montelukast (SINGULAIR) 10 MG tablet TK  1 T PO QD IN THE EVE 02/28/17   [provider]  ondansetron (ZOFRAN ODT) 4 MG disintegrating tablet Take 1 tablet (4 mg total) by mouth every 8 (eight) hours as needed for nausea or vomiting. 12/28/16   Irean HongSung, Jade J, MD  ranitidine (ZANTAC) 150 MG tablet Take 1 tablet (150 mg total) by mouth 2 (two) times daily. 02/11/17 02/11/18  Enid DerryWagner, Ashley, PA-C    Allergies Asa [aspirin]; Reglan [metoclopramide]; and Turmeric  Family History  Problem Relation Age of Onset  . Depression Mother    . Mental retardation Mother   . Bipolar disorder Mother   . Diabetes Father   . Stroke Maternal Grandfather   . Diabetes Maternal Grandfather     Social History Social History  Substance Use Topics  . Smoking status: Never Smoker  . Smokeless tobacco: Never Used  . Alcohol use No    Review of Systems  Constitutional: No fever/chills Eyes: No visual changes. ENT: Scratchy throat, nasal congestion Cardiovascular: Denies chest pain. Respiratory: Cough and shortness of breath. Gastrointestinal: No abdominal pain.  No nausea, no vomiting.  No diarrhea.  No constipation. Genitourinary: Negative for dysuria. Musculoskeletal: Negative for back pain. Skin: Negative for rash. Neurological: Headache   ____________________________________________   PHYSICAL EXAM:  VITAL SIGNS: ED Triage Vitals  Enc Vitals Group     BP 03/27/17 2320 129/74     Pulse Rate 03/27/17 2320 (!) 111     Resp 03/27/17 2320 18     Temp 03/27/17 2320 98.7 F (37.1 C)     Temp Source 03/27/17 2320 Oral     SpO2 03/27/17 2320 96 %     Weight 03/27/17 2321 254 lb (115.2 kg)     Height 03/27/17 2321 5\' 7"  (1.702 m)     Head Circumference --      Peak Flow --      Pain Score 03/27/17 2320 10     Pain Loc --      Pain Edu? --      Excl. in GC? --     Constitutional: Alert and oriented. Well appearing and in Mild distress. Eyes: Conjunctivae are normal. PERRL. EOMI. Head: Atraumatic. Nose: No congestion/rhinnorhea. Mouth/Throat: Mucous membranes are moist.  Oropharynx non-erythematous. Cardiovascular: Normal rate, regular rhythm. Grossly normal heart sounds.  Good peripheral circulation. Respiratory: Normal respiratory effort.  No retractions. Lungs CTAB. Gastrointestinal: Soft and nontender. No distention. Positive bowel sounds Musculoskeletal: No lower extremity tenderness nor edema.  Neurologic:  Normal speech and language.  Skin:  Skin is warm, dry and intact.  Psychiatric: Mood and affect  are normal.   ____________________________________________   LABS (all labs ordered are listed, but only abnormal results are displayed)  Labs Reviewed - No data to display ____________________________________________  EKG  none ____________________________________________  RADIOLOGY  none ____________________________________________   PROCEDURES  Procedure(s) performed: None  Procedures  Critical Care performed: No  ____________________________________________   INITIAL IMPRESSION / ASSESSMENT AND PLAN / ED COURSE  Pertinent labs & imaging results that were available during my care of the patient were reviewed by me and considered in my medical decision making (see chart for details).  This is a 31 year old female who comes in today with some scratchy throat, headache nasal congestion and shortness of breath. The patient shortness of breath is better after she took her inhaler. I feel that the patient has been triggered by the pollen which was outside when she was cleaning the window. I did give the patient  some Claritin and Fioricet for her headache. The patient does have environmental allergies to this is likely the cause of her symptoms. I will give her prescription for Flonase and Claritin for home. The patient should follow-up with her primary care physician.      ____________________________________________   FINAL CLINICAL IMPRESSION(S) / ED DIAGNOSES  Final diagnoses:  Environmental allergies  Shortness of breath      NEW MEDICATIONS STARTED DURING THIS VISIT:  Discharge Medication List as of 03/28/2017  3:46 AM    START taking these medications   Details  !! fluticasone (FLONASE) 50 MCG/ACT nasal spray Place 1 spray into both nostrils daily., Starting Tue 03/28/2017, Until Wed 03/28/2018, Print    loratadine (CLARITIN) 10 MG tablet Take 1 tablet (10 mg total) by mouth daily., Starting Tue 03/28/2017, Until Wed 03/28/2018, Print     !! - Potential  duplicate medications found. Please discuss with provider.       Note:  This document was prepared using Dragon voice recognition software and may include unintentional dictation errors.    Rebecka Apley, MD 03/28/17 [phone]

## 2017-03-28 NOTE — Telephone Encounter (Signed)
Pt asked if it was ok if she took omega 3 along with the one a day women's vitamin.  Her call back number is [phone]

## 2017-03-28 NOTE — Telephone Encounter (Signed)
Appointment today

## 2017-04-05 ENCOUNTER — Other Ambulatory Visit: Payer: Self-pay | Admitting: Family Medicine

## 2017-04-05 LAB — CBC WITH DIFFERENTIAL/PLATELET
Basophils Absolute: 0 cells/uL (ref 0–200)
Basophils Relative: 0 %
Eosinophils Absolute: 88 cells/uL (ref 15–500)
Eosinophils Relative: 2 %
HCT: 35.6 % (ref 35.0–45.0)
Hemoglobin: 11.2 g/dL — ABNORMAL LOW (ref 11.7–15.5)
Lymphocytes Relative: 43 %
Lymphs Abs: 1892 cells/uL (ref 850–3900)
MCH: 25.5 pg — ABNORMAL LOW (ref 27.0–33.0)
MCHC: 31.5 g/dL — ABNORMAL LOW (ref 32.0–36.0)
MCV: 80.9 fL (ref 80.0–100.0)
MPV: 10.1 fL (ref 7.5–12.5)
Monocytes Absolute: 264 cells/uL (ref 200–950)
Monocytes Relative: 6 %
Neutro Abs: 2156 cells/uL (ref 1500–7800)
Neutrophils Relative %: 49 %
Platelets: 218 10*3/uL (ref 140–400)
RBC: 4.4 MIL/uL (ref 3.80–5.10)
RDW: 14.9 % (ref 11.0–15.0)
WBC: 4.4 10*3/uL (ref 3.8–10.8)

## 2017-04-06 LAB — COMPLETE METABOLIC PANEL WITH GFR
ALT: 9 U/L (ref 6–29)
AST: 12 U/L (ref 10–30)
Albumin: 4 g/dL (ref 3.6–5.1)
Alkaline Phosphatase: 44 U/L (ref 33–115)
BUN: 11 mg/dL (ref 7–25)
CO2: 21 mmol/L (ref 20–31)
Calcium: 9.1 mg/dL (ref 8.6–10.2)
Chloride: 107 mmol/L (ref 98–110)
Creat: 0.76 mg/dL (ref 0.50–1.10)
GFR, Est African American: >89 mL/min (ref >=60)
GFR, Est Non African American: >89 mL/min (ref >=60)
Glucose, Bld: 87 mg/dL (ref 65–99)
Potassium: 4.1 mmol/L (ref 3.5–5.3)
Sodium: 138 mmol/L (ref 135–146)
Total Bilirubin: 0.3 mg/dL (ref 0.2–1.2)
Total Protein: 6.6 g/dL (ref 6.1–8.1)

## 2017-04-06 LAB — HEMOGLOBIN A1C
Hgb A1c MFr Bld: 5.9 % — ABNORMAL HIGH (ref <5.7)
Mean Plasma Glucose: 123 mg/dL

## 2017-04-06 LAB — LIPID PANEL
Cholesterol: 175 mg/dL (ref <200)
HDL: 46 mg/dL — ABNORMAL LOW (ref >50)
LDL Cholesterol: 117 mg/dL — ABNORMAL HIGH (ref <100)
Total CHOL/HDL Ratio: 3.8 Ratio (ref <5.0)
Triglycerides: 61 mg/dL (ref <150)
VLDL: 12 mg/dL (ref <30)

## 2017-04-06 LAB — VITAMIN D 25 HYDROXY (VIT D DEFICIENCY, FRACTURES): Vit D, 25-Hydroxy: 19 ng/mL — ABNORMAL LOW (ref 30–100)

## 2017-04-07 ENCOUNTER — Encounter: Payer: Self-pay | Admitting: Hematology and Oncology

## 2017-04-21 ENCOUNTER — Inpatient Hospital Stay (HOSPITAL_BASED_OUTPATIENT_CLINIC_OR_DEPARTMENT_OTHER): Payer: BLUE CROSS/BLUE SHIELD | Admitting: Hematology and Oncology

## 2017-04-21 ENCOUNTER — Encounter: Payer: Self-pay | Admitting: Hematology and Oncology

## 2017-04-21 ENCOUNTER — Inpatient Hospital Stay: Payer: BLUE CROSS/BLUE SHIELD | Attending: Hematology and Oncology | Admitting: *Deleted

## 2017-04-21 VITALS — BP 110/75 | HR 91 | Temp 97.7°F | Resp 18 | Wt 253.5 lb

## 2017-04-21 DIAGNOSIS — F4310 Post-traumatic stress disorder, unspecified: Secondary | ICD-10-CM | POA: Diagnosis not present

## 2017-04-21 DIAGNOSIS — D500 Iron deficiency anemia secondary to blood loss (chronic): Secondary | ICD-10-CM | POA: Diagnosis present

## 2017-04-21 DIAGNOSIS — J45909 Unspecified asthma, uncomplicated: Secondary | ICD-10-CM | POA: Diagnosis not present

## 2017-04-21 DIAGNOSIS — Z79899 Other long term (current) drug therapy: Secondary | ICD-10-CM | POA: Diagnosis not present

## 2017-04-21 DIAGNOSIS — N920 Excessive and frequent menstruation with regular cycle: Secondary | ICD-10-CM

## 2017-04-21 DIAGNOSIS — F419 Anxiety disorder, unspecified: Secondary | ICD-10-CM | POA: Diagnosis not present

## 2017-04-21 LAB — CBC WITH DIFFERENTIAL/PLATELET
Basophils Absolute: 0 10*3/uL (ref 0–0.1)
Basophils Relative: 1 %
Eosinophils Absolute: 0.1 10*3/uL (ref 0–0.7)
Eosinophils Relative: 2 %
HCT: 31.8 % — ABNORMAL LOW (ref 35.0–47.0)
Hemoglobin: 10.5 g/dL — ABNORMAL LOW (ref 12.0–16.0)
Lymphocytes Relative: 44 %
Lymphs Abs: 1.9 10*3/uL (ref 1.0–3.6)
MCH: 25.8 pg — ABNORMAL LOW (ref 26.0–34.0)
MCHC: 33 g/dL (ref 32.0–36.0)
MCV: 78.3 fL — ABNORMAL LOW (ref 80.0–100.0)
Monocytes Absolute: 0.3 10*3/uL (ref 0.2–0.9)
Monocytes Relative: 7 %
Neutro Abs: 2 10*3/uL (ref 1.4–6.5)
Neutrophils Relative %: 46 %
Platelets: 215 10*3/uL (ref 150–440)
RBC: 4.06 MIL/uL (ref 3.80–5.20)
RDW: 14.5 % (ref 11.5–14.5)
WBC: 4.2 10*3/uL (ref 3.6–11.0)

## 2017-04-21 LAB — FERRITIN: Ferritin: 7 ng/mL — ABNORMAL LOW (ref 11–307)

## 2017-04-21 NOTE — Progress Notes (Signed)
Patient states when her iron gets low she feels short of breath.

## 2017-04-21 NOTE — Progress Notes (Signed)
[facility] ([facility]-  Cancer Center  Clinic day:  04/21/2017  Chief Complaint: Jasmine AmassDorthia [name] is a 31 y.o. female with iron deficiency anemia who is seen for 6 month assessment onoral iron.  HPI:  The patient was last seen in the hematology clinic on 10/21/2016.  At that time, she felt fatigued secondary to work.  Exam was unremarkable.  CBC from 10/10/2016 revealed a hematocrit of 35.1, hemoglobin 11.6 and MCV 79.5.  We discussed continuation of iron rich foods and oral iron as tolerated rather than IV iron secondary to only mildly low hematocrit.   She was encouraged to turn in her guaiac cards.  CBC on 04/05/2017 revealed a hematocrit 35.6, hemoglobin 11.2 and MCV 80.9.  Guaiac cards were not turned in.  During the interim, she has been tired.  She states that "too much is going on".  She is working.  She has 2 children with attention deficit disorder.  She is taking a multivitamin with iron.  Her "levels are all low", including vitamin D.  Menses last week and was "not bad".   Past Medical History:  Diagnosis Date  . Allergy   . Anemia   . Anxiety   . Asthma   . Bronchiolitis   . Depression   . Migraine   . Panic attack   . PTSD (post-traumatic stress disorder) 2016    Past Surgical History:  Procedure Laterality Date  . APPENDECTOMY    . Plantar warts      Family History  Problem Relation Age of Onset  . Depression Mother   . Mental retardation Mother   . Bipolar disorder Mother   . Diabetes Father   . Stroke Maternal Grandfather   . Diabetes Maternal Grandfather     Social History:  reports that she has never smoked. She has never used smokeless tobacco. She reports that she does not drink alcohol or use drugs.  She is working Therapist, [employer] to obtain a Immunologistbusiness administration degree.  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  She works 10 hours/day.  She is off 3 days/week.  She has 2 children with ADHD. The patient is alone today.  Allergies:   Allergies  Allergen Reactions  . Asa [Aspirin] Hives and Shortness Of Breath  . Reglan [Metoclopramide] Hives and Shortness Of Breath  . Turmeric     Current Medications: Current Outpatient Prescriptions  Medication Sig Dispense Refill  . albuterol (PROVENTIL HFA;VENTOLIN HFA) 108 (90 Base) MCG/ACT inhaler Inhale 2 puffs into the lungs every 6 (six) hours as needed for wheezing or shortness of breath. 1 Inhaler 11  . albuterol (PROVENTIL) (2.5 MG/3ML) 0.083% nebulizer solution Take 3 mLs (2.5 mg total) by nebulization every 6 (six) hours as needed for wheezing or shortness of breath. 150 mL 2  . BREO ELLIPTA 100-25 MCG/INH AEPB INL 1 PUFF PO QD  3  . EPINEPHrine 0.3 mg/0.3 mL IJ SOAJ injection INJECT INTRAMUSCULARLY AS DIRECTED  0  . FLOVENT HFA 220 MCG/ACT inhaler   11  . fluticasone (FLONASE) 50 MCG/ACT nasal spray Place 1 spray into both nostrils daily. 1 g 0  . Multiple Vitamins-Minerals (MULTIVITAMIN WITH MINERALS) tablet Take 1 tablet by mouth daily.    Marland Kitchen. azelastine (OPTIVAR) 0.05 % ophthalmic solution   3  . loratadine (CLARITIN) 10 MG tablet Take 1 tablet (10 mg total) by mouth daily. (Patient not taking: Reported on 04/21/2017) 30 tablet 0  . metFORMIN (GLUCOPHAGE-XR) 500 MG 24 hr tablet  Take 1 tablet (500 mg total) by mouth daily with breakfast. (Patient not taking: Reported on 04/21/2017) 30 tablet 5  . montelukast (SINGULAIR) 10 MG tablet TK 1 T PO QD IN THE EVE  3  . ondansetron (ZOFRAN ODT) 4 MG disintegrating tablet Take 1 tablet (4 mg total) by mouth every 8 (eight) hours as needed for nausea or vomiting. (Patient not taking: Reported on 04/21/2017) 20 tablet 0  . ranitidine (ZANTAC) 150 MG tablet Take 1 tablet (150 mg total) by mouth 2 (two) times daily. (Patient not taking: Reported on 04/21/2017) 14 tablet 0   No current facility-administered medications for this visit.     Review of Systems:  GENERAL:  Tired.  No fevers or sweats.  Weight up 7 pounds. PERFORMANCE STATUS  (ECOG):  1 HEENT:  No visual changes, runny nose, sore throat, mouth sores or tenderness. Lungs: No shortness of breath or cough.  No hemoptysis. Cardiac:  No chest pain, palpitations, orthopnea, or PND. GI:  No nausea, vomiting, diarrhea, constipation, melena or hematochezia. GU:  History of heavy menses, improved.  No urgency, frequency, dysuria, or hematuria. Musculoskeletal:  No back pain.  No joint pain.  No muscle tenderness. Extremities:  No pain or swelling. Skin:  No rashes or skin changes. Neuro:  No headache, numbness or weakness, balance or coordination issues. Endocrine:  No diabetes, thyroid issues, hot flashes or night sweats.  Feels cold. Psych:  Anxiety, less.  No mood changes.  No depression. Pain:  No focal pain. Review of systems:  All other systems reviewed and found to be negative.  Physical Exam: Blood pressure 110/75, pulse 91, temperature 97.7 F (36.5 C), temperature source Tympanic, resp. rate 18, weight 253 lb 8 oz (115 kg). GENERAL:  Well developed, well nourished, woman sitting comfortably in the exam room in no acute distress. MENTAL STATUS:  Alert and oriented to person, place and time. HEAD:  Long black hair pulled up (braided with purple streaks).  Normocephalic, atraumatic, face symmetric, no Cushingoid features. EYES:  Glasses.  brown eyes.  Pupils equal round and reactive to light and accomodation.  No conjunctivitis or scleral icterus. ENT:  Oropharynx clear without lesion.  Tongue normal. Mucous membranes moist.  RESPIRATORY:  Clear to auscultation without rales, wheezes or rhonchi. CARDIOVASCULAR:  Regular rate and rhythm without murmur, rub or gallop. ABDOMEN:  Soft, non-tender, with active bowel sounds, and no appreciable hepatosplenomegaly.  No masses. SKIN:  No rashes, ulcers or lesions. EXTREMITIES: No edema, no skin discoloration or tenderness.  No palpable cords. LYMPH NODES: No palpable cervical, supraclavicular, axillary or inguinal  adenopathy  NEUROLOGICAL: Unremarkable. PSYCH:  Appropriate.   Appointment on 04/21/2017  Component Date Value Ref Range Status  . WBC 04/21/2017 4.2  3.6 - 11.0 K/uL Final  . RBC 04/21/2017 4.06  3.80 - 5.20 MIL/uL Final  . Hemoglobin 04/21/2017 10.5* 12.0 - 16.0 g/dL Final  . HCT 16/08/9603 31.8* 35.0 - 47.0 % Final  . MCV 04/21/2017 78.3* 80.0 - 100.0 fL Final  . MCH 04/21/2017 25.8* 26.0 - 34.0 pg Final  . MCHC 04/21/2017 33.0  32.0 - 36.0 g/dL Final  . RDW 54/07/8118 14.5  11.5 - 14.5 % Final  . Platelets 04/21/2017 215  150 - 440 K/uL Final  . Neutrophils Relative % 04/21/2017 46  % Final  . Neutro Abs 04/21/2017 2.0  1.4 - 6.5 K/uL Final  . Lymphocytes Relative 04/21/2017 44  % Final  . Lymphs Abs 04/21/2017 1.9  1.0 -  3.6 K/uL Final  . Monocytes Relative 04/21/2017 7  % Final  . Monocytes Absolute 04/21/2017 0.3  0.2 - 0.9 K/uL Final  . Eosinophils Relative 04/21/2017 2  % Final  . Eosinophils Absolute 04/21/2017 0.1  0 - 0.7 K/uL Final  . Basophils Relative 04/21/2017 1  % Final  . Basophils Absolute 04/21/2017 0.0  0 - 0.1 K/uL Final    Assessment:  Jasmine Gordon is a 31 y.o. female with iron deficiency anemia likely due to heavy menses.  Menses improved after IUD placement.  Diet appears good.  She denies any melena, hematochezia, or hematuria.  Urinalysis reveals no blood.  Work-up on 10/03/2016 revealed a hematocrit 34.2, hemoglobin 11.1, MCV 79.5, platelets 196,000, white count 4700 with an ANC of 2800.  Creatinine was 0.81.  Liver function tests were normal.  Ferritin was 12.  Iron studies included a saturation of 12% and TIBC of 340.  Urinalysis revealed no RBCs on 08/25/2016.  She has been on oral iron for several months.  She takes oral iron "sometimes" secondary to tolerance.  She is on a MVI with iron.  Symptomatically, she remains fatigued.  Exam is unremarkable.  Hematocrit has dropped from 35.6 to 31.8 despite oral iron.  Plan: 1.  Labs today: CBC  with diff and ferritin. 2.  Discuss drop in hematocrit despite ongoing oral iron.  Encourage patient to turn in guaiac cards.  Discuss IV iron.  Discuss risks versus benefits.  Patient is agreeable to IV iron. 3.  Venofer weeky x 3.  Begin 04/27/2017. 4.  RTC in 2 months for labs (CBC with diff, ferritin). 5.  RTC in 4 months for MD assessment, labs (CBC with diff, ferritin-day before), and +/- Venofer.   Rosey Bath, MD  04/21/2017, 9:05 AM

## 2017-04-25 ENCOUNTER — Ambulatory Visit: Payer: BLUE CROSS/BLUE SHIELD | Admitting: Dietician

## 2017-04-26 ENCOUNTER — Encounter: Payer: Self-pay | Admitting: Hematology and Oncology

## 2017-04-27 ENCOUNTER — Other Ambulatory Visit: Payer: Self-pay | Admitting: Hematology and Oncology

## 2017-04-27 ENCOUNTER — Inpatient Hospital Stay: Payer: BLUE CROSS/BLUE SHIELD

## 2017-04-27 VITALS — BP 109/75 | HR 116 | Temp 97.5°F | Resp 20

## 2017-04-27 DIAGNOSIS — D500 Iron deficiency anemia secondary to blood loss (chronic): Secondary | ICD-10-CM

## 2017-04-27 MED ORDER — SODIUM CHLORIDE 0.9 % IV SOLN: 200.0000 mg | Freq: Once | INTRAVENOUS | Status: DC

## 2017-04-27 MED ORDER — SODIUM CHLORIDE 0.9 % IV SOLN
Freq: Once | INTRAVENOUS | Status: AC
  Administered 2017-04-27: 14:00:00 via INTRAVENOUS
  Filled 2017-04-27: qty 1000

## 2017-04-27 MED ORDER — IRON SUCROSE 20 MG/ML IV SOLN
200.0000 mg | Freq: Once | INTRAVENOUS | Status: AC
  Administered 2017-04-27: 200 mg via INTRAVENOUS
  Filled 2017-04-27: qty 10

## 2017-05-04 ENCOUNTER — Inpatient Hospital Stay: Payer: BLUE CROSS/BLUE SHIELD

## 2017-05-04 ENCOUNTER — Encounter: Payer: Self-pay | Admitting: Emergency Medicine

## 2017-05-04 ENCOUNTER — Emergency Department
Admission: EM | Admit: 2017-05-04 | Discharge: 2017-05-04 | Disposition: A | Discharge status: Home / Self Care | Payer: BLUE CROSS/BLUE SHIELD | Attending: Emergency Medicine | Admitting: Emergency Medicine

## 2017-05-04 DIAGNOSIS — J45909 Unspecified asthma, uncomplicated: Secondary | ICD-10-CM | POA: Insufficient documentation

## 2017-05-04 DIAGNOSIS — I951 Orthostatic hypotension: Secondary | ICD-10-CM | POA: Insufficient documentation

## 2017-05-04 DIAGNOSIS — D500 Iron deficiency anemia secondary to blood loss (chronic): Secondary | ICD-10-CM | POA: Diagnosis not present

## 2017-05-04 DIAGNOSIS — Z79899 Other long term (current) drug therapy: Secondary | ICD-10-CM | POA: Insufficient documentation

## 2017-05-04 DIAGNOSIS — R1013 Epigastric pain: Secondary | ICD-10-CM | POA: Diagnosis present

## 2017-05-04 LAB — COMPREHENSIVE METABOLIC PANEL
ALT: 11 U/L — ABNORMAL LOW (ref 14–54)
AST: 18 U/L (ref 15–41)
Albumin: 3.9 g/dL (ref 3.5–5.0)
Alkaline Phosphatase: 44 U/L (ref 38–126)
Anion gap: 5 (ref 5–15)
BUN: 10 mg/dL (ref 6–20)
CO2: 25 mmol/L (ref 22–32)
Calcium: 9.1 mg/dL (ref 8.9–10.3)
Chloride: 107 mmol/L (ref 101–111)
Creatinine, Ser: 0.7 mg/dL (ref 0.44–1.00)
GFR calc Af Amer: >60 mL/min (ref >60)
GFR calc non Af Amer: >60 mL/min (ref >60)
Glucose, Bld: 96 mg/dL (ref 65–99)
Potassium: 3.6 mmol/L (ref 3.5–5.1)
Sodium: 137 mmol/L (ref 135–145)
Total Bilirubin: 0.6 mg/dL (ref 0.3–1.2)
Total Protein: 7 g/dL (ref 6.5–8.1)

## 2017-05-04 LAB — URINALYSIS, ROUTINE W REFLEX MICROSCOPIC
Bacteria, UA: NONE SEEN
Specific Gravity, Urine: 1.025 (ref 1.005–1.030)

## 2017-05-04 LAB — CBC
HCT: 34 % — ABNORMAL LOW (ref 35.0–47.0)
Hemoglobin: 11.2 g/dL — ABNORMAL LOW (ref 12.0–16.0)
MCH: 26.1 pg (ref 26.0–34.0)
MCHC: 32.9 g/dL (ref 32.0–36.0)
MCV: 79.2 fL — ABNORMAL LOW (ref 80.0–100.0)
Platelets: 205 10*3/uL (ref 150–440)
RBC: 4.29 MIL/uL (ref 3.80–5.20)
RDW: 14.7 % — ABNORMAL HIGH (ref 11.5–14.5)
WBC: 4 10*3/uL (ref 3.6–11.0)

## 2017-05-04 LAB — POCT PREGNANCY, URINE: Preg Test, Ur: NEGATIVE

## 2017-05-04 LAB — LIPASE, BLOOD: Lipase: 22 U/L (ref 11–51)

## 2017-05-04 MED ORDER — SODIUM CHLORIDE 0.9 % IV BOLUS (SEPSIS)
1000.0000 mL | Freq: Once | INTRAVENOUS | Status: AC
  Administered 2017-05-04: 1000 mL via INTRAVENOUS

## 2017-05-04 MED ORDER — GI COCKTAIL ~~LOC~~
30.0000 mL | Freq: Once | ORAL | Status: AC
  Administered 2017-05-04: 30 mL via ORAL

## 2017-05-04 MED ORDER — ONDANSETRON HCL 4 MG/2ML IJ SOLN
4.0000 mg | Freq: Once | INTRAMUSCULAR | Status: AC
  Administered 2017-05-04: 4 mg via INTRAVENOUS

## 2017-05-04 MED ORDER — ONDANSETRON 4 MG PO TBDP: 4.0000 mg | ORAL_TABLET | Freq: Three times a day (TID) | ORAL | 0 refills | Status: DC | PRN

## 2017-05-04 MED ORDER — IRON SUCROSE 20 MG/ML IV SOLN
200.0000 mg | Freq: Once | INTRAVENOUS | Status: AC
  Administered 2017-05-04: 200 mg via INTRAVENOUS
  Filled 2017-05-04: qty 10

## 2017-05-04 MED ORDER — SODIUM CHLORIDE 0.9 % IV SOLN
Freq: Once | INTRAVENOUS | Status: AC
  Administered 2017-05-04: 15:00:00 via INTRAVENOUS
  Filled 2017-05-04: qty 1000

## 2017-05-04 MED ORDER — BUTALBITAL-APAP-CAFFEINE 50-325-40 MG PO TABS
2.0000 | ORAL_TABLET | Freq: Once | ORAL | Status: AC
  Administered 2017-05-04: 2 via ORAL

## 2017-05-04 NOTE — ED Provider Notes (Signed)
[facility] Emergency Department Provider Note  Time seen: 8:09 AM  I have reviewed the triage vital signs and the nursing notes.   HISTORY  Chief Complaint Nausea and Headache    HPI Jasmine Gordon is a 31 y.o. female with a past medical history of anemia, receives iron transfusions, anxiety, depression, migraines, presents to the emergency department for upper abdominal burning and a headache. According to the patient for the past 4 days she has been experiencing an intermittent burning sensation in her epigastrium. States the sensation comes and goes. Denies any right upper quadrant pain. States it does not hurt it is just a burning discomfort but is not "painful." Denies any lower abdominal discomfort. Denies any nausea, vomiting, diarrhea. Denies dysuria. Patient also states she has been experiencing an intermittent headache. States a history of migraines. Denies any fever, focal weakness or numbness. Patient does state some generalized fatigue/weakness, she is scheduled for an iron transfusion this afternoon.  Past Medical History:  Diagnosis Date  . Allergy   . Anemia   . Anxiety   . Asthma   . Bronchiolitis   . Depression   . Migraine   . Panic attack   . PTSD (post-traumatic stress disorder) 2016    Patient Active Problem List   Diagnosis Date Noted  . Pre-diabetes 03/06/2017  . Hyperlipidemia 03/06/2017  . Allergic reaction 02/08/2017  . Urticaria due to food allergy 02/08/2017  . Vitamin D deficiency 11/08/2016  . Iron deficiency anemia due to chronic blood loss 10/03/2016  . Microcytic anemia 07/15/2016  . Menorrhagia with regular cycle 06/27/2016  . Mild persistent asthma 06/17/2016  . Anxiety and depression 06/17/2016  . BMI 38.0-38.9,adult 06/17/2016    Past Surgical History:  Procedure Laterality Date  . APPENDECTOMY    . Plantar warts      Prior to Admission medications   Medication Sig Start Date End Date Taking?  Authorizing Provider  albuterol (PROVENTIL HFA;VENTOLIN HFA) 108 (90 Base) MCG/ACT inhaler Inhale 2 puffs into the lungs every 6 (six) hours as needed for wheezing or shortness of breath. 06/17/16   Krebs, Laurel Dimmer, NP  albuterol (PROVENTIL) (2.5 MG/3ML) 0.083% nebulizer solution Take 3 mLs (2.5 mg total) by nebulization every 6 (six) hours as needed for wheezing or shortness of breath. 11/11/16   Karamalegos, Netta Neat, DO  azelastine (OPTIVAR) 0.05 % ophthalmic solution  02/28/17   [provider]  BREO ELLIPTA 100-25 MCG/INH AEPB INL 1 PUFF PO QD 02/28/17   [provider]  EPINEPHrine 0.3 mg/0.3 mL IJ SOAJ injection INJECT INTRAMUSCULARLY AS DIRECTED 01/29/17   [provider]  FLOVENT HFA 220 MCG/ACT inhaler  06/22/16   [provider]  fluticasone (FLONASE) 50 MCG/ACT nasal spray Place 1 spray into both nostrils daily. 03/28/17 03/28/18  Rebecka Apley, MD  loratadine (CLARITIN) 10 MG tablet Take 1 tablet (10 mg total) by mouth daily. Patient not taking: Reported on 04/21/2017 03/28/17 03/28/18  Rebecka Apley, MD  metFORMIN (GLUCOPHAGE-XR) 500 MG 24 hr tablet Take 1 tablet (500 mg total) by mouth daily with breakfast. Patient not taking: Reported on 04/21/2017 03/06/17   [name], DO  montelukast (SINGULAIR) 10 MG tablet TK 1 T PO QD IN THE EVE 02/28/17   [provider]  Multiple Vitamins-Minerals (MULTIVITAMIN WITH MINERALS) tablet Take 1 tablet by mouth daily.    [provider]  ondansetron (ZOFRAN ODT) 4 MG disintegrating tablet Take 1 tablet (4 mg total) by  mouth every 8 (eight) hours as needed for nausea or vomiting. Patient not taking: Reported on 04/21/2017 12/28/16   Irean Hong, MD  ranitidine (ZANTAC) 150 MG tablet Take 1 tablet (150 mg total) by mouth 2 (two) times daily. Patient not taking: Reported on 04/21/2017 02/11/17 02/11/18  Enid Derry, PA-C    Allergies  Allergen Reactions  . Asa [Aspirin] Hives and  Shortness Of Breath  . Reglan [Metoclopramide] Hives and Shortness Of Breath  . Turmeric     Family History  Problem Relation Age of Onset  . Depression Mother   . Mental retardation Mother   . Bipolar disorder Mother   . Diabetes Father   . Stroke Maternal Grandfather   . Diabetes Maternal Grandfather     Social History Social History  Substance Use Topics  . Smoking status: Never Smoker  . Smokeless tobacco: Never Used  . Alcohol use No    Review of Systems Constitutional: Negative for fever. Cardiovascular: Negative for chest pain. Respiratory: Negative for shortness of breath. Gastrointestinal: Epigastric burning. Negative for nausea vomiting or diarrhea Genitourinary: Negative for dysuria. Musculoskeletal: Negative for back pain. Neurological: Mild headache. Denies focal weakness or numbness. Positive for Generalized weakness and fatigue. All other ROS negative  ____________________________________________   PHYSICAL EXAM:  VITAL SIGNS: ED Triage Vitals  Enc Vitals Group     BP 05/04/17 0757 127/70     Pulse Rate 05/04/17 0757 97     Resp 05/04/17 0757 18     Temp 05/04/17 0757 97.9 F (36.6 C)     Temp Source 05/04/17 0757 Oral     SpO2 05/04/17 0757 97 %     Weight 05/04/17 0758 252 lb (114.3 kg)     Height 05/04/17 0758 5\' 7"  (1.702 m)     Head Circumference --      Peak Flow --      Pain Score 05/04/17 0757 3     Pain Loc --      Pain Edu? --      Excl. in GC? --     Constitutional: Alert and oriented. Well appearing and in no distress. Eyes: Normal exam ENT   Head: Normocephalic and atraumatic   Mouth/Throat: Mucous membranes are moist. Cardiovascular: Normal rate, regular rhythm. No murmur Respiratory: Normal respiratory effort without tachypnea nor retractions. Breath sounds are clear  Gastrointestinal: Soft and nontender. No distention.   Musculoskeletal: Nontender with normal range of motion in all extremities.  Neurologic:   Normal speech and language. No gross focal neurologic deficits  Skin:  Skin is warm, dry and intact.  Psychiatric: Mood and affect are normal.  ____________________________________________   INITIAL IMPRESSION / ASSESSMENT AND PLAN / ED COURSE  Pertinent labs & imaging results that were available during my care of the patient were reviewed by me and considered in my medical decision making (see chart for details).  Patient presents with epigastric discomfort/burning along with a mild headache. Patient has a completely nontender abdominal exam. We will check labs, treat with a GI cocktail. We'll treat the patient's headache with Fioricet. We will closely monitor in the emergency department while awaiting results. Overall the patient appears very well, no distress and nontoxic in appearance.  Urinalysis shows too numerous to count red and white blood cells. The patient is currently on her period. We will send a urine culture but as she has no urinary symptoms we will hold off on treatment at this time. ____________________________________________   FINAL CLINICAL IMPRESSION(S) /  ED DIAGNOSES  Epigastric pain Headache    Minna AntisPaduchowski, Akita Maxim, MD 05/04/17 1209

## 2017-05-04 NOTE — ED Triage Notes (Signed)
Pt c/o nausea and feeling like a hot feeling in stomach.  Denies vomiting or diarrhea. Has had BM every day. Gets iron infusion today for anemia. Also c/o dull headache on and off.  All sx X 1 week. Ambulatory to room without difficulty. Also has ear pain.

## 2017-05-05 LAB — URINE CULTURE

## 2017-05-10 ENCOUNTER — Inpatient Hospital Stay: Payer: BLUE CROSS/BLUE SHIELD

## 2017-05-10 ENCOUNTER — Encounter: Payer: Self-pay | Admitting: Hematology and Oncology

## 2017-05-10 VITALS — BP 120/81 | HR 97 | Temp 97.9°F | Resp 18

## 2017-05-10 DIAGNOSIS — D500 Iron deficiency anemia secondary to blood loss (chronic): Secondary | ICD-10-CM

## 2017-05-10 MED ORDER — IRON SUCROSE 20 MG/ML IV SOLN
200.0000 mg | Freq: Once | INTRAVENOUS | Status: AC
  Administered 2017-05-10: 200 mg via INTRAVENOUS
  Filled 2017-05-10: qty 10

## 2017-05-10 MED ORDER — SODIUM CHLORIDE 0.9 % IV SOLN: 200.0000 mg | Freq: Once | INTRAVENOUS | Status: DC

## 2017-05-10 MED ORDER — SODIUM CHLORIDE 0.9 % IV SOLN
Freq: Once | INTRAVENOUS | Status: AC
  Administered 2017-05-10: 10:00:00 via INTRAVENOUS
  Filled 2017-05-10: qty 1000

## 2017-05-11 ENCOUNTER — Inpatient Hospital Stay: Payer: BLUE CROSS/BLUE SHIELD

## 2017-05-17 ENCOUNTER — Ambulatory Visit (INDEPENDENT_AMBULATORY_CARE_PROVIDER_SITE_OTHER): Payer: BLUE CROSS/BLUE SHIELD | Admitting: Family Medicine

## 2017-05-17 ENCOUNTER — Encounter: Payer: Self-pay | Admitting: Family Medicine

## 2017-05-17 VITALS — BP 110/81 | HR 88 | Ht 67.0 in | Wt 252.6 lb

## 2017-05-17 DIAGNOSIS — Z6838 Body mass index (BMI) 38.0-38.9, adult: Secondary | ICD-10-CM

## 2017-05-17 DIAGNOSIS — D509 Iron deficiency anemia, unspecified: Secondary | ICD-10-CM

## 2017-05-17 DIAGNOSIS — L230 Allergic contact dermatitis due to metals: Secondary | ICD-10-CM | POA: Diagnosis not present

## 2017-05-17 DIAGNOSIS — E559 Vitamin D deficiency, unspecified: Secondary | ICD-10-CM

## 2017-05-17 DIAGNOSIS — N760 Acute vaginitis: Secondary | ICD-10-CM

## 2017-05-17 DIAGNOSIS — D500 Iron deficiency anemia secondary to blood loss (chronic): Secondary | ICD-10-CM | POA: Diagnosis not present

## 2017-05-17 DIAGNOSIS — R7303 Prediabetes: Secondary | ICD-10-CM

## 2017-05-17 DIAGNOSIS — B9689 Other specified bacterial agents as the cause of diseases classified elsewhere: Secondary | ICD-10-CM

## 2017-05-17 MED ORDER — METRONIDAZOLE 500 MG PO TABS: 500.0000 mg | ORAL_TABLET | Freq: Two times a day (BID) | ORAL | 0 refills | Status: DC

## 2017-05-17 NOTE — Patient Instructions (Addendum)
Thank you for coming to the clinic today.  1. Keep up the good work with lifestyle - work on improving diabetic diet, low carb, low sugar, more water, less other drinks, try to work on improving regular exercise at gym, especially with new job.  2. Left wrist skin spot is allergic reaction or eczema to the metal, likely nickel allergy - Use topical hydrocortisone cream or ointment twice a day for 1-2 weeks until healed  3. For vaginal itch, most likely BV, take Metronidazole 500mg  twice daily for 7 days, do not drink alcohol on this med as it will cause nausea/vomiting. - This problem can be recurrent. Some people are more likely to get it than others, and it has to do with normal body chemistry and vaginal environment. One of the biggest risk factors for causing BV is unprotected sexual intercourse (without a condom), because semen can change the chemistry of your vaginal environment, causing the "good bacteria" reduce and the other bacteria to grow and cause your symptoms. Recommend to start using condoms with intercourse to see if this helps reduce your symptoms, do this especially for next 1 week while on treatment. Some other recommendations include taking a daily probiotic and yogurt can help reduce BV as well, this is not proven to work in all patients.  If your symptoms do not resolve with the treatment, we will need to re-evaluate you to make sure that you didn't develop a yeast infection or other concerns. If you do not tolerate the pills, we can switch your prescription to topical Metrogel, please notify us if you need this change.  4. Vitamin D - recommend Vitamin D3 2,000 daily (minimum daily dose) or can take 5,000 daily  Referral placed to: call them within 2 weeks if you do not hear back about appointment  WEIGHT MANAGEMENT  Dr Quillian Quincearen Beasley  [facility] [address] [place], [place] [address] Ph: [phone]  [phone]  -------------------------------------------------------------------- If you are concerned about any potential cancer screening, I would recommend discussing this further with Dr Merlene Pullingorcoran, but my recommedation would be to consider Colonoscopy for colon cancer screening first due to undetermined bleeding, you could see the GI doctors and they will discuss other potential causes of bleeding. Let me know if you are interested to set this up.  Please schedule a Follow-up Appointment to: Return in about 4 weeks (around 06/16/2017) for Already scheduled for physical.   If you have any other questions or concerns, please feel free to call the clinic or send a message through MyChart. You may also schedule an earlier appointment if necessary.  Additionally, you may be receiving a survey about your experience at our clinic within a few days to 1 week by e-mail or mail. We value your feedback.  Saralyn PilarAlexander Emmalea Treanor, DO [facility]

## 2017-05-17 NOTE — Assessment & Plan Note (Signed)
Stable fluctuating weight, without significant improvement, likely with poor adherence to lifestyle recommendations due to life stressors  Plan: 1. Remain off meds at this time - intolerance to Metformin XR 500mg  daily, has some left over Contrave but ins no longer cover it 2. Encouraged plan to continue to improve lifestyle and resume her gym workouts 3. [facility] Dr Quillian Quincearen Beasley for more comprehensive obesity wt management 4. Follow-up

## 2017-05-17 NOTE — Assessment & Plan Note (Signed)
Due for repeat VItamin D check, prior low 15 to 19 Intolerance to higher dose vitamin D, may try Vitamin D3 5,000 unit daily vs 2k daily

## 2017-05-17 NOTE — Assessment & Plan Note (Addendum)
Remains stable to improved. Now s/p IV iron Venofer x 3 doses in 04/2017 [facility] On paraguard IUD, no significant worsening menorrhagia  Additional concern patient asking about cancer risk. Suspected low risk in this patient, however further discussion given some uncertainty that her degree of menorrhagia is causing symptoms, possibly would entertain idea that screening for GI malignancy vs colon cancer is reasonable. She was given stool guaic cards from Heme/Onc, I do not see results, may consider future colonoscopy or other evaluation if indicated

## 2017-05-17 NOTE — Assessment & Plan Note (Signed)
Left wrist small spot secondary to metal likely nickel exposure with allergy in setting of atopic history - Resolving, advised avoid contact with trigger - May use OTC hydrocortisone

## 2017-05-17 NOTE — Assessment & Plan Note (Signed)
Stable, mildly elevated A1c. Possibly inc A1c could be hemoconcentration, with iron deficiency and blood loss anemia No known complications Failed: Metformin XR 500mg  daily  Plan: 1. Off meds - continue lifestyle improvements diet and resume gym exercise 2. Referral to Dr Dalbert GarnetBeasley weight management 3. Follow-up 4 weeks as scheduled for annual phys and labs a1C

## 2017-05-17 NOTE — Progress Notes (Signed)
Subjective:    Patient ID: Jasmine Gordon, female    DOB: October 12, 1986, 31 y.o.   MRN: [id number]  Jasmine Gordon is a 31 y.o. female presenting on 05/17/2017 for Anemia (iron deficiency, weight management)   HPI   Pre-Diabetes / Weight Management Reports that she stopped the Metformin XR 500mg  daily at last visit 02/2017 did not like the way this medicine made her feel, several side effects, felt she had occasional lightheadedness, insomnia, without GI intolerance, felt like she "function", also A1c had increased from 5.7 to 5.9 on medication. She is concerned that low hemoglobin can be related to higher A1c due to "concentration" - She checks CBG daily with accu-chek AM fasting 80-90s Meds: None (previously on Metformin XR 500mg  - stopped after 2 weeks) Lifestyle: - Diet (increased vegetables, greens, reducing carbs, following Diabetic diet meal planning guide, improved water, less sweet drinks now mostly no calorie drinks, increased whole grain) - Exercise (she just started new job at [employer] and working on Museum/gallery exhibitions officersales floor, will be less active, regular exercise working out daily at gym, plans to walk outside more with warmer weather) - Family history DM Father, and maternal grandfather Denies hypoglycemia, polyuria, visual changes, numbness or tingling.  Iron Deficiency Anemia / Vitamin D Deficiency / Fatigue - Followed [facility] Heme[facility]Onc Dr Merlene Pullingorcoran, s/p IV iron therapy. Was thought to have iron deficiency secondary to menorrhagia, has since had IUD placement. Additional factors for fatigue with multiple life stressors including work and raising children. Recently completed Venofer weekly x 3 IV iron in 04/2017 - Contines to take OTC one a day multivitamin but no longer takes OTC Vitamin D supplement. Prior vitamin D levels were 15, and last 19 (03/2017) - Additionally asking about potential risk for cancer with her low iron levels, also emailed Heme/Onc with similar question  through MyChart  Additional complaints - not focus of visit - Reports vaginal itch without significant discharge, worse during menstrual cycle, has had history of BV before, denies any thicker discharge or concern for yeast infection  - Also Left wrist with small darker skin patch that is itchy, noticed it after wearing fitbit type watch  Social History  Substance Use Topics  . Smoking status: Never Smoker  . Smokeless tobacco: Never Used  . Alcohol use No    Review of Systems Per HPI unless specifically indicated above     Objective:    BP 110/81 (BP Location: Right Arm, Patient Position: Sitting, Cuff Size: Large)   Pulse 88   Ht 5\' 7"  (1.702 m)   Wt 252 lb 9.6 oz (114.6 kg)   LMP 05/03/2017   BMI 39.56 kg/m   Wt Readings from Last 3 Encounters:  05/17/17 252 lb 9.6 oz (114.6 kg)  05/04/17 252 lb (114.3 kg)  04/21/17 253 lb 8 oz (115 kg)    Physical Exam  Constitutional: She is oriented to person, place, and time. She appears well-developed and well-nourished. No distress.  Well-appearing, comfortable, cooperative, obese  HENT:  Head: Normocephalic and atraumatic.  Mouth/Throat: Oropharynx is clear and moist.  Eyes: Conjunctivae are normal. Right eye exhibits no discharge. Left eye exhibits no discharge.  Neck: Normal range of motion. Neck supple. No thyromegaly present.  Cardiovascular: Normal rate, regular rhythm, normal heart sounds and intact distal pulses.   No murmur heard. Pulmonary/Chest: Effort normal and breath sounds normal. No respiratory distress. She has no wheezes. She has no rales.  Good air movement  Abdominal: Soft. Bowel sounds  are normal. She exhibits no distension. There is no tenderness.  Genitourinary:  Genitourinary Comments: Deferred pelvic exam  Musculoskeletal: Normal range of motion. She exhibits no edema.  Lymphadenopathy:    She has no cervical adenopathy.  Neurological: She is alert and oriented to person, place, and time.  Skin:  Skin is warm and dry. Rash (Small area 1-2 cm inner L wrist with hyperpigmentation rough dry scaley patch, consistent with contact allergic dermatitis) noted. She is not diaphoretic. No erythema.  Psychiatric: She has a normal mood and affect. Her behavior is normal.  Well groomed, good eye contact, normal speech and thoughts  Nursing note and vitals reviewed.  Results for orders placed or performed during the hospital encounter of 05/04/17  Urine Culture  Result Value Ref Range   Specimen Description URINE, RANDOM    Special Requests NONE    Culture MULTIPLE SPECIES PRESENT, SUGGEST RECOLLECTION (A)    Report Status 05/05/2017 FINAL   CBC  Result Value Ref Range   WBC 4.0 3.6 - 11.0 K/uL   RBC 4.29 3.80 - 5.20 MIL/uL   Hemoglobin 11.2 (L) 12.0 - 16.0 g/dL   HCT 16.1 (L) 09.6 - 04.5 %   MCV 79.2 (L) 80.0 - 100.0 fL   MCH 26.1 26.0 - 34.0 pg   MCHC 32.9 32.0 - 36.0 g/dL   RDW 40.9 (H) 81.1 - 91.4 %   Platelets 205 150 - 440 K/uL  Comprehensive metabolic panel  Result Value Ref Range   Sodium 137 135 - 145 mmol/L   Potassium 3.6 3.5 - 5.1 mmol/L   Chloride 107 101 - 111 mmol/L   CO2 25 22 - 32 mmol/L   Glucose, Bld 96 65 - 99 mg/dL   BUN 10 6 - 20 mg/dL   Creatinine, Ser 7.82 0.44 - 1.00 mg/dL   Calcium 9.1 8.9 - 95.6 mg/dL   Total Protein 7.0 6.5 - 8.1 g/dL   Albumin 3.9 3.5 - 5.0 g/dL   AST 18 15 - 41 U/L   ALT 11 (L) 14 - 54 U/L   Alkaline Phosphatase 44 38 - 126 U/L   Total Bilirubin 0.6 0.3 - 1.2 mg/dL   GFR calc non Af Amer >60 >60 mL/min   GFR calc Af Amer >60 >60 mL/min   Anion gap 5 5 - 15  Lipase, blood  Result Value Ref Range   Lipase 22 11 - 51 U/L  Urinalysis, Routine w reflex microscopic  Result Value Ref Range   Color, Urine RED (A) YELLOW   APPearance BLOODY (A) CLEAR   Specific Gravity, Urine 1.025 1.005 - 1.030   pH  5.0 - 8.0    TEST NOT REPORTED DUE TO COLOR INTERFERENCE OF URINE PIGMENT   Glucose, UA (A) NEGATIVE mg/dL    TEST NOT REPORTED DUE  TO COLOR INTERFERENCE OF URINE PIGMENT   Hgb urine dipstick (A) NEGATIVE    TEST NOT REPORTED DUE TO COLOR INTERFERENCE OF URINE PIGMENT   Bilirubin Urine (A) NEGATIVE    TEST NOT REPORTED DUE TO COLOR INTERFERENCE OF URINE PIGMENT   Ketones, ur (A) NEGATIVE mg/dL    TEST NOT REPORTED DUE TO COLOR INTERFERENCE OF URINE PIGMENT   Protein, ur (A) NEGATIVE mg/dL    TEST NOT REPORTED DUE TO COLOR INTERFERENCE OF URINE PIGMENT   Nitrite (A) NEGATIVE    TEST NOT REPORTED DUE TO COLOR INTERFERENCE OF URINE PIGMENT   Leukocytes, UA (A) NEGATIVE    TEST NOT REPORTED  DUE TO COLOR INTERFERENCE OF URINE PIGMENT   RBC / HPF TOO NUMEROUS TO COUNT 0 - 5 RBC/hpf   WBC, UA TOO NUMEROUS TO COUNT 0 - 5 WBC/hpf   Bacteria, UA NONE SEEN NONE SEEN   Squamous Epithelial / LPF 6-30 (A) NONE SEEN   Mucous PRESENT   Pregnancy, urine POC  Result Value Ref Range   Preg Test, Ur NEGATIVE NEGATIVE      Assessment & Plan:   Problem List Items Addressed This Visit    Vitamin D deficiency    Due for repeat VItamin D check, prior low 15 to 19 Intolerance to higher dose vitamin D, may try Vitamin D3 5,000 unit daily vs 2k daily      Pre-diabetes - Primary    Stable, mildly elevated A1c. Possibly inc A1c could be hemoconcentration, with iron deficiency and blood loss anemia No known complications Failed: Metformin XR 500mg  daily  Plan: 1. Off meds - continue lifestyle improvements diet and resume gym exercise 2. Referral to Dr Dalbert Garnet weight management 3. Follow-up 4 weeks as scheduled for annual phys and labs a1C      Relevant Orders   Amb Ref to Medical Weight Management   Iron deficiency anemia due to chronic blood loss    Remains stable to improved. Now s/p IV iron Venofer x 3 doses in 04/2017 Followed by [facility] Heme/Onc On paraguard IUD, no significant worsening menorrhagia  Additional concern patient asking about cancer risk. Suspected low risk in this patient, however further discussion given some  uncertainty that her degree of menorrhagia is causing symptoms, possibly would entertain idea that screening for GI malignancy vs colon cancer is reasonable. She was given stool guaic cards from Heme/Onc, I do not see results, may consider future colonoscopy or other evaluation if indicated      BMI 38.0-38.9,adult    Stable fluctuating weight, without significant improvement, likely with poor adherence to lifestyle recommendations due to life stressors  Plan: 1. Remain off meds at this time - intolerance to Metformin XR 500mg  daily, has some left over Contrave but ins no longer cover it 2. Encouraged plan to continue to improve lifestyle and resume her gym workouts 3. Referral to [facility] Dr Quillian Quince for more comprehensive obesity wt management 4. Follow-up      Relevant Orders   Amb Ref to Medical Weight Management   Allergic contact dermatitis due to metals    Left wrist small spot secondary to metal likely nickel exposure with allergy in setting of atopic history - Resolving, advised avoid contact with trigger - May use OTC hydrocortisone       Other Visit Diagnoses    Microcytic anemia       BV (bacterial vaginosis)      Empiric therapy on Metronidazole due to clinical symptoms of similar BV, likely menstrual trigger, if not improving consider diflucan for yeast, follow-up as scheduled in 4 weeks for annual phys + pap, can check wet prep if indicated.    Relevant Medications   metroNIDAZOLE (FLAGYL) 500 MG tablet      Meds ordered this encounter  Medications  . metroNIDAZOLE (FLAGYL) 500 MG tablet    Sig: Take 1 tablet (500 mg total) by mouth 2 (two) times daily. Do not drink alcohol while taking this medicine.    Dispense:  14 tablet    Refill:  0      Follow up plan: Return in about 4 weeks (around 06/16/2017) for Already  scheduled for physical.  [name], DO [facility] 05/17/2017,  11:34 PM

## 2017-05-23 ENCOUNTER — Encounter: Payer: Self-pay | Admitting: Hematology and Oncology

## 2017-05-23 NOTE — Telephone Encounter (Signed)
Per Dr Smith Robertao since the oral iron was not work, it is ok to stop it if she wants. Message sent to patient ok to stop oral iron

## 2017-06-07 ENCOUNTER — Other Ambulatory Visit: Payer: Self-pay | Admitting: Family Medicine

## 2017-06-07 ENCOUNTER — Other Ambulatory Visit: Payer: BLUE CROSS/BLUE SHIELD

## 2017-06-07 DIAGNOSIS — Z0000 Encounter for general adult medical examination without abnormal findings: Secondary | ICD-10-CM

## 2017-06-07 DIAGNOSIS — D500 Iron deficiency anemia secondary to blood loss (chronic): Secondary | ICD-10-CM

## 2017-06-07 DIAGNOSIS — E7800 Pure hypercholesterolemia, unspecified: Secondary | ICD-10-CM

## 2017-06-07 DIAGNOSIS — R7303 Prediabetes: Secondary | ICD-10-CM

## 2017-06-07 LAB — CBC WITH DIFFERENTIAL/PLATELET
Basophils Absolute: 0 cells/uL (ref 0–200)
Basophils Relative: 0 %
Eosinophils Absolute: 114 cells/uL (ref 15–500)
Eosinophils Relative: 2 %
HCT: 35 % (ref 35.0–45.0)
Hemoglobin: 11.1 g/dL — ABNORMAL LOW (ref 11.7–15.5)
Lymphocytes Relative: 18 %
Lymphs Abs: 1026 cells/uL (ref 850–3900)
MCH: 26.4 pg — ABNORMAL LOW (ref 27.0–33.0)
MCHC: 31.7 g/dL — ABNORMAL LOW (ref 32.0–36.0)
MCV: 83.1 fL (ref 80.0–100.0)
MPV: 9.8 fL (ref 7.5–12.5)
Monocytes Absolute: 342 cells/uL (ref 200–950)
Monocytes Relative: 6 %
Neutro Abs: 4218 cells/uL (ref 1500–7800)
Neutrophils Relative %: 74 %
Platelets: 217 10*3/uL (ref 140–400)
RBC: 4.21 MIL/uL (ref 3.80–5.10)
RDW: 15.7 % — ABNORMAL HIGH (ref 11.0–15.0)
WBC: 5.7 10*3/uL (ref 3.8–10.8)

## 2017-06-07 NOTE — Progress Notes (Signed)
Patient presented today for labs, that were repeat despite already being advised via mychart to cancel this lab only apt since had labs done in May 2018 that were drawn early. Reviewed lab results with her today again, these were reviewed during last office visit. She verbally confirms to me today that she would like A1c, Lipid, and CBC re-drawn today, labs ordered, follow-up as scheduled at annual physical in 1-2 weeks.  Jasmine PilarAlexander Amro Winebarger, DO [facility] 06/07/2017, 8:37 AM

## 2017-06-08 LAB — HEMOGLOBIN A1C
Hgb A1c MFr Bld: 5.5 % (ref <5.7)
Mean Plasma Glucose: 111 mg/dL

## 2017-06-08 LAB — LIPID PANEL
Cholesterol: 194 mg/dL (ref <200)
HDL: 50 mg/dL — ABNORMAL LOW (ref >50)
LDL Cholesterol: 133 mg/dL — ABNORMAL HIGH (ref <100)
Total CHOL/HDL Ratio: 3.9 Ratio (ref <5.0)
Triglycerides: 55 mg/dL (ref <150)
VLDL: 11 mg/dL (ref <30)

## 2017-06-10 ENCOUNTER — Encounter: Payer: Self-pay | Admitting: Emergency Medicine

## 2017-06-10 ENCOUNTER — Emergency Department
Admission: EM | Admit: 2017-06-10 | Discharge: 2017-06-10 | Disposition: A | Discharge status: Home / Self Care | Payer: BLUE CROSS/BLUE SHIELD | Attending: Emergency Medicine | Admitting: Emergency Medicine

## 2017-06-10 DIAGNOSIS — M791 Myalgia: Secondary | ICD-10-CM | POA: Insufficient documentation

## 2017-06-10 DIAGNOSIS — J029 Acute pharyngitis, unspecified: Secondary | ICD-10-CM | POA: Diagnosis present

## 2017-06-10 DIAGNOSIS — Z79899 Other long term (current) drug therapy: Secondary | ICD-10-CM | POA: Diagnosis not present

## 2017-06-10 DIAGNOSIS — K122 Cellulitis and abscess of mouth: Secondary | ICD-10-CM | POA: Diagnosis not present

## 2017-06-10 DIAGNOSIS — J4530 Mild persistent asthma, uncomplicated: Secondary | ICD-10-CM | POA: Diagnosis not present

## 2017-06-10 LAB — POCT RAPID STREP A: Streptococcus, Group A Screen (Direct): NEGATIVE

## 2017-06-10 MED ORDER — MAGIC MOUTHWASH W/LIDOCAINE: 5.0000 mL | Freq: Four times a day (QID) | ORAL | 0 refills | Status: DC

## 2017-06-10 MED ORDER — DIPHENHYDRAMINE HCL 12.5 MG/5ML PO ELIX
25.0000 mg | ORAL_SOLUTION | Freq: Once | ORAL | Status: AC
  Administered 2017-06-10: 25 mg via ORAL
  Filled 2017-06-10: qty 10

## 2017-06-10 MED ORDER — LIDOCAINE VISCOUS 2 % MT SOLN
15.0000 mL | Freq: Once | OROMUCOSAL | Status: AC
  Administered 2017-06-10: 15 mL via OROMUCOSAL
  Filled 2017-06-10: qty 15

## 2017-06-10 MED ORDER — METHYLPREDNISOLONE SODIUM SUCC 125 MG IJ SOLR
125.0000 mg | Freq: Once | INTRAMUSCULAR | Status: AC
  Administered 2017-06-10: 125 mg via INTRAMUSCULAR
  Filled 2017-06-10: qty 2

## 2017-06-10 MED ORDER — METHYLPREDNISOLONE 4 MG PO TBPK: ORAL_TABLET | ORAL | 0 refills | Status: DC

## 2017-06-10 NOTE — ED Notes (Signed)
Pt. Going home with friend 

## 2017-06-10 NOTE — ED Notes (Signed)
Pt reports since Tuesday sore throat reports last night she feel her mouth more dry and soreness of throat has increased pt denies any fevers pt talks in complete sentences no distress noted

## 2017-06-10 NOTE — ED Provider Notes (Signed)
[facility] Emergency Department Provider Note   ____________________________________________   First MD Initiated Contact with Patient 06/10/17 1356     (approximate)  I have reviewed the triage vital signs and the nursing notes.   HISTORY  Chief Complaint Sore Throat    HPI Jasmine Gordon is a 31 y.o. female patient complaining of sore throat and swollen tonsils 1 week. Patient said receiving this complaint she had sinus congestion and postnasal drainage. Patient also complaining of body aches. Patient denies nausea vomiting diarrhea. Patient state increased difficulty with swallowing but able to tolerate food and fluids.Patient rates the pain as a 10 over 10. Patient describes pain as "achy". No palliative measures for complaint.   Past Medical History:  Diagnosis Date  . Allergy   . Anemia   . Anxiety   . Asthma   . Bronchiolitis   . Depression   . Migraine   . Panic attack   . PTSD (post-traumatic stress disorder) 2016    Patient Active Problem List   Diagnosis Date Noted  . Allergic contact dermatitis due to metals 05/17/2017  . Pre-diabetes 03/06/2017  . Hyperlipidemia 03/06/2017  . Allergic reaction 02/08/2017  . Urticaria due to food allergy 02/08/2017  . Vitamin D deficiency 11/08/2016  . Iron deficiency anemia due to chronic blood loss 10/03/2016  . Menorrhagia with regular cycle 06/27/2016  . Mild persistent asthma 06/17/2016  . Anxiety and depression 06/17/2016  . BMI 38.0-38.9,adult 06/17/2016    Past Surgical History:  Procedure Laterality Date  . APPENDECTOMY    . Plantar warts      Prior to Admission medications   Medication Sig Start Date End Date Taking? Authorizing Provider  albuterol (PROVENTIL HFA;VENTOLIN HFA) 108 (90 Base) MCG/ACT inhaler Inhale 2 puffs into the lungs every 6 (six) hours as needed for wheezing or shortness of breath. 06/17/16   Krebs, Laurel DimmerAmy Lauren, NP  albuterol (PROVENTIL) (2.5 MG/3ML)  0.083% nebulizer solution Take 3 mLs (2.5 mg total) by nebulization every 6 (six) hours as needed for wheezing or shortness of breath. 11/11/16   Karamalegos, Netta NeatAlexander J, DO  azelastine (OPTIVAR) 0.05 % ophthalmic solution  02/28/17   [provider]  BREO ELLIPTA 100-25 MCG/INH AEPB INL 1 PUFF PO QD 02/28/17   [provider]  EPINEPHrine 0.3 mg/0.3 mL IJ SOAJ injection INJECT INTRAMUSCULARLY AS DIRECTED 01/29/17   [provider]  FLOVENT HFA 220 MCG/ACT inhaler  06/22/16   [provider]  fluticasone (FLONASE) 50 MCG/ACT nasal spray Place 1 spray into both nostrils daily. Patient not taking: Reported on 05/17/2017 03/28/17 03/28/18  Rebecka ApleyWebster, Allison P, MD  loratadine (CLARITIN) 10 MG tablet Take 1 tablet (10 mg total) by mouth daily. Patient not taking: Reported on 05/17/2017 03/28/17 03/28/18  Rebecka ApleyWebster, Allison P, MD  magic mouthwash w/lidocaine SOLN Take 5 mLs by mouth 4 (four) times daily. 06/10/17   Joni ReiningSmith, Yvone Slape K, PA-C  methylPREDNISolone (MEDROL DOSEPAK) 4 MG TBPK tablet Take Tapered dose as directed on Sunday morning. 06/10/17   Joni ReiningSmith, Altagracia Rone K, PA-C  metroNIDAZOLE (FLAGYL) 500 MG tablet Take 1 tablet (500 mg total) by mouth 2 (two) times daily. Do not drink alcohol while taking this medicine. 05/17/17   Karamalegos, Netta NeatAlexander J, DO  montelukast (SINGULAIR) 10 MG tablet TK 1 T PO QD IN THE EVE 02/28/17   [provider]  Multiple Vitamins-Minerals (MULTIVITAMIN WITH MINERALS) tablet Take 1 tablet by mouth daily.    [provider]  ranitidine (ZANTAC)  150 MG tablet Take 1 tablet (150 mg total) by mouth 2 (two) times daily. Patient not taking: Reported on 04/21/2017 02/11/17 02/11/18  Enid Derry, PA-C    Allergies Asa [aspirin]; Reglan [metoclopramide]; and Turmeric  Family History  Problem Relation Age of Onset  . Depression Mother   . Mental retardation Mother   . Bipolar disorder Mother   . Diabetes Father   . Stroke Maternal Grandfather     . Diabetes Maternal Grandfather     Social History Social History  Substance Use Topics  . Smoking status: Never Smoker  . Smokeless tobacco: Never Used  . Alcohol use No    Review of Systems Constitutional: No fever/chills. Body aches Eyes: No visual changes. ENT: Sore throat  Cardiovascular: Denies chest pain. Respiratory: Denies shortness of breath. Gastrointestinal: No abdominal pain.  No nausea, no vomiting.  No diarrhea.  No constipation. Genitourinary: Negative for dysuria. Musculoskeletal: Negative for back pain. Skin: Negative for rash. Neurological: Negative for headaches, focal weakness or numbness. Psychiatric:Anxiety, depression, and PTSD. Allergic/Immunilogical: See medication list ____________________________________________   PHYSICAL EXAM:  VITAL SIGNS: ED Triage Vitals  Enc Vitals Group     BP 06/10/17 1335 111/74     Pulse Rate 06/10/17 1335 92     Resp 06/10/17 1335 20     Temp 06/10/17 1335 98.3 F (36.8 C)     Temp Source 06/10/17 1335 Oral     SpO2 06/10/17 1335 100 %     Weight 06/10/17 1335 252 lb (114.3 kg)     Height 06/10/17 1335 5\' 7"  (1.702 m)     Head Circumference --      Peak Flow --      Pain Score 06/10/17 1334 10     Pain Loc --      Pain Edu? --      Excl. in GC? --     Constitutional: Alert and oriented. Well appearing and in no acute distress. Nose: No congestion/rhinnorhea. Mouth/Throat: Mucous membranes are moist.  Oropharynx Edematous tonsils and uvula. No visible exudate. Neck: No stridor.  No cervical spine tenderness to palpation. Hematological/Lymphatic/Immunilogical: No cervical lymphadenopathy. Cardiovascular: Normal rate, regular rhythm. Grossly normal heart sounds.  Good peripheral circulation. Respiratory: Normal respiratory effort.  No retractions. Lungs CTAB. Neurologic:  Normal speech and language. No gross focal neurologic deficits are appreciated. No gait instability. Skin:  Skin is warm, dry and  intact. No rash noted. Psychiatric: Mood and affect are normal. Speech and behavior are normal.  ____________________________________________   LABS (all labs ordered are listed, but only abnormal results are displayed)  Labs Reviewed  POCT RAPID STREP A   ____________________________________________  EKG   ____________________________________________  RADIOLOGY  No results found.  ____________________________________________   PROCEDURES  Procedure(s) performed: None  Procedures  Critical Care performed: No  ____________________________________________   INITIAL IMPRESSION / ASSESSMENT AND PLAN / ED COURSE  Pertinent labs & imaging results that were available during my care of the patient were reviewed by me and considered in my medical decision making (see chart for details).  Colitis. Patient rapid strep test is negative. Patient given discharge Instructions return to work no. Patient last take medications as directed. Asked to follow PCP if condition does not improve in 3-4 days.      ____________________________________________   FINAL CLINICAL IMPRESSION(S) / ED DIAGNOSES  Final diagnoses:  Uvulitis      NEW MEDICATIONS STARTED DURING THIS VISIT:  New Prescriptions   MAGIC MOUTHWASH W/LIDOCAINE SOLN  Take 5 mLs by mouth 4 (four) times daily.   METHYLPREDNISOLONE (MEDROL DOSEPAK) 4 MG TBPK TABLET    Take Tapered dose as directed on Sunday morning.     Note:  This document was prepared using Dragon voice recognition software and may include unintentional dictation errors.    Joni Reining, PA-C 06/10/17 1452    Governor Rooks, MD 06/10/17 [phone]

## 2017-06-10 NOTE — ED Triage Notes (Signed)
Sore throat and tonsils swollen x 1 week.

## 2017-06-16 ENCOUNTER — Encounter: Payer: BLUE CROSS/BLUE SHIELD | Admitting: Family Medicine

## 2017-06-22 ENCOUNTER — Inpatient Hospital Stay: Payer: BLUE CROSS/BLUE SHIELD | Attending: Hematology and Oncology

## 2017-06-22 DIAGNOSIS — N920 Excessive and frequent menstruation with regular cycle: Secondary | ICD-10-CM | POA: Insufficient documentation

## 2017-06-22 DIAGNOSIS — D500 Iron deficiency anemia secondary to blood loss (chronic): Secondary | ICD-10-CM

## 2017-06-22 DIAGNOSIS — Z79899 Other long term (current) drug therapy: Secondary | ICD-10-CM | POA: Insufficient documentation

## 2017-06-22 DIAGNOSIS — J45909 Unspecified asthma, uncomplicated: Secondary | ICD-10-CM | POA: Insufficient documentation

## 2017-06-22 DIAGNOSIS — F419 Anxiety disorder, unspecified: Secondary | ICD-10-CM | POA: Insufficient documentation

## 2017-06-22 DIAGNOSIS — F4310 Post-traumatic stress disorder, unspecified: Secondary | ICD-10-CM | POA: Insufficient documentation

## 2017-06-22 LAB — CBC WITH DIFFERENTIAL/PLATELET
Basophils Absolute: 0 10*3/uL (ref 0–0.1)
Basophils Relative: 0 %
Eosinophils Absolute: 0.1 10*3/uL (ref 0–0.7)
Eosinophils Relative: 1 %
HCT: 34.4 % — ABNORMAL LOW (ref 35.0–47.0)
Hemoglobin: 11.4 g/dL — ABNORMAL LOW (ref 12.0–16.0)
Lymphocytes Relative: 46 %
Lymphs Abs: 2.7 10*3/uL (ref 1.0–3.6)
MCH: 26.6 pg (ref 26.0–34.0)
MCHC: 33.3 g/dL (ref 32.0–36.0)
MCV: 79.9 fL — ABNORMAL LOW (ref 80.0–100.0)
Monocytes Absolute: 0.3 10*3/uL (ref 0.2–0.9)
Monocytes Relative: 5 %
Neutro Abs: 2.8 10*3/uL (ref 1.4–6.5)
Neutrophils Relative %: 48 %
Platelets: 209 10*3/uL (ref 150–440)
RBC: 4.3 MIL/uL (ref 3.80–5.20)
RDW: 15.4 % — ABNORMAL HIGH (ref 11.5–14.5)
WBC: 5.9 10*3/uL (ref 3.6–11.0)

## 2017-06-22 LAB — FERRITIN: Ferritin: 63 ng/mL (ref 11–307)

## 2017-07-14 ENCOUNTER — Other Ambulatory Visit: Payer: Self-pay | Admitting: Family Medicine

## 2017-07-14 ENCOUNTER — Ambulatory Visit (INDEPENDENT_AMBULATORY_CARE_PROVIDER_SITE_OTHER): Payer: BLUE CROSS/BLUE SHIELD | Admitting: Family Medicine

## 2017-07-14 ENCOUNTER — Encounter: Payer: Self-pay | Admitting: Family Medicine

## 2017-07-14 VITALS — BP 117/67 | HR 98 | Temp 99.3°F | Resp 16 | Ht 67.0 in | Wt 256.0 lb

## 2017-07-14 DIAGNOSIS — D500 Iron deficiency anemia secondary to blood loss (chronic): Secondary | ICD-10-CM

## 2017-07-14 DIAGNOSIS — R7303 Prediabetes: Secondary | ICD-10-CM | POA: Diagnosis not present

## 2017-07-14 DIAGNOSIS — Z0000 Encounter for general adult medical examination without abnormal findings: Secondary | ICD-10-CM

## 2017-07-14 DIAGNOSIS — E7800 Pure hypercholesterolemia, unspecified: Secondary | ICD-10-CM

## 2017-07-14 DIAGNOSIS — Z6841 Body Mass Index (BMI) 40.0 and over, adult: Secondary | ICD-10-CM

## 2017-07-14 DIAGNOSIS — Z124 Encounter for screening for malignant neoplasm of cervix: Secondary | ICD-10-CM

## 2017-07-14 NOTE — Assessment & Plan Note (Addendum)
Significantly improved A1c 5.9 to 5.5 due to lifestyle, since off medications since 02/2017 No known complications Failed: Metformin XR 500mg  daily  Plan: 1. Remain off meds - continue lifestyle improvements diet and ym exercise 2. F/u Dr Dalbert Garnet weight management 3. Follow-up 3 months Pre-DM A1c, wt check

## 2017-07-14 NOTE — Assessment & Plan Note (Addendum)
Stable, improved Followed by [facility] Heme/Onc s/p IV iron Venofer x 3 doses in 04/2017 On paraguard IUD, improved menorrhagia

## 2017-07-14 NOTE — Assessment & Plan Note (Addendum)
Mostly controlled cholesterol on improved lifestyle Last lipid panel 05/2017  Plan: 1. Encourage improved lifestyle - continue dietary changes with low carb/cholesterol, reduce portion size, consider keto diet, continue improving regular exercise

## 2017-07-14 NOTE — Assessment & Plan Note (Signed)
Stable fluctuating weight, without significant improvement in wt, but notable improved A1c even off of medications, suspect lifestyle is working - Not yet established with [facility] (Dr Dalbert Garnet) - on waiting list  Plan: 1. Congratulated A1c improvement, and reassurance on future wt loss 2. Encouraged plan to continue current regular exercise gym regimen, and discussion on dietary modifications, including possible keto diet among other low carb diets 3. Follow-up with waiting list to still go to [facility] Weight Management Dr Quillian Quince for more comprehensive obesity wt management 4. Follow-up 3 mo wt check

## 2017-07-14 NOTE — Progress Notes (Signed)
Subjective:    Patient ID: Jasmine Gordon, female    DOB: [birth year], 31 y.o.   MRN: [id number]  Jasmine Gordon is a 31 y.o. female presenting on 07/14/2017 for Annual Exam   HPI   Here for Annual Physical. Lab results were previously reviewed.  Pre-Diabetes / MORBID OBESITY BMI >40 - Last visit with me 05/17/17, for same problem, at that time she had remained off Metformin XR 500mg  due to intolerance multiple side effects, she was otherwise encouraged to improve lifestyle, and referred to Dr Quillian Quince [facility]), see prior notes for background information. - Today patient reports that she is pleased with her continued lifestyle improvements but still admits that her weight has fluctuated. - She is pleased with A1c improvement from 5.9 to 5.5 Lifestyle: - Diet (Continues DM diet, increased vegetables, reduced carbs, inc water (some no calorie drinks) - Exercise (Try to go to gym everyday before work, weight strength training and cardio, squats and dumbbells, push-ups) - Meds for Pre-DM: none, remains off Metformin XR - Never saw Dr Dalbert Garnet, she was called but unable to schedule yet since on the waiting list - Family history DM Father, and maternal grandfather Denies hypoglycemia, polyuria, visual changes, numbness or tingling.  Iron Deficiency Anemia / Vitamin D Deficiency / Fatigue - Followed by [facility]/Onc Dr Merlene Pulling, s/p IV iron therapy - Reports improved since IV iron. Less fatigue, able to exercise well at gym - Was advised by Heme to DC oral iron  HYPERLIPIDEMIA: - Reports no concerns. Last lipid panel 05/2017, mild inc LDL, otherwise improved - See lifestyle above  Health Maintenance: - Cervical Cancer Screening: Last Pap smear 05/22/15, negative. Requests repeat today, at age 41, not due for HPV co-testing, and no prior abnormal pap. - UTD TDap, routine HIV screen, will return for Flu Shot  Social History  Substance Use Topics  . Smoking  status: Never Smoker  . Smokeless tobacco: Never Used  . Alcohol use No    Review of Systems  Constitutional: Negative for activity change, appetite change, chills, diaphoresis, fatigue, fever and unexpected weight change.  HENT: Negative for congestion, hearing loss and sinus pressure.   Eyes: Negative for visual disturbance.  Respiratory: Negative for cough, chest tightness, shortness of breath and wheezing.   Cardiovascular: Negative for chest pain, palpitations and leg swelling.  Gastrointestinal: Negative for abdominal distention, abdominal pain, anal bleeding, blood in stool, constipation, diarrhea, nausea and vomiting.  Endocrine: Negative for cold intolerance and polyuria.  Genitourinary: Negative for decreased urine volume, difficulty urinating, dysuria, frequency, hematuria, menstrual problem, vaginal discharge and vaginal pain.  Musculoskeletal: Negative for arthralgias, back pain and neck pain.  Skin: Negative for rash.  Allergic/Immunologic: Negative for environmental allergies.  Neurological: Negative for dizziness, weakness, light-headedness, numbness and headaches.  Hematological: Negative for adenopathy.  Psychiatric/Behavioral: Negative for behavioral problems, dysphoric mood and sleep disturbance.   Per HPI unless specifically indicated above     Objective:    BP 117/67   Pulse 98   Temp 99.3 F (37.4 C) (Oral)   Resp 16   Ht 5\' 7"  (1.702 m)   Wt 256 lb (116.1 kg)   BMI 40.10 kg/m   Wt Readings from Last 3 Encounters:  07/14/17 256 lb (116.1 kg)  06/10/17 252 lb (114.3 kg)  05/17/17 252 lb 9.6 oz (114.6 kg)    Physical Exam  Constitutional: She is oriented to person, place, and time. She appears well-developed and well-nourished. No distress.  Well-appearing, comfortable, cooperative, obese  HENT:  Head: Normocephalic and atraumatic.  Mouth/Throat: Oropharynx is clear and moist.  Frontal / maxillary sinuses non-tender. Nares patent without purulence or  edema. Bilateral TMs clear without erythema, effusion or bulging. Oropharynx clear without erythema, exudates, edema or asymmetry.  Eyes: Conjunctivae are normal. Right eye exhibits no discharge. Left eye exhibits no discharge.  Neck: Normal range of motion. Neck supple. No thyromegaly present.  Cardiovascular: Normal rate, regular rhythm, normal heart sounds and intact distal pulses.   No murmur heard. Pulmonary/Chest: Effort normal and breath sounds normal. No respiratory distress. She has no wheezes. She has no rales.  Good air movement  Abdominal: Soft. Bowel sounds are normal. She exhibits no distension. There is no tenderness.  Genitourinary:  Genitourinary Comments: Normal external female genitalia. Vaginal canal without lesions. Normal appearing cervix, without lesions or bleeding. Appropriate IUD strings present through cervical os. Slightly increased physiologic discharge on exam. Bimanual exam without masses or cervical motion tenderness.  Pelvic Exam chaperoned by Elvina Mattes, CMA  Musculoskeletal: Normal range of motion. She exhibits no edema.  Lymphadenopathy:    She has no cervical adenopathy.  Neurological: She is alert and oriented to person, place, and time.  Skin: Skin is warm and dry. No rash noted. She is not diaphoretic. No erythema.  Psychiatric: She has a normal mood and affect. Her behavior is normal.  Well groomed, good eye contact, normal speech and thoughts  Nursing note and vitals reviewed.   Recent Labs  02/24/17 0001 04/05/17 0945 06/07/17 0921  HGBA1C 5.7* 5.9* 5.5     Results for orders placed or performed in visit on 06/22/17  CBC with Differential/Platelet  Result Value Ref Range   WBC 5.9 3.6 - 11.0 K/uL   RBC 4.30 3.80 - 5.20 MIL/uL   Hemoglobin 11.4 (L) 12.0 - 16.0 g/dL   HCT 16.1 (L) 09.6 - 04.5 %   MCV 79.9 (L) 80.0 - 100.0 fL   MCH 26.6 26.0 - 34.0 pg   MCHC 33.3 32.0 - 36.0 g/dL   RDW 40.9 (H) 81.1 - 91.4 %   Platelets 209 150 -  440 K/uL   Neutrophils Relative % 48 %   Neutro Abs 2.8 1.4 - 6.5 K/uL   Lymphocytes Relative 46 %   Lymphs Abs 2.7 1.0 - 3.6 K/uL   Monocytes Relative 5 %   Monocytes Absolute 0.3 0.2 - 0.9 K/uL   Eosinophils Relative 1 %   Eosinophils Absolute 0.1 0 - 0.7 K/uL   Basophils Relative 0 %   Basophils Absolute 0.0 0 - 0.1 K/uL  Ferritin  Result Value Ref Range   Ferritin 63 11 - 307 ng/mL      Assessment & Plan:   Problem List Items Addressed This Visit    Pre-diabetes    Significantly improved A1c 5.9 to 5.5 due to lifestyle, since off medications since 02/2017 No known complications Failed: Metformin XR 500mg  daily  Plan: 1. Remain off meds - continue lifestyle improvements diet and ym exercise 2. F/u Dr Dalbert Garnet weight management 3. Follow-up 3 months Pre-DM A1c, wt check      Morbid obesity with BMI of 40.0-44.9, adult (HCC)    Stable fluctuating weight, without significant improvement in wt, but notable improved A1c even off of medications, suspect lifestyle is working - Not yet established with [facility] (Dr Dalbert Garnet) - on waiting list  Plan: 1. Congratulated A1c improvement, and reassurance on future wt loss 2. Encouraged plan  to continue current regular exercise gym regimen, and discussion on dietary modifications, including possible keto diet among other low carb diets 3. Follow-up with waiting list to still go to [facility] Dr Quillian Quince for more comprehensive obesity wt management 4. Follow-up 3 mo wt check      Iron deficiency anemia due to chronic blood loss    Stable, improved Followed by [facility]e/Onc s/p IV iron Venofer x 3 doses in 04/2017 On paraguard IUD, improved menorrhagia      Hyperlipidemia    Mostly controlled cholesterol on improved lifestyle Last lipid panel 05/2017  Plan: 1. Encourage improved lifestyle - continue dietary changes with low carb/cholesterol, reduce portion size, consider keto diet, continue  improving regular exercise       Other Visit Diagnoses    Annual physical exam    -  Primary   Screening for cervical cancer       Pap smear collected today, age 44, next due 1-3 years, will be >30 yr, add HPV co testing   Relevant Orders   Pap, ThinPrep rflx HPV     No orders of the defined types were placed in this encounter.   Follow up plan: Return in about 3 months (around 10/14/2017) for Pre-DM A1c, Weight Check.  [name], DO [facility] 07/14/2017, 11:10 PM

## 2017-07-14 NOTE — Patient Instructions (Addendum)
Thank you for coming to the clinic today.  1. Keep up the great work! 2. May try Keto diet, have had success with several patients 3. You are still on wait list for Dr Dalbert Garnet, keep them in mind if you are interested. 4. Remain off Metformin and Iron supplement  Pap smear today - will notify you of results within next 1 week. If it is normal it is good for up to 1-3 years, next pap smear test would be age >30 and would include HPV co testing.  Flu shot in the Fall  Please schedule a Follow-up Appointment to: Return in about 3 months (around 10/14/2017) for Pre-DM A1c, Weight Check.  If you have any other questions or concerns, please feel free to call the clinic or send a message through MyChart. You may also schedule an earlier appointment if necessary.  Additionally, you may be receiving a survey about your experience at our clinic within a few days to 1 week by e-mail or mail. We value your feedback.  [name], DO [facility], [place]

## 2017-07-17 ENCOUNTER — Encounter: Payer: Self-pay | Admitting: Family Medicine

## 2017-07-17 LAB — PAP, THINPREP RFLX HPV

## 2017-07-19 ENCOUNTER — Encounter: Payer: Self-pay | Admitting: Family Medicine

## 2017-08-01 ENCOUNTER — Emergency Department
Admission: EM | Admit: 2017-08-01 | Discharge: 2017-08-01 | Disposition: A | Discharge status: Home / Self Care | Payer: 59 | Attending: Emergency Medicine | Admitting: Emergency Medicine

## 2017-08-01 ENCOUNTER — Encounter: Payer: Self-pay | Admitting: *Deleted

## 2017-08-01 ENCOUNTER — Emergency Department: Payer: 59

## 2017-08-01 DIAGNOSIS — J45909 Unspecified asthma, uncomplicated: Secondary | ICD-10-CM | POA: Insufficient documentation

## 2017-08-01 DIAGNOSIS — F419 Anxiety disorder, unspecified: Secondary | ICD-10-CM | POA: Insufficient documentation

## 2017-08-01 DIAGNOSIS — R42 Dizziness and giddiness: Secondary | ICD-10-CM

## 2017-08-01 DIAGNOSIS — R7303 Prediabetes: Secondary | ICD-10-CM | POA: Insufficient documentation

## 2017-08-01 DIAGNOSIS — R002 Palpitations: Secondary | ICD-10-CM | POA: Diagnosis not present

## 2017-08-01 LAB — URINALYSIS, COMPLETE (UACMP) WITH MICROSCOPIC
Bacteria, UA: NONE SEEN
Bilirubin Urine: NEGATIVE
Glucose, UA: NEGATIVE mg/dL
Ketones, ur: NEGATIVE mg/dL
Leukocytes, UA: NEGATIVE
Nitrite: NEGATIVE
Protein, ur: NEGATIVE mg/dL
Specific Gravity, Urine: 1.027 (ref 1.005–1.030)
pH: 5 (ref 5.0–8.0)

## 2017-08-01 LAB — BASIC METABOLIC PANEL
Anion gap: 7 (ref 5–15)
BUN: 15 mg/dL (ref 6–20)
CO2: 23 mmol/L (ref 22–32)
Calcium: 9.1 mg/dL (ref 8.9–10.3)
Chloride: 109 mmol/L (ref 101–111)
Creatinine, Ser: 0.68 mg/dL (ref 0.44–1.00)
GFR calc Af Amer: >60 mL/min (ref >60)
GFR calc non Af Amer: >60 mL/min (ref >60)
Glucose, Bld: 100 mg/dL — ABNORMAL HIGH (ref 65–99)
Potassium: 3.9 mmol/L (ref 3.5–5.1)
Sodium: 139 mmol/L (ref 135–145)

## 2017-08-01 LAB — CBC
HCT: 35 % (ref 35.0–47.0)
Hemoglobin: 11.8 g/dL — ABNORMAL LOW (ref 12.0–16.0)
MCH: 27.1 pg (ref 26.0–34.0)
MCHC: 33.7 g/dL (ref 32.0–36.0)
MCV: 80.5 fL (ref 80.0–100.0)
Platelets: 204 10*3/uL (ref 150–440)
RBC: 4.35 MIL/uL (ref 3.80–5.20)
RDW: 14.6 % — ABNORMAL HIGH (ref 11.5–14.5)
WBC: 4.3 10*3/uL (ref 3.6–11.0)

## 2017-08-01 LAB — TSH: TSH: 3.476 u[IU]/mL (ref 0.350–4.500)

## 2017-08-01 LAB — PREGNANCY, URINE: Preg Test, Ur: NEGATIVE

## 2017-08-01 LAB — FIBRIN DERIVATIVES D-DIMER (ARMC ONLY): Fibrin derivatives D-dimer (ARMC): 604.31 — ABNORMAL HIGH (ref 0.00–499.00)

## 2017-08-01 MED ORDER — DIAZEPAM 2 MG PO TABS: 2.0000 mg | ORAL_TABLET | Freq: Three times a day (TID) | ORAL | 0 refills | Status: DC | PRN

## 2017-08-01 MED ORDER — IOPAMIDOL (ISOVUE-370) INJECTION 76%
75.0000 mL | Freq: Once | INTRAVENOUS | Status: AC | PRN
  Administered 2017-08-01: 75 mL via INTRAVENOUS

## 2017-08-01 MED ORDER — LORAZEPAM 0.5 MG PO TABS: 0.5000 mg | ORAL_TABLET | Freq: Three times a day (TID) | ORAL | 0 refills | Status: DC | PRN

## 2017-08-01 MED ORDER — DIAZEPAM 5 MG PO TABS
5.0000 mg | ORAL_TABLET | Freq: Once | ORAL | Status: AC
  Administered 2017-08-01: 5 mg via ORAL
  Filled 2017-08-01: qty 1

## 2017-08-01 NOTE — ED Notes (Signed)
Patient transported to CT 

## 2017-08-01 NOTE — ED Triage Notes (Signed)
Pt states she was at the beach last week and began to fel dizzy and weak, states this AM the feeling increased, denies any pain but states feeling as if she can not catch her breathe and her heart is racing, awake and alert in no acute distress

## 2017-08-01 NOTE — ED Notes (Signed)
Patient trying to call ride.  States will keep trying and wait in lobby.

## 2017-08-01 NOTE — ED Provider Notes (Signed)
[facility] Emergency Department Provider Note       Time seen: ----------------------------------------- 8:22 AM on 08/01/2017 -----------------------------------------     I have reviewed the triage vital signs and the nursing notes.   HISTORY   Chief Complaint Dizziness    HPI Jasmine Gordon is a 31 y.o. female who presents to the ED for dizziness and weakness. Patient states she was at the beach last week when the symptoms started. Patient states this morning she started feeling worse. She denies any pain but states she's been feeling that she cannot catch her breath and that her heart is racing. She states she exercised yesterday, is not sure if she is under extra stress, has not had any recent illness.   Past Medical History:  Diagnosis Date  . Allergy   . Anemia   . Anxiety   . Asthma   . Bronchiolitis   . Depression   . Migraine   . Panic attack   . PTSD (post-traumatic stress disorder) 2016    Patient Active Problem List   Diagnosis Date Noted  . Allergic contact dermatitis due to metals 05/17/2017  . Pre-diabetes 03/06/2017  . Hyperlipidemia 03/06/2017  . Allergic reaction 02/08/2017  . Urticaria due to food allergy 02/08/2017  . Vitamin D deficiency 11/08/2016  . Iron deficiency anemia due to chronic blood loss 10/03/2016  . Menorrhagia with regular cycle 06/27/2016  . Mild persistent asthma 06/17/2016  . Anxiety and depression 06/17/2016  . Morbid obesity with BMI of 40.0-44.9, adult (HCC) 06/17/2016    Past Surgical History:  Procedure Laterality Date  . APPENDECTOMY    . Plantar warts      Allergies Asa [aspirin]; Reglan [metoclopramide]; and Turmeric  Social History Social History  Substance Use Topics  . Smoking status: Never Smoker  . Smokeless tobacco: Never Used  . Alcohol use No    Review of Systems Constitutional: Negative for fever. Eyes: Negative for vision changes ENT:  Negative for  congestion, sore throat Cardiovascular: Negative for chest pain.positive for palpitations Respiratory: positive for shortness of breath Gastrointestinal: Negative for abdominal pain, vomiting and diarrhea. Genitourinary: Negative for dysuria. Musculoskeletal: Negative for back pain. Skin: Negative for rash. Neurological: Negative for headaches, positive for weakness  All systems negative/normal/unremarkable except as stated in the HPI  ____________________________________________   PHYSICAL EXAM:  VITAL SIGNS: ED Triage Vitals  Enc Vitals Group     BP 08/01/17 0812 114/71     Pulse Rate 08/01/17 0812 (!) 119     Resp 08/01/17 0812 16     Temp 08/01/17 0812 98.4 F (36.9 C)     Temp Source 08/01/17 0812 Oral     SpO2 08/01/17 0812 100 %     Weight 08/01/17 0810 256 lb (116.1 kg)     Height 08/01/17 0810  (1.702 m)     Head Circumference --      Peak Flow --      Pain Score --      Pain Loc --      Pain Edu? --      Excl. in GC? --     Constitutional: Alert and oriented. Well appearing and in no distress. Eyes: Conjunctivae are normal. Normal extraocular movements. ENT   Head: Normocephalic and atraumatic.   Nose: No congestion/rhinnorhea.   Mouth/Throat: Mucous membranes are moist.   Neck: No stridor. Cardiovascular: Normal rate, regular rhythm. No murmurs, rubs, or gallops. Respiratory: Normal respiratory effort without tachypnea nor retractions.  Breath sounds are clear and equal bilaterally. No wheezes/rales/rhonchi. Gastrointestinal: Soft and nontender. Normal bowel sounds Musculoskeletal: Nontender with normal range of motion in extremities. No lower extremity tenderness nor edema. Neurologic:  Normal speech and language. No gross focal neurologic deficits are appreciated.  Skin:  Skin is warm, dry and intact. No rash noted. Psychiatric: Mood and affect are normal. Speech and behavior are normal.   ____________________________________________  EKG: Interpreted by me.sinus tachycardia with a rate of 108 bpm, normal PR interval, normal QRS, normal QT.  ____________________________________________  ED COURSE:  Pertinent labs & imaging results that were available during my care of the patient were reviewed by me and considered in my medical decision making (see chart for details). Patient presents for weakness and palpitations, we will assess with labs and imaging as indicated.   Procedures ____________________________________________   LABS (pertinent positives/negatives)  Labs Reviewed  BASIC METABOLIC PANEL - Abnormal; Notable for the following:       Result Value   Glucose, Bld 100 (*)    All other components within normal limits  CBC - Abnormal; Notable for the following:    Hemoglobin 11.8 (*)    RDW 14.6 (*)    All other components within normal limits  URINALYSIS, COMPLETE (UACMP) WITH MICROSCOPIC - Abnormal; Notable for the following:    Color, Urine YELLOW (*)    APPearance HAZY (*)    Hgb urine dipstick SMALL (*)    Squamous Epithelial / LPF 6-30 (*)    All other components within normal limits  FIBRIN DERIVATIVES D-DIMER ([facility] ONLY) - Abnormal; Notable for the following:    Fibrin derivatives D-dimer ([facility]) 604.31 (*)    All other components within normal limits  TSH  PREGNANCY, URINE  CBG MONITORING, ED    RADIOLOGY Images were viewed by me  CT angiogram of the chest  IMPRESSION: No evidence of a pulmonary embolus. ____________________________________________  FINAL ASSESSMENT AND PLAN  weakness, palpitations  Plan: Patient's labs and imaging were dictated above. Patient had presented for weakness and palpitations and symptoms more consistent with anxiety than with any other etiology. D-dimer was negative but CT angiogram was not indicative of any acute process. She is stable for outpatient follow-up.   Emily FilbertWilliams, Kenyatta Gloeckner E, MD   Note: This  note was generated in part or whole with voice recognition software. Voice recognition is usually quite accurate but there are transcription errors that can and very often do occur. I apologize for any typographical errors that were not detected and corrected.     Emily FilbertWilliams, Delancey Moraes E, MD 08/01/17 1254

## 2017-08-01 NOTE — ED Notes (Signed)
Pt signed hard copy. 

## 2017-08-05 ENCOUNTER — Encounter: Payer: Self-pay | Admitting: Family Medicine

## 2017-08-23 ENCOUNTER — Inpatient Hospital Stay: Payer: BLUE CROSS/BLUE SHIELD

## 2017-08-24 ENCOUNTER — Other Ambulatory Visit: Payer: BLUE CROSS/BLUE SHIELD

## 2017-08-24 ENCOUNTER — Inpatient Hospital Stay: Payer: BLUE CROSS/BLUE SHIELD

## 2017-08-24 ENCOUNTER — Inpatient Hospital Stay: Payer: BLUE CROSS/BLUE SHIELD | Admitting: Hematology and Oncology

## 2017-08-24 NOTE — Progress Notes (Deleted)
[facility] day:  08/24/2017  Chief Complaint: Jasmine Gordon is a 31 y.o. female with iron deficiency anemia who is seen for 4 month assessment on oral iron.  HPI:  The patient was last seen in the hematology clinic on 04/21/2017.  At that time, she remains fatigued.  Exam was unremarkable.  Hematocrit had dropped from 35.6 to 31.8 despite oral iron.  We discussed initiation of Venofer.  She received Venofer weekly x 3 (04/27/2017 - 05/10/2017).  CBC on 06/22/2017 revealed a hematocrit of 34.4, hemoglobin 11.4, and MCV 79.9.  Ferritin was 63.    Shh was seen in the [facility] ER on 08/01/2017 with weakness and dizziness.  CBC revealed a hematocrit of 35, hemoglobin 11.8, and MCV 80.5.  D-dimers were negative.  EKG revealed sinus tachycardia (108).  Symptoms were felt secondary to anxiety.   Past Medical History:  Diagnosis Date  . Allergy   . Anemia   . Anxiety   . Asthma   . Bronchiolitis   . Depression   . Migraine   . Panic attack   . PTSD (post-traumatic stress disorder) 2016    Past Surgical History:  Procedure Laterality Date  . APPENDECTOMY    . Plantar warts      Family History  Problem Relation Age of Onset  . Depression Mother   . Mental retardation Mother   . Bipolar disorder Mother   . Diabetes Father   . Stroke Maternal Grandfather   . Diabetes Maternal Grandfather     Social History:  reports that she has never smoked. She has never used smokeless tobacco. She reports that she does not drink alcohol or use drugs.  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  She is off 3 days/week.  She has 2 children with ADHD. The patient is alone today.  Allergies:  Allergies  Allergen Reactions  . Asa [Aspirin] Hives and Shortness Of Breath  . Reglan [Metoclopramide] Hives and Shortness Of Breath  . Turmeric     Current  Medications: Current Outpatient Prescriptions  Medication Sig Dispense Refill  . albuterol (PROVENTIL HFA;VENTOLIN HFA) 108 (90 Base) MCG/ACT inhaler Inhale 2 puffs into the lungs every 6 (six) hours as needed for wheezing or shortness of breath. 1 Inhaler 11  . albuterol (PROVENTIL) (2.5 MG/3ML) 0.083% nebulizer solution Take 3 mLs (2.5 mg total) by nebulization every 6 (six) hours as needed for wheezing or shortness of breath. 150 mL 2  . azelastine (OPTIVAR) 0.05 % ophthalmic solution   3  . BREO ELLIPTA 100-25 MCG/INH AEPB INL 1 PUFF PO QD  3  . diazepam (VALIUM) 2 MG tablet Take 1 tablet (2 mg total) by mouth every 8 (eight) hours as needed for anxiety. 30 tablet 0  . EPINEPHrine 0.3 mg/0.3 mL IJ SOAJ injection INJECT INTRAMUSCULARLY AS DIRECTED  0  . FLOVENT HFA 220 MCG/ACT inhaler   11  . fluticasone (FLONASE) 50 MCG/ACT nasal spray Place 1 spray into both nostrils daily. 1 g 0  . loratadine (CLARITIN) 10 MG tablet Take 1 tablet (10 mg total) by mouth daily. 30 tablet 0  . LORazepam (ATIVAN) 0.5 MG tablet Take 1 tablet (0.5 mg total) by mouth every 8 (eight) hours as needed for anxiety. 30 tablet 0  . magic mouthwash w/lidocaine SOLN Take 5 mLs by mouth 4 (four) times  daily. 120 mL 0  . methylPREDNISolone (MEDROL DOSEPAK) 4 MG TBPK tablet Take Tapered dose as directed on Sunday morning. 21 tablet 0  . montelukast (SINGULAIR) 10 MG tablet TK 1 T PO QD IN THE EVE  3  . Multiple Vitamins-Minerals (MULTIVITAMIN WITH MINERALS) tablet Take 1 tablet by mouth daily.    . ranitidine (ZANTAC) 150 MG tablet Take 1 tablet (150 mg total) by mouth 2 (two) times daily. 14 tablet 0   No current facility-administered medications for this visit.     Review of Systems:  GENERAL:  Tired.  No fevers or sweats.  Weight up 7 pounds. PERFORMANCE STATUS (ECOG):  1 HEENT:  No visual changes, runny nose, sore throat, mouth sores or tenderness. Lungs: No shortness of breath or cough.  No hemoptysis. Cardiac:   No chest pain, palpitations, orthopnea, or PND. GI:  No nausea, vomiting, diarrhea, constipation, melena or hematochezia. GU:  History of heavy menses, improved.  No urgency, frequency, dysuria, or hematuria. Musculoskeletal:  No back pain.  No joint pain.  No muscle tenderness. Extremities:  No pain or swelling. Skin:  No rashes or skin changes. Neuro:  No headache, numbness or weakness, balance or coordination issues. Endocrine:  No diabetes, thyroid issues, hot flashes or night sweats.  Feels cold. Psych:  Anxiety, less.  No mood changes.  No depression. Pain:  No focal pain. Review of systems:  All other systems reviewed and found to be negative.  Physical Exam: There were no vitals taken for this visit. GENERAL:  Well developed, well nourished, woman sitting comfortably in the exam room in no acute distress. MENTAL STATUS:  Alert and oriented to person, place and time. HEAD:  Long black hair pulled up (braided with purple streaks).  Normocephalic, atraumatic, face symmetric, no Cushingoid features. EYES:  Glasses.  brown eyes.  Pupils equal round and reactive to light and accomodation.  No conjunctivitis or scleral icterus. ENT:  Oropharynx clear without lesion.  Tongue normal. Mucous membranes moist.  RESPIRATORY:  Clear to auscultation without rales, wheezes or rhonchi. CARDIOVASCULAR:  Regular rate and rhythm without murmur, rub or gallop. ABDOMEN:  Soft, non-tender, with active bowel sounds, and no appreciable hepatosplenomegaly.  No masses. SKIN:  No rashes, ulcers or lesions. EXTREMITIES: No edema, no skin discoloration or tenderness.  No palpable cords. LYMPH NODES: No palpable cervical, supraclavicular, axillary or inguinal adenopathy  NEUROLOGICAL: Unremarkable. PSYCH:  Appropriate.   No visits with results within 3 Day(s) from this visit.  Latest known visit with results is:  Admission on 08/01/2017, Discharged on 08/01/2017  Component Date Value Ref Range Status  .  Sodium 08/01/2017 139  135 - 145 mmol/L Final  . Potassium 08/01/2017 3.9  3.5 - 5.1 mmol/L Final  . Chloride 08/01/2017 109  101 - 111 mmol/L Final  . CO2 08/01/2017 23  22 - 32 mmol/L Final  . Glucose, Bld 08/01/2017 100* 65 - 99 mg/dL Final  . BUN 08/01/2017 15  6 - 20 mg/dL Final  . Creatinine, Ser 08/01/2017 0.68  0.44 - 1.00 mg/dL Final  . Calcium 08/01/2017 9.1  8.9 - 10.3 mg/dL Final  . GFR calc non Af Amer 08/01/2017 >60  >60 mL/min Final  . GFR calc Af Amer 08/01/2017 >60  >60 mL/min Final   Comment: (NOTE) The eGFR has been calculated using the CKD EPI equation. This calculation has not been validated in all clinical situations. eGFR's persistently <60 mL/min signify possible Chronic Kidney Disease.   Georgiann Hahn  gap 08/01/2017 7  5 - 15 Final  . WBC 08/01/2017 4.3  3.6 - 11.0 K/uL Final  . RBC 08/01/2017 4.35  3.80 - 5.20 MIL/uL Final  . Hemoglobin 08/01/2017 11.8* 12.0 - 16.0 g/dL Final  . HCT 08/01/2017 35.0  35.0 - 47.0 % Final  . MCV 08/01/2017 80.5  80.0 - 100.0 fL Final  . MCH 08/01/2017 27.1  26.0 - 34.0 pg Final  . MCHC 08/01/2017 33.7  32.0 - 36.0 g/dL Final  . RDW 08/01/2017 14.6* 11.5 - 14.5 % Final  . Platelets 08/01/2017 204  150 - 440 K/uL Final  . Color, Urine 08/01/2017 YELLOW* YELLOW Final  . APPearance 08/01/2017 HAZY* CLEAR Final  . Specific Gravity, Urine 08/01/2017 1.027  1.005 - 1.030 Final  . pH 08/01/2017 5.0  5.0 - 8.0 Final  . Glucose, UA 08/01/2017 NEGATIVE  NEGATIVE mg/dL Final  . Hgb urine dipstick 08/01/2017 SMALL* NEGATIVE Final  . Bilirubin Urine 08/01/2017 NEGATIVE  NEGATIVE Final  . Ketones, ur 08/01/2017 NEGATIVE  NEGATIVE mg/dL Final  . Protein, ur 08/01/2017 NEGATIVE  NEGATIVE mg/dL Final  . Nitrite 08/01/2017 NEGATIVE  NEGATIVE Final  . Leukocytes, UA 08/01/2017 NEGATIVE  NEGATIVE Final  . RBC / HPF 08/01/2017 0-5  0 - 5 RBC/hpf Final  . WBC, UA 08/01/2017 0-5  0 - 5 WBC/hpf Final  . Bacteria, UA 08/01/2017 NONE SEEN  NONE SEEN  Final  . Squamous Epithelial / LPF 08/01/2017 6-30* NONE SEEN Final  . Mucus 08/01/2017 PRESENT   Final  . Fibrin derivatives D-dimer ([facility]) 08/01/2017 604.31* 0.00 - 499.00 Final   Comment: (NOTE) <> Exclusion of Venous Thromboembolism (VTE) - OUTPATIENT ONLY   (Emergency Department or [place])   0-499 ng/ml (FEU): With a low to intermediate pretest probability                      for VTE this test result excludes the diagnosis                      of VTE.   >499 ng/ml (FEU) : VTE not excluded; additional work up for VTE is                      required. <> Testing on Inpatients and Evaluation of Disseminated Intravascular   Coagulation (DIC) Reference Range:   0-499 ng/ml (FEU)   . TSH 08/01/2017 3.476  0.350 - 4.500 uIU/mL Final   Performed by a 3rd Generation assay with a functional sensitivity of <=0.01 uIU/mL.  . Preg Test, Ur 08/01/2017 NEGATIVE  NEGATIVE Final    Assessment:  Brie Christianne Zacher is a 31 y.o. female with iron deficiency anemia likely due to heavy menses.  Menses improved after IUD placement.  Diet appears good.  She denies any melena, hematochezia, or hematuria.  Urinalysis reveals no blood.  Work-up on 10/03/2016 revealed a hematocrit 34.2, hemoglobin 11.1, MCV 79.5, platelets 196,000, white count 4700 with an ANC of 2800.  Creatinine was 0.81.  Liver function tests were normal.  Ferritin was 12.  Iron studies included a saturation of 12% and TIBC of 340.  Urinalysis revealed no RBCs on 08/25/2016.  She has been on oral iron for several months.  She takes oral iron "sometimes" secondary to tolerance.  She is on a MVI with iron.  Symptomatically, she remains fatigued.  Exam is unremarkable.  Hematocrit has dropped from 35.6 to 31.8 despite oral iron.  Plan: 1.  Labs today: CBC with diff and ferritin.  2.  Discuss drop in hematocrit despite ongoing oral iron.  Encourage patient to turn in guaiac cards.  Discuss IV iron.  Discuss risks versus benefits.  Patient  is agreeable to IV iron. 3.  Venofer weeky x 3.  Begin 04/27/2017. 4.  RTC in 2 months for labs (CBC with diff, ferritin). 5.  RTC in 4 months for MD assessment, labs (CBC with diff, ferritin-day before), and +/- Venofer.   Lequita Asal, MD  08/24/2017, 6:18 AM

## 2017-09-06 ENCOUNTER — Encounter: Payer: Self-pay | Admitting: Emergency Medicine

## 2017-09-06 ENCOUNTER — Emergency Department
Admission: EM | Admit: 2017-09-06 | Discharge: 2017-09-06 | Disposition: A | Discharge status: Home / Self Care | Payer: 59 | Attending: Emergency Medicine | Admitting: Emergency Medicine

## 2017-09-06 DIAGNOSIS — R110 Nausea: Secondary | ICD-10-CM | POA: Diagnosis not present

## 2017-09-06 DIAGNOSIS — J45909 Unspecified asthma, uncomplicated: Secondary | ICD-10-CM | POA: Diagnosis not present

## 2017-09-06 DIAGNOSIS — R143 Flatulence: Secondary | ICD-10-CM | POA: Insufficient documentation

## 2017-09-06 DIAGNOSIS — R109 Unspecified abdominal pain: Secondary | ICD-10-CM

## 2017-09-06 LAB — COMPREHENSIVE METABOLIC PANEL
ALT: 13 U/L — ABNORMAL LOW (ref 14–54)
AST: 17 U/L (ref 15–41)
Albumin: 4.1 g/dL (ref 3.5–5.0)
Alkaline Phosphatase: 43 U/L (ref 38–126)
Anion gap: 7 (ref 5–15)
BUN: 12 mg/dL (ref 6–20)
CO2: 22 mmol/L (ref 22–32)
Calcium: 9.2 mg/dL (ref 8.9–10.3)
Chloride: 108 mmol/L (ref 101–111)
Creatinine, Ser: 0.74 mg/dL (ref 0.44–1.00)
GFR calc Af Amer: >60 mL/min (ref >60)
GFR calc non Af Amer: >60 mL/min (ref >60)
Glucose, Bld: 96 mg/dL (ref 65–99)
Potassium: 4 mmol/L (ref 3.5–5.1)
Sodium: 137 mmol/L (ref 135–145)
Total Bilirubin: 0.4 mg/dL (ref 0.3–1.2)
Total Protein: 7.5 g/dL (ref 6.5–8.1)

## 2017-09-06 LAB — URINALYSIS, COMPLETE (UACMP) WITH MICROSCOPIC
Bacteria, UA: NONE SEEN
Bilirubin Urine: NEGATIVE
Glucose, UA: NEGATIVE mg/dL
Hgb urine dipstick: NEGATIVE
Ketones, ur: NEGATIVE mg/dL
Leukocytes, UA: NEGATIVE
Nitrite: NEGATIVE
Protein, ur: NEGATIVE mg/dL
RBC / HPF: NONE SEEN RBC/hpf (ref 0–5)
Specific Gravity, Urine: 1.025 (ref 1.005–1.030)
WBC, UA: NONE SEEN WBC/hpf (ref 0–5)
pH: 6 (ref 5.0–8.0)

## 2017-09-06 LAB — CBC
HCT: 35.8 % (ref 35.0–47.0)
Hemoglobin: 11.9 g/dL — ABNORMAL LOW (ref 12.0–16.0)
MCH: 27.2 pg (ref 26.0–34.0)
MCHC: 33.4 g/dL (ref 32.0–36.0)
MCV: 81.6 fL (ref 80.0–100.0)
Platelets: 208 10*3/uL (ref 150–440)
RBC: 4.38 MIL/uL (ref 3.80–5.20)
RDW: 13.8 % (ref 11.5–14.5)
WBC: 3.9 10*3/uL (ref 3.6–11.0)

## 2017-09-06 LAB — LIPASE, BLOOD: Lipase: 19 U/L (ref 11–51)

## 2017-09-06 LAB — POCT PREGNANCY, URINE: Preg Test, Ur: NEGATIVE

## 2017-09-06 MED ORDER — SIMETHICONE 80 MG PO CHEW: 80.0000 mg | CHEWABLE_TABLET | Freq: Four times a day (QID) | ORAL | 0 refills | Status: DC | PRN

## 2017-09-06 MED ORDER — OMEPRAZOLE 40 MG PO CPDR: 40.0000 mg | DELAYED_RELEASE_CAPSULE | Freq: Every day | ORAL | 0 refills | Status: DC

## 2017-09-06 MED ORDER — GI COCKTAIL ~~LOC~~
30.0000 mL | Freq: Once | ORAL | Status: AC
  Administered 2017-09-06: 30 mL via ORAL
  Filled 2017-09-06: qty 30

## 2017-09-06 MED ORDER — SIMETHICONE 40 MG/0.6ML PO SUSP (UNIT DOSE)
40.0000 mg | Freq: Once | ORAL | Status: AC
  Administered 2017-09-06: 40 mg via ORAL
  Filled 2017-09-06: qty 0.6

## 2017-09-06 MED ORDER — ONDANSETRON 4 MG PO TBDP: 4.0000 mg | ORAL_TABLET | Freq: Three times a day (TID) | ORAL | 0 refills | Status: DC | PRN

## 2017-09-06 NOTE — ED Triage Notes (Signed)
Pt reports generalized abdominal pain for the past week, reports nausea and decreased appetite, denies any vomiting. Pt denies any dysuria. Pt describes the pain as pressure. Pt has IUD.

## 2017-09-06 NOTE — ED Provider Notes (Signed)
[facility] Emergency Department Provider Note  ____________________________________________  Time seen: Approximately 9:04 AM  I have reviewed the triage vital signs and the nursing notes.   HISTORY  Chief Complaint Abdominal Pain    HPI Jasmine Gordon is a 31 y.o. female with a history of morbid obesity presenting withabdominal pain. The patient reports that for the past week, she has had a feeling of "fullness" with associated significant flatulence. She has had intermittent abdominal cramping once or twice a day "when I feel like I have to poop." Sometimes her symptoms are relieved after defecation but sometimes they are not.earlier this week she had some constipation, which she treated with milk of magnesia, resulting in normal stools. No diarrhea. Her symptoms are worse when she lays down. She has had some mild nausea without any vomiting. No dysuria, urinary frequency. LMP was one month ago. No fevers or chills.  She had similar symptoms approximately one month ago and states that her symptoms were "cured" with a GI cocktail in the emergency department.   Past Medical History:  Diagnosis Date  . Allergy   . Anemia   . Anxiety   . Asthma   . Bronchiolitis   . Depression   . Migraine   . Panic attack   . PTSD (post-traumatic stress disorder) 2016    Patient Active Problem List   Diagnosis Date Noted  . Allergic contact dermatitis due to metals 05/17/2017  . Pre-diabetes 03/06/2017  . Hyperlipidemia 03/06/2017  . Allergic reaction 02/08/2017  . Urticaria due to food allergy 02/08/2017  . Vitamin D deficiency 11/08/2016  . Iron deficiency anemia due to chronic blood loss 10/03/2016  . Menorrhagia with regular cycle 06/27/2016  . Mild persistent asthma 06/17/2016  . Anxiety and depression 06/17/2016  . Morbid obesity with BMI of 40.0-44.9, adult (HCC) 06/17/2016    Past Surgical History:  Procedure Laterality Date  . APPENDECTOMY    .  Plantar warts      Current Outpatient Rx  . Order #: [id number] Class: Print  . Order #: [id number] Class: Normal  . Order #: [id number] Class: Historical Med  . Order #: [id number] Class: Historical Med  . Order #: [id number] Class: Print  . Order #: [id number] Class: Historical Med  . Order #: [id number] Class: Historical Med  . Order #: [id number] Class: Print  . Order #: [id number] Class: Print  . Order #: [id number] Class: Print  . Order #: [id number] Class: Print  . Order #: [id number] Class: Print  . Order #: [id number] Class: Historical Med  . Order #: [id number] Class: Historical Med  . Order #: [id number] Class: Print  . Order #: [id number] Class: Print  . Order #: [id number] Class: Print  . Order #: [id number] Class: Print    Allergies Reglan [metoclopramide] and Turmeric  Family History  Problem Relation Age of Onset  . Depression Mother   . Mental retardation Mother   . Bipolar disorder Mother   . Diabetes Father   . Stroke Maternal Grandfather   . Diabetes Maternal Grandfather     Social History Social History  Substance Use Topics  . Smoking status: Never Smoker  . Smokeless tobacco: Never Used  . Alcohol use No    Review of Systems Constitutional: No fever/chills.no lightheadedness or syncope. Eyes: No visual changes. ENT: No sore throat. No congestion or rhinorrhea. Cardiovascular: Denies chest pain. Denies palpitations. Respiratory: Denies shortness of breath.  No cough. Gastrointestinal: positive diffuse abdominal ramping.  positivenausea, no vomiting.  No diarrhea.  positiveconstipation.positive flatulence. Genitourinary: Negative for dysuria.no urinary frequency. LMP 1 month  ago. No change in vaginal discharge. Musculoskeletal: Negative for back pain. Skin: Negative for rash. Neurological: Negative for headaches. No focal numbness, tingling or weakness.     ____________________________________________   PHYSICAL EXAM:  VITAL SIGNS: ED Triage Vitals  Enc Vitals Group      BP 09/06/17 0832 121/73     Pulse Rate 09/06/17 0832 86     Resp 09/06/17 0832 18     Temp 09/06/17 0832 98.8 F (37.1 C)     Temp Source 09/06/17 0832 Oral     SpO2 09/06/17 0832 100 %     Weight 09/06/17 0832 261 lb (118.4 kg)     Height 09/06/17 0832 5\' 7"  (1.702 m)     Head Circumference --      Peak Flow --      Pain Score 09/06/17 0831 7     Pain Loc --      Pain Edu? --      Excl. in GC? --     Constitutional: Alert and oriented. Well appearing and in no acute distress. Answers questions appropriately. Eyes: Conjunctivae are normal.  EOMI. No scleral icterus. Head: Atraumatic. Nose: No congestion/rhinnorhea. Mouth/Throat: Mucous membranes are moist.  Neck: No stridor.  Supple.  No JVD. No meningismus. Cardiovascular: Normal rate, regular rhythm. No murmurs, rubs or gallops.  Respiratory: Normal respiratory effort.  No accessory muscle use or retractions. Lungs CTAB.  No wheezes, rales or ronchi. Gastrointestinal: morbidly obese. Soft, nontender and nondistended.  I'm unable to reproduce any discomfort at this time. No guarding or rebound.  No peritoneal signs. Musculoskeletal: No LE edema.  Neurologic:  A&Ox3.  Speech is clear.  Face and smile are symmetric.  EOMI.  Moves all extremities well. Skin:  Skin is warm, dry and intact. No rash noted. Psychiatric: Mood and affect are normal. Speech and behavior are normal.  Normal judgement.  ____________________________________________   LABS (all labs ordered are listed, but only abnormal results are displayed)  Labs Reviewed  LIPASE, BLOOD  COMPREHENSIVE METABOLIC PANEL  URINALYSIS, COMPLETE (UACMP) WITH MICROSCOPIC  CBC  POC URINE PREG, ED  POC URINE PREG, ED   ____________________________________________  EKG  ED ECG REPORT I, Rockne Menghini, the attending physician, personally viewed and interpreted this ECG.   Date: 09/06/2017  EKG Time: 916  Rate: 81  Rhythm: normal sinus rhythm  Axis:  normal  Intervals:none  ST&T Change: No STEMI  ____________________________________________  RADIOLOGY  No results found.  ____________________________________________   PROCEDURES  Procedure(s) performed: None  Procedures  Critical Care performed: No ____________________________________________   INITIAL IMPRESSION / ASSESSMENT AND PLAN / ED COURSE  Pertinent labs & imaging results that were available during my care of the patient were reviewed by me and considered in my medical decision making (see chart for details).  31 y.o. Female with obesity presenting with intermittent episodes of abdominal cramping associated with nausea, significant flatulence. Overall, the patient is hemodynamically stable and afebrile. Her abdominal examination is reassuring and it is unlikely that she has no acute surgical or infectious pathology today. Abdominal imaging is not indicated at this time, but we will follow her laboratory studies for further evaluation. The patient's symptoms may be related to reflux as her symptoms are worse when she lays down, so I'll treat her with a GI cocktail here and send her home with up her prescription for omeprazole. The patient may also be experiencing a significant amount of gas and flatulence, so she'll be treated with simethicone for  that. Encouraged the patient to follow up with her primary care physician. If her laboratory studies are reassuring and she is feeling better,plan will be for discharge. Follow-up instructions as well as return precautions were discussed.  ____________________________________________  FINAL CLINICAL IMPRESSION(S) / ED DIAGNOSES  Final diagnoses:  Abdominal cramping  Nausea without vomiting  Flatulence         NEW MEDICATIONS STARTED DURING THIS VISIT:  New Prescriptions   OMEPRAZOLE (PRILOSEC) 40 MG CAPSULE    Take 1 capsule (40 mg total) by mouth daily.   ONDANSETRON (ZOFRAN ODT) 4 MG DISINTEGRATING TABLET    Take  1 tablet (4 mg total) by mouth every 8 (eight) hours as needed for nausea or vomiting.   SIMETHICONE (GAS-X) 80 MG CHEWABLE TABLET    Chew 1 tablet (80 mg total) by mouth 4 (four) times daily as needed for flatulence.      Rockne MenghiniNorman, Anne-Caroline, MD 09/06/17 1105

## 2017-09-06 NOTE — Discharge Instructions (Signed)
Please follow the instructions for food choices for gastroesophageal reflux, and make sure you are eating a high-fiber diet. Do not eat very large meals, and avoid spicy and fried foods.  You may take simethicone for gas,and Zofran for nausea. Omeprazole is for reflux, and his medication that you take every day regardless of symptoms; it will prevent symptoms of reflux.  Return to the emergency department if you develop severe pain, fever, inability to keep down fluids, or any other symptoms concerning to you.

## 2017-09-13 ENCOUNTER — Ambulatory Visit (INDEPENDENT_AMBULATORY_CARE_PROVIDER_SITE_OTHER): Payer: BLUE CROSS/BLUE SHIELD

## 2017-09-13 DIAGNOSIS — Z0000 Encounter for general adult medical examination without abnormal findings: Secondary | ICD-10-CM | POA: Diagnosis not present

## 2017-09-15 ENCOUNTER — Ambulatory Visit
Admission: RE | Admit: 2017-09-15 | Discharge: 2017-09-15 | Disposition: A | Discharge status: Home / Self Care | Payer: 59 | Source: Ambulatory Visit | Attending: Family Medicine | Admitting: Family Medicine

## 2017-09-15 ENCOUNTER — Other Ambulatory Visit: Payer: Self-pay | Admitting: Family Medicine

## 2017-09-15 ENCOUNTER — Encounter: Payer: Self-pay | Admitting: Family Medicine

## 2017-09-15 DIAGNOSIS — R7611 Nonspecific reaction to tuberculin skin test without active tuberculosis: Secondary | ICD-10-CM

## 2017-09-15 LAB — TB SKIN TEST
Induration: 15 mm
TB Skin Test: POSITIVE

## 2017-09-23 LAB — QUANTIFERON-TB GOLD PLUS
QuantiFERON Mitogen Value: >10 IU/mL
QuantiFERON Nil Value: 0.18 IU/mL
QuantiFERON TB1 Ag Value: 0.55 IU/mL
QuantiFERON TB2 Ag Value: 0.52 IU/mL
QuantiFERON-TB Gold Plus: POSITIVE — AB

## 2017-09-25 ENCOUNTER — Encounter: Payer: Self-pay | Admitting: Family Medicine

## 2017-09-25 DIAGNOSIS — R7611 Nonspecific reaction to tuberculin skin test without active tuberculosis: Secondary | ICD-10-CM | POA: Insufficient documentation

## 2017-09-25 NOTE — Telephone Encounter (Signed)
Called patient now after last mychart message, I spoke again with Richmond CampbellMichelle Dorminy at [facility], and she confirmed that as long as treatment has been completed at any point in time, we do not need to repeat treatment again. Even if it has been >10 years or more, we do not need repeat treatment, unless there was any clinical change or new exposure.  Patient is now reassured. No other follow-up needed at this time. She will notify us if any clinical changes in future. Otherwise, if needs TB Screening again in future she will follow-up with [facility] for TB Screening and Chest X-ray / documentation instead of our office.  Saralyn PilarAlexander Saleen Peden, DO [facility] 09/25/2017, 2:13 PM

## 2017-11-09 ENCOUNTER — Encounter (INDEPENDENT_AMBULATORY_CARE_PROVIDER_SITE_OTHER): Payer: Self-pay

## 2017-11-09 DIAGNOSIS — Z711 Person with feared health complaint in whom no diagnosis is made: Secondary | ICD-10-CM | POA: Insufficient documentation

## 2017-11-09 DIAGNOSIS — R0602 Shortness of breath: Secondary | ICD-10-CM | POA: Diagnosis present

## 2017-11-09 DIAGNOSIS — J4530 Mild persistent asthma, uncomplicated: Secondary | ICD-10-CM | POA: Insufficient documentation

## 2017-11-09 DIAGNOSIS — Z79899 Other long term (current) drug therapy: Secondary | ICD-10-CM | POA: Diagnosis not present

## 2017-11-09 LAB — POCT PREGNANCY, URINE: Preg Test, Ur: NEGATIVE

## 2017-11-09 NOTE — ED Triage Notes (Signed)
Patient c/o SOB, chest discomfort, generalized abdominal pain, diarrhea, rash to face. Patient worried she may be having an allergic reaction. Patient reports she drank multiple caffineated drinks today - not normal for patient

## 2017-11-09 NOTE — ED Triage Notes (Signed)
Patient c/o cough.

## 2017-11-10 ENCOUNTER — Emergency Department: Payer: BLUE CROSS/BLUE SHIELD

## 2017-11-10 ENCOUNTER — Emergency Department
Admission: EM | Admit: 2017-11-10 | Discharge: 2017-11-10 | Disposition: A | Discharge status: Home / Self Care | Payer: BLUE CROSS/BLUE SHIELD | Attending: Emergency Medicine | Admitting: Emergency Medicine

## 2017-11-10 DIAGNOSIS — Z0000 Encounter for general adult medical examination without abnormal findings: Secondary | ICD-10-CM

## 2017-11-10 LAB — COMPREHENSIVE METABOLIC PANEL
ALT: 11 U/L — ABNORMAL LOW (ref 14–54)
AST: 17 U/L (ref 15–41)
Albumin: 4.1 g/dL (ref 3.5–5.0)
Alkaline Phosphatase: 51 U/L (ref 38–126)
Anion gap: 7 (ref 5–15)
BUN: 14 mg/dL (ref 6–20)
CO2: 23 mmol/L (ref 22–32)
Calcium: 9.1 mg/dL (ref 8.9–10.3)
Chloride: 106 mmol/L (ref 101–111)
Creatinine, Ser: 0.67 mg/dL (ref 0.44–1.00)
GFR calc Af Amer: >60 mL/min (ref >60)
GFR calc non Af Amer: >60 mL/min (ref >60)
Glucose, Bld: 97 mg/dL (ref 65–99)
Potassium: 3.5 mmol/L (ref 3.5–5.1)
Sodium: 136 mmol/L (ref 135–145)
Total Bilirubin: 0.2 mg/dL — ABNORMAL LOW (ref 0.3–1.2)
Total Protein: 7.3 g/dL (ref 6.5–8.1)

## 2017-11-10 LAB — CBC
HCT: 34.6 % — ABNORMAL LOW (ref 35.0–47.0)
Hemoglobin: 11 g/dL — ABNORMAL LOW (ref 12.0–16.0)
MCH: 25.8 pg — ABNORMAL LOW (ref 26.0–34.0)
MCHC: 31.7 g/dL — ABNORMAL LOW (ref 32.0–36.0)
MCV: 81.6 fL (ref 80.0–100.0)
Platelets: 219 10*3/uL (ref 150–440)
RBC: 4.24 MIL/uL (ref 3.80–5.20)
RDW: 13.7 % (ref 11.5–14.5)
WBC: 5.1 10*3/uL (ref 3.6–11.0)

## 2017-11-10 LAB — URINALYSIS, COMPLETE (UACMP) WITH MICROSCOPIC
Bacteria, UA: NONE SEEN
Bilirubin Urine: NEGATIVE
Glucose, UA: NEGATIVE mg/dL
Ketones, ur: NEGATIVE mg/dL
Leukocytes, UA: NEGATIVE
Nitrite: NEGATIVE
Protein, ur: NEGATIVE mg/dL
Specific Gravity, Urine: 1.017 (ref 1.005–1.030)
pH: 6 (ref 5.0–8.0)

## 2017-11-10 LAB — LIPASE, BLOOD: Lipase: 28 U/L (ref 11–51)

## 2017-11-10 LAB — TROPONIN I: Troponin I: <0.03 ng/mL (ref <0.03)

## 2017-11-10 NOTE — ED Notes (Signed)

## 2017-11-10 NOTE — ED Notes (Signed)
ED Provider at bedside. 

## 2017-11-10 NOTE — Discharge Instructions (Signed)
Your workup in the Emergency Department today was reassuring.  We did not find any specific abnormalities.  It is possible that, as you suggested, you may have had too many caffeinated beverages today which led to some of your symptoms.  We recommend you drink plenty of fluids, take your regular medications and/or any new ones prescribed today, and follow up with the doctor(s) listed in these documents as recommended.  Return to the Emergency Department if you develop new or worsening symptoms that concern you.

## 2017-11-10 NOTE — ED Provider Notes (Signed)
[facility] Emergency Department Provider Note  ____________________________________________   First MD Initiated Contact with Patient 11/10/17 [phone]     (approximate)  I have reviewed the triage vital signs and the nursing notes.   HISTORY  Chief Complaint Shortness of Breath and Abdominal Pain    HPI Leisha Thana AtesDavis Farmer is a 31 y.o. female with medical history as listed below with medical history as listed below who presents for evaluation of a variety of complaints that include some mild shortness of breath, a very fine, mild rash on the side of her face, some generalized abdominal cramping, 2 episodes of loose stools, and feeling jittery.  She states that she drank numerous caffeinated beverages and coffee is today and thinks maybe she overdid it.  She has had similar symptoms in the past; this is her 10th visit to the emergency department this calendar year, and multiple visits have involved shortness of breath, chest pain, and generalized abdominal pain with reports of being "gassy" and having loose stools.  She describes her symptoms as severe previously but now they are almost resolved.  Nothing particular makes her symptoms better or worse, though she does think that taking a Benadryl prior to coming over here may have helped resolve the rash on her face.  She is in no acute distress at this time.  Past Medical History:  Diagnosis Date  . Allergy   . Anemia   . Anxiety   . Asthma   . Bronchiolitis   . Depression   . Migraine   . Panic attack   . PTSD (post-traumatic stress disorder) 2016    Patient Active Problem List   Diagnosis Date Noted  . Positive TB test 09/25/2017  . Allergic contact dermatitis due to metals 05/17/2017  . Pre-diabetes 03/06/2017  . Hyperlipidemia 03/06/2017  . Allergic reaction 02/08/2017  . Urticaria due to food allergy 02/08/2017  . Vitamin D deficiency 11/08/2016  . Iron deficiency anemia due to chronic blood  loss 10/03/2016  . Menorrhagia with regular cycle 06/27/2016  . Mild persistent asthma 06/17/2016  . Anxiety and depression 06/17/2016  . Morbid obesity with BMI of 40.0-44.9, adult (HCC) 06/17/2016    Past Surgical History:  Procedure Laterality Date  . APPENDECTOMY    . Plantar warts      Prior to Admission medications   Medication Sig Start Date End Date Taking? Authorizing Provider  albuterol (PROVENTIL HFA;VENTOLIN HFA) 108 (90 Base) MCG/ACT inhaler Inhale 2 puffs into the lungs every 6 (six) hours as needed for wheezing or shortness of breath. 06/17/16   Krebs, Laurel DimmerAmy Lauren, NP  albuterol (PROVENTIL) (2.5 MG/3ML) 0.083% nebulizer solution Take 3 mLs (2.5 mg total) by nebulization every 6 (six) hours as needed for wheezing or shortness of breath. 11/11/16   Karamalegos, Netta NeatAlexander J, DO  azelastine (OPTIVAR) 0.05 % ophthalmic solution  02/28/17   [provider]  diazepam (VALIUM) 2 MG tablet Take 1 tablet (2 mg total) by mouth every 8 (eight) hours as needed for anxiety. 08/01/17 08/01/18  Emily FilbertWilliams, Jonathan E, MD  EPINEPHrine 0.3 mg/0.3 mL IJ SOAJ injection INJECT INTRAMUSCULARLY AS DIRECTED 01/29/17   [provider]  fluticasone (FLONASE) 50 MCG/ACT nasal spray Place 1 spray into both nostrils daily. 03/28/17 03/28/18  Rebecka ApleyWebster, Allison P, MD  loratadine (CLARITIN) 10 MG tablet Take 1 tablet (10 mg total) by mouth daily. 03/28/17 03/28/18  Rebecka ApleyWebster, Allison P, MD  LORazepam (ATIVAN) 0.5 MG tablet Take 1 tablet (0.5 mg total) by  mouth every 8 (eight) hours as needed for anxiety. 08/01/17 08/01/18  Emily Filbert, MD  magic mouthwash w/lidocaine SOLN Take 5 mLs by mouth 4 (four) times daily. Patient not taking: Reported on 09/06/2017 06/10/17   Joni Reining, PA-C  methylPREDNISolone (MEDROL DOSEPAK) 4 MG TBPK tablet Take Tapered dose as directed on Sunday morning. Patient not taking: Reported on 09/06/2017 06/10/17   Joni Reining, PA-C  omeprazole (PRILOSEC) 40 MG capsule  Take 1 capsule (40 mg total) by mouth daily. 09/06/17 09/06/18  Rockne Menghini, MD  ondansetron (ZOFRAN ODT) 4 MG disintegrating tablet Take 1 tablet (4 mg total) by mouth every 8 (eight) hours as needed for nausea or vomiting. 09/06/17   Rockne Menghini, MD  ranitidine (ZANTAC) 150 MG tablet Take 1 tablet (150 mg total) by mouth 2 (two) times daily. Patient not taking: Reported on 09/06/2017 02/11/17 02/11/18  Enid Derry, PA-C  simethicone (GAS-X) 80 MG chewable tablet Chew 1 tablet (80 mg total) by mouth 4 (four) times daily as needed for flatulence. 09/06/17 09/06/18  Rockne Menghini, MD    Allergies Reglan [metoclopramide] and Turmeric  Family History  Problem Relation Age of Onset  . Depression Mother   . Mental retardation Mother   . Bipolar disorder Mother   . Diabetes Father   . Stroke Maternal Grandfather   . Diabetes Maternal Grandfather     Social History Social History   Tobacco Use  . Smoking status: Never Smoker  . Smokeless tobacco: Never Used  Substance Use Topics  . Alcohol use: No  . Drug use: No    Review of Systems Constitutional: No fever/chills Eyes: No visual changes. ENT: No sore throat. Cardiovascular: Denies chest pain. Respiratory: Shortness of breath and mild cough, shortness of breath has resolved. Gastrointestinal: Vague lower abdominal discomfort.  No nausea, no vomiting.  2 episodes of loose stools.  No constipation. Genitourinary: Negative for dysuria. Musculoskeletal: Negative for neck pain.  Negative for back pain. Integumentary: Mild, fine, sandpapery rash on her face which has resolved Neurological: Negative for headaches, focal weakness or numbness.   ____________________________________________   PHYSICAL EXAM:  VITAL SIGNS: ED Triage Vitals  Enc Vitals Group     BP 11/09/17 2354 128/81     Pulse Rate 11/09/17 2354 (!) 104     Resp 11/09/17 2354 16     Temp 11/09/17 2354 98.3 F (36.8 C)     Temp  Source 11/09/17 2354 Oral     SpO2 11/09/17 2354 100 %     Weight 11/09/17 2354 118.4 kg (261 lb)     Height --      Head Circumference --      Peak Flow --      Pain Score 11/09/17 2353 5     Pain Loc --      Pain Edu? --      Excl. in GC? --     Constitutional: Alert and oriented. Well appearing and in no acute distress. Eyes: Conjunctivae are normal.  Head: Atraumatic. Neck: No stridor.  No meningeal signs.   Cardiovascular: Normal rate, regular rhythm. Good peripheral circulation. Grossly normal heart sounds. Respiratory: Normal respiratory effort.  No retractions. Lungs CTAB. Gastrointestinal: Soft and nontender. No distention.  Musculoskeletal: No lower extremity tenderness nor edema. No gross deformities of extremities. Neurologic:  Normal speech and language. No gross focal neurologic deficits are appreciated.  Skin:  Skin is warm, dry and intact. No rash noted.  I felt the sides of  her face and I do not appreciate a fine sandpapery rash anymore. Psychiatric: Mood and affect are normal. Speech and behavior are normal.  ____________________________________________   LABS (all labs ordered are listed, but only abnormal results are displayed)  Labs Reviewed  CBC - Abnormal; Notable for the following components:      Result Value   Hemoglobin 11.0 (*)    HCT 34.6 (*)    MCH 25.8 (*)    MCHC 31.7 (*)    All other components within normal limits  COMPREHENSIVE METABOLIC PANEL - Abnormal; Notable for the following components:   ALT 11 (*)    Total Bilirubin 0.2 (*)    All other components within normal limits  URINALYSIS, COMPLETE (UACMP) WITH MICROSCOPIC - Abnormal; Notable for the following components:   Color, Urine YELLOW (*)    APPearance CLEAR (*)    Hgb urine dipstick MODERATE (*)    Squamous Epithelial / LPF 6-30 (*)    All other components within normal limits  TROPONIN I  LIPASE, BLOOD  POCT PREGNANCY, URINE  POC URINE PREG, ED    ____________________________________________  EKG  ED ECG REPORT I, Loleta Roseory Neeraj Housand, the attending physician, personally viewed and interpreted this ECG.  Date: 11/09/2017 EKG Time: 23: 46 Rate: 97 Rhythm: normal sinus rhythm QRS Axis: normal Intervals: normal ST/T Wave abnormalities: Non-specific ST segment / T-wave changes including T wave inversion in lead III, but no evidence of acute ischemia. Narrative Interpretation: no evidence of acute ischemia   ____________________________________________  RADIOLOGY   Dg Chest 2 View  Result Date: 11/10/2017 CLINICAL DATA:  Shortness of breath and chest discomfort EXAM: CHEST  2 VIEW COMPARISON:  September 15, 2017 FINDINGS: Lungs are clear. Heart size and pulmonary vascularity are normal. No adenopathy. No pneumothorax. No bone lesions. IMPRESSION: No edema or consolidation. Electronically Signed   By: Bretta BangWilliam  Woodruff III M.D.   On: 11/10/2017 00:22    ____________________________________________   PROCEDURES  Critical Care performed: No   Procedure(s) performed:   Procedures   ____________________________________________   INITIAL IMPRESSION / ASSESSMENT AND PLAN / ED COURSE  As part of my medical decision making, I reviewed the following data within the electronic MEDICAL RECORD NUMBER Nursing notes reviewed and incorporated, Labs reviewed , EKG interpreted , Radiograph reviewed  and Notes from prior ED visits    Differential includes, but is not limited to, viral syndrome, bronchitis including COPD exacerbation, pneumonia, reactive airway disease including asthma, CHF including exacerbation with or without pulmonary/interstitial edema, pneumothorax, ACS, thoracic trauma, and pulmonary embolism.  However, as described above, I reviewed her medical record and she has had extensive workups in the past for shortness of breath, palpitations, chest pain, and abdominal pain, even including CT angio test, and the workup is  always reassuring.  I believe she is suffering from some anxiety and now feels better after some Benadryl and she is in no acute distress.  Her vital signs are within normal limits; she had some mild tachycardia after walking to triage, but that resolves after rest.  I had a discussion with her about her symptoms and provided reassurance and she is comfortable with the plan to go home and follow-up as an outpatient.  Clinical Course as of Nov 10 105  Fri Nov 10, 2017  [id number] Preg Test, Ur: NEGATIVE [CF]    Clinical Course User Index [CF] Loleta RoseForbach, Oshae Simmering, MD    ____________________________________________  FINAL CLINICAL IMPRESSION(S) / ED DIAGNOSES  Final diagnoses:  Normal physical  exam     MEDICATIONS GIVEN DURING THIS VISIT:  Medications - No data to display   ED Discharge Orders    None       Note:  This document was prepared using Dragon voice recognition software and may include unintentional dictation errors.    Loleta Rose, MD 11/10/17 [phone]

## 2017-11-17 ENCOUNTER — Ambulatory Visit: Payer: Self-pay | Admitting: Family Medicine

## 2017-11-20 ENCOUNTER — Emergency Department
Admission: EM | Admit: 2017-11-20 | Discharge: 2017-11-21 | Disposition: A | Discharge status: Home / Self Care | Payer: BLUE CROSS/BLUE SHIELD | Attending: Emergency Medicine | Admitting: Emergency Medicine

## 2017-11-20 ENCOUNTER — Encounter: Payer: Self-pay | Admitting: Emergency Medicine

## 2017-11-20 ENCOUNTER — Encounter: Payer: Self-pay | Admitting: Family Medicine

## 2017-11-20 ENCOUNTER — Ambulatory Visit (INDEPENDENT_AMBULATORY_CARE_PROVIDER_SITE_OTHER): Payer: BLUE CROSS/BLUE SHIELD | Admitting: Family Medicine

## 2017-11-20 VITALS — BP 117/70 | HR 107 | Temp 98.7°F | Resp 16 | Ht 67.0 in | Wt 262.3 lb

## 2017-11-20 DIAGNOSIS — J45909 Unspecified asthma, uncomplicated: Secondary | ICD-10-CM | POA: Insufficient documentation

## 2017-11-20 DIAGNOSIS — I951 Orthostatic hypotension: Secondary | ICD-10-CM | POA: Diagnosis not present

## 2017-11-20 DIAGNOSIS — H8112 Benign paroxysmal vertigo, left ear: Secondary | ICD-10-CM

## 2017-11-20 DIAGNOSIS — K219 Gastro-esophageal reflux disease without esophagitis: Secondary | ICD-10-CM | POA: Insufficient documentation

## 2017-11-20 DIAGNOSIS — Z79899 Other long term (current) drug therapy: Secondary | ICD-10-CM | POA: Diagnosis not present

## 2017-11-20 DIAGNOSIS — N644 Mastodynia: Secondary | ICD-10-CM

## 2017-11-20 DIAGNOSIS — R42 Dizziness and giddiness: Secondary | ICD-10-CM | POA: Diagnosis present

## 2017-11-20 LAB — COMPREHENSIVE METABOLIC PANEL
ALT: 10 U/L — ABNORMAL LOW (ref 14–54)
AST: 17 U/L (ref 15–41)
Albumin: 4.1 g/dL (ref 3.5–5.0)
Alkaline Phosphatase: 60 U/L (ref 38–126)
Anion gap: 9 (ref 5–15)
BUN: 16 mg/dL (ref 6–20)
CO2: 25 mmol/L (ref 22–32)
Calcium: 9.3 mg/dL (ref 8.9–10.3)
Chloride: 103 mmol/L (ref 101–111)
Creatinine, Ser: 0.73 mg/dL (ref 0.44–1.00)
GFR calc Af Amer: >60 mL/min (ref >60)
GFR calc non Af Amer: >60 mL/min (ref >60)
Glucose, Bld: 111 mg/dL — ABNORMAL HIGH (ref 65–99)
Potassium: 3.4 mmol/L — ABNORMAL LOW (ref 3.5–5.1)
Sodium: 137 mmol/L (ref 135–145)
Total Bilirubin: 0.4 mg/dL (ref 0.3–1.2)
Total Protein: 7.6 g/dL (ref 6.5–8.1)

## 2017-11-20 LAB — URINALYSIS, COMPLETE (UACMP) WITH MICROSCOPIC
Bilirubin Urine: NEGATIVE
Glucose, UA: NEGATIVE mg/dL
Ketones, ur: NEGATIVE mg/dL
Nitrite: NEGATIVE
Protein, ur: NEGATIVE mg/dL
Specific Gravity, Urine: 1.027 (ref 1.005–1.030)
pH: 5 (ref 5.0–8.0)

## 2017-11-20 LAB — CBC
HCT: 35.8 % (ref 35.0–47.0)
Hemoglobin: 11.6 g/dL — ABNORMAL LOW (ref 12.0–16.0)
MCH: 26.3 pg (ref 26.0–34.0)
MCHC: 32.5 g/dL (ref 32.0–36.0)
MCV: 81 fL (ref 80.0–100.0)
Platelets: 218 10*3/uL (ref 150–440)
RBC: 4.41 MIL/uL (ref 3.80–5.20)
RDW: 13.8 % (ref 11.5–14.5)
WBC: 5.9 10*3/uL (ref 3.6–11.0)

## 2017-11-20 LAB — POCT PREGNANCY, URINE: Preg Test, Ur: NEGATIVE

## 2017-11-20 LAB — LIPASE, BLOOD: Lipase: 27 U/L (ref 11–51)

## 2017-11-20 NOTE — ED Triage Notes (Signed)
Pt ambulatory to triage with steady gait, no distress noted. Pt c/o lower abdominal cramping x1 day as well as a HA. Pt seen at PCP and sent home with unknown diagnosis.

## 2017-11-20 NOTE — Progress Notes (Signed)
Subjective:    Patient ID: Jasmine Gordon, female    DOB: October 02, 1986, 31 y.o.   MRN: [id number]  Jasmine Gordon is a 31 y.o. female presenting on 11/20/2017 for Dizziness (as per patient has no appetite ate once yesterday not driniking enough water gets dizzy while driving yesterday and today and hx of iron deficiency ) and Breast Pain (left side onset week)   HPI   ED FOLLOW-UP VISIT  Hospital/Location: [facility] Date of ED Visit: 11/10/17  Reason for Presenting to ED: Dizziness, anxiety  FOLLOW-UP DIZZINESS / Heart Racing / Nausea / Vomiting x 1 - Constellation of Symptoms - ED provider note and record have been reviewed - Patient presents today about 10 days after recent ED visit. Brief summary of recent course, patient had constellation of above symptoms onset while driving few hours, describes "felt like whole mind went out" and she was "daydreaming" did not have any concern of LOC or pass out, but she just felt "not right" felt "motion sickness" pulled over and had nausea, and small amount of vomit x 1 then felt jittery, had some difficulty focusing vision,presented to ED (multiple ED visits in past few months), testing in ED with EKG, CBC with mild low Hgb 11 from prior 11.9 but similar within past 6 months, Troponin, Lipase, CMET, UA, urine pregnancy, CXR. Due to prior work-ups in past for dyspnea and chest pain abdominal pain, no further testing done, she was dx with anxiety, her symptoms had resolved upon arrival to ED. She was given some benadryl and discharged. - Today reports overall has done well after discharge from ED. Symptoms seem resolved. She has concerns about iron, reassured to hear result - She states just saw eye doctor, at [facility], has new glasses, thinks this may be related to her difficulty focusing while driving - Also admits intake of caffeine larger amount, may be part of it - She has been tired recently, son sick for a week, and she had poor sleep -  She also endorses possibly motion sickness when turns to Left at times - New medications on discharge: None - Changes to current meds on discharge: None - Denies chest pain, dyspnea, anxiety, nausea vomiting, abdominal pain, diarrhea, fever/chills, cough, headache, syncope  Additional complaints LEFT Breast Pain - Recent Menstrual cycle 12/16 to 12/22 - during and after she had Left breast pain, present for few days, then resolved, usually has breast heaviness, bilateral though, this was unilateral. No family history history of breast problem. Age 60 never had mammogram. Denies a lump or bump or mass or skin changes.  Depression screen [facility] 2/9 07/14/2017 06/17/2016  Decreased Interest 0 1  Down, Depressed, Hopeless 0 1  PHQ - 2 Score 0 2  Altered sleeping 0 2  Tired, decreased energy 0 3  Change in appetite 0 1  Feeling bad or failure about yourself  0 1  Trouble concentrating 0 1  Moving slowly or fidgety/restless 0 0  Suicidal thoughts 0 0  PHQ-9 Score 0 10  Difficult doing work/chores Not difficult at all -    Social History   Tobacco Use  . Smoking status: Never Smoker  . Smokeless tobacco: Never Used  Substance Use Topics  . Alcohol use: No  . Drug use: No    Review of Systems Per HPI unless specifically indicated above     Objective:    BP 117/70   Pulse (!) 107   Temp 98.7 F (37.1 C) (Oral)  Resp 16   Ht 5\' 7"  (1.702 m)   Wt 262 lb 4.8 oz (119 kg)   BMI 41.08 kg/m   Wt Readings from Last 3 Encounters:  11/20/17 262 lb 4.8 oz (119 kg)  11/09/17 261 lb (118.4 kg)  09/06/17 261 lb (118.4 kg)    Physical Exam  Constitutional: She is oriented to person, place, and time. She appears well-developed and well-nourished. No distress.  Well-appearing, comfortable, cooperative, obese  HENT:  Head: Normocephalic and atraumatic.  Mouth/Throat: Oropharynx is clear and moist.  Negative Dix-hallpike maneuver to provoke vertigo bilaterally. No nystagmus  Eyes:  Conjunctivae are normal. Right eye exhibits no discharge. Left eye exhibits no discharge.  Neck: Normal range of motion. Neck supple. No thyromegaly present.  Cardiovascular: Normal rate, regular rhythm, normal heart sounds and intact distal pulses.  No murmur heard. Pulmonary/Chest: Effort normal and breath sounds normal. No respiratory distress. She has no wheezes. She has no rales.  Declined breast exam today. She does not endorse active pain or mass. Localized pain from previous symptoms to lower mid 6 o clock region and radiation of pain back to 3 o clock  Musculoskeletal: Normal range of motion. She exhibits no edema.  Lymphadenopathy:    She has no cervical adenopathy.  Neurological: She is alert and oriented to person, place, and time. No cranial nerve deficit.  Distal sensation intact  Skin: Skin is warm and dry. No rash noted. She is not diaphoretic. No erythema.  Psychiatric: She has a normal mood and affect. Her behavior is normal.  Well groomed, good eye contact, normal speech and thoughts, does not appear anxious today  Nursing note and vitals reviewed.  Results for orders placed or performed during the hospital encounter of 11/10/17  CBC  Result Value Ref Range   WBC 5.1 3.6 - 11.0 K/uL   RBC 4.24 3.80 - 5.20 MIL/uL   Hemoglobin 11.0 (L) 12.0 - 16.0 g/dL   HCT 96.034.6 (L) 45.435.0 - 09.847.0 %   MCV 81.6 80.0 - 100.0 fL   MCH 25.8 (L) 26.0 - 34.0 pg   MCHC 31.7 (L) 32.0 - 36.0 g/dL   RDW 11.913.7 14.711.5 - 82.914.5 %   Platelets 219 150 - 440 K/uL  Troponin I  Result Value Ref Range   Troponin I <0.03 <0.03 ng/mL  Lipase, blood  Result Value Ref Range   Lipase 28 11 - 51 U/L  Comprehensive metabolic panel  Result Value Ref Range   Sodium 136 135 - 145 mmol/L   Potassium 3.5 3.5 - 5.1 mmol/L   Chloride 106 101 - 111 mmol/L   CO2 23 22 - 32 mmol/L   Glucose, Bld 97 65 - 99 mg/dL   BUN 14 6 - 20 mg/dL   Creatinine, Ser 5.620.67 0.44 - 1.00 mg/dL   Calcium 9.1 8.9 - 13.010.3 mg/dL   Total  Protein 7.3 6.5 - 8.1 g/dL   Albumin 4.1 3.5 - 5.0 g/dL   AST 17 15 - 41 U/L   ALT 11 (L) 14 - 54 U/L   Alkaline Phosphatase 51 38 - 126 U/L   Total Bilirubin 0.2 (L) 0.3 - 1.2 mg/dL   GFR calc non Af Amer >60 >60 mL/min   GFR calc Af Amer >60 >60 mL/min   Anion gap 7 5 - 15  Urinalysis, Complete w Microscopic  Result Value Ref Range   Color, Urine YELLOW (A) YELLOW   APPearance CLEAR (A) CLEAR   Specific Gravity, Urine 1.017  1.005 - 1.030   pH 6.0 5.0 - 8.0   Glucose, UA NEGATIVE NEGATIVE mg/dL   Hgb urine dipstick MODERATE (A) NEGATIVE   Bilirubin Urine NEGATIVE NEGATIVE   Ketones, ur NEGATIVE NEGATIVE mg/dL   Protein, ur NEGATIVE NEGATIVE mg/dL   Nitrite NEGATIVE NEGATIVE   Leukocytes, UA NEGATIVE NEGATIVE   RBC / HPF 0-5 0 - 5 RBC/hpf   WBC, UA 0-5 0 - 5 WBC/hpf   Bacteria, UA NONE SEEN NONE SEEN   Squamous Epithelial / LPF 6-30 (A) NONE SEEN   Mucus PRESENT   Pregnancy, urine POC  Result Value Ref Range   Preg Test, Ur NEGATIVE NEGATIVE      Assessment & Plan:   Problem List Items Addressed This Visit    None    Visit Diagnoses    Breast pain, left    -  Primary Presumed more benign etiology of pain, possibly MSK vs fibrocystic changes as it is menstrual cycle related breast pain, now unilateral not bilateral. No prior screening - Agree with initial work-up given prior history of breast complaints - Or[facility] MM Diagnostic and L [facility], our staff attempted to contact them today but unable to schedule due to holiday, will contact later this week, patient given contact info    Relevant Orders   US BREAST LTD UNI LEFT INC AXILLA   MM DIAG BREAST TOMO BILATERAL   Benign paroxysmal positional vertigo of left ear     Likely constellation of symptoms, may all be related to some BPPV vs motion sickness with new glasses and other factors such as caffeine, recent tired and poor sleep. - Negative Dix-Hallpike but history suggests possible L sided  BPPV - No other significant neurological findings or focal deficits  Plan: 1. Handout given with Epley maneuver TID for 1-2 weeks until resolved 2. May try OTC meclizine PRN for breakthrough symptoms 3. Return criteria, if not improved consider vestibular PT referral - discussed that if this has resolved and this is not case, then ultimately as discussed in ED her symptoms may be related to caffeine among other life stressors contributing to some anxiety. Suspect a multifactorial issue.       No orders of the defined types were placed in this encounter.   Follow up plan: Return in about 2 weeks (around 12/04/2017), or if symptoms worsen or fail to improve, for vertigo.  Saralyn PilarAlexander Aairah Negrette, DO [facility] 11/20/2017, 6:45 PM

## 2017-11-20 NOTE — Patient Instructions (Addendum)
Thank you for coming to the office today.   1.  Diagnostic Mammogram given your Left sided breast complaints and Ultrasound ordered  Most likely related to menstrual breast changes  If you dont hear back with an appointment, then call us or them  [facility] [address] [place], [place] [address] Phone: [phone]  Hemoglobin 11.0, previously 11.8 to 11.9 before  2.  You have symptoms of Vertigo (Benign Paroxysmal Positional Vertigo) - This is commonly caused by inner ear fluid imbalance, sometimes can be worsened by allergies and sinus symptoms, otherwise it can occur randomly sometimes and we may never discover the exact cause. - To treat this, try the Epley Manuever (see diagrams/instructions below) at home up to 3 times a day for 1-2 weeks or until symptoms resolve - You may take Meclizine as needed up to 3 times a day for dizziness, this will not cure symptoms but may help. Caution may make you drowsy.  If you develop significant worsening episode with vertigo that does not improve and you get severe headache, loss of vision, arm or leg weakness, slurred speech, or other concerning symptoms please seek immediate medical attention at Emergency Department.  Please schedule a follow-up appointment with Dr Althea CharonKaramalegos within 4 weeks if Vertigo not improving, and will consider Referral to Vestibular Rehab  See the next page for images describing the Epley Manuever.     ----------------------------------------------------------------------------------------------------------------------         Please schedule a Follow-up Appointment to: Return in about 2 weeks (around 12/04/2017), or if symptoms worsen or fail to improve, for vertigo.  If you have any other questions or concerns, please feel free to call the office or send a message through MyChart. You may also schedule an earlier appointment if  necessary.  Additionally, you may be receiving a survey about your experience at our office within a few days to 1 week by e-mail or mail. We value your feedback.  Jasmine PilarAlexander Bekki Tavenner, DO [facility]

## 2017-11-21 MED ORDER — PANTOPRAZOLE SODIUM 40 MG PO TBEC
40.0000 mg | DELAYED_RELEASE_TABLET | Freq: Every day | ORAL | Status: DC
  Administered 2017-11-21: 40 mg via ORAL

## 2017-11-21 MED ORDER — OMEPRAZOLE 20 MG PO CPDR: 20.0000 mg | DELAYED_RELEASE_CAPSULE | Freq: Every day | ORAL | 0 refills | Status: DC

## 2017-11-21 MED ORDER — PANTOPRAZOLE SODIUM 40 MG PO TBEC
DELAYED_RELEASE_TABLET | ORAL | Status: AC
  Filled 2017-11-21: qty 1

## 2017-11-21 NOTE — ED Provider Notes (Signed)
[facility] Emergency Department Provider Note __   First MD Initiated Contact with Patient 11/20/17 2337     (approximate)  I have reviewed the triage vital signs and the nursing notes.   HISTORY  Chief Complaint Abdominal Pain and Emesis    HPI Jasmine Gordon is a 32 y.o. female with below list of chronic medical conditions presents to the emergency department with episode of dizziness yesterday while driving to [place].  Patient states that she was having over.  With heels when she abruptly became dizzy.  Patient denies any headache nausea or vomiting at the time.  Patient states that symptoms resolved after she stopped the car.  Patient denies any chest pain during the incidents or shortness of breath.  In addition patient admits to epigastric abdominal discomfort after eating pizza with a burning sensation in the middle of the chest.  Past Medical History:  Diagnosis Date  . Allergy   . Anemia   . Anxiety   . Asthma   . Bronchiolitis   . Depression   . Migraine   . Panic attack   . PTSD (post-traumatic stress disorder) 2016    Patient Active Problem List   Diagnosis Date Noted  . Positive TB test 09/25/2017  . Allergic contact dermatitis due to metals 05/17/2017  . Pre-diabetes 03/06/2017  . Hyperlipidemia 03/06/2017  . Allergic reaction 02/08/2017  . Urticaria due to food allergy 02/08/2017  . Vitamin D deficiency 11/08/2016  . Iron deficiency anemia due to chronic blood loss 10/03/2016  . Menorrhagia with regular cycle 06/27/2016  . Mild persistent asthma 06/17/2016  . Anxiety and depression 06/17/2016  . Morbid obesity with BMI of 40.0-44.9, adult (HCC) 06/17/2016    Past Surgical History:  Procedure Laterality Date  . APPENDECTOMY    . Plantar warts      Prior to Admission medications   Medication Sig Start Date End Date Taking? Authorizing Provider  albuterol (PROVENTIL HFA;VENTOLIN HFA) 108 (90 Base) MCG/ACT  inhaler Inhale 2 puffs into the lungs every 6 (six) hours as needed for wheezing or shortness of breath. 06/17/16   Krebs, Laurel Dimmer, NP  albuterol (PROVENTIL) (2.5 MG/3ML) 0.083% nebulizer solution Take 3 mLs (2.5 mg total) by nebulization every 6 (six) hours as needed for wheezing or shortness of breath. 11/11/16   Karamalegos, Netta Neat, DO  azelastine (OPTIVAR) 0.05 % ophthalmic solution  02/28/17   [provider]  diazepam (VALIUM) 2 MG tablet Take 1 tablet (2 mg total) by mouth every 8 (eight) hours as needed for anxiety. 08/01/17 08/01/18  Emily Filbert, MD  EPINEPHrine 0.3 mg/0.3 mL IJ SOAJ injection INJECT INTRAMUSCULARLY AS DIRECTED 01/29/17   [provider]  fluticasone (FLONASE) 50 MCG/ACT nasal spray Place 1 spray into both nostrils daily. 03/28/17 03/28/18  Rebecka Apley, MD  loratadine (CLARITIN) 10 MG tablet Take 1 tablet (10 mg total) by mouth daily. 03/28/17 03/28/18  Rebecka Apley, MD  LORazepam (ATIVAN) 0.5 MG tablet Take 1 tablet (0.5 mg total) by mouth every 8 (eight) hours as needed for anxiety. 08/01/17 08/01/18  Emily Filbert, MD  magic mouthwash w/lidocaine SOLN Take 5 mLs by mouth 4 (four) times daily. 06/10/17   Joni Reining, PA-C  methylPREDNISolone (MEDROL DOSEPAK) 4 MG TBPK tablet Take Tapered dose as directed on Sunday morning. 06/10/17   Joni Reining, PA-C  omeprazole (PRILOSEC) 40 MG capsule Take 1 capsule (40 mg total) by mouth daily. 09/06/17 09/06/18  Rockne MenghiniNorman, Anne-Caroline, MD  ondansetron (ZOFRAN ODT) 4 MG disintegrating tablet Take 1 tablet (4 mg total) by mouth every 8 (eight) hours as needed for nausea or vomiting. 09/06/17   Rockne MenghiniNorman, Anne-Caroline, MD  ranitidine (ZANTAC) 150 MG tablet Take 1 tablet (150 mg total) by mouth 2 (two) times daily. 02/11/17 02/11/18  Enid DerryWagner, Ashley, PA-C  simethicone (GAS-X) 80 MG chewable tablet Chew 1 tablet (80 mg total) by mouth 4 (four) times daily as needed for flatulence. 09/06/17 09/06/18   Rockne MenghiniNorman, Anne-Caroline, MD    Allergies Reglan [metoclopramide] and Turmeric  Family History  Problem Relation Age of Onset  . Depression Mother   . Mental retardation Mother   . Bipolar disorder Mother   . Diabetes Father   . Stroke Maternal Grandfather   . Diabetes Maternal Grandfather     Social History Social History   Tobacco Use  . Smoking status: Never Smoker  . Smokeless tobacco: Never Used  Substance Use Topics  . Alcohol use: No  . Drug use: No    Review of Systems Constitutional: No fever/chills Eyes: No visual changes. ENT: No sore throat. Cardiovascular: Denies chest pain. Respiratory: Denies shortness of breath. Gastrointestinal: No abdominal pain.  No nausea, no vomiting.  No diarrhea.  No constipation. Genitourinary: Negative for dysuria. Musculoskeletal: Negative for neck pain.  Negative for back pain. Integumentary: Negative for rash. Neurological: Negative for headaches, focal weakness or numbness.   ____________________________________________   PHYSICAL EXAM:  VITAL SIGNS: ED Triage Vitals [11/20/17 2146]  Enc Vitals Group     BP (!) 142/97     Pulse Rate 99     Resp 16     Temp 98.2 F (36.8 C)     Temp Source Oral     SpO2 100 %     Weight 118.8 kg (262 lb)     Height      Head Circumference      Peak Flow      Pain Score      Pain Loc      Pain Edu?      Excl. in GC?     Constitutional: Alert and oriented. Well appearing and in no acute distress. Eyes: Conjunctivae are normal.  Head: Atraumatic. Mouth/Throat: Mucous membranes are moist.  Oropharynx non-erythematous. Neck: No stridor.   Cardiovascular: Normal rate, regular rhythm. Good peripheral circulation. Grossly normal heart sounds. Respiratory: Normal respiratory effort.  No retractions. Lungs CTAB. Gastrointestinal: Soft and nontender. No distention.  Musculoskeletal: No lower extremity tenderness nor edema. No gross deformities of extremities. Neurologic:  Normal  speech and language. No gross focal neurologic deficits are appreciated.  Skin:  Skin is warm, dry and intact. No rash noted. Psychiatric: Mood and affect are normal. Speech and behavior are normal.  ____________________________________________   LABS (all labs ordered are listed, but only abnormal results are displayed)  Labs Reviewed  COMPREHENSIVE METABOLIC PANEL - Abnormal; Notable for the following components:      Result Value   Potassium 3.4 (*)    Glucose, Bld 111 (*)    ALT 10 (*)    All other components within normal limits  CBC - Abnormal; Notable for the following components:   Hemoglobin 11.6 (*)    All other components within normal limits  URINALYSIS, COMPLETE (UACMP) WITH MICROSCOPIC - Abnormal; Notable for the following components:   Color, Urine YELLOW (*)    APPearance HAZY (*)    Hgb urine dipstick SMALL (*)    Leukocytes, UA  TRACE (*)    Bacteria, UA RARE (*)    Squamous Epithelial / LPF 6-30 (*)    All other components within normal limits  LIPASE, BLOOD  POC URINE PREG, ED  POCT PREGNANCY, URINE     Procedures ED ECG REPORT I, [place] N Quinterrius Errington, the attending physician, personally viewed and interpreted this ECG.   Date: 11/21/2017  EKG Time: 12:32 AM  Rate: 74  Rhythm: Normal sinus rhythm  Axis: Normal  Intervals: Normal  ST&T Change: None   ____________________________________________   INITIAL IMPRESSION / ASSESSMENT AND PLAN / ED COURSE  As part of my medical decision making, I reviewed the following data within the electronic MEDICAL RECORD NUMBER52 year old female presents to the emergency department with history and physical exam consistent with GERD.  Regarding patient's dizzy episode while driving to [place] suspect orthostatic hypotension as the etiology and as such orthostatic vital signs were performed in the emergency department which confirmed the suspicion.  Patient admits to very poor p.o. intake for the past week.  Patient  states dizziness is resolved at this time likewise burning sensation in her chest. ____________________________________________  FINAL CLINICAL IMPRESSION(S) / ED DIAGNOSES  Final diagnoses:  Orthostatic hypotension  Gastroesophageal reflux disease, esophagitis presence not specified     MEDICATIONS GIVEN DURING THIS VISIT:  Medications - No data to display   ED Discharge Orders    None       Note:  This document was prepared using Dragon voice recognition software and may include unintentional dictation errors.    Darci Current, MD 11/21/17 [phone]

## 2017-11-28 ENCOUNTER — Other Ambulatory Visit: Payer: BLUE CROSS/BLUE SHIELD

## 2017-12-13 ENCOUNTER — Other Ambulatory Visit: Payer: BLUE CROSS/BLUE SHIELD

## 2018-01-04 ENCOUNTER — Ambulatory Visit: Payer: BLUE CROSS/BLUE SHIELD | Admitting: Nurse Practitioner

## 2018-01-04 ENCOUNTER — Emergency Department
Admission: EM | Admit: 2018-01-04 | Discharge: 2018-01-04 | Disposition: A | Discharge status: Home / Self Care | Payer: BLUE CROSS/BLUE SHIELD | Attending: Emergency Medicine | Admitting: Emergency Medicine

## 2018-01-04 ENCOUNTER — Other Ambulatory Visit: Payer: Self-pay

## 2018-01-04 ENCOUNTER — Encounter: Payer: Self-pay | Admitting: Emergency Medicine

## 2018-01-04 DIAGNOSIS — J45909 Unspecified asthma, uncomplicated: Secondary | ICD-10-CM | POA: Insufficient documentation

## 2018-01-04 DIAGNOSIS — R6889 Other general symptoms and signs: Secondary | ICD-10-CM | POA: Insufficient documentation

## 2018-01-04 DIAGNOSIS — Z79899 Other long term (current) drug therapy: Secondary | ICD-10-CM | POA: Insufficient documentation

## 2018-01-04 DIAGNOSIS — M791 Myalgia, unspecified site: Secondary | ICD-10-CM | POA: Diagnosis present

## 2018-01-04 LAB — INFLUENZA PANEL BY PCR (TYPE A & B)
Influenza A By PCR: NEGATIVE
Influenza B By PCR: NEGATIVE

## 2018-01-04 MED ORDER — OSELTAMIVIR PHOSPHATE 75 MG PO CAPS: 75.0000 mg | ORAL_CAPSULE | Freq: Every day | ORAL | 0 refills | Status: DC

## 2018-01-04 NOTE — ED Triage Notes (Signed)
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

## 2018-01-04 NOTE — ED Provider Notes (Signed)
[facility] Emergency Department Provider Note  ____________________________________________   None    (approximate)  I have reviewed the triage vital signs and the nursing notes.   HISTORY  Chief Complaint Generalized Body Aches and Chills    HPI Jasmine Gordon is a 32 y.o. female complains of nausea, body aches since last night.  She had diarrhea a few days ago.  None today.  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  She did not get a flu vaccine this year.  She denies cough or congestion.  She states she just feels achy.  Past Medical History:  Diagnosis Date  . Allergy   . Anemia   . Anxiety   . Asthma   . Bronchiolitis   . Depression   . Migraine   . Panic attack   . PTSD (post-traumatic stress disorder) 2016    Patient Active Problem List   Diagnosis Date Noted  . Positive TB test 09/25/2017  . Allergic contact dermatitis due to metals 05/17/2017  . Pre-diabetes 03/06/2017  . Hyperlipidemia 03/06/2017  . Allergic reaction 02/08/2017  . Urticaria due to food allergy 02/08/2017  . Vitamin D deficiency 11/08/2016  . Iron deficiency anemia due to chronic blood loss 10/03/2016  . Menorrhagia with regular cycle 06/27/2016  . Mild persistent asthma 06/17/2016  . Anxiety and depression 06/17/2016  . Morbid obesity with BMI of 40.0-44.9, adult (HCC) 06/17/2016    Past Surgical History:  Procedure Laterality Date  . APPENDECTOMY    . Plantar warts      Prior to Admission medications   Medication Sig Start Date End Date Taking? Authorizing Provider  albuterol (PROVENTIL HFA;VENTOLIN HFA) 108 (90 Base) MCG/ACT inhaler Inhale 2 puffs into the lungs every 6 (six) hours as needed for wheezing or shortness of breath. 06/17/16   Krebs, Laurel Dimmer, NP  albuterol (PROVENTIL) (2.5 MG/3ML) 0.083% nebulizer solution Take 3 mLs (2.5 mg total) by nebulization every 6 (six) hours as needed for wheezing or  shortness of breath. 11/11/16   Karamalegos, Netta Neat, DO  azelastine (OPTIVAR) 0.05 % ophthalmic solution  02/28/17   [provider]  diazepam (VALIUM) 2 MG tablet Take 1 tablet (2 mg total) by mouth every 8 (eight) hours as needed for anxiety. 08/01/17 08/01/18  Emily Filbert, MD  EPINEPHrine 0.3 mg/0.3 mL IJ SOAJ injection INJECT INTRAMUSCULARLY AS DIRECTED 01/29/17   [provider]  fluticasone (FLONASE) 50 MCG/ACT nasal spray Place 1 spray into both nostrils daily. 03/28/17 03/28/18  Rebecka Apley, MD  loratadine (CLARITIN) 10 MG tablet Take 1 tablet (10 mg total) by mouth daily. 03/28/17 03/28/18  Rebecka Apley, MD  LORazepam (ATIVAN) 0.5 MG tablet Take 1 tablet (0.5 mg total) by mouth every 8 (eight) hours as needed for anxiety. 08/01/17 08/01/18  Emily Filbert, MD  magic mouthwash w/lidocaine SOLN Take 5 mLs by mouth 4 (four) times daily. 06/10/17   Joni Reining, PA-C  methylPREDNISolone (MEDROL DOSEPAK) 4 MG TBPK tablet Take Tapered dose as directed on Sunday morning. 06/10/17   Joni Reining, PA-C  omeprazole (PRILOSEC) 20 MG capsule Take 1 capsule (20 mg total) by mouth daily for 30 doses. 11/21/17 12/21/17  Darci Current, MD  omeprazole (PRILOSEC) 40 MG capsule Take 1 capsule (40 mg total) by mouth daily. 09/06/17 09/06/18  Rockne Menghini, MD  ondansetron (ZOFRAN ODT) 4 MG disintegrating tablet Take 1 tablet (4 mg  total) by mouth every 8 (eight) hours as needed for nausea or vomiting. 09/06/17   Rockne Menghini, MD  oseltamivir (TAMIFLU) 75 MG capsule Take 1 capsule (75 mg total) by mouth daily. 01/04/18   Fisher, Roselyn Bering, PA-C  ranitidine (ZANTAC) 150 MG tablet Take 1 tablet (150 mg total) by mouth 2 (two) times daily. 02/11/17 02/11/18  Enid Derry, PA-C  simethicone (GAS-X) 80 MG chewable tablet Chew 1 tablet (80 mg total) by mouth 4 (four) times daily as needed for flatulence. 09/06/17 09/06/18  Rockne Menghini, MD  famotidine  (PEPCID) 40 MG tablet Take 1 tablet (40 mg total) by mouth every evening. Patient not taking: Reported on 12/28/2015 10/07/15 02/12/16  Rebecka Apley, MD    Allergies Reglan [metoclopramide] and Turmeric  Family History  Problem Relation Age of Onset  . Depression Mother   . Mental retardation Mother   . Bipolar disorder Mother   . Diabetes Father   . Stroke Maternal Grandfather   . Diabetes Maternal Grandfather     Social History Social History   Tobacco Use  . Smoking status: Never Smoker  . Smokeless tobacco: Never Used  Substance Use Topics  . Alcohol use: No  . Drug use: No    Review of Systems  Constitutional: No fever/chills Eyes: No visual changes. ENT: No sore throat. Respiratory: Denies cough Gastrointestinal: Positive for nausea, had diarrhea a few days ago, none today Genitourinary: Negative for dysuria. Musculoskeletal: Negative for back pain. Skin: Negative for rash.    ____________________________________________   PHYSICAL EXAM:  VITAL SIGNS: ED Triage Vitals  Enc Vitals Group     BP 01/04/18 1329 133/85     Pulse Rate 01/04/18 1329 86     Resp 01/04/18 1329 18     Temp 01/04/18 1329 98.5 F (36.9 C)     Temp Source 01/04/18 1329 Oral     SpO2 01/04/18 1329 100 %     Weight 01/04/18 1329 259 lb (117.5 kg)     Height 01/04/18 1329 5\' 7"  (1.702 m)     Head Circumference --      Peak Flow --      Pain Score 01/04/18 1344 1     Pain Loc --      Pain Edu? --      Excl. in GC? --     Constitutional: Alert and oriented. Well appearing and in no acute distress. Eyes: Conjunctivae are normal.  Head: Atraumatic. Ears: TMs are clear bilaterally Nose: No congestion/rhinnorhea.  Nasal mucosa is within normal limits Mouth/Throat: Mucous membranes are moist.  Throat is normal Cardiovascular: Normal rate, regular rhythm.  Heart sounds are normal Respiratory: Normal respiratory effort.  No retractions, lungs are clear to auscultation Abdomen:  Is soft nontender bowel sounds are normal GU: deferred Musculoskeletal: FROM all extremities, warm and well perfused Neurologic:  Normal speech and language.  Skin:  Skin is warm, dry and intact. No rash noted. Psychiatric: Mood and affect are normal. Speech and behavior are normal.  ____________________________________________   LABS (all labs ordered are listed, but only abnormal results are displayed)  Labs Reviewed  INFLUENZA PANEL BY PCR (TYPE A & B)   ____________________________________________   ____________________________________________  RADIOLOGY    ____________________________________________   PROCEDURES  Procedure(s) performed: No  Procedures    ____________________________________________   INITIAL IMPRESSION / ASSESSMENT AND PLAN / ED COURSE  Pertinent labs & imaging results that were available during my care of the patient were reviewed by me  and considered in my medical decision making (see chart for details).  Patient is 32 year old female complaining of nausea and body aches since last night.  She denies any cold symptoms.  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  Physical exam patient appears very well.  There are no abnormal findings on exam.  Will await the flu test that was ordered by nursing staff.    ----------------------------------------- 4:15 PM on 01/04/2018 -----------------------------------------  Flu test is negative.  Test results were discussed with patient.  Explained to the patient that she does not currently have influenza.  Patient admitted that she feels like she is going to get the flu because all the children at the daycare have it.  She has great concerns of this.  Explained to her that Tamiflu may or may not work as a Adult nursepreventive.  She states she would still like to have a prescription.  Prescription for Tamiflu 75 mg 1 a day was prescribed for the patient.  If she is worsening she is to  see her regular doctor or return to the emergency department.  She was discharged in stable condition  As part of my medical decision making, I reviewed the following data within the electronic MEDICAL RECORD NUMBER Nursing notes reviewed and incorporated, Labs reviewed flu test is negative, Notes from prior ED visits and [place] Controlled Substance Database  ____________________________________________   FINAL CLINICAL IMPRESSION(S) / ED DIAGNOSES  Final diagnoses:  Flu-like symptoms      NEW MEDICATIONS STARTED DURING THIS VISIT:  New Prescriptions   OSELTAMIVIR (TAMIFLU) 75 MG CAPSULE    Take 1 capsule (75 mg total) by mouth daily.     Note:  This document was prepared using Dragon voice recognition software and may include unintentional dictation errors.    Faythe GheeFisher, Susan W, PA-C 01/04/18 1620    Nita SickleVeronese, [place], MD 01/08/18 [phone]

## 2018-01-04 NOTE — ED Triage Notes (Signed)
Pt to ED c/o chills, nausea, generalized body aches last night and getting worse today.  Denies cough, SOB.  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  Has denied flu shot this year.

## 2018-01-15 ENCOUNTER — Encounter: Payer: Self-pay | Admitting: Obstetrics and Gynecology

## 2018-01-15 ENCOUNTER — Ambulatory Visit: Payer: Self-pay | Admitting: Obstetrics and Gynecology

## 2018-03-04 ENCOUNTER — Emergency Department
Admission: EM | Admit: 2018-03-04 | Discharge: 2018-03-04 | Disposition: A | Discharge status: Home / Self Care | Payer: BLUE CROSS/BLUE SHIELD | Attending: Emergency Medicine | Admitting: Emergency Medicine

## 2018-03-04 ENCOUNTER — Encounter: Payer: Self-pay | Admitting: Emergency Medicine

## 2018-03-04 ENCOUNTER — Emergency Department: Payer: BLUE CROSS/BLUE SHIELD

## 2018-03-04 ENCOUNTER — Other Ambulatory Visit: Payer: Self-pay

## 2018-03-04 DIAGNOSIS — E785 Hyperlipidemia, unspecified: Secondary | ICD-10-CM | POA: Insufficient documentation

## 2018-03-04 DIAGNOSIS — R0789 Other chest pain: Secondary | ICD-10-CM | POA: Diagnosis not present

## 2018-03-04 DIAGNOSIS — J309 Allergic rhinitis, unspecified: Secondary | ICD-10-CM | POA: Diagnosis not present

## 2018-03-04 DIAGNOSIS — J302 Other seasonal allergic rhinitis: Secondary | ICD-10-CM

## 2018-03-04 DIAGNOSIS — R079 Chest pain, unspecified: Secondary | ICD-10-CM | POA: Diagnosis present

## 2018-03-04 DIAGNOSIS — Z79899 Other long term (current) drug therapy: Secondary | ICD-10-CM | POA: Insufficient documentation

## 2018-03-04 LAB — BASIC METABOLIC PANEL
Anion gap: 5 (ref 5–15)
BUN: 11 mg/dL (ref 6–20)
CO2: 24 mmol/L (ref 22–32)
Calcium: 8.9 mg/dL (ref 8.9–10.3)
Chloride: 109 mmol/L (ref 101–111)
Creatinine, Ser: 0.73 mg/dL (ref 0.44–1.00)
GFR calc Af Amer: >60 mL/min (ref >60)
GFR calc non Af Amer: >60 mL/min (ref >60)
Glucose, Bld: 91 mg/dL (ref 65–99)
Potassium: 3.7 mmol/L (ref 3.5–5.1)
Sodium: 138 mmol/L (ref 135–145)

## 2018-03-04 LAB — URINALYSIS, COMPLETE (UACMP) WITH MICROSCOPIC
Bacteria, UA: NONE SEEN
Bilirubin Urine: NEGATIVE
Glucose, UA: NEGATIVE mg/dL
Hgb urine dipstick: NEGATIVE
Ketones, ur: NEGATIVE mg/dL
Leukocytes, UA: NEGATIVE
Nitrite: NEGATIVE
Protein, ur: NEGATIVE mg/dL
Specific Gravity, Urine: 1.019 (ref 1.005–1.030)
pH: 5 (ref 5.0–8.0)

## 2018-03-04 LAB — CBC
HCT: 36.8 % (ref 35.0–47.0)
Hemoglobin: 12 g/dL (ref 12.0–16.0)
MCH: 26.4 pg (ref 26.0–34.0)
MCHC: 32.6 g/dL (ref 32.0–36.0)
MCV: 81.2 fL (ref 80.0–100.0)
Platelets: 214 10*3/uL (ref 150–440)
RBC: 4.53 MIL/uL (ref 3.80–5.20)
RDW: 14.4 % (ref 11.5–14.5)
WBC: 3.3 10*3/uL — ABNORMAL LOW (ref 3.6–11.0)

## 2018-03-04 LAB — POC URINE PREG, ED: Preg Test, Ur: NEGATIVE

## 2018-03-04 LAB — TROPONIN I: Troponin I: <0.03 ng/mL (ref <0.03)

## 2018-03-04 NOTE — ED Triage Notes (Addendum)
Pt c/o dull central chest pain.  Has not slept last 3 nights per pt.  Reports feels like she is dehydrated because not urinating as much. C/o headaches.  Reports has been taking electrolyte powder in water to help.  Ambulatory to triage.   Feels like heart racing.

## 2018-03-04 NOTE — ED Provider Notes (Signed)
[facility] Emergency Department Provider Note  ____________________________________________   First MD Initiated Contact with Patient 03/04/18 1216     (approximate)  I have reviewed the triage vital signs and the nursing notes.   HISTORY  Chief Complaint Chest Pain   HPI Jasmine Gordon is a 32 y.o. female who self presents the emergency department with roughly 3 days of chest pain.  The pain is dull in her center of her chest and aching.  Nonradiating.  Constant.  Seems to be worse in the morning and when lying flat.  She reports some palpitations.  The pain is nonexertional.  No shortness of breath.  No leg swelling.  No recent surgery travel or immobilization.  No history of DVT or pulmonary embolism.  Mild upper abdominal discomfort.  Past Medical History:  Diagnosis Date  . Allergy   . Anemia   . Anxiety   . Asthma   . Bronchiolitis   . Depression   . Irritability   . Migraine   . Obesity   . Panic attack   . PTSD (post-traumatic stress disorder) 2016    Patient Active Problem List   Diagnosis Date Noted  . Positive TB test 09/25/2017  . Allergic contact dermatitis due to metals 05/17/2017  . Pre-diabetes 03/06/2017  . Hyperlipidemia 03/06/2017  . Allergic reaction 02/08/2017  . Urticaria due to food allergy 02/08/2017  . Vitamin D deficiency 11/08/2016  . Iron deficiency anemia due to chronic blood loss 10/03/2016  . Menorrhagia with regular cycle 06/27/2016  . Mild persistent asthma 06/17/2016  . Anxiety and depression 06/17/2016  . Morbid obesity with BMI of 40.0-44.9, adult (HCC) 06/17/2016    Past Surgical History:  Procedure Laterality Date  . APPENDECTOMY    . Plantar warts      Prior to Admission medications   Medication Sig Start Date End Date Taking? Authorizing Provider  albuterol (PROVENTIL HFA;VENTOLIN HFA) 108 (90 Base) MCG/ACT inhaler Inhale 2 puffs into the lungs every 6 (six) hours as needed for wheezing  or shortness of breath. 06/17/16   Krebs, Laurel Dimmer, NP  albuterol (PROVENTIL) (2.5 MG/3ML) 0.083% nebulizer solution Take 3 mLs (2.5 mg total) by nebulization every 6 (six) hours as needed for wheezing or shortness of breath. 11/11/16   Karamalegos, Netta Neat, DO  azelastine (OPTIVAR) 0.05 % ophthalmic solution  02/28/17   [provider]  diazepam (VALIUM) 2 MG tablet Take 1 tablet (2 mg total) by mouth every 8 (eight) hours as needed for anxiety. 08/01/17 08/01/18  Emily Filbert, MD  EPINEPHrine 0.3 mg/0.3 mL IJ SOAJ injection INJECT INTRAMUSCULARLY AS DIRECTED 01/29/17   [provider]  fluticasone (FLONASE) 50 MCG/ACT nasal spray Place 1 spray into both nostrils daily. 03/28/17 03/28/18  Rebecka Apley, MD  loratadine (CLARITIN) 10 MG tablet Take 1 tablet (10 mg total) by mouth daily. 03/28/17 03/28/18  Rebecka Apley, MD  LORazepam (ATIVAN) 0.5 MG tablet Take 1 tablet (0.5 mg total) by mouth every 8 (eight) hours as needed for anxiety. 08/01/17 08/01/18  Emily Filbert, MD  magic mouthwash w/lidocaine SOLN Take 5 mLs by mouth 4 (four) times daily. 06/10/17   Joni Reining, PA-C  methylPREDNISolone (MEDROL DOSEPAK) 4 MG TBPK tablet Take Tapered dose as directed on Sunday morning. 06/10/17   Joni Reining, PA-C  omeprazole (PRILOSEC) 20 MG capsule Take 1 capsule (20 mg total) by mouth daily for 30 doses. 11/21/17 12/21/17  Darci Current, MD  omeprazole (PRILOSEC) 40 MG capsule Take 1 capsule (40 mg total) by mouth daily. 09/06/17 09/06/18  Rockne Menghini, MD  ondansetron (ZOFRAN ODT) 4 MG disintegrating tablet Take 1 tablet (4 mg total) by mouth every 8 (eight) hours as needed for nausea or vomiting. 09/06/17   Rockne Menghini, MD  oseltamivir (TAMIFLU) 75 MG capsule Take 1 capsule (75 mg total) by mouth daily. 01/04/18   Fisher, Roselyn Bering, PA-C  ranitidine (ZANTAC) 150 MG tablet Take 1 tablet (150 mg total) by mouth 2 (two) times daily. 02/11/17 02/11/18   Enid Derry, PA-C  simethicone (GAS-X) 80 MG chewable tablet Chew 1 tablet (80 mg total) by mouth 4 (four) times daily as needed for flatulence. 09/06/17 09/06/18  Rockne Menghini, MD    Allergies Reglan [metoclopramide]  Family History  Problem Relation Age of Onset  . Depression Mother   . Mental retardation Mother   . Bipolar disorder Mother   . Diabetes Father   . Stroke Maternal Grandfather   . Diabetes Maternal Grandfather     Social History Social History   Tobacco Use  . Smoking status: Never Smoker  . Smokeless tobacco: Never Used  Substance Use Topics  . Alcohol use: No  . Drug use: No    Review of Systems Constitutional: No fever/chills Eyes: No visual changes. ENT: No sore throat. Cardiovascular: Positive for chest pain. Respiratory: Denies shortness of breath. Gastrointestinal: Positive for abdominal pain.  No nausea, no vomiting.  No diarrhea.  No constipation. Genitourinary: Negative for dysuria. Musculoskeletal: Negative for back pain. Skin: Negative for rash. Neurological: Negative for headaches, focal weakness or numbness.   ____________________________________________   PHYSICAL EXAM:  VITAL SIGNS: ED Triage Vitals  Enc Vitals Group     BP 03/04/18 1101 117/74     Pulse Rate 03/04/18 1101 72     Resp 03/04/18 1101 18     Temp 03/04/18 1101 98.7 F (37.1 C)     Temp Source 03/04/18 1101 Oral     SpO2 03/04/18 1101 100 %     Weight 03/04/18 1058 251 lb (113.9 kg)     Height 03/04/18 1058 5\' 7"  (1.702 m)     Head Circumference --      Peak Flow --      Pain Score 03/04/18 1057 3     Pain Loc --      Pain Edu? --      Excl. in GC? --     Constitutional: Alert and oriented x4 obese nontoxic no diaphoresis speaks in full clear sentences Eyes: PERRL EOMI. Head: Atraumatic. Nose: No congestion/rhinnorhea. Mouth/Throat: No trismus Neck: No stridor.   Cardiovascular: Normal rate, regular rhythm. Grossly normal heart sounds.   Good peripheral circulation. Respiratory: Normal respiratory effort.  No retractions. Lungs CTAB and moving good air Gastrointestinal: Obese abdomen soft nondistended nontender no rebound or guarding or peritonitis Musculoskeletal: No lower extremity edema   Neurologic:  Normal speech and language. No gross focal neurologic deficits are appreciated. Skin:  Skin is warm, dry and intact. No rash noted. Psychiatric: Mood and affect are normal. Speech and behavior are normal.    ____________________________________________   DIFFERENTIAL includes but not limited to  Acute coronary syndrome, pulmonary embolism, gastritis, gastric reflux, esophageal spasm, biliary colic ____________________________________________   LABS (all labs ordered are listed, but only abnormal results are displayed)  Labs Reviewed  CBC - Abnormal; Notable for the following components:      Result Value   WBC 3.3 (*)  All other components within normal limits  URINALYSIS, COMPLETE (UACMP) WITH MICROSCOPIC - Abnormal; Notable for the following components:   Color, Urine YELLOW (*)    APPearance CLEAR (*)    Squamous Epithelial / LPF 0-5 (*)    All other components within normal limits  BASIC METABOLIC PANEL  TROPONIN I  POC URINE PREG, ED    Lab work reviewed by me with no acute disease __________________________________________  EKG  ED ECG REPORT I, Merrily BrittleNeil Jaycie Kregel, the attending physician, personally viewed and interpreted this ECG.  Date: 03/04/2018 EKG Time:  Rate: 63 Rhythm: normal sinus rhythm QRS Axis: normal Intervals: normal ST/T Wave abnormalities: normal Narrative Interpretation: no evidence of acute ischemia  ____________________________________________  RADIOLOGY  Chest x-ray reviewed by me with no acute disease ____________________________________________   PROCEDURES  Procedure(s) performed: no  Procedures  Critical Care performed: no  Observation:  no ____________________________________________   INITIAL IMPRESSION / ASSESSMENT AND PLAN / ED COURSE  Pertinent labs & imaging results that were available during my care of the patient were reviewed by me and considered in my medical decision making (see chart for details).       ----------------------------------------- 12:28 PM on 03/04/2018 -----------------------------------------  The patient is very well-appearing with unremarkable exam.  Her palpitations were likely secondary to previous dehydration.  She has follow-up with her allergist tomorrow.  I have encouraged her to restart taking an H1 blocker.  Strict return precautions have been given and the patient verbalizes understanding and agreement with the plan. ____________________________________________   FINAL CLINICAL IMPRESSION(S) / ED DIAGNOSES  Final diagnoses:  Atypical chest pain  Seasonal allergies      NEW MEDICATIONS STARTED DURING THIS VISIT:  Discharge Medication List as of 03/04/2018 12:28 PM       Note:  This document was prepared using Dragon voice recognition software and may include unintentional dictation errors.     Merrily Brittleifenbark, Keili Hasten, MD 03/07/18 2221

## 2018-03-04 NOTE — Discharge Instructions (Signed)
Please begin taking an over-the-counter allergy medication such as Zyrtec, Allegra, or Claritin every day.  Make an appointment to establish care with a new primary care physician as needed and return to the emergency department for any concerns whatsoever.  It was a pleasure to take care of you today, and thank you for coming to our emergency department.  If you have any questions or concerns before leaving please ask the nurse to grab me and I'm more than happy to go through your aftercare instructions again.  If you were prescribed any opioid pain medication today such as Norco, Vicodin, Percocet, morphine, hydrocodone, or oxycodone please make sure you do not drive when you are taking this medication as it can alter your ability to drive safely.  If you have any concerns once you are home that you are not improving or are in fact getting worse before you can make it to your follow-up appointment, please do not hesitate to call 911 and come back for further evaluation.  Merrily BrittleNeil Summer Mccolgan, MD  Results for orders placed or performed during the hospital encounter of 03/04/18  Basic metabolic panel  Result Value Ref Range   Sodium 138 135 - 145 mmol/L   Potassium 3.7 3.5 - 5.1 mmol/L   Chloride 109 101 - 111 mmol/L   CO2 24 22 - 32 mmol/L   Glucose, Bld 91 65 - 99 mg/dL   BUN 11 6 - 20 mg/dL   Creatinine, Ser 1.610.73 0.44 - 1.00 mg/dL   Calcium 8.9 8.9 - 09.610.3 mg/dL   GFR calc non Af Amer >60 >60 mL/min   GFR calc Af Amer >60 >60 mL/min   Anion gap 5 5 - 15  CBC  Result Value Ref Range   WBC 3.3 (L) 3.6 - 11.0 K/uL   RBC 4.53 3.80 - 5.20 MIL/uL   Hemoglobin 12.0 12.0 - 16.0 g/dL   HCT 04.536.8 40.935.0 - 81.147.0 %   MCV 81.2 80.0 - 100.0 fL   MCH 26.4 26.0 - 34.0 pg   MCHC 32.6 32.0 - 36.0 g/dL   RDW 91.414.4 78.211.5 - 95.614.5 %   Platelets 214 150 - 440 K/uL  Troponin I  Result Value Ref Range   Troponin I <0.03 <0.03 ng/mL  Urinalysis, Complete w Microscopic  Result Value Ref Range   Color, Urine YELLOW  (A) YELLOW   APPearance CLEAR (A) CLEAR   Specific Gravity, Urine 1.019 1.005 - 1.030   pH 5.0 5.0 - 8.0   Glucose, UA NEGATIVE NEGATIVE mg/dL   Hgb urine dipstick NEGATIVE NEGATIVE   Bilirubin Urine NEGATIVE NEGATIVE   Ketones, ur NEGATIVE NEGATIVE mg/dL   Protein, ur NEGATIVE NEGATIVE mg/dL   Nitrite NEGATIVE NEGATIVE   Leukocytes, UA NEGATIVE NEGATIVE   RBC / HPF 0-5 0 - 5 RBC/hpf   WBC, UA 0-5 0 - 5 WBC/hpf   Bacteria, UA NONE SEEN NONE SEEN   Squamous Epithelial / LPF 0-5 (A) NONE SEEN   Mucus PRESENT   POC urine preg, ED  Result Value Ref Range   Preg Test, Ur Negative Negative   Dg Chest 2 View  Result Date: 03/04/2018 CLINICAL DATA:  Palpitations and chest pain. EXAM: CHEST - 2 VIEW COMPARISON:  Chest x-ray dated November 10, 2017. FINDINGS: The heart size and mediastinal contours are within normal limits. Both lungs are clear. The visualized skeletal structures are unremarkable. IMPRESSION: Normal chest x-ray. Electronically Signed   By: Obie DredgeWilliam T Derry M.D.   On: 03/04/2018 11:29

## 2018-03-06 ENCOUNTER — Encounter: Payer: Self-pay | Admitting: Family Medicine

## 2018-04-02 ENCOUNTER — Other Ambulatory Visit: Payer: Self-pay

## 2018-04-02 ENCOUNTER — Encounter: Payer: Self-pay | Admitting: Emergency Medicine

## 2018-04-02 ENCOUNTER — Emergency Department
Admission: EM | Admit: 2018-04-02 | Discharge: 2018-04-02 | Disposition: A | Discharge status: Home / Self Care | Payer: BLUE CROSS/BLUE SHIELD | Attending: Emergency Medicine | Admitting: Emergency Medicine

## 2018-04-02 DIAGNOSIS — Z79899 Other long term (current) drug therapy: Secondary | ICD-10-CM | POA: Insufficient documentation

## 2018-04-02 DIAGNOSIS — E785 Hyperlipidemia, unspecified: Secondary | ICD-10-CM | POA: Insufficient documentation

## 2018-04-02 DIAGNOSIS — R079 Chest pain, unspecified: Secondary | ICD-10-CM | POA: Diagnosis present

## 2018-04-02 DIAGNOSIS — K219 Gastro-esophageal reflux disease without esophagitis: Secondary | ICD-10-CM | POA: Insufficient documentation

## 2018-04-02 DIAGNOSIS — J4530 Mild persistent asthma, uncomplicated: Secondary | ICD-10-CM | POA: Diagnosis not present

## 2018-04-02 LAB — CBC
HCT: 33.7 % — ABNORMAL LOW (ref 35.0–47.0)
Hemoglobin: 11.2 g/dL — ABNORMAL LOW (ref 12.0–16.0)
MCH: 26.8 pg (ref 26.0–34.0)
MCHC: 33.3 g/dL (ref 32.0–36.0)
MCV: 80.4 fL (ref 80.0–100.0)
Platelets: 231 10*3/uL (ref 150–440)
RBC: 4.19 MIL/uL (ref 3.80–5.20)
RDW: 14.2 % (ref 11.5–14.5)
WBC: 5.5 10*3/uL (ref 3.6–11.0)

## 2018-04-02 LAB — BASIC METABOLIC PANEL
Anion gap: 7 (ref 5–15)
BUN: 11 mg/dL (ref 6–20)
CO2: 25 mmol/L (ref 22–32)
Calcium: 9.2 mg/dL (ref 8.9–10.3)
Chloride: 104 mmol/L (ref 101–111)
Creatinine, Ser: 0.73 mg/dL (ref 0.44–1.00)
GFR calc Af Amer: >60 mL/min (ref >60)
GFR calc non Af Amer: >60 mL/min (ref >60)
Glucose, Bld: 95 mg/dL (ref 65–99)
Potassium: 3.3 mmol/L — ABNORMAL LOW (ref 3.5–5.1)
Sodium: 136 mmol/L (ref 135–145)

## 2018-04-02 LAB — TROPONIN I: Troponin I: <0.03 ng/mL (ref <0.03)

## 2018-04-02 LAB — POCT PREGNANCY, URINE: Preg Test, Ur: NEGATIVE

## 2018-04-02 MED ORDER — FAMOTIDINE 20 MG PO TABS
40.0000 mg | ORAL_TABLET | Freq: Once | ORAL | Status: AC
  Administered 2018-04-02: 40 mg via ORAL
  Filled 2018-04-02: qty 2

## 2018-04-02 MED ORDER — FAMOTIDINE 40 MG PO TABS: 40.0000 mg | ORAL_TABLET | Freq: Every evening | ORAL | 0 refills | Status: DC

## 2018-04-02 MED ORDER — ACETAMINOPHEN 325 MG PO TABS
650.0000 mg | ORAL_TABLET | Freq: Once | ORAL | Status: AC
  Administered 2018-04-02: 650 mg via ORAL
  Filled 2018-04-02: qty 2

## 2018-04-02 NOTE — ED Triage Notes (Signed)
Says she woke up during night and had burning chest pain upper mid chest and thought she smelled bile. Says she has had pain all day now

## 2018-04-02 NOTE — ED Provider Notes (Addendum)
[facility] Emergency Department Provider Note  ___________________________________________   First MD Initiated Contact with Patient 04/02/18 1909     (approximate)  I have reviewed the triage vital signs and the nursing notes.   HISTORY  Chief Complaint Chest Pain and Gastroesophageal Reflux   HPI Jasmine Gordon is a 32 y.o. female with a history of gastroesophageal reflux who is presenting to the emergency department today complaining of chest pain.  She says that the pain started during the night after she had eaten 3 pieces of fried chicken, 2 servings of macaroni and cheese, fruit punch, senna bond and a slice of cake.  She says that she woke up sensing the taste of vomit in her throat and her nose.  Says that she then had burning chest pain throughout the day intermittently which was also associated with some central aching chest pain.  Also shortness of breath but she says this is baseline.  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  She is requesting Tylenol for the headache.  Says the headache is similar to previous headaches.  Does not report any sudden onset of the headache.  Does not report any numbness or weakness.  Patient is chest pain-free at this time.  Denies any shortness of breath at this time.   Past Medical History:  Diagnosis Date  . Allergy   . Anemia   . Anxiety   . Asthma   . Bronchiolitis   . Depression   . Irritability   . Migraine   . Obesity   . Panic attack   . PTSD (post-traumatic stress disorder) 2016    Patient Active Problem List   Diagnosis Date Noted  . Positive TB test 09/25/2017  . Allergic contact dermatitis due to metals 05/17/2017  . Pre-diabetes 03/06/2017  . Hyperlipidemia 03/06/2017  . Allergic reaction 02/08/2017  . Urticaria due to food allergy 02/08/2017  . Vitamin D deficiency 11/08/2016  . Iron deficiency anemia due  to chronic blood loss 10/03/2016  . Menorrhagia with regular cycle 06/27/2016  . Mild persistent asthma 06/17/2016  . Anxiety and depression 06/17/2016  . Morbid obesity with BMI of 40.0-44.9, adult (HCC) 06/17/2016    Past Surgical History:  Procedure Laterality Date  . APPENDECTOMY    . Plantar warts      Prior to Admission medications   Medication Sig Start Date End Date Taking? Authorizing Provider  albuterol (PROVENTIL HFA;VENTOLIN HFA) 108 (90 Base) MCG/ACT inhaler Inhale 2 puffs into the lungs every 6 (six) hours as needed for wheezing or shortness of breath. 06/17/16   Krebs, Laurel Dimmer, NP  albuterol (PROVENTIL) (2.5 MG/3ML) 0.083% nebulizer solution Take 3 mLs (2.5 mg total) by nebulization every 6 (six) hours as needed for wheezing or shortness of breath. 11/11/16   Karamalegos, Netta Neat, DO  azelastine (OPTIVAR) 0.05 % ophthalmic solution  02/28/17   [provider]  diazepam (VALIUM) 2 MG tablet Take 1 tablet (2 mg total) by mouth every 8 (eight) hours as needed for anxiety. 08/01/17 08/01/18  Emily Filbert, MD  EPINEPHrine 0.3 mg/0.3 mL IJ SOAJ injection INJECT INTRAMUSCULARLY AS DIRECTED 01/29/17   [provider]  fluticasone (FLONASE) 50 MCG/ACT nasal spray Place 1 spray into both nostrils daily. 03/28/17 03/28/18  Rebecka Apley, MD  loratadine (CLARITIN) 10 MG tablet Take 1 tablet (10 mg total) by mouth daily. 03/28/17 03/28/18  Zenda Alpers,  Melchor Amour, MD  LORazepam (ATIVAN) 0.5 MG tablet Take 1 tablet (0.5 mg total) by mouth every 8 (eight) hours as needed for anxiety. 08/01/17 08/01/18  Emily Filbert, MD  magic mouthwash w/lidocaine SOLN Take 5 mLs by mouth 4 (four) times daily. 06/10/17   Joni Reining, PA-C  methylPREDNISolone (MEDROL DOSEPAK) 4 MG TBPK tablet Take Tapered dose as directed on Sunday morning. 06/10/17   Joni Reining, PA-C  omeprazole (PRILOSEC) 20 MG capsule Take 1 capsule (20 mg total) by mouth daily for 30 doses. 11/21/17 12/21/17   Darci Current, MD  omeprazole (PRILOSEC) 40 MG capsule Take 1 capsule (40 mg total) by mouth daily. 09/06/17 09/06/18  Rockne Menghini, MD  ondansetron (ZOFRAN ODT) 4 MG disintegrating tablet Take 1 tablet (4 mg total) by mouth every 8 (eight) hours as needed for nausea or vomiting. 09/06/17   Rockne Menghini, MD  oseltamivir (TAMIFLU) 75 MG capsule Take 1 capsule (75 mg total) by mouth daily. 01/04/18   Fisher, Roselyn Bering, PA-C  ranitidine (ZANTAC) 150 MG tablet Take 1 tablet (150 mg total) by mouth 2 (two) times daily. 02/11/17 02/11/18  Enid Derry, PA-C  simethicone (GAS-X) 80 MG chewable tablet Chew 1 tablet (80 mg total) by mouth 4 (four) times daily as needed for flatulence. 09/06/17 09/06/18  Rockne Menghini, MD    Allergies Reglan [metoclopramide]  Family History  Problem Relation Age of Onset  . Depression Mother   . Mental retardation Mother   . Bipolar disorder Mother   . Diabetes Father   . Stroke Maternal Grandfather   . Diabetes Maternal Grandfather     Social History Social History   Tobacco Use  . Smoking status: Never Smoker  . Smokeless tobacco: Never Used  Substance Use Topics  . Alcohol use: No  . Drug use: No    Review of Systems  Constitutional: No fever/chills Eyes: No visual changes. ENT: No sore throat. Cardiovascular: As above Respiratory: As above Gastrointestinal: No abdominal pain.  No nausea, no vomiting.  No diarrhea.  No constipation. Genitourinary: Negative for dysuria. Musculoskeletal: Negative for back pain. Skin: Negative for rash. Neurological: Negative for headaches, focal weakness or numbness.   ____________________________________________   PHYSICAL EXAM:  VITAL SIGNS: ED Triage Vitals [04/02/18 1754]  Enc Vitals Group     BP 115/83     Pulse Rate (!) 118     Resp 16     Temp 99.3 F (37.4 C)     Temp Source Oral     SpO2 100 %     Weight 251 lb (113.9 kg)     Height  (1.702 m)     Head  Circumference      Peak Flow      Pain Score 7     Pain Loc      Pain Edu?      Excl. in GC?     Constitutional: Alert and oriented. Well appearing and in no acute distress. Eyes: Conjunctivae are normal.  Head: Atraumatic. Nose: No congestion/rhinnorhea. Mouth/Throat: Mucous membranes are moist.  Neck: No stridor.   Cardiovascular: Normal rate, regular rhythm. Grossly normal heart sounds.  Chest pain is not reproducible to palpation.  Heart rate of 85 bpm in the room. Respiratory: Normal respiratory effort.  No retractions. Lungs CTAB. Gastrointestinal: Soft and nontender. No distention. Musculoskeletal: No lower extremity tenderness nor edema.  No joint effusions. Neurologic:  Normal speech and language. No gross focal neurologic deficits are appreciated.  Skin:  Skin is warm, dry and intact. No rash noted. Psychiatric: Mood and affect are normal. Speech and behavior are normal.  ____________________________________________   LABS (all labs ordered are listed, but only abnormal results are displayed)  Labs Reviewed  BASIC METABOLIC PANEL - Abnormal; Notable for the following components:      Result Value   Potassium 3.3 (*)    All other components within normal limits  CBC - Abnormal; Notable for the following components:   Hemoglobin 11.2 (*)    HCT 33.7 (*)    All other components within normal limits  TROPONIN I  POCT PREGNANCY, URINE  POC URINE PREG, ED   ____________________________________________  EKG  ED ECG REPORT I, Arelia Longest, the attending physician, personally viewed and interpreted this ECG.   Date: 04/02/2018  EKG Time: 1750  Rate: 116  Rhythm: sinus tachycardia  Axis: Normal  Intervals:none  ST&T Change: No ST segment elevation or depression.  Biphasic T waves in aVF as well as V4 through 6. Similar appearance to the EKG of 11/09/2017 with similar biphasic T waves with nonspecific T wave  abnormality. ____________________________________________  RADIOLOGY  No chest x-ray done today. ____________________________________________   PROCEDURES  Procedure(s) performed:   Procedures  Critical Care performed:   ____________________________________________   INITIAL IMPRESSION / ASSESSMENT AND PLAN / ED COURSE  Pertinent labs & imaging results that were available during my care of the patient were reviewed by me and considered in my medical decision making (see chart for details).  Differential diagnosis includes, but is not limited to, ACS, aortic dissection, pulmonary embolism, cardiac tamponade, pneumothorax, pneumonia, pericarditis, myocarditis, GI-related causes including esophagitis/gastritis, and musculoskeletal chest wall pain.   As part of my medical decision making, I reviewed the following data within the electronic MEDICAL RECORD NUMBER Notes from prior ED visits  Patient continues to be pain-free in the emergency department.  No chest pain ordered on initial protocols.  However, the patient has had 5 chest x-rays over the past 12 months including 1 done less than 1 month ago.  I do not think it is necessary to repeat an additional chest x-ray at this time.  Especially given that the patient is pain-free.  Likely reflux secondary to overeating.  Will be discharged with Pepcid.  Patient also is requesting Tylenol for the headache.  Initial troponin is negative after symptoms started multiple hours prior to arrival.  Do not suspect cardiac etiology.  Patient also denies any cardiac disease in her family.  Patient says that initially she had come directly from work and the doctor forgets and come to the hospital which is likely what she believes to explain her elevated heart rate.  Heart rate and the room when I examined her was 85 bpm.  She says that she has a non-hormone secreting IUD.  Very low risk for PE.  Also symptom-free at this  time. ____________________________________________   FINAL CLINICAL IMPRESSION(S) / ED DIAGNOSES  Chest pain.  GERD.    NEW MEDICATIONS STARTED DURING THIS VISIT:  New Prescriptions   No medications on file     Note:  This document was prepared using Dragon voice recognition software and may include unintentional dictation errors.     Myrna Blazer, MD 04/02/18 2043    Myrna Blazer, MD 04/02/18 2046

## 2018-04-16 ENCOUNTER — Other Ambulatory Visit: Payer: Self-pay

## 2018-04-16 ENCOUNTER — Emergency Department
Admission: EM | Admit: 2018-04-16 | Discharge: 2018-04-16 | Disposition: A | Discharge status: Home / Self Care | Payer: BLUE CROSS/BLUE SHIELD | Attending: Emergency Medicine | Admitting: Emergency Medicine

## 2018-04-16 ENCOUNTER — Encounter: Payer: Self-pay | Admitting: Emergency Medicine

## 2018-04-16 DIAGNOSIS — R0981 Nasal congestion: Secondary | ICD-10-CM | POA: Diagnosis present

## 2018-04-16 DIAGNOSIS — Z79899 Other long term (current) drug therapy: Secondary | ICD-10-CM | POA: Diagnosis not present

## 2018-04-16 DIAGNOSIS — F419 Anxiety disorder, unspecified: Secondary | ICD-10-CM | POA: Insufficient documentation

## 2018-04-16 DIAGNOSIS — J0100 Acute maxillary sinusitis, unspecified: Secondary | ICD-10-CM

## 2018-04-16 DIAGNOSIS — F329 Major depressive disorder, single episode, unspecified: Secondary | ICD-10-CM | POA: Diagnosis not present

## 2018-04-16 DIAGNOSIS — J45909 Unspecified asthma, uncomplicated: Secondary | ICD-10-CM | POA: Insufficient documentation

## 2018-04-16 MED ORDER — FLUTICASONE PROPIONATE 50 MCG/ACT NA SUSP: 1.0000 | Freq: Two times a day (BID) | NASAL | 0 refills | Status: DC

## 2018-04-16 MED ORDER — AMOXICILLIN-POT CLAVULANATE 875-125 MG PO TABS: 1.0000 | ORAL_TABLET | Freq: Two times a day (BID) | ORAL | 0 refills | Status: DC

## 2018-04-16 NOTE — ED Notes (Signed)
See triage note  Presents with sinus pressure and nasal congestion.  States nasal drainage was clear at first and then became greenish in color  Unsure of fever at home but has had chills afebrile on arrival..

## 2018-04-16 NOTE — ED Triage Notes (Signed)
Nasal congestion and headache x 1 week. Yellow nasal drainage.

## 2018-04-16 NOTE — ED Provider Notes (Signed)
[facility] Emergency Department Provider Note  ____________________________________________  Time seen: Approximately 6:20 PM  I have reviewed the triage vital signs and the nursing notes.   HISTORY  Chief Complaint Nasal Congestion    HPI Jasmine Gordon is a 32 y.o. female who presents the emergency department for 10 to 14-day history of increasing nasal congestion, sinus pressure.  Patient reports that symptoms began like allergic rhinitis but she did not treat with any medications.  Symptoms progressed to include significant sinus congestion, sinus pressure, sinus headache, sneezing, coughing.  Patient denies any fevers or chills, difficulty breathing or swallowing, chest pain, shortness of breath, abdominal pain, nausea or vomiting.  Patient has taken intermittent over-the-counter allergy medications without significant relief.  No other complaints at this time.  Past Medical History:  Diagnosis Date  . Allergy   . Anemia   . Anxiety   . Asthma   . Bronchiolitis   . Depression   . Irritability   . Migraine   . Obesity   . Panic attack   . PTSD (post-traumatic stress disorder) 2016    Patient Active Problem List   Diagnosis Date Noted  . Positive TB test 09/25/2017  . Allergic contact dermatitis due to metals 05/17/2017  . Pre-diabetes 03/06/2017  . Hyperlipidemia 03/06/2017  . Allergic reaction 02/08/2017  . Urticaria due to food allergy 02/08/2017  . Vitamin D deficiency 11/08/2016  . Iron deficiency anemia due to chronic blood loss 10/03/2016  . Menorrhagia with regular cycle 06/27/2016  . Mild persistent asthma 06/17/2016  . Anxiety and depression 06/17/2016  . Morbid obesity with BMI of 40.0-44.9, adult (HCC) 06/17/2016    Past Surgical History:  Procedure Laterality Date  . APPENDECTOMY    . Plantar warts      Prior to Admission medications   Medication Sig Start Date End Date Taking? Authorizing Provider  albuterol  (PROVENTIL HFA;VENTOLIN HFA) 108 (90 Base) MCG/ACT inhaler Inhale 2 puffs into the lungs every 6 (six) hours as needed for wheezing or shortness of breath. 06/17/16   Krebs, Laurel Dimmer, NP  albuterol (PROVENTIL) (2.5 MG/3ML) 0.083% nebulizer solution Take 3 mLs (2.5 mg total) by nebulization every 6 (six) hours as needed for wheezing or shortness of breath. 11/11/16   Karamalegos, Netta Neat, DO  amoxicillin-clavulanate (AUGMENTIN) 875-125 MG tablet Take 1 tablet by mouth 2 (two) times daily. 04/16/18   Meyer Arora, Delorise Royals, PA-C  azelastine (OPTIVAR) 0.05 % ophthalmic solution  02/28/17   [provider]  diazepam (VALIUM) 2 MG tablet Take 1 tablet (2 mg total) by mouth every 8 (eight) hours as needed for anxiety. 08/01/17 08/01/18  Emily Filbert, MD  EPINEPHrine 0.3 mg/0.3 mL IJ SOAJ injection INJECT INTRAMUSCULARLY AS DIRECTED 01/29/17   [provider]  famotidine (PEPCID) 40 MG tablet Take 1 tablet (40 mg total) by mouth every evening. 04/02/18 04/02/19  Myrna Blazer, MD  fluticasone (FLONASE) 50 MCG/ACT nasal spray Place 1 spray into both nostrils 2 (two) times daily. 04/16/18   Rynn Markiewicz, Delorise Royals, PA-C  loratadine (CLARITIN) 10 MG tablet Take 1 tablet (10 mg total) by mouth daily. 03/28/17 03/28/18  Rebecka Apley, MD  LORazepam (ATIVAN) 0.5 MG tablet Take 1 tablet (0.5 mg total) by mouth every 8 (eight) hours as needed for anxiety. 08/01/17 08/01/18  Emily Filbert, MD  magic mouthwash w/lidocaine SOLN Take 5 mLs by mouth 4 (four) times daily. 06/10/17   Joni Reining, PA-C  methylPREDNISolone (MEDROL DOSEPAK)  4 MG TBPK tablet Take Tapered dose as directed on Sunday morning. 06/10/17   Joni Reining, PA-C  omeprazole (PRILOSEC) 20 MG capsule Take 1 capsule (20 mg total) by mouth daily for 30 doses. 11/21/17 12/21/17  Darci Current, MD  omeprazole (PRILOSEC) 40 MG capsule Take 1 capsule (40 mg total) by mouth daily. 09/06/17 09/06/18  Rockne Menghini,  MD  ondansetron (ZOFRAN ODT) 4 MG disintegrating tablet Take 1 tablet (4 mg total) by mouth every 8 (eight) hours as needed for nausea or vomiting. 09/06/17   Rockne Menghini, MD  oseltamivir (TAMIFLU) 75 MG capsule Take 1 capsule (75 mg total) by mouth daily. 01/04/18   Fisher, Roselyn Bering, PA-C  ranitidine (ZANTAC) 150 MG tablet Take 1 tablet (150 mg total) by mouth 2 (two) times daily. 02/11/17 02/11/18  Enid Derry, PA-C  simethicone (GAS-X) 80 MG chewable tablet Chew 1 tablet (80 mg total) by mouth 4 (four) times daily as needed for flatulence. 09/06/17 09/06/18  Rockne Menghini, MD    Allergies Reglan [metoclopramide]  Family History  Problem Relation Age of Onset  . Depression Mother   . Mental retardation Mother   . Bipolar disorder Mother   . Diabetes Father   . Stroke Maternal Grandfather   . Diabetes Maternal Grandfather     Social History Social History   Tobacco Use  . Smoking status: Never Smoker  . Smokeless tobacco: Never Used  Substance Use Topics  . Alcohol use: No  . Drug use: No     Review of Systems  Constitutional: No fever/chills Eyes: No visual changes. No discharge ENT: Positive for nasal congestion, sinus pressure, sinus headaches Cardiovascular: no chest pain. Respiratory: no cough. No SOB. Gastrointestinal: No abdominal pain.  No nausea, no vomiting.   Musculoskeletal: Negative for musculoskeletal pain. Skin: Negative for rash, abrasions, lacerations, ecchymosis. Neurological: Negative for headaches, focal weakness or numbness. 10-point ROS otherwise negative.  ____________________________________________   PHYSICAL EXAM:  VITAL SIGNS: ED Triage Vitals  Enc Vitals Group     BP 04/16/18 1803 122/79     Pulse Rate 04/16/18 1803 100     Resp 04/16/18 1803 18     Temp 04/16/18 1803 98.7 F (37.1 C)     Temp Source 04/16/18 1803 Oral     SpO2 04/16/18 1803 99 %     Weight 04/16/18 1804 248 lb (112.5 kg)     Height 04/16/18  1804  (1.702 m)     Head Circumference --      Peak Flow --      Pain Score 04/16/18 1804 7     Pain Loc --      Pain Edu? --      Excl. in GC? --      Constitutional: Alert and oriented. Well appearing and in no acute distress. Eyes: Conjunctivae are normal. PERRL. EOMI. Head: Atraumatic. ENT:      Ears: EACs unremarkable bilaterally.  TMs are mildly bulging bilaterally.      Nose: Moderate congestion/rhinnorhea.  Minutes are boggy.  Patient is tender to percussion over maxillary sinuses.      Mouth/Throat: Mucous membranes are moist.  Pharynx is nonerythematous and nonedematous.  Uvula is midline. Neck: No stridor.   Hematological/Lymphatic/Immunilogical: No cervical lymphadenopathy. Cardiovascular: Normal rate, regular rhythm. Normal S1 and S2.  Good peripheral circulation. Respiratory: Normal respiratory effort without tachypnea or retractions. Lungs CTAB. Good air entry to the bases with no decreased or absent breath sounds. Musculoskeletal: Full range  of motion to all extremities. No gross deformities appreciated. Neurologic:  Normal speech and language. No gross focal neurologic deficits are appreciated.  Skin:  Skin is warm, dry and intact. No rash noted. Psychiatric: Mood and affect are normal. Speech and behavior are normal. Patient exhibits appropriate insight and judgement.   ____________________________________________   LABS (all labs ordered are listed, but only abnormal results are displayed)  Labs Reviewed - No data to display ____________________________________________  EKG   ____________________________________________  RADIOLOGY   No results found.  ____________________________________________    PROCEDURES  Procedure(s) performed:    Procedures    Medications - No data to display   ____________________________________________   INITIAL IMPRESSION / ASSESSMENT AND PLAN / ED COURSE  Pertinent labs & imaging results that were  available during my care of the patient were reviewed by me and considered in my medical decision making (see chart for details).  Review of the [place] CSRS was performed in accordance of the [facility] prior to dispensing any controlled drugs.     Patient's diagnosis is consistent with maxillary sinusitis.  Patient has 10 to 14-day history of increasing nasal congestion sinus pressure.  Symptoms and exam are most consistent with maxillary sinusitis.  No indication for labs or imaging at this time.  Differential included viral URI, sinusitis, allergic rhinitis.. Patient will be discharged home with prescriptions for Flonase and Augmentin. Patient is to follow up with primary care as needed or otherwise directed. Patient is given ED precautions to return to the ED for any worsening or new symptoms.     ____________________________________________  FINAL CLINICAL IMPRESSION(S) / ED DIAGNOSES  Final diagnoses:  Acute non-recurrent maxillary sinusitis      NEW MEDICATIONS STARTED DURING THIS VISIT:  ED Discharge Orders        Ordered    fluticasone (FLONASE) 50 MCG/ACT nasal spray  2 times daily     04/16/18 1833    amoxicillin-clavulanate (AUGMENTIN) 875-125 MG tablet  2 times daily     04/16/18 1833          This chart was dictated using voice recognition software/Dragon. Despite best efforts to proofread, errors can occur which can change the meaning. Any change was purely unintentional.    Racheal Patches, PA-C 04/16/18 1833    Minna Antis, MD 04/16/18 [phone]

## 2018-04-23 ENCOUNTER — Encounter: Payer: Self-pay | Admitting: Family Medicine

## 2018-05-01 ENCOUNTER — Encounter: Payer: Self-pay | Admitting: Family Medicine

## 2018-05-05 ENCOUNTER — Emergency Department
Admission: EM | Admit: 2018-05-05 | Discharge: 2018-05-05 | Disposition: A | Discharge status: Home / Self Care | Payer: BLUE CROSS/BLUE SHIELD | Attending: Emergency Medicine | Admitting: Emergency Medicine

## 2018-05-05 ENCOUNTER — Emergency Department: Payer: BLUE CROSS/BLUE SHIELD

## 2018-05-05 DIAGNOSIS — F329 Major depressive disorder, single episode, unspecified: Secondary | ICD-10-CM | POA: Diagnosis not present

## 2018-05-05 DIAGNOSIS — R51 Headache: Secondary | ICD-10-CM | POA: Diagnosis present

## 2018-05-05 DIAGNOSIS — Z79899 Other long term (current) drug therapy: Secondary | ICD-10-CM | POA: Insufficient documentation

## 2018-05-05 DIAGNOSIS — F419 Anxiety disorder, unspecified: Secondary | ICD-10-CM | POA: Diagnosis not present

## 2018-05-05 DIAGNOSIS — J329 Chronic sinusitis, unspecified: Secondary | ICD-10-CM | POA: Diagnosis not present

## 2018-05-05 DIAGNOSIS — J45909 Unspecified asthma, uncomplicated: Secondary | ICD-10-CM | POA: Insufficient documentation

## 2018-05-05 DIAGNOSIS — H5712 Ocular pain, left eye: Secondary | ICD-10-CM

## 2018-05-05 DIAGNOSIS — R519 Headache, unspecified: Secondary | ICD-10-CM

## 2018-05-05 MED ORDER — TETRACAINE HCL 0.5 % OP SOLN
1.0000 [drp] | Freq: Once | OPHTHALMIC | Status: DC
  Filled 2018-05-05: qty 4

## 2018-05-05 MED ORDER — AZITHROMYCIN 250 MG PO TABS: ORAL_TABLET | ORAL | 0 refills | Status: DC

## 2018-05-05 MED ORDER — FLUORESCEIN SODIUM 1 MG OP STRP
1.0000 | ORAL_STRIP | Freq: Once | OPHTHALMIC | Status: DC
  Filled 2018-05-05: qty 1

## 2018-05-05 MED ORDER — PREDNISONE 10 MG (21) PO TBPK: ORAL_TABLET | ORAL | 0 refills | Status: DC

## 2018-05-05 NOTE — ED Notes (Signed)
Patient with complaint of left eye pain times one week. Patient states that it is radiating to the back of her head. Patient denies any injury.

## 2018-05-05 NOTE — ED Triage Notes (Signed)
Patient c/o left eye pain X 1 week. Patient denies discharge and visual changes.

## 2018-05-05 NOTE — ED Provider Notes (Signed)
[facility] Emergency Department Provider Note  ____________________________________________   First MD Initiated Contact with Patient 05/05/18 2034     (approximate)  I have reviewed the triage vital signs and the nursing notes.   HISTORY  Chief Complaint Eye Pain    HPI Jasmine Gordon is a 32 y.o. female resents emergency department complaining of left eye pain for about 1 week.  She denies any injury.  She denies any change in her vision.  She denies any matting or drainage from the eye.  She denies any eye injury.  She states that she did have a sinus infection over a week ago.  She states that the eye hurts when she moves it.  She states the pain radiates through to the back of her head into the occipital area.  She denies any nausea or vomiting.  Past Medical History:  Diagnosis Date  . Allergy   . Anemia   . Anxiety   . Asthma   . Bronchiolitis   . Depression   . Irritability   . Migraine   . Obesity   . Panic attack   . PTSD (post-traumatic stress disorder) 2016    Patient Active Problem List   Diagnosis Date Noted  . Positive TB test 09/25/2017  . Allergic contact dermatitis due to metals 05/17/2017  . Pre-diabetes 03/06/2017  . Hyperlipidemia 03/06/2017  . Allergic reaction 02/08/2017  . Urticaria due to food allergy 02/08/2017  . Vitamin D deficiency 11/08/2016  . Iron deficiency anemia due to chronic blood loss 10/03/2016  . Menorrhagia with regular cycle 06/27/2016  . Mild persistent asthma 06/17/2016  . Anxiety and depression 06/17/2016  . Morbid obesity with BMI of 40.0-44.9, adult (HCC) 06/17/2016    Past Surgical History:  Procedure Laterality Date  . APPENDECTOMY    . Plantar warts      Prior to Admission medications   Medication Sig Start Date End Date Taking? Authorizing Provider  albuterol (PROVENTIL HFA;VENTOLIN HFA) 108 (90 Base) MCG/ACT inhaler Inhale 2 puffs into the lungs every 6 (six) hours as needed  for wheezing or shortness of breath. 06/17/16   Krebs, Laurel Dimmer, NP  albuterol (PROVENTIL) (2.5 MG/3ML) 0.083% nebulizer solution Take 3 mLs (2.5 mg total) by nebulization every 6 (six) hours as needed for wheezing or shortness of breath. 11/11/16   Karamalegos, Netta Neat, DO  azelastine (OPTIVAR) 0.05 % ophthalmic solution  02/28/17   [provider]  azithromycin (ZITHROMAX Z-PAK) 250 MG tablet 2 pills today then 1 pill a day for 4 days 05/05/18   Sherrie Mustache Roselyn Bering, PA-C  diazepam (VALIUM) 2 MG tablet Take 1 tablet (2 mg total) by mouth every 8 (eight) hours as needed for anxiety. 08/01/17 08/01/18  Emily Filbert, MD  EPINEPHrine 0.3 mg/0.3 mL IJ SOAJ injection INJECT INTRAMUSCULARLY AS DIRECTED 01/29/17   [provider]  famotidine (PEPCID) 40 MG tablet Take 1 tablet (40 mg total) by mouth every evening. 04/02/18 04/02/19  Myrna Blazer, MD  fluticasone (FLONASE) 50 MCG/ACT nasal spray Place 1 spray into both nostrils 2 (two) times daily. 04/16/18   Cuthriell, Delorise Royals, PA-C  loratadine (CLARITIN) 10 MG tablet Take 1 tablet (10 mg total) by mouth daily. 03/28/17 03/28/18  Rebecka Apley, MD  LORazepam (ATIVAN) 0.5 MG tablet Take 1 tablet (0.5 mg total) by mouth every 8 (eight) hours as needed for anxiety. 08/01/17 08/01/18  Emily Filbert, MD  magic mouthwash w/lidocaine SOLN Take 5 mLs  by mouth 4 (four) times daily. 06/10/17   Joni Reining, PA-C  omeprazole (PRILOSEC) 20 MG capsule Take 1 capsule (20 mg total) by mouth daily for 30 doses. 11/21/17 12/21/17  Darci Current, MD  omeprazole (PRILOSEC) 40 MG capsule Take 1 capsule (40 mg total) by mouth daily. 09/06/17 09/06/18  Rockne Menghini, MD  ondansetron (ZOFRAN ODT) 4 MG disintegrating tablet Take 1 tablet (4 mg total) by mouth every 8 (eight) hours as needed for nausea or vomiting. 09/06/17   Rockne Menghini, MD  oseltamivir (TAMIFLU) 75 MG capsule Take 1 capsule (75 mg total) by mouth daily.  01/04/18   Fisher, Roselyn Bering, PA-C  predniSONE (STERAPRED UNI-PAK 21 TAB) 10 MG (21) TBPK tablet Take 6 pills on day one then decrease by 1 pill each day 05/05/18   Faythe Ghee, PA-C  ranitidine (ZANTAC) 150 MG tablet Take 1 tablet (150 mg total) by mouth 2 (two) times daily. 02/11/17 02/11/18  Enid Derry, PA-C  simethicone (GAS-X) 80 MG chewable tablet Chew 1 tablet (80 mg total) by mouth 4 (four) times daily as needed for flatulence. 09/06/17 09/06/18  Rockne Menghini, MD    Allergies Reglan [metoclopramide]  Family History  Problem Relation Age of Onset  . Depression Mother   . Mental retardation Mother   . Bipolar disorder Mother   . Diabetes Father   . Stroke Maternal Grandfather   . Diabetes Maternal Grandfather     Social History Social History   Tobacco Use  . Smoking status: Never Smoker  . Smokeless tobacco: Never Used  Substance Use Topics  . Alcohol use: No  . Drug use: No    Review of Systems  Constitutional: No fever/chills, positive for headache Eyes: No visual changes.  Positive for left eye pain with radiation to the Sital area ENT: No sore throat. Respiratory: Denies cough Genitourinary: Negative for dysuria. Musculoskeletal: Negative for back pain. Skin: Negative for rash.    ____________________________________________   PHYSICAL EXAM:  VITAL SIGNS: ED Triage Vitals  Enc Vitals Group     BP 05/05/18 2014 125/82     Pulse Rate 05/05/18 2014 (!) 117     Resp 05/05/18 2014 20     Temp 05/05/18 2014 99.4 F (37.4 C)     Temp src --      SpO2 05/05/18 2014 97 %     Weight 05/05/18 2015 248 lb (112.5 kg)     Height 05/05/18 2015 5\' 7"  (1.702 m)     Head Circumference --      Peak Flow --      Pain Score 05/05/18 2015 9     Pain Loc --      Pain Edu? --      Excl. in GC? --     Constitutional: Alert and oriented. Well appearing and in no acute distress. Eyes: Conjunctivae are normal.  PERRL, tetracaine was applied, fluoro.stain  was applied, no corneal abrasion or fb noted.  No dye uptake was noted, pain is reproduced with eom Head: Atraumatic. Nose: No congestion/rhinnorhea.  Small amount of nasal mucosa swelling Mouth/Throat: Mucous membranes are moist.  Throat is normal Neck: Is supple, no lymphadenopathy is noted Cardiovascular: Normal rate, regular rhythm.  Heart sounds are normal Respiratory: Normal respiratory effort.  No retractions, lungs clear to auscultation GU: deferred Musculoskeletal: FROM all extremities, warm and well perfused Neurologic:  Normal speech and language.  Skin:  Skin is warm, dry and intact. No rash noted.  No  redness is noted around the left eye Psychiatric: Mood and affect are normal. Speech and behavior are normal.  ____________________________________________   LABS (all labs ordered are listed, but only abnormal results are displayed)  Labs Reviewed - No data to display ____________________________________________   ____________________________________________  RADIOLOGY  CT of the head and orbits without contrast are negative  ____________________________________________   PROCEDURES  Procedure(s) performed: No  Procedures    ____________________________________________   INITIAL IMPRESSION / ASSESSMENT AND PLAN / ED COURSE  Pertinent labs & imaging results that were available during my care of the patient were reviewed by me and considered in my medical decision making (see chart for details).  Patient is 32 year old female presents emergency department complaining of left eye pain for 1 week.  She states it radiates to the back of her head.  She also states there is pain with movement of the eye.  She has no change in her vision.  No family history of glaucoma.  She was recently treated for sinus infection.  With Augmentin  On physical exam the patient appears well.  Pain is reproduced with extraocular motions.  Positive for nasal swelling.  Some  tenderness across frontal sinus.  Remainder the exam is unremarkable  CT of the head and orbits are negative  Test results were discussed with patient.  She was given a prescription for Z-Pak and steroid pack.  She is to follow-up with her regular doctor if not better in 3 days.  She is to make an appointment with an eye doctor in case this is a optic migraine.  She states she will comply with our instructions.  She was discharged in stable condition.     As part of my medical decision making, I reviewed the following data within the electronic MEDICAL RECORD NUMBER Nursing notes reviewed and incorporated, Old chart reviewed, Radiograph reviewed CT of the head and orbits is negative, Notes from prior ED visits and [place] Controlled Substance Database  ____________________________________________   FINAL CLINICAL IMPRESSION(S) / ED DIAGNOSES  Final diagnoses:  Eye pain, left  Sinus headache      NEW MEDICATIONS STARTED DURING THIS VISIT:  New Prescriptions   AZITHROMYCIN (ZITHROMAX Z-PAK) 250 MG TABLET    2 pills today then 1 pill a day for 4 days   PREDNISONE (STERAPRED UNI-PAK 21 TAB) 10 MG (21) TBPK TABLET    Take 6 pills on day one then decrease by 1 pill each day     Note:  This document was prepared using Dragon voice recognition software and may include unintentional dictation errors.    Faythe GheeFisher, Susan W, PA-C 05/05/18 2250    Emily FilbertWilliams, Jonathan E, MD 05/05/18 [phone]

## 2018-05-05 NOTE — Discharge Instructions (Addendum)
Follow-up with your regular doctor if not better in 3 to 5 days.  Return emergency department if worsening.  Take medication as prescribed.  I would recommend you may also make an appointment with an eye doctor.

## 2018-06-17 IMAGING — CT CT HEAD W/O CM
3 of 6 series · 13 of 47 positions shown, 15 images · non-contrast
Comparison: Head CT 07/14/2015

CLINICAL DATA: Left eye pain radiating to the back head

EXAM:
CT HEAD AND ORBITS WITHOUT CONTRAST
TECHNIQUE: Contiguous axial images were obtained from the base of the skull
through the vertex without contrast. Multidetector CT imaging of the
orbits was performed using the standard protocol without intravenous
contrast.

[Series 2: head wo · axial · 0.47mm/px · z∈[-148,-18]mm · 8 of 32 slices shown, 10 images]
[im 3/32  brain]
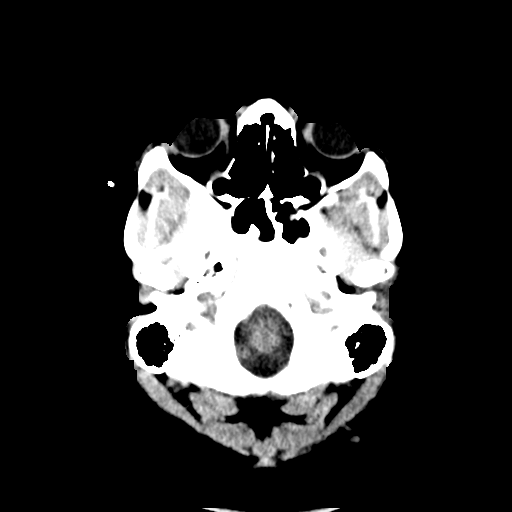
[im 3/32  bone]
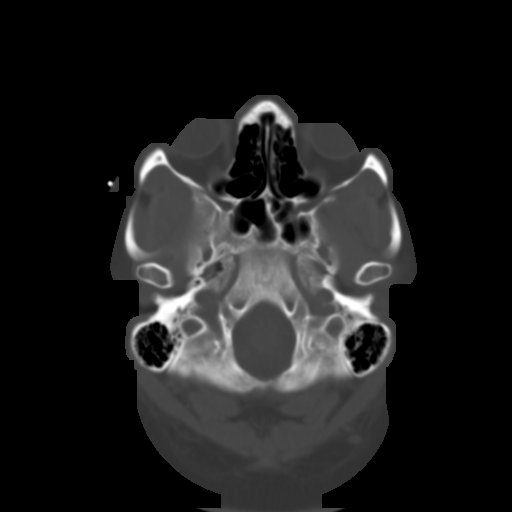
[im 6/32  brain]
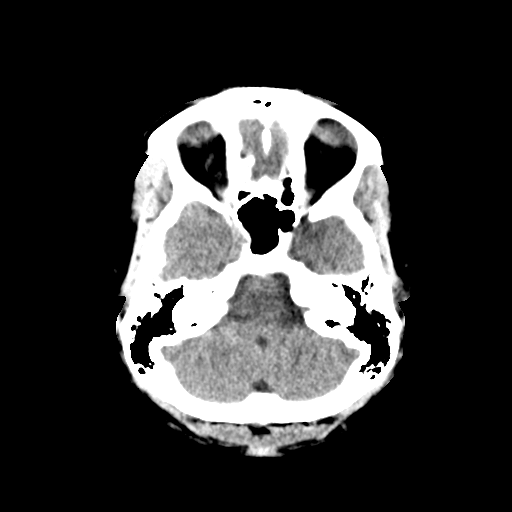
[im 12/32  brain]
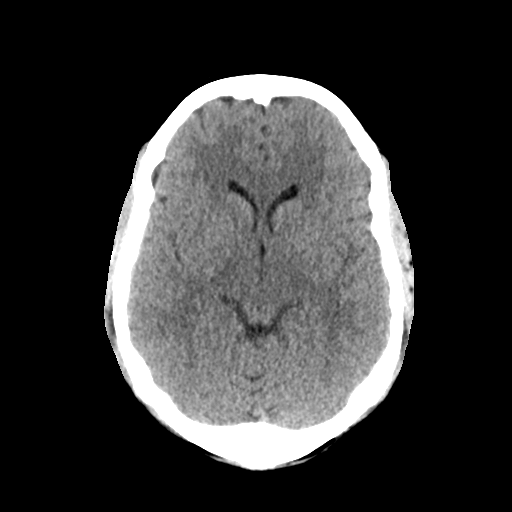
[im 15/32  brain]
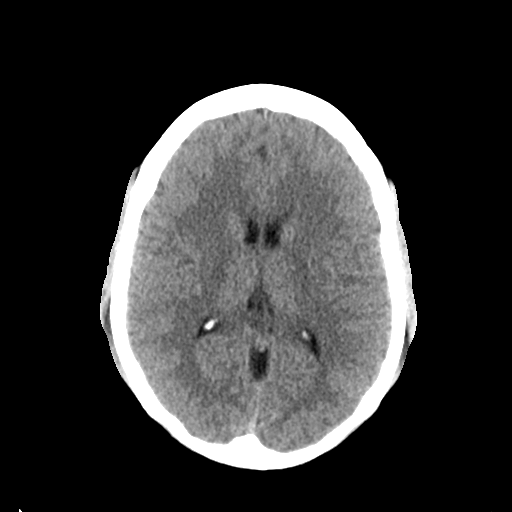
[im 17/32  brain]
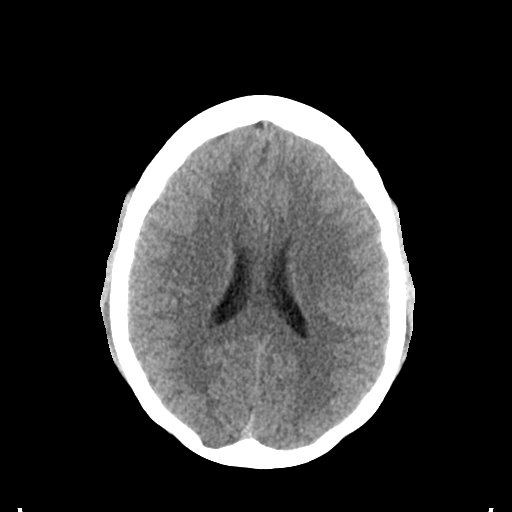
[im 17/32  bone]
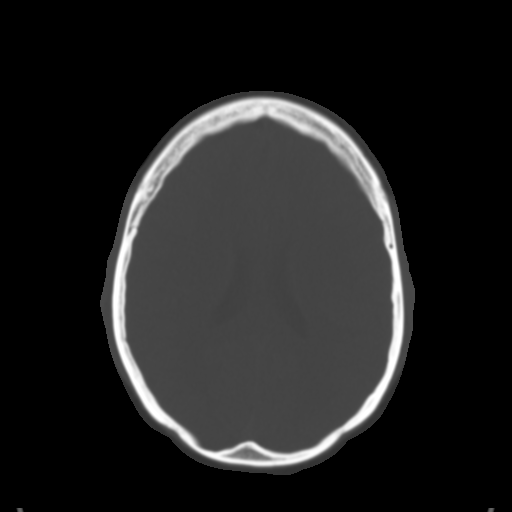
[im 20/32  brain]
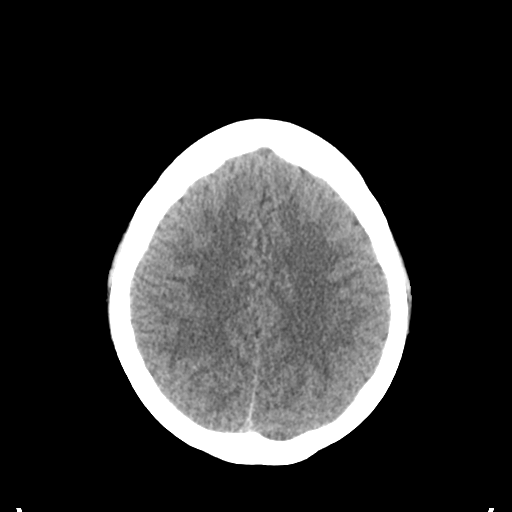
[im 26/32  brain]
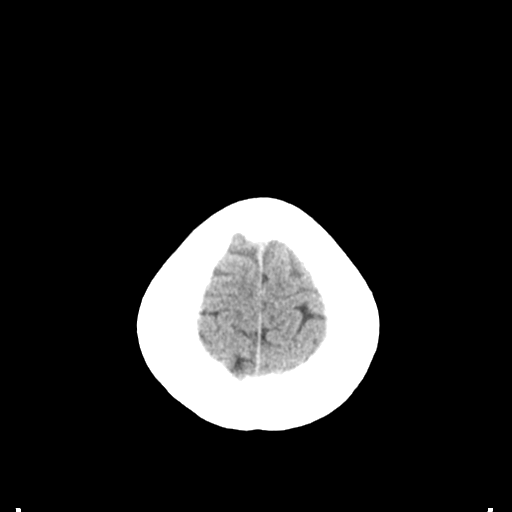
[im 29/32  brain]
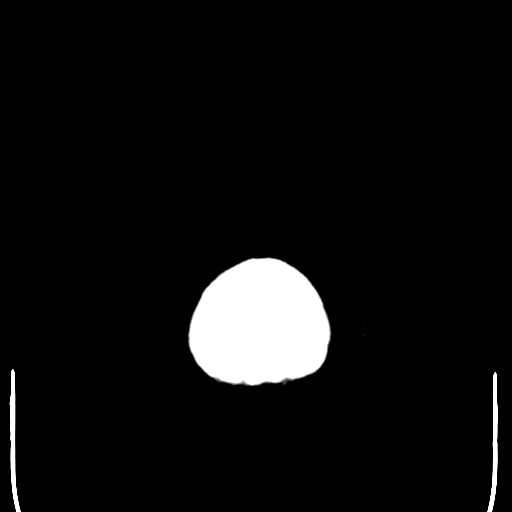

[Series 10: coronal soft · coronal · 0.21mm/px · 3 of 107 slices shown]
[im 36/107  brain]
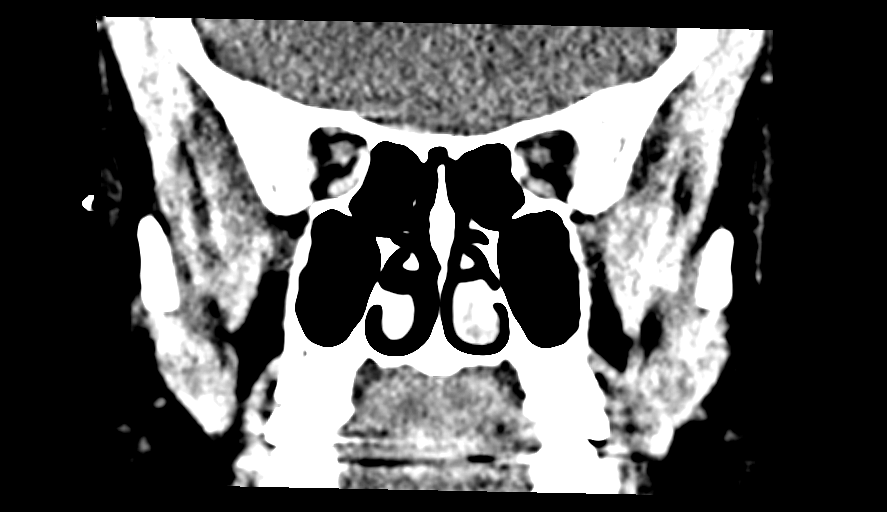
[im 54/107  brain]
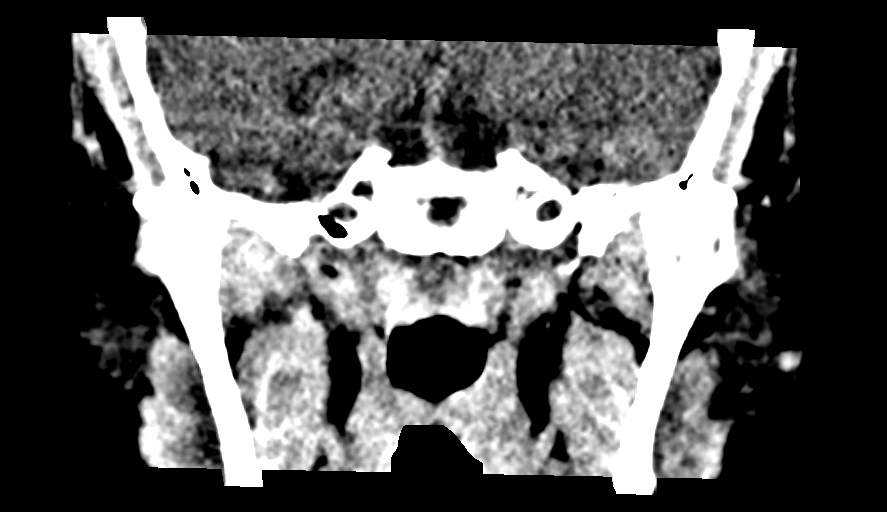
[im 71/107  brain]
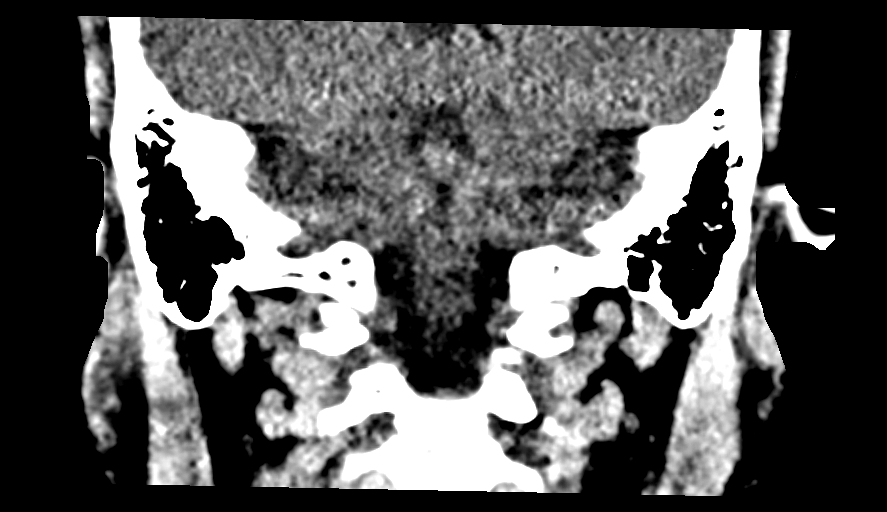

[Series 11: sagittal soft · sagittal · 0.20mm/px · 2 of 84 slices shown]
[im 28/84  brain]
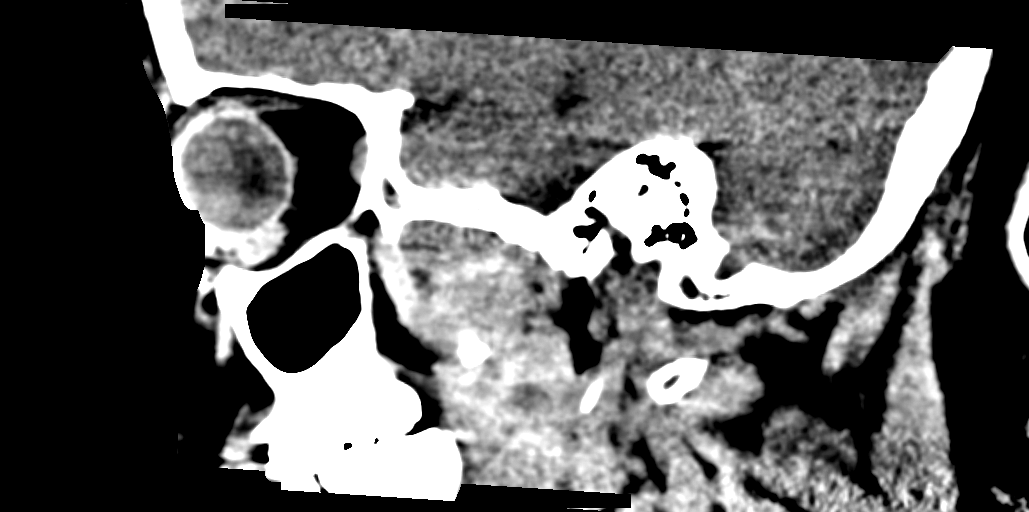
[im 56/84  brain]
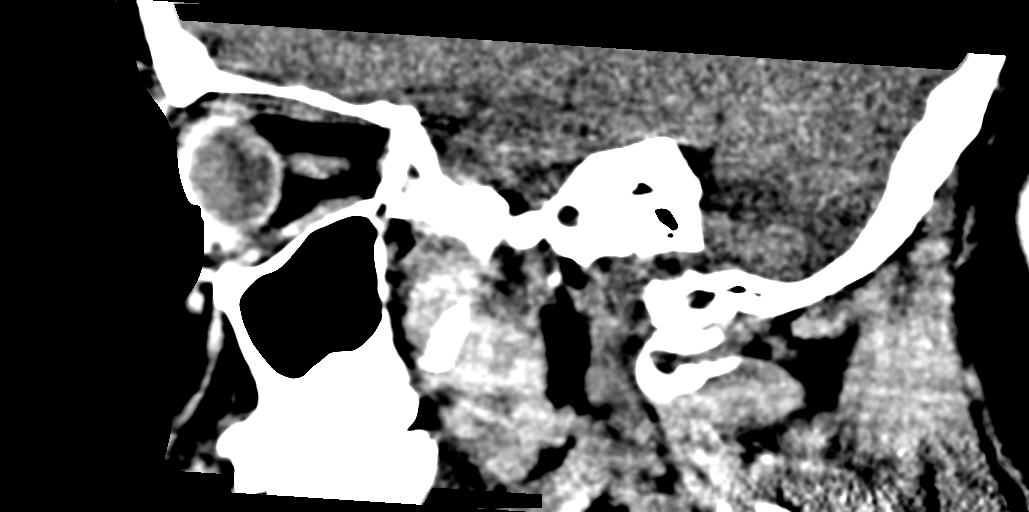

[13 of 47 positions shown; findings below may reference images not displayed]

FINDINGS: CT HEAD FINDINGS

Brain: No evidence of acute infarction, hemorrhage, hydrocephalus,
extra-axial collection or mass lesion/mass effect.

Vascular: No hyperdense vessel or unexpected calcification.

Skull: Normal. Negative for fracture or focal lesion.

Other: None

CT ORBITS FINDINGS

Orbits: No traumatic or inflammatory finding. Globes, optic nerves,
orbital fat, extraocular muscles, vascular structures, and lacrimal
glands are normal.

Visualized sinuses: Clear.

Soft tissues: Negative.
IMPRESSION: 1. Negative non contrasted CT appearance of the brain
2. Negative CT of the orbits without contrast

## 2018-06-25 ENCOUNTER — Other Ambulatory Visit: Payer: Self-pay

## 2018-06-25 ENCOUNTER — Emergency Department
Admission: EM | Admit: 2018-06-25 | Discharge: 2018-06-25 | Disposition: A | Discharge status: Home / Self Care | Payer: BLUE CROSS/BLUE SHIELD | Attending: Emergency Medicine | Admitting: Emergency Medicine

## 2018-06-25 ENCOUNTER — Encounter: Payer: Self-pay | Admitting: Emergency Medicine

## 2018-06-25 DIAGNOSIS — J45909 Unspecified asthma, uncomplicated: Secondary | ICD-10-CM | POA: Diagnosis not present

## 2018-06-25 DIAGNOSIS — J4530 Mild persistent asthma, uncomplicated: Secondary | ICD-10-CM | POA: Diagnosis not present

## 2018-06-25 DIAGNOSIS — Z79899 Other long term (current) drug therapy: Secondary | ICD-10-CM | POA: Diagnosis not present

## 2018-06-25 DIAGNOSIS — R55 Syncope and collapse: Secondary | ICD-10-CM | POA: Insufficient documentation

## 2018-06-25 LAB — URINALYSIS, COMPLETE (UACMP) WITH MICROSCOPIC
Bacteria, UA: NONE SEEN
Bilirubin Urine: NEGATIVE
Glucose, UA: NEGATIVE mg/dL
Ketones, ur: NEGATIVE mg/dL
Leukocytes, UA: NEGATIVE
Nitrite: NEGATIVE
Protein, ur: NEGATIVE mg/dL
Specific Gravity, Urine: 1.016 (ref 1.005–1.030)
pH: 6 (ref 5.0–8.0)

## 2018-06-25 LAB — BASIC METABOLIC PANEL
Anion gap: 8 (ref 5–15)
BUN: 13 mg/dL (ref 6–20)
CO2: 24 mmol/L (ref 22–32)
Calcium: 9.1 mg/dL (ref 8.9–10.3)
Chloride: 106 mmol/L (ref 98–111)
Creatinine, Ser: 0.7 mg/dL (ref 0.44–1.00)
GFR calc Af Amer: >60 mL/min (ref >60)
GFR calc non Af Amer: >60 mL/min (ref >60)
Glucose, Bld: 94 mg/dL (ref 70–99)
Potassium: 3.9 mmol/L (ref 3.5–5.1)
Sodium: 138 mmol/L (ref 135–145)

## 2018-06-25 LAB — CBC
HCT: 34.5 % — ABNORMAL LOW (ref 35.0–47.0)
Hemoglobin: 11.4 g/dL — ABNORMAL LOW (ref 12.0–16.0)
MCH: 26.7 pg (ref 26.0–34.0)
MCHC: 33 g/dL (ref 32.0–36.0)
MCV: 80.8 fL (ref 80.0–100.0)
Platelets: 238 10*3/uL (ref 150–440)
RBC: 4.28 MIL/uL (ref 3.80–5.20)
RDW: 14.2 % (ref 11.5–14.5)
WBC: 3.7 10*3/uL (ref 3.6–11.0)

## 2018-06-25 LAB — POCT PREGNANCY, URINE: Preg Test, Ur: NEGATIVE

## 2018-06-25 NOTE — ED Triage Notes (Signed)
Says headache started yesterday.  Today did not feel well since getting up.  Then about 20 min ago she was at the gym (had not started working out yet) and she felt dizziness.

## 2018-06-25 NOTE — ED Provider Notes (Signed)
[facility] Emergency Department Provider Note   ____________________________________________    I have reviewed the triage vital signs and the nursing notes.   HISTORY  Chief Complaint Lightheadedness    HPI Jasmine Gordon is a 32 y.o. female who presents with complaints of dizziness.  Patient reports that she went to the gym this morning and was walking on the treadmill when she began feeling lightheaded.  She went to the locker room and was able to sit down and felt better.  Denies chest pain or pleurisy.  No nausea or vomiting.  Currently feels quite well.  No history of syncopal episodes.  No new medications.  She did not eat breakfast this morning.   Past Medical History:  Diagnosis Date  . Allergy   . Anemia   . Anxiety   . Asthma   . Bronchiolitis   . Depression   . Irritability   . Migraine   . Obesity   . Panic attack   . PTSD (post-traumatic stress disorder) 2016    Patient Active Problem List   Diagnosis Date Noted  . Positive TB test 09/25/2017  . Allergic contact dermatitis due to metals 05/17/2017  . Pre-diabetes 03/06/2017  . Hyperlipidemia 03/06/2017  . Allergic reaction 02/08/2017  . Urticaria due to food allergy 02/08/2017  . Vitamin D deficiency 11/08/2016  . Iron deficiency anemia due to chronic blood loss 10/03/2016  . Menorrhagia with regular cycle 06/27/2016  . Mild persistent asthma 06/17/2016  . Anxiety and depression 06/17/2016  . Morbid obesity with BMI of 40.0-44.9, adult (HCC) 06/17/2016    Past Surgical History:  Procedure Laterality Date  . APPENDECTOMY    . Plantar warts      Prior to Admission medications   Medication Sig Start Date End Date Taking? Authorizing Provider  albuterol (PROVENTIL HFA;VENTOLIN HFA) 108 (90 Base) MCG/ACT inhaler Inhale 2 puffs into the lungs every 6 (six) hours as needed for wheezing or shortness of breath. 06/17/16   Krebs, Laurel DimmerAmy Lauren, NP  albuterol (PROVENTIL)  (2.5 MG/3ML) 0.083% nebulizer solution Take 3 mLs (2.5 mg total) by nebulization every 6 (six) hours as needed for wheezing or shortness of breath. 11/11/16   Karamalegos, Netta NeatAlexander J, DO  azelastine (OPTIVAR) 0.05 % ophthalmic solution  02/28/17   [provider]  azithromycin (ZITHROMAX Z-PAK) 250 MG tablet 2 pills today then 1 pill a day for 4 days 05/05/18   Sherrie MustacheFisher, Roselyn BeringSusan W, PA-C  diazepam (VALIUM) 2 MG tablet Take 1 tablet (2 mg total) by mouth every 8 (eight) hours as needed for anxiety. 08/01/17 08/01/18  Emily FilbertWilliams, Jonathan E, MD  EPINEPHrine 0.3 mg/0.3 mL IJ SOAJ injection INJECT INTRAMUSCULARLY AS DIRECTED 01/29/17   [provider]  famotidine (PEPCID) 40 MG tablet Take 1 tablet (40 mg total) by mouth every evening. 04/02/18 04/02/19  Myrna BlazerSchaevitz, David Matthew, MD  fluticasone (FLONASE) 50 MCG/ACT nasal spray Place 1 spray into both nostrils 2 (two) times daily. 04/16/18   Cuthriell, Delorise RoyalsJonathan D, PA-C  loratadine (CLARITIN) 10 MG tablet Take 1 tablet (10 mg total) by mouth daily. 03/28/17 03/28/18  Rebecka ApleyWebster, Allison P, MD  LORazepam (ATIVAN) 0.5 MG tablet Take 1 tablet (0.5 mg total) by mouth every 8 (eight) hours as needed for anxiety. 08/01/17 08/01/18  Emily FilbertWilliams, Jonathan E, MD  magic mouthwash w/lidocaine SOLN Take 5 mLs by mouth 4 (four) times daily. 06/10/17   Joni ReiningSmith, Ronald K, PA-C  omeprazole (PRILOSEC) 20 MG capsule Take 1 capsule (20  mg total) by mouth daily for 30 doses. 11/21/17 12/21/17  Darci Current, MD  omeprazole (PRILOSEC) 40 MG capsule Take 1 capsule (40 mg total) by mouth daily. 09/06/17 09/06/18  Rockne Menghini, MD  ondansetron (ZOFRAN ODT) 4 MG disintegrating tablet Take 1 tablet (4 mg total) by mouth every 8 (eight) hours as needed for nausea or vomiting. 09/06/17   Rockne Menghini, MD  oseltamivir (TAMIFLU) 75 MG capsule Take 1 capsule (75 mg total) by mouth daily. 01/04/18   Fisher, Roselyn Bering, PA-C  predniSONE (STERAPRED UNI-PAK 21 TAB) 10 MG (21) TBPK tablet  Take 6 pills on day one then decrease by 1 pill each day 05/05/18   Faythe Ghee, PA-C  ranitidine (ZANTAC) 150 MG tablet Take 1 tablet (150 mg total) by mouth 2 (two) times daily. 02/11/17 02/11/18  Enid Derry, PA-C  simethicone (GAS-X) 80 MG chewable tablet Chew 1 tablet (80 mg total) by mouth 4 (four) times daily as needed for flatulence. 09/06/17 09/06/18  Rockne Menghini, MD     Allergies Reglan [metoclopramide]  Family History  Problem Relation Age of Onset  . Depression Mother   . Mental retardation Mother   . Bipolar disorder Mother   . Diabetes Father   . Stroke Maternal Grandfather   . Diabetes Maternal Grandfather     Social History Social History   Tobacco Use  . Smoking status: Never Smoker  . Smokeless tobacco: Never Used  Substance Use Topics  . Alcohol use: No  . Drug use: No    Review of Systems  Constitutional: No fever/chills Eyes: No visual changes.  ENT: No sore throat. Cardiovascular: Denies chest pain.  No palpitations Respiratory: Denies shortness of breath. Gastrointestinal: No abdominal pain.  No nausea, no vomiting.   Genitourinary: Negative for dysuria. Musculoskeletal: Negative for back pain. Skin: Negative for rash. Neurological: Negative for headaches or weakness   ____________________________________________   PHYSICAL EXAM:  VITAL SIGNS: ED Triage Vitals  Enc Vitals Group     BP 06/25/18 1030 (!) 120/59     Pulse Rate 06/25/18 1030 (!) 104     Resp 06/25/18 1030 14     Temp 06/25/18 1030 99.1 F (37.3 C)     Temp src --      SpO2 06/25/18 1030 100 %     Weight 06/25/18 1031 112.9 kg (249 lb)     Height 06/25/18 1031 1.702 m (5\' 7" )     Head Circumference --      Peak Flow --      Pain Score 06/25/18 1123 0     Pain Loc --      Pain Edu? --      Excl. in GC? --     Constitutional: Alert and oriented. No acute distress. Pleasant and interactive Eyes: Conjunctivae are normal.   Nose: No  congestion/rhinnorhea. Mouth/Throat: Mucous membranes are moist.    Cardiovascular: Normal rate, regular rhythm. Grossly normal heart sounds.  Good peripheral circulation. Respiratory: Normal respiratory effort.  No retractions. Lungs CTAB. Gastrointestinal: Soft and nontender. No distention.  No CVA tenderness.  Musculoskeletal: No lower extremity tenderness nor edema.  Warm and well perfused Neurologic:  Normal speech and language. No gross focal neurologic deficits are appreciated.  Skin:  Skin is warm, dry and intact. No rash noted. Psychiatric: Mood and affect are normal. Speech and behavior are normal.  ____________________________________________   LABS (all labs ordered are listed, but only abnormal results are displayed)  Labs Reviewed  CBC -  Abnormal; Notable for the following components:      Result Value   Hemoglobin 11.4 (*)    HCT 34.5 (*)    All other components within normal limits  URINALYSIS, COMPLETE (UACMP) WITH MICROSCOPIC - Abnormal; Notable for the following components:   Color, Urine YELLOW (*)    APPearance CLEAR (*)    Hgb urine dipstick SMALL (*)    All other components within normal limits  BASIC METABOLIC PANEL  POCT PREGNANCY, URINE   ____________________________________________  EKG  ED ECG REPORT I, Jene Every, the attending physician, personally viewed and interpreted this ECG.  Date: 06/25/2018  Rhythm: normal sinus rhythm QRS Axis: normal Intervals: normal ST/T Wave abnormalities: normal Narrative Interpretation: no evidence of acute ischemia  ____________________________________________  RADIOLOGY  None ____________________________________________   PROCEDURES  Procedure(s) performed: No  Procedures   Critical Care performed: No ____________________________________________   INITIAL IMPRESSION / ASSESSMENT AND PLAN / ED COURSE  Pertinent labs & imaging results that were available during my care of the patient  were reviewed by me and considered in my medical decision making (see chart for details).  Patient well-appearing in no acute distress, a symptom medic in the emergency department.  EKG is quite reassuring.  Lab work is unremarkable.  Possible vasovagal versus dehydration, recommend outpatient follow-up with PCP.   ____________________________________________   FINAL CLINICAL IMPRESSION(S) / ED DIAGNOSES  Final diagnoses:  Near syncope        Note:  This document was prepared using Dragon voice recognition software and may include unintentional dictation errors.    Jene Every, MD 06/25/18 1620

## 2018-07-04 ENCOUNTER — Telehealth: Payer: Self-pay | Admitting: Obstetrics and Gynecology

## 2018-07-04 ENCOUNTER — Encounter: Payer: Self-pay | Admitting: Obstetrics and Gynecology

## 2018-07-04 ENCOUNTER — Ambulatory Visit (INDEPENDENT_AMBULATORY_CARE_PROVIDER_SITE_OTHER): Payer: BLUE CROSS/BLUE SHIELD

## 2018-07-04 ENCOUNTER — Ambulatory Visit (INDEPENDENT_AMBULATORY_CARE_PROVIDER_SITE_OTHER): Payer: BLUE CROSS/BLUE SHIELD | Admitting: Obstetrics and Gynecology

## 2018-07-04 VITALS — BP 120/84 | HR 92 | Ht 67.0 in | Wt 250.0 lb

## 2018-07-04 DIAGNOSIS — N941 Unspecified dyspareunia: Secondary | ICD-10-CM

## 2018-07-04 DIAGNOSIS — T8332XA Displacement of intrauterine contraceptive device, initial encounter: Secondary | ICD-10-CM | POA: Diagnosis not present

## 2018-07-04 DIAGNOSIS — Z30431 Encounter for routine checking of intrauterine contraceptive device: Secondary | ICD-10-CM

## 2018-07-04 DIAGNOSIS — Z30433 Encounter for removal and reinsertion of intrauterine contraceptive device: Secondary | ICD-10-CM

## 2018-07-04 MED ORDER — MISOPROSTOL 100 MCG PO TABS
100.0000 ug | ORAL_TABLET | Freq: Once | ORAL | 0 refills | Status: DC
Start: 2018-07-04 — End: 2018-08-22

## 2018-07-04 NOTE — Telephone Encounter (Signed)
Patient is schedule for Paraguard removal and insertion 07/05/18 at 9:50. Patient would like her prescription for Cytotec sent to pharmacy please

## 2018-07-04 NOTE — Telephone Encounter (Signed)
Pt aware of malpositioned IUD on GYN u/s. Has paragard. Wants replacement. RTO tomorrow for procedure. Rx cytotec/NSAIDs.

## 2018-07-04 NOTE — Telephone Encounter (Signed)
Rx already sent.

## 2018-07-04 NOTE — Progress Notes (Signed)
   Chief Complaint  Patient presents with  . Follow-up    IUD check, strings feel longer per pt     History of Present Illness:  Jasmine Gordon is a 32 y.o. that had a Paragard IUD placed approximately 2 1/2 yrs ago. Since that time, she denies non-menstrual bleeding, vaginal d/c, heavy bleeding. Menses are monthly, last 7 days, no BTB, mild dysmen before period. Pt noticed discomfort with sex a couple days ago and IUD strings feel longer now. Pt concerned about placement. No bleeding with sex, no other pelvic pain.  Patient Active Problem List   Diagnosis Date Noted  . Positive TB test 09/25/2017  . Allergic contact dermatitis due to metals 05/17/2017  . Pre-diabetes 03/06/2017  . Hyperlipidemia 03/06/2017  . Allergic reaction 02/08/2017  . Urticaria due to food allergy 02/08/2017  . Vitamin D deficiency 11/08/2016  . Iron deficiency anemia due to chronic blood loss 10/03/2016  . Menorrhagia with regular cycle 06/27/2016  . Mild persistent asthma 06/17/2016  . Anxiety and depression 06/17/2016  . Morbid obesity with BMI of 40.0-44.9, adult (HCC) 06/17/2016   Past Surgical History:  Procedure Laterality Date  . APPENDECTOMY    . Plantar warts     Family History  Problem Relation Age of Onset  . Depression Mother   . Mental retardation Mother   . Bipolar disorder Mother   . Diabetes Father   . Stroke Maternal Grandfather   . Diabetes Maternal Grandfather      Review of Systems  Constitutional: Negative for fever.  Gastrointestinal: Negative for blood in stool, constipation, diarrhea, nausea and vomiting.  Genitourinary: Positive for dyspareunia. Negative for dysuria, flank pain, frequency, hematuria, urgency, vaginal bleeding, vaginal discharge and vaginal pain.  Musculoskeletal: Negative for back pain.  Skin: Negative for rash.    Physical Exam:  BP 120/84   Pulse 92   Ht 5\' 7"  (1.702 m)   Wt 250 lb (113.4 kg)   BMI 39.16 kg/m  Body mass index is 39.16  kg/m.  Pelvic exam:  Two IUD strings present seen coming from the cervical os. Longer strings in general but don't know what is normal for pt. EGBUS, vaginal vault and cervix: within normal limits  Assessment/Plan:   Encounter for routine checking of intrauterine contraceptive device (IUD) - IUD strings in place. Given importance of paraguard placement in uterus, will check GYN u/s. Will call with results.  - Plan: US PELVIS TRANSVANGINAL NON-OB (TV ONLY)  Dyspareunia in female - Check u/s.  - Plan: US PELVIS TRANSVANGINAL NON-OB (TV ONLY)  Malpositioned intrauterine device (IUD), initial encounter - Plan: US PELVIS TRANSVANGINAL NON-OB (TV ONLY)  Return for 11:30 GYN u/s for IUD check.  Labarron Durnin B. Brittley Regner, PA-C 07/04/2018 2:28 PM

## 2018-07-04 NOTE — Patient Instructions (Signed)
I value your feedback and entrusting us with your care. If you get a [facility] patient survey, I would appreciate you taking the time to let us know about your experience today. Thank you! 

## 2018-07-05 ENCOUNTER — Ambulatory Visit (INDEPENDENT_AMBULATORY_CARE_PROVIDER_SITE_OTHER): Payer: BLUE CROSS/BLUE SHIELD | Admitting: Obstetrics and Gynecology

## 2018-07-05 ENCOUNTER — Encounter: Payer: Self-pay | Admitting: Obstetrics and Gynecology

## 2018-07-05 VITALS — BP 130/100 | HR 101 | Ht 67.0 in | Wt 250.0 lb

## 2018-07-05 DIAGNOSIS — Z30433 Encounter for removal and reinsertion of intrauterine contraceptive device: Secondary | ICD-10-CM

## 2018-07-05 MED ORDER — PARAGARD INTRAUTERINE COPPER IU IUD: INTRAUTERINE_SYSTEM | Freq: Once | INTRAUTERINE | Status: DC

## 2018-07-05 NOTE — Progress Notes (Signed)
   Chief Complaint  Patient presents with  . Contraception    IUD removal/reinsertion Paragard     History of Present Illness:  Jasmine Gordon is a 32 y.o. that had a Paragard IUD placed approximately 2 1/2 years ago. Since that time, she has done really well with it. Noted longer strings a couple days ago and dyspareunia. GYN u/s yesterday showed IUD in incorrect location/too low in the uterus. Pt here for IUD removal and replacement with new Paragard. Happy with IUD overall.   BP (!) 130/100   Pulse (!) 101   Ht 5\' 7"  (1.702 m)   Wt 250 lb (113.4 kg)   LMP 06/13/2018 (Exact Date)   BMI 39.16 kg/m   Pelvic exam:  Two IUD strings present seen coming from the cervical os. EGBUS, vaginal vault and cervix: within normal limits  IUD Removal Strings of IUD identified and grasped.  IUD removed without problem with [place] forceps.  Pt tolerated this well.  IUD noted to be intact.  IUD Insertion Procedure Note Patient identified, informed consent performed, consent signed.   Discussed risks of irregular bleeding, cramping, infection, malpositioning or misplacement of the IUD outside the uterus which may require further procedure such as laparoscopy, risk of failure <1%. Time out was performed.   Speculum placed in the vagina.  Cervix visualized.  Cleaned with Betadine x 2.  Grasped anteriorly with a single tooth tenaculum.  Uterus sounded to 8.0 cm.  IUD placed per manufacturer's recommendations.  Strings trimmed to 3 cm. Tenaculum was removed, good hemostasis noted.  Patient tolerated procedure well.   ASSESSMENT:  Encounter for removal and reinsertion of intrauterine contraceptive device (IUD) - Plan: PARAGARD INTRAUTERINE COPPER IUD   Meds ordered this encounter  Medications  . PARAGARD INTRAUTERINE COPPER IUD    Plan:  Patient was given post-procedure instructions.  She was advised to have backup contraception for one week.   Call if you are having increasing pain,  cramps or bleeding or if you have a fever greater than 100.4 degrees F., shaking chills, nausea or vomiting. Patient was also asked to check IUD strings periodically and follow up in 4 weeks for IUD check.  Return in about 4 weeks (around 08/02/2018) for IUD f/u.  Alicia B. Copland, PA-C 07/05/2018 10:19 AM

## 2018-07-05 NOTE — Patient Instructions (Signed)
I value your feedback and entrusting us with your care. If you get a [facility] patient survey, I would appreciate you taking the time to let us know about your experience today. Thank you!  [facility] [phone]  Instructions after IUD insertion  Most women experience no significant problems after insertion of an IUD, however minor cramping and spotting for a few days is common. Cramps may be treated with ibuprofen 800mg every 8 hours or Tylenol 650 mg every 4 hours. Contact [facility] immediately if you experience any of the following symptoms during the next week: temperature >99.6 degrees, worsening pelvic pain, abdominal pain, fainting, unusually heavy vaginal bleeding, foul vaginal discharge, or if you think you have expelled the IUD.  Nothing inserted in the vagina for 48 hours. You will be scheduled for a follow up visit in approximately four weeks.  You should check monthly to be sure you can feel the IUD strings in the upper vagina. If you are having a monthly period, try to check after each period. If you cannot feel the IUD strings,  contact [facility] immediately so we can do an exam to determine if the IUD has been expelled.   Please use backup protection until we can confirm the IUD is in place.  Call [facility] if you are exposed to or diagnosed with a sexually transmitted infection, as we will need to discuss whether it is safe for you to continue using an IUD.   

## 2018-07-20 ENCOUNTER — Telehealth: Payer: Self-pay

## 2018-07-20 ENCOUNTER — Telehealth: Payer: Self-pay | Admitting: *Deleted

## 2018-07-20 ENCOUNTER — Inpatient Hospital Stay: Payer: BLUE CROSS/BLUE SHIELD | Attending: Hematology and Oncology

## 2018-07-20 ENCOUNTER — Other Ambulatory Visit: Payer: Self-pay

## 2018-07-20 DIAGNOSIS — Z79899 Other long term (current) drug therapy: Secondary | ICD-10-CM | POA: Diagnosis not present

## 2018-07-20 DIAGNOSIS — D500 Iron deficiency anemia secondary to blood loss (chronic): Secondary | ICD-10-CM | POA: Insufficient documentation

## 2018-07-20 DIAGNOSIS — N920 Excessive and frequent menstruation with regular cycle: Secondary | ICD-10-CM | POA: Insufficient documentation

## 2018-07-20 LAB — CBC WITH DIFFERENTIAL/PLATELET
Basophils Absolute: 0 10*3/uL (ref 0–0.1)
Basophils Relative: 1 %
Eosinophils Absolute: 0.1 10*3/uL (ref 0–0.7)
Eosinophils Relative: 1 %
HCT: 32.4 % — ABNORMAL LOW (ref 35.0–47.0)
Hemoglobin: 10.4 g/dL — ABNORMAL LOW (ref 12.0–16.0)
Lymphocytes Relative: 38 %
Lymphs Abs: 1.6 10*3/uL (ref 1.0–3.6)
MCH: 26.2 pg (ref 26.0–34.0)
MCHC: 32 g/dL (ref 32.0–36.0)
MCV: 81.9 fL (ref 80.0–100.0)
Monocytes Absolute: 0.3 10*3/uL (ref 0.2–0.9)
Monocytes Relative: 6 %
Neutro Abs: 2.4 10*3/uL (ref 1.4–6.5)
Neutrophils Relative %: 54 %
Platelets: 226 10*3/uL (ref 150–440)
RBC: 3.96 MIL/uL (ref 3.80–5.20)
RDW: 14.9 % — ABNORMAL HIGH (ref 11.5–14.5)
WBC: 4.4 10*3/uL (ref 3.6–11.0)

## 2018-07-20 LAB — IRON AND TIBC
Iron: 67 ug/dL (ref 28–170)
Saturation Ratios: 21 % (ref 10.4–31.8)
TIBC: 326 ug/dL (ref 250–450)
UIBC: 260 ug/dL

## 2018-07-20 LAB — FERRITIN: Ferritin: 10 ng/mL — ABNORMAL LOW (ref 11–307)

## 2018-07-20 NOTE — Telephone Encounter (Signed)
We can discuss this request for Rheumatology and lab testing on 07/25/18. Will likely need to check rheum screening labs prior to referral, will review with her at that time.  Saralyn PilarAlexander Menachem Urbanek, DO [facility] 07/20/2018, 5:45 PM

## 2018-07-20 NOTE — Telephone Encounter (Signed)
Per VO Dr Merlene Pullingorcoran Patient can come in for lab check CBC, IIBC, Ferr today, but we cannot infuse her until some time next week.  Patient accepts to come in this afternoon for labs and will await to see if she needs iron until next week

## 2018-07-20 NOTE — Telephone Encounter (Signed)
Patient called and requesting a new referral to the rheumatologist.  She has appointment with you on Wednesday 07/25/18

## 2018-07-20 NOTE — Telephone Encounter (Addendum)
Patient called service and states that she thinks her iron is getting low and thinks she will be needing an infusion soon. Patient was a No Show for hewr appointment 08/24/17. Please advise

## 2018-07-24 ENCOUNTER — Telehealth: Payer: Self-pay | Admitting: *Deleted

## 2018-07-24 NOTE — Telephone Encounter (Signed)
Called patient to inform her that her ferritin is low.  Per MD patient should be seen in clinic this week as well as receive venofer.  Patient in agreement.  Will send to scheduling.

## 2018-07-24 NOTE — Telephone Encounter (Signed)
-----   Message from Rosey Bath, MD sent at 07/22/2018 12:59 PM EDT ----- Regarding: Please schedule follow-up with me this week + IV iron  Patient is anemic.  Ferritin low.  We have not seen her since 2018.  Please schedule follow-up with me this week + IV iron  M

## 2018-07-25 ENCOUNTER — Encounter: Payer: Self-pay | Admitting: Obstetrics and Gynecology

## 2018-07-25 ENCOUNTER — Other Ambulatory Visit: Payer: Self-pay

## 2018-07-25 ENCOUNTER — Encounter: Payer: Self-pay | Admitting: Family Medicine

## 2018-07-25 ENCOUNTER — Ambulatory Visit (INDEPENDENT_AMBULATORY_CARE_PROVIDER_SITE_OTHER): Payer: BLUE CROSS/BLUE SHIELD | Admitting: Family Medicine

## 2018-07-25 ENCOUNTER — Emergency Department
Admission: EM | Admit: 2018-07-25 | Discharge: 2018-07-26 | Disposition: A | Discharge status: Home / Self Care | Payer: BLUE CROSS/BLUE SHIELD | Attending: Emergency Medicine | Admitting: Emergency Medicine

## 2018-07-25 ENCOUNTER — Encounter: Payer: Self-pay | Admitting: *Deleted

## 2018-07-25 VITALS — BP 122/85 | HR 91 | Temp 98.6°F | Resp 16 | Ht 65.0 in | Wt 244.0 lb

## 2018-07-25 DIAGNOSIS — E559 Vitamin D deficiency, unspecified: Secondary | ICD-10-CM

## 2018-07-25 DIAGNOSIS — K5900 Constipation, unspecified: Secondary | ICD-10-CM | POA: Diagnosis not present

## 2018-07-25 DIAGNOSIS — E538 Deficiency of other specified B group vitamins: Secondary | ICD-10-CM

## 2018-07-25 DIAGNOSIS — R42 Dizziness and giddiness: Secondary | ICD-10-CM

## 2018-07-25 DIAGNOSIS — J4530 Mild persistent asthma, uncomplicated: Secondary | ICD-10-CM | POA: Diagnosis not present

## 2018-07-25 DIAGNOSIS — R0602 Shortness of breath: Secondary | ICD-10-CM | POA: Insufficient documentation

## 2018-07-25 DIAGNOSIS — E860 Dehydration: Secondary | ICD-10-CM | POA: Diagnosis not present

## 2018-07-25 DIAGNOSIS — Z79899 Other long term (current) drug therapy: Secondary | ICD-10-CM | POA: Insufficient documentation

## 2018-07-25 DIAGNOSIS — Z6841 Body Mass Index (BMI) 40.0 and over, adult: Secondary | ICD-10-CM

## 2018-07-25 DIAGNOSIS — E876 Hypokalemia: Secondary | ICD-10-CM

## 2018-07-25 DIAGNOSIS — D500 Iron deficiency anemia secondary to blood loss (chronic): Secondary | ICD-10-CM | POA: Diagnosis not present

## 2018-07-25 DIAGNOSIS — Z975 Presence of (intrauterine) contraceptive device: Secondary | ICD-10-CM

## 2018-07-25 LAB — BASIC METABOLIC PANEL
Anion gap: 8 (ref 5–15)
BUN: 15 mg/dL (ref 6–20)
CO2: 24 mmol/L (ref 22–32)
Calcium: 9.2 mg/dL (ref 8.9–10.3)
Chloride: 107 mmol/L (ref 98–111)
Creatinine, Ser: 0.72 mg/dL (ref 0.44–1.00)
GFR calc Af Amer: >60 mL/min (ref >60)
GFR calc non Af Amer: >60 mL/min (ref >60)
Glucose, Bld: 103 mg/dL — ABNORMAL HIGH (ref 70–99)
Potassium: 3.4 mmol/L — ABNORMAL LOW (ref 3.5–5.1)
Sodium: 139 mmol/L (ref 135–145)

## 2018-07-25 LAB — CBC
HCT: 35 % (ref 35.0–47.0)
Hemoglobin: 11.5 g/dL — ABNORMAL LOW (ref 12.0–16.0)
MCH: 26.7 pg (ref 26.0–34.0)
MCHC: 32.7 g/dL (ref 32.0–36.0)
MCV: 81.7 fL (ref 80.0–100.0)
Platelets: 220 10*3/uL (ref 150–440)
RBC: 4.28 MIL/uL (ref 3.80–5.20)
RDW: 15.2 % — ABNORMAL HIGH (ref 11.5–14.5)
WBC: 5.3 10*3/uL (ref 3.6–11.0)

## 2018-07-25 LAB — URINALYSIS, COMPLETE (UACMP) WITH MICROSCOPIC
Bilirubin Urine: NEGATIVE
Glucose, UA: NEGATIVE mg/dL
Ketones, ur: 5 mg/dL — AB
Leukocytes, UA: NEGATIVE
Nitrite: NEGATIVE
Protein, ur: 30 mg/dL — AB
Specific Gravity, Urine: 1.034 — ABNORMAL HIGH (ref 1.005–1.030)
pH: 5 (ref 5.0–8.0)

## 2018-07-25 LAB — PREGNANCY, URINE: Preg Test, Ur: NEGATIVE

## 2018-07-25 MED ORDER — POTASSIUM CHLORIDE CRYS ER 20 MEQ PO TBCR
40.0000 meq | EXTENDED_RELEASE_TABLET | Freq: Once | ORAL | Status: AC
  Administered 2018-07-26: 40 meq via ORAL
  Filled 2018-07-25: qty 2

## 2018-07-25 MED ORDER — DEXTROSE-NACL 5-0.45 % IV SOLN
INTRAVENOUS | Status: DC
  Administered 2018-07-26: 01:00:00 via INTRAVENOUS

## 2018-07-25 NOTE — Patient Instructions (Addendum)
Thank you for coming to the office today.  Keep up the good work overall with gym.  Continue to follow-up with Hematology.  DUE for FASTING BLOOD WORK (no food or drink after midnight before the lab appointment, only water or coffee without cream/sugar on the morning of)   SCHEDULE "Lab Only" visit in the morning at the clinic for lab draw in 3 MONTHS   - Make sure Lab Only appointment is at about 1 week before your next appointment, so that results will be available  For Lab Results, once available within 2-3 days of blood draw, you can can log in to MyChart online to view your results and a brief explanation. Also, we can discuss results at next follow-up visit.   Please schedule a Follow-up Appointment to: Return in about 3 months (around 10/24/2018) for Annual Physical.  If you have any other questions or concerns, please feel free to call the office or send a message through MyChart. You may also schedule an earlier appointment if necessary.  Additionally, you may be receiving a survey about your experience at our office within a few days to 1 week by e-mail or mail. We value your feedback.  [name], DO [facility], [place]

## 2018-07-25 NOTE — ED Provider Notes (Signed)
[facility] Emergency Department Provider Note   ____________________________________________   First MD Initiated Contact with Patient 07/25/18 2337     (approximate)  I have reviewed the triage vital signs and the nursing notes.   HISTORY  Chief Complaint Dizziness    HPI Jasmine Gordon is a 32 y.o. female who presents to the ED from home with a chief complaint of generalized weakness, lightheaded, shortness of breath and feeling cold.  Patient is due for an iron infusion tomorrow.  Saw her PCP today and had lab work done but will not get those results back for the next couple of days.  Had her IUD changed in mid August and wonders if it is in the right place or if she is pregnant causing the symptoms.  He denies fever, chills, chest pain, abdominal pain, nausea, vomiting, dysuria.  Has started working out in the gym 6 days/week.  Denies recent travel or trauma.   Past Medical History:  Diagnosis Date  . Allergy   . Anemia   . Asthma   . Migraine   . Panic attack   . PTSD (post-traumatic stress disorder) 2016    Patient Active Problem List   Diagnosis Date Noted  . Positive TB test 09/25/2017  . Allergic contact dermatitis due to metals 05/17/2017  . Pre-diabetes 03/06/2017  . Hyperlipidemia 03/06/2017  . Allergic reaction 02/08/2017  . Urticaria due to food allergy 02/08/2017  . Vitamin D deficiency 11/08/2016  . Iron deficiency anemia due to chronic blood loss 10/03/2016  . Menorrhagia with regular cycle 06/27/2016  . Mild persistent asthma 06/17/2016  . Anxiety and depression 06/17/2016  . Morbid obesity with BMI of 40.0-44.9, adult (HCC) 06/17/2016    Past Surgical History:  Procedure Laterality Date  . APPENDECTOMY    . Plantar warts      Prior to Admission medications   Medication Sig Start Date End Date Taking? Authorizing Provider  albuterol (PROVENTIL HFA;VENTOLIN HFA) 108 (90 Base) MCG/ACT inhaler Inhale 2 puffs into  the lungs every 6 (six) hours as needed for wheezing or shortness of breath. 06/17/16   Krebs, Laurel Dimmer, NP  albuterol (PROVENTIL) (2.5 MG/3ML) 0.083% nebulizer solution Take 3 mLs (2.5 mg total) by nebulization every 6 (six) hours as needed for wheezing or shortness of breath. 11/11/16   Karamalegos, Netta Neat, DO  EPINEPHrine 0.3 mg/0.3 mL IJ SOAJ injection INJECT INTRAMUSCULARLY AS DIRECTED 01/29/17   [provider]  famotidine (PEPCID) 40 MG tablet Take 1 tablet (40 mg total) by mouth every evening. 04/02/18 04/02/19  Myrna Blazer, MD  misoprostol (CYTOTEC) 100 MCG tablet Take 1 tablet (100 mcg total) by mouth once for 1 dose. 1 hour before appt 07/04/18 07/04/18  Copland, Ilona Sorrel, PA-C  misoprostol (CYTOTEC) 100 MCG tablet  07/04/18   [provider]  PARAGARD INTRAUTERINE COPPER IU by Intrauterine route. 11/26/15   [provider]    Allergies Reglan [metoclopramide]  Family History  Problem Relation Age of Onset  . Depression Mother   . Mental retardation Mother   . Bipolar disorder Mother   . Diabetes Father   . Stroke Maternal Grandfather   . Diabetes Maternal Grandfather     Social History Social History   Tobacco Use  . Smoking status: Never Smoker  . Smokeless tobacco: Never Used  Substance Use Topics  . Alcohol use: No  . Drug use: No    Review of Systems  Constitutional: Positive for generalized weakness.  No fever/chills Eyes: No visual changes. ENT: No sore throat. Cardiovascular: Denies chest pain. Respiratory: Positive for shortness of breath. Gastrointestinal: No abdominal pain.  No nausea, no vomiting.  No diarrhea.  No constipation. Genitourinary: Negative for dysuria. Musculoskeletal: Negative for back pain. Skin: Negative for rash. Neurological: Positive for dizziness.  Negative for headaches, focal weakness or numbness.   ____________________________________________   PHYSICAL EXAM:  VITAL SIGNS: ED  Triage Vitals  Enc Vitals Group     BP 07/25/18 2200 130/77     Pulse Rate 07/25/18 2200 90     Resp 07/25/18 2200 18     Temp 07/25/18 2200 98.9 F (37.2 C)     Temp Source 07/25/18 2200 Oral     SpO2 07/25/18 2200 100 %     Weight 07/25/18 2203 244 lb (110.7 kg)     Height 07/25/18 2203 5\' 5"  (1.651 m)     Head Circumference --      Peak Flow --      Pain Score 07/25/18 2203 3     Pain Loc --      Pain Edu? --      Excl. in GC? --     Constitutional: Alert and oriented. Well appearing and in no acute distress.  Texting on cell phone. Eyes: Conjunctivae are normal. PERRL. EOMI. Head: Atraumatic. Nose: No congestion/rhinnorhea. Mouth/Throat: Mucous membranes are moist.  Oropharynx non-erythematous. Neck: No stridor.  No carotid bruits. Cardiovascular: Normal rate, regular rhythm. Grossly normal heart sounds.  Good peripheral circulation. Respiratory: Normal respiratory effort.  No retractions. Lungs CTAB. Gastrointestinal: Soft and nontender to light or deep palpation. No distention. No abdominal bruits. No CVA tenderness. Musculoskeletal: No lower extremity tenderness nor edema.  No joint effusions. Neurologic:  Normal speech and language. No gross focal neurologic deficits are appreciated. No gait instability. Skin:  Skin is warm, dry and intact. No rash noted. Psychiatric: Mood and affect are normal. Speech and behavior are normal.  ____________________________________________   LABS (all labs ordered are listed, but only abnormal results are displayed)  Labs Reviewed  BASIC METABOLIC PANEL - Abnormal; Notable for the following components:      Result Value   Potassium 3.4 (*)    Glucose, Bld 103 (*)    All other components within normal limits  CBC - Abnormal; Notable for the following components:   Hemoglobin 11.5 (*)    RDW 15.2 (*)    All other components within normal limits  URINALYSIS, COMPLETE (UACMP) WITH MICROSCOPIC - Abnormal; Notable for the following  components:   Color, Urine YELLOW (*)    APPearance HAZY (*)    Specific Gravity, Urine 1.034 (*)    Hgb urine dipstick SMALL (*)    Ketones, ur 5 (*)    Protein, ur 30 (*)    Bacteria, UA RARE (*)    All other components within normal limits  FIBRIN DERIVATIVES D-DIMER ([facility] ONLY) - Abnormal; Notable for the following components:   Fibrin derivatives D-dimer ([facility]) 922.07 (*)    All other components within normal limits  TSH  T4, FREE  PREGNANCY, URINE  TROPONIN I   ____________________________________________  EKG  ED ECG REPORT I, Buffy Ehler J, the attending physician, personally viewed and interpreted this ECG.   Date: 07/25/2018  EKG Time: 2210  Rate: 79  Rhythm: normal EKG, normal sinus rhythm  Axis: Normal  Intervals:none  ST&T Change: Nonspecific  ____________________________________________  RADIOLOGY  ED MD interpretation: No acute cardiopulmonary process on chest x-ray;  IUD in good location on KUB; no PE on CTA  Official radiology report(s): Dg Chest 2 View  Result Date: 07/26/2018 CLINICAL DATA:  Dyspnea and dizziness EXAM: CHEST - 2 VIEW COMPARISON:  03/04/2018 FINDINGS: The heart size and mediastinal contours are within normal limits. Both lungs are clear. The visualized skeletal structures are unremarkable. IMPRESSION: No active cardiopulmonary disease. Electronically Signed   By: Tollie Eth M.D.   On: 07/26/2018 01:03   Dg Abdomen 1 View  Result Date: 07/26/2018 CLINICAL DATA:  Lower abdominal pain and cramping. EXAM: ABDOMEN - 1 VIEW COMPARISON:  None. FINDINGS: Increased stool retention within the colon. No bowel obstruction. IUD noted in the mid pelvis. Phleboliths are present bilaterally within the pelvis as well. No radio-opaque calculi or other significant radiographic abnormality are seen. No acute osseous abnormality. IMPRESSION: Increased stool burden within the colon. Electronically Signed   By: Tollie Eth M.D.   On: 07/26/2018 01:05   Ct Angio  Chest Pe W/cm &/or Wo Cm  Result Date: 07/26/2018 CLINICAL DATA:  Shortness of breath, lightheaded, and cold feeling for several days. Schedule for iron infusion tomorrow. Lower abdominal cramping. EXAM: CT ANGIOGRAPHY CHEST WITH CONTRAST TECHNIQUE: Multidetector CT imaging of the chest was performed using the standard protocol during bolus administration of intravenous contrast. Multiplanar CT image reconstructions and MIPs were obtained to evaluate the vascular anatomy. CONTRAST:  75mL OMNIPAQUE IOHEXOL 350 MG/ML SOLN COMPARISON:  08/01/2017 FINDINGS: Cardiovascular: Good opacification of the central and segmental pulmonary arteries. No focal filling defects. No evidence of significant pulmonary embolus. Normal caliber thoracic aorta. No evidence of aortic dissection. Normal heart size. No pericardial effusion. Mediastinum/Nodes: No significant lymphadenopathy. Esophagus is decompressed. Lungs/Pleura: Lungs are clear. No pleural effusion or pneumothorax. Upper Abdomen: No acute abnormality. Musculoskeletal: No chest wall abnormality. No acute or significant osseous findings. Review of the MIP images confirms the above findings. IMPRESSION: No evidence of significant pulmonary embolus. No evidence of active pulmonary disease. Electronically Signed   By: Burman Nieves M.D.   On: 07/26/2018 02:55    ____________________________________________   PROCEDURES  Procedure(s) performed: None  Procedures  Critical Care performed: No  ____________________________________________   INITIAL IMPRESSION / ASSESSMENT AND PLAN / ED COURSE  As part of my medical decision making, I reviewed the following data within the electronic MEDICAL RECORD NUMBER Nursing notes reviewed and incorporated, Labs reviewed, EKG interpreted, Old chart reviewed and Notes from prior ED visits   32 year old female who presents with multiple medical complaints including generalized weakness, dizziness and shortness of breath.  Also  wants pregnancy test.  Differential diagnosis includes but is not limited to anemia, dehydration, infection, metabolic, electrolyte abnormality, etc.  Laboratory and urinalysis so far indicate mild dehydration and hypokalemia.  Will check thyroid function, d-dimer, troponin and urine pregnancy.  Initiate IV fluid resuscitation.  Will obtain x-rays of chest for shortness of breath and KUB to evaluate IUD position.  Clinical Course as of Jul 27 323  Thu Jul 26, 2018  0205 D-dimer noted to be elevated.  Looks like it has been elevated previously as well.  However, given patient's symptoms tonight including shortness of breath, will obtain CT angiogram of the chest to evaluate for pulmonary embolus.   [JS]  1610 Updated patient on negative CT results.  She is resting no acute distress.  Strict return precautions given.  Patient verbalizes understanding and agrees with plan of care.   [JS]    Clinical Course User Index [JS] Irean Hong,  MD     ____________________________________________   FINAL CLINICAL IMPRESSION(S) / ED DIAGNOSES  Final diagnoses:  Dizziness  Dehydration  Hypokalemia  Constipation, unspecified constipation type     ED Discharge Orders    None       Note:  This document was prepared using Dragon voice recognition software and may include unintentional dictation errors.    Irean Hong, MD 07/26/18 [phone]

## 2018-07-25 NOTE — Progress Notes (Signed)
Subjective:    Patient ID: Jasmine Gordon, female    DOB: [birth year], 32 y.o.   MRN: [id number]  Jasmine Gordon is a 32 y.o. female presenting on 07/25/2018 for Fatigue (as per patient feels exhausted and light headache went to hematology yesterday and had Ferritin level checked)   HPI   Follow-up Fatigue / Iron Deficiency Anemia History of IDA in past. She was seen by Hematology [facility] in past 2018 received iron transfusion with improvement. She had similar concerns with this issue, and recently contacted [facility] again about further treatment / evaluation instead of waiting for apt today for labs to determine if anemic as she requested. Hematology drew labs for her and showed low ferritin level, see results below. She is still anemic with low Hgb. They arranged for her to see Dr Merlene Pulling and schedule IV iron treatment again. - Today no new concerns. Except persistent symptoms of anemia, some tired fatigue, she is trying to exercise more and some weight loss - Requesting vitamin testing, prior history of low vitamins. Last thyroid was normal. - Weight loss down 6 lbs in < 1 month, back at gym. - Also [facility] - changed IUD 07/05/18 - and she is concerned about pregnancy, she declines urine test today in office and request blood test with her labs to check for pregnancy Admits fatigue and tired Denies chest pain, dyspnea, dizziness, vertigo, weakness near syncope  PMH Morbid Obesity  Depression screen [facility] 2/9 07/14/2017 06/17/2016  Decreased Interest 0 1  Down, Depressed, Hopeless 0 1  PHQ - 2 Score 0 2  Altered sleeping 0 2  Tired, decreased energy 0 3  Change in appetite 0 1  Feeling bad or failure about yourself  0 1  Trouble concentrating 0 1  Moving slowly or fidgety/restless 0 0  Suicidal thoughts 0 0  PHQ-9 Score 0 10  Difficult doing work/chores Not difficult at all -    Social History   Tobacco Use  . Smoking status: Never Smoker  . Smokeless tobacco: Never Used    Substance Use Topics  . Alcohol use: No  . Drug use: No    Review of Systems Per HPI unless specifically indicated above     Objective:    BP 122/85   Pulse 91   Temp 98.6 F (37 C) (Oral)   Resp 16   Ht 5\' 5"  (1.651 m)   Wt 244 lb (110.7 kg)   BMI 40.60 kg/m   Wt Readings from Last 3 Encounters:  07/25/18 244 lb (110.7 kg)  07/05/18 250 lb (113.4 kg)  07/04/18 250 lb (113.4 kg)    Physical Exam  Constitutional: She is oriented to person, place, and time. She appears well-developed and well-nourished. No distress.  Well-appearing, comfortable, cooperative, obesity with some weight loss  HENT:  Head: Normocephalic and atraumatic.  Mouth/Throat: Oropharynx is clear and moist.  Eyes: Conjunctivae are normal. Right eye exhibits no discharge. Left eye exhibits no discharge.  Cardiovascular: Normal rate.  Pulmonary/Chest: Effort normal.  Musculoskeletal: She exhibits no edema.  Neurological: She is alert and oriented to person, place, and time.  Skin: Skin is warm and dry. No rash noted. She is not diaphoretic. No erythema.  Psychiatric: She has a normal mood and affect. Her behavior is normal.  Well groomed, good eye contact, normal speech and thoughts  Nursing note and vitals reviewed.  Results for orders placed or performed in visit on 07/20/18  Iron and TIBC  Result Value  Ref Range   Iron 67 28 - 170 ug/dL   TIBC 563 893 - 734 ug/dL   Saturation Ratios 21 10.4 - 31.8 %   UIBC 260 ug/dL  Ferritin  Result Value Ref Range   Ferritin 10 (L) 11 - 307 ng/mL  CBC with Differential  Result Value Ref Range   WBC 4.4 3.6 - 11.0 K/uL   RBC 3.96 3.80 - 5.20 MIL/uL   Hemoglobin 10.4 (L) 12.0 - 16.0 g/dL   HCT 28.7 (L) 68.1 - 15.7 %   MCV 81.9 80.0 - 100.0 fL   MCH 26.2 26.0 - 34.0 pg   MCHC 32.0 32.0 - 36.0 g/dL   RDW 26.2 (H) 03.5 - 59.7 %   Platelets 226 150 - 440 K/uL   Neutrophils Relative % 54 %   Neutro Abs 2.4 1.4 - 6.5 K/uL   Lymphocytes Relative 38 %    Lymphs Abs 1.6 1.0 - 3.6 K/uL   Monocytes Relative 6 %   Monocytes Absolute 0.3 0.2 - 0.9 K/uL   Eosinophils Relative 1 %   Eosinophils Absolute 0.1 0 - 0.7 K/uL   Basophils Relative 1 %   Basophils Absolute 0.0 0 - 0.1 K/uL      Assessment & Plan:   Problem List Items Addressed This Visit    Iron deficiency anemia due to chronic blood loss - Primary Subacute on chronic problem Agree with her return to [facility] Hematology since already established, prior IV iron in past, she will benefit from following up with already scheduled consult with Dr Merlene Pulling and IV iron treatment, they will monitor iron  Today not endorsing any new symptoms of acute anemia     Relevant Orders   Vitamin B12   TSH   Morbid obesity with BMI of 40.0-44.9, adult (HCC) Improved wt loss with inc gym and regular exercise Will revisit discussion in future after further lab review, will recheck thyroid now Follow-up progress on diet/lifestyle exercise wt loss    Relevant Orders   TSH   Vitamin D deficiency Prior low will re-check today   Relevant Orders   VITAMIN D 25 Hydroxy (Vit-D Deficiency, Fractures)    Other Visit Diagnoses    Vitamin B12 deficiency     Prior abnormal Will re-check level today    Relevant Orders   Vitamin B12   IUD (intrauterine device) in place     Recently replaced Will check Beta HCG serum per patient request, without other abnormal menstrual symptom currently, she declined urine preg test at this time. Will follow-up with [facility] after IUD change as planned.    Relevant Orders   B-HCG Quant      No orders of the defined types were placed in this encounter.   Follow up plan: Return in about 3 months (around 10/24/2018) for Annual Physical.  Future labs ordered for 10/15/18  [name], DO [facility] 07/25/2018, 6:34 PM

## 2018-07-25 NOTE — Telephone Encounter (Signed)
Clarified with patient, it was actually Hematology request for referral and she has already seen them. NOT Rheumatology.  [name], DO [facility] 07/25/2018, 11:21 AM

## 2018-07-25 NOTE — ED Notes (Signed)
p t reports feeling lightheaded and dizzy.  Pt had a new iud placed on august 15th and is concerned that she may be pregnant.  Hx of iron infusions and will see her doctor this week but is concerned she feels this way because her iron is low again.  No n/v/d  Pt reports intermittent sob and chest pain.  Pt states she feels anxious.  Pt alert  Speech clear.

## 2018-07-25 NOTE — ED Triage Notes (Signed)
Pt says that she has felt SOB, lightheaded, feels cold for several days. She saw her doctor today, but was not feeling as bad when she saw them, went to work and started feeling bad again. Scheduled for iron infusion tomorrow. She says she also has had lower abdominal cramping, has an IUD, but unsure if it is in the right place, so she is not sure if she is pregnant.

## 2018-07-26 ENCOUNTER — Other Ambulatory Visit: Payer: Self-pay

## 2018-07-26 ENCOUNTER — Other Ambulatory Visit: Payer: BLUE CROSS/BLUE SHIELD

## 2018-07-26 ENCOUNTER — Inpatient Hospital Stay: Payer: BLUE CROSS/BLUE SHIELD | Attending: Hematology and Oncology | Admitting: Hematology and Oncology

## 2018-07-26 ENCOUNTER — Other Ambulatory Visit: Payer: Self-pay | Admitting: Family Medicine

## 2018-07-26 ENCOUNTER — Emergency Department: Payer: BLUE CROSS/BLUE SHIELD

## 2018-07-26 ENCOUNTER — Ambulatory Visit: Payer: BLUE CROSS/BLUE SHIELD | Admitting: Obstetrics and Gynecology

## 2018-07-26 ENCOUNTER — Inpatient Hospital Stay: Payer: BLUE CROSS/BLUE SHIELD

## 2018-07-26 ENCOUNTER — Encounter: Payer: Self-pay | Admitting: Family Medicine

## 2018-07-26 VITALS — BP 124/74 | HR 114 | Temp 97.0°F | Resp 18 | Wt 247.5 lb

## 2018-07-26 DIAGNOSIS — E559 Vitamin D deficiency, unspecified: Secondary | ICD-10-CM

## 2018-07-26 DIAGNOSIS — D500 Iron deficiency anemia secondary to blood loss (chronic): Secondary | ICD-10-CM

## 2018-07-26 DIAGNOSIS — D509 Iron deficiency anemia, unspecified: Secondary | ICD-10-CM

## 2018-07-26 DIAGNOSIS — Z79899 Other long term (current) drug therapy: Secondary | ICD-10-CM

## 2018-07-26 DIAGNOSIS — N920 Excessive and frequent menstruation with regular cycle: Secondary | ICD-10-CM

## 2018-07-26 DIAGNOSIS — Z0000 Encounter for general adult medical examination without abnormal findings: Secondary | ICD-10-CM

## 2018-07-26 DIAGNOSIS — F32A Depression, unspecified: Secondary | ICD-10-CM

## 2018-07-26 DIAGNOSIS — F329 Major depressive disorder, single episode, unspecified: Secondary | ICD-10-CM

## 2018-07-26 DIAGNOSIS — E7800 Pure hypercholesterolemia, unspecified: Secondary | ICD-10-CM

## 2018-07-26 DIAGNOSIS — R7303 Prediabetes: Secondary | ICD-10-CM

## 2018-07-26 DIAGNOSIS — Z6841 Body Mass Index (BMI) 40.0 and over, adult: Secondary | ICD-10-CM

## 2018-07-26 DIAGNOSIS — F419 Anxiety disorder, unspecified: Secondary | ICD-10-CM

## 2018-07-26 LAB — VITAMIN D 25 HYDROXY (VIT D DEFICIENCY, FRACTURES): Vit D, 25-Hydroxy: 17 ng/mL — ABNORMAL LOW (ref 30–100)

## 2018-07-26 LAB — TROPONIN I: Troponin I: <0.03 ng/mL (ref <0.03)

## 2018-07-26 LAB — HCG, QUANTITATIVE, PREGNANCY: HCG, Total, QN: <2 m[IU]/mL

## 2018-07-26 LAB — T4, FREE: Free T4: 0.82 ng/dL (ref 0.82–1.77)

## 2018-07-26 LAB — TSH
TSH: 1.7 mIU/L
TSH: 3.813 u[IU]/mL (ref 0.350–4.500)

## 2018-07-26 LAB — FIBRIN DERIVATIVES D-DIMER (ARMC ONLY): Fibrin derivatives D-dimer (ARMC): 922.07 ng/mL (FEU) — ABNORMAL HIGH (ref 0.00–499.00)

## 2018-07-26 LAB — VITAMIN B12: Vitamin B-12: 382 pg/mL (ref 200–1100)

## 2018-07-26 MED ORDER — LACTULOSE 10 GM/15ML PO SOLN: 20.0000 g | Freq: Every day | ORAL | 0 refills | Status: DC | PRN

## 2018-07-26 MED ORDER — SODIUM CHLORIDE 0.9 % IV SOLN: 200.0000 mg | Freq: Once | INTRAVENOUS | Status: DC

## 2018-07-26 MED ORDER — IOHEXOL 350 MG/ML SOLN
75.0000 mL | Freq: Once | INTRAVENOUS | Status: AC | PRN
  Administered 2018-07-26: 75 mL via INTRAVENOUS

## 2018-07-26 MED ORDER — IRON SUCROSE 20 MG/ML IV SOLN
200.0000 mg | Freq: Once | INTRAVENOUS | Status: AC
  Administered 2018-07-26: 200 mg via INTRAVENOUS
  Filled 2018-07-26: qty 10

## 2018-07-26 MED ORDER — SODIUM CHLORIDE 0.9 % IV SOLN
Freq: Once | INTRAVENOUS | Status: AC
  Administered 2018-07-26: 12:00:00 via INTRAVENOUS
  Filled 2018-07-26: qty 250

## 2018-07-26 NOTE — ED Notes (Signed)
Pt in no distress at this time. Pt is laying in bed on phone with fluids infusing.

## 2018-07-26 NOTE — Progress Notes (Signed)
Here for follow up  Per pt went to ER last eve after 9 pm for " heart racing , light headedness, and pregnancy symptoms " per pt given ivf ,and potassium and not sure if she felt better.  Pulse ox this am 98%on RA.. Pulse this am 106,and 114   Manually p -100 this am. Still feeling very tired she stated.

## 2018-07-26 NOTE — Progress Notes (Signed)
[facility] [place] day:  07/26/2018  Chief Complaint: Jasmine Gordon is a 32 y.o. female with iron deficiency anemia who is seen for 15 month reassessment and re-initiation of IV iron.  HPI:  The patient was last seen in the hematology clinic on 04/21/2017.  At that time, she remains fatigued.  Exam was unremarkable.  Hematocrit had dropped from 35.6 to 31.8 despite oral iron.  Guaiac cards were provided.  Decision was made to switch from oral to IV iron.  She received Venofer weekly x 3 (04/27/2017 - 05/10/2017).  CBC on 09/06/2017 revealed a hematocrit of 35.8, hemoglobin 11.9 and MCV 81.6.  She was to be seen for follow-up visit in 4 months.  She was lost to follow-up.  She contacted the clinic on 07/20/2018 stating the she thought her iron was low and she needed an infusion.  CBC on 07/20/2018 revealed a hematocrit of 32.4, hemoglobin 10.4, and MCV 81.9.  Ferritin was 10 with an iron saturation of 21% and a TIBC of 326.  She was seen in the [facility] ER on 07/25/2018 with weakness, lightheadedness, shortness of breath and feeling cold.  IUD was changed in mid August.  Pregnancy test was negative.  TSH and free T4 were normal.  Urine revealed a small amount of hemoglobin and 0-5 RBC/HPF.  D-dimers were 922.07.  CBC revealed a hematocrit of 35.0, hemoglobin 11.5, and MCV 81.7.  Chest CT angiogram on 07/26/2018 revealed no evidence of pulmonary embolism.  There was no evidence of active pulmonary disease.  Symptomatically, she feels very tired.  She notes that she was dehydrated in the ER.  Menses is "more regulated'.  However, she notes recent menses is "more heavy".  She recently started going to the gym 5 days/week.   Past Medical History:  Diagnosis Date  . Allergy   . Anemia   . Asthma   . Migraine   . Panic attack   . PTSD (post-traumatic stress disorder) 2016    Past Surgical History:  Procedure Laterality Date  . APPENDECTOMY    .  Plantar warts      Family History  Problem Relation Age of Onset  . Depression Mother   . Mental retardation Mother   . Bipolar disorder Mother   . Diabetes Father   . Stroke Maternal Grandfather   . Diabetes Maternal Grandfather     Social History:  reports that she has never smoked. She has never used smokeless tobacco. She reports that she does not drink alcohol or use drugs.  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  She is off 3 days/week.  She has 2 children with ADHD. She is going to the gym 5 days/week.  The patient is alone today.  Allergies:  Allergies  Allergen Reactions  . Reglan [Metoclopramide] Hives and Shortness Of Breath    Current Medications: Current Outpatient Medications  Medication Sig Dispense Refill  . albuterol (PROVENTIL HFA;VENTOLIN HFA) 108 (90 Base) MCG/ACT inhaler Inhale 2 puffs into the lungs every 6 (six) hours as needed for wheezing or shortness of breath. (Patient not taking: Reported on 07/26/2018) 1 Inhaler 11  . albuterol (PROVENTIL) (2.5 MG/3ML) 0.083% nebulizer solution Take 3 mLs (2.5 mg total) by nebulization every 6 (six) hours as needed for wheezing or shortness of breath. (Patient not taking: Reported on 07/26/2018) 150 mL 2  .  EPINEPHrine 0.3 mg/0.3 mL IJ SOAJ injection INJECT INTRAMUSCULARLY AS DIRECTED  0  . famotidine (PEPCID) 40 MG tablet Take 1 tablet (40 mg total) by mouth every evening. (Patient not taking: Reported on 07/26/2018) 30 tablet 0  . lactulose (CHRONULAC) 10 GM/15ML solution Take 30 mLs (20 g total) by mouth daily as needed for mild constipation. (Patient not taking: Reported on 07/26/2018) 120 mL 0  . misoprostol (CYTOTEC) 100 MCG tablet Take 1 tablet (100 mcg total) by mouth once for 1 dose. 1 hour before appt 1 tablet 0  . misoprostol (CYTOTEC) 100 MCG tablet   0  . PARAGARD INTRAUTERINE COPPER IU by Intrauterine route.      Current Facility-Administered Medications  Medication Dose Route Frequency Provider Last Rate Last Dose  . PARAGARD INTRAUTERINE COPPER IUD   Intrauterine Once Copland, Alicia B, PA-C        Review of Systems:  GENERAL:  Feels weak.  No fevers, sweats.  Weight down 6 pounds since 2018. PERFORMANCE STATUS (ECOG):  1 HEENT:  No visual changes, runny nose, sore throat, mouth sores or tenderness. Lungs: No shortness of breath or cough.  No hemoptysis. Cardiac:  No chest pain, palpitations, orthopnea, or PND. GI:  No nausea, vomiting, diarrhea, constipation, melena or hematochezia. GU:  No urgency, frequency, dysuria, or hematuria.  Menses fluctuating (recent heavy). Musculoskeletal:  No back pain.  No joint pain.  No muscle tenderness. Extremities:  No pain or swelling. Skin:  No rashes or skin changes. Neuro:  No headache, numbness or weakness, balance or coordination issues. Endocrine:  No diabetes, thyroid issues, hot flashes or night sweats. Psych:  No mood changes, depression or anxiety. Pain:  No focal pain. Review of systems:  All other systems reviewed and found to be negative.   Physical Exam: Blood pressure 124/74, pulse (!) 114, temperature (!) 97 F (36.1 C), temperature source Tympanic, resp. rate 18, weight 247 lb 8 oz (112.3 kg). GENERAL:  Well developed, well nourished, woman sitting comfortably in the exam room in no acute distress. MENTAL STATUS:  Alert and oriented to person, place and time. HEAD:  Long hair.  Normocephalic, atraumatic, face symmetric, no Cushingoid features. EYES:  Brown eyes.  Pupils equal round and reactive to light and accomodation.  No conjunctivitis or scleral icterus. ENT:  Oropharynx clear without lesion.  Tongue normal. Mucous membranes moist.  RESPIRATORY:  Clear to auscultation without rales, wheezes or rhonchi. CARDIOVASCULAR:  Regular rate and rhythm without murmur, rub or gallop. ABDOMEN:  Soft, non-tender, with active bowel sounds,  and no appreciable hepatosplenomegaly.  No masses. SKIN:  No rashes, ulcers or lesions. EXTREMITIES: No edema, no skin discoloration or tenderness.  No palpable cords. LYMPH NODES: No palpable cervical, supraclavicular, axillary or inguinal adenopathy  NEUROLOGICAL: Unremarkable. PSYCH:  Appropriate.    Admission on 07/25/2018, Discharged on 07/26/2018  Component Date Value Ref Range Status  . Sodium 07/25/2018 139  135 - 145 mmol/L Final  . Potassium 07/25/2018 3.4* 3.5 - 5.1 mmol/L Final  . Chloride 07/25/2018 107  98 - 111 mmol/L Final  . CO2 07/25/2018 24  22 - 32 mmol/L Final  . Glucose, Bld 07/25/2018 103* 70 - 99 mg/dL Final  . BUN 07/25/2018 15  6 - 20 mg/dL Final  . Creatinine, Ser 07/25/2018 0.72  0.44 - 1.00 mg/dL Final  . Calcium 07/25/2018 9.2  8.9 - 10.3 mg/dL Final  . GFR calc non Af Amer 07/25/2018 >60  >60 mL/min Final  . GFR  calc Af Amer 07/25/2018 >60  >60 mL/min Final   Comment: (NOTE) The eGFR has been calculated using the CKD EPI equation. This calculation has not been validated in all clinical situations. eGFR's persistently <60 mL/min signify possible Chronic Kidney Disease.   Georgiann Hahn gap 07/25/2018 8  5 - 15 Final   Performed at [facility], [address] [place], [place] [address]  . WBC 07/25/2018 5.3  3.6 - 11.0 K/uL Final  . RBC 07/25/2018 4.28  3.80 - 5.20 MIL/uL Final  . Hemoglobin 07/25/2018 11.5* 12.0 - 16.0 g/dL Final  . HCT 07/25/2018 35.0  35.0 - 47.0 % Final  . MCV 07/25/2018 81.7  80.0 - 100.0 fL Final  . MCH 07/25/2018 26.7  26.0 - 34.0 pg Final  . MCHC 07/25/2018 32.7  32.0 - 36.0 g/dL Final  . RDW 07/25/2018 15.2* 11.5 - 14.5 % Final  . Platelets 07/25/2018 220  150 - 440 K/uL Final   Performed at [facility], [address], [place], [place] [address]  . Color, Urine 07/25/2018 YELLOW* YELLOW Final  . APPearance 07/25/2018 HAZY* CLEAR Final  . Specific Gravity, Urine 07/25/2018 1.034* 1.005 - 1.030 Final  . pH  07/25/2018 5.0  5.0 - 8.0 Final  . Glucose, UA 07/25/2018 NEGATIVE  NEGATIVE mg/dL Final  . Hgb urine dipstick 07/25/2018 SMALL* NEGATIVE Final  . Bilirubin Urine 07/25/2018 NEGATIVE  NEGATIVE Final  . Ketones, ur 07/25/2018 5* NEGATIVE mg/dL Final  . Protein, ur 07/25/2018 30* NEGATIVE mg/dL Final  . Nitrite 07/25/2018 NEGATIVE  NEGATIVE Final  . Leukocytes, UA 07/25/2018 NEGATIVE  NEGATIVE Final  . RBC / HPF 07/25/2018 0-5  0 - 5 RBC/hpf Final  . WBC, UA 07/25/2018 0-5  0 - 5 WBC/hpf Final  . Bacteria, UA 07/25/2018 RARE* NONE SEEN Final  . Squamous Epithelial / LPF 07/25/2018 21-50  0 - 5 Final  . Mucus 07/25/2018 PRESENT   Final   Performed at [facility], [address], [place], [place] [address]  . TSH 07/25/2018 3.813  0.350 - 4.500 uIU/mL Final   Comment: Performed by a 3rd Generation assay with a functional sensitivity of <=0.01 uIU/mL. Performed at [facility], [address], [place], [place] [address]   . Free T4 07/25/2018 0.82  0.82 - 1.77 ng/dL Final   Comment: (NOTE) Biotin ingestion may interfere with free T4 tests. If the results are inconsistent with the TSH level, previous test results, or the clinical presentation, then consider biotin interference. If needed, order repeat testing after stopping biotin. Performed at [facility], [address], [place], [place] [address]   . Preg Test, Ur 07/25/2018 NEGATIVE  NEGATIVE Final   Performed at [facility], [address] [place], [place] [address]  . Troponin I 07/25/2018 <0.03  <0.03 ng/mL Final   Performed at [facility], [address] [place],  [address]  . Fibrin derivatives D-dimer ([facility]) 07/26/2018 922.07* 0.00 - 499.00 ng/mL (FEU) Final   Comment: (NOTE) <> Exclusion of Venous Thromboembolism (VTE) - OUTPATIENT ONLY   (Emergency Department or [place])   0-499 ng/ml (FEU): With a low to intermediate pretest probability                      for  VTE this test result excludes the diagnosis                      of VTE.   >  499 ng/ml (FEU) : VTE not excluded; additional work up for VTE is                      required. <> Testing on Inpatients and Evaluation of Disseminated Intravascular   Coagulation (DIC) Reference Range:   0-499 ng/ml (FEU) Performed at [facility], [address], [place], [place] [address]   Office Visit on 07/25/2018  Component Date Value Ref Range Status  . Vitamin B-12 07/25/2018 382  200 - 1,100 pg/mL Final   Comment: . Please Note: Although the reference range for vitamin B12 is [phone] pg/mL, it has been reported that between 5 and 10% of patients with values between 200 and 400 pg/mL may experience neuropsychiatric and hematologic abnormalities due to occult B12 deficiency; less than 1% of patients with values above 400 pg/mL will have symptoms. .   . Vit D, 25-Hydroxy 07/25/2018 17* 30 - 100 ng/mL Final   Comment: Vitamin D Status         25-OH Vitamin D: . Deficiency:                    <20 ng/mL Insufficiency:             20 - 29 ng/mL Optimal:                 > or = 30 ng/mL . For 25-OH Vitamin D testing on patients on  D2-supplementation and patients for whom quantitation  of D2 and D3 fractions is required, the QuestAssureD(TM) 25-OH VIT D, (D2,D3), LC/MS/MS is recommended: order  code [id number] (patients >96yr). . For more information on this test, go to: http://education.questdiagnostics.com/faq/FAQ163 (This link is being provided for  informational/educational purposes only.)   . TSH 07/25/2018 1.70  mIU/L Final   Comment:           Reference Range .           > or = 20 Years  0.40-4.50 .                Pregnancy Ranges           First trimester    0.26-2.66           Second trimester   0.55-2.73           Third trimester    0.43-2.91   . HCG, Total, QN 07/25/2018 <2  mIU/mL Final   Comment: Gestational Age   Expected hCG values (mIU/mL) <1 Week:                  5-50 1-2 Weeks:               50-500 2-3 Weeks:               [phone] 3-4 Weeks:               500-10000 4-5 Weeks:               1000-50000 5-6 Weeks:               10000-100000 6-8 Weeks:               15000-200000 2-3 Months:              10000-100000 The table above provides only a very rough estimate of gestational age and should be used only in conjunction with other methods for establishing gestational age. Much more reliable and accurate  estimations of gestational age may be obtained by using LMP or ultrasound. . . Values from different assay methods may vary. The use of this assay to monitor or to diagnose  patients with cancer or any condition unrelated to pregnancy has not been cleared or approved by the FDA or the manufacturer of the assay.     Assessment:  Jasmine Gordon is a 32 y.o. female with iron deficiency anemia likely due to heavy menses.  Menses improved after IUD placement.  Diet appears good.  She denies any melena, hematochezia, or hematuria.  Urinalysis reveals no blood.  Work-up on 10/03/2016 revealed a hematocrit 34.2, hemoglobin 11.1, MCV 79.5, platelets 196,000, white count 4700 with an ANC of 2800.  Creatinine was 0.81.  Liver function tests were normal.  Ferritin was 12.  Iron studies included a saturation of 12% and TIBC of 340.  Urinalysis revealed no RBCs on 08/25/2016.  She was on oral iron for several months. Despite oral iron, hemoglobin dropped.   She received Venofer weekly x 3 (04/27/2017 - 05/10/2017).   Ferritin has been followed: 26 on 06/17/2016, 12 on 10/03/2016, 7 on 04/21/2017, 63 on 06/22/2017, and 10 on 07/20/2018.  Symptomatically, she is fatigued.  Recent menses was heavy.  Hemoglobin was 11.5 on 07/25/2018.  Plan: 1.  Review ER evaluation and labs. 2.  Iron deficiency anemia:  Venofer today.  RTC in 1 week for Venofer. 3.  RTC in 3 months for labs (CBC with diff, ferritin). 4.  RTC in 6 months for MD assessment, labs  (CBVC with diff, ferritin- day before), and +/- Venofer.   Lequita Asal, MD  07/26/2018, 12:02 PM

## 2018-07-26 NOTE — ED Notes (Signed)
Report off to sherie rn 

## 2018-07-26 NOTE — Discharge Instructions (Addendum)
Drink plenty of fluids daily.  You may take lactulose as needed for bowel movements.  Return to the ER for worsening symptoms, persistent vomiting, difficulty breathing or other concerns.

## 2018-07-30 ENCOUNTER — Other Ambulatory Visit: Payer: Self-pay | Admitting: Obstetrics and Gynecology

## 2018-07-30 DIAGNOSIS — T8339XA Other mechanical complication of intrauterine contraceptive device, initial encounter: Secondary | ICD-10-CM

## 2018-07-31 ENCOUNTER — Ambulatory Visit (INDEPENDENT_AMBULATORY_CARE_PROVIDER_SITE_OTHER): Payer: BLUE CROSS/BLUE SHIELD | Admitting: Obstetrics and Gynecology

## 2018-07-31 ENCOUNTER — Ambulatory Visit (INDEPENDENT_AMBULATORY_CARE_PROVIDER_SITE_OTHER): Payer: BLUE CROSS/BLUE SHIELD

## 2018-07-31 ENCOUNTER — Encounter: Payer: Self-pay | Admitting: Obstetrics and Gynecology

## 2018-07-31 VITALS — BP 118/70 | HR 79 | Ht 66.0 in | Wt 246.0 lb

## 2018-07-31 DIAGNOSIS — T8339XA Other mechanical complication of intrauterine contraceptive device, initial encounter: Secondary | ICD-10-CM

## 2018-07-31 DIAGNOSIS — Z30431 Encounter for routine checking of intrauterine contraceptive device: Secondary | ICD-10-CM | POA: Diagnosis not present

## 2018-07-31 NOTE — Patient Instructions (Signed)
I value your feedback and entrusting us with your care. If you get a [facility] patient survey, I would appreciate you taking the time to let us know about your experience today. Thank you! 

## 2018-07-31 NOTE — Progress Notes (Addendum)
Jasmine Cords, DO   Chief Complaint  Patient presents with  . Follow-up    u/s today    HPI:      [name] is a 32 y.o. [phone] who LMP was No LMP recorded. (Menstrual status: IUD)., presents today for IUD f/u of Paragard placed 1 mo ago.  Pt went to ED 9/19 for dizziness and dehydration. Xray showed IUD in place but it looks upside down on plain film, so doing GYN u/s for placement today. Pt had paragard replaced 07/05/18 due to malposition of previous IUD. Pt can feel strings, no pelvic pain. No heavy d/c, dysmen. Had light spotting for a few days. No menses yet. Has not been sex active since replacement. Doing fine.   Past Medical History:  Diagnosis Date  . Allergy   . Anemia   . Asthma   . Migraine   . Panic attack   . PTSD (post-traumatic stress disorder) 2016    Past Surgical History:  Procedure Laterality Date  . APPENDECTOMY    . Plantar warts      Family History  Problem Relation Age of Onset  . Depression Mother   . Mental retardation Mother   . Bipolar disorder Mother   . Diabetes Father   . Stroke Maternal Grandfather   . Diabetes Maternal Grandfather     Social History   Socioeconomic History  . Marital status: Married    Spouse name: Not on file  . Number of children: Not on file  . Years of education: Not on file  . Highest education level: Not on file  Occupational History  . Not on file  Social Needs  . Financial resource strain: Not on file  . Food insecurity:    Worry: Not on file    Inability: Not on file  . Transportation needs:    Medical: Not on file    Non-medical: Not on file  Tobacco Use  . Smoking status: Never Smoker  . Smokeless tobacco: Never Used  Substance and Sexual Activity  . Alcohol use: No  . Drug use: No  . Sexual activity: Yes    Birth control/protection: IUD    Comment: Paragard  Lifestyle  . Physical activity:    Days per week: Not on file    Minutes per session: Not on file   . Stress: Not on file  Relationships  . Social connections:    Talks on phone: Not on file    Gets together: Not on file    Attends religious service: Not on file    Active member of club or organization: Not on file    Attends meetings of clubs or organizations: Not on file    Relationship status: Not on file  . Intimate partner violence:    Fear of current or ex partner: Not on file    Emotionally abused: Not on file    Physically abused: Not on file    Forced sexual activity: Not on file  Other Topics Concern  . Not on file  Social History Narrative  . Not on file    Outpatient Medications Prior to Visit  Medication Sig Dispense Refill  . albuterol (PROVENTIL HFA;VENTOLIN HFA) 108 (90 Base) MCG/ACT inhaler Inhale 2 puffs into the lungs every 6 (six) hours as needed for wheezing or shortness of breath. 1 Inhaler 11  . albuterol (PROVENTIL) (2.5 MG/3ML) 0.083% nebulizer solution Take 3 mLs (2.5 mg total) by nebulization every 6 (six)  hours as needed for wheezing or shortness of breath. 150 mL 2  . EPINEPHrine 0.3 mg/0.3 mL IJ SOAJ injection INJECT INTRAMUSCULARLY AS DIRECTED  0  . misoprostol (CYTOTEC) 100 MCG tablet   0  . PARAGARD INTRAUTERINE COPPER IU by Intrauterine route.    . famotidine (PEPCID) 40 MG tablet Take 1 tablet (40 mg total) by mouth every evening. (Patient not taking: Reported on 07/26/2018) 30 tablet 0  . lactulose (CHRONULAC) 10 GM/15ML solution Take 30 mLs (20 g total) by mouth daily as needed for mild constipation. (Patient not taking: Reported on 07/26/2018) 120 mL 0  . misoprostol (CYTOTEC) 100 MCG tablet Take 1 tablet (100 mcg total) by mouth once for 1 dose. 1 hour before appt 1 tablet 0   Facility-Administered Medications Prior to Visit  Medication Dose Route Frequency Provider Last Rate Last Dose  . PARAGARD INTRAUTERINE COPPER IUD   Intrauterine Once Copland, Alicia B, PA-C          ROS:  Review of Systems  Constitutional: Negative for fever.    Gastrointestinal: Negative for blood in stool, constipation, diarrhea, nausea and vomiting.  Genitourinary: Negative for dyspareunia, dysuria, flank pain, frequency, hematuria, urgency, vaginal bleeding, vaginal discharge and vaginal pain.  Musculoskeletal: Negative for back pain.  Skin: Negative for rash.    OBJECTIVE:   Vitals:  BP 118/70   Pulse 79   Ht 5\' 6"  (1.676 m)   Wt 246 lb (111.6 kg)   BMI 39.71 kg/m   Physical Exam  Constitutional: She is oriented to person, place, and time. She appears well-developed.  Neck: Normal range of motion.  Pulmonary/Chest: Effort normal.  Musculoskeletal: Normal range of motion.  Neurological: She is alert and oriented to person, place, and time. No cranial nerve deficit.  Psychiatric: She has a normal mood and affect. Her behavior is normal. Judgment and thought content normal.  Vitals reviewed.   Results:  ULTRASOUND REPORT  Location: [facility]  Date of Service: 07/31/2018    Indications:IUD complication Findings:  The uterus is anteverted and measures 8.71x6.93x4.68cm. Echo texture is homogenous without evidence of focal masses. The Endometrium measures 9.64 mm.  IUD in its proper position  Right Ovary measures 3.33 x 2.71 x 2.38 cm. It is normal in appearance. Left Ovary measures 2.35 x 2.44 x 1.38 cm. It is normal in appearance. Survey of the adnexa demonstrates no adnexal masses. There is  free fluid in the posterior cul de sac. = 0.71 x 1.3cm  Impression: 1.IUD in its proper position 2.There is  free fluid in the posterior cul de sac. = 0.71x1.3cm  Abeer Alsammarraie,RDMS  The ultrasound images and findings were reviewed by me and I agree with the above report.  Thomasene Mohair, MD, [facility], [facility] 07/31/2018 1:20 PM      Assessment/Plan: Encounter for routine checking of intrauterine contraceptive device (IUD) - IUD in correct location. Pt doing well. F/u prn.       Return if symptoms worsen or fail to improve.  Alicia B. Copland, PA-C 07/31/2018 10:47 AM

## 2018-08-02 ENCOUNTER — Other Ambulatory Visit: Payer: Self-pay | Admitting: Urgent Care

## 2018-08-02 ENCOUNTER — Inpatient Hospital Stay: Payer: BLUE CROSS/BLUE SHIELD

## 2018-08-02 VITALS — BP 119/78 | HR 80 | Resp 18

## 2018-08-02 DIAGNOSIS — D500 Iron deficiency anemia secondary to blood loss (chronic): Secondary | ICD-10-CM

## 2018-08-02 DIAGNOSIS — D509 Iron deficiency anemia, unspecified: Secondary | ICD-10-CM | POA: Diagnosis not present

## 2018-08-02 MED ORDER — SODIUM CHLORIDE 0.9 % IV SOLN: 200.0000 mg | Freq: Once | INTRAVENOUS | Status: DC

## 2018-08-02 MED ORDER — SODIUM CHLORIDE 0.9 % IV SOLN
INTRAVENOUS | Status: DC
  Administered 2018-08-02: 14:00:00 via INTRAVENOUS
  Filled 2018-08-02: qty 250

## 2018-08-02 MED ORDER — IRON SUCROSE 20 MG/ML IV SOLN
200.0000 mg | Freq: Once | INTRAVENOUS | Status: AC
  Administered 2018-08-02: 200 mg via INTRAVENOUS
  Filled 2018-08-02: qty 10

## 2018-08-04 ENCOUNTER — Encounter: Payer: Self-pay | Admitting: Hematology and Oncology

## 2018-08-15 ENCOUNTER — Encounter: Payer: Self-pay | Admitting: Emergency Medicine

## 2018-08-15 ENCOUNTER — Emergency Department
Admission: EM | Admit: 2018-08-15 | Discharge: 2018-08-15 | Disposition: A | Discharge status: Home / Self Care | Payer: BLUE CROSS/BLUE SHIELD | Attending: Emergency Medicine | Admitting: Emergency Medicine

## 2018-08-15 DIAGNOSIS — J45909 Unspecified asthma, uncomplicated: Secondary | ICD-10-CM | POA: Diagnosis not present

## 2018-08-15 DIAGNOSIS — M25561 Pain in right knee: Secondary | ICD-10-CM | POA: Diagnosis present

## 2018-08-15 DIAGNOSIS — Z79899 Other long term (current) drug therapy: Secondary | ICD-10-CM | POA: Diagnosis not present

## 2018-08-15 DIAGNOSIS — Z711 Person with feared health complaint in whom no diagnosis is made: Secondary | ICD-10-CM | POA: Diagnosis not present

## 2018-08-15 NOTE — ED Triage Notes (Addendum)
Patient reports feeling feverish and occasional headache and having some knee pain.  Patient states she got bit by a mosquito yesterday evening.  Patient states, "I've been hearing stuff about mosquitos and it made me nervous when I started maybe having some symptoms."  Patient states, "Okay, I should have never looked at this darn thing on the computer."  Patient is concerned about EEV.

## 2018-08-15 NOTE — ED Provider Notes (Signed)
[facility] Emergency Department Provider Note  ____________________________________________  Time seen: Approximately 9:51 AM  I have reviewed the triage vital signs and the nursing notes.   HISTORY  Chief Complaint Insect Bite    HPI Jasmine Gordon is a 32 y.o. female that's presents emergency department for concern of mosquito bite.  Patient states that she felt like an insect bit the right side of her neck last night.  She did not see an insect or mosquito.  Her neighbor was then telling her about [place] equine encephalitis and she became concerned that she may get this.   She woke up this morning feeling "tired and off" and noticed some right knee pain.  She states that she has anxiety and this is likely contributing.  Both children have a URI and she started sneezing this morning.  No tick bites.  She denies headache, visual changes, dizziness, weakness, shortness of breath, chest pain.  Past Medical History:  Diagnosis Date  . Allergy   . Anemia   . Asthma   . Migraine   . Panic attack   . PTSD (post-traumatic stress disorder) 2016    Patient Active Problem List   Diagnosis Date Noted  . Positive TB test 09/25/2017  . Allergic contact dermatitis due to metals 05/17/2017  . Pre-diabetes 03/06/2017  . Hyperlipidemia 03/06/2017  . Allergic reaction 02/08/2017  . Urticaria due to food allergy 02/08/2017  . Vitamin D deficiency 11/08/2016  . Iron deficiency anemia due to chronic blood loss 10/03/2016  . Menorrhagia with regular cycle 06/27/2016  . Mild persistent asthma 06/17/2016  . Anxiety and depression 06/17/2016  . Morbid obesity with BMI of 40.0-44.9, adult (HCC) 06/17/2016    Past Surgical History:  Procedure Laterality Date  . APPENDECTOMY    . Plantar warts      Prior to Admission medications   Medication Sig Start Date End Date Taking? Authorizing Provider  albuterol (PROVENTIL HFA;VENTOLIN HFA) 108 (90 Base) MCG/ACT  inhaler Inhale 2 puffs into the lungs every 6 (six) hours as needed for wheezing or shortness of breath. 06/17/16   Krebs, Laurel Dimmer, NP  albuterol (PROVENTIL) (2.5 MG/3ML) 0.083% nebulizer solution Take 3 mLs (2.5 mg total) by nebulization every 6 (six) hours as needed for wheezing or shortness of breath. 11/11/16   Karamalegos, Netta Neat, DO  EPINEPHrine 0.3 mg/0.3 mL IJ SOAJ injection INJECT INTRAMUSCULARLY AS DIRECTED 01/29/17   [provider]  famotidine (PEPCID) 40 MG tablet Take 1 tablet (40 mg total) by mouth every evening. Patient not taking: Reported on 07/26/2018 04/02/18 04/02/19  Myrna Blazer, MD  lactulose (CHRONULAC) 10 GM/15ML solution Take 30 mLs (20 g total) by mouth daily as needed for mild constipation. Patient not taking: Reported on 07/26/2018 07/26/18   Irean Hong, MD  misoprostol (CYTOTEC) 100 MCG tablet Take 1 tablet (100 mcg total) by mouth once for 1 dose. 1 hour before appt 07/04/18 07/04/18  Copland, Ilona Sorrel, PA-C  misoprostol (CYTOTEC) 100 MCG tablet  07/04/18   [provider]  PARAGARD INTRAUTERINE COPPER IU by Intrauterine route. 11/26/15   [provider]    Allergies Reglan [metoclopramide]  Family History  Problem Relation Age of Onset  . Depression Mother   . Mental retardation Mother   . Bipolar disorder Mother   . Diabetes Father   . Stroke Maternal Grandfather   . Diabetes Maternal Grandfather     Social History Social History   Tobacco Use  .  Smoking status: Never Smoker  . Smokeless tobacco: Never Used  Substance Use Topics  . Alcohol use: No  . Drug use: No     Review of Systems  Constitutional: No fever/chills ENT: No upper respiratory complaints. Cardiovascular: No chest pain. Respiratory: No cough. No SOB. Gastrointestinal: No abdominal pain.  No nausea, no vomiting.  Musculoskeletal: Negative for musculoskeletal pain. Skin: Negative for rash, abrasions, lacerations, ecchymosis. Neurological:  Negative for headaches, numbness or tingling   ____________________________________________   PHYSICAL EXAM:  VITAL SIGNS: ED Triage Vitals  Enc Vitals Group     BP 08/15/18 0925 128/82     Pulse Rate 08/15/18 0925 (!) 104     Resp 08/15/18 0925 18     Temp 08/15/18 0925 98.7 F (37.1 C)     Temp Source 08/15/18 0925 Oral     SpO2 08/15/18 0925 99 %     Weight --      Height 08/15/18 0926 5\' 6"  (1.676 m)     Head Circumference --      Peak Flow --      Pain Score 08/15/18 0926 0     Pain Loc --      Pain Edu? --      Excl. in GC? --      Constitutional: Alert and oriented. Well appearing and in no acute distress. Eyes: Conjunctivae are normal. PERRL. EOMI. Head: Atraumatic. ENT:      Ears:      Nose: No congestion/rhinnorhea.      Mouth/Throat: Mucous membranes are moist.  Neck: No stridor.  Cardiovascular: Normal rate, regular rhythm.  Good peripheral circulation. Respiratory: Normal respiratory effort without tachypnea or retractions. Lungs CTAB. Good air entry to the bases with no decreased or absent breath sounds. Gastrointestinal: Bowel sounds 4 quadrants. Soft and nontender to palpation. No guarding or rigidity. No palpable masses. No distention.  Musculoskeletal: Full range of motion to all extremities. No gross deformities appreciated. Neurologic:  Normal speech and language. No gross focal neurologic deficits are appreciated.  Skin:  Skin is warm, dry and intact. No rash noted.  No visible insect bite. Psychiatric: Mood and affect are normal. Speech and behavior are normal. Patient exhibits appropriate insight and judgement.   ____________________________________________   LABS (all labs ordered are listed, but only abnormal results are displayed)  Labs Reviewed - No data to display ____________________________________________  EKG   ____________________________________________  RADIOLOGY   No results  found.  ____________________________________________    PROCEDURES  Procedure(s) performed:    Procedures    Medications - No data to display   ____________________________________________   INITIAL IMPRESSION / ASSESSMENT AND PLAN / ED COURSE  Pertinent labs & imaging results that were available during my care of the patient were reviewed by me and considered in my medical decision making (see chart for details).  Review of the [place] CSRS was performed in accordance of the [facility] prior to dispensing any controlled drugs.   Patient presented to the emergency department for concern of mosquito bite.  Vital signs and exam are reassuring.  Patient appears well and is anxious.  No indication of insect bite.  Exam is overall reassuring and unremarkable.  Patient is to follow up with primary care as directed. Patient is given ED precautions to return to the ED for any worsening or new symptoms.     ____________________________________________  FINAL CLINICAL IMPRESSION(S) / ED DIAGNOSES  Final diagnoses:  Feared complaint without diagnosis      NEW  MEDICATIONS STARTED DURING THIS VISIT:  ED Discharge Orders    None          This chart was dictated using voice recognition software/Dragon. Despite best efforts to proofread, errors can occur which can change the meaning. Any change was purely unintentional.    Enid Derry, PA-C 08/15/18 1054    Emily Filbert, MD 08/15/18 1420

## 2018-08-15 NOTE — ED Notes (Signed)
See triage note    States she was bitten by a mosquito last pm  States she woke up feeling tired and "just doesn't feeling well"  Some slight headache  No fever  Also having some right knee pain   No swelling or deformity noted    Afebrile on arrival

## 2018-08-20 ENCOUNTER — Other Ambulatory Visit: Payer: Self-pay

## 2018-08-20 ENCOUNTER — Encounter: Payer: Self-pay | Admitting: Hematology and Oncology

## 2018-08-20 ENCOUNTER — Encounter: Payer: Self-pay | Admitting: Emergency Medicine

## 2018-08-20 ENCOUNTER — Emergency Department
Admission: EM | Admit: 2018-08-20 | Discharge: 2018-08-20 | Disposition: A | Discharge status: Home / Self Care | Payer: BLUE CROSS/BLUE SHIELD | Attending: Emergency Medicine | Admitting: Emergency Medicine

## 2018-08-20 DIAGNOSIS — J45909 Unspecified asthma, uncomplicated: Secondary | ICD-10-CM | POA: Insufficient documentation

## 2018-08-20 DIAGNOSIS — Z79899 Other long term (current) drug therapy: Secondary | ICD-10-CM | POA: Insufficient documentation

## 2018-08-20 DIAGNOSIS — J069 Acute upper respiratory infection, unspecified: Secondary | ICD-10-CM | POA: Diagnosis not present

## 2018-08-20 DIAGNOSIS — E785 Hyperlipidemia, unspecified: Secondary | ICD-10-CM | POA: Insufficient documentation

## 2018-08-20 DIAGNOSIS — R0981 Nasal congestion: Secondary | ICD-10-CM | POA: Diagnosis present

## 2018-08-20 MED ORDER — ALBUTEROL SULFATE HFA 108 (90 BASE) MCG/ACT IN AERS: 2.0000 | INHALATION_SPRAY | Freq: Four times a day (QID) | RESPIRATORY_TRACT | 2 refills | Status: DC | PRN

## 2018-08-20 MED ORDER — FLUTICASONE PROPIONATE 50 MCG/ACT NA SUSP: 1.0000 | Freq: Every day | NASAL | 2 refills | Status: DC

## 2018-08-20 MED ORDER — CETIRIZINE HCL 10 MG PO CAPS: 1.0000 | ORAL_CAPSULE | Freq: Every day | ORAL | 0 refills | Status: DC

## 2018-08-20 NOTE — ED Notes (Signed)
Pt states that she has had nasal congestion and sinus pressure for the last couple of weeks on and off. Pt states today she had had increasingly worse cough and sinus pressure and wants to make sure it is not the flu, etc.

## 2018-08-20 NOTE — ED Triage Notes (Signed)
Patient ambulatory to triage with steady gait, without difficulty or distress noted; pt reports sinus pressure/congestion for several days; pt here with son also to be seen for congestion; also st that she is out of her albuterol and proair inhaler

## 2018-08-21 NOTE — ED Provider Notes (Signed)
[facility] Emergency Department Provider Note  ____________________________________________  Time seen: Approximately 12:00 AM  I have reviewed the triage vital signs and the nursing notes.   HISTORY  Chief Complaint Nasal Congestion    HPI Jasmine Gordon is a 32 y.o. female presents to the emergency department with nasal congestion for the past week.  Patient has been afebrile at home.  No cough.  Patient has associated rhinorrhea.  Patient has not been taking any medications at home.  She is tolerating fluids and food by mouth.  No major changes in stooling or urinary habits.  No shortness of breath, chest tightness or chest pain.  No alleviating measures of been attempted.  Patient is requesting refill of albuterol inhaler.   Past Medical History:  Diagnosis Date  . Allergy   . Anemia   . Asthma   . Migraine   . Panic attack   . PTSD (post-traumatic stress disorder) 2016    Patient Active Problem List   Diagnosis Date Noted  . Positive TB test 09/25/2017  . Allergic contact dermatitis due to metals 05/17/2017  . Pre-diabetes 03/06/2017  . Hyperlipidemia 03/06/2017  . Allergic reaction 02/08/2017  . Urticaria due to food allergy 02/08/2017  . Vitamin D deficiency 11/08/2016  . Iron deficiency anemia due to chronic blood loss 10/03/2016  . Menorrhagia with regular cycle 06/27/2016  . Mild persistent asthma 06/17/2016  . Anxiety and depression 06/17/2016  . Morbid obesity with BMI of 40.0-44.9, adult (HCC) 06/17/2016    Past Surgical History:  Procedure Laterality Date  . APPENDECTOMY    . Plantar warts      Prior to Admission medications   Medication Sig Start Date End Date Taking? Authorizing Provider  albuterol (PROVENTIL HFA;VENTOLIN HFA) 108 (90 Base) MCG/ACT inhaler Inhale 2 puffs into the lungs every 6 (six) hours as needed for wheezing or shortness of breath. 08/20/18   Orvil Feil, PA-C  Cetirizine HCl (ZYRTEC ALLERGY) 10  MG CAPS Take 1 capsule (10 mg total) by mouth daily for 7 days. 08/20/18 08/27/18  Orvil Feil, PA-C  EPINEPHrine 0.3 mg/0.3 mL IJ SOAJ injection INJECT INTRAMUSCULARLY AS DIRECTED 01/29/17   [provider]  famotidine (PEPCID) 40 MG tablet Take 1 tablet (40 mg total) by mouth every evening. Patient not taking: Reported on 07/26/2018 04/02/18 04/02/19  Myrna Blazer, MD  fluticasone [facility]) 50 MCG/ACT nasal spray Place 1 spray into both nostrils daily. 08/20/18 08/20/19  Orvil Feil, PA-C  lactulose (CHRONULAC) 10 GM/15ML solution Take 30 mLs (20 g total) by mouth daily as needed for mild constipation. Patient not taking: Reported on 07/26/2018 07/26/18   Irean Hong, MD  misoprostol (CYTOTEC) 100 MCG tablet Take 1 tablet (100 mcg total) by mouth once for 1 dose. 1 hour before appt 07/04/18 07/04/18  Copland, Ilona Sorrel, PA-C  misoprostol (CYTOTEC) 100 MCG tablet  07/04/18   [provider]  PARAGARD INTRAUTERINE COPPER IU by Intrauterine route. 11/26/15   [provider]    Allergies Reglan [metoclopramide]  Family History  Problem Relation Age of Onset  . Depression Mother   . Mental retardation Mother   . Bipolar disorder Mother   . Diabetes Father   . Stroke Maternal Grandfather   . Diabetes Maternal Grandfather     Social History Social History   Tobacco Use  . Smoking status: Never Smoker  . Smokeless tobacco: Never Used  Substance Use Topics  . Alcohol use: No  .  Drug use: No     Review of Systems  Constitutional: No fever/chills Eyes: No visual changes. No discharge ENT: Patient has nasal congestion.  Cardiovascular: no chest pain. Respiratory: no cough. No SOB. Gastrointestinal: No abdominal pain.  No nausea, no vomiting.  No diarrhea.  No constipation. Genitourinary: Negative for dysuria. No hematuria Musculoskeletal: Negative for musculoskeletal pain. Skin: Negative for rash, abrasions, lacerations, ecchymosis. Neurological:  Negative for headaches, focal weakness or numbness.   ____________________________________________   PHYSICAL EXAM:  VITAL SIGNS: ED Triage Vitals  Enc Vitals Group     BP 08/20/18 2024 120/76     Pulse Rate 08/20/18 2024 98     Resp 08/20/18 2024 18     Temp 08/20/18 2024 98.5 F (36.9 C)     Temp Source 08/20/18 2024 Oral     SpO2 08/20/18 2024 99 %     Weight 08/20/18 2023 248 lb (112.5 kg)     Height 08/20/18 2023 5\' 6"  (1.676 m)     Head Circumference --      Peak Flow --      Pain Score 08/20/18 2022 6     Pain Loc --      Pain Edu? --      Excl. in GC? --      Constitutional: Alert and oriented. Well appearing and in no acute distress. Eyes: Conjunctivae are normal. PERRL. EOMI. Head: Atraumatic. ENT:      Ears: TMs are effused bilaterally.      Nose: No congestion/rhinnorhea.      Mouth/Throat: Mucous membranes are moist.  Neck: No stridor.  No cervical spine tenderness to palpation. Cardiovascular: Normal rate, regular rhythm. Normal S1 and S2.  Good peripheral circulation. Respiratory: Normal respiratory effort without tachypnea or retractions. Lungs CTAB. Good air entry to the bases with no decreased or absent breath sounds. Gastrointestinal: Bowel sounds 4 quadrants. Soft and nontender to palpation. No guarding or rigidity. No palpable masses. No distention. No CVA tenderness. Musculoskeletal: Full range of motion to all extremities. No gross deformities appreciated. Neurologic:  Normal speech and language. No gross focal neurologic deficits are appreciated.  Skin:  Skin is warm, dry and intact. No rash noted.   ____________________________________________   LABS (all labs ordered are listed, but only abnormal results are displayed)  Labs Reviewed - No data to display ____________________________________________  EKG   ____________________________________________  RADIOLOGY  No results  found.  ____________________________________________    PROCEDURES  Procedure(s) performed:    Procedures    Medications - No data to display   ____________________________________________   INITIAL IMPRESSION / ASSESSMENT AND PLAN / ED COURSE  Pertinent labs & imaging results that were available during my care of the patient were reviewed by me and considered in my medical decision making (see chart for details).  Review of the [place] CSRS was performed in accordance of the [facility] prior to dispensing any controlled drugs.      Assessment and plan Viral URI Patient presents to the emergency department with rhinorrhea and nasal congestion for approximately 1 week.  No malaise, fatigue or fever at home that would suggest sinusitis.  Flonase and Zyrtec were prescribed at discharge.  Refill of patient's albuterol inhaler was given.     ____________________________________________  FINAL CLINICAL IMPRESSION(S) / ED DIAGNOSES  Final diagnoses:  Viral upper respiratory tract infection      NEW MEDICATIONS STARTED DURING THIS VISIT:  ED Discharge Orders         Ordered  fluticasone (FLONASE) 50 MCG/ACT nasal spray  Daily     08/20/18 2224    Cetirizine HCl (ZYRTEC ALLERGY) 10 MG CAPS  Daily     08/20/18 2224    albuterol (PROVENTIL HFA;VENTOLIN HFA) 108 (90 Base) MCG/ACT inhaler  Every 6 hours PRN     08/20/18 2227              This chart was dictated using voice recognition software/Dragon. Despite best efforts to proofread, errors can occur which can change the meaning. Any change was purely unintentional.    Orvil Feil, PA-C 08/21/18 Lew Dawes, MD 08/25/18 [phone]

## 2018-08-22 ENCOUNTER — Ambulatory Visit (INDEPENDENT_AMBULATORY_CARE_PROVIDER_SITE_OTHER): Payer: BLUE CROSS/BLUE SHIELD | Admitting: Obstetrics and Gynecology

## 2018-08-22 ENCOUNTER — Telehealth: Payer: Self-pay

## 2018-08-22 ENCOUNTER — Encounter: Payer: Self-pay | Admitting: Obstetrics and Gynecology

## 2018-08-22 VITALS — BP 118/78 | HR 95 | Ht 66.0 in | Wt 250.0 lb

## 2018-08-22 DIAGNOSIS — L988 Other specified disorders of the skin and subcutaneous tissue: Secondary | ICD-10-CM

## 2018-08-22 DIAGNOSIS — A630 Anogenital (venereal) warts: Secondary | ICD-10-CM | POA: Diagnosis not present

## 2018-08-22 DIAGNOSIS — Z30432 Encounter for removal of intrauterine contraceptive device: Secondary | ICD-10-CM

## 2018-08-22 DIAGNOSIS — N649 Disorder of breast, unspecified: Secondary | ICD-10-CM | POA: Diagnosis not present

## 2018-08-22 NOTE — Telephone Encounter (Signed)
Still have not received immunization record from previous office 4:25 pm on 08/22/2018.

## 2018-08-22 NOTE — Telephone Encounter (Signed)
Patient called requesting HPV vaccine suggested to get old record first to confirm that she didn't have this in past and if she doesn't then we can schedule Nurse appointment. Patient is calling old clinic to fax immunization record.

## 2018-08-22 NOTE — Progress Notes (Signed)
[name], DO   Chief Complaint  Patient presents with  . IUD check    Pt feels the strings are getting longer and when she checks strings feels a bump inside like before, pt would like her left breast to be checked cause she notice two bumps this morning, they feel sore at the moment    HPI:      Ms. Josalynn Johndrow is a 32 y.o. [phone] who LMP was Patient's last menstrual period was 08/07/2018 (exact date)., presents today for several issues. Pt would like IUD checked again. Feels strings are longer for past 2 days. Having some mild cramping/spotting and is mid cycle for pt. Previous paragard malpositioned 8/19 with similar sx.  Paragard removed and replaced 07/05/18. Had GYN u/s 07/31/18 for placement because it looked upside down on xray done in ED 07/25/18, and IUD in correct location. Pt would like IUD removed altogether and wants to use condoms. Just wants a break from [facility].   Pt also notes a few bumps in rectal area. Sx itchy before menses but not bothersome otherwise. Noticed 1 bump about 2 months ago but has several now. No new sex partners. No hx of STDs. Last pap neg 07/14/17 with PCP.  She also noticed 2 placed on her LT breast this morning that were itching/mildly tender. No hx of bites, no new detergents/soap. No breast masses.    Past Medical History:  Diagnosis Date  . Allergy   . Anemia   . Asthma   . Migraine   . Panic attack   . PTSD (post-traumatic stress disorder) 2016    Past Surgical History:  Procedure Laterality Date  . APPENDECTOMY    . Plantar warts      Family History  Problem Relation Age of Onset  . Depression Mother   . Mental retardation Mother   . Bipolar disorder Mother   . Diabetes Father   . Stroke Maternal Grandfather   . Diabetes Maternal Grandfather     Social History   Socioeconomic History  . Marital status: Married    Spouse name: Not on file  . Number of children: Not on file  . Years of education: Not on  file  . Highest education level: Not on file  Occupational History  . Not on file  Social Needs  . Financial resource strain: Not on file  . Food insecurity:    Worry: Not on file    Inability: Not on file  . Transportation needs:    Medical: Not on file    Non-medical: Not on file  Tobacco Use  . Smoking status: Never Smoker  . Smokeless tobacco: Never Used  Substance and Sexual Activity  . Alcohol use: No  . Drug use: No  . Sexual activity: Yes    Birth control/protection: IUD    Comment: Paragard  Lifestyle  . Physical activity:    Days per week: Not on file    Minutes per session: Not on file  . Stress: Not on file  Relationships  . Social connections:    Talks on phone: Not on file    Gets together: Not on file    Attends religious service: Not on file    Active member of club or organization: Not on file    Attends meetings of clubs or organizations: Not on file    Relationship status: Not on file  . Intimate partner violence:    Fear of current or ex partner:  Not on file    Emotionally abused: Not on file    Physically abused: Not on file    Forced sexual activity: Not on file  Other Topics Concern  . Not on file  Social History Narrative  . Not on file    Outpatient Medications Prior to Visit  Medication Sig Dispense Refill  . albuterol (PROVENTIL HFA;VENTOLIN HFA) 108 (90 Base) MCG/ACT inhaler Inhale 2 puffs into the lungs every 6 (six) hours as needed for wheezing or shortness of breath. 1 Inhaler 2  . EPINEPHrine 0.3 mg/0.3 mL IJ SOAJ injection INJECT INTRAMUSCULARLY AS DIRECTED  0  . fluticasone (FLONASE) 50 MCG/ACT nasal spray Place 1 spray into both nostrils daily. 16 g 2  . PARAGARD INTRAUTERINE COPPER IU by Intrauterine route.    . Cetirizine HCl (ZYRTEC ALLERGY) 10 MG CAPS Take 1 capsule (10 mg total) by mouth daily for 7 days. (Patient not taking: Reported on 08/22/2018) 30 capsule 0  . famotidine (PEPCID) 40 MG tablet Take 1 tablet (40 mg total)  by mouth every evening. (Patient not taking: Reported on 07/26/2018) 30 tablet 0  . lactulose (CHRONULAC) 10 GM/15ML solution Take 30 mLs (20 g total) by mouth daily as needed for mild constipation. (Patient not taking: Reported on 07/26/2018) 120 mL 0  . misoprostol (CYTOTEC) 100 MCG tablet Take 1 tablet (100 mcg total) by mouth once for 1 dose. 1 hour before appt 1 tablet 0  . misoprostol (CYTOTEC) 100 MCG tablet   0  . PARAGARD INTRAUTERINE COPPER IUD      No facility-administered medications prior to visit.       ROS:  Review of Systems  Constitutional: Negative for fatigue, fever and unexpected weight change.  Respiratory: Negative for cough, shortness of breath and wheezing.   Cardiovascular: Negative for chest pain, palpitations and leg swelling.  Gastrointestinal: Negative for blood in stool, constipation, diarrhea, nausea and vomiting.  Endocrine: Negative for cold intolerance, heat intolerance and polyuria.  Genitourinary: Positive for genital sores and vaginal discharge. Negative for dyspareunia, dysuria, flank pain, frequency, hematuria, menstrual problem, pelvic pain, urgency, vaginal bleeding and vaginal pain.  Musculoskeletal: Negative for back pain, joint swelling and myalgias.  Skin: Negative for rash.  Neurological: Negative for dizziness, syncope, light-headedness, numbness and headaches.  Hematological: Negative for adenopathy.  Psychiatric/Behavioral: Negative for agitation, confusion, sleep disturbance and suicidal ideas. The patient is not nervous/anxious.   BREAST: redness/itch   OBJECTIVE:   Vitals:  BP 118/78   Pulse 95   Ht 5\' 6"  (1.676 m)   Wt 250 lb (113.4 kg)   LMP 08/07/2018 (Exact Date)   BMI 40.35 kg/m   Physical Exam  Constitutional: She is oriented to person, place, and time. Vital signs are normal. She appears well-developed.  Pulmonary/Chest: Effort normal. Right breast exhibits no mass, no nipple discharge and no skin change. Left breast  exhibits skin change. Left breast exhibits no mass and no nipple discharge.  2 SMALL (~4 MM) SLIGHTLY RED, IRRITATED LESIONS LT BREAST ON AREOLAR BORDER; NO BREAST MASSES; NO D/C, NON TENDER    Genitourinary: Vagina normal and uterus normal.    There is no rash, tenderness or lesion on the right labia. There is no rash, tenderness or lesion on the left labia. Uterus is not enlarged and not tender. Cervix exhibits no motion tenderness. Right adnexum displays no mass and no tenderness. Left adnexum displays no mass and no tenderness. No erythema or tenderness in the vagina. No vaginal discharge found.  Genitourinary Comments: SEVERAL WARTY LESIONS PERINEUM AND PERIANAL AREA LT SIDE ONLY; IUD STRINGS LOOK NORMAL LENGTH AND IN CX OS  Musculoskeletal: Normal range of motion.  Neurological: She is alert and oriented to person, place, and time.  Psychiatric: She has a normal mood and affect. Her behavior is normal. Thought content normal.  Vitals reviewed.   IUD Removal Strings of IUD identified and grasped.  IUD removed without problem with ring forceps.  Pt tolerated this well.  IUD noted to be intact.   Assessment/Plan: Encounter for IUD removal - IUD removed. Pt to use condoms for now. F/u prn.  Genital warts - Treated with TCA. Wash in 4-6 hrs/wound mgmt. Pt tolerated well. HPV discussed. F/u prn.   Skin lesion of breast - 2 areas LT breast, question bite/dermatitis. OTC hydrocortisone crm BID for a few days. F/u prn. No masses.     Return if symptoms worsen or fail to improve.  Alicia B. Copland, PA-C 08/22/2018 10:42 AM

## 2018-08-22 NOTE — Patient Instructions (Signed)
I value your feedback and entrusting us with your care. If you get a [place] patient survey, I would appreciate you taking the time to let us know about your experience today. Thank you! 

## 2018-08-22 NOTE — Telephone Encounter (Signed)
Yes, will be ok to get HPV vaccines if not received before.  Needs a total of 3 doses at 0, 1, 6 months.  If she has had any doses in past, verify with me or Dr. Kirtland Bouchard which doses she will need to receive.

## 2018-09-05 ENCOUNTER — Encounter: Payer: Self-pay | Admitting: Hematology and Oncology

## 2018-09-05 ENCOUNTER — Encounter: Payer: Self-pay | Admitting: Obstetrics and Gynecology

## 2018-09-11 ENCOUNTER — Encounter: Payer: Self-pay | Admitting: Obstetrics and Gynecology

## 2018-09-11 ENCOUNTER — Other Ambulatory Visit (HOSPITAL_COMMUNITY)
Admission: RE | Admit: 2018-09-11 | Discharge: 2018-09-11 | Disposition: A | Discharge status: Home / Self Care | Payer: BLUE CROSS/BLUE SHIELD | Source: Ambulatory Visit | Attending: Obstetrics and Gynecology | Admitting: Obstetrics and Gynecology

## 2018-09-11 ENCOUNTER — Ambulatory Visit (INDEPENDENT_AMBULATORY_CARE_PROVIDER_SITE_OTHER): Payer: BLUE CROSS/BLUE SHIELD | Admitting: Obstetrics and Gynecology

## 2018-09-11 VITALS — BP 104/68 | HR 105 | Ht 66.0 in | Wt 252.0 lb

## 2018-09-11 DIAGNOSIS — Z1151 Encounter for screening for human papillomavirus (HPV): Secondary | ICD-10-CM | POA: Diagnosis not present

## 2018-09-11 DIAGNOSIS — Z01419 Encounter for gynecological examination (general) (routine) without abnormal findings: Secondary | ICD-10-CM

## 2018-09-11 DIAGNOSIS — N760 Acute vaginitis: Secondary | ICD-10-CM

## 2018-09-11 DIAGNOSIS — Z01411 Encounter for gynecological examination (general) (routine) with abnormal findings: Secondary | ICD-10-CM

## 2018-09-11 DIAGNOSIS — Z124 Encounter for screening for malignant neoplasm of cervix: Secondary | ICD-10-CM | POA: Insufficient documentation

## 2018-09-11 DIAGNOSIS — A630 Anogenital (venereal) warts: Secondary | ICD-10-CM

## 2018-09-11 DIAGNOSIS — Z3009 Encounter for other general counseling and advice on contraception: Secondary | ICD-10-CM

## 2018-09-11 DIAGNOSIS — R87810 Cervical high risk human papillomavirus (HPV) DNA test positive: Secondary | ICD-10-CM | POA: Insufficient documentation

## 2018-09-11 MED ORDER — CLOTRIMAZOLE-BETAMETHASONE 1-0.05 % EX CREA: TOPICAL_CREAM | CUTANEOUS | 0 refills | Status: DC

## 2018-09-11 NOTE — Patient Instructions (Signed)
I value your feedback and entrusting us with your care. If you get a [place] patient survey, I would appreciate you taking the time to let us know about your experience today. Thank you! 

## 2018-09-11 NOTE — Progress Notes (Signed)
PCP:  [name], DO   Chief Complaint  Patient presents with  . Gynecologic Exam     HPI:      [name] is a 32 y.o. [phone] who LMP was Patient's last menstrual period was 09/04/2018 (exact date)., presents today for her annual examination.  Her menses are regular every 28-30 days, lasting 7 days.  Dysmenorrhea none. She does not have intermenstrual bleeding.  Sex activity: single partner, contraception - condoms. Declines other BC. Had paragard removed earlier this month and feels better without it.  Last Pap: July 14, 2017  Results were: no abnormalities  Hx of STDs: ext HPV lesions; treated with TCA 08/22/18. Lesions improved but some still there. Neg STD testing recently.  Also notes ext vaginal itching. Shaved recently and thinks it's related. Sx increase before menses. Exercises regularly.   There is no FH of breast cancer. There is no FH of ovarian cancer. The patient does do self-breast exams.  Tobacco use: The patient denies current or previous tobacco use. Alcohol use: none No drug use.  Exercise: moderately active  She does get adequate calcium and Vitamin D in her diet.   Past Medical History:  Diagnosis Date  . Allergy   . Anemia   . Asthma   . Migraine   . Panic attack   . PTSD (post-traumatic stress disorder) 2016    Past Surgical History:  Procedure Laterality Date  . APPENDECTOMY    . Plantar warts      Family History  Problem Relation Age of Onset  . Depression Mother   . Mental retardation Mother   . Bipolar disorder Mother   . Diabetes Father   . Stroke Maternal Grandfather   . Diabetes Maternal Grandfather     Social History   Socioeconomic History  . Marital status: Married    Spouse name: Not on file  . Number of children: Not on file  . Years of education: Not on file  . Highest education level: Not on file  Occupational History  . Not on file  Social Needs  . Financial resource strain: Not  on file  . Food insecurity:    Worry: Not on file    Inability: Not on file  . Transportation needs:    Medical: Not on file    Non-medical: Not on file  Tobacco Use  . Smoking status: Never Smoker  . Smokeless tobacco: Never Used  Substance and Sexual Activity  . Alcohol use: No  . Drug use: No  . Sexual activity: Yes    Birth control/protection: Condom  Lifestyle  . Physical activity:    Days per week: Not on file    Minutes per session: Not on file  . Stress: Not on file  Relationships  . Social connections:    Talks on phone: Not on file    Gets together: Not on file    Attends religious service: Not on file    Active member of club or organization: Not on file    Attends meetings of clubs or organizations: Not on file    Relationship status: Not on file  . Intimate partner violence:    Fear of current or ex partner: Not on file    Emotionally abused: Not on file    Physically abused: Not on file    Forced sexual activity: Not on file  Other Topics Concern  . Not on file  Social History Narrative  . Not on  file    Outpatient Medications Prior to Visit  Medication Sig Dispense Refill  . albuterol (PROVENTIL HFA;VENTOLIN HFA) 108 (90 Base) MCG/ACT inhaler Inhale 2 puffs into the lungs every 6 (six) hours as needed for wheezing or shortness of breath. 1 Inhaler 2  . chlorhexidine (PERIDEX) 0.12 % solution RM WITH 1/2 CAPFUL FOR 30 SECONDS BID IN THE MORNING AND EVE AFTER BRUSHING TEETH  1  . EPINEPHrine 0.3 mg/0.3 mL IJ SOAJ injection INJECT INTRAMUSCULARLY AS DIRECTED  0  . fluticasone (FLONASE) 50 MCG/ACT nasal spray Place 1 spray into both nostrils daily. 16 g 2  . Cetirizine HCl (ZYRTEC ALLERGY) 10 MG CAPS Take 1 capsule (10 mg total) by mouth daily for 7 days. (Patient not taking: Reported on 08/22/2018) 30 capsule 0  . famotidine (PEPCID) 40 MG tablet Take 1 tablet (40 mg total) by mouth every evening. (Patient not taking: Reported on 07/26/2018) 30 tablet 0   No  facility-administered medications prior to visit.     ROS:  Review of Systems  Constitutional: Negative for fatigue, fever and unexpected weight change.  Respiratory: Negative for cough, shortness of breath and wheezing.   Cardiovascular: Negative for chest pain, palpitations and leg swelling.  Gastrointestinal: Negative for blood in stool, constipation, diarrhea, nausea and vomiting.  Endocrine: Negative for cold intolerance, heat intolerance and polyuria.  Genitourinary: Negative for dyspareunia, dysuria, flank pain, frequency, genital sores, hematuria, menstrual problem, pelvic pain, urgency, vaginal bleeding, vaginal discharge and vaginal pain.  Musculoskeletal: Negative for back pain, joint swelling and myalgias.  Skin: Negative for rash.  Neurological: Negative for dizziness, syncope, light-headedness, numbness and headaches.  Hematological: Negative for adenopathy.  Psychiatric/Behavioral: Negative for agitation, confusion, sleep disturbance and suicidal ideas. The patient is not nervous/anxious.    BREAST: No symptoms   Objective: BP 104/68   Pulse (!) 105   Ht 5\' 6"  (1.676 m)   Wt 252 lb (114.3 kg)   LMP 09/04/2018 (Exact Date)   BMI 40.67 kg/m    Physical Exam  Constitutional: She is oriented to person, place, and time. She appears well-developed and well-nourished.  Genitourinary: Vagina normal and uterus normal. There is no rash or tenderness on the right labia. There is no rash or tenderness on the left labia.    No erythema or tenderness in the vagina. No vaginal discharge found. Right adnexum does not display mass and does not display tenderness. Left adnexum does not display mass and does not display tenderness. Cervix does not exhibit motion tenderness or polyp. Uterus is not enlarged or tender.  Neck: Normal range of motion. No thyromegaly present.  Cardiovascular: Normal rate, regular rhythm and normal heart sounds.  No murmur heard. Pulmonary/Chest: Effort  normal and breath sounds normal. Right breast exhibits no mass, no nipple discharge, no skin change and no tenderness. Left breast exhibits no mass, no nipple discharge, no skin change and no tenderness.  Abdominal: Soft. There is no tenderness. There is no guarding.  Musculoskeletal: Normal range of motion.  Neurological: She is alert and oriented to person, place, and time. No cranial nerve deficit.  Psychiatric: She has a normal mood and affect. Her behavior is normal.  Vitals reviewed.   Assessment/Plan: Encounter for annual routine gynecological examination  Cervical cancer screening - Plan: Cytology - PAP  Screening for HPV (human papillomavirus) - Plan: Cytology - PAP  Encounter for other general counseling or advice on contraception - Ok with condoms currently. Will call for other BC prn.  Genital warts -  Ext lesions. Pap today. Retreated lesions with TCA. Wash in 4-6 hrs. Wound care. F/u prn.   Acute vaginitis - With fissures/erythema. Keep dry. Rx lotrisone crm. F/u prn.  - Plan: clotrimazole-betamethasone (LOTRISONE) cream  Meds ordered this encounter  Medications  . clotrimazole-betamethasone (LOTRISONE) cream    Sig: Apply externally BID prn sx up to 2 wks    Dispense:  15 g    Refill:  0    Order Specific Question:   Supervising Provider    Answer:   Nadara Mustard [[id number]]             GYN counsel adequate intake of calcium and vitamin D, diet and exercise     F/U  Return in about 1 year (around 09/12/2019).  Alicia B. Copland, PA-C 09/11/2018 10:50 AM

## 2018-09-14 ENCOUNTER — Encounter: Payer: Self-pay | Admitting: Obstetrics and Gynecology

## 2018-09-14 ENCOUNTER — Encounter: Payer: Self-pay | Admitting: Family Medicine

## 2018-09-14 ENCOUNTER — Ambulatory Visit (INDEPENDENT_AMBULATORY_CARE_PROVIDER_SITE_OTHER): Payer: BLUE CROSS/BLUE SHIELD | Admitting: Family Medicine

## 2018-09-14 VITALS — BP 123/61 | HR 61 | Temp 98.6°F | Resp 16 | Ht 66.0 in | Wt 253.0 lb

## 2018-09-14 DIAGNOSIS — Z23 Encounter for immunization: Secondary | ICD-10-CM

## 2018-09-14 DIAGNOSIS — A630 Anogenital (venereal) warts: Secondary | ICD-10-CM

## 2018-09-14 DIAGNOSIS — D500 Iron deficiency anemia secondary to blood loss (chronic): Secondary | ICD-10-CM

## 2018-09-14 LAB — CYTOLOGY - PAP
Adequacy: ABSENT
Diagnosis: NEGATIVE
HPV 16/18/45 genotyping: NEGATIVE
HPV: DETECTED — AB

## 2018-09-14 NOTE — Progress Notes (Signed)
Subjective:    Patient ID: Jasmine Gordon, female    DOB: 08/16/1986, 32 y.o.   MRN: [id number]  Jasmine Gordon is a 32 y.o. female presenting on 09/14/2018 for Anemia (follow up)   HPI   Follow-up Anemia / IDA Last visit with me 07/2018 for same problem, followed by [facility] Hematology as well, has received IV iron. - Today she reports overall now dramatic improvement in energy and fatigue. She attributes some of this actually to recently paraguard IUD removed, stated next day had more energy and now more active. Going to gym regularly 5 x weekly - She has had some wt loss, now more stable - She has blood test CBC follow-up in 1 month with Hematology, she is asking about her upcoming apt with me and labs.  GYN / HPV Genital Wart / Vaccine Followed by [facility] - see prior note on 09/11/18 she had well woman exam with pap smear, pending results, patient waiting on HPV co testing result. She has history of genital warts but no known history of high risk HPV. She is requesting HPV vaccine, she has checked all of her vaccine records and does not have any record of receiving HPV series, would like to start today. She is aware that it would not protect her against HPV already exposed to.  Health Maintenance: Due for Flu Shot, will receive today    Depression screen [facility] 2/9 09/14/2018 07/14/2017 06/17/2016  Decreased Interest 0 0 1  Down, Depressed, Hopeless 0 0 1  PHQ - 2 Score 0 0 2  Altered sleeping - 0 2  Tired, decreased energy - 0 3  Change in appetite - 0 1  Feeling bad or failure about yourself  - 0 1  Trouble concentrating - 0 1  Moving slowly or fidgety/restless - 0 0  Suicidal thoughts - 0 0  PHQ-9 Score - 0 10  Difficult doing work/chores - Not difficult at all -    Social History   Tobacco Use  . Smoking status: Never Smoker  . Smokeless tobacco: Never Used  Substance Use Topics  . Alcohol use: No  . Drug use: No    Review of Systems Per HPI unless  specifically indicated above     Objective:    BP 123/61   Pulse 61   Temp 98.6 F (37 C) (Oral)   Resp 16   Ht 5\' 6"  (1.676 m)   Wt 253 lb (114.8 kg)   LMP 09/04/2018 (Exact Date)   BMI 40.84 kg/m   Wt Readings from Last 3 Encounters:  09/14/18 253 lb (114.8 kg)  09/11/18 252 lb (114.3 kg)  08/22/18 250 lb (113.4 kg)    Physical Exam  Constitutional: She is oriented to person, place, and time. She appears well-developed and well-nourished. No distress.  Well-appearing, comfortable, cooperative, obese  HENT:  Head: Normocephalic and atraumatic.  Mouth/Throat: Oropharynx is clear and moist.  Eyes: Conjunctivae are normal. Right eye exhibits no discharge. Left eye exhibits no discharge.  Cardiovascular: Normal rate.  Pulmonary/Chest: Effort normal.  Musculoskeletal: She exhibits no edema.  Neurological: She is alert and oriented to person, place, and time.  Skin: Skin is warm and dry. No rash noted. She is not diaphoretic. No erythema.  Psychiatric: She has a normal mood and affect. Her behavior is normal.  Well groomed, good eye contact, normal speech and thoughts  Nursing note and vitals reviewed.  Results for orders placed or performed during the hospital encounter  of 07/25/18  Basic metabolic panel  Result Value Ref Range   Sodium 139 135 - 145 mmol/L   Potassium 3.4 (L) 3.5 - 5.1 mmol/L   Chloride 107 98 - 111 mmol/L   CO2 24 22 - 32 mmol/L   Glucose, Bld 103 (H) 70 - 99 mg/dL   BUN 15 6 - 20 mg/dL   Creatinine, Ser 1.61 0.44 - 1.00 mg/dL   Calcium 9.2 8.9 - 09.6 mg/dL   GFR calc non Af Amer >60 >60 mL/min   GFR calc Af Amer >60 >60 mL/min   Anion gap 8 5 - 15  CBC  Result Value Ref Range   WBC 5.3 3.6 - 11.0 K/uL   RBC 4.28 3.80 - 5.20 MIL/uL   Hemoglobin 11.5 (L) 12.0 - 16.0 g/dL   HCT 04.5 40.9 - 81.1 %   MCV 81.7 80.0 - 100.0 fL   MCH 26.7 26.0 - 34.0 pg   MCHC 32.7 32.0 - 36.0 g/dL   RDW 91.4 (H) 78.2 - 95.6 %   Platelets 220 150 - 440 K/uL    Urinalysis, Complete w Microscopic  Result Value Ref Range   Color, Urine YELLOW (A) YELLOW   APPearance HAZY (A) CLEAR   Specific Gravity, Urine 1.034 (H) 1.005 - 1.030   pH 5.0 5.0 - 8.0   Glucose, UA NEGATIVE NEGATIVE mg/dL   Hgb urine dipstick SMALL (A) NEGATIVE   Bilirubin Urine NEGATIVE NEGATIVE   Ketones, ur 5 (A) NEGATIVE mg/dL   Protein, ur 30 (A) NEGATIVE mg/dL   Nitrite NEGATIVE NEGATIVE   Leukocytes, UA NEGATIVE NEGATIVE   RBC / HPF 0-5 0 - 5 RBC/hpf   WBC, UA 0-5 0 - 5 WBC/hpf   Bacteria, UA RARE (A) NONE SEEN   Squamous Epithelial / LPF 21-50 0 - 5   Mucus PRESENT   TSH  Result Value Ref Range   TSH 3.813 0.350 - 4.500 uIU/mL  T4, free  Result Value Ref Range   Free T4 0.82 0.82 - 1.77 ng/dL  Pregnancy, urine  Result Value Ref Range   Preg Test, Ur NEGATIVE NEGATIVE  Troponin I  Result Value Ref Range   Troponin I <0.03 <0.03 ng/mL  Fibrin derivatives D-Dimer ([facility] only)  Result Value Ref Range   Fibrin derivatives D-dimer ([facility]) 922.07 (H) 0.00 - 499.00 ng/mL (FEU)      Assessment & Plan:   Problem List Items Addressed This Visit    Genital warts Already history of HPV exposure w/ genital warts Never had HPV vaccines Pending result from recent GYN pap for HPV status 1st dose Gardisil today for 3 dose series - next due in December 2019 (1-2 months) at apt 10/31/18 3rd final dose due in 6 months from today approx 03/16/19    Iron deficiency anemia due to chronic blood loss - Primary Clinically improved Followed by Hematology Dr Merlene Pulling, has upcoming labs, will cancel my CBC order S/p IV iron Follow-up     Other Visit Diagnoses    Need for HPV vaccination       Relevant Orders   HPV 9-valent vaccine,Recombinat   Needs flu shot       Relevant Orders   Flu Vaccine QUAD 36+ mos IM      No orders of the defined types were placed in this encounter.   Follow up plan: Return for keep lab apt 11/25 - CHANGE apt type for Annual Physical to  FOLLOW-UP.   Change annual physical in  December to a FOLLOW-UP - labs CANCEL CBC, only keep A1c, Lipid, CMET - will already have labs from Hematology  [name], DO [facility] 09/14/2018, 10:00 AM

## 2018-09-14 NOTE — Patient Instructions (Addendum)
Thank you for coming to the office today.  1st Gardisil HPV today - next dose 1-2 months (AT NEXT APT WITH ME in [place]) from now, then final dose is 6 months (APRIL 2020 - nurse visit) from now.  Flu shot today  Keep appointments as scheduled - for blood test from here on 10/15/18 - we will CANCEL the blood counts and anemia testing, ONLY DO Sugar, Cholesterol primarily.  When you come in for December - we will NOT do annual physical, only follow-up labs and updates, sugar etc.   Please schedule a Follow-up Appointment to: Return for keep lab apt 11/25 - CHANGE apt type for Annual Physical to FOLLOW-UP.  If you have any other questions or concerns, please feel free to call the office or send a message through MyChart. You may also schedule an earlier appointment if necessary.  Additionally, you may be receiving a survey about your experience at our office within a few days to 1 week by e-mail or mail. We value your feedback.  Jasmine Pilar, DO [facility], [place]

## 2018-09-17 ENCOUNTER — Other Ambulatory Visit: Payer: Self-pay

## 2018-09-17 ENCOUNTER — Emergency Department
Admission: EM | Admit: 2018-09-17 | Discharge: 2018-09-17 | Disposition: A | Discharge status: Home / Self Care | Payer: BLUE CROSS/BLUE SHIELD | Attending: Emergency Medicine | Admitting: Emergency Medicine

## 2018-09-17 ENCOUNTER — Encounter: Payer: Self-pay | Admitting: Emergency Medicine

## 2018-09-17 DIAGNOSIS — F329 Major depressive disorder, single episode, unspecified: Secondary | ICD-10-CM | POA: Diagnosis not present

## 2018-09-17 DIAGNOSIS — F419 Anxiety disorder, unspecified: Secondary | ICD-10-CM | POA: Insufficient documentation

## 2018-09-17 DIAGNOSIS — J45909 Unspecified asthma, uncomplicated: Secondary | ICD-10-CM | POA: Diagnosis not present

## 2018-09-17 DIAGNOSIS — R1012 Left upper quadrant pain: Secondary | ICD-10-CM | POA: Diagnosis not present

## 2018-09-17 DIAGNOSIS — Z79899 Other long term (current) drug therapy: Secondary | ICD-10-CM | POA: Insufficient documentation

## 2018-09-17 LAB — COMPREHENSIVE METABOLIC PANEL
ALT: 9 U/L (ref 0–44)
AST: 14 U/L — ABNORMAL LOW (ref 15–41)
Albumin: 4 g/dL (ref 3.5–5.0)
Alkaline Phosphatase: 46 U/L (ref 38–126)
Anion gap: 8 (ref 5–15)
BUN: 14 mg/dL (ref 6–20)
CO2: 24 mmol/L (ref 22–32)
Calcium: 9.1 mg/dL (ref 8.9–10.3)
Chloride: 106 mmol/L (ref 98–111)
Creatinine, Ser: 0.73 mg/dL (ref 0.44–1.00)
GFR calc Af Amer: >60 mL/min (ref >60)
GFR calc non Af Amer: >60 mL/min (ref >60)
Glucose, Bld: 91 mg/dL (ref 70–99)
Potassium: 3.8 mmol/L (ref 3.5–5.1)
Sodium: 138 mmol/L (ref 135–145)
Total Bilirubin: 0.5 mg/dL (ref 0.3–1.2)
Total Protein: 7.3 g/dL (ref 6.5–8.1)

## 2018-09-17 LAB — URINALYSIS, COMPLETE (UACMP) WITH MICROSCOPIC
Bacteria, UA: NONE SEEN
Bilirubin Urine: NEGATIVE
Glucose, UA: NEGATIVE mg/dL
Ketones, ur: NEGATIVE mg/dL
Leukocytes, UA: NEGATIVE
Nitrite: NEGATIVE
Protein, ur: NEGATIVE mg/dL
Specific Gravity, Urine: 1.021 (ref 1.005–1.030)
pH: 5 (ref 5.0–8.0)

## 2018-09-17 LAB — CBC
HCT: 36.3 % (ref 36.0–46.0)
Hemoglobin: 11.5 g/dL — ABNORMAL LOW (ref 12.0–15.0)
MCH: 26.6 pg (ref 26.0–34.0)
MCHC: 31.7 g/dL (ref 30.0–36.0)
MCV: 83.8 fL (ref 80.0–100.0)
Platelets: 226 10*3/uL (ref 150–400)
RBC: 4.33 MIL/uL (ref 3.87–5.11)
RDW: 13.4 % (ref 11.5–15.5)
WBC: 3.8 10*3/uL — ABNORMAL LOW (ref 4.0–10.5)
nRBC: 0 % (ref 0.0–0.2)

## 2018-09-17 LAB — LIPASE, BLOOD: Lipase: 24 U/L (ref 11–51)

## 2018-09-17 LAB — POCT PREGNANCY, URINE: Preg Test, Ur: NEGATIVE

## 2018-09-17 MED ORDER — OMEPRAZOLE 10 MG PO CPDR: 10.0000 mg | DELAYED_RELEASE_CAPSULE | Freq: Every day | ORAL | 1 refills | Status: DC

## 2018-09-17 NOTE — Discharge Instructions (Addendum)
Your lab work today is reassuring. I suspect that your symptoms are related to GERD.  Please follow-up with GI for further work-up and evaluation.

## 2018-09-17 NOTE — ED Provider Notes (Signed)
[facility] Emergency Department Provider Note  ____________________________________________  Time seen: Approximately 10:49 AM  I have reviewed the triage vital signs and the nursing notes.   HISTORY  Chief Complaint Abdominal Pain    HPI Jasmine Gordon is a 32 y.o. female presents emergency department for evaluation of intermittent pain to left upper quadrant for 2 to 3 months and lump to right axilla for 2 days.  Patient states that she received the Gardasil shot in her left arm and the influenza shot in her right arm on Friday.  She states that right after she noticed a small "pea-sized tender lump" to her left axilla.  She had a routine woman's wellness exam last week.  She does not have a history of breast cancer and has no family history of breast cancer.  She states that she has discussed left upper quadrant pain with her primary care provider many times and does not have an answer.  She has been given medications for heartburn.  Patient states that pain seems to be worse when she "eats a lot." She does not notice any specific foods that it is worse with.  Pain does not change with movement.  Pain has not changed today.  No nausea, vomiting, back pain, diarrhea.  Past Medical History:  Diagnosis Date  . Allergy   . Anemia   . Asthma   . Migraine   . Panic attack   . PTSD (post-traumatic stress disorder) 2016    Patient Active Problem List   Diagnosis Date Noted  . Genital warts 09/11/2018  . Positive TB test 09/25/2017  . Allergic contact dermatitis due to metals 05/17/2017  . Pre-diabetes 03/06/2017  . Hyperlipidemia 03/06/2017  . Allergic reaction 02/08/2017  . Urticaria due to food allergy 02/08/2017  . Vitamin D deficiency 11/08/2016  . Iron deficiency anemia due to chronic blood loss 10/03/2016  . Menorrhagia with regular cycle 06/27/2016  . Mild persistent asthma 06/17/2016  . Anxiety and depression 06/17/2016  . Morbid obesity with  BMI of 40.0-44.9, adult (HCC) 06/17/2016    Past Surgical History:  Procedure Laterality Date  . APPENDECTOMY    . Plantar warts      Prior to Admission medications   Medication Sig Start Date End Date Taking? Authorizing Provider  albuterol (PROVENTIL HFA;VENTOLIN HFA) 108 (90 Base) MCG/ACT inhaler Inhale 2 puffs into the lungs every 6 (six) hours as needed for wheezing or shortness of breath. 08/20/18   Orvil Feil, PA-C  chlorhexidine (PERIDEX) 0.12 % solution RM WITH 1/2 CAPFUL FOR 30 SECONDS BID IN THE MORNING AND EVE AFTER BRUSHING TEETH 08/29/18   [provider]  clotrimazole-betamethasone (LOTRISONE) cream Apply externally BID prn sx up to 2 wks 09/11/18   Copland, Alicia B, PA-C  EPINEPHrine 0.3 mg/0.3 mL IJ SOAJ injection INJECT INTRAMUSCULARLY AS DIRECTED 01/29/17   [provider]  fluticasone (FLONASE) 50 MCG/ACT nasal spray Place 1 spray into both nostrils daily. 08/20/18 08/20/19  Orvil Feil, PA-C  omeprazole (PRILOSEC) 10 MG capsule Take 1 capsule (10 mg total) by mouth daily. 09/17/18 09/17/19  Enid Derry, PA-C    Allergies Reglan [metoclopramide]  Family History  Problem Relation Age of Onset  . Depression Mother   . Mental retardation Mother   . Bipolar disorder Mother   . Diabetes Father   . Stroke Maternal Grandfather   . Diabetes Maternal Grandfather     Social History Social History   Tobacco Use  . Smoking  status: Never Smoker  . Smokeless tobacco: Never Used  Substance Use Topics  . Alcohol use: No  . Drug use: No     Review of Systems  Constitutional: No fever/chills Cardiovascular: No chest pain. Respiratory: No SOB. Gastrointestinal: Positive for abdominal pain.  No nausea, no vomiting.  Musculoskeletal: Negative for musculoskeletal pain. Skin: Negative for rash, abrasions, lacerations, ecchymosis. Neurological: Negative for headaches, numbness or  tingling   ____________________________________________   PHYSICAL EXAM:  VITAL SIGNS: ED Triage Vitals  Enc Vitals Group     BP 09/17/18 0845 111/69     Pulse Rate 09/17/18 0845 89     Resp 09/17/18 0845 18     Temp 09/17/18 0845 98.2 F (36.8 C)     Temp Source 09/17/18 0845 Oral     SpO2 09/17/18 0845 98 %     Weight 09/17/18 0845 250 lb (113.4 kg)     Height 09/17/18 0845 5\' 7"  (1.702 m)     Head Circumference --      Peak Flow --      Pain Score 09/17/18 0904 6     Pain Loc --      Pain Edu? --      Excl. in GC? --      Constitutional: Alert and oriented. Well appearing and in no acute distress. Eyes: Conjunctivae are normal. PERRL. EOMI. Head: Atraumatic. ENT:      Ears:      Nose: No congestion/rhinnorhea.      Mouth/Throat: Mucous membranes are moist.  Neck: No stridor.   Cardiovascular: Normal rate, regular rhythm.  Good peripheral circulation. Respiratory: Normal respiratory effort without tachypnea or retractions. Lungs CTAB. Good air entry to the bases with no decreased or absent breath sounds. Gastrointestinal: Bowel sounds 4 quadrants. Soft and nontender to palpation. No guarding or rigidity. No palpable masses. No distention.  Musculoskeletal: Full range of motion to all extremities. No gross deformities appreciated.  No palpable mass to left axilla. Neurologic:  Normal speech and language. No gross focal neurologic deficits are appreciated.  Skin:  Skin is warm, dry and intact. No rash noted. Psychiatric: Mood and affect are normal. Speech and behavior are normal. Patient exhibits appropriate insight and judgement.   ____________________________________________   LABS (all labs ordered are listed, but only abnormal results are displayed)  Labs Reviewed  COMPREHENSIVE METABOLIC PANEL - Abnormal; Notable for the following components:      Result Value   AST 14 (*)    All other components within normal limits  CBC - Abnormal; Notable for the  following components:   WBC 3.8 (*)    Hemoglobin 11.5 (*)    All other components within normal limits  URINALYSIS, COMPLETE (UACMP) WITH MICROSCOPIC - Abnormal; Notable for the following components:   Color, Urine YELLOW (*)    APPearance CLEAR (*)    Hgb urine dipstick SMALL (*)    All other components within normal limits  LIPASE, BLOOD  POC URINE PREG, ED  POCT PREGNANCY, URINE   ____________________________________________  EKG   ____________________________________________  RADIOLOGY   No results found.  ____________________________________________    PROCEDURES  Procedure(s) performed:    Procedures    Medications - No data to display   ____________________________________________   INITIAL IMPRESSION / ASSESSMENT AND PLAN / ED COURSE  Pertinent labs & imaging results that were available during my care of the patient were reviewed by me and considered in my medical decision making (see chart for details).  Review of the [place] CSRS was performed in accordance of the [facility] prior to dispensing any controlled drugs.     Patient presents emergency department for evaluation of intermittent abdominal pain for 2 to 3 months and palpable lump to left axilla for 2 days.  Vital signs and exam are reassuring.  We discussed that abdominal discomfort could be due to heartburn or ulcer and needs to be worked up with GI and primary care.  She will be started on omeprazole.  Her symptoms have not changed in character today.  Patient describes a palpable lump that she felt after receiving Gardasil and influenza.  Symptoms sound like a lymph node.  I do not palpate a mass or lymph node currently.  Patient cannot feel a mass currently either.  Patient will follow-up with primary care in 1 week if she feels this mass again..  Patient will be discharged home with prescriptions for omeprazole. Patient is to follow up with primary care and GI as directed. Patient is given ED  precautions to return to the ED for any worsening or new symptoms.     ____________________________________________  FINAL CLINICAL IMPRESSION(S) / ED DIAGNOSES  Final diagnoses:  Left upper quadrant pain      NEW MEDICATIONS STARTED DURING THIS VISIT:  ED Discharge Orders         Ordered    omeprazole (PRILOSEC) 10 MG capsule  Daily     09/17/18 1123              This chart was dictated using voice recognition software/Dragon. Despite best efforts to proofread, errors can occur which can change the meaning. Any change was purely unintentional.    Enid Derry, PA-C 09/17/18 1601    Governor Rooks, MD 09/17/18 1806

## 2018-09-17 NOTE — ED Triage Notes (Signed)
Left upper abd pain on and off for 2 months.  Has been to pcp for same.  Also got guardasil shot Friday and now her left shoulder and left ear are hurting and has lump under left arm.

## 2018-09-17 NOTE — ED Notes (Signed)
See triage note  States she has had pain to left lateral abd  Pain is mainly under left rib area  States pain started 2 months ago and has been intermittent  Denies any n/v/d or fever with pain  Also is currently having ear pain and swelling under left arm

## 2018-09-26 ENCOUNTER — Other Ambulatory Visit: Payer: Self-pay | Admitting: Gastroenterology

## 2018-09-26 DIAGNOSIS — R1032 Left lower quadrant pain: Secondary | ICD-10-CM

## 2018-09-26 DIAGNOSIS — R6881 Early satiety: Secondary | ICD-10-CM

## 2018-09-26 DIAGNOSIS — R140 Abdominal distension (gaseous): Secondary | ICD-10-CM

## 2018-09-26 DIAGNOSIS — R110 Nausea: Secondary | ICD-10-CM

## 2018-09-27 ENCOUNTER — Ambulatory Visit
Admission: RE | Admit: 2018-09-27 | Discharge: 2018-09-27 | Disposition: A | Discharge status: Home / Self Care | Payer: BLUE CROSS/BLUE SHIELD | Source: Ambulatory Visit | Attending: Gastroenterology | Admitting: Gastroenterology

## 2018-09-27 DIAGNOSIS — R1032 Left lower quadrant pain: Secondary | ICD-10-CM | POA: Diagnosis present

## 2018-09-27 DIAGNOSIS — R140 Abdominal distension (gaseous): Secondary | ICD-10-CM | POA: Insufficient documentation

## 2018-09-27 DIAGNOSIS — R110 Nausea: Secondary | ICD-10-CM | POA: Insufficient documentation

## 2018-09-27 DIAGNOSIS — R6881 Early satiety: Secondary | ICD-10-CM

## 2018-10-02 ENCOUNTER — Encounter: Payer: Self-pay | Admitting: Gastroenterology

## 2018-10-15 ENCOUNTER — Other Ambulatory Visit: Payer: BLUE CROSS/BLUE SHIELD

## 2018-10-15 DIAGNOSIS — Z0000 Encounter for general adult medical examination without abnormal findings: Secondary | ICD-10-CM

## 2018-10-15 DIAGNOSIS — Z6841 Body Mass Index (BMI) 40.0 and over, adult: Secondary | ICD-10-CM

## 2018-10-15 DIAGNOSIS — F419 Anxiety disorder, unspecified: Secondary | ICD-10-CM

## 2018-10-15 DIAGNOSIS — R7303 Prediabetes: Secondary | ICD-10-CM

## 2018-10-15 DIAGNOSIS — F329 Major depressive disorder, single episode, unspecified: Secondary | ICD-10-CM

## 2018-10-15 DIAGNOSIS — F32A Depression, unspecified: Secondary | ICD-10-CM

## 2018-10-15 DIAGNOSIS — E7800 Pure hypercholesterolemia, unspecified: Secondary | ICD-10-CM

## 2018-10-16 LAB — LIPID PANEL
Cholesterol: 192 mg/dL (ref <200)
HDL: 44 mg/dL — ABNORMAL LOW (ref >50)
LDL Cholesterol (Calc): 134 mg/dL (calc) — ABNORMAL HIGH
Non-HDL Cholesterol (Calc): 148 mg/dL (calc) — ABNORMAL HIGH (ref <130)
Total CHOL/HDL Ratio: 4.4 (calc) (ref <5.0)
Triglycerides: 53 mg/dL (ref <150)

## 2018-10-16 LAB — COMPLETE METABOLIC PANEL WITH GFR
AG Ratio: 1.8 (calc) (ref 1.0–2.5)
ALT: 7 U/L (ref 6–29)
AST: 14 U/L (ref 10–30)
Albumin: 4.1 g/dL (ref 3.6–5.1)
Alkaline phosphatase (APISO): 40 U/L (ref 33–115)
BUN: 12 mg/dL (ref 7–25)
CO2: 23 mmol/L (ref 20–32)
Calcium: 9.3 mg/dL (ref 8.6–10.2)
Chloride: 105 mmol/L (ref 98–110)
Creat: 0.76 mg/dL (ref 0.50–1.10)
GFR, Est African American: 122 mL/min/{1.73_m2} (ref >=60)
GFR, Est Non African American: 105 mL/min/{1.73_m2} (ref >=60)
Globulin: 2.3 g/dL (calc) (ref 1.9–3.7)
Glucose, Bld: 79 mg/dL (ref 65–99)
Potassium: 3.9 mmol/L (ref 3.5–5.3)
Sodium: 137 mmol/L (ref 135–146)
Total Bilirubin: 0.3 mg/dL (ref 0.2–1.2)
Total Protein: 6.4 g/dL (ref 6.1–8.1)

## 2018-10-16 LAB — HEMOGLOBIN A1C
Hgb A1c MFr Bld: 5.6 % of total Hgb (ref <5.7)
Mean Plasma Glucose: 114 (calc)
eAG (mmol/L): 6.3 (calc)

## 2018-10-18 ENCOUNTER — Other Ambulatory Visit: Payer: Self-pay

## 2018-10-18 ENCOUNTER — Emergency Department
Admission: EM | Admit: 2018-10-18 | Discharge: 2018-10-19 | Disposition: A | Discharge status: Home / Self Care | Payer: BLUE CROSS/BLUE SHIELD | Attending: Emergency Medicine | Admitting: Emergency Medicine

## 2018-10-18 DIAGNOSIS — J040 Acute laryngitis: Secondary | ICD-10-CM

## 2018-10-18 DIAGNOSIS — R42 Dizziness and giddiness: Secondary | ICD-10-CM | POA: Diagnosis present

## 2018-10-18 DIAGNOSIS — B349 Viral infection, unspecified: Secondary | ICD-10-CM

## 2018-10-18 DIAGNOSIS — Z79899 Other long term (current) drug therapy: Secondary | ICD-10-CM | POA: Diagnosis not present

## 2018-10-18 DIAGNOSIS — E860 Dehydration: Secondary | ICD-10-CM | POA: Insufficient documentation

## 2018-10-18 DIAGNOSIS — R002 Palpitations: Secondary | ICD-10-CM | POA: Diagnosis not present

## 2018-10-18 DIAGNOSIS — J45909 Unspecified asthma, uncomplicated: Secondary | ICD-10-CM | POA: Insufficient documentation

## 2018-10-18 LAB — CBC
HCT: 36.2 % (ref 36.0–46.0)
Hemoglobin: 11.4 g/dL — ABNORMAL LOW (ref 12.0–15.0)
MCH: 26.4 pg (ref 26.0–34.0)
MCHC: 31.5 g/dL (ref 30.0–36.0)
MCV: 83.8 fL (ref 80.0–100.0)
Platelets: 226 10*3/uL (ref 150–400)
RBC: 4.32 MIL/uL (ref 3.87–5.11)
RDW: 13.3 % (ref 11.5–15.5)
WBC: 5.2 10*3/uL (ref 4.0–10.5)
nRBC: 0 % (ref 0.0–0.2)

## 2018-10-18 LAB — URINALYSIS, COMPLETE (UACMP) WITH MICROSCOPIC
Bilirubin Urine: NEGATIVE
Glucose, UA: NEGATIVE mg/dL
Ketones, ur: NEGATIVE mg/dL
Nitrite: NEGATIVE
Protein, ur: NEGATIVE mg/dL
Specific Gravity, Urine: 1.01 (ref 1.005–1.030)
pH: 6 (ref 5.0–8.0)

## 2018-10-18 LAB — BASIC METABOLIC PANEL
Anion gap: 7 (ref 5–15)
BUN: 16 mg/dL (ref 6–20)
CO2: 23 mmol/L (ref 22–32)
Calcium: 9.6 mg/dL (ref 8.9–10.3)
Chloride: 107 mmol/L (ref 98–111)
Creatinine, Ser: 0.71 mg/dL (ref 0.44–1.00)
GFR calc Af Amer: >60 mL/min (ref >60)
GFR calc non Af Amer: >60 mL/min (ref >60)
Glucose, Bld: 112 mg/dL — ABNORMAL HIGH (ref 70–99)
Potassium: 3.8 mmol/L (ref 3.5–5.1)
Sodium: 137 mmol/L (ref 135–145)

## 2018-10-18 LAB — POCT PREGNANCY, URINE: Preg Test, Ur: NEGATIVE

## 2018-10-18 NOTE — ED Triage Notes (Signed)
Pt arrives to ED via POV from home with c/o palpitations and dizziness that the pt states she noticed "just before going to bed". Pt states she didn't notice the s/x's until she was laying down to sleep and now with c/o insomnia also. Pt denies CP or SHOB; no c/o N/V/D or fever. Pt also states her "bowel movements have been less regular". Pt is A&O, in NAD; RR even, regular, and unlabored.

## 2018-10-19 LAB — TSH: TSH: 4.494 u[IU]/mL (ref 0.350–4.500)

## 2018-10-19 LAB — T4, FREE: Free T4: 0.69 ng/dL — ABNORMAL LOW (ref 0.82–1.77)

## 2018-10-19 MED ORDER — DEXAMETHASONE SODIUM PHOSPHATE 10 MG/ML IJ SOLN
10.0000 mg | Freq: Once | INTRAMUSCULAR | Status: AC
  Administered 2018-10-19: 10 mg via INTRAVENOUS
  Filled 2018-10-19: qty 1

## 2018-10-19 MED ORDER — SODIUM CHLORIDE 0.9 % IV BOLUS
1000.0000 mL | Freq: Once | INTRAVENOUS | Status: AC
  Administered 2018-10-19: 1000 mL via INTRAVENOUS

## 2018-10-19 MED ORDER — DIPHENHYDRAMINE HCL 50 MG/ML IJ SOLN
25.0000 mg | Freq: Once | INTRAMUSCULAR | Status: AC
  Administered 2018-10-19: 25 mg via INTRAVENOUS
  Filled 2018-10-19: qty 1

## 2018-10-19 NOTE — Discharge Instructions (Signed)
It was a pleasure to take care of you today, and thank you for coming to our emergency department.  If you have any questions or concerns before leaving please ask the nurse to grab me and I'm more than happy to go through your aftercare instructions again.  If you were prescribed any opioid pain medication today such as Norco, Vicodin, Percocet, morphine, hydrocodone, or oxycodone please make sure you do not drive when you are taking this medication as it can alter your ability to drive safely.  If you have any concerns once you are home that you are not improving or are in fact getting worse before you can make it to your follow-up appointment, please do not hesitate to call 911 and come back for further evaluation.  Merrily Brittle, MD  Results for orders placed or performed during the hospital encounter of 10/18/18  Basic metabolic panel  Result Value Ref Range   Sodium 137 135 - 145 mmol/L   Potassium 3.8 3.5 - 5.1 mmol/L   Chloride 107 98 - 111 mmol/L   CO2 23 22 - 32 mmol/L   Glucose, Bld 112 (H) 70 - 99 mg/dL   BUN 16 6 - 20 mg/dL   Creatinine, Ser 0.98 0.44 - 1.00 mg/dL   Calcium 9.6 8.9 - 11.9 mg/dL   GFR calc non Af Amer >60 >60 mL/min   GFR calc Af Amer >60 >60 mL/min   Anion gap 7 5 - 15  CBC  Result Value Ref Range   WBC 5.2 4.0 - 10.5 K/uL   RBC 4.32 3.87 - 5.11 MIL/uL   Hemoglobin 11.4 (L) 12.0 - 15.0 g/dL   HCT 14.7 82.9 - 56.2 %   MCV 83.8 80.0 - 100.0 fL   MCH 26.4 26.0 - 34.0 pg   MCHC 31.5 30.0 - 36.0 g/dL   RDW 13.0 86.5 - 78.4 %   Platelets 226 150 - 400 K/uL   nRBC 0.0 0.0 - 0.2 %  Urinalysis, Complete w Microscopic  Result Value Ref Range   Color, Urine YELLOW (A) YELLOW   APPearance HAZY (A) CLEAR   Specific Gravity, Urine 1.010 1.005 - 1.030   pH 6.0 5.0 - 8.0   Glucose, UA NEGATIVE NEGATIVE mg/dL   Hgb urine dipstick SMALL (A) NEGATIVE   Bilirubin Urine NEGATIVE NEGATIVE   Ketones, ur NEGATIVE NEGATIVE mg/dL   Protein, ur NEGATIVE NEGATIVE mg/dL     Nitrite NEGATIVE NEGATIVE   Leukocytes, UA MODERATE (A) NEGATIVE   RBC / HPF 6-10 0 - 5 RBC/hpf   WBC, UA 6-10 0 - 5 WBC/hpf   Bacteria, UA MANY (A) NONE SEEN   Squamous Epithelial / LPF 6-10 0 - 5  T4, free  Result Value Ref Range   Free T4 0.69 (L) 0.82 - 1.77 ng/dL  TSH  Result Value Ref Range   TSH 4.494 0.350 - 4.500 uIU/mL  Pregnancy, urine POC  Result Value Ref Range   Preg Test, Ur NEGATIVE NEGATIVE   US Abdomen Complete  Result Date: 09/27/2018 CLINICAL DATA:  Abdominal pain bloating EXAM: ABDOMEN ULTRASOUND COMPLETE COMPARISON:  07/26/2018 FINDINGS: Gallbladder: No gallstones or wall thickening visualized. No sonographic Murphy sign noted by sonographer. Common bile duct: Diameter: 3.6 mm Liver: No focal lesion identified. Within normal limits in parenchymal echogenicity. Portal vein is patent on color Doppler imaging with normal direction of blood flow towards the liver. IVC: No abnormality visualized. Pancreas: Visualized portion unremarkable. Spleen: Size and appearance within normal limits.  Right Kidney: Length: 11.1 cm. Echogenicity within normal limits. No mass or hydronephrosis visualized. Left Kidney: Length: 10.5 cm. Echogenicity within normal limits. No mass or hydronephrosis visualized. Abdominal aorta: No aneurysm visualized. Other findings: None. IMPRESSION: Negative abdominal ultrasound Electronically Signed   By: Jasmine PangKim  Fujinaga M.D.   On: 09/27/2018 15:30

## 2018-10-19 NOTE — ED Notes (Signed)
Pt in NA d at time of departure, VSS, pt ambulatory, denies further concerns regarding visit

## 2018-10-19 NOTE — ED Provider Notes (Signed)
[facility] Emergency Department Provider Note  ____________________________________________   First MD Initiated Contact with Patient 10/19/18 0003     (approximate)  I have reviewed the triage vital signs and the nursing notes.   HISTORY  Chief Complaint Palpitations and Dizziness   HPI Jasmine Gordon is a 32 y.o. female self presents to the emergency department with lightheadedness and palpitations  that began acutely roughly 1 hour prior to arrival when she was getting ready for bed.  She is been battling an upper respiratory tract infection for the past 2 days including rhinorrhea as well as congestion and dry cough.  She has recently lost her voice and feels generally tired and "rundown".  Denies fevers or chills.  She has had several loose stools.  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  She has no family history of sudden cardiac death.  She did not pass out.    Past Medical History:  Diagnosis Date  . Allergy   . Anemia   . Asthma   . Migraine   . Panic attack   . PTSD (post-traumatic stress disorder) 2016    Patient Active Problem List   Diagnosis Date Noted  . Genital warts 09/11/2018  . Positive TB test 09/25/2017  . Allergic contact dermatitis due to metals 05/17/2017  . Pre-diabetes 03/06/2017  . Hyperlipidemia 03/06/2017  . Allergic reaction 02/08/2017  . Urticaria due to food allergy 02/08/2017  . Vitamin D deficiency 11/08/2016  . Iron deficiency anemia due to chronic blood loss 10/03/2016  . Menorrhagia with regular cycle 06/27/2016  . Mild persistent asthma 06/17/2016  . Anxiety and depression 06/17/2016  . Morbid obesity with BMI of 40.0-44.9, adult (HCC) 06/17/2016    Past Surgical History:  Procedure Laterality Date  . APPENDECTOMY    . Plantar warts      Prior to Admission medications   Medication Sig  Start Date End Date Taking? Authorizing Provider  albuterol (PROVENTIL HFA;VENTOLIN HFA) 108 (90 Base) MCG/ACT inhaler Inhale 2 puffs into the lungs every 6 (six) hours as needed for wheezing or shortness of breath. 08/20/18   Orvil Feil, PA-C  chlorhexidine (PERIDEX) 0.12 % solution RM WITH 1/2 CAPFUL FOR 30 SECONDS BID IN THE MORNING AND EVE AFTER BRUSHING TEETH 08/29/18   [provider]  clotrimazole-betamethasone (LOTRISONE) cream Apply externally BID prn sx up to 2 wks 09/11/18   Copland, Alicia B, PA-C  EPINEPHrine 0.3 mg/0.3 mL IJ SOAJ injection INJECT INTRAMUSCULARLY AS DIRECTED 01/29/17   [provider]  fluticasone (FLONASE) 50 MCG/ACT nasal spray Place 1 spray into both nostrils daily. 08/20/18 08/20/19  Orvil Feil, PA-C  omeprazole (PRILOSEC) 10 MG capsule Take 1 capsule (10 mg total) by mouth daily. 09/17/18 09/17/19  Enid Derry, PA-C    Allergies Reglan [metoclopramide]  Family History  Problem Relation Age of Onset  . Depression Mother   . Mental retardation Mother   . Bipolar disorder Mother   . Diabetes Father   . Stroke Maternal Grandfather   . Diabetes Maternal Grandfather     Social History Social History   Tobacco Use  . Smoking status: Never Smoker  . Smokeless tobacco: Never Used  Substance Use Topics  . Alcohol use: No  . Drug use: No    Review of Systems Constitutional: No fever/chills Eyes: No visual changes.  ENT: Positive for sore throat Cardiovascular: Denies chest pain. Respiratory: Positive for cough Gastrointestinal: No abdominal pain.  No nausea, no vomiting.  No diarrhea.  No constipation. Genitourinary: Negative for dysuria. Musculoskeletal: Negative for back pain. Skin: Negative for rash. Neurological: Positive for headache  ____________________________________________   PHYSICAL EXAM:  VITAL SIGNS: ED Triage Vitals  Enc Vitals Group     BP 10/18/18 2245 121/73     Pulse Rate 10/18/18 2245 100      Resp 10/18/18 2245 16     Temp 10/18/18 2245 98.5 F (36.9 C)     Temp Source 10/18/18 2245 Oral     SpO2 10/18/18 2245 100 %     Weight 10/18/18 2243 253 lb (114.8 kg)     Height 10/18/18 2243 5\' 7"  (1.702 m)     Head Circumference --      Peak Flow --      Pain Score 10/18/18 2243 8     Pain Loc --      Pain Edu? --      Excl. in GC? --     Constitutional: Alert and oriented x4 speaks with hoarse voice nontoxic no diaphoresis Eyes: PERRL EOMI. Head: Atraumatic.  Bilateral tympanic membranes with mild fluid behind them but no erythema or bulging Nose: No congestion/rhinnorhea. Mouth/Throat: No trismus uvula midline no pharyngeal erythema or exudate.  No lymphadenopathy Neck: No stridor.   Cardiovascular: Normal rate, regular rhythm. Grossly normal heart sounds.  Good peripheral circulation. Respiratory: Normal respiratory effort.  No retractions. Lungs CTAB and moving good air Gastrointestinal: Soft nontender Musculoskeletal: No lower extremity edema   Neurologic:  Normal speech and language. No gross focal neurologic deficits are appreciated. Skin:  Skin is warm, dry and intact. No rash noted. Psychiatric: Mood and affect are normal. Speech and behavior are normal.    ____________________________________________   DIFFERENTIAL includes but not limited to  Viral syndrome, laryngitis, pharyngitis, otitis media, pneumonia ____________________________________________   LABS (all labs ordered are listed, but only abnormal results are displayed)  Labs Reviewed  BASIC METABOLIC PANEL - Abnormal; Notable for the following components:      Result Value   Glucose, Bld 112 (*)    All other components within normal limits  CBC - Abnormal; Notable for the following components:   Hemoglobin 11.4 (*)    All other components within normal limits  URINALYSIS, COMPLETE (UACMP) WITH MICROSCOPIC - Abnormal; Notable for the following components:   Color, Urine YELLOW (*)    APPearance  HAZY (*)    Hgb urine dipstick SMALL (*)    Leukocytes, UA MODERATE (*)    Bacteria, UA MANY (*)    All other components within normal limits  T4, FREE - Abnormal; Notable for the following components:   Free T4 0.69 (*)    All other components within normal limits  TSH  POCT PREGNANCY, URINE    Lab work reviewed by me with no acute disease noted.  She does have many bacteria in her urinalysis although it is not a clean sample and no dysuria so will opt against treating __________________________________________  EKG  ED ECG REPORT I, Merrily BrittleNeil Jaiden Dinkins, the attending physician, personally viewed and interpreted this ECG.  Date: 10/21/2018 EKG Time:  Rate: 92 Rhythm: normal sinus rhythm QRS Axis: normal Intervals: normal ST/T Wave abnormalities: normal Narrative Interpretation: no evidence of acute ischemia  ____________________________________________  RADIOLOGY   ____________________________________________   PROCEDURES  Procedure(s) performed: no  Procedures  Critical Care performed:  no  ____________________________________________   INITIAL IMPRESSION / ASSESSMENT AND PLAN / ED COURSE  Pertinent labs & imaging results that were available during my care of the patient were reviewed by me and considered in my medical decision making (see chart for details).   As part of my medical decision making, I reviewed the following data within the electronic MEDICAL RECORD NUMBER History obtained from family if available, nursing notes, old chart and ekg, as well as notes from prior ED visits.  Patient comes to the emergency department with recent URI and now laryngitis.  She feels generally run down and has had intermittent palpitations.  Lab work is fortunately reassuring.  Given dexamethasone, Benadryl, and IV fluids with improvement in her symptoms.  No indication for antibiotics at this point.  I will give her the day off of work because I think she would benefit from rest.   Strict return precautions been given.      ____________________________________________   FINAL CLINICAL IMPRESSION(S) / ED DIAGNOSES  Final diagnoses:  Viral syndrome  Dehydration  Laryngitis      NEW MEDICATIONS STARTED DURING THIS VISIT:  Discharge Medication List as of 10/19/2018  1:32 AM       Note:  This document was prepared using Dragon voice recognition software and may include unintentional dictation errors.     Merrily Brittle, MD 10/21/18 [phone]

## 2018-10-24 ENCOUNTER — Encounter: Payer: BLUE CROSS/BLUE SHIELD | Admitting: Family Medicine

## 2018-10-25 ENCOUNTER — Inpatient Hospital Stay: Payer: Medicaid Other | Attending: Hematology and Oncology

## 2018-10-25 DIAGNOSIS — D509 Iron deficiency anemia, unspecified: Secondary | ICD-10-CM | POA: Insufficient documentation

## 2018-10-25 DIAGNOSIS — Z79899 Other long term (current) drug therapy: Secondary | ICD-10-CM | POA: Insufficient documentation

## 2018-10-30 ENCOUNTER — Inpatient Hospital Stay: Payer: Medicaid Other | Attending: Hematology and Oncology

## 2018-10-30 ENCOUNTER — Ambulatory Visit: Payer: BLUE CROSS/BLUE SHIELD | Admitting: Obstetrics and Gynecology

## 2018-10-30 DIAGNOSIS — D509 Iron deficiency anemia, unspecified: Secondary | ICD-10-CM | POA: Diagnosis not present

## 2018-10-30 DIAGNOSIS — D500 Iron deficiency anemia secondary to blood loss (chronic): Secondary | ICD-10-CM

## 2018-10-30 LAB — CBC WITH DIFFERENTIAL/PLATELET
Abs Immature Granulocytes: 0.01 10*3/uL (ref 0.00–0.07)
Basophils Absolute: 0 10*3/uL (ref 0.0–0.1)
Basophils Relative: 0 %
Eosinophils Absolute: 0 10*3/uL (ref 0.0–0.5)
Eosinophils Relative: 1 %
HCT: 36.6 % (ref 36.0–46.0)
Hemoglobin: 11.3 g/dL — ABNORMAL LOW (ref 12.0–15.0)
Immature Granulocytes: 0 %
Lymphocytes Relative: 42 %
Lymphs Abs: 2 10*3/uL (ref 0.7–4.0)
MCH: 26 pg (ref 26.0–34.0)
MCHC: 30.9 g/dL (ref 30.0–36.0)
MCV: 84.1 fL (ref 80.0–100.0)
Monocytes Absolute: 0.2 10*3/uL (ref 0.1–1.0)
Monocytes Relative: 5 %
Neutro Abs: 2.5 10*3/uL (ref 1.7–7.7)
Neutrophils Relative %: 52 %
Platelets: 214 10*3/uL (ref 150–400)
RBC: 4.35 MIL/uL (ref 3.87–5.11)
RDW: 13.4 % (ref 11.5–15.5)
WBC: 4.8 10*3/uL (ref 4.0–10.5)
nRBC: 0 % (ref 0.0–0.2)

## 2018-10-30 LAB — FERRITIN: Ferritin: 26 ng/mL (ref 11–307)

## 2018-10-31 ENCOUNTER — Ambulatory Visit: Payer: BLUE CROSS/BLUE SHIELD | Admitting: Family Medicine

## 2018-11-06 ENCOUNTER — Ambulatory Visit (INDEPENDENT_AMBULATORY_CARE_PROVIDER_SITE_OTHER): Payer: Medicaid Other | Admitting: Family Medicine

## 2018-11-06 ENCOUNTER — Encounter: Payer: Self-pay | Admitting: Family Medicine

## 2018-11-06 ENCOUNTER — Inpatient Hospital Stay: Payer: Medicaid Other

## 2018-11-06 VITALS — BP 119/66 | HR 72 | Temp 98.8°F | Resp 16 | Ht 66.0 in | Wt 250.0 lb

## 2018-11-06 VITALS — BP 110/75 | HR 91 | Temp 97.0°F | Resp 18

## 2018-11-06 DIAGNOSIS — D500 Iron deficiency anemia secondary to blood loss (chronic): Secondary | ICD-10-CM | POA: Diagnosis not present

## 2018-11-06 DIAGNOSIS — Z23 Encounter for immunization: Secondary | ICD-10-CM | POA: Diagnosis not present

## 2018-11-06 DIAGNOSIS — E559 Vitamin D deficiency, unspecified: Secondary | ICD-10-CM

## 2018-11-06 DIAGNOSIS — R7303 Prediabetes: Secondary | ICD-10-CM | POA: Diagnosis not present

## 2018-11-06 DIAGNOSIS — Z6841 Body Mass Index (BMI) 40.0 and over, adult: Secondary | ICD-10-CM

## 2018-11-06 DIAGNOSIS — J4530 Mild persistent asthma, uncomplicated: Secondary | ICD-10-CM | POA: Diagnosis not present

## 2018-11-06 DIAGNOSIS — D509 Iron deficiency anemia, unspecified: Secondary | ICD-10-CM | POA: Diagnosis not present

## 2018-11-06 MED ORDER — SODIUM CHLORIDE 0.9 % IV SOLN
Freq: Once | INTRAVENOUS | Status: AC
  Administered 2018-11-06: 14:00:00 via INTRAVENOUS
  Filled 2018-11-06: qty 250

## 2018-11-06 MED ORDER — ALBUTEROL SULFATE HFA 108 (90 BASE) MCG/ACT IN AERS: 2.0000 | INHALATION_SPRAY | Freq: Four times a day (QID) | RESPIRATORY_TRACT | 2 refills | Status: DC | PRN

## 2018-11-06 MED ORDER — NEBULIZER/ADULT MASK KIT: PACK | 0 refills | Status: DC

## 2018-11-06 MED ORDER — ALBUTEROL SULFATE (2.5 MG/3ML) 0.083% IN NEBU: 2.5000 mg | INHALATION_SOLUTION | Freq: Four times a day (QID) | RESPIRATORY_TRACT | 1 refills | Status: DC | PRN

## 2018-11-06 MED ORDER — SODIUM CHLORIDE 0.9 % IV SOLN: 200.0000 mg | Freq: Once | INTRAVENOUS | Status: DC

## 2018-11-06 MED ORDER — IRON SUCROSE 20 MG/ML IV SOLN
200.0000 mg | Freq: Once | INTRAVENOUS | Status: AC
  Administered 2018-11-06: 200 mg via INTRAVENOUS
  Filled 2018-11-06: qty 10

## 2018-11-06 NOTE — Progress Notes (Signed)
Subjective:    Patient ID: Jasmine Gordon, female    DOB: 22-Feb-1986, 32 y.o.   MRN: [id number]  Edwena Samentha Perham is a 32 y.o. female presenting on 11/06/2018 for Anemia   HPI   Follow-up Iron Deficiency Anemia / Menorrhagia - Last visit with me 08/2018, for same problem, has been followed by [facility] Hematology Dr Mike Gip for this, treated with IV iron therapy and lab monitoring, additionally she has been following w/ GYN and had removal of copper IUD and she has had significant improvement in her menstrual cycle now it is regular and light bleeding, no more heavy bleeding, see prior notes for background information. - Today patient reports she continues to feel better from stand point of anemia, no further fatigue or tired, feels more energy, doing well - Scheduled for iron infusion later today w/ Hematology - Last Hgb 11.3 10/30/18 Admits mild dull headache recently - Still s/p IUD removal, regular menstrual cycle, lighter bleeding. She is using condoms w/ husband for contraception. Denies heavy bleeding, dyspnea, chest pain or tightness, near syncope  Vitamin D Deficiency Prior trend low Vit D 15 in past few years, now more recently up to 17 (07/2018), she was taking Vitamin D3, 5000 daily for 3 months, now transitioned back to lower dose Vitamin D3 2,000  Mild Persistent Asthma, without flare up / History of Allergic reaction No recent flare up or problem. She request refill on Albuterol inhaler and nebulizer solution, she has machine at home, also needs mask and tubing. She rarely uses but wanted to have it ready in case she needed in future.  Morbid Obesity / PreDM Last lab A1c 5.6 Wt down 3 lbs She is improving exercise now with more energy  Health Maintenance: HPV Vaccine - Due today for Gardisil #2 vaccine - answered questions about vaccine safety, efficacy and potential side effects today, she had Gardisil #1 back in 08/2018, had some mild ache and low grade fever  immune response, final dose is due 02/2019.  Depression screen [facility] 2/9 11/06/2018 09/14/2018 07/14/2017  Decreased Interest 0 0 0  Down, Depressed, Hopeless 0 0 0  PHQ - 2 Score 0 0 0  Altered sleeping 0 - 0  Tired, decreased energy 0 - 0  Change in appetite 0 - 0  Feeling bad or failure about yourself  0 - 0  Trouble concentrating 0 - 0  Moving slowly or fidgety/restless 0 - 0  Suicidal thoughts 0 - 0  PHQ-9 Score 0 - 0  Difficult doing work/chores Not difficult at all - Not difficult at all    Social History   Tobacco Use  . Smoking status: Never Smoker  . Smokeless tobacco: Never Used  Substance Use Topics  . Alcohol use: No  . Drug use: No    Review of Systems Per HPI unless specifically indicated above     Objective:    BP 119/66   Pulse 72   Temp 98.8 F (37.1 C) (Oral)   Resp 16   Ht _0  (1.676 m)   Wt 250 lb (113.4 kg)   BMI 40.35 kg/m   Wt Readings from Last 3 Encounters:  11/06/18 250 lb (113.4 kg)  10/18/18 253 lb (114.8 kg)  09/17/18 250 lb (113.4 kg)    Physical Exam Vitals signs and nursing note reviewed.  Constitutional:      General: She is not in acute distress.    Appearance: She is well-developed. She is not diaphoretic.  Comments: Well-appearing, comfortable, cooperative, obese  HENT:     Head: Normocephalic and atraumatic.  Neck:     Musculoskeletal: Normal range of motion and neck supple.     Thyroid: No thyromegaly.  Cardiovascular:     Rate and Rhythm: Normal rate and regular rhythm.     Heart sounds: Normal heart sounds. No murmur.  Pulmonary:     Effort: Pulmonary effort is normal. No respiratory distress.     Breath sounds: Normal breath sounds. No wheezing or rales.  Lymphadenopathy:     Cervical: No cervical adenopathy.  Skin:    General: Skin is warm and dry.     Findings: No erythema or rash.  Neurological:     Mental Status: She is alert and oriented to person, place, and time.  Psychiatric:        Behavior:  Behavior normal.     Comments: Well groomed, good eye contact, normal speech and thoughts    Recent Labs    10/15/18 1022  HGBA1C 5.6    Results for orders placed or performed in visit on 10/30/18  Ferritin  Result Value Ref Range   Ferritin 26 11 - 307 ng/mL  CBC with Differential  Result Value Ref Range   WBC 4.8 4.0 - 10.5 K/uL   RBC 4.35 3.87 - 5.11 MIL/uL   Hemoglobin 11.3 (L) 12.0 - 15.0 g/dL   HCT 36.6 36.0 - 46.0 %   MCV 84.1 80.0 - 100.0 fL   MCH 26.0 26.0 - 34.0 pg   MCHC 30.9 30.0 - 36.0 g/dL   RDW 13.4 11.5 - 15.5 %   Platelets 214 150 - 400 K/uL   nRBC 0.0 0.0 - 0.2 %   Neutrophils Relative % 52 %   Neutro Abs 2.5 1.7 - 7.7 K/uL   Lymphocytes Relative 42 %   Lymphs Abs 2.0 0.7 - 4.0 K/uL   Monocytes Relative 5 %   Monocytes Absolute 0.2 0.1 - 1.0 K/uL   Eosinophils Relative 1 %   Eosinophils Absolute 0.0 0.0 - 0.5 K/uL   Basophils Relative 0 %   Basophils Absolute 0.0 0.0 - 0.1 K/uL   Immature Granulocytes 0 %   Abs Immature Granulocytes 0.01 0.00 - 0.07 K/uL      Assessment & Plan:   Problem List Items Addressed This Visit    Iron deficiency anemia due to chronic blood loss - Primary    Stable, improved, Hgb 11.3 Followed by [facility] Heme[facility]Onc S/p IV iron therapy, has another treatment IV iron infusion today OFF copper paraguard IUD, removed previously, now has improved menstrual  Follow-up w/ [facility]ematology, has planned labs office visit and iron treatment for March 2020      Mild persistent asthma    Stable without flare up Remains off ICS / maintenance therapy  Refill Albuterol inhaler, albuterol neb solution and neb tube/mask supplies - may need other rx order for supplies sent from pharmacy Follow-up PRN      Relevant Medications   albuterol (PROVENTIL HFA;VENTOLIN HFA) 108 (90 Base) MCG/ACT inhaler   albuterol (PROVENTIL) (2.5 MG/3ML) 0.083% nebulizer solution   Respiratory Therapy Supplies (NEBULIZER/ADULT MASK) KIT   Morbid obesity  with BMI of 40.0-44.9, adult (HCC)    Wt slightly improved down 3 lbs in 1 month Improving exercise and lifestyle, encourage wt loss Follow-up again in future for focused wt visit      Pre-diabetes    Recent improved A1c to 5.6 Failed Metformin XR Encourage continue  improved lifestyle diet, exercise Follow-up repeat A1c q 6-12 months      Vitamin D deficiency    Previously improved but still low Vit D On Vitamin D3 2,000 iu daily, she had headache with 5k Due for repeat today, check Vitamin D, follow-up      Relevant Orders   VITAMIN D 25 Hydroxy (Vit-D Deficiency, Fractures)    Other Visit Diagnoses    Need for HPV vaccination       Relevant Orders   HPV 9-valent vaccine,Recombinat (Completed)    #2 Gardisil vaccine today, counseling on benefit, risk, side effect Next dose due #3 02/2019 - after 6 mo from 1st dose   Meds ordered this encounter  Medications  . albuterol (PROVENTIL HFA;VENTOLIN HFA) 108 (90 Base) MCG/ACT inhaler    Sig: Inhale 2 puffs into the lungs every 6 (six) hours as needed for wheezing or shortness of breath.    Dispense:  1 Inhaler    Refill:  2  . albuterol (PROVENTIL) (2.5 MG/3ML) 0.083% nebulizer solution    Sig: Take 3 mLs (2.5 mg total) by nebulization every 6 (six) hours as needed for wheezing or shortness of breath.    Dispense:  150 mL    Refill:  1  . Respiratory Therapy Supplies (NEBULIZER/ADULT MASK) KIT    Sig: Use nebulizer mask and supplies kit for nebulizer machine as needed    Dispense:  1 each    Refill:  0    Follow up plan: Return in about 6 months (around 05/08/2019) for 4-5 month follow-up Anemia, Gardisil #3.  Nobie Putnam, [facility] 11/06/2018, 9:06 AM

## 2018-11-06 NOTE — Patient Instructions (Addendum)
Thank you for coming to the office today.  HPV vaccine Gardisil #2 today - next due in 6 months from the original vaccine - approximately April 2020 - can do nurse visit or office visit.  Re-=check vitamin D today, stay tuned, continue with Vitamin D3 2,000 iu daily for now.  Improved iron as discussed, keep up with IV iron infusion and follow with Hematology  Re ordered Albuterol inhaler and nebulizer solution - also tried to order mask and tubing, if they cannot process this order, see if they can order it for you and fax us a request  Please schedule a Follow-up Appointment to: Return in about 6 months (around 05/08/2019) for 4-5 month follow-up Anemia, Gardisil #3.  If you have any other questions or concerns, please feel free to call the office or send a message through MyChart. You may also schedule an earlier appointment if necessary.  Additionally, you may be receiving a survey about your experience at our office within a few days to 1 week by e-mail or mail. We value your feedback.  Saralyn PilarAlexander Carlise Stofer, DO [facility]

## 2018-11-06 NOTE — Assessment & Plan Note (Signed)
Stable, improved, Hgb 11.3 [facility] S/p IV iron therapy, has another treatment IV iron infusion today OFF copper paraguard IUD, removed previously, now has improved menstrual  [facility], has planned labs office visit and iron treatment for March 2020

## 2018-11-06 NOTE — Assessment & Plan Note (Signed)
Previously improved but still low Vit D On Vitamin D3 2,000 iu daily, she had headache with 5k Due for repeat today, check Vitamin D, follow-up

## 2018-11-06 NOTE — Assessment & Plan Note (Signed)
Wt slightly improved down 3 lbs in 1 month Improving exercise and lifestyle, encourage wt loss Follow-up again in future for focused wt visit

## 2018-11-06 NOTE — Assessment & Plan Note (Signed)
Stable without flare up Remains off ICS / maintenance therapy  Refill Albuterol inhaler, albuterol neb solution and neb tube/mask supplies - may need other rx order for supplies sent from pharmacy Follow-up PRN

## 2018-11-06 NOTE — Assessment & Plan Note (Signed)
Recent improved A1c to 5.6 Failed Metformin XR Encourage continue improved lifestyle diet, exercise Follow-up repeat A1c q 6-12 months

## 2018-11-07 LAB — VITAMIN D 25 HYDROXY (VIT D DEFICIENCY, FRACTURES): Vit D, 25-Hydroxy: 15 ng/mL — ABNORMAL LOW (ref 30–100)

## 2018-11-08 ENCOUNTER — Ambulatory Visit: Payer: Self-pay | Admitting: Obstetrics and Gynecology

## 2018-11-08 ENCOUNTER — Ambulatory Visit: Payer: BLUE CROSS/BLUE SHIELD | Admitting: Gastroenterology

## 2018-11-12 ENCOUNTER — Inpatient Hospital Stay: Payer: Medicaid Other

## 2018-11-16 ENCOUNTER — Encounter: Payer: Self-pay | Admitting: Hematology and Oncology

## 2018-11-19 ENCOUNTER — Inpatient Hospital Stay: Payer: Medicaid Other

## 2018-11-19 VITALS — BP 106/73 | HR 105 | Temp 96.7°F | Resp 18

## 2018-11-19 DIAGNOSIS — D509 Iron deficiency anemia, unspecified: Secondary | ICD-10-CM | POA: Diagnosis not present

## 2018-11-19 DIAGNOSIS — D500 Iron deficiency anemia secondary to blood loss (chronic): Secondary | ICD-10-CM

## 2018-11-19 DIAGNOSIS — Z79899 Other long term (current) drug therapy: Secondary | ICD-10-CM | POA: Diagnosis not present

## 2018-11-19 MED ORDER — IRON SUCROSE 20 MG/ML IV SOLN
200.0000 mg | Freq: Once | INTRAVENOUS | Status: AC
  Administered 2018-11-19: 200 mg via INTRAVENOUS
  Filled 2018-11-19: qty 10

## 2018-11-19 MED ORDER — SODIUM CHLORIDE 0.9 % IV SOLN
Freq: Once | INTRAVENOUS | Status: AC
  Administered 2018-11-19: 15:00:00 via INTRAVENOUS
  Filled 2018-11-19: qty 250

## 2018-11-23 ENCOUNTER — Encounter: Payer: Self-pay | Admitting: Obstetrics and Gynecology

## 2018-11-23 ENCOUNTER — Ambulatory Visit (INDEPENDENT_AMBULATORY_CARE_PROVIDER_SITE_OTHER): Payer: Medicaid Other | Admitting: Obstetrics and Gynecology

## 2018-11-23 VITALS — BP 120/90 | HR 100 | Ht 66.0 in | Wt 255.0 lb

## 2018-11-23 DIAGNOSIS — A630 Anogenital (venereal) warts: Secondary | ICD-10-CM | POA: Diagnosis not present

## 2018-11-23 MED ORDER — IMIQUIMOD 5 % EX CREA: TOPICAL_CREAM | CUTANEOUS | 2 refills | Status: DC

## 2018-11-23 NOTE — Patient Instructions (Signed)
I value your feedback and entrusting us with your care. If you get a Success patient survey, I would appreciate you taking the time to let us know about your experience today. Thank you! 

## 2018-11-23 NOTE — Progress Notes (Signed)
[name], DO   Chief Complaint  Patient presents with  . Follow-up    reappearance of warts    HPI:      [name] is a 33 y.o. [phone] who LMP was Patient's last menstrual period was 10/30/2018 (exact date)., presents today for f/u ext genital wart tx.  Warts treated with TCA 08/22/18 and 09/11/18. Pt doesn't feel like there was any change in lesions after last tx. Lesions in perineal and perianal area, as well as post fourchette.  Acute vaginitis sx improved after lotrisone crm 10/19.  Past Medical History:  Diagnosis Date  . Allergy   . Anemia   . Asthma   . Migraine   . Panic attack   . PTSD (post-traumatic stress disorder) 2016    Past Surgical History:  Procedure Laterality Date  . APPENDECTOMY    . Plantar warts      Family History  Problem Relation Age of Onset  . Depression Mother   . Mental retardation Mother   . Bipolar disorder Mother   . Diabetes Father   . Stroke Maternal Grandfather   . Diabetes Maternal Grandfather     Social History   Socioeconomic History  . Marital status: Married    Spouse name: Not on file  . Number of children: Not on file  . Years of education: Not on file  . Highest education level: Not on file  Occupational History  . Not on file  Social Needs  . Financial resource strain: Not on file  . Food insecurity:    Worry: Not on file    Inability: Not on file  . Transportation needs:    Medical: Not on file    Non-medical: Not on file  Tobacco Use  . Smoking status: Never Smoker  . Smokeless tobacco: Never Used  Substance and Sexual Activity  . Alcohol use: No  . Drug use: No  . Sexual activity: Yes    Birth control/protection: Condom  Lifestyle  . Physical activity:    Days per week: Not on file    Minutes per session: Not on file  . Stress: Not on file  Relationships  . Social connections:    Talks on phone: Not on file    Gets together: Not on file    Attends religious  service: Not on file    Active member of club or organization: Not on file    Attends meetings of clubs or organizations: Not on file    Relationship status: Not on file  . Intimate partner violence:    Fear of current or ex partner: Not on file    Emotionally abused: Not on file    Physically abused: Not on file    Forced sexual activity: Not on file  Other Topics Concern  . Not on file  Social History Narrative  . Not on file    Outpatient Medications Prior to Visit  Medication Sig Dispense Refill  . albuterol (PROVENTIL HFA;VENTOLIN HFA) 108 (90 Base) MCG/ACT inhaler Inhale 2 puffs into the lungs every 6 (six) hours as needed for wheezing or shortness of breath. 1 Inhaler 2  . albuterol (PROVENTIL) (2.5 MG/3ML) 0.083% nebulizer solution Take 3 mLs (2.5 mg total) by nebulization every 6 (six) hours as needed for wheezing or shortness of breath. 150 mL 1  . chlorhexidine (PERIDEX) 0.12 % solution RM WITH 1/2 CAPFUL FOR 30 SECONDS BID IN THE MORNING AND EVE AFTER BRUSHING TEETH  1  . Cholecalciferol (VITAMIN D-1000 MAX ST) 25 MCG (1000 UT) tablet Take by mouth.    . clotrimazole-betamethasone (LOTRISONE) cream Apply externally BID prn sx up to 2 wks 15 g 0  . EPINEPHrine 0.3 mg/0.3 mL IJ SOAJ injection INJECT INTRAMUSCULARLY AS DIRECTED  0  . fluticasone (FLONASE) 50 MCG/ACT nasal spray Place 1 spray into both nostrils daily. 16 g 2  . omeprazole (PRILOSEC) 10 MG capsule Take 1 capsule (10 mg total) by mouth daily. 30 capsule 1  . Respiratory Therapy Supplies (NEBULIZER/ADULT MASK) KIT Use nebulizer mask and supplies kit for nebulizer machine as needed 1 each 0   No facility-administered medications prior to visit.       ROS:  Review of Systems  Constitutional: Negative for fever.  Gastrointestinal: Negative for blood in stool, constipation, diarrhea, nausea and vomiting.  Genitourinary: Positive for genital sores. Negative for dyspareunia, dysuria, flank pain, frequency,  hematuria, urgency, vaginal bleeding, vaginal discharge and vaginal pain.  Musculoskeletal: Negative for back pain.  Skin: Negative for rash.   OBJECTIVE:   Vitals:  BP 120/90   Pulse 100   Ht _0  (1.676 m)   Wt 255 lb (115.7 kg)   LMP 10/30/2018 (Exact Date)   BMI 41.16 kg/m   Physical Exam Vitals signs reviewed.  Constitutional:      Appearance: She is well-developed.  Neck:     Musculoskeletal: Normal range of motion.  Pulmonary:     Effort: Pulmonary effort is normal.  Genitourinary:      Comments: EXT HPV LESIONS; TREATED WITH TCA Musculoskeletal: Normal range of motion.  Neurological:     Mental Status: She is alert and oriented to person, place, and time.     Cranial Nerves: No cranial nerve deficit.  Psychiatric:        Behavior: Behavior normal.        Thought Content: Thought content normal.        Judgment: Judgment normal.     Assessment/Plan: Genital warts - Treated wtih TCA. Wash in 4-6 hrs. Give it 2 wks, then start Rx aldara if lesions persist. F/u prn. May need to see derm if lesions don't resolve. - Plan: imiquimod (ALDARA) 5 % cream    Meds ordered this encounter  Medications  . imiquimod (ALDARA) 5 % cream    Sig: Apply topically 3 (three) times a week. Apply until total clearance or maximum of 12 weeks    Dispense:  12 each    Refill:  2    Order Specific Question:   Supervising Provider    Answer:   Gae Dry [[id number]]      Return if symptoms worsen or fail to improve.  [name], PA-C 11/23/2018 11:08 AM

## 2018-12-09 ENCOUNTER — Encounter: Payer: Self-pay | Admitting: Emergency Medicine

## 2018-12-09 ENCOUNTER — Emergency Department
Admission: EM | Admit: 2018-12-09 | Discharge: 2018-12-10 | Disposition: A | Discharge status: Home / Self Care | Payer: Medicaid Other | Attending: Emergency Medicine | Admitting: Emergency Medicine

## 2018-12-09 ENCOUNTER — Other Ambulatory Visit: Payer: Self-pay

## 2018-12-09 ENCOUNTER — Emergency Department: Payer: Medicaid Other

## 2018-12-09 DIAGNOSIS — R203 Hyperesthesia: Secondary | ICD-10-CM | POA: Diagnosis not present

## 2018-12-09 DIAGNOSIS — R0602 Shortness of breath: Secondary | ICD-10-CM | POA: Diagnosis not present

## 2018-12-09 DIAGNOSIS — R079 Chest pain, unspecified: Secondary | ICD-10-CM | POA: Diagnosis not present

## 2018-12-09 DIAGNOSIS — Z79899 Other long term (current) drug therapy: Secondary | ICD-10-CM | POA: Diagnosis not present

## 2018-12-09 DIAGNOSIS — J4521 Mild intermittent asthma with (acute) exacerbation: Secondary | ICD-10-CM | POA: Insufficient documentation

## 2018-12-09 DIAGNOSIS — J45909 Unspecified asthma, uncomplicated: Secondary | ICD-10-CM | POA: Diagnosis not present

## 2018-12-09 DIAGNOSIS — E785 Hyperlipidemia, unspecified: Secondary | ICD-10-CM | POA: Insufficient documentation

## 2018-12-09 DIAGNOSIS — R0789 Other chest pain: Secondary | ICD-10-CM | POA: Diagnosis not present

## 2018-12-09 DIAGNOSIS — T50905A Adverse effect of unspecified drugs, medicaments and biological substances, initial encounter: Secondary | ICD-10-CM | POA: Diagnosis not present

## 2018-12-09 LAB — BASIC METABOLIC PANEL
Anion gap: 6 (ref 5–15)
BUN: 13 mg/dL (ref 6–20)
CO2: 24 mmol/L (ref 22–32)
Calcium: 9.3 mg/dL (ref 8.9–10.3)
Chloride: 107 mmol/L (ref 98–111)
Creatinine, Ser: 0.62 mg/dL (ref 0.44–1.00)
GFR calc Af Amer: >60 mL/min (ref >60)
GFR calc non Af Amer: >60 mL/min (ref >60)
Glucose, Bld: 99 mg/dL (ref 70–99)
Potassium: 3.8 mmol/L (ref 3.5–5.1)
Sodium: 137 mmol/L (ref 135–145)

## 2018-12-09 LAB — CBC
HCT: 37.6 % (ref 36.0–46.0)
Hemoglobin: 11.7 g/dL — ABNORMAL LOW (ref 12.0–15.0)
MCH: 26.4 pg (ref 26.0–34.0)
MCHC: 31.1 g/dL (ref 30.0–36.0)
MCV: 84.7 fL (ref 80.0–100.0)
Platelets: 217 10*3/uL (ref 150–400)
RBC: 4.44 MIL/uL (ref 3.87–5.11)
RDW: 13.3 % (ref 11.5–15.5)
WBC: 5.3 10*3/uL (ref 4.0–10.5)
nRBC: 0 % (ref 0.0–0.2)

## 2018-12-09 LAB — URINALYSIS, COMPLETE (UACMP) WITH MICROSCOPIC
Bacteria, UA: NONE SEEN
Bilirubin Urine: NEGATIVE
Glucose, UA: NEGATIVE mg/dL
Ketones, ur: NEGATIVE mg/dL
Leukocytes, UA: NEGATIVE
Nitrite: NEGATIVE
Protein, ur: NEGATIVE mg/dL
Specific Gravity, Urine: 1.01 (ref 1.005–1.030)
pH: 6 (ref 5.0–8.0)

## 2018-12-09 LAB — POCT PREGNANCY, URINE: Preg Test, Ur: NEGATIVE

## 2018-12-09 LAB — TROPONIN I: Troponin I: <0.03 ng/mL (ref <0.03)

## 2018-12-09 MED ORDER — IPRATROPIUM-ALBUTEROL 0.5-2.5 (3) MG/3ML IN SOLN
3.0000 mL | Freq: Once | RESPIRATORY_TRACT | Status: AC
  Administered 2018-12-09: 3 mL via RESPIRATORY_TRACT
  Filled 2018-12-09: qty 3

## 2018-12-09 MED ORDER — DEXAMETHASONE SODIUM PHOSPHATE 10 MG/ML IJ SOLN
10.0000 mg | Freq: Once | INTRAMUSCULAR | Status: AC
  Administered 2018-12-10: 10 mg via INTRAVENOUS
  Filled 2018-12-09: qty 1

## 2018-12-09 MED ORDER — SODIUM CHLORIDE 0.9 % IV BOLUS
1000.0000 mL | Freq: Once | INTRAVENOUS | Status: AC
  Administered 2018-12-10: 1000 mL via INTRAVENOUS

## 2018-12-09 MED ORDER — KETOROLAC TROMETHAMINE 30 MG/ML IJ SOLN
15.0000 mg | Freq: Once | INTRAMUSCULAR | Status: AC
  Administered 2018-12-10: 15 mg via INTRAVENOUS
  Filled 2018-12-09: qty 1

## 2018-12-09 NOTE — ED Provider Notes (Signed)
[facility] Emergency Department Provider Note  ____________________________________________   First MD Initiated Contact with Patient 12/09/18 2321     (approximate)  I have reviewed the triage vital signs and the nursing notes.   HISTORY  Chief Complaint Chest Pain   HPI Jasmine Gordon is a 33 y.o. female who self presents to the emergency department reporting an asthma exacerbation.  She has a longstanding history of asthma and says for the past 24 hours or so she has had sharp upper chest pain and worsening shortness of breath.  She does have an inhaler at home but has not used it because she wants to save it for when she actually needs it.  She denies fevers or chills.  Her chest pain is sharp and aching and worse with deep breath.  She has a past medical history of asthma, anxiety, and morbid obesity.  She has had chronic chest pain shortness of breath and has been worked up for pulmonary embolism multiple times which is always been negative.  She denies fevers or chills.  She denies leg swelling.  She denies recent surgery travel or immobilization.  She denies hemoptysis.    Past Medical History:  Diagnosis Date  . Allergy   . Anemia   . Asthma   . Migraine   . Panic attack   . PTSD (post-traumatic stress disorder) 2016    Patient Active Problem List   Diagnosis Date Noted  . Genital warts 09/11/2018  . Positive TB test 09/25/2017  . Allergic contact dermatitis due to metals 05/17/2017  . Pre-diabetes 03/06/2017  . Hyperlipidemia 03/06/2017  . Allergic reaction 02/08/2017  . Urticaria due to food allergy 02/08/2017  . Vitamin D deficiency 11/08/2016  . Iron deficiency anemia due to chronic blood loss 10/03/2016  . Menorrhagia with regular cycle 06/27/2016  . Mild persistent asthma 06/17/2016  . Anxiety and depression 06/17/2016  . Morbid obesity with BMI of 40.0-44.9, adult ([place]) 06/17/2016    Past Surgical History:  Procedure  Laterality Date  . APPENDECTOMY    . Plantar warts      Prior to Admission medications   Medication Sig Start Date End Date Taking? Authorizing Provider  albuterol (PROVENTIL HFA;VENTOLIN HFA) 108 (90 Base) MCG/ACT inhaler Inhale 2 puffs into the lungs every 6 (six) hours as needed for wheezing or shortness of breath. 11/06/18  Yes Karamalegos, Alexander J, DO  albuterol (PROVENTIL) (2.5 MG/3ML) 0.083% nebulizer solution Take 3 mLs (2.5 mg total) by nebulization every 6 (six) hours as needed for wheezing or shortness of breath. 11/06/18  Yes Karamalegos, Devonne Doughty, DO  Cholecalciferol (VITAMIN D-1000 MAX ST) 25 MCG (1000 UT) tablet Take by mouth.   Yes [provider]  fluticasone (FLONASE) 50 MCG/ACT nasal spray Place 1 spray into both nostrils daily. 08/20/18 08/20/19 Yes Vallarie Mare M, PA-C  imiquimod (ALDARA) 5 % cream Apply topically 3 (three) times a week. Apply until total clearance or maximum of 12 weeks 0/0/92  Yes Copland, Deirdre Evener, PA-C  Respiratory Therapy Supplies (NEBULIZER/ADULT MASK) KIT Use nebulizer mask and supplies kit for nebulizer machine as needed 11/06/18  Yes Karamalegos, Devonne Doughty, DO  chlorhexidine (PERIDEX) 0.12 % solution RM WITH 1/2 CAPFUL FOR 30 SECONDS BID IN THE MORNING AND EVE AFTER BRUSHING TEETH 08/29/18   [provider]  clotrimazole-betamethasone (LOTRISONE) cream Apply externally BID prn sx up to 2 wks Patient not taking: Reported on 12/09/2018 33/00/76   Copland, Alicia B, PA-C  EPINEPHrine  0.3 mg/0.3 mL IJ SOAJ injection INJECT INTRAMUSCULARLY AS DIRECTED 01/29/17   [provider]  ibuprofen (ADVIL,MOTRIN) 600 MG tablet Take 1 tablet (600 mg total) by mouth every 8 (eight) hours as needed. 12/10/18   Darel Hong, MD  omeprazole (PRILOSEC) 10 MG capsule Take 1 capsule (10 mg total) by mouth daily. Patient not taking: Reported on 12/09/2018 09/17/18 09/17/19  Laban Emperor, PA-C  predniSONE (DELTASONE) 50 MG tablet Take 1  tablet (50 mg total) by mouth daily for 5 days. 12/10/18 12/15/18  Darel Hong, MD    Allergies Reglan [metoclopramide]  Family History  Problem Relation Age of Onset  . Depression Mother   . Mental retardation Mother   . Bipolar disorder Mother   . Diabetes Father   . Stroke Maternal Grandfather   . Diabetes Maternal Grandfather     Social History Social History   Tobacco Use  . Smoking status: Never Smoker  . Smokeless tobacco: Never Used  Substance Use Topics  . Alcohol use: No  . Drug use: No    Review of Systems Constitutional: No fever/chills Eyes: No visual changes. ENT: No sore throat. Cardiovascular: Positive for chest pain. Respiratory: Positive for shortness of breath. Gastrointestinal: No abdominal pain.  No nausea, no vomiting.  No diarrhea.  No constipation. Genitourinary: Negative for dysuria. Musculoskeletal: Negative for back pain. Skin: Negative for rash. Neurological: Negative for headaches, focal weakness or numbness.   ____________________________________________   PHYSICAL EXAM:  VITAL SIGNS: ED Triage Vitals [12/09/18 2110]  Enc Vitals Group     BP 127/74     Pulse Rate 94     Resp 20     Temp 98.8 F (37.1 C)     Temp Source Oral     SpO2 100 %     Weight 260 lb 3.2 oz (118 kg)     Height _0  (1.676 m)     Head Circumference      Peak Flow      Pain Score 7     Pain Loc      Pain Edu?      Excl. in [place]?     Constitutional: Alert and oriented x4 appears somewhat uncomfortable and obviously short of breath Eyes: PERRL EOMI. Head: Atraumatic. Nose: No congestion/rhinnorhea. Mouth/Throat: No trismus Neck: No stridor.  Able to lie completely flat with no JVD Cardiovascular: Normal rate, regular rhythm. Grossly normal heart sounds.  Good peripheral circulation. Respiratory: Increased respiratory effort with mild expiratory wheeze and prolonged expiratory phase throughout Gastrointestinal: Obese soft  nontender Musculoskeletal: No lower extremity edema legs are equal in size Neurologic:  Normal speech and language. No gross focal neurologic deficits are appreciated. Skin:  Skin is warm, dry and intact. No rash noted. Psychiatric: Mood and affect are normal. Speech and behavior are normal.    ____________________________________________   DIFFERENTIAL includes but not limited to  Pulmonary embolism, pneumonia, asthma exacerbation, pneumothorax, influenza ____________________________________________   LABS (all labs ordered are listed, but only abnormal results are displayed)  Labs Reviewed  CBC - Abnormal; Notable for the following components:      Result Value   Hemoglobin 11.7 (*)    All other components within normal limits  URINALYSIS, COMPLETE (UACMP) WITH MICROSCOPIC - Abnormal; Notable for the following components:   Color, Urine STRAW (*)    APPearance CLEAR (*)    Hgb urine dipstick SMALL (*)    All other components within normal limits  BASIC METABOLIC PANEL  TROPONIN I  INFLUENZA PANEL BY PCR (TYPE A & B)  BRAIN NATRIURETIC PEPTIDE  POC URINE PREG, ED  POCT PREGNANCY, URINE    Lab work reviewed by me with no acute disease noted __________________________________________  EKG  ED ECG REPORT I, Darel Hong, the attending physician, personally viewed and interpreted this ECG.  Date: 12/09/2018 EKG Time:  Rate: 88 Rhythm: normal sinus rhythm QRS Axis: normal Intervals: normal ST/T Wave abnormalities: normal Narrative Interpretation: no evidence of acute ischemia  ____________________________________________  RADIOLOGY  Chest x-ray reviewed by me consistent with reactive airway disease CT angiogram of the chest reviewed by me with no evidence of pulmonary embolism ____________________________________________   PROCEDURES  Procedure(s) performed: no  Procedures  Critical Care performed:  no  ____________________________________________   INITIAL IMPRESSION / ASSESSMENT AND PLAN / ED COURSE  Pertinent labs & imaging results that were available during my care of the patient were reviewed by me and considered in my medical decision making (see chart for details).   As part of my medical decision making, I reviewed the following data within the [place] History obtained from family if available, nursing notes, old chart and ekg, as well as notes from prior ED visits.  Patient comes to the emergency department with chest pain shortness of breath and wheezing.  She does not use her inhaler at home.  Given a breathing treatment here with improvement in her symptoms along with intravenous ketorolac with near complete resolution of her symptoms.  Of also given her Decadron to help with the asthma exacerbation.  She does have persistent shortness of breath however so I obtained a CT angiogram which is negative for pulmonary embolism.  I will discharge home with ibuprofen for pain and prednisone to complete 5 days of steroids for the asthma exacerbation.  Strict return precautions been given.      ____________________________________________   FINAL CLINICAL IMPRESSION(S) / ED DIAGNOSES  Final diagnoses:  Atypical chest pain  Mild intermittent asthma with acute exacerbation      NEW MEDICATIONS STARTED DURING THIS VISIT:  Discharge Medication List as of 12/10/2018  1:58 AM    START taking these medications   Details  ibuprofen (ADVIL,MOTRIN) 600 MG tablet Take 1 tablet (600 mg total) by mouth every 8 (eight) hours as needed., Starting Mon 12/10/2018, Print    predniSONE (DELTASONE) 50 MG tablet Take 1 tablet (50 mg total) by mouth daily for 5 days., Starting Mon 12/10/2018, Until Sat 12/15/2018, Print         Note:  This document was prepared using Dragon voice recognition software and may include unintentional dictation errors.    Darel Hong,  MD 12/11/18 [phone]

## 2018-12-09 NOTE — ED Triage Notes (Signed)
Pt arrives angrily to triage with c/o chest pain and asthma. Pt states that she had an inhaler at home but tries to save that. Pt is speaking in full sentences and is in NAD.

## 2018-12-09 NOTE — ED Notes (Signed)
Patient ambulatory to stat desk with steady gait with no acute distress noted speaking in complete sentences.  Patient reports "think I'm having an allergic reaction".  Patient also reports chest pain "I have asthma".

## 2018-12-10 ENCOUNTER — Emergency Department
Admission: EM | Admit: 2018-12-10 | Discharge: 2018-12-10 | Disposition: A | Discharge status: Home / Self Care | Payer: Medicaid Other | Source: Home / Self Care | Attending: Emergency Medicine | Admitting: Emergency Medicine

## 2018-12-10 ENCOUNTER — Other Ambulatory Visit: Payer: Self-pay

## 2018-12-10 ENCOUNTER — Encounter: Payer: Self-pay | Admitting: Radiology

## 2018-12-10 ENCOUNTER — Emergency Department: Payer: Medicaid Other

## 2018-12-10 DIAGNOSIS — T50905A Adverse effect of unspecified drugs, medicaments and biological substances, initial encounter: Secondary | ICD-10-CM

## 2018-12-10 DIAGNOSIS — R203 Hyperesthesia: Secondary | ICD-10-CM | POA: Insufficient documentation

## 2018-12-10 DIAGNOSIS — R202 Paresthesia of skin: Secondary | ICD-10-CM

## 2018-12-10 DIAGNOSIS — E785 Hyperlipidemia, unspecified: Secondary | ICD-10-CM

## 2018-12-10 DIAGNOSIS — J45909 Unspecified asthma, uncomplicated: Secondary | ICD-10-CM

## 2018-12-10 LAB — BRAIN NATRIURETIC PEPTIDE: B Natriuretic Peptide: 11 pg/mL (ref 0.0–100.0)

## 2018-12-10 LAB — INFLUENZA PANEL BY PCR (TYPE A & B)
Influenza A By PCR: NEGATIVE
Influenza B By PCR: NEGATIVE

## 2018-12-10 MED ORDER — IBUPROFEN 600 MG PO TABS: 600.0000 mg | ORAL_TABLET | Freq: Three times a day (TID) | ORAL | 0 refills | Status: DC | PRN

## 2018-12-10 MED ORDER — IOHEXOL 350 MG/ML SOLN
75.0000 mL | Freq: Once | INTRAVENOUS | Status: AC | PRN
  Administered 2018-12-10: 75 mL via INTRAVENOUS

## 2018-12-10 MED ORDER — PREDNISONE 50 MG PO TABS: 50.0000 mg | ORAL_TABLET | Freq: Every day | ORAL | 0 refills | Status: AC

## 2018-12-10 NOTE — ED Provider Notes (Signed)
[facility] Emergency Department Provider Note   ____________________________________________    I have reviewed the triage vital signs and the nursing notes.   HISTORY  Chief Complaint Spasms     HPI Shauniece Aadvika Gordon is a 33 y.o. female who presents with complaints of tingling in her upper extremities and lower extremities.  Patient reports when she woke up this morning she noted that she had tingling from the elbows down in both upper extremities.  On the left it seems to affect her fourth and fifth fingers and on the right it affects her thumb.  She also has it in her calves.  Was here last night had extensive work-up for shortness of breath.  She reports her breathing is feeling well.  She has no pain.  Review of records demonstrates the patient received IV Decadron  Past Medical History:  Diagnosis Date  . Allergy   . Anemia   . Asthma   . Migraine   . Panic attack   . PTSD (post-traumatic stress disorder) 2016    Patient Active Problem List   Diagnosis Date Noted  . Genital warts 09/11/2018  . Positive TB test 09/25/2017  . Allergic contact dermatitis due to metals 05/17/2017  . Pre-diabetes 03/06/2017  . Hyperlipidemia 03/06/2017  . Allergic reaction 02/08/2017  . Urticaria due to food allergy 02/08/2017  . Vitamin D deficiency 11/08/2016  . Iron deficiency anemia due to chronic blood loss 10/03/2016  . Menorrhagia with regular cycle 06/27/2016  . Mild persistent asthma 06/17/2016  . Anxiety and depression 06/17/2016  . Morbid obesity with BMI of 40.0-44.9, adult ([place]) 06/17/2016    Past Surgical History:  Procedure Laterality Date  . APPENDECTOMY    . Plantar warts      Prior to Admission medications   Medication Sig Start Date End Date Taking? Authorizing Provider  albuterol (PROVENTIL HFA;VENTOLIN HFA) 108 (90 Base) MCG/ACT inhaler Inhale 2 puffs into the lungs every 6 (six) hours as needed for wheezing or shortness of  breath. 11/06/18   Karamalegos, Devonne Doughty, DO  albuterol (PROVENTIL) (2.5 MG/3ML) 0.083% nebulizer solution Take 3 mLs (2.5 mg total) by nebulization every 6 (six) hours as needed for wheezing or shortness of breath. 11/06/18   Karamalegos, Devonne Doughty, DO  chlorhexidine (PERIDEX) 0.12 % solution RM WITH 1/2 CAPFUL FOR 30 SECONDS BID IN THE MORNING AND EVE AFTER BRUSHING TEETH 08/29/18   [provider]  Cholecalciferol (VITAMIN D-1000 MAX ST) 25 MCG (1000 UT) tablet Take by mouth.    [provider]  clotrimazole-betamethasone (LOTRISONE) cream Apply externally BID prn sx up to 2 wks Patient not taking: Reported on 12/09/2018 11/65/79   Copland, Alicia B, PA-C  EPINEPHrine 0.3 mg/0.3 mL IJ SOAJ injection INJECT INTRAMUSCULARLY AS DIRECTED 01/29/17   [provider]  fluticasone (FLONASE) 50 MCG/ACT nasal spray Place 1 spray into both nostrils daily. 08/20/18 08/20/19  Lannie Fields, PA-C  ibuprofen (ADVIL,MOTRIN) 600 MG tablet Take 1 tablet (600 mg total) by mouth every 8 (eight) hours as needed. 12/10/18   Darel Hong, MD  imiquimod [place]) 5 % cream Apply topically 3 (three) times a week. Apply until total clearance or maximum of 12 weeks 0/3/83   Copland, Alicia B, PA-C  omeprazole (PRILOSEC) 10 MG capsule Take 1 capsule (10 mg total) by mouth daily. Patient not taking: Reported on 12/09/2018 09/17/18 09/17/19  Laban Emperor, PA-C  predniSONE (DELTASONE) 50 MG tablet Take 1 tablet (50 mg total) by  mouth daily for 5 days. 12/10/18 12/15/18  Darel Hong, MD  Respiratory Therapy Supplies (NEBULIZER/ADULT MASK) KIT Use nebulizer mask and supplies kit for nebulizer machine as needed 11/06/18   [name], DO     Allergies Reglan [metoclopramide]  Family History  Problem Relation Age of Onset  . Depression Mother   . Mental retardation Mother   . Bipolar disorder Mother   . Diabetes Father   . Stroke Maternal Grandfather   . Diabetes Maternal  Grandfather     Social History Social History   Tobacco Use  . Smoking status: Never Smoker  . Smokeless tobacco: Never Used  Substance Use Topics  . Alcohol use: No  . Drug use: No    Review of Systems  Constitutional: No fever/chills Eyes: No visual changes.  ENT: No throat swelling Cardiovascular: Denies chest pain. Respiratory: Denies shortness of breath. Gastrointestinal: No abdominal pain.  No nausea, no vomiting.   Genitourinary: Negative for dysuria. Musculoskeletal: Negative for back pain. Skin: No rash Neurological: Negative for headaches, as above   ____________________________________________   PHYSICAL EXAM:  VITAL SIGNS: ED Triage Vitals  Enc Vitals Group     BP 12/10/18 1155 137/79     Pulse Rate 12/10/18 1155 100     Resp --      Temp 12/10/18 1155 98.6 F (37 C)     Temp Source 12/10/18 1155 Oral     SpO2 12/10/18 1155 98 %     Weight 12/10/18 1155 116.6 kg (257 lb)     Height 12/10/18 1155 1.676 m (_0 )     Head Circumference --      Peak Flow --      Pain Score 12/10/18 1203 0     Pain Loc --      Pain Edu? --      Excl. in [place]? --     Constitutional: Alert and oriented. No acute distress. Eyes: Conjunctivae are normal.   Nose: No congestion/rhinnorhea. Mouth/Throat: Mucous membranes are moist.    Cardiovascular: Normal rate, regular rhythm.  Good peripheral circulation. Respiratory: Normal respiratory effort.  No retractions.    Musculoskeletal: No lower extremity tenderness nor edema.  Warm and well perfused.  Normal strength in all extremities, unremarkable exam, no swelling.  No pallor.   Neurologic:  Normal speech and language. No gross focal neurologic deficits are appreciated.  Normal neurologic exam Skin:  Skin is warm, dry and intact. No rash noted. Psychiatric: Mood and affect are normal. Speech and behavior are normal.  ____________________________________________   LABS (all labs ordered are listed, but only  abnormal results are displayed)  Labs Reviewed - No data to display ____________________________________________  EKG   ____________________________________________  RADIOLOGY   ____________________________________________   PROCEDURES  Procedure(s) performed: No  Procedures   Critical Care performed: No ____________________________________________   INITIAL IMPRESSION / ASSESSMENT AND PLAN / ED COURSE  Pertinent labs & imaging results that were available during my care of the patient were reviewed by me and considered in my medical decision making (see chart for details).  Patient well-appearing with reassuring exam.  Overall she feels quite well.  I suspect tingling is side effect likely of Decadron.  Recommended supportive care, outpatient follow-up if no significant provement in the next 1 to 2 days    ____________________________________________   FINAL CLINICAL IMPRESSION(S) / ED DIAGNOSES  Final diagnoses:  Medication side effect, initial encounter  Tingling in extremities        Note:  This document was prepared using Dragon voice recognition software and may include unintentional dictation errors.   Lavonia Drafts, MD 12/10/18 [phone]

## 2018-12-10 NOTE — ED Triage Notes (Signed)
First Nurse Note:  Arrives c/o feeling arms are tingling.  States was seen last night through ED and given prednisone.  States woke up this morning and felt like arms were tingling and skin was 'tight' over arms.  AAOx3.  Skin warm and dry.  MAE equally and strong.  NAD

## 2018-12-10 NOTE — ED Notes (Signed)
Peripheral IV discontinued. Catheter intact. No signs of infiltration or redness. Gauze applied to IV site.   Discharge instructions reviewed with patient. Questions fielded by this RN. Patient verbalizes understanding of instructions. Patient discharged home in stable condition per rifenbark. No acute distress noted at time of discharge.    

## 2018-12-10 NOTE — Discharge Instructions (Signed)
Fortunately today your chest x-ray, your blood work, and your CT scan were reassuring and you do not have pneumonia and you do not have a blood clot.  Please take your prednisone as prescribed for 5 days and follow-up with your primary care physician as needed.  Return to the emergency department sooner for any concerns.  It was a pleasure to take care of you today, and thank you for coming to our emergency department.  If you have any questions or concerns before leaving please ask the nurse to grab me and I'm more than happy to go through your aftercare instructions again.  If you were prescribed any opioid pain medication today such as Norco, Vicodin, Percocet, morphine, hydrocodone, or oxycodone please make sure you do not drive when you are taking this medication as it can alter your ability to drive safely.  If you have any concerns once you are home that you are not improving or are in fact getting worse before you can make it to your follow-up appointment, please do not hesitate to call 911 and come back for further evaluation.  Merrily Brittle, MD  Results for orders placed or performed during the hospital encounter of 12/09/18  Basic metabolic panel  Result Value Ref Range   Sodium 137 135 - 145 mmol/L   Potassium 3.8 3.5 - 5.1 mmol/L   Chloride 107 98 - 111 mmol/L   CO2 24 22 - 32 mmol/L   Glucose, Bld 99 70 - 99 mg/dL   BUN 13 6 - 20 mg/dL   Creatinine, Ser 8.01 0.44 - 1.00 mg/dL   Calcium 9.3 8.9 - 65.5 mg/dL   GFR calc non Af Amer >60 >60 mL/min   GFR calc Af Amer >60 >60 mL/min   Anion gap 6 5 - 15  CBC  Result Value Ref Range   WBC 5.3 4.0 - 10.5 K/uL   RBC 4.44 3.87 - 5.11 MIL/uL   Hemoglobin 11.7 (L) 12.0 - 15.0 g/dL   HCT 37.4 82.7 - 07.8 %   MCV 84.7 80.0 - 100.0 fL   MCH 26.4 26.0 - 34.0 pg   MCHC 31.1 30.0 - 36.0 g/dL   RDW 67.5 44.9 - 20.1 %   Platelets 217 150 - 400 K/uL   nRBC 0.0 0.0 - 0.2 %  Troponin I - ONCE - STAT  Result Value Ref Range   Troponin I  <0.03 <0.03 ng/mL  Urinalysis, Complete w Microscopic  Result Value Ref Range   Color, Urine STRAW (A) YELLOW   APPearance CLEAR (A) CLEAR   Specific Gravity, Urine 1.010 1.005 - 1.030   pH 6.0 5.0 - 8.0   Glucose, UA NEGATIVE NEGATIVE mg/dL   Hgb urine dipstick SMALL (A) NEGATIVE   Bilirubin Urine NEGATIVE NEGATIVE   Ketones, ur NEGATIVE NEGATIVE mg/dL   Protein, ur NEGATIVE NEGATIVE mg/dL   Nitrite NEGATIVE NEGATIVE   Leukocytes, UA NEGATIVE NEGATIVE   RBC / HPF 0-5 0 - 5 RBC/hpf   WBC, UA 0-5 0 - 5 WBC/hpf   Bacteria, UA NONE SEEN NONE SEEN   Squamous Epithelial / LPF 0-5 0 - 5   Mucus PRESENT   Influenza panel by PCR (type A & B)  Result Value Ref Range   Influenza A By PCR NEGATIVE NEGATIVE   Influenza B By PCR NEGATIVE NEGATIVE  Brain natriuretic peptide  Result Value Ref Range   B Natriuretic Peptide 11.0 0.0 - 100.0 pg/mL  Pregnancy, urine POC  Result Value  Ref Range   Preg Test, Ur NEGATIVE NEGATIVE   Dg Chest 2 View  Result Date: 12/09/2018 CLINICAL DATA:  Initial evaluation for acute chest pain, history of asthma, shortness of breath. EXAM: CHEST - 2 VIEW COMPARISON:  Prior radiograph and CT from 07/26/2018. FINDINGS: The cardiac and mediastinal silhouettes are stable in size and contour, and remain within normal limits. The lungs are normally inflated. Mild diffuse scattered peribronchial thickening. No airspace consolidation, pleural effusion, or pulmonary edema is identified. There is no pneumothorax. No acute osseous abnormality identified. IMPRESSION: Scattered peribronchial thickening, most likely related to provided history of asthma/reactive airways disease. Acute bronchiolitis could be considered in the correct clinical setting. No focal infiltrates to suggest bronchopneumonia. Electronically Signed   By: Rise Mu M.D.   On: 12/09/2018 22:28   Ct Angio Chest Pe W/cm &/or Wo Cm  Result Date: 12/10/2018 CLINICAL DATA:  Initial evaluation for acute  chest pain, history of asthma. EXAM: CT ANGIOGRAPHY CHEST WITH CONTRAST TECHNIQUE: Multidetector CT imaging of the chest was performed using the standard protocol during bolus administration of intravenous contrast. Multiplanar CT image reconstructions and MIPs were obtained to evaluate the vascular anatomy. CONTRAST:  81mL OMNIPAQUE IOHEXOL 350 MG/ML SOLN COMPARISON:  Prior radiograph from earlier the same day. FINDINGS: Cardiovascular: Intrathoracic aorta of normal caliber without aneurysm or other acute finding. Visualized great vessels within normal limits. Heart size normal. No pericardial effusion. Pulmonary arterial tree adequately opacified for evaluation. Main pulmonary artery within normal limits for caliber. No filling defect to suggest acute pulmonary embolism. Re-formatted imaging confirms these findings. Mediastinum/Nodes: Visualized thyroid within normal limits. No pathologically enlarged mediastinal, hilar, or axillary lymph nodes. Esophagus within normal limits. Lungs/Pleura: Tracheobronchial tree intact and patent. Lungs well inflated bilaterally. Minimal hazy subsegmental atelectatic changes seen dependently within the lower lobes bilaterally. No focal infiltrates. No edema or effusion. No pneumothorax. No worrisome pulmonary nodule or mass. Upper Abdomen: Visualized upper abdomen demonstrates no acute finding. Musculoskeletal: No acute osseous abnormality. No discrete lytic or blastic osseous lesions. External soft tissues demonstrate no acute finding. Review of the MIP images confirms the above findings. IMPRESSION: 1. No CTA evidence for acute pulmonary embolism. 2. No other acute cardiopulmonary abnormality identified. Electronically Signed   By: Rise Mu M.D.   On: 12/10/2018 01:21

## 2018-12-10 NOTE — ED Triage Notes (Signed)
Pt c/o tightness from the elbow to hand and from knee to foot since waking this morning states she was seen here last night for asthma and given steroids and did not know it is due to the steroids or something else. Denies any issues with breathing today, states that feels normal

## 2018-12-18 ENCOUNTER — Encounter: Payer: Self-pay | Admitting: Emergency Medicine

## 2018-12-18 ENCOUNTER — Emergency Department: Payer: Medicaid Other

## 2018-12-18 ENCOUNTER — Emergency Department
Admission: EM | Admit: 2018-12-18 | Discharge: 2018-12-18 | Disposition: A | Discharge status: Home / Self Care | Payer: Medicaid Other | Attending: Emergency Medicine | Admitting: Emergency Medicine

## 2018-12-18 ENCOUNTER — Other Ambulatory Visit: Payer: Self-pay

## 2018-12-18 DIAGNOSIS — S0990XA Unspecified injury of head, initial encounter: Secondary | ICD-10-CM | POA: Diagnosis not present

## 2018-12-18 DIAGNOSIS — J45909 Unspecified asthma, uncomplicated: Secondary | ICD-10-CM | POA: Diagnosis not present

## 2018-12-18 DIAGNOSIS — G44311 Acute post-traumatic headache, intractable: Secondary | ICD-10-CM

## 2018-12-18 DIAGNOSIS — Z79899 Other long term (current) drug therapy: Secondary | ICD-10-CM | POA: Insufficient documentation

## 2018-12-18 DIAGNOSIS — R51 Headache: Secondary | ICD-10-CM | POA: Diagnosis not present

## 2018-12-18 MED ORDER — NAPROXEN 500 MG PO TABS: 500.0000 mg | ORAL_TABLET | Freq: Two times a day (BID) | ORAL | 0 refills | Status: DC

## 2018-12-18 MED ORDER — NAPROXEN 500 MG PO TABS
500.0000 mg | ORAL_TABLET | Freq: Once | ORAL | Status: AC
  Administered 2018-12-18: 500 mg via ORAL
  Filled 2018-12-18: qty 1

## 2018-12-18 NOTE — ED Triage Notes (Addendum)
Pt c/o neck and head pain xfew days. PT states she had a mechanical fall x2 wks ago and had same pain but went away and now is back. PT ambulatory, VSS

## 2018-12-18 NOTE — Discharge Instructions (Signed)
Please follow-up with your primary care provider for symptoms that are not improving over the next 2 to 3 days.  Return to the emergency department for symptoms of change or worsen if unable to schedule an appointment.

## 2018-12-18 NOTE — ED Provider Notes (Signed)
[facility] Emergency Department Provider Note ____________________________________________  Time seen: Approximately 11:04 PM  I have reviewed the triage vital signs and the nursing notes.   HISTORY  Chief Complaint Fall   HPI Jasmine Gordon is a 33 y.o. female who presents to the emergency department for treatment and evaluation for head and neck pain.  She had a mechanical non-syncopal fall 2 weeks ago after stepping on the back edge of a skateboard.  Her feet flew out from underneath her and she landed on her back and struck her head on the ground.   States that the headache was there the following day, but went away until about 2 days ago.  She states that the headache is now constant and throbbing.  Location: Posterior Similar to previous headaches: No Duration: 3 days TIMING: Constant SEVERITY: 6/10 QUALITY: Throb CONTEXT: 2 weeks post fall MODIFYING FACTORS: None ASSOCIATED SYMPTOMS: None Past Medical History:  Diagnosis Date  . Allergy   . Anemia   . Asthma   . Migraine   . Panic attack   . PTSD (post-traumatic stress disorder) 2016    Patient Active Problem List   Diagnosis Date Noted  . Genital warts 09/11/2018  . Positive TB test 09/25/2017  . Allergic contact dermatitis due to metals 05/17/2017  . Pre-diabetes 03/06/2017  . Hyperlipidemia 03/06/2017  . Allergic reaction 02/08/2017  . Urticaria due to food allergy 02/08/2017  . Vitamin D deficiency 11/08/2016  . Iron deficiency anemia due to chronic blood loss 10/03/2016  . Menorrhagia with regular cycle 06/27/2016  . Mild persistent asthma 06/17/2016  . Anxiety and depression 06/17/2016  . Morbid obesity with BMI of 40.0-44.9, adult ([place]) 06/17/2016    Past Surgical History:  Procedure Laterality Date  . APPENDECTOMY    . Plantar warts      Prior to Admission medications   Medication Sig Start Date End Date Taking? Authorizing Provider  albuterol (PROVENTIL  HFA;VENTOLIN HFA) 108 (90 Base) MCG/ACT inhaler Inhale 2 puffs into the lungs every 6 (six) hours as needed for wheezing or shortness of breath. 11/06/18   Karamalegos, Devonne Doughty, DO  albuterol (PROVENTIL) (2.5 MG/3ML) 0.083% nebulizer solution Take 3 mLs (2.5 mg total) by nebulization every 6 (six) hours as needed for wheezing or shortness of breath. 11/06/18   Karamalegos, Devonne Doughty, DO  chlorhexidine (PERIDEX) 0.12 % solution RM WITH 1/2 CAPFUL FOR 30 SECONDS BID IN THE MORNING AND EVE AFTER BRUSHING TEETH 08/29/18   [provider]  Cholecalciferol (VITAMIN D-1000 MAX ST) 25 MCG (1000 UT) tablet Take by mouth.    [provider]  clotrimazole-betamethasone (LOTRISONE) cream Apply externally BID prn sx up to 2 wks Patient not taking: Reported on 12/09/2018 40/08/67   Copland, Alicia B, PA-C  EPINEPHrine 0.3 mg/0.3 mL IJ SOAJ injection INJECT INTRAMUSCULARLY AS DIRECTED 01/29/17   [provider]  fluticasone (FLONASE) 50 MCG/ACT nasal spray Place 1 spray into both nostrils daily. 08/20/18 08/20/19  Lannie Fields, PA-C  ibuprofen (ADVIL,MOTRIN) 600 MG tablet Take 1 tablet (600 mg total) by mouth every 8 (eight) hours as needed. 12/10/18   Darel Hong, MD  imiquimod Leroy Sea) 5 % cream Apply topically 3 (three) times a week. Apply until total clearance or maximum of 12 weeks 04/21/94   Copland, Alicia B, PA-C  naproxen (NAPROSYN) 500 MG tablet Take 1 tablet (500 mg total) by mouth 2 (two) times daily with a meal. 12/18/18   [place] Cookson B, FNP  omeprazole (  PRILOSEC) 10 MG capsule Take 1 capsule (10 mg total) by mouth daily. Patient not taking: Reported on 12/09/2018 09/17/18 09/17/19  Laban Emperor, PA-C  Respiratory Therapy Supplies (NEBULIZER/ADULT MASK) KIT Use nebulizer mask and supplies kit for nebulizer machine as needed 11/06/18   [name], DO    Allergies Reglan [metoclopramide]  Family History  Problem Relation Age of Onset  . Depression  Mother   . Mental retardation Mother   . Bipolar disorder Mother   . Diabetes Father   . Stroke Maternal Grandfather   . Diabetes Maternal Grandfather     Social History Social History   Tobacco Use  . Smoking status: Never Smoker  . Smokeless tobacco: Never Used  Substance Use Topics  . Alcohol use: No  . Drug use: No    Review of Systems Constitutional: No fever/chills or recent injury. Eyes: No visual changes. ENT: No sore throat. Respiratory: Denies shortness of breath. Gastrointestinal: No abdominal pain.  No nausea, no vomiting.  No diarrhea.  No constipation. Musculoskeletal: Negative for pain. Skin: Negative for rash. Neurological:Positive for headache, negative for focal weakness or numbness. No confusion or fainting. ___________________________________________   PHYSICAL EXAM:  VITAL SIGNS: ED Triage Vitals  Enc Vitals Group     BP 12/18/18 1744 (!) 143/80     Pulse Rate 12/18/18 1744 100     Resp 12/18/18 1744 16     Temp 12/18/18 1744 98.6 F (37 C)     Temp Source 12/18/18 1744 Oral     SpO2 12/18/18 1744 100 %     Weight --      Height --      Head Circumference --      Peak Flow --      Pain Score 12/18/18 1745 8     Pain Loc --      Pain Edu? --      Excl. in [place]? --     Constitutional: Alert and oriented. Well appearing and in no acute distress. Eyes: Conjunctivae are normal. PERRL. EOMI without expressed pain. No evidence of papilledema on limited exam. Head: Atraumatic. Nose: No congestion/rhinnorhea. Mouth/Throat: Mucous membranes are moist.  Oropharynx non-erythematous. Neck: No stridor. Supple, no meningismus.  Cardiovascular: Normal rate, regular rhythm. Grossly normal heart sounds.  Good peripheral circulation. Respiratory: Normal respiratory effort.  No retractions. Lungs CTAB. Gastrointestinal: Soft and nontender. No distention.  Musculoskeletal: No lower extremity tenderness nor edema.  No joint effusions. Neurologic:  Normal  speech and language. No gross focal neurologic deficits are appreciated. No gait instability. Cranial nerves: 2-10 normal as tested. Cerebellar:Normal Romberg, finger-nose-finger, heel to shin, normal gait. Sensorimotor: No aphasia, pronator drift, clonus, sensory loss or abnormal reflexes.  Skin:  Skin is warm, dry and intact. No rash noted. Psychiatric: Mood and affect are normal. Speech and behavior are normal. Normal thought process and cognition.  ____________________________________________   LABS (all labs ordered are listed, but only abnormal results are displayed)  Labs Reviewed - No data to display ____________________________________________  EKG  Not indicated. ____________________________________________  RADIOLOGY  Ct Head Wo Contrast  Result Date: 12/18/2018 CLINICAL DATA:  33 year old female with continued headache following fall 2 weeks ago. EXAM: CT HEAD WITHOUT CONTRAST TECHNIQUE: Contiguous axial images were obtained from the base of the skull through the vertex without intravenous contrast. COMPARISON:  05/05/2018 CT FINDINGS: Brain: No evidence of acute infarction, hemorrhage, hydrocephalus, extra-axial collection or mass lesion/mass effect. Vascular: No hyperdense vessel or unexpected calcification. Skull: Normal. Negative for  fracture or focal lesion. Sinuses/Orbits: No acute finding. Other: None. IMPRESSION: Unremarkable noncontrast head CT. Electronically Signed   By: Margarette Canada M.D.   On: 12/18/2018 19:09   ____________________________________________   PROCEDURES  Procedure(s) performed:  Procedures  Critical Care performed: None ____________________________________________   INITIAL IMPRESSION / ASSESSMENT AND PLAN / ED COURSE  33 year old female presenting to the emergency department 2 weeks after mechanical non-syncopal fall.  She initially had a headache that resolved without intervention.  Headache returned 3 days ago.  She has not attempted any  alleviating measures at home.  CT of the head is negative.  She will be treated with Naprosyn 2 times per day.  She was encouraged to follow-up with her primary care provider if not improving over the next few days.  She was instructed to return to the emergency department for symptoms of change or worsen if unable to schedule an appointment.  Pertinent labs & imaging results that were available during my care of the patient were reviewed by me and considered in my medical decision making (see chart for details). ____________________________________________   FINAL CLINICAL IMPRESSION(S) / ED DIAGNOSES  Final diagnoses:  Minor head injury, initial encounter  Intractable acute post-traumatic headache    ED Discharge Orders         Ordered    naproxen (NAPROSYN) 500 MG tablet  2 times daily with meals,   Status:  Discontinued     12/18/18 1919    naproxen (NAPROSYN) 500 MG tablet  2 times daily with meals     12/18/18 1926            Victorino Dike, FNP 12/18/18 [place], [place], MD 12/18/18 [id number]

## 2018-12-18 NOTE — ED Notes (Signed)
Patient transported to CT 

## 2019-01-24 ENCOUNTER — Other Ambulatory Visit: Payer: BLUE CROSS/BLUE SHIELD

## 2019-01-24 ENCOUNTER — Ambulatory Visit: Payer: Medicaid Other | Admitting: Oncology

## 2019-01-24 ENCOUNTER — Ambulatory Visit: Payer: BLUE CROSS/BLUE SHIELD

## 2019-01-25 ENCOUNTER — Other Ambulatory Visit: Payer: Self-pay

## 2019-01-25 DIAGNOSIS — D500 Iron deficiency anemia secondary to blood loss (chronic): Secondary | ICD-10-CM

## 2019-01-28 ENCOUNTER — Inpatient Hospital Stay: Payer: Medicaid Other | Attending: Oncology

## 2019-01-28 ENCOUNTER — Other Ambulatory Visit: Payer: Self-pay

## 2019-01-28 DIAGNOSIS — Z791 Long term (current) use of non-steroidal anti-inflammatories (NSAID): Secondary | ICD-10-CM | POA: Diagnosis not present

## 2019-01-28 DIAGNOSIS — D508 Other iron deficiency anemias: Secondary | ICD-10-CM | POA: Insufficient documentation

## 2019-01-28 DIAGNOSIS — Z79899 Other long term (current) drug therapy: Secondary | ICD-10-CM | POA: Insufficient documentation

## 2019-01-28 DIAGNOSIS — D500 Iron deficiency anemia secondary to blood loss (chronic): Secondary | ICD-10-CM

## 2019-01-28 LAB — CBC WITH DIFFERENTIAL/PLATELET
Abs Immature Granulocytes: 0.01 10*3/uL (ref 0.00–0.07)
Basophils Absolute: 0 10*3/uL (ref 0.0–0.1)
Basophils Relative: 0 %
Eosinophils Absolute: 0.1 10*3/uL (ref 0.0–0.5)
Eosinophils Relative: 1 %
HCT: 36.8 % (ref 36.0–46.0)
Hemoglobin: 11.7 g/dL — ABNORMAL LOW (ref 12.0–15.0)
Immature Granulocytes: 0 %
Lymphocytes Relative: 44 %
Lymphs Abs: 1.9 10*3/uL (ref 0.7–4.0)
MCH: 26.9 pg (ref 26.0–34.0)
MCHC: 31.8 g/dL (ref 30.0–36.0)
MCV: 84.6 fL (ref 80.0–100.0)
Monocytes Absolute: 0.2 10*3/uL (ref 0.1–1.0)
Monocytes Relative: 5 %
Neutro Abs: 2.2 10*3/uL (ref 1.7–7.7)
Neutrophils Relative %: 50 %
Platelets: 206 10*3/uL (ref 150–400)
RBC: 4.35 MIL/uL (ref 3.87–5.11)
RDW: 13.2 % (ref 11.5–15.5)
WBC: 4.4 10*3/uL (ref 4.0–10.5)
nRBC: 0 % (ref 0.0–0.2)

## 2019-01-28 LAB — FERRITIN: Ferritin: 55 ng/mL (ref 11–307)

## 2019-01-28 LAB — IRON AND TIBC
Iron: 55 ug/dL (ref 28–170)
Saturation Ratios: 19 % (ref 10.4–31.8)
TIBC: 293 ug/dL (ref 250–450)
UIBC: 238 ug/dL

## 2019-01-29 ENCOUNTER — Inpatient Hospital Stay: Payer: Medicaid Other

## 2019-01-29 ENCOUNTER — Inpatient Hospital Stay (HOSPITAL_BASED_OUTPATIENT_CLINIC_OR_DEPARTMENT_OTHER): Payer: Medicaid Other | Admitting: Oncology

## 2019-01-29 ENCOUNTER — Other Ambulatory Visit: Payer: Self-pay

## 2019-01-29 ENCOUNTER — Encounter: Payer: Self-pay | Admitting: Oncology

## 2019-01-29 VITALS — BP 129/78 | HR 118 | Temp 97.2°F | Resp 18 | Wt 263.8 lb

## 2019-01-29 DIAGNOSIS — D500 Iron deficiency anemia secondary to blood loss (chronic): Secondary | ICD-10-CM

## 2019-01-29 DIAGNOSIS — Z791 Long term (current) use of non-steroidal anti-inflammatories (NSAID): Secondary | ICD-10-CM | POA: Diagnosis not present

## 2019-01-29 DIAGNOSIS — D508 Other iron deficiency anemias: Secondary | ICD-10-CM | POA: Diagnosis not present

## 2019-01-29 DIAGNOSIS — Z79899 Other long term (current) drug therapy: Secondary | ICD-10-CM | POA: Diagnosis not present

## 2019-01-29 NOTE — Progress Notes (Signed)
Patient here for follow up. Transferring care from Dr. Merlene Pulling to Dr. Cathie Hoops. Pts pulse elevated, she states she just took her steroid inhaler. No concerns voiced.

## 2019-01-29 NOTE — Progress Notes (Signed)
[facility] day:  01/29/2019  Chief Complaint: Jasmine Gordon is a 33 y.o. female with iron deficiency anemia who is seen for 15 month reassessment and re-initiation of IV iron.  PERTINENT HEMATOLOGY HISTORY She followed up with Dr.Corcoran. switched care to me on 01/29/2019  iron deficiency anemia likely due to heavy menses.  Menses improved after IUD placement.  Diet appears good.  She denies any melena, hematochezia, or hematuria.  Urinalysis reveals no blood.  She received Venofer weekly x 3 (04/27/2017 - 05/10/2017).  CBC on 09/06/2017 revealed a hematocrit of 35.8, hemoglobin 11.9 and MCV 81.6. Chest CT angiogram on 07/26/2018 revealed no evidence of pulmonary embolism.  There was no evidence of active pulmonary disease.  INTERVAL HISTORY Jasmine Gordon is a 33 y.o. female who has above history reviewed by me today presents for follow up visit for management of iron deficiency anemia.   During the interval, she tells me that IUD has been removed and currently she has light flow for each periods.  Fatigue has improved.   Review of Systems  Constitutional: Negative for appetite change, chills, fatigue and fever.  HENT:   Negative for hearing loss and voice change.   Eyes: Negative for eye problems.  Respiratory: Negative for chest tightness and cough.   Cardiovascular: Negative for chest pain.  Gastrointestinal: Negative for abdominal distention, abdominal pain and blood in stool.  Endocrine: Negative for hot flashes.  Genitourinary: Negative for difficulty urinating and frequency.   Musculoskeletal: Negative for arthralgias.  Skin: Negative for itching and rash.  Neurological: Negative for extremity weakness.  Hematological: Negative for adenopathy.  Psychiatric/Behavioral: Negative for confusion.     Past Medical History:  Diagnosis Date  . Allergy   . Anemia   . Asthma   . Migraine   . Panic attack   . PTSD  (post-traumatic stress disorder) 2016    Past Surgical History:  Procedure Laterality Date  . APPENDECTOMY    . Plantar warts      Family History  Problem Relation Age of Onset  . Depression Mother   . Mental retardation Mother   . Bipolar disorder Mother   . Diabetes Father   . Stroke Maternal Grandfather   . Diabetes Maternal Grandfather    Social History   Socioeconomic History  . Marital status: Married    Spouse name: Not on file  . Number of children: Not on file  . Years of education: Not on file  . Highest education level: Not on file  Occupational History  . Not on file  Social Needs  . Financial resource strain: Not on file  . Food insecurity:    Worry: Not on file    Inability: Not on file  . Transportation needs:    Medical: Not on file    Non-medical: Not on file  Tobacco Use  . Smoking status: Never Smoker  . Smokeless tobacco: Never Used  Substance and Sexual Activity  . Alcohol use: No  . Drug use: No  . Sexual activity: Yes    Birth control/protection: Condom  Lifestyle  . Physical activity:    Days per week: Not on file    Minutes per session: Not on file  . Stress: Not on file  Relationships  . Social connections:    Talks on phone: Not on file    Gets together: Not on file    Attends religious service: Not on file  Active member of club or organization: Not on file    Attends meetings of clubs or organizations: Not on file    Relationship status: Not on file  . Intimate partner violence:    Fear of current or ex partner: Not on file    Emotionally abused: Not on file    Physically abused: Not on file    Forced sexual activity: Not on file  Other Topics Concern  . Not on file  Social History Narrative  . 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  She works 10 hours/day.  She is off 3 days/week.  She has 2 children with ADHD. She is going to  the gym 5 days/week.  The patient is alone today.  Allergies:  Allergies  Allergen Reactions  . Reglan [Metoclopramide] Hives and Shortness Of Breath    Current Medications: Current Outpatient Medications  Medication Sig Dispense Refill  . albuterol (PROVENTIL HFA;VENTOLIN HFA) 108 (90 Base) MCG/ACT inhaler Inhale 2 puffs into the lungs every 6 (six) hours as needed for wheezing or shortness of breath. 1 Inhaler 2  . albuterol (PROVENTIL) (2.5 MG/3ML) 0.083% nebulizer solution Take 3 mLs (2.5 mg total) by nebulization every 6 (six) hours as needed for wheezing or shortness of breath. 150 mL 1  . chlorhexidine (PERIDEX) 0.12 % solution RM WITH 1/2 CAPFUL FOR 30 SECONDS BID IN THE MORNING AND EVE AFTER BRUSHING TEETH  1  . clotrimazole-betamethasone (LOTRISONE) cream Apply externally BID prn sx up to 2 wks 15 g 0  . EPINEPHrine 0.3 mg/0.3 mL IJ SOAJ injection INJECT INTRAMUSCULARLY AS DIRECTED  0  . fluticasone (FLONASE) 50 MCG/ACT nasal spray Place 1 spray into both nostrils daily. 16 g 2  . ibuprofen (ADVIL,MOTRIN) 600 MG tablet Take 1 tablet (600 mg total) by mouth every 8 (eight) hours as needed. 30 tablet 0  . imiquimod (ALDARA) 5 % cream Apply topically 3 (three) times a week. Apply until total clearance or maximum of 12 weeks 12 each 2  . naproxen (NAPROSYN) 500 MG tablet Take 1 tablet (500 mg total) by mouth 2 (two) times daily with a meal. 30 tablet 0  . Respiratory Therapy Supplies (NEBULIZER/ADULT MASK) KIT Use nebulizer mask and supplies kit for nebulizer machine as needed 1 each 0  . Cholecalciferol (VITAMIN D-1000 MAX ST) 25 MCG (1000 UT) tablet Take by mouth.    Marland Kitchen omeprazole (PRILOSEC) 10 MG capsule Take 1 capsule (10 mg total) by mouth daily. (Patient not taking: Reported on 01/29/2019) 30 capsule 1   No current facility-administered medications for this visit.       Physical Exam: ECOG 0 Blood pressure 129/78, pulse (!) 118, temperature (!) 97.2 F (36.2 C), temperature  source Tympanic, resp. rate 18, weight 263 lb 12.8 oz (119.7 kg).  Physical Exam  Constitutional: She is oriented to person, place, and time. No distress.  HENT:  Head: Normocephalic and atraumatic.  Nose: Nose normal.  Mouth/Throat: Oropharynx is clear and moist. No oropharyngeal exudate.  Eyes: Pupils are equal, round, and reactive to light. EOM are normal. No scleral icterus.  Neck: Normal range of motion. Neck supple.  Cardiovascular: Normal rate and regular rhythm.  No murmur heard. Pulmonary/Chest: Effort normal. No respiratory distress. She has no rales. She exhibits no tenderness.  Abdominal: Soft. She exhibits no distension. There is no abdominal tenderness.  Musculoskeletal: Normal range of motion.  General: No edema.  Neurological: She is alert and oriented to person, place, and time. No cranial nerve deficit. She exhibits normal muscle tone. Coordination normal.  Skin: Skin is warm and dry. She is not diaphoretic. No erythema.  Psychiatric: Affect normal.     Appointment on 01/28/2019  Component Date Value Ref Range Status  . Iron 01/28/2019 55  28 - 170 ug/dL Final  . TIBC 01/28/2019 293  250 - 450 ug/dL Final  . Saturation Ratios 01/28/2019 19  10.4 - 31.8 % Final  . UIBC 01/28/2019 238  ug/dL Final   Performed at [facility], [address] [place], [place] [address]  . Ferritin 01/28/2019 55  11 - 307 ng/mL Final   Performed at [facility], [address] [place], [place] [address]  . WBC 01/28/2019 4.4  4.0 - 10.5 K/uL Final  . RBC 01/28/2019 4.35  3.87 - 5.11 MIL/uL Final  . Hemoglobin 01/28/2019 11.7* 12.0 - 15.0 g/dL Final  . HCT 01/28/2019 36.8  36.0 - 46.0 % Final  . MCV 01/28/2019 84.6  80.0 - 100.0 fL Final  . MCH 01/28/2019 26.9  26.0 - 34.0 pg Final  . MCHC 01/28/2019 31.8  30.0 - 36.0 g/dL Final  . RDW 01/28/2019 13.2  11.5 - 15.5 % Final  . Platelets 01/28/2019 206  150 - 400 K/uL Final  . nRBC 01/28/2019 0.0  0.0 - 0.2  % Final  . Neutrophils Relative % 01/28/2019 50  % Final  . Neutro Abs 01/28/2019 2.2  1.7 - 7.7 K/uL Final  . Lymphocytes Relative 01/28/2019 44  % Final  . Lymphs Abs 01/28/2019 1.9  0.7 - 4.0 K/uL Final  . Monocytes Relative 01/28/2019 5  % Final  . Monocytes Absolute 01/28/2019 0.2  0.1 - 1.0 K/uL Final  . Eosinophils Relative 01/28/2019 1  % Final  . Eosinophils Absolute 01/28/2019 0.1  0.0 - 0.5 K/uL Final  . Basophils Relative 01/28/2019 0  % Final  . Basophils Absolute 01/28/2019 0.0  0.0 - 0.1 K/uL Final  . Immature Granulocytes 01/28/2019 0  % Final  . Abs Immature Granulocytes 01/28/2019 0.01  0.00 - 0.07 K/uL Final   Performed at [facility], [address], [place],  [address]    Assessment:  Bridgid Karne Ozga is a 33 y.o. female with  1. Iron deficiency anemia due to chronic blood loss   labs are reviewed and discussed with patient.  Ferritin level at 55, TSAT 19. TIBC 293.  Hold additional Venofer.    RTC in 6 months for MD assessment, labs (CBVC with diff, ferritin- day before), and +/- Venofer. We spent sufficient time to discuss many aspect of care, questions were answered to patient's satisfaction. Total face to face encounter time for this patient visit was 15 min. >50% of the time was  spent in counseling and coordination of care.   Earlie Server, MD, PhD Hematology Oncology [facility] Pager- [phone] 01/29/2019

## 2019-02-08 ENCOUNTER — Encounter: Payer: Self-pay | Admitting: Family Medicine

## 2019-02-08 ENCOUNTER — Telehealth: Payer: Self-pay | Admitting: Family Medicine

## 2019-02-08 NOTE — Telephone Encounter (Signed)
Called patient, discussed her concerns with her history of mild persistent asthma diagnosis and risk of possible future COVID19 - and her risk of increased complications with asthma.  She request to stay out of work starting tomorrow Saturday 3/21 and return when risk of infection is reduced, to be determined.  I advised her that I could write a letter documenting her asthma. But I cannot guarantee that I can actually take her out of work for this concern at this time. She should work this out with her employer, as I would not be able to medically prove necessity for absence or leave from work.  [name], DO [facility] 02/08/2019, 5:23 PM

## 2019-02-08 NOTE — Telephone Encounter (Signed)
Patient would like to be taken off from work until further notice because she consider herself as high risk patient with Asthma. She also think her workplace is high risk because she works at [employer].

## 2019-02-08 NOTE — Telephone Encounter (Signed)
Pt asked for a note to be out of work because she has asthma and she is at risk for coronavirus.  Her call back number is [phone]

## 2019-02-14 ENCOUNTER — Other Ambulatory Visit: Payer: Self-pay | Admitting: Family Medicine

## 2019-02-14 DIAGNOSIS — J4530 Mild persistent asthma, uncomplicated: Secondary | ICD-10-CM

## 2019-02-14 MED ORDER — ALBUTEROL SULFATE HFA 108 (90 BASE) MCG/ACT IN AERS: 2.0000 | INHALATION_SPRAY | Freq: Four times a day (QID) | RESPIRATORY_TRACT | 2 refills | Status: DC | PRN

## 2019-03-16 ENCOUNTER — Other Ambulatory Visit: Payer: Self-pay

## 2019-03-16 DIAGNOSIS — J4530 Mild persistent asthma, uncomplicated: Secondary | ICD-10-CM

## 2019-03-18 ENCOUNTER — Encounter: Payer: Self-pay | Admitting: Emergency Medicine

## 2019-03-18 ENCOUNTER — Emergency Department: Payer: BLUE CROSS/BLUE SHIELD

## 2019-03-18 ENCOUNTER — Other Ambulatory Visit: Payer: Self-pay

## 2019-03-18 ENCOUNTER — Emergency Department
Admission: EM | Admit: 2019-03-18 | Discharge: 2019-03-18 | Disposition: A | Discharge status: Home / Self Care | Payer: BLUE CROSS/BLUE SHIELD | Attending: Emergency Medicine | Admitting: Emergency Medicine

## 2019-03-18 DIAGNOSIS — Z79899 Other long term (current) drug therapy: Secondary | ICD-10-CM | POA: Diagnosis not present

## 2019-03-18 DIAGNOSIS — J45909 Unspecified asthma, uncomplicated: Secondary | ICD-10-CM | POA: Insufficient documentation

## 2019-03-18 DIAGNOSIS — R42 Dizziness and giddiness: Secondary | ICD-10-CM | POA: Diagnosis not present

## 2019-03-18 LAB — COMPREHENSIVE METABOLIC PANEL
ALT: 11 U/L (ref 0–44)
AST: 17 U/L (ref 15–41)
Albumin: 4.6 g/dL (ref 3.5–5.0)
Alkaline Phosphatase: 55 U/L (ref 38–126)
Anion gap: 11 (ref 5–15)
BUN: 16 mg/dL (ref 6–20)
CO2: 24 mmol/L (ref 22–32)
Calcium: 9.3 mg/dL (ref 8.9–10.3)
Chloride: 103 mmol/L (ref 98–111)
Creatinine, Ser: 0.63 mg/dL (ref 0.44–1.00)
GFR calc Af Amer: >60 mL/min (ref >60)
GFR calc non Af Amer: >60 mL/min (ref >60)
Glucose, Bld: 95 mg/dL (ref 70–99)
Potassium: 3.7 mmol/L (ref 3.5–5.1)
Sodium: 138 mmol/L (ref 135–145)
Total Bilirubin: 0.4 mg/dL (ref 0.3–1.2)
Total Protein: 7.7 g/dL (ref 6.5–8.1)

## 2019-03-18 LAB — URINALYSIS, COMPLETE (UACMP) WITH MICROSCOPIC
Bacteria, UA: NONE SEEN
Bilirubin Urine: NEGATIVE
Glucose, UA: NEGATIVE mg/dL
Ketones, ur: NEGATIVE mg/dL
Leukocytes,Ua: NEGATIVE
Nitrite: NEGATIVE
Protein, ur: NEGATIVE mg/dL
Specific Gravity, Urine: 1.008 (ref 1.005–1.030)
WBC, UA: NONE SEEN WBC/hpf (ref 0–5)
pH: 5 (ref 5.0–8.0)

## 2019-03-18 LAB — CBC WITH DIFFERENTIAL/PLATELET
Abs Immature Granulocytes: 0.01 10*3/uL (ref 0.00–0.07)
Basophils Absolute: 0 10*3/uL (ref 0.0–0.1)
Basophils Relative: 0 %
Eosinophils Absolute: 0.1 10*3/uL (ref 0.0–0.5)
Eosinophils Relative: 2 %
HCT: 38 % (ref 36.0–46.0)
Hemoglobin: 12.2 g/dL (ref 12.0–15.0)
Immature Granulocytes: 0 %
Lymphocytes Relative: 41 %
Lymphs Abs: 2.1 10*3/uL (ref 0.7–4.0)
MCH: 27.2 pg (ref 26.0–34.0)
MCHC: 32.1 g/dL (ref 30.0–36.0)
MCV: 84.6 fL (ref 80.0–100.0)
Monocytes Absolute: 0.3 10*3/uL (ref 0.1–1.0)
Monocytes Relative: 6 %
Neutro Abs: 2.6 10*3/uL (ref 1.7–7.7)
Neutrophils Relative %: 51 %
Platelets: 242 10*3/uL (ref 150–400)
RBC: 4.49 MIL/uL (ref 3.87–5.11)
RDW: 12.7 % (ref 11.5–15.5)
WBC: 5.1 10*3/uL (ref 4.0–10.5)
nRBC: 0 % (ref 0.0–0.2)

## 2019-03-18 MED ORDER — NEBULIZER/ADULT MASK KIT: PACK | 0 refills | Status: DC

## 2019-03-18 NOTE — ED Provider Notes (Signed)
[facility] Emergency Department Provider Note  ____________________________________________  Time seen: Approximately 3:13 PM  I have reviewed the triage vital signs and the nursing notes.   HISTORY  Chief Complaint Dizziness    HPI Jasmine Gordon is a 33 y.o. female presents to the emergency department with dizziness and feeling "like a limp noodle".  Patient states that she was at [facility] [employer] [employer] earlier in the day and people were using chemicals to disinfect carts.  Patient reports that she had to lean against a cart to steady herself.  Patient reports that she had some shortness of breath last week secondary to her asthma that resolved with albuterol.  She denies chest pain or chest tightness.  She denies nausea, vomiting or abdominal pain.  She denies possibility of pregnancy.  Patient reports that she has been staying hydrated and has eaten today.  She denies feeling similar symptoms in the past.  Patient states "I do not want to go back to work today.        Past Medical History:  Diagnosis Date  . Allergy   . Anemia   . Asthma   . Migraine   . Panic attack   . PTSD (post-traumatic stress disorder) 2016    Patient Active Problem List   Diagnosis Date Noted  . Genital warts 09/11/2018  . Positive TB test 09/25/2017  . Allergic contact dermatitis due to metals 05/17/2017  . Pre-diabetes 03/06/2017  . Hyperlipidemia 03/06/2017  . Allergic reaction 02/08/2017  . Urticaria due to food allergy 02/08/2017  . Vitamin D deficiency 11/08/2016  . Iron deficiency anemia due to chronic blood loss 10/03/2016  . Menorrhagia with regular cycle 06/27/2016  . Mild persistent asthma 06/17/2016  . Anxiety and depression 06/17/2016  . Morbid obesity with BMI of 40.0-44.9, adult ([place]) 06/17/2016    Past Surgical History:  Procedure Laterality Date  . APPENDECTOMY    . Plantar warts      Prior to Admission medications   Medication Sig Start Date End Date  Taking? Authorizing Provider  albuterol (PROVENTIL HFA;VENTOLIN HFA) 108 (90 Base) MCG/ACT inhaler Inhale 2 puffs into the lungs every 6 (six) hours as needed for wheezing or shortness of breath. 02/14/19   Karamalegos, Devonne Doughty, DO  albuterol (PROVENTIL) (2.5 MG/3ML) 0.083% nebulizer solution Take 3 mLs (2.5 mg total) by nebulization every 6 (six) hours as needed for wheezing or shortness of breath. 11/06/18   Karamalegos, Devonne Doughty, DO  Cholecalciferol (VITAMIN D-1000 MAX ST) 25 MCG (1000 UT) tablet Take by mouth.    [provider]  clotrimazole-betamethasone (LOTRISONE) cream Apply externally BID prn sx up to 2 wks 41/74/08   Copland, Alicia B, PA-C  EPINEPHrine 0.3 mg/0.3 mL IJ SOAJ injection INJECT INTRAMUSCULARLY AS DIRECTED 01/29/17   [provider]  fluticasone (FLONASE) 50 MCG/ACT nasal spray Place 1 spray into both nostrils daily. 08/20/18 08/20/19  Lannie Fields, PA-C  ibuprofen (ADVIL,MOTRIN) 600 MG tablet Take 1 tablet (600 mg total) by mouth every 8 (eight) hours as needed. 12/10/18   Darel Hong, MD  imiquimod [place]) 5 % cream Apply topically 3 (three) times a week. Apply until total clearance or maximum of 12 weeks 11/24/46   Copland, Alicia B, PA-C  naproxen (NAPROSYN) 500 MG tablet Take 1 tablet (500 mg total) by mouth 2 (two) times daily with a meal. 12/18/18   Triplett, Cari B, FNP  Respiratory Therapy Supplies (NEBULIZER/ADULT MASK) KIT Use nebulizer mask and supplies kit for nebulizer machine  as needed 03/18/19   [name], DO    Allergies Reglan [metoclopramide]  Family History  Problem Relation Age of Onset  . Depression Mother   . Mental retardation Mother   . Bipolar disorder Mother   . Diabetes Father   . Stroke Maternal Grandfather   . Diabetes Maternal Grandfather     Social History Social History   Tobacco Use  . Smoking status: Never Smoker  . Smokeless tobacco: Never Used  Substance Use Topics  . Alcohol use: No   . Drug use: No     Review of Systems  Constitutional: Patient has dizziness.  Eyes: No visual changes. No discharge ENT: No upper respiratory complaints. Cardiovascular: no chest pain. Respiratory: no cough. No SOB. Gastrointestinal: No abdominal pain.  No nausea, no vomiting.  No diarrhea.  No constipation. Musculoskeletal: Negative for musculoskeletal pain. Skin: Negative for rash, abrasions, lacerations, ecchymosis. Neurological: Negative for headaches, focal weakness or numbness.   ____________________________________________   PHYSICAL EXAM:  VITAL SIGNS: ED Triage Vitals  Enc Vitals Group     BP 03/18/19 1421 (!) 143/86     Pulse Rate 03/18/19 1421 98     Resp 03/18/19 1421 20     Temp 03/18/19 1421 99 F (37.2 C)     Temp Source 03/18/19 1421 Oral     SpO2 03/18/19 1421 100 %     Weight 03/18/19 1417 260 lb (117.9 kg)     Height 03/18/19 1417 '5\' 6"'  (1.676 m)     Head Circumference --      Peak Flow --      Pain Score 03/18/19 1417 0     Pain Loc --      Pain Edu? --      Excl. in [place]? --      Constitutional: Alert and oriented. Well appearing and in no acute distress. Eyes: Conjunctivae are normal. PERRL. EOMI. Head: Atraumatic. ENT:      Ears: TMs are pearly.       Nose: No congestion/rhinnorhea.      Mouth/Throat: Mucous membranes are moist.  Neck: No stridor.  No cervical spine tenderness to palpation. Cardiovascular: Normal rate, regular rhythm. Normal S1 and S2.  Good peripheral circulation. Respiratory: Normal respiratory effort without tachypnea or retractions. Lungs CTAB. Good air entry to the bases with no decreased or absent breath sounds. Gastrointestinal: Bowel sounds 4 quadrants. Soft and nontender to palpation. No guarding or rigidity. No palpable masses. No distention. No CVA tenderness. Musculoskeletal: Full range of motion to all extremities. No gross deformities appreciated. Neurologic:  Normal speech and language. No gross focal  neurologic deficits are appreciated.  Skin:  Skin is warm, dry and intact. No rash noted. Psychiatric: Mood and affect are normal. Speech and behavior are normal. Patient exhibits appropriate insight and judgement.   ____________________________________________   LABS (all labs ordered are listed, but only abnormal results are displayed)  Labs Reviewed  URINALYSIS, COMPLETE (UACMP) WITH MICROSCOPIC - Abnormal; Notable for the following components:      Result Value   Color, Urine STRAW (*)    APPearance CLEAR (*)    Hgb urine dipstick SMALL (*)    All other components within normal limits  CBC WITH DIFFERENTIAL/PLATELET  COMPREHENSIVE METABOLIC PANEL   ____________________________________________  EKG  Normal sinus rhythm without ST segment elevation. ____________________________________________  RADIOLOGY I personally viewed and evaluated these images as part of my medical decision making, as well as reviewing the written report by the  radiologist.    Dg Chest 1 View  Result Date: 03/18/2019 CLINICAL DATA:  33 year old female with a history of dizziness EXAM: CHEST  1 VIEW COMPARISON:  12/09/2018, CT 12/10/2018 FINDINGS: Cardiomediastinal silhouette unchanged in size and contour. No pneumothorax or pleural effusion. No confluent airspace disease. No acute displaced fracture IMPRESSION: Negative for acute cardiopulmonary disease Electronically Signed   By: Corrie Mckusick D.O.   On: 03/18/2019 15:53    ____________________________________________    PROCEDURES  Procedure(s) performed:    Procedures    Medications - No data to display   ____________________________________________   INITIAL IMPRESSION / ASSESSMENT AND PLAN / ED COURSE  Pertinent labs & imaging results that were available during my care of the patient were reviewed by me and considered in my medical decision making (see chart for details).  Review of the [place] CSRS was performed in accordance of  the [place] prior to dispensing any controlled drugs.           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  On physical exam, patient seemed to be resting comfortably with no apparent respiratory distress.    Differential diagnosis included electrolyte abnormality, arrhythmia and COVID-19.  CBC and CMP were reassuring.  Patient denied possibility of pregnancy.  EKG revealed normal sinus rhythm without apparent arrhythmia.  Chest x-ray revealed no consolidations, opacities or infiltrates that would suggest pneumonia or other infection.  Absence of shortness of breath, chest tightness, chest pain and fever decreases suspicion for COVID-19.  Patient was given work note as requested.  I advised hydration at home.  Advised patient to quarantine herself if she develops shortness of breath, cough and fever for the next 7 days in early 72 hours after fever resolves.  She voiced understanding.     ____________________________________________  FINAL CLINICAL IMPRESSION(S) / ED DIAGNOSES  Final diagnoses:  Dizziness      NEW MEDICATIONS STARTED DURING THIS VISIT:  ED Discharge Orders    None          This chart was dictated using voice recognition software/Dragon. Despite best efforts to proofread, errors can occur which can change the meaning. Any change was purely unintentional.    Lannie Fields, PA-C 03/18/19 1609    Nena Polio, MD 03/18/19 2356

## 2019-03-18 NOTE — ED Notes (Signed)
See triage note   Presents with some dizziness and scratchiness in throat  States was developed sx's have being exposed to new cleaning spray at work   States she felt like her sinuses were tight at first then some scratchiness to throat  Denies any SOB or wheezing    Low grade temp on arrival

## 2019-03-18 NOTE — ED Triage Notes (Signed)
Pt reports went to work this and felt fine and then she started to feel "wobbly". Pt reports her employer started using a new spray to sanitize carts and work and she is unsure if that is what has caused it.

## 2019-03-30 ENCOUNTER — Emergency Department: Payer: BLUE CROSS/BLUE SHIELD

## 2019-03-30 ENCOUNTER — Encounter: Payer: Self-pay | Admitting: Emergency Medicine

## 2019-03-30 ENCOUNTER — Other Ambulatory Visit: Payer: Self-pay

## 2019-03-30 ENCOUNTER — Emergency Department
Admission: EM | Admit: 2019-03-30 | Discharge: 2019-03-30 | Disposition: A | Discharge status: Home / Self Care | Payer: BLUE CROSS/BLUE SHIELD | Attending: Emergency Medicine | Admitting: Emergency Medicine

## 2019-03-30 DIAGNOSIS — F329 Major depressive disorder, single episode, unspecified: Secondary | ICD-10-CM | POA: Insufficient documentation

## 2019-03-30 DIAGNOSIS — R059 Cough, unspecified: Secondary | ICD-10-CM

## 2019-03-30 DIAGNOSIS — R0602 Shortness of breath: Secondary | ICD-10-CM | POA: Diagnosis not present

## 2019-03-30 DIAGNOSIS — R05 Cough: Secondary | ICD-10-CM | POA: Diagnosis not present

## 2019-03-30 DIAGNOSIS — F419 Anxiety disorder, unspecified: Secondary | ICD-10-CM | POA: Insufficient documentation

## 2019-03-30 DIAGNOSIS — Z79899 Other long term (current) drug therapy: Secondary | ICD-10-CM | POA: Insufficient documentation

## 2019-03-30 DIAGNOSIS — J45909 Unspecified asthma, uncomplicated: Secondary | ICD-10-CM | POA: Diagnosis not present

## 2019-03-30 DIAGNOSIS — Z20828 Contact with and (suspected) exposure to other viral communicable diseases: Secondary | ICD-10-CM | POA: Diagnosis not present

## 2019-03-30 LAB — CBC WITH DIFFERENTIAL/PLATELET
Abs Immature Granulocytes: 0.02 10*3/uL (ref 0.00–0.07)
Basophils Absolute: 0 10*3/uL (ref 0.0–0.1)
Basophils Relative: 0 %
Eosinophils Absolute: 0.1 10*3/uL (ref 0.0–0.5)
Eosinophils Relative: 2 %
HCT: 37.8 % (ref 36.0–46.0)
Hemoglobin: 12 g/dL (ref 12.0–15.0)
Immature Granulocytes: 0 %
Lymphocytes Relative: 41 %
Lymphs Abs: 2.3 10*3/uL (ref 0.7–4.0)
MCH: 27.3 pg (ref 26.0–34.0)
MCHC: 31.7 g/dL (ref 30.0–36.0)
MCV: 86.1 fL (ref 80.0–100.0)
Monocytes Absolute: 0.3 10*3/uL (ref 0.1–1.0)
Monocytes Relative: 6 %
Neutro Abs: 2.8 10*3/uL (ref 1.7–7.7)
Neutrophils Relative %: 51 %
Platelets: 237 10*3/uL (ref 150–400)
RBC: 4.39 MIL/uL (ref 3.87–5.11)
RDW: 12.8 % (ref 11.5–15.5)
WBC: 5.6 10*3/uL (ref 4.0–10.5)
nRBC: 0 % (ref 0.0–0.2)

## 2019-03-30 LAB — BASIC METABOLIC PANEL
Anion gap: 7 (ref 5–15)
BUN: 14 mg/dL (ref 6–20)
CO2: 24 mmol/L (ref 22–32)
Calcium: 9.4 mg/dL (ref 8.9–10.3)
Chloride: 107 mmol/L (ref 98–111)
Creatinine, Ser: 0.74 mg/dL (ref 0.44–1.00)
GFR calc Af Amer: >60 mL/min (ref >60)
GFR calc non Af Amer: >60 mL/min (ref >60)
Glucose, Bld: 107 mg/dL — ABNORMAL HIGH (ref 70–99)
Potassium: 3.5 mmol/L (ref 3.5–5.1)
Sodium: 138 mmol/L (ref 135–145)

## 2019-03-30 LAB — POCT PREGNANCY, URINE: Preg Test, Ur: NEGATIVE

## 2019-03-30 LAB — TROPONIN I: Troponin I: <0.03 ng/mL (ref <0.03)

## 2019-03-30 MED ORDER — ONDANSETRON 4 MG PO TBDP: 4.0000 mg | ORAL_TABLET | Freq: Three times a day (TID) | ORAL | 0 refills | Status: DC | PRN

## 2019-03-30 MED ORDER — CYCLOBENZAPRINE HCL 5 MG PO TABS: 5.0000 mg | ORAL_TABLET | Freq: Three times a day (TID) | ORAL | 0 refills | Status: DC | PRN

## 2019-03-30 MED ORDER — BENZONATATE 100 MG PO CAPS: ORAL_CAPSULE | ORAL | 0 refills | Status: DC

## 2019-03-30 MED ORDER — ONDANSETRON 4 MG PO TBDP
4.0000 mg | ORAL_TABLET | Freq: Once | ORAL | Status: AC
  Administered 2019-03-30: 4 mg via ORAL
  Filled 2019-03-30: qty 1

## 2019-03-30 NOTE — ED Notes (Signed)
Peripheral IV discontinued. Catheter intact. No signs of infiltration or redness. Gauze applied to IV site.   Discharge instructions reviewed with patient. Questions fielded by this RN. Patient verbalizes understanding of instructions. Patient discharged home in stable condition per Jenise, PA. No acute distress noted at time of discharge.    

## 2019-03-30 NOTE — ED Provider Notes (Signed)
[facility] Emergency Department Provider Note ____________________________________________  Time seen: 1812  I have reviewed the triage vital signs and the nursing notes.  HISTORY  Chief Complaint  Shortness of Breath and Cough  HPI Jasmine Gordon is a 33 y.o. female presents herself to the ED for evaluation of shortness of breath, cough, and some chest soreness.  Patient describes symptoms have been present for the last 3 days.  She denies any fevers, chills, sweats, nausea, vomiting, dizziness.  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 She denies any fevers, nausea, vomiting, or diarrhea. She also denies any recent travel, sick contacts, or other high-risk exposure.   Past Medical History:  Diagnosis Date  . Allergy   . Anemia   . Asthma   . Migraine   . Panic attack   . PTSD (post-traumatic stress disorder) 2016    Patient Active Problem List   Diagnosis Date Noted  . Genital warts 09/11/2018  . Positive TB test 09/25/2017  . Allergic contact dermatitis due to metals 05/17/2017  . Pre-diabetes 03/06/2017  . Hyperlipidemia 03/06/2017  . Allergic reaction 02/08/2017  . Urticaria due to food allergy 02/08/2017  . Vitamin D deficiency 11/08/2016  . Iron deficiency anemia due to chronic blood loss 10/03/2016  . Menorrhagia with regular cycle 06/27/2016  . Mild persistent asthma 06/17/2016  . Anxiety and depression 06/17/2016  . Morbid obesity with BMI of 40.0-44.9, adult ([place]) 06/17/2016    Past Surgical History:  Procedure Laterality Date  . APPENDECTOMY    . Plantar warts      Prior to Admission medications   Medication Sig Start Date End Date Taking? Authorizing Provider  albuterol (PROVENTIL HFA;VENTOLIN HFA) 108 (90 Base) MCG/ACT inhaler Inhale 2 puffs into the lungs every 6 (six) hours as needed for wheezing or shortness of breath. 02/14/19   Karamalegos, Devonne Doughty, DO  albuterol  (PROVENTIL) (2.5 MG/3ML) 0.083% nebulizer solution Take 3 mLs (2.5 mg total) by nebulization every 6 (six) hours as needed for wheezing or shortness of breath. 11/06/18   Parks Ranger, Devonne Doughty, DO  benzonatate (TESSALON PERLES) 100 MG capsule Take 1-2 tabs TID prn cough 03/30/19   Kaiyah Eber, Dannielle Karvonen, PA-C  Cholecalciferol (VITAMIN D-1000 MAX ST) 25 MCG (1000 UT) tablet Take by mouth.    [provider]  clotrimazole-betamethasone (LOTRISONE) cream Apply externally BID prn sx up to 2 wks 75/64/33   Copland, Elmo Putt B, PA-C  cyclobenzaprine (FLEXERIL) 5 MG tablet Take 1 tablet (5 mg total) by mouth 3 (three) times daily as needed. 03/30/19   Culver Feighner, Dannielle Karvonen, PA-C  EPINEPHrine 0.3 mg/0.3 mL IJ SOAJ injection INJECT INTRAMUSCULARLY AS DIRECTED 01/29/17   [provider]  fluticasone (FLONASE) 50 MCG/ACT nasal spray Place 1 spray into both nostrils daily. 08/20/18 08/20/19  Lannie Fields, PA-C  ibuprofen (ADVIL,MOTRIN) 600 MG tablet Take 1 tablet (600 mg total) by mouth every 8 (eight) hours as needed. 12/10/18   Darel Hong, MD  imiquimod Leroy Sea) 5 % cream Apply topically 3 (three) times a week. Apply until total clearance or maximum of 12 weeks 12/30/49   Copland, Alicia B, PA-C  naproxen (NAPROSYN) 500 MG tablet Take 1 tablet (500 mg total) by mouth 2 (two) times daily with a meal. 12/18/18   Triplett, Cari B, FNP  ondansetron (ZOFRAN ODT) 4 MG disintegrating tablet Take 1 tablet (4 mg total) by mouth every 8 (eight) hours as needed.  03/30/19   Latania Bascomb, Dannielle Karvonen, PA-C  Respiratory Therapy Supplies (NEBULIZER/ADULT MASK) KIT Use nebulizer mask and supplies kit for nebulizer machine as needed 03/18/19   [name], DO    Allergies Reglan [metoclopramide]  Family History  Problem Relation Age of Onset  . Depression Mother   . Mental retardation Mother   . Bipolar disorder Mother   . Diabetes Father   . Stroke Maternal Grandfather   . Diabetes Maternal  Grandfather     Social History Social History   Tobacco Use  . Smoking status: Never Smoker  . Smokeless tobacco: Never Used  Substance Use Topics  . Alcohol use: No  . Drug use: No    Review of Systems  Constitutional: Negative for fever. Eyes: Negative for visual changes. ENT: Negative for sore throat. Cardiovascular: Negative for chest pain. Respiratory: Positive for shortness of breath. Gastrointestinal: Negative for abdominal pain, vomiting and diarrhea. Genitourinary: Negative for dysuria. Musculoskeletal: Negative for back pain. Skin: Negative for rash. Neurological: Negative for headaches, focal weakness or numbness. ____________________________________________  PHYSICAL EXAM:  VITAL SIGNS: ED Triage Vitals  Enc Vitals Group     BP 03/30/19 1736 (!) 143/85     Pulse Rate 03/30/19 1736 (!) 118     Resp 03/30/19 1736 20     Temp 03/30/19 1736 99.1 F (37.3 C)     Temp Source 03/30/19 1736 Oral     SpO2 03/30/19 1736 100 %     Weight 03/30/19 1740 260 lb (117.9 kg)     Height 03/30/19 1740 '5\' 6"'$  (1.676 m)     Head Circumference --      Peak Flow --      Pain Score 03/30/19 1740 6     Pain Loc --      Pain Edu? --      Excl. in [place]? --     Constitutional: Alert and oriented. Well appearing and in no distress. Head: Normocephalic and atraumatic. Eyes: Conjunctivae are normal. Normal extraocular movements Ears: Canals clear. TMs intact bilaterally. Nose: No congestion/rhinorrhea/epistaxis. Mouth/Throat: Mucous membranes are moist. Cardiovascular: Normal rate, regular rhythm. Normal distal pulses. Respiratory: Normal respiratory effort. No wheezes/rales/rhonchi. Gastrointestinal: Soft and nontender. No distention. Musculoskeletal: Nontender with normal range of motion in all extremities.  Neurologic:  Normal gait without ataxia. Normal speech and language. No gross focal neurologic deficits are appreciated. Skin:  Skin is warm, dry and intact. No rash  noted. Psychiatric: Mood and affect are normal. Patient exhibits appropriate insight and judgment. ____________________________________________   LABS (pertinent positives/negatives) Labs Reviewed  BASIC METABOLIC PANEL - Abnormal; Notable for the following components:      Result Value   Glucose, Bld 107 (*)    All other components within normal limits  NOVEL CORONAVIRUS, NAA (HOSPITAL ORDER, SEND-OUT TO REF LAB)  CBC WITH DIFFERENTIAL/PLATELET  TROPONIN I  POC URINE PREG, ED  POCT PREGNANCY, URINE  ____________________________________________   RADIOLOGY  CXR  negative ____________________________________________  PROCEDURES  Procedures Zofran 4 mg ODT ____________________________________________  INITIAL IMPRESSION / ASSESSMENT AND PLAN / ED COURSE  Jasmine Gordon was evaluated in Emergency Department on 03/30/2019 for the symptoms described in the history of present illness. She was evaluated in the context of the global COVID-19 pandemic, which necessitated consideration that the patient might be at risk for infection with the SARS-CoV-2 virus that causes COVID-19. Institutional protocols and algorithms that pertain to the evaluation of patients at risk for COVID-19 are in a state of  rapid change based on information released by regulatory bodies including the CDC and federal and state organizations. These policies and algorithms were followed during the patient's care in the ED.  Patient with ED evaluation of a 3-day complaint of intermittent cough and some mild shortness of breath.  Patient also complained of some intermittent substernal chest pressure.  She is without fever, chills, sweats, nausea, vomiting, or syncope.  Her exam is overall benign and reassuring at this time.  Labs and chest x-ray also within normal limits.  Patient voices some concerns for coronavirus exposure secondary to her work activities.  As such, we will do a send off test for her benefit.  She  is advised to quarantine at home until results are available.  A work note is provided holding her out of work for the next 3 days.  She will then be advised on 14 instructions based on test results.  Patient is also discharged with prescriptions for Tessalon Perles, Zofran, and Flexeril.  It is felt that her chest discomfort is likely musculoskeletal in nature.  She is on medications for her asthma, and will follow-up with primary provider as needed.  Return precautions have been reviewed. ____________________________________________  FINAL CLINICAL IMPRESSION(S) / ED DIAGNOSES  Final diagnoses:  Cough  Shortness of breath      Carmie End, Dannielle Karvonen, PA-C 03/30/19 1937    Nance Pear, MD 03/30/19 2218

## 2019-03-30 NOTE — Discharge Instructions (Addendum)
Your exam, labs, and CXR are normal today. You have a test for COVID-19 pending. You should continue to monitor and treat your symptoms with home meds, OTC meds, and prescription meds. Continue to limit contacts and quarantine yourself until your results are available. Return to the ED as needed.

## 2019-03-30 NOTE — ED Triage Notes (Signed)
SOB, cough and chest soreness with cough x 3 days.

## 2019-04-01 LAB — NOVEL CORONAVIRUS, NAA (HOSP ORDER, SEND-OUT TO REF LAB; TAT 18-24 HRS): SARS-CoV-2, NAA: NOT DETECTED

## 2019-05-06 ENCOUNTER — Emergency Department
Admission: EM | Admit: 2019-05-06 | Discharge: 2019-05-06 | Disposition: A | Discharge status: Home / Self Care | Payer: BC Managed Care – PPO | Attending: Emergency Medicine | Admitting: Emergency Medicine

## 2019-05-06 ENCOUNTER — Other Ambulatory Visit: Payer: Self-pay

## 2019-05-06 ENCOUNTER — Emergency Department: Payer: BC Managed Care – PPO

## 2019-05-06 ENCOUNTER — Encounter: Payer: Self-pay | Admitting: Emergency Medicine

## 2019-05-06 DIAGNOSIS — R0789 Other chest pain: Secondary | ICD-10-CM | POA: Diagnosis not present

## 2019-05-06 DIAGNOSIS — R0600 Dyspnea, unspecified: Secondary | ICD-10-CM | POA: Diagnosis not present

## 2019-05-06 DIAGNOSIS — J45909 Unspecified asthma, uncomplicated: Secondary | ICD-10-CM | POA: Diagnosis not present

## 2019-05-06 DIAGNOSIS — R05 Cough: Secondary | ICD-10-CM | POA: Insufficient documentation

## 2019-05-06 DIAGNOSIS — R7303 Prediabetes: Secondary | ICD-10-CM | POA: Insufficient documentation

## 2019-05-06 DIAGNOSIS — Z20828 Contact with and (suspected) exposure to other viral communicable diseases: Secondary | ICD-10-CM | POA: Diagnosis not present

## 2019-05-06 DIAGNOSIS — Z79899 Other long term (current) drug therapy: Secondary | ICD-10-CM | POA: Insufficient documentation

## 2019-05-06 DIAGNOSIS — R0602 Shortness of breath: Secondary | ICD-10-CM

## 2019-05-06 LAB — BASIC METABOLIC PANEL
Anion gap: 10 (ref 5–15)
BUN: 14 mg/dL (ref 6–20)
CO2: 23 mmol/L (ref 22–32)
Calcium: 9 mg/dL (ref 8.9–10.3)
Chloride: 105 mmol/L (ref 98–111)
Creatinine, Ser: 0.69 mg/dL (ref 0.44–1.00)
GFR calc Af Amer: >60 mL/min (ref >60)
GFR calc non Af Amer: >60 mL/min (ref >60)
Glucose, Bld: 105 mg/dL — ABNORMAL HIGH (ref 70–99)
Potassium: 3.7 mmol/L (ref 3.5–5.1)
Sodium: 138 mmol/L (ref 135–145)

## 2019-05-06 LAB — CBC
HCT: 38.7 % (ref 36.0–46.0)
Hemoglobin: 11.9 g/dL — ABNORMAL LOW (ref 12.0–15.0)
MCH: 26.5 pg (ref 26.0–34.0)
MCHC: 30.7 g/dL (ref 30.0–36.0)
MCV: 86.2 fL (ref 80.0–100.0)
Platelets: 236 10*3/uL (ref 150–400)
RBC: 4.49 MIL/uL (ref 3.87–5.11)
RDW: 12.7 % (ref 11.5–15.5)
WBC: 6.1 10*3/uL (ref 4.0–10.5)
nRBC: 0 % (ref 0.0–0.2)

## 2019-05-06 LAB — POCT PREGNANCY, URINE: Preg Test, Ur: NEGATIVE

## 2019-05-06 LAB — TROPONIN I: Troponin I: <0.03 ng/mL (ref <0.03)

## 2019-05-06 NOTE — ED Triage Notes (Signed)
Pt presents to ED with chest pain and exertional sob while at work all day. Feeling fatigued with nausea. Vomiting after coughing X1. Occasional cough. Had to use her inhaler today one time while working. Pt currently appears anxious with no increased work of breathing or acute distress noted at this time.

## 2019-05-06 NOTE — ED Provider Notes (Signed)
[facility] Emergency Department Provider Note  Time seen: 10:43 PM  I have reviewed the triage vital signs and the nursing notes.   HISTORY  Chief Complaint Chest Pain and Shortness of Breath   HPI Jasmine Gordon is a 33 y.o. female with a past medical history of anemia, asthma, PTSD, presents to the emergency department for shortness of breath.  According to the patient she works at [employer], states they have been using a very strong sanitizer on her shopping carts, she thinks she could have inhaled it leading to some mild shortness of breath over the past 1 to 2 days.  Patient denies any chest pain but did state mild tightness/pressure feeling earlier.  Denies any abdominal pain, nausea vomiting or diarrhea.  No fever.  Does state occasional dry cough.   Past Medical History:  Diagnosis Date  . Allergy   . Anemia   . Asthma   . Migraine   . Panic attack   . PTSD (post-traumatic stress disorder) 2016    Patient Active Problem List   Diagnosis Date Noted  . Genital warts 09/11/2018  . Positive TB test 09/25/2017  . Allergic contact dermatitis due to metals 05/17/2017  . Pre-diabetes 03/06/2017  . Hyperlipidemia 03/06/2017  . Allergic reaction 02/08/2017  . Urticaria due to food allergy 02/08/2017  . Vitamin D deficiency 11/08/2016  . Iron deficiency anemia due to chronic blood loss 10/03/2016  . Menorrhagia with regular cycle 06/27/2016  . Mild persistent asthma 06/17/2016  . Anxiety and depression 06/17/2016  . Morbid obesity with BMI of 40.0-44.9, adult ([place]) 06/17/2016    Past Surgical History:  Procedure Laterality Date  . APPENDECTOMY    . Plantar warts      Prior to Admission medications   Medication Sig Start Date End Date Taking? Authorizing Provider  albuterol (PROVENTIL HFA;VENTOLIN HFA) 108 (90 Base) MCG/ACT inhaler Inhale 2 puffs into the lungs every 6 (six) hours as needed for wheezing or shortness of breath. 02/14/19    Karamalegos, Devonne Doughty, DO  albuterol (PROVENTIL) (2.5 MG/3ML) 0.083% nebulizer solution Take 3 mLs (2.5 mg total) by nebulization every 6 (six) hours as needed for wheezing or shortness of breath. 11/06/18   Parks Ranger, Devonne Doughty, DO  benzonatate (TESSALON PERLES) 100 MG capsule Take 1-2 tabs TID prn cough 03/30/19   Menshew, Dannielle Karvonen, PA-C  Cholecalciferol (VITAMIN D-1000 MAX ST) 25 MCG (1000 UT) tablet Take by mouth.    [provider]  clotrimazole-betamethasone (LOTRISONE) cream Apply externally BID prn sx up to 2 wks 27/25/36   Copland, Elmo Putt B, PA-C  cyclobenzaprine (FLEXERIL) 5 MG tablet Take 1 tablet (5 mg total) by mouth 3 (three) times daily as needed. 03/30/19   Menshew, Dannielle Karvonen, PA-C  EPINEPHrine 0.3 mg/0.3 mL IJ SOAJ injection INJECT INTRAMUSCULARLY AS DIRECTED 01/29/17   [provider]  fluticasone (FLONASE) 50 MCG/ACT nasal spray Place 1 spray into both nostrils daily. 08/20/18 08/20/19  Lannie Fields, PA-C  ibuprofen (ADVIL,MOTRIN) 600 MG tablet Take 1 tablet (600 mg total) by mouth every 8 (eight) hours as needed. 12/10/18   Darel Hong, MD  imiquimod Leroy Sea) 5 % cream Apply topically 3 (three) times a week. Apply until total clearance or maximum of 12 weeks 04/25/39   Copland, Alicia B, PA-C  naproxen (NAPROSYN) 500 MG tablet Take 1 tablet (500 mg total) by mouth 2 (two) times daily with a meal. 12/18/18   Triplett, Cari B, FNP  ondansetron [facility]  ODT) 4 MG disintegrating tablet Take 1 tablet (4 mg total) by mouth every 8 (eight) hours as needed. 03/30/19   Menshew, Dannielle Karvonen, PA-C  Respiratory Therapy Supplies (NEBULIZER/ADULT MASK) KIT Use nebulizer mask and supplies kit for nebulizer machine as needed 03/18/19   [name], DO    Allergies  Allergen Reactions  . Reglan [Metoclopramide] Hives and Shortness Of Breath    Family History  Problem Relation Age of Onset  . Depression Mother   . Mental retardation Mother   .  Bipolar disorder Mother   . Diabetes Father   . Stroke Maternal Grandfather   . Diabetes Maternal Grandfather     Social History Social History   Tobacco Use  . Smoking status: Never Smoker  . Smokeless tobacco: Never Used  Substance Use Topics  . Alcohol use: No  . Drug use: No    Review of Systems Constitutional: Negative for fever Cardiovascular: Mild chest tightness earlier Respiratory: Intermittent shortness of breath, somewhat worse with exertion over the past 1 to 2 days. Gastrointestinal: Negative for abdominal pain, vomiting Musculoskeletal: Negative for musculoskeletal complaints Skin: Negative for skin complaints  Neurological: Negative for headache All other ROS negative  ____________________________________________   PHYSICAL EXAM:  VITAL SIGNS: ED Triage Vitals  Enc Vitals Group     BP 05/06/19 1945 (!) 141/99     Pulse Rate 05/06/19 1945 (!) 104     Resp 05/06/19 1945 20     Temp 05/06/19 1945 98.8 F (37.1 C)     Temp Source 05/06/19 1945 Oral     SpO2 05/06/19 1945 100 %     Weight 05/06/19 1950 260 lb (117.9 kg)     Height 05/06/19 1950 '5\' 6"'  (1.676 m)     Head Circumference --      Peak Flow --      Pain Score 05/06/19 1946 4     Pain Loc --      Pain Edu? --      Excl. in [place]? --     Constitutional: Alert and oriented. Well appearing and in no distress. Eyes: Normal exam ENT      Head: Normocephalic and atraumatic.      Mouth/Throat: Mucous membranes are moist. Cardiovascular: Normal rate, regular rhythm. No murmurs, rubs, or gallops. Respiratory: Normal respiratory effort without tachypnea nor retractions. Breath sounds are clear  Gastrointestinal: Soft and nontender. No distention.   Musculoskeletal: Nontender with normal range of motion in all extremities. No lower extremity tenderness or edema. Neurologic:  Normal speech and language. No gross focal neurologic deficits  Skin:  Skin is warm, dry and intact.  Psychiatric: Mood and  affect are normal. Speech and behavior are normal.   ____________________________________________    EKG  EKG viewed and interpreted by myself shows sinus tachycardia 104 bpm with a narrow QRS, normal axis, normal intervals, no concerning ST changes.  ____________________________________________    RADIOLOGY  Chest x-ray is negative  ____________________________________________   INITIAL IMPRESSION / ASSESSMENT AND PLAN / ED COURSE  Pertinent labs & imaging results that were available during my care of the patient were reviewed by me and considered in my medical decision making (see chart for details).   Patient presents emergency department for intermittent shortness of breath over the past 1 to 2 days ever since being exposed to a sanitizer at [employer] while cleaning shopping carts.  Patient does state occasional dry cough, mild shortness of breath at times.  Patient satting 100%  on room air at this time.  Overall the patient appears extremely well, clear lung sounds, normal heart sounds.  Patient's work-up is largely nonrevealing with a negative troponin.  Chest x-ray is clear, EKG is reassuring.  We will send an outpatient coronavirus swab for the patient.  I discussed supportive care and isolation at home until results are known.  Patient agreeable to plan of care.  Jasmine Gordon was evaluated in Emergency Department on 05/06/2019 for the symptoms described in the history of present illness. She was evaluated in the context of the global COVID-19 pandemic, which necessitated consideration that the patient might be at risk for infection with the SARS-CoV-2 virus that causes COVID-19. Institutional protocols and algorithms that pertain to the evaluation of patients at risk for COVID-19 are in a state of rapid change based on information released by regulatory bodies including the CDC and federal and state organizations. These policies and algorithms were followed during the patient's  care in the ED.  ____________________________________________   FINAL CLINICAL IMPRESSION(S) / ED DIAGNOSES  Dyspnea   Harvest Dark, MD 05/06/19 2246

## 2019-05-06 NOTE — ED Notes (Signed)
Signature pad not working at time of discharge.  Pt expresses verbal understanding of discharge paperwork with no further questions at this time.

## 2019-05-06 NOTE — ED Notes (Signed)
Radiology to bedside. 

## 2019-05-08 LAB — NOVEL CORONAVIRUS, NAA (HOSP ORDER, SEND-OUT TO REF LAB; TAT 18-24 HRS): SARS-CoV-2, NAA: NOT DETECTED

## 2019-05-15 ENCOUNTER — Emergency Department
Admission: EM | Admit: 2019-05-15 | Discharge: 2019-05-15 | Disposition: A | Discharge status: Home / Self Care | Payer: BC Managed Care – PPO | Attending: Emergency Medicine | Admitting: Emergency Medicine

## 2019-05-15 ENCOUNTER — Other Ambulatory Visit: Payer: Self-pay

## 2019-05-15 DIAGNOSIS — J4530 Mild persistent asthma, uncomplicated: Secondary | ICD-10-CM | POA: Insufficient documentation

## 2019-05-15 DIAGNOSIS — R42 Dizziness and giddiness: Secondary | ICD-10-CM | POA: Insufficient documentation

## 2019-05-15 DIAGNOSIS — Z79899 Other long term (current) drug therapy: Secondary | ICD-10-CM | POA: Insufficient documentation

## 2019-05-15 DIAGNOSIS — Z569 Unspecified problems related to employment: Secondary | ICD-10-CM

## 2019-05-15 DIAGNOSIS — Z565 Uncongenial work environment: Secondary | ICD-10-CM | POA: Diagnosis not present

## 2019-05-15 MED ORDER — SODIUM CHLORIDE 0.9 % IV BOLUS
500.0000 mL | Freq: Once | INTRAVENOUS | Status: AC
  Administered 2019-05-15: 500 mL via INTRAVENOUS

## 2019-05-15 NOTE — ED Triage Notes (Signed)
Pt c/o chest tightness and dizziness intermittently, states she thinks it is related to the strong cleaner they are using at work to clean the carts with , states its worse when she gets hot and better when she cools off. Pt is in NAD on arrival. States she was seen here for the same thing last week.

## 2019-05-15 NOTE — Discharge Instructions (Signed)
return to the emergency room for any new or worrisome symptoms.

## 2019-05-15 NOTE — ED Provider Notes (Addendum)
[facility] Emergency Department Provider Note  ____________________________________________   I have reviewed the triage vital signs and the nursing notes. Where available I have reviewed prior notes and, if possible and indicated, outside hospital notes.    HISTORY  Chief Complaint Dizziness    HPI Jasmine Gordon is a 33 y.o. female  Who is well-known to this emergency department patient seen and evaluated during the coronavirus epidemic during a time with low staffing, she is here because when she went to work today at [employer] she felt lightheaded and wanted to come in to be checked out.  She did not feel like she could work today.  Patient frequently is here for similar reasons.  She has had extensive ER work-ups for various complaints including I noticed for negative CT scan PE protocol of her chest in the last couple years several CT scans of her head etc.  States that she was feeling fine at home but as soon as she got to work she felt that she needed to be here instead.  She states she has insurance and that no one else is likely using their insurance and so she wanted to make sure that she was "checked out".  She states she might be dehydrated and that she is been eating and drinking well not have any weight loss.  She denies pregnancy.  She wants some IV fluid she states that will make her feel better.  She has no chest pain no shortness of breath no nausea no vomiting no abdominal pain no diarrhea no headache no stiff neck no chest pain no shortness of breath no cough has been tested negative for coronavirus twice in the last 2 months   Past Medical History:  Diagnosis Date  . Allergy   . Anemia   . Asthma   . Migraine   . Panic attack   . PTSD (post-traumatic stress disorder) 2016    Patient Active Problem List   Diagnosis Date Noted  . Genital warts 09/11/2018  . Positive TB test 09/25/2017  . Allergic contact dermatitis due to metals  05/17/2017  . Pre-diabetes 03/06/2017  . Hyperlipidemia 03/06/2017  . Allergic reaction 02/08/2017  . Urticaria due to food allergy 02/08/2017  . Vitamin D deficiency 11/08/2016  . Iron deficiency anemia due to chronic blood loss 10/03/2016  . Menorrhagia with regular cycle 06/27/2016  . Mild persistent asthma 06/17/2016  . Anxiety and depression 06/17/2016  . Morbid obesity with BMI of 40.0-44.9, adult ([place]) 06/17/2016    Past Surgical History:  Procedure Laterality Date  . APPENDECTOMY    . Plantar warts      Prior to Admission medications   Medication Sig Start Date End Date Taking? Authorizing Provider  albuterol (PROVENTIL HFA;VENTOLIN HFA) 108 (90 Base) MCG/ACT inhaler Inhale 2 puffs into the lungs every 6 (six) hours as needed for wheezing or shortness of breath. 02/14/19   Karamalegos, Devonne Doughty, DO  albuterol (PROVENTIL) (2.5 MG/3ML) 0.083% nebulizer solution Take 3 mLs (2.5 mg total) by nebulization every 6 (six) hours as needed for wheezing or shortness of breath. 11/06/18   Parks Ranger, Devonne Doughty, DO  benzonatate (TESSALON PERLES) 100 MG capsule Take 1-2 tabs TID prn cough 03/30/19   Menshew, Dannielle Karvonen, PA-C  Cholecalciferol (VITAMIN D-1000 MAX ST) 25 MCG (1000 UT) tablet Take by mouth.    [provider]  clotrimazole-betamethasone (LOTRISONE) cream Apply externally BID prn sx up to 2 wks 25/36/64   Copland, Deirdre Evener,  PA-C  cyclobenzaprine (FLEXERIL) 5 MG tablet Take 1 tablet (5 mg total) by mouth 3 (three) times daily as needed. 03/30/19   Menshew, Dannielle Karvonen, PA-C  EPINEPHrine 0.3 mg/0.3 mL IJ SOAJ injection INJECT INTRAMUSCULARLY AS DIRECTED 01/29/17   [provider]  fluticasone (FLONASE) 50 MCG/ACT nasal spray Place 1 spray into both nostrils daily. 08/20/18 08/20/19  Lannie Fields, PA-C  ibuprofen (ADVIL,MOTRIN) 600 MG tablet Take 1 tablet (600 mg total) by mouth every 8 (eight) hours as needed. 12/10/18   Darel Hong, MD  imiquimod  [place]) 5 % cream Apply topically 3 (three) times a week. Apply until total clearance or maximum of 12 weeks 07/24/80   Copland, Alicia B, PA-C  naproxen (NAPROSYN) 500 MG tablet Take 1 tablet (500 mg total) by mouth 2 (two) times daily with a meal. 12/18/18   Triplett, Cari B, FNP  ondansetron (ZOFRAN ODT) 4 MG disintegrating tablet Take 1 tablet (4 mg total) by mouth every 8 (eight) hours as needed. 03/30/19   Menshew, Dannielle Karvonen, PA-C  Respiratory Therapy Supplies (NEBULIZER/ADULT MASK) KIT Use nebulizer mask and supplies kit for nebulizer machine as needed 03/18/19   [name], DO    Allergies Reglan [metoclopramide]  Family History  Problem Relation Age of Onset  . Depression Mother   . Mental retardation Mother   . Bipolar disorder Mother   . Diabetes Father   . Stroke Maternal Grandfather   . Diabetes Maternal Grandfather     Social History Social History   Tobacco Use  . Smoking status: Never Smoker  . Smokeless tobacco: Never Used  Substance Use Topics  . Alcohol use: No  . Drug use: No    Review of Systems Constitutional: No fever/chills Eyes: No visual changes. ENT: No sore throat. No stiff neck no neck pain Cardiovascular: Denies chest pain. Respiratory: Denies shortness of breath. Gastrointestinal:   no vomiting.  No diarrhea.  No constipation. Genitourinary: Negative for dysuria. Musculoskeletal: Negative lower extremity swelling Skin: Negative for rash. Neurological: Negative for severe headaches, focal weakness or numbness.   ____________________________________________   PHYSICAL EXAM:  VITAL SIGNS: ED Triage Vitals  Enc Vitals Group     BP 05/15/19 0835 111/83     Pulse Rate 05/15/19 0835 (!) 107     Resp 05/15/19 0835 17     Temp 05/15/19 0835 99 F (37.2 C)     Temp Source 05/15/19 0835 Oral     SpO2 05/15/19 0835 98 %     Weight --      Height 05/15/19 0832 '5\' 7"'  (1.702 m)     Head Circumference --      Peak Flow --       Pain Score 05/15/19 0835 0     Pain Loc --      Pain Edu? --      Excl. in [place]? --     Constitutional: Alert and oriented. Well appearing and in no acute distress. Eyes: Conjunctivae are normal Head: Atraumatic HEENT: No congestion/rhinnorhea. Mucous membranes are moist.  Oropharynx non-erythematous Neck:   Nontender with no meningismus, no masses, no stridor Cardiovascular: Normal rate, regular rhythm. Grossly normal heart sounds.  Good peripheral circulation. Respiratory: Normal respiratory effort.  No retractions. Lungs CTAB. Abdominal: Soft and nontender. No distention. No guarding no rebound Back:  There is no focal tenderness or step off.  there is no midline tenderness there are no lesions noted. there is no CVA tenderness  Musculoskeletal:  No lower extremity tenderness, no upper extremity tenderness. No joint effusions, no DVT signs strong distal pulses no edema Neurologic:  Normal speech and language. No gross focal neurologic deficits are appreciated.  Skin:  Skin is warm, dry and intact. No rash noted. Psychiatric: Mood and affect are normal. Speech and behavior are normal.  ____________________________________________   LABS (all labs ordered are listed, but only abnormal results are displayed)  Labs Reviewed - No data to display  Pertinent labs  results that were available during my care of the patient were reviewed by me and considered in my medical decision making (see chart for details). ____________________________________________  EKG  I personally interpreted any EKGs ordered by me or triage Normal sinus rhythm no acute ST elevation or depression no changes from prior ____________________________________________  RADIOLOGY  Pertinent labs & imaging results that were available during my care of the patient were reviewed by me and considered in my medical decision making (see chart for details). If possible, patient and/or family made aware of any abnormal  findings.  No results found. ____________________________________________    PROCEDURES  Procedure(s) performed: None  Procedures  Critical Care performed: None  ____________________________________________   INITIAL IMPRESSION / ASSESSMENT AND PLAN / ED COURSE  Pertinent labs & imaging results that were available during my care of the patient were reviewed by me and considered in my medical decision making (see chart for details).  Is here because she wants IV fluid because she has a vague sense that she might be dehydrated also she does not want to be at work today.  No evidence of acute pathology multiple different visits for similar, extensively worked up in the past.  I told her that if we can get an IV on her after a stick we will give her IV fluid but I do not think is absolutely indicated at this time to do blood work or further work-up after that.  Heart rate is 89 now that she is more relaxed and her symptoms have actually resolved now that she is not at work.  I suggested to her that she consider talking to her job about different kind of work she could do.  She did not actually use the cleaner this time was to showing up at work and getting screened which made her anxious.    ____________________________________________   FINAL CLINICAL IMPRESSION(S) / ED DIAGNOSES  Final diagnoses:  None      This chart was dictated using voice recognition software.  Despite best efforts to proofread,  errors can occur which can change meaning.      Schuyler Amor, MD 05/15/19 5726    Schuyler Amor, MD 05/15/19 [phone]

## 2019-05-28 ENCOUNTER — Ambulatory Visit (INDEPENDENT_AMBULATORY_CARE_PROVIDER_SITE_OTHER): Payer: BC Managed Care – PPO | Admitting: Family Medicine

## 2019-05-28 ENCOUNTER — Other Ambulatory Visit: Payer: Self-pay

## 2019-05-28 ENCOUNTER — Telehealth: Payer: Self-pay | Admitting: *Deleted

## 2019-05-28 ENCOUNTER — Encounter: Payer: Self-pay | Admitting: Family Medicine

## 2019-05-28 DIAGNOSIS — J4530 Mild persistent asthma, uncomplicated: Secondary | ICD-10-CM

## 2019-05-28 DIAGNOSIS — Z20828 Contact with and (suspected) exposure to other viral communicable diseases: Secondary | ICD-10-CM

## 2019-05-28 DIAGNOSIS — Z20822 Contact with and (suspected) exposure to covid-19: Secondary | ICD-10-CM

## 2019-05-28 NOTE — Patient Instructions (Addendum)
Stay tuned for a call back from the [facility] - they will call you and arrange this today. If you do not hear back, can call them at [phone].  If negative test - they will call you with result. If abnormal or positive test you will be notified as well and our office will contact you to help further with treatment plan.  Your symptoms are mild at this time, and may not warrant any treatment, and this may not be COVID19. To be determined right now.  RECOMMENDED self quarantine to [place] - advised to avoid all exposure with others while during treatment. Should continue to quarantine for up to 7-10 days, pending resolution of symptoms, if symptoms resolve by 7 days and is afebrile >3 days - may STOP self quarantine at that time.  If symptoms do not resolve or significantly improve OR if WORSENING - fever / cough - or worsening shortness of breath - then should contact us and seek advice on next steps in treatment at home vs where/when to seek care at Urgent Care or Hospital ED for further intervention   Please schedule a Follow-up Appointment to: Return in about 1 week (around 06/04/2019), or if symptoms worsen or fail to improve.  If you have any other questions or concerns, please feel free to call the office or send a message through [place]. You may also schedule an earlier appointment if necessary.  Additionally, you may be receiving a survey about your experience at our office within a few days to 1 week by e-mail or mail. We value your feedback.  Nobie Putnam, DO [facility]

## 2019-05-28 NOTE — Telephone Encounter (Signed)
Pt called and message left on voicemail to return call to schedule COVID-19 testing. Order placed.

## 2019-05-28 NOTE — Progress Notes (Signed)
Virtual Visit via Telephone The purpose of this virtual visit is to provide medical care while limiting exposure to the novel coronavirus (COVID19) for both patient and office staff.  Consent was obtained for phone visit:  Yes.   Answered questions that patient had about telehealth interaction:  Yes.   I discussed the limitations, risks, security and privacy concerns of performing an evaluation and management service by telephone. I also discussed with the patient that there may be a patient responsible charge related to this service. The patient expressed understanding and agreed to proceed.  Patient Location: At work on lunch break Provider Location: [facility])   ---------------------------------------------------------------------- Chief Complaint  Patient presents with  . covid exposure    one of coworker might be poisitve she had fever but patient has HA, exhausted, lack of energy     S: Reviewed CMA documentation. I have called patient and gathered additional HPI as follows:  POSSIBLE COVID19 EXPOSURE / headache Reports that symptoms with chronic tension headaches, uses heating pad at times with improvement. She is concerned with recent exposure at work to employee who had fever and they were not tested for [place], that employee has been out of work, she is asking about testing. She is required to wear mask the whole day.; has asthma some breathing issue with mask  Patient currently works at [employer] Denies any high risk travel to areas of current concern for [place].  Denies any fevers, chills, sweats, body ache, cough, shortness of breath, sinus pain or pressure, abdominal pain, diarrhea  -------------------------------------------------------------------------- O: No physical exam performed due to remote telephone encounter.  -------------------------------------------------------------------------- A&P:  EXPOSURE TO COVID19 - Reassuring without  high risk symptoms - Afebrile, without dyspnea - Comorbid pulmonary conditions (asthma)   Ordered COVID19 testing through [place] testing pool - they will contact patient via mychart or phone to proceed with arranging date/time testing. Symptomatic medications as recommended if needed Given indirect exposure and not confirmed case, and she is asymptomatic, agree that she can return to work now with standard work precautions and mask, if symptoms needs to notify office and leave work   No orders of the defined types were placed in this encounter.   OPTIONAL RECOMMENDED self quarantine for patient safety for PREVENTION ONLY. It is not required based on current clinical symptoms. If they were to develop fever or worsening shortness of breath, then emphasis on REQUIRED quarantine for up to 7-14 days that could be resolved if fever free >3 days AND if symptoms improving after 7 days.   If symptoms do not resolve or significantly improve OR if WORSENING - fever / cough - or worsening shortness of breath - then should contact us and seek advice on next steps in treatment at home vs where/when to seek care at Urgent Care or Hospital ED for further intervention and possible testing if indicated.  Patient verbalizes understanding with the above medical recommendations including the limitation of remote medical advice.  Specific follow-up / call-back criteria were given for patient to follow-up or seek medical care more urgently if needed.   - Time spent in direct consultation with patient on phone: 15 minutes  Nobie Putnam, [facility] 05/28/2019, 11:44 AM

## 2019-05-28 NOTE — Telephone Encounter (Signed)
Requested COVID19 Testing Received: Today Message Contents  Parks Ranger, Devonne Doughty, DO  [facility] Testing Pool        Arvie [place] Kreitzer   33 y.o., 09/24/1986, F  MRN: [id number]   [phone] [facility])    MyChart is active.   Virtual visit today. She had exposure to coworker who may have had [place] unknown status of that coworker currently. Patient requesting testing, [place] site. She will need to coordinate when free to get tested.   Nobie Putnam, [facility]  05/28/2019, 11:54 AM

## 2019-05-29 ENCOUNTER — Other Ambulatory Visit: Payer: BC Managed Care – PPO

## 2019-05-29 DIAGNOSIS — Z20822 Contact with and (suspected) exposure to covid-19: Secondary | ICD-10-CM

## 2019-05-29 NOTE — Telephone Encounter (Signed)
Pt. Called back and scheduled for today. 

## 2019-06-03 LAB — NOVEL CORONAVIRUS, NAA: SARS-CoV-2, NAA: NOT DETECTED

## 2019-06-22 ENCOUNTER — Other Ambulatory Visit: Payer: Self-pay | Admitting: Obstetrics and Gynecology

## 2019-06-22 DIAGNOSIS — N760 Acute vaginitis: Secondary | ICD-10-CM

## 2019-06-24 MED ORDER — CLOTRIMAZOLE-BETAMETHASONE 1-0.05 % EX CREA: TOPICAL_CREAM | CUTANEOUS | 0 refills | Status: DC

## 2019-06-24 NOTE — Telephone Encounter (Signed)
Please advise 

## 2019-07-01 ENCOUNTER — Other Ambulatory Visit: Payer: Self-pay

## 2019-07-01 ENCOUNTER — Encounter: Payer: Self-pay | Admitting: Emergency Medicine

## 2019-07-01 DIAGNOSIS — R05 Cough: Secondary | ICD-10-CM | POA: Diagnosis not present

## 2019-07-01 DIAGNOSIS — Z20828 Contact with and (suspected) exposure to other viral communicable diseases: Secondary | ICD-10-CM | POA: Insufficient documentation

## 2019-07-01 DIAGNOSIS — R509 Fever, unspecified: Secondary | ICD-10-CM | POA: Insufficient documentation

## 2019-07-01 DIAGNOSIS — J45909 Unspecified asthma, uncomplicated: Secondary | ICD-10-CM | POA: Insufficient documentation

## 2019-07-01 DIAGNOSIS — Z79899 Other long term (current) drug therapy: Secondary | ICD-10-CM | POA: Insufficient documentation

## 2019-07-01 MED ORDER — ACETAMINOPHEN 500 MG PO TABS
ORAL_TABLET | ORAL | Status: AC
  Filled 2019-07-01: qty 2

## 2019-07-01 MED ORDER — ACETAMINOPHEN 500 MG PO TABS
1000.0000 mg | ORAL_TABLET | Freq: Once | ORAL | Status: AC
  Administered 2019-07-01: 1000 mg via ORAL

## 2019-07-01 NOTE — ED Triage Notes (Signed)
Pt reports she work at [employer] and has been having nasal drainage, congestion, fatigue and sweating x2 days. Pts son in ED as well for HA, emesis and chills.

## 2019-07-02 ENCOUNTER — Emergency Department
Admission: EM | Admit: 2019-07-02 | Discharge: 2019-07-02 | Disposition: A | Discharge status: Home / Self Care | Payer: BC Managed Care – PPO | Attending: Emergency Medicine | Admitting: Emergency Medicine

## 2019-07-02 ENCOUNTER — Emergency Department: Payer: BC Managed Care – PPO

## 2019-07-02 DIAGNOSIS — R05 Cough: Secondary | ICD-10-CM

## 2019-07-02 DIAGNOSIS — R059 Cough, unspecified: Secondary | ICD-10-CM

## 2019-07-02 DIAGNOSIS — R509 Fever, unspecified: Secondary | ICD-10-CM

## 2019-07-02 LAB — URINALYSIS, ROUTINE W REFLEX MICROSCOPIC
Bilirubin Urine: NEGATIVE
Glucose, UA: NEGATIVE mg/dL
Hgb urine dipstick: NEGATIVE
Ketones, ur: NEGATIVE mg/dL
Leukocytes,Ua: NEGATIVE
Nitrite: NEGATIVE
Protein, ur: NEGATIVE mg/dL
Specific Gravity, Urine: 1.017 (ref 1.005–1.030)
pH: 5 (ref 5.0–8.0)

## 2019-07-02 LAB — SARS CORONAVIRUS 2 (TAT 6-24 HRS): SARS Coronavirus 2: NEGATIVE

## 2019-07-02 LAB — POCT PREGNANCY, URINE: Preg Test, Ur: NEGATIVE

## 2019-07-02 NOTE — Discharge Instructions (Signed)
Your work-up was reassuring.  No evidence of pneumonia and no evidence of UTI.  You should monitor your symptoms.  Take Tylenol for your fevers.  Return to ER if you develop worsening symptoms such as abdominal pain, worsening vomiting or any other concerns.  Your coronavirus testing is pending.  He should stay quarantined at home until this resolves.     Person Under Monitoring Name: Jasmine Gordon  Location: [address] [place] [id number]   Infection Prevention Recommendations for Individuals Confirmed to have, or Being Evaluated for, 2019 Novel Coronavirus (COVID-19) Infection Who Receive Care at Home  Individuals who are confirmed to have, or are being evaluated for, COVID-19 should follow the prevention steps below until a healthcare provider or local or state health department says they can return to normal activities.  Stay home except to get medical care You should restrict activities outside your home, except for getting medical care. Do not go to work, school, or public areas, and do not use public transportation or taxis.  Call ahead before visiting your doctor Before your medical appointment, call the healthcare provider and tell them that you have, or are being evaluated for, COVID-19 infection. This will help the healthcare providers office take steps to keep other people from getting infected. Ask your healthcare provider to call the local or state health department.  Monitor your symptoms Seek prompt medical attention if your illness is worsening (e.g., difficulty breathing). Before going to your medical appointment, call the healthcare provider and tell them that you have, or are being evaluated for, COVID-19 infection. Ask your healthcare provider to call the local or state health department.  Wear a facemask You should wear a facemask that covers your nose and mouth when you are in the same room with other people and when you visit a healthcare  provider. People who live with or visit you should also wear a facemask while they are in the same room with you.  Separate yourself from other people in your home As much as possible, you should stay in a different room from other people in your home. Also, you should use a separate bathroom, if available.  Avoid sharing household items You should not share dishes, drinking glasses, cups, eating utensils, towels, bedding, or other items with other people in your home. After using these items, you should wash them thoroughly with soap and water.  Cover your coughs and sneezes Cover your mouth and nose with a tissue when you cough or sneeze, or you can cough or sneeze into your sleeve. Throw used tissues in a lined trash can, and immediately wash your hands with soap and water for at least 20 seconds or use an alcohol-based hand rub.  Wash your Union Pacific Corporationhands Wash your hands often and thoroughly with soap and water for at least 20 seconds. You can use an alcohol-based hand sanitizer if soap and water are not available and if your hands are not visibly dirty. Avoid touching your eyes, nose, and mouth with unwashed hands.   Prevention Steps for Caregivers and Household Members of Individuals Confirmed to have, or Being Evaluated for, COVID-19 Infection Being Cared for in the Home  If you live with, or provide care at home for, a person confirmed to have, or being evaluated for, COVID-19 infection please follow these guidelines to prevent infection:  Follow healthcare providers instructions Make sure that you understand and can help the patient follow any healthcare provider instructions for all care.  Provide for the patients basic needs You should help the patient with basic needs in the home and provide support for getting groceries, prescriptions, and other personal needs.  Monitor the patients symptoms If they are getting sicker, call his or her medical provider and tell them that the  patient has, or is being evaluated for, COVID-19 infection. This will help the healthcare providers office take steps to keep other people from getting infected. Ask the healthcare provider to call the local or state health department.  Limit the number of people who have contact with the patient If possible, have only one caregiver for the patient. Other household members should stay in another home or place of residence. If this is not possible, they should stay in another room, or be separated from the patient as much as possible. Use a separate bathroom, if available. Restrict visitors who do not have an essential need to be in the home.  Keep older adults, very young children, and other sick people away from the patient Keep older adults, very young children, and those who have compromised immune systems or chronic health conditions away from the patient. This includes people with chronic heart, lung, or kidney conditions, diabetes, and cancer.  Ensure good ventilation Make sure that shared spaces in the home have good air flow, such as from an air conditioner or an opened window, weather permitting.  Wash your hands often Wash your hands often and thoroughly with soap and water for at least 20 seconds. You can use an alcohol based hand sanitizer if soap and water are not available and if your hands are not visibly dirty. Avoid touching your eyes, nose, and mouth with unwashed hands. Use disposable paper towels to dry your hands. If not available, use dedicated cloth towels and replace them when they become wet.  Wear a facemask and gloves Wear a disposable facemask at all times in the room and gloves when you touch or have contact with the patients blood, body fluids, and/or secretions or excretions, such as sweat, saliva, sputum, nasal mucus, vomit, urine, or feces.  Ensure the mask fits over your nose and mouth tightly, and do not touch it during use. Throw out disposable facemasks  and gloves after using them. Do not reuse. Wash your hands immediately after removing your facemask and gloves. If your personal clothing becomes contaminated, carefully remove clothing and launder. Wash your hands after handling contaminated clothing. Place all used disposable facemasks, gloves, and other waste in a lined container before disposing them with other household waste. Remove gloves and wash your hands immediately after handling these items.  Do not share dishes, glasses, or other household items with the patient Avoid sharing household items. You should not share dishes, drinking glasses, cups, eating utensils, towels, bedding, or other items with a patient who is confirmed to have, or being evaluated for, COVID-19 infection. After the person uses these items, you should wash them thoroughly with soap and water.  Wash laundry thoroughly Immediately remove and wash clothes or bedding that have blood, body fluids, and/or secretions or excretions, such as sweat, saliva, sputum, nasal mucus, vomit, urine, or feces, on them. Wear gloves when handling laundry from the patient. Read and follow directions on labels of laundry or clothing items and detergent. In general, wash and dry with the warmest temperatures recommended on the label.  Clean all areas the individual has used often Clean all touchable surfaces, such as counters, tabletops, doorknobs, bathroom fixtures, toilets, phones, keyboards, tablets,  and bedside tables, every day. Also, clean any surfaces that may have blood, body fluids, and/or secretions or excretions on them. Wear gloves when cleaning surfaces the patient has come in contact with. Use a diluted bleach solution (e.g., dilute bleach with 1 part bleach and 10 parts water) or a household disinfectant with a label that says EPA-registered for coronaviruses. To make a bleach solution at home, add 1 tablespoon of bleach to 1 quart (4 cups) of water. For a larger supply,  add  cup of bleach to 1 gallon (16 cups) of water. Read labels of cleaning products and follow recommendations provided on product labels. Labels contain instructions for safe and effective use of the cleaning product including precautions you should take when applying the product, such as wearing gloves or eye protection and making sure you have good ventilation during use of the product. Remove gloves and wash hands immediately after cleaning.  Monitor yourself for signs and symptoms of illness Caregivers and household members are considered close contacts, should monitor their health, and will be asked to limit movement outside of the home to the extent possible. Follow the monitoring steps for close contacts listed on the symptom monitoring form.   ? If you have additional questions, contact your local health department or call the epidemiologist on call at [phone] (available 24/7). ? This guidance is subject to change. For the most up-to-date guidance from [facility], please refer to their website: [email or web address]

## 2019-07-02 NOTE — ED Provider Notes (Signed)
[facility] Emergency Department Provider Note  ____________________________________________   First MD Initiated Contact with Patient 07/02/19 0142     (approximate)  I have reviewed the triage vital signs and the nursing notes.   HISTORY  Chief Complaint Fever, Fatigue, and Emesis    HPI Jasmine Gordon is a 33 y.o. female with anemia, PTSD, asthma who presents for multiple symptoms.  Patient works at [employer] is been having nasal drainage, congestion, fatigue and sweating x2-day.  Patient says she has a pressure on her sinuses that is constant, 2 days, nothing makes better, nothing makes it worse.  She had 1 episode of vomiting today.  Denies any sore throat.  No abdominal pain has Already had her appendix removed. Pt was febrile here but not aware that she was febrile before today.    Past Medical History:  Diagnosis Date  . Allergy   . Anemia   . Asthma   . Migraine   . Panic attack   . PTSD (post-traumatic stress disorder) 2016    Patient Active Problem List   Diagnosis Date Noted  . Genital warts 09/11/2018  . Positive TB test 09/25/2017  . Allergic contact dermatitis due to metals 05/17/2017  . Pre-diabetes 03/06/2017  . Hyperlipidemia 03/06/2017  . Allergic reaction 02/08/2017  . Urticaria due to food allergy 02/08/2017  . Vitamin D deficiency 11/08/2016  . Iron deficiency anemia due to chronic blood loss 10/03/2016  . Menorrhagia with regular cycle 06/27/2016  . Mild persistent asthma 06/17/2016  . Anxiety and depression 06/17/2016  . Morbid obesity with BMI of 40.0-44.9, adult ([place]) 06/17/2016    Past Surgical History:  Procedure Laterality Date  . APPENDECTOMY    . Plantar warts      Prior to Admission medications   Medication Sig Start Date End Date Taking? Authorizing Provider  albuterol (PROVENTIL HFA;VENTOLIN HFA) 108 (90 Base) MCG/ACT inhaler Inhale 2 puffs into the lungs every 6 (six) hours as needed for wheezing  or shortness of breath. 02/14/19   Karamalegos, Devonne Doughty, DO  albuterol (PROVENTIL) (2.5 MG/3ML) 0.083% nebulizer solution Take 3 mLs (2.5 mg total) by nebulization every 6 (six) hours as needed for wheezing or shortness of breath. 11/06/18   Karamalegos, Devonne Doughty, DO  Cholecalciferol (VITAMIN D-1000 MAX ST) 25 MCG (1000 UT) tablet Take by mouth.    [provider]  clotrimazole-betamethasone (LOTRISONE) cream Apply externally BID prn sx up to 2 wks 01/26/15   Copland, Alicia B, PA-C  cyclobenzaprine (FLEXERIL) 5 MG tablet Take 1 tablet (5 mg total) by mouth 3 (three) times daily as needed. 03/30/19   Menshew, Dannielle Karvonen, PA-C  EPINEPHrine 0.3 mg/0.3 mL IJ SOAJ injection INJECT INTRAMUSCULARLY AS DIRECTED 01/29/17   [provider]  fluticasone (FLONASE) 50 MCG/ACT nasal spray Place 1 spray into both nostrils daily. 08/20/18 08/20/19  Lannie Fields, PA-C  ibuprofen (ADVIL,MOTRIN) 600 MG tablet Take 1 tablet (600 mg total) by mouth every 8 (eight) hours as needed. 12/10/18   Darel Hong, MD  imiquimod [place]) 5 % cream Apply topically 3 (three) times a week. Apply until total clearance or maximum of 12 weeks 07/27/77   Copland, Alicia B, PA-C  naproxen (NAPROSYN) 500 MG tablet Take 1 tablet (500 mg total) by mouth 2 (two) times daily with a meal. 12/18/18   Triplett, Cari B, FNP  Respiratory Therapy Supplies (NEBULIZER/ADULT MASK) KIT Use nebulizer mask and supplies kit for nebulizer machine as needed 03/18/19  [name], DO    Allergies Reglan [metoclopramide]  Family History  Problem Relation Age of Onset  . Depression Mother   . Mental retardation Mother   . Bipolar disorder Mother   . Diabetes Father   . Stroke Maternal Grandfather   . Diabetes Maternal Grandfather     Social History Social History   Tobacco Use  . Smoking status: Never Smoker  . Smokeless tobacco: Never Used  Substance Use Topics  . Alcohol use: No  . Drug use: No       Review of Systems Constitutional: Positive fever, positive fatigue Eyes: No visual changes. ENT: No sore throat.  Positive congestion Cardiovascular: Denies chest pain. Respiratory: Positive cough, shortness of breath Gastrointestinal: No abdominal pain.  No nausea, no vomiting.  No diarrhea.  No constipation. Genitourinary: Negative for dysuria. Musculoskeletal: Negative for back pain. Skin: Negative for rash. Neurological: Negative for headaches, focal weakness or numbness. All other ROS negative ____________________________________________   PHYSICAL EXAM:  VITAL SIGNS: ED Triage Vitals [07/01/19 2244]  Enc Vitals Group     BP (!) 161/100     Pulse Rate (!) 112     Resp 20     Temp 100.2 F (37.9 C)     Temp Source Oral     SpO2 100 %     Weight      Height      Head Circumference      Peak Flow      Pain Score      Pain Loc      Pain Edu?      Excl. in [place]?     Constitutional: Alert and oriented. Well appearing and in no acute distress. Eyes: Conjunctivae are normal. EOMI. Head: Atraumatic. Nose: No congestion/rhinnorhea. Mouth/Throat: Mucous membranes are moist.  OP clear.  Neck: No stridor. Trachea Midline. FROM Cardiovascular: Normal rate, regular rhythm. Grossly normal heart sounds.  Good peripheral circulation. Respiratory: Normal respiratory effort.  No retractions. Lungs CTAB. Gastrointestinal: Soft and nontender. No distention. No abdominal bruits.  Musculoskeletal: No lower extremity tenderness nor edema.  No joint effusions. Neurologic:  Normal speech and language. No gross focal neurologic deficits are appreciated.  Skin:  Skin is warm, dry and intact. No rash noted. Psychiatric: Mood and affect are normal. Speech and behavior are normal. GU: Deferred   ____________________________________________   LABS (all labs ordered are listed, but only abnormal results are displayed)  Labs Reviewed  URINALYSIS, ROUTINE W REFLEX MICROSCOPIC - Abnormal;  Notable for the following components:      Result Value   Color, Urine YELLOW (*)    APPearance HAZY (*)    All other components within normal limits  POC URINE PREG, ED  POCT PREGNANCY, URINE   ____________________________________________   RADIOLOGY Robert Bellow, personally viewed and evaluated these images (plain radiographs) as part of my medical decision making, as well as reviewing the written report by the radiologist.  ED MD interpretation:  No PNA  Official radiology report(s): Dg Chest Portable 1 View  Result Date: 07/02/2019 CLINICAL DATA:  Shortness of breath EXAM: PORTABLE CHEST 1 VIEW COMPARISON:  05/06/2019 FINDINGS: Lungs are clear.  No pleural effusion or pneumothorax. The heart is normal in size. IMPRESSION: No evidence of acute cardiopulmonary disease. Electronically Signed   By: Julian Hy M.D.   On: 07/02/2019 03:08    ____________________________________________   PROCEDURES  Procedure(s) performed (including Critical Care):  Procedures   ____________________________________________   INITIAL IMPRESSION /  ASSESSMENT AND PLAN / ED COURSE  Jonel Maram Bently was evaluated in Emergency Department on 07/02/2019 for the symptoms described in the history of present illness. She was evaluated in the context of the global COVID-19 pandemic, which necessitated consideration that the patient might be at risk for infection with the SARS-CoV-2 virus that causes COVID-19. Institutional protocols and algorithms that pertain to the evaluation of patients at risk for COVID-19 are in a state of rapid change based on information released by regulatory bodies including the CDC and federal and state organizations. These policies and algorithms were followed during the patient's care in the ED.    Patient is a well-appearing 33 year old who presented febrile and tachycardic.  Patient received 1 g of Tylenol and her temperature went down and her heart rate went down  as well.  Patient had a normal oropharynx no evidence of PTA.  Full range of motion of neck so low suspicion for RPA or meningitis.  Will get urine to evaluate for UTI and chest x-ray to evaluate for pneumonia.  No abdominal tenderness to suggest abdominal infection.  No skin sources.  Given patient's multitude of symptoms and her son is also presenting with similar symptoms this is most concerning for viral illness.  Will get coronavirus testing and have patient quarantine at home until results come back.  Discussed return precautions relation to her fever.   Urine is without evidence of UTI.  Pregnancy test is negative.  Chest x-ray without evidence of pneumonia  Today's Vitals   07/01/19 2244 07/02/19 0145  BP: (!) 161/100 128/79  Pulse: (!) 112 95  Resp: 20 18  Temp: 100.2 F (37.9 C) 98.9 F (37.2 C)  TempSrc: Oral Oral  SpO2: 100% 100%  PainSc:  0-No pain      ____________________________________________   FINAL CLINICAL IMPRESSION(S) / ED DIAGNOSES   Final diagnoses:  None      MEDICATIONS GIVEN DURING THIS VISIT:  Medications  acetaminophen (TYLENOL) tablet 1,000 mg (1,000 mg Oral Given 07/01/19 2250)     ED Discharge Orders    None       Note:  This document was prepared using Dragon voice recognition software and may include unintentional dictation errors.   Vanessa Kendall, MD 07/02/19 [phone]

## 2019-07-02 NOTE — ED Notes (Signed)
Report given to Katy RN.

## 2019-07-12 ENCOUNTER — Telehealth: Payer: Self-pay | Admitting: Medical Oncology

## 2019-07-12 NOTE — Telephone Encounter (Signed)
err

## 2019-07-15 ENCOUNTER — Ambulatory Visit (INDEPENDENT_AMBULATORY_CARE_PROVIDER_SITE_OTHER)
Admission: RE | Admit: 2019-07-15 | Discharge: 2019-07-15 | Disposition: A | Discharge status: Home / Self Care | Payer: BC Managed Care – PPO | Source: Ambulatory Visit

## 2019-07-15 DIAGNOSIS — R1012 Left upper quadrant pain: Secondary | ICD-10-CM | POA: Diagnosis not present

## 2019-07-15 NOTE — Discharge Instructions (Addendum)
Important to keep symptom log as discussed. Keep appoint with PCP on Wednesday 8/26: Recommends pain in person evaluation so that a physical and diagnostics can be done. Return for worsening pain, difficulty breathing, fever.

## 2019-07-15 NOTE — ED Provider Notes (Signed)
Virtual Visit via Video Note:  Jasmine Gordon  initiated request for Telemedicine visit with [facility] team. I connected with Jasmine Gordon  on 07/15/2019 at 12:37 PM  for a synchronized telemedicine visit using a video enabled HIPPA compliant telemedicine application. I verified that I am speaking with Jasmine Gordon  using two identifiers. Jasmine Hall-Potvin, PA-C  was physically located in a [facility] site and Jasmine Gordon was located at a different location.   The limitations of evaluation and management by telemedicine as well as the availability of in-person appointments were discussed. Patient was informed that she  may incur a bill ( including co-pay) for this virtual visit encounter. Jasmine Gordon  expressed understanding and gave verbal consent to proceed with virtual visit.     History of Present Illness: Jasmine Gordon is a 33 y.o. female with history of asthma, anemia, allergies, anxiety presenting for left-sided rib pain.  Patient states this is been going on "for a long time ", though went away for a while and began recurring a few days ago.  Patient states it is a nagging sensation: Denies throbbing, sharp shooting, pressure sensation.  Patient denies retrosternal chest pain, shortness of breath, fever.  Patient states that she has had a CAT scan done in the past: Per chart review done by me, this was performed in January 2020, negative for acute pathology.  Patient was followed by GI for a while as I thought it could be related to increased gas.  Patient denies increased belching, epigastric burning sensation, reflux, nausea, vomiting, abdominal distention, hematochezia, melena, painful bowel movements.  Overall, patient has difficulty listing specific exacerbating or/alleviators..  She has not taken any medications for this.  Of note, patient has appointment with PCP scheduled for this Wednesday, intends to keep  it.  Past Medical History:  Diagnosis Date  . Allergy   . Anemia   . Asthma   . Migraine   . Panic attack   . PTSD (post-traumatic stress disorder) 2016    Allergies  Allergen Reactions  . Reglan [Metoclopramide] Hives and Shortness Of Breath        Observations/Objective: 33 year old obese female sitting in no acute distress.  Patient is able to speak in full sentences without coughing, sneezing, wheezing.  Assessment and Plan: 1.  Left upper quadrant pain History suggest benign process, though limited due to lack of consistent aggravating/alleviating factors.  Recommended patient keep symptom & food log, and keep PCP appointment, to be done in person, for physical exam, possible diagnostics.  Patient is agreeable to this as she does not want to come to urgent care today.  Patient to try heating pad, ibuprofen if needed when symptomatic to see if this present relief.  Follow Up Instructions: Patient to see PCP on Wednesday 8/26.  Return precautions discussed, patient verbalized understanding and is agreeable to plan.   I discussed the assessment and treatment plan with the patient. The patient was provided an opportunity to ask questions and all were answered. The patient agreed with the plan and demonstrated an understanding of the instructions.   The patient was advised to call back or seek an in-person evaluation if the symptoms worsen or if the condition fails to improve as anticipated.  I provided 20 minutes of non-face-to-face time during this encounter.    Jasmine Hall-Potvin, PA-C  07/15/2019 12:37 PM        Gordon, Jasmine, [place] 07/15/19 1246

## 2019-07-17 ENCOUNTER — Other Ambulatory Visit: Payer: Self-pay

## 2019-07-17 ENCOUNTER — Encounter: Payer: Self-pay | Admitting: Family Medicine

## 2019-07-17 ENCOUNTER — Ambulatory Visit (INDEPENDENT_AMBULATORY_CARE_PROVIDER_SITE_OTHER): Payer: BC Managed Care – PPO | Admitting: Family Medicine

## 2019-07-17 DIAGNOSIS — R109 Unspecified abdominal pain: Secondary | ICD-10-CM

## 2019-07-17 DIAGNOSIS — R140 Abdominal distension (gaseous): Secondary | ICD-10-CM | POA: Diagnosis not present

## 2019-07-17 DIAGNOSIS — R1012 Left upper quadrant pain: Secondary | ICD-10-CM | POA: Diagnosis not present

## 2019-07-17 MED ORDER — DICYCLOMINE HCL 10 MG PO CAPS: 10.0000 mg | ORAL_CAPSULE | Freq: Three times a day (TID) | ORAL | 2 refills | Status: DC

## 2019-07-17 NOTE — Patient Instructions (Addendum)
Try the dicyclomine as needed with meals and bed time for abdominal cramping - if don't need it then can hold dose.  Follow back up with [facility] - you were seen 09/2018 and they likely have more testing and information and treatment for you - please call and schedule with them next  Please schedule a Follow-up Appointment to: Return if symptoms worsen or fail to improve.  If you have any other questions or concerns, please feel free to call the office or send a message through [facility]. You may also schedule an earlier appointment if necessary.  Additionally, you may be receiving a survey about your experience at our office within a few days to 1 week by e-mail or mail. We value your feedback.  Nobie Putnam, DO [place]

## 2019-07-17 NOTE — Progress Notes (Signed)
Virtual Visit via Telephone The purpose of this virtual visit is to provide medical care while limiting exposure to the novel coronavirus (COVID19) for both patient and office staff.  Consent was obtained for phone visit:  Yes.   Answered questions that patient had about telehealth interaction:  Yes.   I discussed the limitations, risks, security and privacy concerns of performing an evaluation and management service by telephone. I also discussed with the patient that there may be a patient responsible charge related to this service. The patient expressed understanding and agreed to proceed.  Patient Location: Home Provider Location: [facility])  ---------------------------------------------------------------------- Chief Complaint  Patient presents with  . Abdominal Pain    under left side rib pain- Had this over 12 mo ago and had imaging done with no abnormality. It is worse now pain at 10. Has had BM WNL. Denies NVD    S: Reviewed CMA documentation. I have called patient and gathered additional HPI as follows:  ABDOMINAL LUQ EPISODIC PAIN, CHRONIC - Last visit for same problem 07/15/19 at [facility], for same issue, treated with review of chart see below, advised to f/u with PCP seems to be functional GI symptoms chronic, see prior notes for background information. - Background chart review, seen in 08/2018 for this same problem and referred to GI - she was seen by [facility] at that time had Abdominal Ultrasound and other testing, at that time in 08/2018 she reported having this problem for >10 months prior, therefore, has been present for about past 2 years or more. She was on dicyclomine in 2017 from chart review, does not recall specifically if helpful. She has been on other meds for acid reflux in past. She had imaging with Abd [place] negative 09/2018, she had had other CT imaging as well. - She has not returned to [facility] since initial visit 08/2018 -  Today patient reports still has same problem, episodic off and on symptoms mostly upper abdomen and LUQ area burping gas bloating pain, some nausea, she says worse if working a lot and active. She denies any injury or other complication  Denies any high risk travel to areas of current concern for COVID19. Denies any known or suspected exposure to person with or possibly with COVID19.  Denies any fevers, chills, sweats, body ache, cough, shortness of breath, sinus pain or pressure, headache, diarrhea, dark stool or blood in stool  Past Medical History:  Diagnosis Date  . Allergy   . Anemia   . Asthma   . Migraine   . Panic attack   . PTSD (post-traumatic stress disorder) 2016   Social History   Tobacco Use  . Smoking status: Never Smoker  . Smokeless tobacco: Never Used  Substance Use Topics  . Alcohol use: No  . Drug use: No    Current Outpatient Medications:  .  albuterol (PROVENTIL HFA;VENTOLIN HFA) 108 (90 Base) MCG/ACT inhaler, Inhale 2 puffs into the lungs every 6 (six) hours as needed for wheezing or shortness of breath., Disp: 1 Inhaler, Rfl: 2 .  albuterol (PROVENTIL) (2.5 MG/3ML) 0.083% nebulizer solution, Take 3 mLs (2.5 mg total) by nebulization every 6 (six) hours as needed for wheezing or shortness of breath., Disp: 150 mL, Rfl: 1 .  Cholecalciferol (VITAMIN D-1000 MAX ST) 25 MCG (1000 UT) tablet, Take by mouth., Disp: , Rfl:  .  clotrimazole-betamethasone (LOTRISONE) cream, Apply externally BID prn sx up to 2 wks, Disp: 15 g, Rfl: 0 .  fluticasone (FLONASE) 50 MCG/ACT nasal spray, Place 1 spray into both nostrils daily., Disp: 16 g, Rfl: 2 .  ibuprofen (ADVIL,MOTRIN) 600 MG tablet, Take 1 tablet (600 mg total) by mouth every 8 (eight) hours as needed., Disp: 30 tablet, Rfl: 0 .  imiquimod (ALDARA) 5 % cream, Apply topically 3 (three) times a week. Apply until total clearance or maximum of 12 weeks, Disp: 12 each, Rfl: 2 .  Respiratory Therapy Supplies (NEBULIZER/ADULT  MASK) KIT, Use nebulizer mask and supplies kit for nebulizer machine as needed, Disp: 1 each, Rfl: 0 .  dicyclomine (BENTYL) 10 MG capsule, Take 1 capsule (10 mg total) by mouth 4 (four) times daily -  before meals and at bedtime. As needed for abdominal cramping bloating, Disp: 30 capsule, Rfl: 2  Depression screen [facility] 2/9 05/28/2019 11/06/2018 09/14/2018  Decreased Interest 2 0 0  Down, Depressed, Hopeless 1 0 0  PHQ - 2 Score 3 0 0  Altered sleeping 2 0 -  Tired, decreased energy 3 0 -  Change in appetite 1 0 -  Feeling bad or failure about yourself  1 0 -  Trouble concentrating 1 0 -  Moving slowly or fidgety/restless 1 0 -  Suicidal thoughts 0 0 -  PHQ-9 Score 12 0 -  Difficult doing work/chores Somewhat difficult Not difficult at all -    GAD 7 : Generalized Anxiety Score 05/28/2019 11/06/2018 07/25/2018  Nervous, Anxious, on Edge 2 0 (No Data)  Control/stop worrying 1 0 -  Worry too much - different things 2 0 -  Trouble relaxing 3 0 -  Restless 2 0 -  Easily annoyed or irritable 1 0 -  Afraid - awful might happen 3 0 -  Total GAD 7 Score 14 0 -  Anxiety Difficulty Somewhat difficult Not difficult at all -    -------------------------------------------------------------------------- O: No physical exam performed due to remote telephone encounter.  Lab results reviewed.  I have personally reviewed the radiology report from 09/27/18 Abd [place].  CLINICAL DATA:  Abdominal pain bloating  EXAM: ABDOMEN ULTRASOUND COMPLETE  COMPARISON:  07/26/2018  FINDINGS: Gallbladder: No gallstones or wall thickening visualized. No sonographic Murphy sign noted by sonographer.  Common bile duct: Diameter: 3.6 mm  Liver: No focal lesion identified. Within normal limits in parenchymal echogenicity. Portal vein is patent on color Doppler imaging with normal direction of blood flow towards the liver.  IVC: No abnormality visualized.  Pancreas: Visualized portion  unremarkable.  Spleen: Size and appearance within normal limits.  Right Kidney: Length: 11.1 cm. Echogenicity within normal limits. No mass or hydronephrosis visualized.  Left Kidney: Length: 10.5 cm. Echogenicity within normal limits. No mass or hydronephrosis visualized.  Abdominal aorta: No aneurysm visualized.  Other findings: None.  IMPRESSION: Negative abdominal ultrasound   Electronically Signed   By: Donavan Foil M.D.   On: 09/27/2018 15:30  Recent Results (from the past 2160 hour(s))  Basic metabolic panel     Status: Abnormal   Collection Time: 05/06/19  7:53 PM  Result Value Ref Range   Sodium 138 135 - 145 mmol/L   Potassium 3.7 3.5 - 5.1 mmol/L   Chloride 105 98 - 111 mmol/L   CO2 23 22 - 32 mmol/L   Glucose, Bld 105 (H) 70 - 99 mg/dL   BUN 14 6 - 20 mg/dL   Creatinine, Ser 0.69 0.44 - 1.00 mg/dL   Calcium 9.0 8.9 - 10.3 mg/dL   GFR calc non Af Amer >60 >60 mL/min  GFR calc Af Amer >60 >60 mL/min   Anion gap 10 5 - 15    Comment: Performed at [facility], [address] [place], [place] [address]  CBC     Status: Abnormal   Collection Time: 05/06/19  7:53 PM  Result Value Ref Range   WBC 6.1 4.0 - 10.5 K/uL   RBC 4.49 3.87 - 5.11 MIL/uL   Hemoglobin 11.9 (L) 12.0 - 15.0 g/dL   HCT 38.7 36.0 - 46.0 %   MCV 86.2 80.0 - 100.0 fL   MCH 26.5 26.0 - 34.0 pg   MCHC 30.7 30.0 - 36.0 g/dL   RDW 12.7 11.5 - 15.5 %   Platelets 236 150 - 400 K/uL   nRBC 0.0 0.0 - 0.2 %    Comment: Performed at [facility], [address] [place], [place] [address]  Troponin I - ONCE - STAT     Status: None   Collection Time: 05/06/19  7:53 PM  Result Value Ref Range   Troponin I <0.03 <0.03 ng/mL    Comment: Performed at [facility], [address] [place], [place] [address]  Pregnancy, urine POC     Status: None   Collection Time: 05/06/19  8:15 PM  Result Value Ref Range   Preg Test, Ur NEGATIVE NEGATIVE    Comment:         THE SENSITIVITY OF THIS METHODOLOGY IS >24 mIU/mL   Novel Coronavirus,NAA,(SEND-OUT TO REF LAB - TAT 24-48 hrs); Hosp Order     Status: None   Collection Time: 05/06/19 11:13 PM   Specimen: Nasopharyngeal Swab; Respiratory  Result Value Ref Range   SARS-CoV-2, NAA NOT DETECTED NOT DETECTED    Comment: (NOTE) This test was developed and its performance characteristics determined by [employer]. This test has not been FDA cleared or approved. This test has been authorized by FDA under an Emergency Use Authorization (EUA). This test is only authorized for the duration of time the declaration that circumstances exist justifying the authorization of the emergency use of in vitro diagnostic tests for detection of SARS-CoV-2 virus and/or diagnosis of COVID-19 infection under section 564(b)(1) of the Act, 21 U.S.C. 938BOF-7(P)(1), unless the authorization is terminated or revoked sooner. When diagnostic testing is negative, the possibility of a false negative result should be considered in the context of a patient's recent exposures and the presence of clinical signs and symptoms consistent with COVID-19. An individual without symptoms of COVID-19 and who is not shedding SARS-CoV-2 virus would expect to have a negative (not detected) result in this assay. Performed  At: [facility] [address] [place], [place] [id number] Rush Farmer MD EU:[id number]    Coronavirus Source NASOPHARYNGEAL     Comment: Performed at [facility], [address] [place], [place] [address]  Novel Coronavirus, NAA ([facility])     Status: None   Collection Time: 05/29/19  1:11 PM  Result Value Ref Range   SARS-CoV-2, NAA Not Detected Not Detected    Comment: Testing was performed using the cobas(R) SARS-CoV-2 test. This test was developed and its performance characteristics determined by [employer]. This test has not been FDA cleared or approved. This test has been  authorized by FDA under an Emergency Use Authorization (EUA). This test is only authorized for the duration of time the declaration that circumstances exist justifying the authorization of the emergency use of in vitro diagnostic tests for detection of SARS-CoV-2 virus and/or diagnosis of COVID-19 infection  under section 564(b)(1) of the Act, 21 U.S.C. 067PCH-4(K)(3), unless the authorization is terminated or revoked sooner. When diagnostic testing is negative, the possibility of a false negative result should be considered in the context of a patient's recent exposures and the presence of clinical signs and symptoms consistent with COVID-19. An individual without symptoms of COVID-19 and who is not shedding SARS-CoV-2 virus would expect to have a negati ve (not detected) result in this assay.   Urinalysis, Routine w reflex microscopic     Status: Abnormal   Collection Time: 07/02/19  2:51 AM  Result Value Ref Range   Color, Urine YELLOW (A) YELLOW   APPearance HAZY (A) CLEAR   Specific Gravity, Urine 1.017 1.005 - 1.030   pH 5.0 5.0 - 8.0   Glucose, UA NEGATIVE NEGATIVE mg/dL   Hgb urine dipstick NEGATIVE NEGATIVE   Bilirubin Urine NEGATIVE NEGATIVE   Ketones, ur NEGATIVE NEGATIVE mg/dL   Protein, ur NEGATIVE NEGATIVE mg/dL   Nitrite NEGATIVE NEGATIVE   Leukocytes,Ua NEGATIVE NEGATIVE    Comment: Performed at [facility], [address] [place], [place] [address]  Pregnancy, urine POC     Status: None   Collection Time: 07/02/19  2:55 AM  Result Value Ref Range   Preg Test, Ur NEGATIVE NEGATIVE    Comment:        THE SENSITIVITY OF THIS METHODOLOGY IS >24 mIU/mL   SARS CORONAVIRUS 2 Nasal Swab Aptima Multi Swab     Status: None   Collection Time: 07/02/19  3:38 AM   Specimen: Aptima Multi Swab; Nasal Swab  Result Value Ref Range   SARS Coronavirus 2 NEGATIVE NEGATIVE    Comment: (NOTE) SARS-CoV-2 target nucleic acids are NOT DETECTED. The SARS-CoV-2 RNA is  generally detectable in upper and lower respiratory specimens during the acute phase of infection. Negative results do not preclude SARS-CoV-2 infection, do not rule out co-infections with other pathogens, and should not be used as the sole basis for treatment or other patient management decisions. Negative results must be combined with clinical observations, patient history, and epidemiological information. The expected result is Negative. Fact Sheet for Patients: SugarRoll.be Fact Sheet for Healthcare Providers: https://www.woods-mathews.com/ This test is not yet approved or cleared by the [place] FDA and  has been authorized for detection and/or diagnosis of SARS-CoV-2 by FDA under an Emergency Use Authorization (EUA). This EUA will remain  in effect (meaning this test can be used) for the duration of the COVID-19 declaration under Section 56 4(b)(1) of the Act, 21 U.S.C. section 360bbb-3(b)(1), unless the authorization is terminated or revoked sooner. Performed at [facility], [place] [address] [place], [place] [address]     -------------------------------------------------------------------------- A&P:  Problem List Items Addressed This Visit    None    Visit Diagnoses    Abdominal cramping    -  Primary   Relevant Medications   dicyclomine (BENTYL) 10 MG capsule   Abdominal bloating       Relevant Medications   dicyclomine (BENTYL) 10 MG capsule   Colicky LUQ abdominal pain       Relevant Medications   dicyclomine (BENTYL) 10 MG capsule     Clinically with functional GI symptoms based on history and clinical course No other focal concerns or acute GI concerns, has had multiple ED visits in past for similar issue, prior work up and imaging unremarkable, see [place] 09/2018  Lost to follow-up with [facility]   Plan Discussed with patient that  her symptoms given duration >2-3 years or longer, seem consistent based on virtual  visit today with functional GI symptoms, such as abdominal bloating / cramping / excess gas etc, can also be related at times to gastritis or other cause such as dyspepsia etc.  Trial on Dicyclomine 54m QID PRN wc and at night if need for abdominal cramp relief She can take OTC meds as well PRN  [facility] and diagnosis and treatment, likely has a type of IBS or other functional syndrome, will need more evaluation.  She can notify [place] needs new referral, otherwise has been < 1 year since last visit. She has not established with [facility] - but we can consider transfer of care in future if needed.  Return precautions given.  Meds ordered this encounter  Medications  . dicyclomine (BENTYL) 10 MG capsule    Sig: Take 1 capsule (10 mg total) by mouth 4 (four) times daily -  before meals and at bedtime. As needed for abdominal cramping bloating    Dispense:  30 capsule    Refill:  2    Follow-up: - Return as needed  Patient verbalizes understanding with the above medical recommendations including the limitation of remote medical advice.  Specific follow-up and call-back criteria were given for patient to follow-up or seek medical care more urgently if needed.   - Time spent in direct consultation with patient on phone: 9 minutes   ANobie Putnam [facility] 07/17/2019, 10:56 AM

## 2019-07-20 ENCOUNTER — Encounter: Payer: Self-pay | Admitting: Emergency Medicine

## 2019-07-20 ENCOUNTER — Other Ambulatory Visit: Payer: Self-pay

## 2019-07-20 ENCOUNTER — Emergency Department: Payer: BC Managed Care – PPO

## 2019-07-20 ENCOUNTER — Encounter: Payer: Self-pay | Admitting: Obstetrics and Gynecology

## 2019-07-20 ENCOUNTER — Emergency Department
Admission: EM | Admit: 2019-07-20 | Discharge: 2019-07-20 | Disposition: A | Discharge status: Home / Self Care | Payer: BC Managed Care – PPO | Attending: Emergency Medicine | Admitting: Emergency Medicine

## 2019-07-20 DIAGNOSIS — Z79899 Other long term (current) drug therapy: Secondary | ICD-10-CM | POA: Insufficient documentation

## 2019-07-20 DIAGNOSIS — R1011 Right upper quadrant pain: Secondary | ICD-10-CM | POA: Diagnosis present

## 2019-07-20 DIAGNOSIS — Z888 Allergy status to other drugs, medicaments and biological substances status: Secondary | ICD-10-CM | POA: Diagnosis not present

## 2019-07-20 DIAGNOSIS — J45909 Unspecified asthma, uncomplicated: Secondary | ICD-10-CM | POA: Diagnosis not present

## 2019-07-20 DIAGNOSIS — R109 Unspecified abdominal pain: Secondary | ICD-10-CM

## 2019-07-20 DIAGNOSIS — R7303 Prediabetes: Secondary | ICD-10-CM | POA: Diagnosis not present

## 2019-07-20 LAB — URINALYSIS, COMPLETE (UACMP) WITH MICROSCOPIC
Bacteria, UA: NONE SEEN
Bilirubin Urine: NEGATIVE
Glucose, UA: NEGATIVE mg/dL
Ketones, ur: NEGATIVE mg/dL
Nitrite: NEGATIVE
Protein, ur: NEGATIVE mg/dL
Specific Gravity, Urine: 1.025 (ref 1.005–1.030)
pH: 6 (ref 5.0–8.0)

## 2019-07-20 LAB — CBC
HCT: 35.5 % — ABNORMAL LOW (ref 36.0–46.0)
Hemoglobin: 11.3 g/dL — ABNORMAL LOW (ref 12.0–15.0)
MCH: 26.3 pg (ref 26.0–34.0)
MCHC: 31.8 g/dL (ref 30.0–36.0)
MCV: 82.8 fL (ref 80.0–100.0)
Platelets: 216 10*3/uL (ref 150–400)
RBC: 4.29 MIL/uL (ref 3.87–5.11)
RDW: 13 % (ref 11.5–15.5)
WBC: 6.4 10*3/uL (ref 4.0–10.5)
nRBC: 0 % (ref 0.0–0.2)

## 2019-07-20 LAB — COMPREHENSIVE METABOLIC PANEL
ALT: 11 U/L (ref 0–44)
AST: 16 U/L (ref 15–41)
Albumin: 4.3 g/dL (ref 3.5–5.0)
Alkaline Phosphatase: 45 U/L (ref 38–126)
Anion gap: 8 (ref 5–15)
BUN: 11 mg/dL (ref 6–20)
CO2: 23 mmol/L (ref 22–32)
Calcium: 9.3 mg/dL (ref 8.9–10.3)
Chloride: 106 mmol/L (ref 98–111)
Creatinine, Ser: 0.76 mg/dL (ref 0.44–1.00)
GFR calc Af Amer: >60 mL/min (ref >60)
GFR calc non Af Amer: >60 mL/min (ref >60)
Glucose, Bld: 110 mg/dL — ABNORMAL HIGH (ref 70–99)
Potassium: 3.7 mmol/L (ref 3.5–5.1)
Sodium: 137 mmol/L (ref 135–145)
Total Bilirubin: 0.2 mg/dL — ABNORMAL LOW (ref 0.3–1.2)
Total Protein: 7.3 g/dL (ref 6.5–8.1)

## 2019-07-20 LAB — LIPASE, BLOOD: Lipase: 27 U/L (ref 11–51)

## 2019-07-20 LAB — POCT PREGNANCY, URINE: Preg Test, Ur: NEGATIVE

## 2019-07-20 MED ORDER — SUCRALFATE 1 GM/10ML PO SUSP: 1.0000 g | Freq: Four times a day (QID) | ORAL | 1 refills | Status: DC

## 2019-07-20 MED ORDER — FAMOTIDINE 20 MG PO TABS: 20.0000 mg | ORAL_TABLET | Freq: Every day | ORAL | 1 refills | Status: DC

## 2019-07-20 NOTE — Discharge Instructions (Addendum)
Please follow closely with Dr. Alice Reichert and your primary care doctor., return to the emergency room for any new or worrisome symptoms associated with your chronic abdominal pain.

## 2019-07-20 NOTE — ED Triage Notes (Signed)
Pt c/o LUQ abd x 1  Year, states usually the pain is off and on, but states has been constant x 1 week. Pt reports pain is worse after eating and radiates to her back.  Pt reports nausea but no vomiting.  Son had stomach virus recently.

## 2019-07-20 NOTE — ED Provider Notes (Addendum)
[facility] Emergency Department Provider Note  ____________________________________________   I have reviewed the triage vital signs and the nursing notes. Where available I have reviewed prior notes and, if possible and indicated, outside hospital notes.   Patient seen and evaluated during the coronavirus epidemic during a time with low staffing  Patient seen for the symptoms described in the history of present illness. She was evaluated in the context of the global COVID-19 pandemic, which necessitated consideration that the patient might be at risk for infection with the SARS-CoV-2 virus that causes COVID-19. Institutional protocols and algorithms that pertain to the evaluation of patients at risk for COVID-19 are in a state of rapid change based on information released by regulatory bodies including the CDC and federal and state organizations. These policies and algorithms were followed during the patient's care in the ED.    HISTORY  Chief Complaint Abdominal Pain    HPI Jasmine Gordon is a 33 y.o. female  Who states she is had left-sided abdominal pain worse with food for the last year.  Sometimes better sometimes is worse.  She is seen GI for this had ultrasounds for this, and has not had any other symptoms no other alleviating or aggravating factors aside from food.  She states that she has not lost any weight.  She denies any fever chills nausea or vomiting she denies any dysuria urinary frequency or flank pain, the pain is usually worse with food, after she eats she has a discomfort in her left upper abdomen for a brief period and then it goes away.  She has been given Carafate and Prilosec for this in the past but is not taking them she states.  She denies any other alleviating or aggravating factors.  No chest pain or shortness of breath.  Patient has had extensive work-up for this in the past.  She states that she has normal bowel movements no diarrhea.   No dysuria no urinary frequency she denies pregnancy.   Pain is a "it feels like I am full" kind of pain she states he states today she had 2 sandwiches a candy bar of potato chips and a few other items and does not feel too much pain at this time   Past Medical History:  Diagnosis Date  . Allergy   . Anemia   . Asthma   . Migraine   . Panic attack   . PTSD (post-traumatic stress disorder) 2016    Patient Active Problem List   Diagnosis Date Noted  . Genital warts 09/11/2018  . Positive TB test 09/25/2017  . Allergic contact dermatitis due to metals 05/17/2017  . Pre-diabetes 03/06/2017  . Hyperlipidemia 03/06/2017  . Allergic reaction 02/08/2017  . Urticaria due to food allergy 02/08/2017  . Vitamin D deficiency 11/08/2016  . Iron deficiency anemia due to chronic blood loss 10/03/2016  . Menorrhagia with regular cycle 06/27/2016  . Mild persistent asthma 06/17/2016  . Anxiety and depression 06/17/2016  . Morbid obesity with BMI of 40.0-44.9, adult ([place]) 06/17/2016    Past Surgical History:  Procedure Laterality Date  . APPENDECTOMY    . Plantar warts      Prior to Admission medications   Medication Sig Start Date End Date Taking? Authorizing Provider  albuterol (PROVENTIL HFA;VENTOLIN HFA) 108 (90 Base) MCG/ACT inhaler Inhale 2 puffs into the lungs every 6 (six) hours as needed for wheezing or shortness of breath. 02/14/19   Karamalegos, Devonne Doughty, DO  albuterol (PROVENTIL) (2.5  MG/3ML) 0.083% nebulizer solution Take 3 mLs (2.5 mg total) by nebulization every 6 (six) hours as needed for wheezing or shortness of breath. 11/06/18   Karamalegos, Devonne Doughty, DO  Cholecalciferol (VITAMIN D-1000 MAX ST) 25 MCG (1000 UT) tablet Take by mouth.    [provider]  clotrimazole-betamethasone (LOTRISONE) cream Apply externally BID prn sx up to 2 wks 07/26/17   Copland, Alicia B, PA-C  dicyclomine (BENTYL) 10 MG capsule Take 1 capsule (10 mg total) by mouth 4 (four) times  daily -  before meals and at bedtime. As needed for abdominal cramping bloating 07/17/19   Parks Ranger, Devonne Doughty, DO  fluticasone [facility]) 50 MCG/ACT nasal spray Place 1 spray into both nostrils daily. 08/20/18 08/20/19  Lannie Fields, PA-C  ibuprofen (ADVIL,MOTRIN) 600 MG tablet Take 1 tablet (600 mg total) by mouth every 8 (eight) hours as needed. 12/10/18   Darel Hong, MD  imiquimod [place]) 5 % cream Apply topically 3 (three) times a week. Apply until total clearance or maximum of 12 weeks 06/25/15   Copland, Deirdre Evener, PA-C  Respiratory Therapy Supplies (NEBULIZER/ADULT MASK) KIT Use nebulizer mask and supplies kit for nebulizer machine as needed 03/18/19   [name], DO    Allergies Reglan [metoclopramide]  Family History  Problem Relation Age of Onset  . Depression Mother   . Mental retardation Mother   . Bipolar disorder Mother   . Diabetes Father   . Stroke Maternal Grandfather   . Diabetes Maternal Grandfather     Social History Social History   Tobacco Use  . Smoking status: Never Smoker  . Smokeless tobacco: Never Used  Substance Use Topics  . Alcohol use: No  . Drug use: No    Review of Systems Constitutional: No fever/chills Eyes: No visual changes. ENT: No sore throat. No stiff neck no neck pain Cardiovascular: Denies chest pain. Respiratory: Denies shortness of breath. Gastrointestinal:   no vomiting.  No diarrhea.  No constipation. Genitourinary: Negative for dysuria. Musculoskeletal: Negative lower extremity swelling Skin: Negative for rash. Neurological: Negative for severe headaches, focal weakness or numbness.   ____________________________________________   PHYSICAL EXAM:  VITAL SIGNS: ED Triage Vitals  Enc Vitals Group     BP 07/20/19 1723 (!) 144/83     Pulse Rate 07/20/19 1723 (!) 112     Resp 07/20/19 1723 18     Temp 07/20/19 1723 99.4 F (37.4 C)     Temp Source 07/20/19 1723 Oral     SpO2 07/20/19 1723 100 %      Weight 07/20/19 1723 265 lb (120.2 kg)     Height 07/20/19 1723 '5\' 7"'  (1.702 m)     Head Circumference --      Peak Flow --      Pain Score 07/20/19 1733 9     Pain Loc --      Pain Edu? --      Excl. in [place]? --     Constitutional: Alert and oriented. Well appearing and in no acute distress. Eyes: Conjunctivae are normal Head: Atraumatic HEENT: No congestion/rhinnorhea. Mucous membranes are moist.  Oropharynx non-erythematous Neck:   Nontender with no meningismus, no masses, no stridor Cardiovascular: Normal rate, regular rhythm. Grossly normal heart sounds.  Good peripheral circulation. Respiratory: Normal respiratory effort.  No retractions. Lungs CTAB. Abdominal: Soft and nontender. No distention. No guarding no rebound Back:  There is no focal tenderness or step off.  there is no midline tenderness there are no  lesions noted. there is no CVA tenderness Musculoskeletal: No lower extremity tenderness, no upper extremity tenderness. No joint effusions, no DVT signs strong distal pulses no edema Neurologic:  Normal speech and language. No gross focal neurologic deficits are appreciated.  Skin:  Skin is warm, dry and intact. No rash noted. Psychiatric: Mood and affect are normal. Speech and behavior are normal.  ____________________________________________   LABS (all labs ordered are listed, but only abnormal results are displayed)  Labs Reviewed  COMPREHENSIVE METABOLIC PANEL - Abnormal; Notable for the following components:      Result Value   Glucose, Bld 110 (*)    Total Bilirubin 0.2 (*)    All other components within normal limits  URINALYSIS, COMPLETE (UACMP) WITH MICROSCOPIC - Abnormal; Notable for the following components:   Color, Urine YELLOW (*)    APPearance HAZY (*)    Hgb urine dipstick SMALL (*)    Leukocytes,Ua SMALL (*)    All other components within normal limits  CBC - Abnormal; Notable for the following components:   Hemoglobin 11.3 (*)    HCT 35.5 (*)     All other components within normal limits  LIPASE, BLOOD  POC URINE PREG, ED  POCT PREGNANCY, URINE    Pertinent labs  results that were available during my care of the patient were reviewed by me and considered in my medical decision making (see chart for details). ____________________________________________  EKG  I personally interpreted any EKGs ordered by me or triage  ____________________________________________  RADIOLOGY  Pertinent labs & imaging results that were available during my care of the patient were reviewed by me and considered in my medical decision making (see chart for details). If possible, patient and/or family made aware of any abnormal findings.  No results found. ____________________________________________    PROCEDURES  Procedure(s) performed: None  Procedures  Critical Care performed: None  ____________________________________________   INITIAL IMPRESSION / ASSESSMENT AND PLAN / ED COURSE  Pertinent labs & imaging results that were available during my care of the patient were reviewed by me and considered in my medical decision making (see chart for details).   Patient with 1 year worth of food related abdominal discomfort, abdomen is completely benign at this time I do not see any indication for acute imaging blood work is normal and vital signs are reassuring.  She is somewhat anxious about this heart rate was initially slightly elevated but as she talks to me she calms down heart rate is now 85.  She has no evidence of significant discomfort.  Medical screening exam for emergent condition does not indicate any clear evidence of a pathology likely to decompensate prior to outpatient follow-up I do believe the patient would do better with a follow-up with GI rather than more testing in the emergency department for pain that is been there for over a year.  I will advised to restart the Carafate and I will re-prescribe the Carafate and the  Prilosec.  We will have her follow again with Dr. Alice Reichert she will call his office first thing Monday.  Extensive return precautions and follow-up given understood.  Patient has had her appendix out she has no right upper quadrant abdominal pain, her liver function tests are normal, gallbladder disease is possible.    ----------------------------------------- 11:25 PM on 07/20/2019 -----------------------------------------  Pt in nad abd benign pain x 12 months [place] normal labs reassuring to suggest PID appendicitis is not likely given that she has had appendicectomy, patient with no evidence  of right upper quadrant discomfort, which she has is pain around her epigastric/stomach region with food, nothing to suggest acute pancreatitis but patient will need outpatient follow-up and further work-up for this chronic pain complaint.  I have told her that while we have try to rule out things that we will get her emergently in trouble, the chronic issue is going to require further work-up both from her primary care doctor and GI .   Extensive return precautions given and understood nothing at this time to suggest diverticulitis I cannot reproduce any of her discomfort.  However, she understands that if she feels worse she is to come back.  I did talk to her about CT scan, however, at this time she would prefer to defer because of the late hour.  She understands that if she changes her mind or feel worse she can come back anytime       ____________________________________________   FINAL CLINICAL IMPRESSION(S) / ED DIAGNOSES  Final diagnoses:  None      This chart was dictated using voice recognition software.  Despite best efforts to proofread,  errors can occur which can change meaning.      Schuyler Amor, MD 07/20/19 2131    Schuyler Amor, MD 07/20/19 2328

## 2019-07-24 ENCOUNTER — Inpatient Hospital Stay: Payer: BC Managed Care – PPO

## 2019-07-26 ENCOUNTER — Inpatient Hospital Stay: Payer: BC Managed Care – PPO

## 2019-07-26 ENCOUNTER — Other Ambulatory Visit: Payer: Self-pay

## 2019-07-26 ENCOUNTER — Inpatient Hospital Stay: Payer: BC Managed Care – PPO | Admitting: Oncology

## 2019-07-26 ENCOUNTER — Inpatient Hospital Stay: Payer: BC Managed Care – PPO | Attending: Oncology

## 2019-07-26 DIAGNOSIS — Z79899 Other long term (current) drug therapy: Secondary | ICD-10-CM | POA: Diagnosis not present

## 2019-07-26 DIAGNOSIS — R000 Tachycardia, unspecified: Secondary | ICD-10-CM | POA: Diagnosis not present

## 2019-07-26 DIAGNOSIS — D500 Iron deficiency anemia secondary to blood loss (chronic): Secondary | ICD-10-CM | POA: Insufficient documentation

## 2019-07-26 LAB — CBC WITH DIFFERENTIAL/PLATELET
Abs Immature Granulocytes: 0.01 10*3/uL (ref 0.00–0.07)
Basophils Absolute: 0 10*3/uL (ref 0.0–0.1)
Basophils Relative: 0 %
Eosinophils Absolute: 0.1 10*3/uL (ref 0.0–0.5)
Eosinophils Relative: 2 %
HCT: 37.4 % (ref 36.0–46.0)
Hemoglobin: 11.7 g/dL — ABNORMAL LOW (ref 12.0–15.0)
Immature Granulocytes: 0 %
Lymphocytes Relative: 37 %
Lymphs Abs: 1.9 10*3/uL (ref 0.7–4.0)
MCH: 26.1 pg (ref 26.0–34.0)
MCHC: 31.3 g/dL (ref 30.0–36.0)
MCV: 83.3 fL (ref 80.0–100.0)
Monocytes Absolute: 0.3 10*3/uL (ref 0.1–1.0)
Monocytes Relative: 6 %
Neutro Abs: 2.8 10*3/uL (ref 1.7–7.7)
Neutrophils Relative %: 55 %
Platelets: 231 10*3/uL (ref 150–400)
RBC: 4.49 MIL/uL (ref 3.87–5.11)
RDW: 12.8 % (ref 11.5–15.5)
WBC: 5.1 10*3/uL (ref 4.0–10.5)
nRBC: 0 % (ref 0.0–0.2)

## 2019-07-26 LAB — FERRITIN: Ferritin: 43 ng/mL (ref 11–307)

## 2019-07-26 LAB — IRON AND TIBC
Iron: 73 ug/dL (ref 28–170)
Saturation Ratios: 24 % (ref 10.4–31.8)
TIBC: 311 ug/dL (ref 250–450)
UIBC: 238 ug/dL

## 2019-07-26 NOTE — Telephone Encounter (Signed)
Patient is schedule Tuesday, 07/30/19 with ABC

## 2019-07-30 ENCOUNTER — Ambulatory Visit: Payer: Medicaid Other | Admitting: Obstetrics and Gynecology

## 2019-07-30 NOTE — Progress Notes (Deleted)
Jasmine Hauser, DO   No chief complaint on file.   HPI:      [name] is a 33 y.o. [phone] who LMP was Patient's last menstrual period was 07/03/2019., presents today for f/u ext genital wart tx.  Warts treated with TCA 08/22/18 and 09/11/18. Pt doesn't feel like there was any change in lesions after last tx. Lesions in perineal and perianal area, as well as post fourchette.  Acute vaginitis sx improved after lotrisone crm 10/19.  Past Medical History:  Diagnosis Date  . Allergy   . Anemia   . Asthma   . Migraine   . Panic attack   . PTSD (post-traumatic stress disorder) 2016    Past Surgical History:  Procedure Laterality Date  . APPENDECTOMY    . Plantar warts      Family History  Problem Relation Age of Onset  . Depression Mother   . Mental retardation Mother   . Bipolar disorder Mother   . Diabetes Father   . Stroke Maternal Grandfather   . Diabetes Maternal Grandfather     Social History   Socioeconomic History  . Marital status: Married    Spouse name: Not on file  . Number of children: Not on file  . Years of education: Not on file  . Highest education level: Not on file  Occupational History  . Not on file  Social Needs  . Financial resource strain: Not on file  . Food insecurity    Worry: Not on file    Inability: Not on file  . Transportation needs    Medical: Not on file    Non-medical: Not on file  Tobacco Use  . Smoking status: Never Smoker  . Smokeless tobacco: Never Used  Substance and Sexual Activity  . Alcohol use: No  . Drug use: No  . Sexual activity: Yes    Birth control/protection: Condom  Lifestyle  . Physical activity    Days per week: Not on file    Minutes per session: Not on file  . Stress: Not on file  Relationships  . Social Herbalist on phone: Not on file    Gets together: Not on file    Attends religious service: Not on file    Active member of club or organization: Not on  file    Attends meetings of clubs or organizations: Not on file    Relationship status: Not on file  . Intimate partner violence    Fear of current or ex partner: Not on file    Emotionally abused: Not on file    Physically abused: Not on file    Forced sexual activity: Not on file  Other Topics Concern  . Not on file  Social History Narrative  . Not on file    Outpatient Medications Prior to Visit  Medication Sig Dispense Refill  . albuterol (PROVENTIL HFA;VENTOLIN HFA) 108 (90 Base) MCG/ACT inhaler Inhale 2 puffs into the lungs every 6 (six) hours as needed for wheezing or shortness of breath. 1 Inhaler 2  . albuterol (PROVENTIL) (2.5 MG/3ML) 0.083% nebulizer solution Take 3 mLs (2.5 mg total) by nebulization every 6 (six) hours as needed for wheezing or shortness of breath. 150 mL 1  . Cholecalciferol (VITAMIN D-1000 MAX ST) 25 MCG (1000 UT) tablet Take by mouth.    . clotrimazole-betamethasone (LOTRISONE) cream Apply externally BID prn sx up to 2 wks 15 g 0  . dicyclomine (  BENTYL) 10 MG capsule Take 1 capsule (10 mg total) by mouth 4 (four) times daily -  before meals and at bedtime. As needed for abdominal cramping bloating 30 capsule 2  . famotidine (PEPCID) 20 MG tablet Take 1 tablet (20 mg total) by mouth daily. 30 tablet 1  . fluticasone (FLONASE) 50 MCG/ACT nasal spray Place 1 spray into both nostrils daily. 16 g 2  . ibuprofen (ADVIL,MOTRIN) 600 MG tablet Take 1 tablet (600 mg total) by mouth every 8 (eight) hours as needed. 30 tablet 0  . imiquimod (ALDARA) 5 % cream Apply topically 3 (three) times a week. Apply until total clearance or maximum of 12 weeks 12 each 2  . Respiratory Therapy Supplies (NEBULIZER/ADULT MASK) KIT Use nebulizer mask and supplies kit for nebulizer machine as needed 1 each 0  . sucralfate (CARAFATE) 1 GM/10ML suspension Take 10 mLs (1 g total) by mouth 4 (four) times daily. 420 mL 1   No facility-administered medications prior to visit.       ROS:   Review of Systems  Constitutional: Negative for fever.  Gastrointestinal: Negative for blood in stool, constipation, diarrhea, nausea and vomiting.  Genitourinary: Positive for genital sores. Negative for dyspareunia, dysuria, flank pain, frequency, hematuria, urgency, vaginal bleeding, vaginal discharge and vaginal pain.  Musculoskeletal: Negative for back pain.  Skin: Negative for rash.   OBJECTIVE:   Vitals:  LMP 07/03/2019   Physical Exam Vitals signs reviewed.  Constitutional:      Appearance: She is well-developed.  Neck:     Musculoskeletal: Normal range of motion.  Pulmonary:     Effort: Pulmonary effort is normal.  Genitourinary:      Comments: EXT HPV LESIONS; TREATED WITH TCA Musculoskeletal: Normal range of motion.  Neurological:     Mental Status: She is alert and oriented to person, place, and time.     Cranial Nerves: No cranial nerve deficit.  Psychiatric:        Behavior: Behavior normal.        Thought Content: Thought content normal.        Judgment: Judgment normal.     Assessment/Plan: No diagnosis found.    No orders of the defined types were placed in this encounter.     No follow-ups on file.  Shalen Petrak B. Saraiya Kozma, PA-C 07/30/2019 8:48 AM

## 2019-08-01 ENCOUNTER — Encounter: Payer: Self-pay | Admitting: Oncology

## 2019-08-01 NOTE — Progress Notes (Signed)
Patient is coming in for follow up she is doing well no complaints  

## 2019-08-02 ENCOUNTER — Inpatient Hospital Stay: Payer: BC Managed Care – PPO

## 2019-08-02 ENCOUNTER — Other Ambulatory Visit: Payer: Self-pay

## 2019-08-02 ENCOUNTER — Encounter: Payer: Self-pay | Admitting: Oncology

## 2019-08-02 ENCOUNTER — Inpatient Hospital Stay (HOSPITAL_BASED_OUTPATIENT_CLINIC_OR_DEPARTMENT_OTHER): Payer: BC Managed Care – PPO | Admitting: Oncology

## 2019-08-02 VITALS — BP 145/93 | HR 145 | Temp 99.1°F | Resp 18 | Wt 268.3 lb

## 2019-08-02 DIAGNOSIS — R000 Tachycardia, unspecified: Secondary | ICD-10-CM

## 2019-08-02 DIAGNOSIS — D500 Iron deficiency anemia secondary to blood loss (chronic): Secondary | ICD-10-CM

## 2019-08-02 NOTE — Progress Notes (Signed)
Patient's bp and hr are elevated today at 145/93 hr 145.  She reports that she has been rushing today and just used her albuterol rescue inhaler.

## 2019-08-03 ENCOUNTER — Encounter: Payer: Self-pay | Admitting: Emergency Medicine

## 2019-08-03 ENCOUNTER — Emergency Department: Payer: BC Managed Care – PPO

## 2019-08-03 ENCOUNTER — Other Ambulatory Visit: Payer: Self-pay

## 2019-08-03 ENCOUNTER — Emergency Department
Admission: EM | Admit: 2019-08-03 | Discharge: 2019-08-03 | Disposition: A | Discharge status: Home / Self Care | Payer: BC Managed Care – PPO | Attending: Emergency Medicine | Admitting: Emergency Medicine

## 2019-08-03 DIAGNOSIS — Z79899 Other long term (current) drug therapy: Secondary | ICD-10-CM | POA: Insufficient documentation

## 2019-08-03 DIAGNOSIS — R030 Elevated blood-pressure reading, without diagnosis of hypertension: Secondary | ICD-10-CM | POA: Diagnosis not present

## 2019-08-03 DIAGNOSIS — J45909 Unspecified asthma, uncomplicated: Secondary | ICD-10-CM | POA: Insufficient documentation

## 2019-08-03 DIAGNOSIS — R000 Tachycardia, unspecified: Secondary | ICD-10-CM

## 2019-08-03 LAB — CBC
HCT: 37.1 % (ref 36.0–46.0)
Hemoglobin: 11.8 g/dL — ABNORMAL LOW (ref 12.0–15.0)
MCH: 26.5 pg (ref 26.0–34.0)
MCHC: 31.8 g/dL (ref 30.0–36.0)
MCV: 83.2 fL (ref 80.0–100.0)
Platelets: 222 10*3/uL (ref 150–400)
RBC: 4.46 MIL/uL (ref 3.87–5.11)
RDW: 13 % (ref 11.5–15.5)
WBC: 4.8 10*3/uL (ref 4.0–10.5)
nRBC: 0 % (ref 0.0–0.2)

## 2019-08-03 LAB — BASIC METABOLIC PANEL
Anion gap: 10 (ref 5–15)
BUN: 10 mg/dL (ref 6–20)
CO2: 23 mmol/L (ref 22–32)
Calcium: 9.1 mg/dL (ref 8.9–10.3)
Chloride: 107 mmol/L (ref 98–111)
Creatinine, Ser: 0.81 mg/dL (ref 0.44–1.00)
GFR calc Af Amer: >60 mL/min (ref >60)
GFR calc non Af Amer: >60 mL/min (ref >60)
Glucose, Bld: 110 mg/dL — ABNORMAL HIGH (ref 70–99)
Potassium: 3.5 mmol/L (ref 3.5–5.1)
Sodium: 140 mmol/L (ref 135–145)

## 2019-08-03 LAB — TROPONIN I (HIGH SENSITIVITY): Troponin I (High Sensitivity): <2 ng/L (ref <18)

## 2019-08-03 LAB — TSH: TSH: 4.748 u[IU]/mL — ABNORMAL HIGH (ref 0.350–4.500)

## 2019-08-03 NOTE — ED Provider Notes (Signed)
[facility] Emergency Department Provider Note   ____________________________________________    I have reviewed the triage vital signs and the nursing notes.   HISTORY  Chief Complaint Elevated heart rate and blood pressure   HPI Jasmine Gordon is a 33 y.o. female who presents with complaints of elevated blood pressure and elevated heart rate which she first noticed yesterday.  Patient reports she went to the cancer center for follow-up appointment for iron deficiency, had been in quite a hurry all day and noticed that her blood pressure was elevated and her heart rate was elevated as well.  She denies chest pain.  No shortness of breath reported.  No pleurisy.  No calf pain or swelling.  No nausea or vomiting.  No fevers or chills.  Past Medical History:  Diagnosis Date  . Allergy   . Anemia   . Asthma   . Migraine   . Panic attack   . PTSD (post-traumatic stress disorder) 2016    Patient Active Problem List   Diagnosis Date Noted  . Genital warts 09/11/2018  . Positive TB test 09/25/2017  . Allergic contact dermatitis due to metals 05/17/2017  . Pre-diabetes 03/06/2017  . Hyperlipidemia 03/06/2017  . Allergic reaction 02/08/2017  . Urticaria due to food allergy 02/08/2017  . Vitamin D deficiency 11/08/2016  . Iron deficiency anemia due to chronic blood loss 10/03/2016  . Menorrhagia with regular cycle 06/27/2016  . Mild persistent asthma 06/17/2016  . Anxiety and depression 06/17/2016  . Morbid obesity with BMI of 40.0-44.9, adult ([place]) 06/17/2016    Past Surgical History:  Procedure Laterality Date  . APPENDECTOMY    . Plantar warts      Prior to Admission medications   Medication Sig Start Date End Date Taking? Authorizing Provider  albuterol (PROVENTIL HFA;VENTOLIN HFA) 108 (90 Base) MCG/ACT inhaler Inhale 2 puffs into the lungs every 6 (six) hours as needed for wheezing or shortness of breath. 02/14/19   Karamalegos,  Devonne Doughty, DO  albuterol (PROVENTIL) (2.5 MG/3ML) 0.083% nebulizer solution Take 3 mLs (2.5 mg total) by nebulization every 6 (six) hours as needed for wheezing or shortness of breath. 11/06/18   Karamalegos, Devonne Doughty, DO  Cholecalciferol (VITAMIN D-1000 MAX ST) 25 MCG (1000 UT) tablet Take by mouth.    [provider]  clotrimazole-betamethasone (LOTRISONE) cream Apply externally BID prn sx up to 2 wks 0/9/32   Copland, Alicia B, PA-C  dicyclomine (BENTYL) 10 MG capsule Take 1 capsule (10 mg total) by mouth 4 (four) times daily -  before meals and at bedtime. As needed for abdominal cramping bloating 07/17/19   Parks Ranger, Devonne Doughty, DO  famotidine (PEPCID) 20 MG tablet Take 1 tablet (20 mg total) by mouth daily. 07/20/19 07/19/20  Schuyler Amor, MD  fluticasone (FLONASE) 50 MCG/ACT nasal spray Place 1 spray into both nostrils daily. 08/20/18 08/20/19  Lannie Fields, PA-C  ibuprofen (ADVIL,MOTRIN) 600 MG tablet Take 1 tablet (600 mg total) by mouth every 8 (eight) hours as needed. 12/10/18   Darel Hong, MD  imiquimod [place]) 5 % cream Apply topically 3 (three) times a week. Apply until total clearance or maximum of 12 weeks 01/24/56   Copland, Deirdre Evener, PA-C  Respiratory Therapy Supplies (NEBULIZER/ADULT MASK) KIT Use nebulizer mask and supplies kit for nebulizer machine as needed 03/18/19   Karamalegos, Alexander J, DO  sucralfate (CARAFATE) 1 GM/10ML suspension Take 10 mLs (1 g total) by mouth 4 (four) times daily.  07/20/19 07/19/20  Schuyler Amor, MD     Allergies Reglan [metoclopramide]  Family History  Problem Relation Age of Onset  . Depression Mother   . Mental retardation Mother   . Bipolar disorder Mother   . Diabetes Father   . Stroke Maternal Grandfather   . Diabetes Maternal Grandfather     Social History Social History   Tobacco Use  . Smoking status: Never Smoker  . Smokeless tobacco: Never Used  Substance Use Topics  . Alcohol use: No  . Drug use:  No    Review of Systems  Constitutional: No fever/chills Eyes: No visual changes.  ENT: No sore throat. Cardiovascular: As above Respiratory: As above Gastrointestinal: No abdominal pain.  No nausea, no vomiting.   Genitourinary: Negative for dysuria. Musculoskeletal: Negative for back pain. Skin: Negative for rash. Neurological: Negative for headaches or weakness   ____________________________________________   PHYSICAL EXAM:  VITAL SIGNS: ED Triage Vitals  Enc Vitals Group     BP 08/03/19 0646 (!) 144/79     Pulse Rate 08/03/19 0646 (!) 125     Resp 08/03/19 0646 18     Temp 08/03/19 0646 99.1 F (37.3 C)     Temp Source 08/03/19 0646 Oral     SpO2 08/03/19 0646 100 %     Weight 08/03/19 0649 120.2 kg (265 lb)     Height 08/03/19 0649 1.676 m ('5\' 6"'$ )     Head Circumference --      Peak Flow --      Pain Score 08/03/19 0649 6     Pain Loc --      Pain Edu? --      Excl. in [place]? --     Constitutional: Alert and oriented.  Eyes: Conjunctivae are normal.   Nose: No congestion/rhinnorhea. Mouth/Throat: Mucous membranes are moist.    Cardiovascular: Tachycardia, regular rhythm. Grossly normal heart sounds.  Good peripheral circulation. Respiratory: Normal respiratory effort.  No retractions. Lungs CTAB. Gastrointestinal: Soft and nontender. No distention.    Musculoskeletal: No lower extremity tenderness nor edema.  Warm and well perfused Neurologic:  Normal speech and language. No gross focal neurologic deficits are appreciated.  Skin:  Skin is warm, dry and intact. No rash noted. Psychiatric: Mood and affect are normal. Speech and behavior are normal.  ____________________________________________   LABS (all labs ordered are listed, but only abnormal results are displayed)  Labs Reviewed  BASIC METABOLIC PANEL - Abnormal; Notable for the following components:      Result Value   Glucose, Bld 110 (*)    All other components within normal limits  CBC -  Abnormal; Notable for the following components:   Hemoglobin 11.8 (*)    All other components within normal limits  TSH - Abnormal; Notable for the following components:   TSH 4.748 (*)    All other components within normal limits  POC URINE PREG, ED  TROPONIN I (HIGH SENSITIVITY)   ____________________________________________  EKG  ED ECG REPORT I, Lavonia Drafts, the attending physician, personally viewed and interpreted this ECG.  Date: 08/03/2019  Rhythm: normal sinus rhythm QRS Axis: normal Intervals: normal ST/T Wave abnormalities: normal Narrative Interpretation: no evidence of acute ischemia  ____________________________________________  RADIOLOGY  Chest x-ray unremarkable ____________________________________________   PROCEDURES  Procedure(s) performed: No  Procedures   Critical Care performed: No ____________________________________________   INITIAL IMPRESSION / ASSESSMENT AND PLAN / ED COURSE  Pertinent labs & imaging results that were available during my care  of the patient were reviewed by me and considered in my medical decision making (see chart for details).  Patient presents with complaints of elevated heart rate, elevated blood pressure.  Blood pressure here is generally reassuring, mildly elevated diastolic blood pressure.  She is currently asymptomatic and well-appearing.  Initial heart rate was elevated however after sitting in room her heart rate normalized.  Lab work is unremarkable, EKG is reassuring.  No pleurisy no shortness of breath, no calf pain.no [id number], PERC -  Patient's heart rate normalized, TSH is mildly elevated suggesting hypothyroidism, she will follow-up with her PCP, no indication for admission at this time.  She feels well and is content with discharge and outpatient follow-up      ____________________________________________   FINAL CLINICAL IMPRESSION(S) / ED DIAGNOSES  Final diagnoses:  Tachycardia         Note:  This document was prepared using Dragon voice recognition software and may include unintentional dictation errors.   Lavonia Drafts, MD 08/03/19 1300

## 2019-08-03 NOTE — Progress Notes (Signed)
[facility] day:  08/03/2019  Chief Complaint: Jasmine Gordon is a 32 y.o. female with iron deficiency anemia who is seen for 15 month reassessment and re-initiation of IV iron.  PERTINENT HEMATOLOGY HISTORY She followed up with Dr.Corcoran. switched care to me on 01/29/2019  iron deficiency anemia likely due to heavy menses.  Menses improved after IUD placement.  Diet appears good.  She denies any melena, hematochezia, or hematuria.  Urinalysis reveals no blood.  She received Venofer weekly x 3 (04/27/2017 - 05/10/2017).  CBC on 09/06/2017 revealed a hematocrit of 35.8, hemoglobin 11.9 and MCV 81.6. Chest CT angiogram on 07/26/2018 revealed no evidence of pulmonary embolism.  There was no evidence of active pulmonary disease.  INTERVAL HISTORY Jasmine Gordon is a 33 y.o. female who has above history reviewed by me today presents for follow up visit for management of iron deficiency anemia.  Patient reports feeling well. She was seen a rush coming to her appointment today.  She ran a few flights of stairs and from parking lot.  She also used albuterol inhaler.  Her heart rate was 140s in the clinic.  Mild SOB which she attributes to exertion.   Her menstrual period has been light since IUD has been removed.  No new complaints.   Review of Systems  Constitutional: Negative for appetite change, chills, fatigue and fever.  HENT:   Negative for hearing loss and voice change.   Eyes: Negative for eye problems.  Respiratory: Negative for chest tightness and cough.   Cardiovascular: Negative for chest pain.  Gastrointestinal: Negative for abdominal distention, abdominal pain and blood in stool.  Endocrine: Negative for hot flashes.  Genitourinary: Negative for difficulty urinating and frequency.   Musculoskeletal: Negative for arthralgias.  Skin: Negative for itching and rash.  Neurological: Negative for extremity weakness.   Hematological: Negative for adenopathy.  Psychiatric/Behavioral: Negative for confusion.     Past Medical History:  Diagnosis Date  . Allergy   . Anemia   . Asthma   . Migraine   . Panic attack   . PTSD (post-traumatic stress disorder) 2016    Past Surgical History:  Procedure Laterality Date  . APPENDECTOMY    . Plantar warts      Family History  Problem Relation Age of Onset  . Depression Mother   . Mental retardation Mother   . Bipolar disorder Mother   . Diabetes Father   . Stroke Maternal Grandfather   . Diabetes Maternal Grandfather    Social History   Socioeconomic History  . Marital status: Married    Spouse name: Not on file  . Number of children: Not on file  . Years of education: Not on file  . Highest education level: Not on file  Occupational History  . Not on file  Social Needs  . Financial resource strain: Not on file  . Food insecurity    Worry: Not on file    Inability: Not on file  . Transportation needs    Medical: Not on file    Non-medical: Not on file  Tobacco Use  . Smoking status: Never Smoker  . Smokeless tobacco: Never Used  Substance and Sexual Activity  . Alcohol use: No  . Drug use: No  . Sexual activity: Yes    Birth control/protection: Condom  Lifestyle  . Physical activity    Days per week: Not on file    Minutes per session: Not on file  .  Stress: Not on file  Relationships  . Social Herbalist on phone: Not on file    Gets together: Not on file    Attends religious service: Not on file    Active member of club or organization: Not on file    Attends meetings of clubs or organizations: Not on file    Relationship status: Not on file  . Intimate partner violence    Fear of current or ex partner: Not on file    Emotionally abused: Not on file    Physically abused: Not on file    Forced sexual activity: Not on file  Other Topics Concern  . Not on file  Social History Narrative  . Not on file  She  is working online to obtain a business administration degree.  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  She works 10 hours/day.  She is off 3 days/week.  She has 2 children with ADHD. She is going to the gym 5 days/week.  The patient is alone today.  Allergies:  Allergies  Allergen Reactions  . Reglan [Metoclopramide] Hives and Shortness Of Breath    Current Medications: Current Outpatient Medications  Medication Sig Dispense Refill  . albuterol (PROVENTIL HFA;VENTOLIN HFA) 108 (90 Base) MCG/ACT inhaler Inhale 2 puffs into the lungs every 6 (six) hours as needed for wheezing or shortness of breath. 1 Inhaler 2  . albuterol (PROVENTIL) (2.5 MG/3ML) 0.083% nebulizer solution Take 3 mLs (2.5 mg total) by nebulization every 6 (six) hours as needed for wheezing or shortness of breath. 150 mL 1  . Cholecalciferol (VITAMIN D-1000 MAX ST) 25 MCG (1000 UT) tablet Take by mouth.    . clotrimazole-betamethasone (LOTRISONE) cream Apply externally BID prn sx up to 2 wks 15 g 0  . dicyclomine (BENTYL) 10 MG capsule Take 1 capsule (10 mg total) by mouth 4 (four) times daily -  before meals and at bedtime. As needed for abdominal cramping bloating 30 capsule 2  . famotidine (PEPCID) 20 MG tablet Take 1 tablet (20 mg total) by mouth daily. 30 tablet 1  . fluticasone (FLONASE) 50 MCG/ACT nasal spray Place 1 spray into both nostrils daily. 16 g 2  . ibuprofen (ADVIL,MOTRIN) 600 MG tablet Take 1 tablet (600 mg total) by mouth every 8 (eight) hours as needed. 30 tablet 0  . imiquimod (ALDARA) 5 % cream Apply topically 3 (three) times a week. Apply until total clearance or maximum of 12 weeks 12 each 2  . Respiratory Therapy Supplies (NEBULIZER/ADULT MASK) KIT Use nebulizer mask and supplies kit for nebulizer machine as needed 1 each 0  . sucralfate (CARAFATE) 1 GM/10ML suspension Take 10 mLs (1 g total) by mouth 4 (four) times daily. 420 mL 1   No current facility-administered medications for this  visit.       Physical Exam: ECOG 0 Blood pressure (!) 145/93, pulse (!) 145, temperature 99.1 F (37.3 C), resp. rate 18, weight 268 lb 4.8 oz (121.7 kg), last menstrual period 07/29/2019.  Physical Exam  Constitutional: She is oriented to person, place, and time. No distress.  Obese  HENT:  Head: Normocephalic and atraumatic.  Mouth/Throat: No oropharyngeal exudate.  Eyes: Pupils are equal, round, and reactive to light. EOM are normal. No scleral icterus.  Neck: Normal range of motion. Neck supple.  Cardiovascular: Normal rate and regular rhythm.  No murmur heard. Pulmonary/Chest: Effort normal. No respiratory distress. She has no rales. She exhibits no tenderness.  Abdominal: Soft. She exhibits no distension. There is no abdominal tenderness.  Musculoskeletal: Normal range of motion.        General: No edema.  Neurological: She is alert and oriented to person, place, and time. No cranial nerve deficit. She exhibits normal muscle tone. Coordination normal.  Skin: Skin is warm and dry. She is not diaphoretic. No erythema.  Psychiatric: Affect normal.     No visits with results within 3 Day(s) from this visit.  Latest known visit with results is:  Appointment on 07/26/2019  Component Date Value Ref Range Status  . Iron 07/26/2019 73  28 - 170 ug/dL Final  . TIBC 07/26/2019 311  250 - 450 ug/dL Final  . Saturation Ratios 07/26/2019 24  10.4 - 31.8 % Final  . UIBC 07/26/2019 238  ug/dL Final   Performed at [facility], [address] [place], [place] [address]  . WBC 07/26/2019 5.1  4.0 - 10.5 K/uL Final  . RBC 07/26/2019 4.49  3.87 - 5.11 MIL/uL Final  . Hemoglobin 07/26/2019 11.7* 12.0 - 15.0 g/dL Final  . HCT 07/26/2019 37.4  36.0 - 46.0 % Final  . MCV 07/26/2019 83.3  80.0 - 100.0 fL Final  . MCH 07/26/2019 26.1  26.0 - 34.0 pg Final  . MCHC 07/26/2019 31.3  30.0 - 36.0 g/dL Final  . RDW 07/26/2019 12.8  11.5 - 15.5 % Final  . Platelets 07/26/2019 231  150 -  400 K/uL Final  . nRBC 07/26/2019 0.0  0.0 - 0.2 % Final  . Neutrophils Relative % 07/26/2019 55  % Final  . Neutro Abs 07/26/2019 2.8  1.7 - 7.7 K/uL Final  . Lymphocytes Relative 07/26/2019 37  % Final  . Lymphs Abs 07/26/2019 1.9  0.7 - 4.0 K/uL Final  . Monocytes Relative 07/26/2019 6  % Final  . Monocytes Absolute 07/26/2019 0.3  0.1 - 1.0 K/uL Final  . Eosinophils Relative 07/26/2019 2  % Final  . Eosinophils Absolute 07/26/2019 0.1  0.0 - 0.5 K/uL Final  . Basophils Relative 07/26/2019 0  % Final  . Basophils Absolute 07/26/2019 0.0  0.0 - 0.1 K/uL Final  . Immature Granulocytes 07/26/2019 0  % Final  . Abs Immature Granulocytes 07/26/2019 0.01  0.00 - 0.07 K/uL Final   Performed at [facility], [address], [place], [place] [address]  . Ferritin 07/26/2019 43  11 - 307 ng/mL Final   Performed at [facility], [address] [place], [place] [address]    Assessment:  Jasmine Gordon is a 33 y.o. female with  1. Iron deficiency anemia due to chronic blood loss   2. Tachycardia   Labs are reviewed and discussed with patient. Iron panel states stable. For additional management. Patient cannot tolerate oral iron supplementation. Repeat blood work in 6 months and evaluation to see if she needs additional IV iron supplementation.  #Tachycardia, probably secondary to body habitus, exertion and ambulatory inhaler.  He does not have any palpitations and rhythm is regular upon physical examination. Recommend patient to monitor heart rate at home.  If it is persistently high, she used to discuss with her primary care physician.  abs are reviewed and discussed with patient.  Ferritin level at 55, TSAT 19. TIBC 293.  Hold additional Venofer.    RTC in 6 months for MD assessment, labs (CBVC with diff, ferritin- day before), and +/- Venofer. We spent sufficient time to discuss many aspect of care, questions were  answered to patient's satisfaction. Total face to  face encounter time for this patient visit was 15 min. >50% of the time was  spent in counseling and coordination of care.   Earlie Server, MD, PhD Hematology Oncology [facility] at [facility] Pager- [phone] 08/03/2019

## 2019-08-03 NOTE — ED Triage Notes (Signed)
Patient states that she started feeling short of breath yesterday when she was being seen at the cancer center. Patient states that when she was seen yesterday her bp was 149/120 and her heart rate was above 150. Patient states that today she continues to feel short of breath and that her blood pressure was 126/90. Patient states that her heart rate was racing also.

## 2019-08-07 ENCOUNTER — Ambulatory Visit (INDEPENDENT_AMBULATORY_CARE_PROVIDER_SITE_OTHER): Payer: Medicaid Other | Admitting: Obstetrics and Gynecology

## 2019-08-07 ENCOUNTER — Other Ambulatory Visit: Payer: Self-pay

## 2019-08-07 ENCOUNTER — Encounter: Payer: Self-pay | Admitting: Obstetrics and Gynecology

## 2019-08-07 DIAGNOSIS — A630 Anogenital (venereal) warts: Secondary | ICD-10-CM | POA: Diagnosis not present

## 2019-08-07 MED ORDER — IMIQUIMOD 5 % EX CREA: TOPICAL_CREAM | CUTANEOUS | 2 refills | Status: DC

## 2019-08-07 NOTE — Progress Notes (Signed)
[name], DO   Chief Complaint  Patient presents with  . Follow-up    Genital Warts, retreat    HPI:      Ms. Lesleyann Fichter is a 33 y.o. [phone] who LMP was Patient's last menstrual period was 07/29/2019 (exact date)., presents today for ext genital wart retreatment with TCA. Treated 08/22/18, 09/11/18, and 1/20. Pt was then to start aldara tx after 1/20 treatment but pharmacy didn't have Rx. Sx are slightly improved but she may have some new spots. Would like to be treated again. No new sex partners. No other vag sx.  Annual due 10/20.  Past Medical History:  Diagnosis Date  . Allergy   . Anemia   . Asthma   . Migraine   . Panic attack   . PTSD (post-traumatic stress disorder) 2016    Past Surgical History:  Procedure Laterality Date  . APPENDECTOMY    . Plantar warts      Family History  Problem Relation Age of Onset  . Depression Mother   . Mental retardation Mother   . Bipolar disorder Mother   . Diabetes Father   . Stroke Maternal Grandfather   . Diabetes Maternal Grandfather     Social History   Socioeconomic History  . Marital status: Married    Spouse name: Not on file  . Number of children: Not on file  . Years of education: Not on file  . Highest education level: Not on file  Occupational History  . Not on file  Social Needs  . Financial resource strain: Not on file  . Food insecurity    Worry: Not on file    Inability: Not on file  . Transportation needs    Medical: Not on file    Non-medical: Not on file  Tobacco Use  . Smoking status: Never Smoker  . Smokeless tobacco: Never Used  Substance and Sexual Activity  . Alcohol use: No  . Drug use: No  . Sexual activity: Yes    Birth control/protection: Condom  Lifestyle  . Physical activity    Days per week: Not on file    Minutes per session: Not on file  . Stress: Not on file  Relationships  . Social Herbalist on phone: Not on file    Gets  together: Not on file    Attends religious service: Not on file    Active member of club or organization: Not on file    Attends meetings of clubs or organizations: Not on file    Relationship status: Not on file  . Intimate partner violence    Fear of current or ex partner: Not on file    Emotionally abused: Not on file    Physically abused: Not on file    Forced sexual activity: Not on file  Other Topics Concern  . Not on file  Social History Narrative  . Not on file    Outpatient Medications Prior to Visit  Medication Sig Dispense Refill  . albuterol (PROVENTIL HFA;VENTOLIN HFA) 108 (90 Base) MCG/ACT inhaler Inhale 2 puffs into the lungs every 6 (six) hours as needed for wheezing or shortness of breath. 1 Inhaler 2  . albuterol (PROVENTIL) (2.5 MG/3ML) 0.083% nebulizer solution Take 3 mLs (2.5 mg total) by nebulization every 6 (six) hours as needed for wheezing or shortness of breath. 150 mL 1  . Cholecalciferol (VITAMIN D-1000 MAX ST) 25 MCG (1000 UT) tablet Take by mouth.    Marland Kitchen  clotrimazole-betamethasone (LOTRISONE) cream Apply externally BID prn sx up to 2 wks 15 g 0  . dicyclomine (BENTYL) 10 MG capsule Take 1 capsule (10 mg total) by mouth 4 (four) times daily -  before meals and at bedtime. As needed for abdominal cramping bloating 30 capsule 2  . famotidine (PEPCID) 20 MG tablet Take 1 tablet (20 mg total) by mouth daily. 30 tablet 1  . fluticasone (FLONASE) 50 MCG/ACT nasal spray Place 1 spray into both nostrils daily. 16 g 2  . ibuprofen (ADVIL,MOTRIN) 600 MG tablet Take 1 tablet (600 mg total) by mouth every 8 (eight) hours as needed. 30 tablet 0  . Respiratory Therapy Supplies (NEBULIZER/ADULT MASK) KIT Use nebulizer mask and supplies kit for nebulizer machine as needed 1 each 0  . sucralfate (CARAFATE) 1 GM/10ML suspension Take 10 mLs (1 g total) by mouth 4 (four) times daily. 420 mL 1  . imiquimod (ALDARA) 5 % cream Apply topically 3 (three) times a week. Apply until total  clearance or maximum of 12 weeks 12 each 2   No facility-administered medications prior to visit.       ROS:  Review of Systems  Constitutional: Negative for fever.  Gastrointestinal: Negative for blood in stool, constipation, diarrhea, nausea and vomiting.  Genitourinary: Negative for dyspareunia, dysuria, flank pain, frequency, hematuria, urgency, vaginal bleeding, vaginal discharge and vaginal pain.  Musculoskeletal: Negative for back pain.  Skin: Negative for rash.   BREAST: No symptoms   OBJECTIVE:   Vitals:  BP 120/90   Ht _0  (1.676 m)   Wt 266 lb (120.7 kg)   LMP 07/29/2019 (Exact Date)   BMI 42.93 kg/m   Physical Exam Vitals signs reviewed.  Constitutional:      Appearance: She is well-developed.  Neck:     Musculoskeletal: Normal range of motion.  Pulmonary:     Effort: Pulmonary effort is normal.  Genitourinary:    General: Normal vulva.     Pubic Area: No rash.      Labia:        Right: No rash, tenderness or lesion.        Left: No rash, tenderness or lesion.      Vagina: Normal. No vaginal discharge, erythema or tenderness.     Cervix: Normal.     Uterus: Normal. Not enlarged and not tender.      Adnexa: Right adnexa normal and left adnexa normal.       Right: No mass or tenderness.         Left: No mass or tenderness.         Comments: MULT GENITAL WARTS; LOOK SLIGHTLY IMPROVED FROM 1/20 Musculoskeletal: Normal range of motion.  Skin:    General: Skin is warm and dry.  Neurological:     General: No focal deficit present.     Mental Status: She is alert and oriented to person, place, and time.  Psychiatric:        Mood and Affect: Mood normal.        Behavior: Behavior normal.        Thought Content: Thought content normal.        Judgment: Judgment normal.     Assessment/Plan: Genital warts - Treated with TCA. Wash in 4-6 hrs. Give it 2 wks, then start Rx aldara. F/u prn/at 10/20 annual. May need to see derm if lesions don't resolve.  - Plan: imiquimod (ALDARA) 5 % cream    Meds ordered this encounter  Medications  .  imiquimod (ALDARA) 5 % cream    Sig: Apply topically 3 (three) times a week. Apply until total clearance or maximum of 12 weeks    Dispense:  12 each    Refill:  2    Order Specific Question:   Supervising Provider    Answer:   Gae Dry [[id number]]      Return if symptoms worsen or fail to improve.  October Peery B. Jaison Petraglia, PA-C 08/07/2019 3:47 PM

## 2019-08-07 NOTE — Patient Instructions (Signed)
I value your feedback and entrusting us with your care. If you get a [facility] patient survey, I would appreciate you taking the time to let us know about your experience today. Thank you! 

## 2019-08-16 ENCOUNTER — Encounter: Payer: Self-pay | Admitting: Family Medicine

## 2019-08-16 ENCOUNTER — Ambulatory Visit (INDEPENDENT_AMBULATORY_CARE_PROVIDER_SITE_OTHER): Payer: BC Managed Care – PPO | Admitting: Family Medicine

## 2019-08-16 ENCOUNTER — Other Ambulatory Visit: Payer: Self-pay

## 2019-08-16 VITALS — BP 133/86 | HR 106 | Temp 98.6°F | Resp 16 | Ht 66.0 in | Wt 268.0 lb

## 2019-08-16 DIAGNOSIS — R7989 Other specified abnormal findings of blood chemistry: Secondary | ICD-10-CM | POA: Diagnosis not present

## 2019-08-16 DIAGNOSIS — R000 Tachycardia, unspecified: Secondary | ICD-10-CM

## 2019-08-16 NOTE — Patient Instructions (Addendum)
Thank you for coming to the office today.  Reassurance - most likely anxiety related and hydration related regarding elevated heart rate.  Keep improving lifestyle as you can.  Thyroid is likely normal, only TSH was very minimally up - we will recheck in 2 months and add other hormone levels.  Call insurance find cost and coverage of the following  Pre-Diabetes  1. Ozempic (Semaglutide injection) 2. Bydureon BCise (Exenatide ER) 3. Trulicity (Dulaglutide) 4. Victoza (Liraglutide) - DAILY  DUE for FASTING BLOOD WORK (no food or drink after midnight before the lab appointment, only water or coffee without cream/sugar on the morning of)  SCHEDULE "Lab Only" visit in the morning at the clinic for lab draw in 2 MONTHS   - Make sure Lab Only appointment is at about 1 week before your next appointment, so that results will be available  For Lab Results, once available within 2-3 days of blood draw, you can can log in to MyChart online to view your results and a brief explanation. Also, we can discuss results at next follow-up visit.    Please schedule a Follow-up Appointment to: Return in about 2 months (around 10/16/2019) for Annual Physical.  If you have any other questions or concerns, please feel free to call the office or send a message through [facility]. You may also schedule an earlier appointment if necessary.  Additionally, you may be receiving a survey about your experience at our office within a few days to 1 week by e-mail or mail. We value your feedback.  Nobie Putnam, DO [facility]

## 2019-08-16 NOTE — Progress Notes (Signed)
Subjective:    Patient ID: Jasmine Gordon, female    DOB: 07-19-1986, 33 y.o.   MRN: [id number]  Jasmine Gordon is a 33 y.o. female presenting on 08/16/2019 for Hospitalization Follow-up (tachycardia)   HPI   ED FOLLOW-UP VISIT  Hospital/Location: [facility] Date of ED Visit: 08/03/19  Reason for Presenting to ED: Tachycardia  FOLLOW-UP  - ED provider note and record have been reviewed - Patient presents today about 13 days after recent ED visit. Brief summary of recent course, patient had symptoms of tachycardia, presented to ED on 9/12, testing in ED with labs, EKG, improved PO hydration. Had slightly elevated TSH lab.  - Today reports overall has done well after discharge from ED. Symptoms of tachycardia have improved some - but she has known history of episodic tachycardia based on vital sign review. - She is asking about thyroid lab test, showed TSH 4.7 - She has fam history of thyroid problem - She is followed by Heme has had improved iron deficiency  IUD removed, no further bleeding, now iron normalized Admits dehydration prior to ED visit also some anxiety component. Tried Body Armor drinks for hydration to help, more energy  Health Maintenance: Due for Flu Shot, declines today despite counseling on benefits   Depression screen [facility] 2/9 08/16/2019 05/28/2019 11/06/2018  Decreased Interest 0 2 0  Down, Depressed, Hopeless 0 1 0  PHQ - 2 Score 0 3 0  Altered sleeping 1 2 0  Tired, decreased energy 0 3 0  Change in appetite 2 1 0  Feeling bad or failure about yourself  2 1 0  Trouble concentrating 0 1 0  Moving slowly or fidgety/restless 0 1 0  Suicidal thoughts 0 0 0  PHQ-9 Score 5 12 0  Difficult doing work/chores Somewhat difficult Somewhat difficult Not difficult at all    Social History   Tobacco Use  . Smoking status: Never Smoker  . Smokeless tobacco: Never Used  Substance Use Topics  . Alcohol use: No  . Drug use: No    Review of Systems Per  HPI unless specifically indicated above     Objective:    BP 133/86   Pulse (!) 106   Temp 98.6 F (37 C) (Oral)   Resp 16   Ht 5\' 6"  (1.676 m)   Wt 268 lb (121.6 kg)   LMP 07/29/2019 (Exact Date)   BMI 43.26 kg/m   Wt Readings from Last 3 Encounters:  08/16/19 268 lb (121.6 kg)  08/07/19 266 lb (120.7 kg)  08/03/19 265 lb (120.2 kg)    Physical Exam Vitals signs and nursing note reviewed.  Constitutional:      General: She is not in acute distress.    Appearance: She is well-developed. She is not diaphoretic.     Comments: Well-appearing, comfortable, cooperative, obese  HENT:     Head: Normocephalic and atraumatic.  Eyes:     General:        Right eye: No discharge.        Left eye: No discharge.     Conjunctiva/sclera: Conjunctivae normal.  Neck:     Musculoskeletal: Normal range of motion and neck supple.     Thyroid: No thyromegaly.  Cardiovascular:     Rate and Rhythm: Regular rhythm. Tachycardia present.     Heart sounds: Normal heart sounds. No murmur.  Pulmonary:     Effort: Pulmonary effort is normal. No respiratory distress.     Breath sounds: Normal breath  sounds. No wheezing or rales.  Musculoskeletal: Normal range of motion.  Lymphadenopathy:     Cervical: No cervical adenopathy.  Skin:    General: Skin is warm and dry.     Findings: No erythema or rash.  Neurological:     Mental Status: She is alert and oriented to person, place, and time.  Psychiatric:        Behavior: Behavior normal.     Comments: Well groomed, good eye contact, normal speech and thoughts    Results for orders placed or performed during the hospital encounter of 08/03/19  Basic metabolic panel  Result Value Ref Range   Sodium 140 135 - 145 mmol/L   Potassium 3.5 3.5 - 5.1 mmol/L   Chloride 107 98 - 111 mmol/L   CO2 23 22 - 32 mmol/L   Glucose, Bld 110 (H) 70 - 99 mg/dL   BUN 10 6 - 20 mg/dL   Creatinine, Ser 4.68 0.44 - 1.00 mg/dL   Calcium 9.1 8.9 - 03.2 mg/dL   GFR  calc non Af Amer >60 >60 mL/min   GFR calc Af Amer >60 >60 mL/min   Anion gap 10 5 - 15  CBC  Result Value Ref Range   WBC 4.8 4.0 - 10.5 K/uL   RBC 4.46 3.87 - 5.11 MIL/uL   Hemoglobin 11.8 (L) 12.0 - 15.0 g/dL   HCT 12.2 48.2 - 50.0 %   MCV 83.2 80.0 - 100.0 fL   MCH 26.5 26.0 - 34.0 pg   MCHC 31.8 30.0 - 36.0 g/dL   RDW 37.0 48.8 - 89.1 %   Platelets 222 150 - 400 K/uL   nRBC 0.0 0.0 - 0.2 %  TSH  Result Value Ref Range   TSH 4.748 (H) 0.350 - 4.500 uIU/mL  Troponin I (High Sensitivity)  Result Value Ref Range   Troponin I (High Sensitivity) <2 <18 ng/L      Assessment & Plan:   Problem List Items Addressed This Visit    None    Visit Diagnoses    Tachycardia    -  Primary   Elevated TSH          Clinically still episodic tachycardia, described today anxiety among other factors, seems to be labile, but does improve with rest. Otherwise relatively asymptomatic. - Suspected comorbid conditions some reduced hydration, chronic elevated HR and anxiety - Unlikely related to TSH, was 4.7 minimal elevated, does not make sense for tachycardia even if hypothyroidism diagnosis  Reassurance Improve hydration, limit caffeine Discussed strategy for reducing anxiety - she declines medication at this point Repeat lab testing in 3 months for upcoming physical Continue with iron replacement now resolved  No orders of the defined types were placed in this encounter.     Follow up plan: Return in about 2 months (around 10/16/2019) for Annual Physical.  Future labs ordered for 10/25/19  [name], DO [facility] 08/16/2019, 11:50 AM

## 2019-08-18 ENCOUNTER — Other Ambulatory Visit: Payer: Self-pay | Admitting: Family Medicine

## 2019-08-18 DIAGNOSIS — E538 Deficiency of other specified B group vitamins: Secondary | ICD-10-CM

## 2019-08-18 DIAGNOSIS — E559 Vitamin D deficiency, unspecified: Secondary | ICD-10-CM

## 2019-08-18 DIAGNOSIS — E7800 Pure hypercholesterolemia, unspecified: Secondary | ICD-10-CM

## 2019-08-18 DIAGNOSIS — Z0000 Encounter for general adult medical examination without abnormal findings: Secondary | ICD-10-CM

## 2019-08-18 DIAGNOSIS — R7989 Other specified abnormal findings of blood chemistry: Secondary | ICD-10-CM

## 2019-08-18 DIAGNOSIS — D500 Iron deficiency anemia secondary to blood loss (chronic): Secondary | ICD-10-CM

## 2019-08-18 DIAGNOSIS — R7303 Prediabetes: Secondary | ICD-10-CM

## 2019-08-19 ENCOUNTER — Encounter: Payer: Self-pay | Admitting: Obstetrics and Gynecology

## 2019-08-29 DIAGNOSIS — J4530 Mild persistent asthma, uncomplicated: Secondary | ICD-10-CM

## 2019-08-30 MED ORDER — ALBUTEROL SULFATE HFA 108 (90 BASE) MCG/ACT IN AERS: 2.0000 | INHALATION_SPRAY | Freq: Four times a day (QID) | RESPIRATORY_TRACT | 0 refills | Status: DC | PRN

## 2019-09-03 NOTE — Telephone Encounter (Signed)
The pt called complaining of pressure sensation behind her Left eye and headache only on the left side x 2weeks. She state that she have had some lifestyle changes recently. She is currently working from home on a computer where she sit and look at the computer screen for 8 hrs. She also recently started exercise and unsure if these changes could contribute to the new onset headaches and eye discomfort. I did instruct the patient if her symptoms worsen that she needs to seek immediate care. Please advise

## 2019-09-17 ENCOUNTER — Other Ambulatory Visit: Payer: Self-pay | Admitting: Nurse Practitioner

## 2019-09-17 DIAGNOSIS — J4530 Mild persistent asthma, uncomplicated: Secondary | ICD-10-CM

## 2019-09-18 MED ORDER — ALBUTEROL SULFATE HFA 108 (90 BASE) MCG/ACT IN AERS: 2.0000 | INHALATION_SPRAY | Freq: Four times a day (QID) | RESPIRATORY_TRACT | 2 refills | Status: DC | PRN

## 2019-09-24 ENCOUNTER — Other Ambulatory Visit: Payer: Self-pay

## 2019-09-24 ENCOUNTER — Emergency Department
Admission: EM | Admit: 2019-09-24 | Discharge: 2019-09-24 | Disposition: A | Discharge status: Home / Self Care | Payer: BC Managed Care – PPO | Attending: Emergency Medicine | Admitting: Emergency Medicine

## 2019-09-24 DIAGNOSIS — J45909 Unspecified asthma, uncomplicated: Secondary | ICD-10-CM | POA: Diagnosis not present

## 2019-09-24 DIAGNOSIS — Z20828 Contact with and (suspected) exposure to other viral communicable diseases: Secondary | ICD-10-CM | POA: Diagnosis not present

## 2019-09-24 DIAGNOSIS — Z79899 Other long term (current) drug therapy: Secondary | ICD-10-CM | POA: Diagnosis not present

## 2019-09-24 DIAGNOSIS — J069 Acute upper respiratory infection, unspecified: Secondary | ICD-10-CM | POA: Diagnosis not present

## 2019-09-24 DIAGNOSIS — R05 Cough: Secondary | ICD-10-CM | POA: Diagnosis present

## 2019-09-24 DIAGNOSIS — Z20822 Contact with and (suspected) exposure to covid-19: Secondary | ICD-10-CM

## 2019-09-24 LAB — CBC
HCT: 36.3 % (ref 36.0–46.0)
Hemoglobin: 11.6 g/dL — ABNORMAL LOW (ref 12.0–15.0)
MCH: 26.7 pg (ref 26.0–34.0)
MCHC: 32 g/dL (ref 30.0–36.0)
MCV: 83.6 fL (ref 80.0–100.0)
Platelets: 233 10*3/uL (ref 150–400)
RBC: 4.34 MIL/uL (ref 3.87–5.11)
RDW: 12.7 % (ref 11.5–15.5)
WBC: 4.9 10*3/uL (ref 4.0–10.5)
nRBC: 0 % (ref 0.0–0.2)

## 2019-09-24 LAB — URINALYSIS, COMPLETE (UACMP) WITH MICROSCOPIC
Bilirubin Urine: NEGATIVE
Glucose, UA: NEGATIVE mg/dL
Ketones, ur: NEGATIVE mg/dL
Leukocytes,Ua: NEGATIVE
Nitrite: NEGATIVE
Protein, ur: 30 mg/dL — AB
RBC / HPF: >50 RBC/hpf — ABNORMAL HIGH (ref 0–5)
Specific Gravity, Urine: 1.02 (ref 1.005–1.030)
pH: 7 (ref 5.0–8.0)

## 2019-09-24 LAB — COMPREHENSIVE METABOLIC PANEL
ALT: 12 U/L (ref 0–44)
AST: 16 U/L (ref 15–41)
Albumin: 4.1 g/dL (ref 3.5–5.0)
Alkaline Phosphatase: 46 U/L (ref 38–126)
Anion gap: 10 (ref 5–15)
BUN: 10 mg/dL (ref 6–20)
CO2: 24 mmol/L (ref 22–32)
Calcium: 9.5 mg/dL (ref 8.9–10.3)
Chloride: 105 mmol/L (ref 98–111)
Creatinine, Ser: 0.82 mg/dL (ref 0.44–1.00)
GFR calc Af Amer: >60 mL/min (ref >60)
GFR calc non Af Amer: >60 mL/min (ref >60)
Glucose, Bld: 103 mg/dL — ABNORMAL HIGH (ref 70–99)
Potassium: 3.4 mmol/L — ABNORMAL LOW (ref 3.5–5.1)
Sodium: 139 mmol/L (ref 135–145)
Total Bilirubin: 0.4 mg/dL (ref 0.3–1.2)
Total Protein: 7.6 g/dL (ref 6.5–8.1)

## 2019-09-24 LAB — POCT PREGNANCY, URINE: Preg Test, Ur: NEGATIVE

## 2019-09-24 LAB — LIPASE, BLOOD: Lipase: 21 U/L (ref 11–51)

## 2019-09-24 MED ORDER — ONDANSETRON HCL 4 MG PO TABS: 4.0000 mg | ORAL_TABLET | Freq: Three times a day (TID) | ORAL | 0 refills | Status: DC | PRN

## 2019-09-24 MED ORDER — BENZONATATE 100 MG PO CAPS: 100.0000 mg | ORAL_CAPSULE | Freq: Four times a day (QID) | ORAL | 0 refills | Status: DC | PRN

## 2019-09-24 NOTE — ED Provider Notes (Signed)
[facility] Emergency Department Provider Note  ____________________________________________   I have reviewed the triage vital signs and the nursing notes.   HISTORY  Chief Complaint Cough and Abdominal Pain   History limited by: Not Limited   HPI Jasmine Gordon is a 33 y.o. female who presents to the emergency department today with multiple medical complaints. Among her complaints are abdominal discomfort. This has been present for the past roughly 5 days. Generalized discomfort. Has been accompanied by nausea. She also has complaints of chest tightness and cough. Has been trying her inhaler at home without any significant relief. Also complaining of nasal congestion and discomfort in her throat. Has had occasional nosebleed from her left nares. The patient also has had headache, located primarily on the left side of her face. She denies any fevers. Has been around a neighbor who has a cough and works at [employer]. No known direct covid contacts.    Records reviewed. Per medical record review patient has a history of asthma.   Past Medical History:  Diagnosis Date  . Allergy   . Anemia   . Asthma   . Migraine   . Panic attack   . PTSD (post-traumatic stress disorder) 2016    Patient Active Problem List   Diagnosis Date Noted  . Genital warts 09/11/2018  . Positive TB test 09/25/2017  . Allergic contact dermatitis due to metals 05/17/2017  . Pre-diabetes 03/06/2017  . Hyperlipidemia 03/06/2017  . Allergic reaction 02/08/2017  . Urticaria due to food allergy 02/08/2017  . Vitamin D deficiency 11/08/2016  . Iron deficiency anemia due to chronic blood loss 10/03/2016  . Menorrhagia with regular cycle 06/27/2016  . Mild persistent asthma 06/17/2016  . Anxiety and depression 06/17/2016  . Morbid obesity with BMI of 40.0-44.9, adult ([place]) 06/17/2016    Past Surgical History:  Procedure Laterality Date  . APPENDECTOMY    . Plantar warts       Prior to Admission medications   Medication Sig Start Date End Date Taking? Authorizing Provider  albuterol (PROVENTIL) (2.5 MG/3ML) 0.083% nebulizer solution Take 3 mLs (2.5 mg total) by nebulization every 6 (six) hours as needed for wheezing or shortness of breath. 11/06/18   Karamalegos, Devonne Doughty, DO  albuterol (VENTOLIN HFA) 108 (90 Base) MCG/ACT inhaler Inhale 2 puffs into the lungs every 6 (six) hours as needed for wheezing or shortness of breath. 09/18/19   Karamalegos, Devonne Doughty, DO  Cholecalciferol (VITAMIN D-1000 MAX ST) 25 MCG (1000 UT) tablet Take by mouth.    [provider]  clotrimazole-betamethasone (LOTRISONE) cream Apply externally BID prn sx up to 2 wks 2/0/10   Copland, Alicia B, PA-C  dicyclomine (BENTYL) 10 MG capsule Take 1 capsule (10 mg total) by mouth 4 (four) times daily -  before meals and at bedtime. As needed for abdominal cramping bloating 07/17/19   Parks Ranger, Devonne Doughty, DO  famotidine (PEPCID) 20 MG tablet Take 1 tablet (20 mg total) by mouth daily. 07/20/19 07/19/20  Schuyler Amor, MD  fluticasone (FLONASE) 50 MCG/ACT nasal spray Place 1 spray into both nostrils daily. 08/20/18 08/20/19  Lannie Fields, PA-C  ibuprofen (ADVIL,MOTRIN) 600 MG tablet Take 1 tablet (600 mg total) by mouth every 8 (eight) hours as needed. 12/10/18   Darel Hong, MD  imiquimod Ler[place] Sea) 5 % cream Apply topically 3 (three) times a week. Apply until total clearance or maximum of 12 weeks 0/71/21   Copland, Deirdre Evener, PA-C  Respiratory Therapy Supplies (  NEBULIZER/ADULT MASK) KIT Use nebulizer mask and supplies kit for nebulizer machine as needed 03/18/19   Karamalegos, Alexander J, DO  sucralfate (CARAFATE) 1 GM/10ML suspension Take 10 mLs (1 g total) by mouth 4 (four) times daily. 07/20/19 07/19/20  Schuyler Amor, MD    Allergies Reglan [metoclopramide]  Family History  Problem Relation Age of Onset  . Depression Mother   . Mental retardation Mother   . Bipolar  disorder Mother   . Diabetes Father   . Stroke Maternal Grandfather   . Diabetes Maternal Grandfather     Social History Social History   Tobacco Use  . Smoking status: Never Smoker  . Smokeless tobacco: Never Used  Substance Use Topics  . Alcohol use: No  . Drug use: No    Review of Systems Constitutional: No fever/chills Eyes: Positive for left eye pain. ENT: Positive for congestion, sore throat. Nose bleed.  Cardiovascular: Positive for chest tightness, cough. Respiratory: Positive for cough. Gastrointestinal: Positive for abdominal pain and cough.   Genitourinary: Negative for dysuria. Musculoskeletal: Negative for back pain. Skin: Negative for rash. Neurological: Positive for headache.  ____________________________________________   PHYSICAL EXAM:  VITAL SIGNS: ED Triage Vitals  Enc Vitals Group     BP 09/24/19 1534 134/84     Pulse Rate 09/24/19 1534 (!) 115     Resp 09/24/19 1534 17     Temp 09/24/19 1534 99.6 F (37.6 C)     Temp Source 09/24/19 1534 Oral     SpO2 09/24/19 1534 99 %     Weight 09/24/19 1535 260 lb (117.9 kg)     Height 09/24/19 1535 5' 6" (1.676 m)     Head Circumference --      Peak Flow --      Pain Score 09/24/19 1544 4   Constitutional: Alert and oriented.  Eyes: Conjunctivae are normal.  ENT      Head: Normocephalic and atraumatic.      Nose: No congestion/rhinnorhea.      Mouth/Throat: Mucous membranes are moist.      Neck: No stridor. Hematological/Lymphatic/Immunilogical: No cervical lymphadenopathy. Cardiovascular: Normal rate, regular rhythm.  No murmurs, rubs, or gallops.  Respiratory: Normal respiratory effort without tachypnea nor retractions. Breath sounds are clear and equal bilaterally. No wheezes/rales/rhonchi. Gastrointestinal: Soft and non tender. No rebound. No guarding.  Genitourinary: Deferred Musculoskeletal: Normal range of motion in all extremities. No lower extremity edema. Neurologic:  Normal speech and  language. No gross focal neurologic deficits are appreciated.  Skin:  Skin is warm, dry and intact. No rash noted. Psychiatric: Mood and affect are normal. Speech and behavior are normal. Patient exhibits appropriate insight and judgment.  ____________________________________________    LABS (pertinent positives/negatives)  Upreg neg CMP wnl except k 3.4, glu 103 CBC wbc 4.9, hgb 11.6, plt 233  ____________________________________________   EKG  I, Nance Pear, attending physician, personally viewed and interpreted this EKG  EKG Time: 1537 Rate: 106 Rhythm: sinus tachycardia Axis: normal Intervals: qtc 433 QRS: narrow ST changes: no st elevation Impression: abnormal ekg  ____________________________________________    RADIOLOGY  None  ____________________________________________   PROCEDURES  Procedures  ____________________________________________   INITIAL IMPRESSION / ASSESSMENT AND PLAN / ED COURSE  Pertinent labs & imaging results that were available during my care of the patient were reviewed by me and considered in my medical decision making (see chart for details).   Patient presents to the emergency department today with numerous medical complaints. Blood work without concerning  leukocytosis. At this point I do think likely URI cause of patient's myriad symptoms. Given covid pandemic will plan on testing patient for covid. Discussed symptomatic support. No focal abnormality on ausculation which would suggest pneumonia. Did discuss obtaining and offered x-ray to patient however I do have low suspicion for significant finding. Patient felt comfortable deferring.  ____________________________________________   FINAL CLINICAL IMPRESSION(S) / ED DIAGNOSES  Final diagnoses:  Upper respiratory tract infection, unspecified type  Suspected COVID-19 virus infection     Note: This dictation was prepared with Dragon dictation. Any transcriptional errors  that result from this process are unintentional     Nance Pear, MD 09/24/19 1718

## 2019-09-24 NOTE — ED Triage Notes (Signed)
Reports that for last 2 days she has had abdominal pain, nausea and emesis. Cough, sore throat and chest discomfort with coughing. Headache. Pt alert and oriented X4, cooperative, RR even and unlabored, color WNL. Pt in NAD.

## 2019-09-24 NOTE — ED Notes (Signed)
Patient was requesting her oxygen saturation be checked, same was 100%.

## 2019-09-24 NOTE — Discharge Instructions (Addendum)
Please seek medical attention for any high fevers, chest pain, shortness of breath, change in behavior, persistent vomiting, bloody stool or any other new or concerning symptoms.  

## 2019-09-26 LAB — NOVEL CORONAVIRUS, NAA (HOSP ORDER, SEND-OUT TO REF LAB; TAT 18-24 HRS): SARS-CoV-2, NAA: NOT DETECTED

## 2019-10-01 DIAGNOSIS — R519 Headache, unspecified: Secondary | ICD-10-CM

## 2019-10-25 ENCOUNTER — Encounter: Payer: Self-pay | Admitting: Family Medicine

## 2019-10-25 ENCOUNTER — Other Ambulatory Visit: Payer: Medicaid Other

## 2019-10-25 ENCOUNTER — Ambulatory Visit (INDEPENDENT_AMBULATORY_CARE_PROVIDER_SITE_OTHER): Payer: BC Managed Care – PPO | Admitting: Family Medicine

## 2019-10-25 ENCOUNTER — Other Ambulatory Visit: Payer: Self-pay

## 2019-10-25 VITALS — BP 124/90 | HR 106 | Temp 97.5°F | Resp 18 | Ht 66.0 in | Wt 259.4 lb

## 2019-10-25 DIAGNOSIS — D500 Iron deficiency anemia secondary to blood loss (chronic): Secondary | ICD-10-CM

## 2019-10-25 DIAGNOSIS — F410 Panic disorder [episodic paroxysmal anxiety] without agoraphobia: Secondary | ICD-10-CM

## 2019-10-25 DIAGNOSIS — F419 Anxiety disorder, unspecified: Secondary | ICD-10-CM | POA: Diagnosis not present

## 2019-10-25 DIAGNOSIS — F329 Major depressive disorder, single episode, unspecified: Secondary | ICD-10-CM

## 2019-10-25 DIAGNOSIS — E538 Deficiency of other specified B group vitamins: Secondary | ICD-10-CM

## 2019-10-25 DIAGNOSIS — R7989 Other specified abnormal findings of blood chemistry: Secondary | ICD-10-CM

## 2019-10-25 DIAGNOSIS — Z0000 Encounter for general adult medical examination without abnormal findings: Secondary | ICD-10-CM

## 2019-10-25 DIAGNOSIS — R7303 Prediabetes: Secondary | ICD-10-CM

## 2019-10-25 DIAGNOSIS — Z6841 Body Mass Index (BMI) 40.0 and over, adult: Secondary | ICD-10-CM

## 2019-10-25 DIAGNOSIS — F32A Depression, unspecified: Secondary | ICD-10-CM

## 2019-10-25 DIAGNOSIS — E7800 Pure hypercholesterolemia, unspecified: Secondary | ICD-10-CM

## 2019-10-25 DIAGNOSIS — E559 Vitamin D deficiency, unspecified: Secondary | ICD-10-CM

## 2019-10-25 MED ORDER — BUPROPION HCL ER (SR) 150 MG PO TB12: 150.0000 mg | ORAL_TABLET | Freq: Two times a day (BID) | ORAL | 2 refills | Status: DC

## 2019-10-25 MED ORDER — BUSPIRONE HCL 5 MG PO TABS: 5.0000 mg | ORAL_TABLET | Freq: Two times a day (BID) | ORAL | 2 refills | Status: DC | PRN

## 2019-10-25 NOTE — Progress Notes (Signed)
Subjective:    Patient ID: Jasmine Gordon, female    DOB: 1985-12-16, 33 y.o.   MRN: [id number]  Jasmine Gordon is a 33 y.o. female presenting on 10/25/2019 for Anxiety (situational issues, daddy just admitted in the hospital )  Patient presents for a same day appointment.  HPI   ANXIETY Chronic history of episodic anxiety and panic, previously treated in 2017 and around that time with medications. Has done well since but often does have anxiety provoking episodes due to stressors.  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 - Significant life stressors. Also father is now in the hospital, there are concerns for his health just newly learned this last night. He has heart issues and concern with possible COVID. - In past she did take SSRI in past with Lexapro was effective, other SSRI sertraline zoloft less effective she believes. Also had tried hydroxyzine PRN with less results. Buspar in past was effective if she can recall took it twice most days. She did even have course of Clonazepam in past.  Morbid Obesity BMI >41 Weight remains 259-260 lbs, some improvement overall In past was on Contrave trial, she did well with the buproprion component of this med and felt more "calm" even and prefers this for anxiety treatment. She has mixed mood/anxiety symptoms, See scores below  Health Maintenance: Due for Flu Shot, declines today despite counseling on benefits   Depression screen [facility] 2/9 10/25/2019 08/16/2019 05/28/2019  Decreased Interest 2 0 2  Down, Depressed, Hopeless 2 0 1  PHQ - 2 Score 4 0 3  Altered sleeping 0 1 2  Tired, decreased energy 2 0 3  Change in appetite 0 2 1  Feeling bad or failure about yourself  3 2 1   Trouble  concentrating 2 0 1  Moving slowly or fidgety/restless 0 0 1  Suicidal thoughts 0 0 0  PHQ-9 Score 11 5 12   Difficult doing work/chores Very difficult Somewhat difficult Somewhat difficult    GAD 7 : Generalized Anxiety Score 10/25/2019 08/16/2019 05/28/2019 11/06/2018  Nervous, Anxious, on Edge 3 2 2  0  Control/stop worrying 3 1 1  0  Worry too much - different things 3 1 2  0  Trouble relaxing 2 1 3  0  Restless 3 0 2 0  Easily annoyed or irritable 2 1 1  0  Afraid - awful might happen 3 2 3  0  Total GAD 7 Score 19 8 14  0  Anxiety Difficulty Extremely difficult Somewhat difficult Somewhat difficult Not difficult at all     Social History   Tobacco Use  . Smoking status: Never Smoker  . Smokeless tobacco: Never Used  Substance Use Topics  . Alcohol use: No  . Drug use: No    Review of Systems Per HPI unless specifically indicated above     Objective:    BP 124/90 (BP Location: Right Arm, Patient Position: Sitting, Cuff Size: Large)   Pulse (!) 106   Temp (!) 97.5 F (36.4 C) (Oral)   Resp 18   Ht 5\' 6"  (1.676 m)   Wt 259 lb 6.4 oz (117.7 kg)   BMI 41.87 kg/m   Wt Readings from Last 3 Encounters:  10/25/19 259 lb 6.4 oz (117.7 kg)  09/24/19 260  lb (117.9 kg)  08/16/19 268 lb (121.6 kg)    Physical Exam Vitals signs and nursing note reviewed.  Constitutional:      General: She is not in acute distress.    Appearance: She is well-developed. She is not diaphoretic.     Comments: Well-appearing, comfortable, cooperative  HENT:     Head: Normocephalic and atraumatic.  Eyes:     General:        Right eye: No discharge.        Left eye: No discharge.     Conjunctiva/sclera: Conjunctivae normal.  Cardiovascular:     Rate and Rhythm: Normal rate.  Pulmonary:     Effort: Pulmonary effort is normal.  Skin:    General: Skin is warm and dry.     Findings: No erythema or rash.  Neurological:     Mental Status: She is alert and oriented to person, place, and time.   Psychiatric:        Behavior: Behavior normal.     Comments: Well groomed, good eye contact, normal speech and thoughts. Not anxious appearing currently.    Results for orders placed or performed during the hospital encounter of 09/24/19  Novel Coronavirus, NAA (Hosp order, Send-out to Ref Lab; TAT 18-24 hrs   Specimen: Nasopharyngeal Swab; Respiratory  Result Value Ref Range   SARS-CoV-2, NAA NOT DETECTED NOT DETECTED   Coronavirus Source NASOPHARYNGEAL   Lipase, blood  Result Value Ref Range   Lipase 21 11 - 51 U/L  Comprehensive metabolic panel  Result Value Ref Range   Sodium 139 135 - 145 mmol/L   Potassium 3.4 (L) 3.5 - 5.1 mmol/L   Chloride 105 98 - 111 mmol/L   CO2 24 22 - 32 mmol/L   Glucose, Bld 103 (H) 70 - 99 mg/dL   BUN 10 6 - 20 mg/dL   Creatinine, Ser 1.610.82 0.44 - 1.00 mg/dL   Calcium 9.5 8.9 - 09.610.3 mg/dL   Total Protein 7.6 6.5 - 8.1 g/dL   Albumin 4.1 3.5 - 5.0 g/dL   AST 16 15 - 41 U/L   ALT 12 0 - 44 U/L   Alkaline Phosphatase 46 38 - 126 U/L   Total Bilirubin 0.4 0.3 - 1.2 mg/dL   GFR calc non Af Amer >60 >60 mL/min   GFR calc Af Amer >60 >60 mL/min   Anion gap 10 5 - 15  CBC  Result Value Ref Range   WBC 4.9 4.0 - 10.5 K/uL   RBC 4.34 3.87 - 5.11 MIL/uL   Hemoglobin 11.6 (L) 12.0 - 15.0 g/dL   HCT 04.536.3 40.936.0 - 81.146.0 %   MCV 83.6 80.0 - 100.0 fL   MCH 26.7 26.0 - 34.0 pg   MCHC 32.0 30.0 - 36.0 g/dL   RDW 91.412.7 78.211.5 - 95.615.5 %   Platelets 233 150 - 400 K/uL   nRBC 0.0 0.0 - 0.2 %  Urinalysis, Complete w Microscopic  Result Value Ref Range   Color, Urine YELLOW (A) YELLOW   APPearance CLEAR (A) CLEAR   Specific Gravity, Urine 1.020 1.005 - 1.030   pH 7.0 5.0 - 8.0   Glucose, UA NEGATIVE NEGATIVE mg/dL   Hgb urine dipstick LARGE (A) NEGATIVE   Bilirubin Urine NEGATIVE NEGATIVE   Ketones, ur NEGATIVE NEGATIVE mg/dL   Protein, ur 30 (A) NEGATIVE mg/dL   Nitrite NEGATIVE NEGATIVE   Leukocytes,Ua NEGATIVE NEGATIVE   RBC / HPF >50 (H) 0 - 5 RBC/hpf  WBC, UA 0-5 0 - 5 WBC/hpf   Bacteria, UA RARE (A) NONE SEEN   Squamous Epithelial / LPF 0-5 0 - 5   Mucus PRESENT   Pregnancy, urine POC  Result Value Ref Range   Preg Test, Ur NEGATIVE NEGATIVE      Assessment & Plan:   Problem List Items Addressed This Visit    Morbid obesity with BMI of 40.0-44.9, adult (HCC)   Anxiety and depression - Primary   Relevant Medications   nortriptyline (PAMELOR) 10 MG capsule   buPROPion (WELLBUTRIN SR) 150 MG 12 hr tablet   busPIRone (BUSPAR) 5 MG tablet    Other Visit Diagnoses    Panic attacks       Relevant Medications   nortriptyline (PAMELOR) 10 MG capsule   buPROPion (WELLBUTRIN SR) 150 MG 12 hr tablet   busPIRone (BUSPAR) 5 MG tablet      Clinically persistent anxiety with panic, triggered now with multiple significant stressors Work / COVID / family health related Previous chronic history of anxiety with panic, years prior on SSRIs Lexapro, Zoloft, also Buspar, Hydroxyzine, Clonazepam see HPI  Plan - Trial on Bupriopion SR 150mg  BID for now - for both mood and anxiety, has helped her in past while on Contrave to feel more calm and less anxious - Add Buspar 5mg  BID PRN for anxiety - discussed regular use vs intermittent - Already on Nortriptyline 10mg  for headache/migraines, discussed can be beneficial for mood as well  Future reconsider Lexapro or adjust dosage Reviewed possible BDZ CLonazepam very short term, but we agree to hold off on this class of medication now   Meds ordered this encounter  Medications  . buPROPion (WELLBUTRIN SR) 150 MG 12 hr tablet    Sig: Take 1 tablet (150 mg total) by mouth 2 (two) times daily.    Dispense:  60 tablet    Refill:  2  . busPIRone (BUSPAR) 5 MG tablet    Sig: Take 1 tablet (5 mg total) by mouth 2 (two) times daily as needed (anxiety panic).    Dispense:  60 tablet    Refill:  2      Follow up plan: Return if symptoms worsen or fail to improve, for anxiety.    , DO [facility] 10/25/2019, 9:00 AM

## 2019-10-25 NOTE — Patient Instructions (Addendum)
Thank you for coming to the office today.  Start Buprioprion 150mg  12 hr pill one twice a day every day  Add Buspar 5mg  up to twice a day as needed for anxiety panic - can try this only intermittent when you need or can be done 1-2 times a day every day for more stabilizing effect.  Future consider restart Lexapro.  Also see if you benefit from Nortriptyline for headache may help mood/anxiety  Please schedule a Follow-up Appointment to: Return if symptoms worsen or fail to improve, for anxiety.  If you have any other questions or concerns, please feel free to call the office or send a message through [place]. You may also schedule an earlier appointment if necessary.  Additionally, you may be receiving a survey about your experience at our office within a few days to 1 week by e-mail or mail. We value your feedback.  Nobie Putnam, DO [place]

## 2019-10-26 LAB — COMPLETE METABOLIC PANEL WITH GFR
AG Ratio: 1.7 (calc) (ref 1.0–2.5)
ALT: 9 U/L (ref 6–29)
AST: 15 U/L (ref 10–30)
Albumin: 4.4 g/dL (ref 3.6–5.1)
Alkaline phosphatase (APISO): 45 U/L (ref 31–125)
BUN: 13 mg/dL (ref 7–25)
CO2: 23 mmol/L (ref 20–32)
Calcium: 9.4 mg/dL (ref 8.6–10.2)
Chloride: 107 mmol/L (ref 98–110)
Creat: 0.79 mg/dL (ref 0.50–1.10)
GFR, Est African American: 116 mL/min/{1.73_m2} (ref >=60)
GFR, Est Non African American: 100 mL/min/{1.73_m2} (ref >=60)
Globulin: 2.6 g/dL (calc) (ref 1.9–3.7)
Glucose, Bld: 92 mg/dL (ref 65–99)
Potassium: 3.8 mmol/L (ref 3.5–5.3)
Sodium: 139 mmol/L (ref 135–146)
Total Bilirubin: 0.4 mg/dL (ref 0.2–1.2)
Total Protein: 7 g/dL (ref 6.1–8.1)

## 2019-10-26 LAB — CBC WITH DIFFERENTIAL/PLATELET
Absolute Monocytes: 199 cells/uL — ABNORMAL LOW (ref 200–950)
Basophils Absolute: 20 cells/uL (ref 0–200)
Basophils Relative: 0.5 %
Eosinophils Absolute: 51 cells/uL (ref 15–500)
Eosinophils Relative: 1.3 %
HCT: 37.9 % (ref 35.0–45.0)
Hemoglobin: 12 g/dL (ref 11.7–15.5)
Lymphs Abs: 1775 cells/uL (ref 850–3900)
MCH: 26.7 pg — ABNORMAL LOW (ref 27.0–33.0)
MCHC: 31.7 g/dL — ABNORMAL LOW (ref 32.0–36.0)
MCV: 84.2 fL (ref 80.0–100.0)
MPV: 11 fL (ref 7.5–12.5)
Monocytes Relative: 5.1 %
Neutro Abs: 1856 cells/uL (ref 1500–7800)
Neutrophils Relative %: 47.6 %
Platelets: 233 10*3/uL (ref 140–400)
RBC: 4.5 10*6/uL (ref 3.80–5.10)
RDW: 12.8 % (ref 11.0–15.0)
Total Lymphocyte: 45.5 %
WBC: 3.9 10*3/uL (ref 3.8–10.8)

## 2019-10-26 LAB — LIPID PANEL
Cholesterol: 225 mg/dL — ABNORMAL HIGH (ref <200)
HDL: 47 mg/dL — ABNORMAL LOW (ref >=50)
LDL Cholesterol (Calc): 157 mg/dL (calc) — ABNORMAL HIGH
Non-HDL Cholesterol (Calc): 178 mg/dL (calc) — ABNORMAL HIGH (ref <130)
Total CHOL/HDL Ratio: 4.8 (calc) (ref <5.0)
Triglycerides: 98 mg/dL (ref <150)

## 2019-10-26 LAB — TSH: TSH: 2.81 mIU/L

## 2019-10-26 LAB — HEMOGLOBIN A1C
Hgb A1c MFr Bld: 5.7 % of total Hgb — ABNORMAL HIGH (ref <5.7)
Mean Plasma Glucose: 117 (calc)
eAG (mmol/L): 6.5 (calc)

## 2019-10-26 LAB — VITAMIN B12: Vitamin B-12: 388 pg/mL (ref 200–1100)

## 2019-10-26 LAB — VITAMIN D 25 HYDROXY (VIT D DEFICIENCY, FRACTURES): Vit D, 25-Hydroxy: 14 ng/mL — ABNORMAL LOW (ref 30–100)

## 2019-10-26 LAB — T4, FREE: Free T4: 1 ng/dL (ref 0.8–1.8)

## 2019-10-27 ENCOUNTER — Other Ambulatory Visit: Payer: Self-pay

## 2019-10-27 ENCOUNTER — Emergency Department: Payer: BC Managed Care – PPO

## 2019-10-27 DIAGNOSIS — R0789 Other chest pain: Secondary | ICD-10-CM | POA: Insufficient documentation

## 2019-10-27 DIAGNOSIS — F419 Anxiety disorder, unspecified: Secondary | ICD-10-CM | POA: Insufficient documentation

## 2019-10-27 DIAGNOSIS — Z79899 Other long term (current) drug therapy: Secondary | ICD-10-CM | POA: Insufficient documentation

## 2019-10-27 DIAGNOSIS — K219 Gastro-esophageal reflux disease without esophagitis: Secondary | ICD-10-CM | POA: Diagnosis not present

## 2019-10-27 DIAGNOSIS — R079 Chest pain, unspecified: Secondary | ICD-10-CM | POA: Diagnosis not present

## 2019-10-27 DIAGNOSIS — J45909 Unspecified asthma, uncomplicated: Secondary | ICD-10-CM | POA: Insufficient documentation

## 2019-10-27 LAB — CBC
HCT: 34.1 % — ABNORMAL LOW (ref 36.0–46.0)
Hemoglobin: 11.3 g/dL — ABNORMAL LOW (ref 12.0–15.0)
MCH: 26.6 pg (ref 26.0–34.0)
MCHC: 33.1 g/dL (ref 30.0–36.0)
MCV: 80.2 fL (ref 80.0–100.0)
Platelets: 212 10*3/uL (ref 150–400)
RBC: 4.25 MIL/uL (ref 3.87–5.11)
RDW: 12.9 % (ref 11.5–15.5)
WBC: 5 10*3/uL (ref 4.0–10.5)
nRBC: 0 % (ref 0.0–0.2)

## 2019-10-27 LAB — BASIC METABOLIC PANEL
Anion gap: 8 (ref 5–15)
BUN: 13 mg/dL (ref 6–20)
CO2: 22 mmol/L (ref 22–32)
Calcium: 9.2 mg/dL (ref 8.9–10.3)
Chloride: 107 mmol/L (ref 98–111)
Creatinine, Ser: 0.72 mg/dL (ref 0.44–1.00)
GFR calc Af Amer: >60 mL/min (ref >60)
GFR calc non Af Amer: >60 mL/min (ref >60)
Glucose, Bld: 131 mg/dL — ABNORMAL HIGH (ref 70–99)
Potassium: 3.1 mmol/L — ABNORMAL LOW (ref 3.5–5.1)
Sodium: 137 mmol/L (ref 135–145)

## 2019-10-27 LAB — POCT PREGNANCY, URINE: Preg Test, Ur: NEGATIVE

## 2019-10-27 LAB — TROPONIN I (HIGH SENSITIVITY): Troponin I (High Sensitivity): <2 ng/L (ref <18)

## 2019-10-27 MED ORDER — SODIUM CHLORIDE 0.9% FLUSH: 3.0000 mL | Freq: Once | INTRAVENOUS | Status: DC

## 2019-10-27 NOTE — ED Triage Notes (Signed)
Pt has had some "reflux" per pt for a couple of days but today it got worse with heaviness in the center of her chest causing burning and irrigation. Pt tearful and scared wanting to make sure it isn't anything bad. Pt states that her dad was dx with heart failure a few days ago and she does struggle with anxiety. Pt does not take any meds for her anxiety. Pt states that her hands started to go numb when she tried to go to sleep tonight. Pt also states that her throat is a little raw and sore.

## 2019-10-28 ENCOUNTER — Emergency Department
Admission: EM | Admit: 2019-10-28 | Discharge: 2019-10-28 | Disposition: A | Discharge status: Home / Self Care | Payer: BC Managed Care – PPO | Attending: Emergency Medicine | Admitting: Emergency Medicine

## 2019-10-28 DIAGNOSIS — F419 Anxiety disorder, unspecified: Secondary | ICD-10-CM

## 2019-10-28 DIAGNOSIS — R0789 Other chest pain: Secondary | ICD-10-CM

## 2019-10-28 DIAGNOSIS — K219 Gastro-esophageal reflux disease without esophagitis: Secondary | ICD-10-CM

## 2019-10-28 MED ORDER — OMEPRAZOLE MAGNESIUM 20 MG PO TBEC: 20.0000 mg | DELAYED_RELEASE_TABLET | Freq: Every day | ORAL | 1 refills | Status: DC

## 2019-10-28 MED ORDER — POTASSIUM CHLORIDE CRYS ER 20 MEQ PO TBCR
40.0000 meq | EXTENDED_RELEASE_TABLET | Freq: Once | ORAL | Status: AC
  Administered 2019-10-28: 40 meq via ORAL
  Filled 2019-10-28: qty 2

## 2019-10-28 NOTE — ED Provider Notes (Signed)
[facility] Emergency Department Provider Note  ____________________________________________   First MD Initiated Contact with Patient 10/28/19 0321     (approximate)  I have reviewed the triage vital signs and the nursing notes.   HISTORY  Chief Complaint Chest Pain and Anxiety    HPI Jasmine Gordon is a 33 y.o. female who is generally healthy except as described below and who presents for evaluation of some burning in her upper abdomen versus the lower part of her chest.  She says she has been struggling with some anxiety particularly recently and that her father had a recent diagnosis of heart failure which made her more concerned.  She has had a recent appointment with her primary care doctor and he is starting her on BuSpar as well as another medicine for anxiety and/or depression but she has not picked them up from the pharmacy yet.  She had some lab work done and she has a follow-up appointment with him in a couple of days.  Tonight she had some burning in her upper abdomen as well as some nausea and at least one episode of vomiting and she felt like it was extending into her chest.  She says that she started worrying that she might have something bad wrong with her so she came to the ED for further evaluation.  She feels better now even though she still has a little bit of burning in her epigastrium.  She takes nothing for acid reflux.  She says she noticed that is similar symptoms were worse recently when she ate a honey bun right before bed and then lay down immediately.  She has no history of hypertension or diabetes and does not smoke.  She said that her cholesterol may be a little bit high and she is talking with her doctor about that.  No prior cardiac history personally.  Her father was recently diagnosed with heart failure.  She recently found out that she was vitamin D deficient and started back on a vitamin D supplement.  Nothing particular makes  her symptoms better or worse.  She has had no shortness of breath.  She denies fever/chills and has no persistent nausea.  No abdominal pain or dysuria.  Symptoms are relatively acute in onset and severe but have resolved.        Past Medical History:  Diagnosis Date   Allergy    Anemia    Asthma    Migraine    Panic attack    PTSD (post-traumatic stress disorder) 2016    Patient Active Problem List   Diagnosis Date Noted   Genital warts 09/11/2018   Positive TB test 09/25/2017   Allergic contact dermatitis due to metals 05/17/2017   Pre-diabetes 03/06/2017   Hyperlipidemia 03/06/2017   Allergic reaction 02/08/2017   Urticaria due to food allergy 02/08/2017   Vitamin D deficiency 11/08/2016   Iron deficiency anemia due to chronic blood loss 10/03/2016   Menorrhagia with regular cycle 06/27/2016   Mild persistent asthma 06/17/2016   Anxiety and depression 06/17/2016   Morbid obesity with BMI of 40.0-44.9, adult ([place]) 06/17/2016    Past Surgical History:  Procedure Laterality Date   APPENDECTOMY     Plantar warts      Prior to Admission medications   Medication Sig Start Date End Date Taking? Authorizing Provider  albuterol (PROVENTIL) (2.5 MG/3ML) 0.083% nebulizer solution Take 3 mLs (2.5 mg total) by nebulization every 6 (six) hours as needed for wheezing  or shortness of breath. 11/06/18   Karamalegos, Devonne Doughty, DO  albuterol (VENTOLIN HFA) 108 (90 Base) MCG/ACT inhaler Inhale 2 puffs into the lungs every 6 (six) hours as needed for wheezing or shortness of breath. 09/18/19   Karamalegos, Devonne Doughty, DO  buPROPion (WELLBUTRIN SR) 150 MG 12 hr tablet Take 1 tablet (150 mg total) by mouth 2 (two) times daily. 10/25/19   Karamalegos, Devonne Doughty, DO  busPIRone (BUSPAR) 5 MG tablet Take 1 tablet (5 mg total) by mouth 2 (two) times daily as needed (anxiety panic). 10/25/19   Karamalegos, Devonne Doughty, DO  dicyclomine (BENTYL) 10 MG capsule Take 1 capsule  (10 mg total) by mouth 4 (four) times daily -  before meals and at bedtime. As needed for abdominal cramping bloating Patient not taking: Reported on 10/25/2019 07/17/19   [name], DO  famotidine (PEPCID) 20 MG tablet Take 1 tablet (20 mg total) by mouth daily. 07/20/19 07/19/20  Schuyler Amor, MD  fluticasone (FLONASE) 50 MCG/ACT nasal spray Place 1 spray into both nostrils daily. 08/20/18 08/20/19  Lannie Fields, PA-C  nortriptyline (PAMELOR) 10 MG capsule Take 1 pill at night for one week then increase to 2 pills at night 10/10/19   [provider]  omeprazole (PRILOSEC OTC) 20 MG tablet Take 1 tablet (20 mg total) by mouth daily. 10/28/19 10/27/20  Hinda Kehr, MD  ondansetron (ZOFRAN) 4 MG tablet Take 1 tablet (4 mg total) by mouth every 8 (eight) hours as needed. 09/24/19   Nance Pear, MD  Respiratory Therapy Supplies (NEBULIZER/ADULT MASK) KIT Use nebulizer mask and supplies kit for nebulizer machine as needed Patient not taking: Reported on 10/25/2019 03/18/19   [name], DO  sucralfate (CARAFATE) 1 GM/10ML suspension Take 10 mLs (1 g total) by mouth 4 (four) times daily. 07/20/19 07/19/20  Schuyler Amor, MD    Allergies Reglan [metoclopramide]  Family History  Problem Relation Age of Onset   Depression Mother    Mental retardation Mother    Bipolar disorder Mother    Diabetes Father    Stroke Maternal Grandfather    Diabetes Maternal Grandfather     Social History Social History   Tobacco Use   Smoking status: Never Smoker   Smokeless tobacco: Never Used  Substance Use Topics   Alcohol use: No   Drug use: No    Review of Systems Constitutional: No fever/chills Eyes: No visual changes. ENT: No sore throat. Cardiovascular: Chest pain versus upper abdominal pain, feels like a burning sensation. Respiratory: Denies shortness of breath. Gastrointestinal: Burning in the upper abdomen with some nausea and vomiting.  No  diarrhea.  No constipation. Genitourinary: Negative for dysuria. Musculoskeletal: Negative for neck pain.  Negative for back pain. Integumentary: Negative for rash. Neurological: Negative for headaches, focal weakness or numbness. Psych: Anxiety, some depression.  No suicidal ideation.  ____________________________________________   PHYSICAL EXAM:  VITAL SIGNS: ED Triage Vitals [10/27/19 2209]  Enc Vitals Group     BP 128/73     Pulse Rate 99     Resp 18     Temp 98.4 F (36.9 C)     Temp Source Oral     SpO2 100 %     Weight 117 kg (257 lb 15 oz)     Height 1.676 m (5' 6")     Head Circumference      Peak Flow      Pain Score 7     Pain Loc  Pain Edu?      Excl. in [place]?     Constitutional: Alert and oriented.  Calm and cooperative at this time. Eyes: Conjunctivae are normal.  Head: Atraumatic. Nose: No congestion/rhinnorhea. Mouth/Throat: Patient is wearing a mask. Neck: No stridor.  No meningeal signs.   Cardiovascular: Normal rate, regular rhythm. Good peripheral circulation. Grossly normal heart sounds. Respiratory: Normal respiratory effort.  No retractions. Gastrointestinal: Soft and nontender. No distention.  Negative Murphy sign. Musculoskeletal: No lower extremity tenderness nor edema. No gross deformities of extremities. Neurologic:  Normal speech and language. No gross focal neurologic deficits are appreciated.  Skin:  Skin is warm, dry and intact. Psychiatric: Mood and affect are a little bit sad but appropriate under the circumstances.  No concern for emergent psychiatric condition such as suicidal ideation or severe depression.  ____________________________________________   LABS (all labs ordered are listed, but only abnormal results are displayed)  Labs Reviewed  BASIC METABOLIC PANEL - Abnormal; Notable for the following components:      Result Value   Potassium 3.1 (*)    Glucose, Bld 131 (*)    All other components within normal limits    CBC - Abnormal; Notable for the following components:   Hemoglobin 11.3 (*)    HCT 34.1 (*)    All other components within normal limits  POCT PREGNANCY, URINE  POC URINE PREG, ED  TROPONIN I (HIGH SENSITIVITY)  TROPONIN I (HIGH SENSITIVITY)   ____________________________________________  EKG  ED ECG REPORT I, Hinda Kehr, the attending physician, personally viewed and interpreted this ECG.  Date: 10/27/2019 EKG Time: 22: 04 Rate: 102 Rhythm: Borderline sinus tachycardia QRS Axis: normal Intervals: normal ST/T Wave abnormalities: Patient has some inverted T waves throughout but no ST segment depression or elevation and no significant change on EKG compared to prior EKG taken about a month ago.   Narrative Interpretation: no definitive evidence of acute ischemia; does not meet STEMI criteria.   ____________________________________________  RADIOLOGY I, Hinda Kehr, personally viewed and evaluated these images (plain radiographs) as part of my medical decision making, as well as reviewing the written report by the radiologist.  ED MD interpretation: No indication of acute abnormality on chest x-ray  Official radiology report(s): Dg Chest 2 View  Result Date: 10/27/2019 CLINICAL DATA:  Chest pain. EXAM: CHEST - 2 VIEW COMPARISON:  08/03/2019 FINDINGS: The heart size and mediastinal contours are within normal limits. Both lungs are clear. The visualized skeletal structures are unremarkable. IMPRESSION: No active cardiopulmonary disease. Electronically Signed   By: Constance Holster M.D.   On: 10/27/2019 22:46    ____________________________________________   PROCEDURES   Procedure(s) performed (including Critical Care):  Procedures   ____________________________________________   INITIAL IMPRESSION / MDM / [place] / ED COURSE  As part of my medical decision making, I reviewed the following data within the [place] notes  reviewed and incorporated, EKG interpreted , Old EKG reviewed, Radiograph reviewed  and Notes from prior ED visits   Differential diagnosis includes, but is not limited to, anxiety/panic attack, acid reflux, ACS, PE, pneumonia, biliary colic.  Patient is well-appearing and in no distress, calm and cooperative, and feels better than when she came in.  She has been in the ED for 6 hours.  She is very low risk for ACS based on HEAR score and given the symptoms have been occurring intermittently for the last couple of days I do not think she would benefit from a  second troponin.  Labs are otherwise reassuring except for some mild hypokalemia which could be secondary to dietary habits and a couple of episodes of vomiting recently.  I am providing a 40 mEq oral potassium supplement.  She has an appointment scheduled with her primary care doctor within the next few days and I encouraged her to keep that appointment and discuss her symptoms with him.  I also recommended that she consider taking an over-the-counter medication such as Prilosec to help with what sounds like acid reflux symptoms.  I provided reassurance and I gave my usual customary return precautions and she understands and agrees with the plan.  No evidence of emergent medical condition tonight.          ____________________________________________  FINAL CLINICAL IMPRESSION(S) / ED DIAGNOSES  Final diagnoses:  Atypical chest pain  Anxiety  Gastroesophageal reflux disease, unspecified whether esophagitis present     MEDICATIONS GIVEN DURING THIS VISIT:  Medications  sodium chloride flush (NS) 0.9 % injection 3 mL (has no administration in time range)  potassium chloride SA (KLOR-CON) CR tablet 40 mEq (has no administration in time range)     ED Discharge Orders         Ordered    omeprazole (PRILOSEC OTC) 20 MG tablet  Daily     10/28/19 0356          *Please note:  Jasmine Gordon was evaluated in Emergency  Department on 10/28/2019 for the symptoms described in the history of present illness. She was evaluated in the context of the global COVID-19 pandemic, which necessitated consideration that the patient might be at risk for infection with the SARS-CoV-2 virus that causes COVID-19. Institutional protocols and algorithms that pertain to the evaluation of patients at risk for COVID-19 are in a state of rapid change based on information released by regulatory bodies including the CDC and federal and state organizations. These policies and algorithms were followed during the patient's care in the ED.  Some ED evaluations and interventions may be delayed as a result of limited staffing during the pandemic.*  Note:  This document was prepared using Dragon voice recognition software and may include unintentional dictation errors.   Hinda Kehr, MD 10/28/19 [phone]

## 2019-10-28 NOTE — ED Notes (Signed)
Patient states she has been having some chest tightness the last few days but had been getting better.

## 2019-10-28 NOTE — ED Notes (Signed)
Patient updated.

## 2019-10-28 NOTE — Discharge Instructions (Signed)
You have been seen in the Emergency Department (ED) today for chest pain.  As we have discussed todays test results are normal, and we feel it is likely that panic attacks may be causing your symptoms.  They are also likely caused (or contributed to) by acid reflux.  Please follow up with the recommended doctor as instructed above in these documents regarding todays emergent visit and your recent symptoms to discuss further management.  Continue to take your regular medications. Please consider also trying over-the-counter Prilosec or similar antacid medication.  Return to the Emergency Department (ED) if you experience any further chest pain/pressure/tightness, difficulty breathing, or sudden sweating, or other symptoms that concern you.

## 2019-10-29 ENCOUNTER — Encounter: Payer: Self-pay | Admitting: Oncology

## 2019-10-30 ENCOUNTER — Other Ambulatory Visit: Payer: Self-pay

## 2019-10-30 ENCOUNTER — Ambulatory Visit (INDEPENDENT_AMBULATORY_CARE_PROVIDER_SITE_OTHER): Payer: BC Managed Care – PPO | Admitting: Family Medicine

## 2019-10-30 ENCOUNTER — Encounter: Payer: Self-pay | Admitting: Family Medicine

## 2019-10-30 VITALS — BP 127/88 | HR 109 | Temp 97.8°F | Ht 66.0 in | Wt 259.0 lb

## 2019-10-30 DIAGNOSIS — Z0000 Encounter for general adult medical examination without abnormal findings: Secondary | ICD-10-CM

## 2019-10-30 DIAGNOSIS — F329 Major depressive disorder, single episode, unspecified: Secondary | ICD-10-CM

## 2019-10-30 DIAGNOSIS — F32A Depression, unspecified: Secondary | ICD-10-CM

## 2019-10-30 DIAGNOSIS — F419 Anxiety disorder, unspecified: Secondary | ICD-10-CM

## 2019-10-30 NOTE — Patient Instructions (Addendum)
Take vitamin D3 2,000 one dose twice a day Diet keep improving to avoid high cholesterol foods as discussed Take multivitamin daily or meal substitute shake Use bupriopion (wellbutrin) if you need for regular daily anxiety symptoms - and buspar for episodic as needed anxiety - if you need can ask questions on these in future  Please schedule a Follow-up Appointment to: Return in about 3 months (around 01/28/2020) for 3 month anxiety, iron, potassium/vitamin d labs.  If you have any other questions or concerns, please feel free to call the office or send a message through [place]. You may also schedule an earlier appointment if necessary.  Additionally, you may be receiving a survey about your experience at our office within a few days to 1 week by e-mail or mail. We value your feedback.  Nobie Putnam, DO [place]

## 2019-10-30 NOTE — Progress Notes (Signed)
Virtual Visit via Telephone The purpose of this virtual visit is to provide medical care while limiting exposure to the novel coronavirus (COVID19) for both patient and office staff.  Consent was obtained for phone visit:  Yes.   Answered questions that patient had about telehealth interaction:  Yes.   I discussed the limitations, risks, security and privacy concerns of performing an evaluation and management service by telephone. I also discussed with the patient that there may be a patient responsible charge related to this service. The patient expressed understanding and agreed to proceed.  Patient Location: Home Provider Location: [facility])  ---------------------------------------------------------------------- Chief Complaint  Patient presents with  . Annual Exam    S: Reviewed CMA documentation. I have called patient and gathered additional HPI as follows:  Hyperlipidemia Last lab 10/2019, LDL >150, HDL mild low, normal TG She admits diet higher in cholesterol foods, cheese - goal to improve diet  Vitamin D deficiency Previously If feeling better, stopped taking Vitamin D Previously more sun in summer. Now feeling similar symptoms again with low vitamin D She will restart her Vitamin D  Anxiety Last visit 10/25/19. She never started her anxiety medications Buproprion or Buspar. But now will pick up and use if needed. Previous episodes described anxiety panic with sweating, heart racing tachycardia Significant life stressors with family / parents illness diabetes, CHF  GERD Likely with some acid reflux gerd symptoms related to anxiety in past. She has tried OTC PPI.    Denies any high risk travel to areas of current concern for COVID19. Denies any known or suspected exposure to person with or possibly with COVID19.  Denies any fevers, chills, sweats, body ache, cough, shortness of breath, sinus pain or pressure, headache, abdominal pain,  diarrhea  Past Medical History:  Diagnosis Date  . Allergy   . Anemia   . Asthma   . Migraine   . Panic attack   . PTSD (post-traumatic stress disorder) 2016   Social History   Tobacco Use  . Smoking status: Never Smoker  . Smokeless tobacco: Never Used  Substance Use Topics  . Alcohol use: No  . Drug use: No    Current Outpatient Medications:  .  albuterol (PROVENTIL) (2.5 MG/3ML) 0.083% nebulizer solution, Take 3 mLs (2.5 mg total) by nebulization every 6 (six) hours as needed for wheezing or shortness of breath., Disp: 150 mL, Rfl: 1 .  albuterol (VENTOLIN HFA) 108 (90 Base) MCG/ACT inhaler, Inhale 2 puffs into the lungs every 6 (six) hours as needed for wheezing or shortness of breath., Disp: 8 g, Rfl: 2 .  Ergocalciferol (VITAMIN D2) 50 MCG (2000 UT) TABS, Take by mouth., Disp: , Rfl:  .  buPROPion (WELLBUTRIN SR) 150 MG 12 hr tablet, Take 1 tablet (150 mg total) by mouth 2 (two) times daily. (Patient not taking: Reported on 10/30/2019), Disp: 60 tablet, Rfl: 2 .  busPIRone (BUSPAR) 5 MG tablet, Take 1 tablet (5 mg total) by mouth 2 (two) times daily as needed (anxiety panic). (Patient not taking: Reported on 10/30/2019), Disp: 60 tablet, Rfl: 2 .  famotidine (PEPCID) 20 MG tablet, Take 1 tablet (20 mg total) by mouth daily. (Patient not taking: Reported on 10/30/2019), Disp: 30 tablet, Rfl: 1 .  fluticasone (FLONASE) 50 MCG/ACT nasal spray, Place 1 spray into both nostrils daily., Disp: 16 g, Rfl: 2 .  nortriptyline (PAMELOR) 10 MG capsule, Take 1 pill at night for one week then increase to 2 pills at night,  Disp: , Rfl:  .  omeprazole (PRILOSEC OTC) 20 MG tablet, Take 1 tablet (20 mg total) by mouth daily. (Patient not taking: Reported on 10/30/2019), Disp: 28 tablet, Rfl: 1 .  ondansetron (ZOFRAN) 4 MG tablet, Take 1 tablet (4 mg total) by mouth every 8 (eight) hours as needed. (Patient not taking: Reported on 10/30/2019), Disp: 20 tablet, Rfl: 0  Depression screen [facility] 2/9  10/25/2019 08/16/2019 05/28/2019  Decreased Interest 2 0 2  Down, Depressed, Hopeless 2 0 1  PHQ - 2 Score 4 0 3  Altered sleeping 0 1 2  Tired, decreased energy 2 0 3  Change in appetite 0 2 1  Feeling bad or failure about yourself  3 2 1   Trouble concentrating 2 0 1  Moving slowly or fidgety/restless 0 0 1  Suicidal thoughts 0 0 0  PHQ-9 Score 11 5 12   Difficult doing work/chores Very difficult Somewhat difficult Somewhat difficult    GAD 7 : Generalized Anxiety Score 10/25/2019 08/16/2019 05/28/2019 11/06/2018  Nervous, Anxious, on Edge 3 2 2  0  Control/stop worrying 3 1 1  0  Worry too much - different things 3 1 2  0  Trouble relaxing 2 1 3  0  Restless 3 0 2 0  Easily annoyed or irritable 2 1 1  0  Afraid - awful might happen 3 2 3  0  Total GAD 7 Score 19 8 14  0  Anxiety Difficulty Extremely difficult Somewhat difficult Somewhat difficult Not difficult at all    -------------------------------------------------------------------------- O: No physical exam performed due to remote telephone encounter.  BP 127/88   Pulse (!) 109 Comment: pt was actually exercising prior to checking bp  Temp 97.8 F (36.6 C) (Oral)   Ht 5\' 6"  (1.676 m)   Wt 259 lb (117.5 kg)   BMI 41.80 kg/m    Lab results reviewed.  Recent Results (from the past 2160 hour(s))  Lipase, blood     Status: None   Collection Time: 09/24/19  3:45 PM  Result Value Ref Range   Lipase 21 11 - 51 U/L    Comment: Performed at [facility], [address] [place], [place] [address]  Comprehensive metabolic panel     Status: Abnormal   Collection Time: 09/24/19  3:45 PM  Result Value Ref Range   Sodium 139 135 - 145 mmol/L   Potassium 3.4 (L) 3.5 - 5.1 mmol/L   Chloride 105 98 - 111 mmol/L   CO2 24 22 - 32 mmol/L   Glucose, Bld 103 (H) 70 - 99 mg/dL   BUN 10 6 - 20 mg/dL   Creatinine, Ser 0.82 0.44 - 1.00 mg/dL   Calcium 9.5 8.9 - 10.3 mg/dL   Total Protein 7.6 6.5 - 8.1 g/dL   Albumin 4.1 3.5 - 5.0  g/dL   AST 16 15 - 41 U/L   ALT 12 0 - 44 U/L   Alkaline Phosphatase 46 38 - 126 U/L   Total Bilirubin 0.4 0.3 - 1.2 mg/dL   GFR calc non Af Amer >60 >60 mL/min   GFR calc Af Amer >60 >60 mL/min   Anion gap 10 5 - 15    Comment: Performed at [facility], [address] [place], [place] [address]  CBC     Status: Abnormal   Collection Time: 09/24/19  3:45 PM  Result Value Ref Range   WBC 4.9 4.0 - 10.5 K/uL   RBC 4.34 3.87 - 5.11 MIL/uL   Hemoglobin 11.6 (L)  12.0 - 15.0 g/dL   HCT 13.2 44.0 - 10.2 %   MCV 83.6 80.0 - 100.0 fL   MCH 26.7 26.0 - 34.0 pg   MCHC 32.0 30.0 - 36.0 g/dL   RDW 72.5 36.6 - 44.0 %   Platelets 233 150 - 400 K/uL   nRBC 0.0 0.0 - 0.2 %    Comment: Performed at [facility], [address] [place], [place] [address]  Urinalysis, Complete w Microscopic     Status: Abnormal   Collection Time: 09/24/19  3:46 PM  Result Value Ref Range   Color, Urine YELLOW (A) YELLOW   APPearance CLEAR (A) CLEAR   Specific Gravity, Urine 1.020 1.005 - 1.030   pH 7.0 5.0 - 8.0   Glucose, UA NEGATIVE NEGATIVE mg/dL   Hgb urine dipstick LARGE (A) NEGATIVE   Bilirubin Urine NEGATIVE NEGATIVE   Ketones, ur NEGATIVE NEGATIVE mg/dL   Protein, ur 30 (A) NEGATIVE mg/dL   Nitrite NEGATIVE NEGATIVE   Leukocytes,Ua NEGATIVE NEGATIVE   RBC / HPF >50 (H) 0 - 5 RBC/hpf   WBC, UA 0-5 0 - 5 WBC/hpf   Bacteria, UA RARE (A) NONE SEEN   Squamous Epithelial / LPF 0-5 0 - 5   Mucus PRESENT     Comment: Performed at [facility], [address] [place], [place] [address]  Pregnancy, urine POC     Status: None   Collection Time: 09/24/19  3:48 PM  Result Value Ref Range   Preg Test, Ur NEGATIVE NEGATIVE    Comment:        THE SENSITIVITY OF THIS METHODOLOGY IS >24 mIU/mL   Novel Coronavirus, NAA (Hosp order, Send-out to Ref Lab; TAT 18-24 hrs     Status: None   Collection Time: 09/24/19  5:10 PM   Specimen: Nasopharyngeal Swab; Respiratory  Result Value  Ref Range   SARS-CoV-2, NAA NOT DETECTED NOT DETECTED    Comment: (NOTE) This nucleic acid amplification test was developed and its performance characteristics determined by [employer]. Nucleic acid amplification tests include PCR and TMA. This test has not been FDA cleared or approved. This test has been authorized by FDA under an Emergency Use Authorization (EUA). This test is only authorized for the duration of time the declaration that circumstances exist justifying the authorization of the emergency use of in vitro diagnostic tests for detection of SARS-CoV-2 virus and/or diagnosis of COVID-19 infection under section 564(b)(1) of the Act, 21 U.S.C. 875IEP-3(I) (1), unless the authorization is terminated or revoked sooner. When diagnostic testing is negative, the possibility of a false negative result should be considered in the context of a patient's recent exposures and the presence of clinical signs and symptoms consistent with COVID-19. An individual without symptoms of COVID- 19 and who is not shedding SARS-CoV-2 vi rus would expect to have a negative (not detected) result in this assay. Performed At: [facility] [address] [place] [address] [place], [place] [id number] Jolene Schimke MD AY:[id number]    Coronavirus Source NASOPHARYNGEAL     Comment: Performed at [facility], [address] [place], [place] [address]  T4, free     Status: None   Collection Time: 10/25/19  8:16 AM  Result Value Ref Range   Free T4 1.0 0.8 - 1.8 ng/dL  TSH     Status: None   Collection Time: 10/25/19  8:16 AM  Result Value Ref Range   TSH 2.81 mIU/L    Comment:  Reference Range .           > or = 20 Years  0.40-4.50 .                Pregnancy Ranges           First trimester    0.26-2.66           Second trimester   0.55-2.73           Third trimester    0.43-2.91   VITAMIN D 25 Hydroxy (Vit-D Deficiency, Fractures)     Status: Abnormal   Collection Time:  10/25/19  8:16 AM  Result Value Ref Range   Vit D, 25-Hydroxy 14 (L) 30 - 100 ng/mL    Comment: Vitamin D Status         25-OH Vitamin D: . Deficiency:                    <20 ng/mL Insufficiency:             20 - 29 ng/mL Optimal:                 > or = 30 ng/mL . For 25-OH Vitamin D testing on patients on  D2-supplementation and patients for whom quantitation  of D2 and D3 fractions is required, the QuestAssureD(TM) 25-OH VIT D, (D2,D3), LC/MS/MS is recommended: order  code 16109 (patients >58yrs). See Note 1 . Note 1 . For additional information, please refer to  http://education.Quest[facility].com/faq/FAQ199  (This link is being provided for informational/ educational purposes only.)   Vitamin B12     Status: None   Collection Time: 10/25/19  8:16 AM  Result Value Ref Range   Vitamin B-12 388 200 - 1,100 pg/mL    Comment: . Please Note: Although the reference range for vitamin B12 is [phone] pg/mL, it has been reported that between 5 and 10% of patients with values between 200 and 400 pg/mL may experience neuropsychiatric and hematologic abnormalities due to occult B12 deficiency; less than 1% of patients with values above 400 pg/mL will have symptoms. .   Lipid panel     Status: Abnormal   Collection Time: 10/25/19  8:16 AM  Result Value Ref Range   Cholesterol 225 (H) <200 mg/dL   HDL 47 (L) > OR = 50 mg/dL   Triglycerides 98 <604 mg/dL   LDL Cholesterol (Calc) 157 (H) mg/dL (calc)    Comment: Reference range: <100 . Desirable range <100 mg/dL for primary prevention;   <70 mg/dL for patients with CHD or diabetic patients  with > or = 2 CHD risk factors. Marland Kitchen LDL-C is now calculated using the Martin-Hopkins  calculation, which is a validated novel method providing  better accuracy than the Friedewald equation in the  estimation of LDL-C.  Horald Pollen et al. Lenox Ahr. 5409;811(91): 2061-2068  (http://education.[facility].com/faq/FAQ164)    Total CHOL/HDL  Ratio 4.8 <5.0 (calc)   Non-HDL Cholesterol (Calc) 178 (H) <130 mg/dL (calc)    Comment: For patients with diabetes plus 1 major ASCVD risk  factor, treating to a non-HDL-C goal of <100 mg/dL  (LDL-C of <47 mg/dL) is considered a therapeutic  option.   COMPLETE METABOLIC PANEL WITH GFR     Status: None   Collection Time: 10/25/19  8:16 AM  Result Value Ref Range   Glucose, Bld 92 65 - 99 mg/dL    Comment: .            Fasting reference interval .  BUN 13 7 - 25 mg/dL   Creat 1.61 0.96 - 0.45 mg/dL   GFR, Est Non African American 100 > OR = 60 mL/min/1.34m2   GFR, Est African American 116 > OR = 60 mL/min/1.29m2   BUN/Creatinine Ratio NOT APPLICABLE 6 - 22 (calc)   Sodium 139 135 - 146 mmol/L   Potassium 3.8 3.5 - 5.3 mmol/L   Chloride 107 98 - 110 mmol/L   CO2 23 20 - 32 mmol/L   Calcium 9.4 8.6 - 10.2 mg/dL   Total Protein 7.0 6.1 - 8.1 g/dL   Albumin 4.4 3.6 - 5.1 g/dL   Globulin 2.6 1.9 - 3.7 g/dL (calc)   AG Ratio 1.7 1.0 - 2.5 (calc)   Total Bilirubin 0.4 0.2 - 1.2 mg/dL   Alkaline phosphatase (APISO) 45 31 - 125 U/L   AST 15 10 - 30 U/L   ALT 9 6 - 29 U/L  CBC with Differential/Platelet     Status: Abnormal   Collection Time: 10/25/19  8:16 AM  Result Value Ref Range   WBC 3.9 3.8 - 10.8 Thousand/uL   RBC 4.50 3.80 - 5.10 Million/uL   Hemoglobin 12.0 11.7 - 15.5 g/dL   HCT 40.9 81.1 - 91.4 %   MCV 84.2 80.0 - 100.0 fL   MCH 26.7 (L) 27.0 - 33.0 pg   MCHC 31.7 (L) 32.0 - 36.0 g/dL   RDW 78.2 95.6 - 21.3 %   Platelets 233 140 - 400 Thousand/uL   MPV 11.0 7.5 - 12.5 fL   Neutro Abs 1,856 1,500 - 7,800 cells/uL   Lymphs Abs 1,775 850 - 3,900 cells/uL   Absolute Monocytes 199 (L) 200 - 950 cells/uL   Eosinophils Absolute 51 15 - 500 cells/uL   Basophils Absolute 20 0 - 200 cells/uL   Neutrophils Relative % 47.6 %   Total Lymphocyte 45.5 %   Monocytes Relative 5.1 %   Eosinophils Relative 1.3 %   Basophils Relative 0.5 %  Hemoglobin A1c     Status: Abnormal    Collection Time: 10/25/19  8:16 AM  Result Value Ref Range   Hgb A1c MFr Bld 5.7 (H) <5.7 % of total Hgb    Comment: For someone without known diabetes, a hemoglobin  A1c value between 5.7% and 6.4% is consistent with prediabetes and should be confirmed with a  follow-up test. . For someone with known diabetes, a value <7% indicates that their diabetes is well controlled. A1c targets should be individualized based on duration of diabetes, age, comorbid conditions, and other considerations. . This assay result is consistent with an increased risk of diabetes. . Currently, no consensus exists regarding use of hemoglobin A1c for diagnosis of diabetes for children. .    Mean Plasma Glucose 117 (calc)   eAG (mmol/L) 6.5 (calc)  Basic metabolic panel     Status: Abnormal   Collection Time: 10/27/19 10:11 PM  Result Value Ref Range   Sodium 137 135 - 145 mmol/L   Potassium 3.1 (L) 3.5 - 5.1 mmol/L   Chloride 107 98 - 111 mmol/L   CO2 22 22 - 32 mmol/L   Glucose, Bld 131 (H) 70 - 99 mg/dL   BUN 13 6 - 20 mg/dL   Creatinine, Ser 0.86 0.44 - 1.00 mg/dL   Calcium 9.2 8.9 - 57.8 mg/dL   GFR calc non Af Amer >60 >60 mL/min   GFR calc Af Amer >60 >60 mL/min   Anion gap 8 5 - 15  Comment: Performed at [facility], [address] [place], [place] [address]  CBC     Status: Abnormal   Collection Time: 10/27/19 10:11 PM  Result Value Ref Range   WBC 5.0 4.0 - 10.5 K/uL   RBC 4.25 3.87 - 5.11 MIL/uL   Hemoglobin 11.3 (L) 12.0 - 15.0 g/dL   HCT 47.834.1 (L) 29.536.0 - 62.146.0 %   MCV 80.2 80.0 - 100.0 fL   MCH 26.6 26.0 - 34.0 pg   MCHC 33.1 30.0 - 36.0 g/dL   RDW 30.812.9 65.711.5 - 84.615.5 %   Platelets 212 150 - 400 K/uL   nRBC 0.0 0.0 - 0.2 %    Comment: Performed at [facility], [address] [place], [place] [address]  Troponin I (High Sensitivity)     Status: None   Collection Time: 10/27/19 10:11 PM  Result Value Ref Range   Troponin I (High Sensitivity) <2 <18 ng/L     Comment: (NOTE) Elevated high sensitivity troponin I (hsTnI) values and significant  changes across serial measurements may suggest ACS but many other  chronic and acute conditions are known to elevate hsTnI results.  Refer to the "Links" section for chest pain algorithms and additional  guidance. Performed at [facility], [address] [place], [place] [address]   Pregnancy, urine POC     Status: None   Collection Time: 10/27/19 10:25 PM  Result Value Ref Range   Preg Test, Ur NEGATIVE NEGATIVE    Comment:        THE SENSITIVITY OF THIS METHODOLOGY IS >24 mIU/mL     -------------------------------------------------------------------------- A&P:  Problem List Items Addressed This Visit    Anxiety and depression    Other Visit Diagnoses    Annual physical exam    -  Primary     Updated Health Maintenance information Reviewed recent lab results with patient Encouraged improvement to lifestyle with diet and exercise - Goal of weight loss  #Anxiety, with panic Advised to pick up the rx from last apt, Buproprion, Buspar, use as prescribed to help control her anxiety that seems to trigger panic attacks and constellation of other symptoms.  #VItamin D Def Restart vitamin D as advised    No orders of the defined types were placed in this encounter.   Follow-up: - Return in 3 months anxiety  Patient verbalizes understanding with the above medical recommendations including the limitation of remote medical advice.  Specific follow-up and call-back criteria were given for patient to follow-up or seek medical care more urgently if needed.   - Time spent in direct consultation with patient on phone: 15 minutes   Saralyn PilarAlexander Artin Mceuen, DO [facility] 10/30/2019, 4:31 PM

## 2019-11-01 ENCOUNTER — Encounter: Payer: Self-pay | Admitting: Family Medicine

## 2019-11-01 ENCOUNTER — Encounter: Payer: BC Managed Care – PPO | Admitting: Family Medicine

## 2019-11-20 ENCOUNTER — Inpatient Hospital Stay
Admission: RE | Admit: 2019-11-20 | Discharge: 2019-11-20 | Disposition: A | Discharge status: Home / Self Care | Payer: BC Managed Care – PPO | Source: Ambulatory Visit

## 2019-11-26 ENCOUNTER — Other Ambulatory Visit (HOSPITAL_COMMUNITY)
Admission: RE | Admit: 2019-11-26 | Discharge: 2019-11-26 | Disposition: A | Discharge status: Home / Self Care | Payer: BC Managed Care – PPO | Source: Ambulatory Visit | Attending: Obstetrics & Gynecology | Admitting: Obstetrics & Gynecology

## 2019-11-26 ENCOUNTER — Other Ambulatory Visit: Payer: Self-pay

## 2019-11-26 ENCOUNTER — Encounter: Payer: Self-pay | Admitting: Obstetrics & Gynecology

## 2019-11-26 ENCOUNTER — Ambulatory Visit (INDEPENDENT_AMBULATORY_CARE_PROVIDER_SITE_OTHER): Payer: Medicaid Other | Admitting: Obstetrics & Gynecology

## 2019-11-26 VITALS — BP 110/70 | Ht 66.0 in | Wt 257.0 lb

## 2019-11-26 DIAGNOSIS — Z01419 Encounter for gynecological examination (general) (routine) without abnormal findings: Secondary | ICD-10-CM

## 2019-11-26 DIAGNOSIS — Z0000 Encounter for general adult medical examination without abnormal findings: Secondary | ICD-10-CM | POA: Diagnosis not present

## 2019-11-26 DIAGNOSIS — Z124 Encounter for screening for malignant neoplasm of cervix: Secondary | ICD-10-CM | POA: Insufficient documentation

## 2019-11-26 NOTE — Progress Notes (Signed)
HPI:      [name] is a 34 y.o. [phone] who LMP was Patient's last menstrual period was 11/14/2019., she presents today for her annual examination. The patient has no complaints today.  Her recent tx w TCA of genital warts helped, did not use Aldara (says she is very sensitive to that medicine).  The patient is sexually active. Her last pap: approximate date 2019 and was normal. The patient does perform self breast exams.  There is no notable family history of breast or ovarian cancer in her family.  The patient has regular exercise: yes.  The patient denies current symptoms of depression.    GYN History: Contraception: none  PMHx: Past Medical History:  Diagnosis Date  . Allergy   . Anemia   . Asthma   . Migraine   . Panic attack   . PTSD (post-traumatic stress disorder) 2016   Past Surgical History:  Procedure Laterality Date  . APPENDECTOMY    . Plantar warts     Family History  Problem Relation Age of Onset  . Depression Mother   . Mental retardation Mother   . Bipolar disorder Mother   . Diabetes Father   . Stroke Maternal Grandfather   . Diabetes Maternal Grandfather    Social History   Tobacco Use  . Smoking status: Never Smoker  . Smokeless tobacco: Never Used  Substance Use Topics  . Alcohol use: No  . Drug use: No    Current Outpatient Medications:  .  albuterol (PROVENTIL) (2.5 MG/3ML) 0.083% nebulizer solution, Take 3 mLs (2.5 mg total) by nebulization every 6 (six) hours as needed for wheezing or shortness of breath. (Patient not taking: Reported on 11/26/2019), Disp: 150 mL, Rfl: 1 .  albuterol (VENTOLIN HFA) 108 (90 Base) MCG/ACT inhaler, Inhale 2 puffs into the lungs every 6 (six) hours as needed for wheezing or shortness of breath. (Patient not taking: Reported on 11/26/2019), Disp: 8 g, Rfl: 2 .  buPROPion (WELLBUTRIN SR) 150 MG 12 hr tablet, Take 1 tablet (150 mg total) by mouth 2 (two) times daily. (Patient not taking: Reported on  10/30/2019), Disp: 60 tablet, Rfl: 2 .  busPIRone (BUSPAR) 5 MG tablet, Take 1 tablet (5 mg total) by mouth 2 (two) times daily as needed (anxiety panic). (Patient not taking: Reported on 10/30/2019), Disp: 60 tablet, Rfl: 2 .  Ergocalciferol (VITAMIN D2) 50 MCG (2000 UT) TABS, Take by mouth., Disp: , Rfl:  .  famotidine (PEPCID) 20 MG tablet, Take 1 tablet (20 mg total) by mouth daily. (Patient not taking: Reported on 10/30/2019), Disp: 30 tablet, Rfl: 1 .  fluticasone (FLONASE) 50 MCG/ACT nasal spray, Place 1 spray into both nostrils daily., Disp: 16 g, Rfl: 2 .  nortriptyline (PAMELOR) 10 MG capsule, Take 1 pill at night for one week then increase to 2 pills at night, Disp: , Rfl:  .  omeprazole (PRILOSEC OTC) 20 MG tablet, Take 1 tablet (20 mg total) by mouth daily. (Patient not taking: Reported on 10/30/2019), Disp: 28 tablet, Rfl: 1 .  ondansetron (ZOFRAN) 4 MG tablet, Take 1 tablet (4 mg total) by mouth every 8 (eight) hours as needed. (Patient not taking: Reported on 10/30/2019), Disp: 20 tablet, Rfl: 0 Allergies: Reglan [metoclopramide]  Review of Systems  Constitutional: Negative for chills, fever and malaise/fatigue.  HENT: Negative for congestion, sinus pain and sore throat.   Eyes: Negative for blurred vision and pain.  Respiratory: Negative for cough and wheezing.  Cardiovascular: Negative for chest pain and leg swelling.  Gastrointestinal: Negative for abdominal pain, constipation, diarrhea, heartburn, nausea and vomiting.  Genitourinary: Negative for dysuria, frequency, hematuria and urgency.  Musculoskeletal: Negative for back pain, joint pain, myalgias and neck pain.  Skin: Negative for itching and rash.  Neurological: Negative for dizziness, tremors and weakness.  Endo/Heme/Allergies: Does not bruise/bleed easily.  Psychiatric/Behavioral: Negative for depression. The patient is not nervous/anxious and does not have insomnia.     Objective: BP 110/70   Ht 5\' 6"  (1.676 m)    Wt 257 lb (116.6 kg)   LMP 11/14/2019   BMI 41.48 kg/m   Filed Weights   11/26/19 0830  Weight: 257 lb (116.6 kg)   Body mass index is 41.48 kg/m. Physical Exam Constitutional:      General: She is not in acute distress.    Appearance: She is well-developed. She is obese.  Genitourinary:     Pelvic exam was performed with patient supine.     Vagina, uterus and rectum normal.     No lesions in the vagina.     No vaginal bleeding.     No cervical motion tenderness, friability, lesion or polyp.     Uterus is mobile.     Uterus is not enlarged.     No uterine mass detected.    Uterus is midaxial.     No right or left adnexal mass present.     Right adnexa not tender.     Left adnexa not tender.  HENT:     Head: Normocephalic and atraumatic. No laceration.     Right Ear: Hearing normal.     Left Ear: Hearing normal.     Mouth/Throat:     Pharynx: Uvula midline.  Eyes:     Pupils: Pupils are equal, round, and reactive to light.  Neck:     Thyroid: No thyromegaly.  Cardiovascular:     Rate and Rhythm: Normal rate and regular rhythm.     Heart sounds: No murmur. No friction rub. No gallop.   Pulmonary:     Effort: Pulmonary effort is normal. No respiratory distress.     Breath sounds: Normal breath sounds. No wheezing.  Chest:     Breasts:        Right: No mass, skin change or tenderness.        Left: No mass, skin change or tenderness.  Abdominal:     General: Bowel sounds are normal. There is no distension.     Palpations: Abdomen is soft.     Tenderness: There is no abdominal tenderness. There is no rebound.  Musculoskeletal:        General: Normal range of motion.     Cervical back: Normal range of motion and neck supple.  Neurological:     Mental Status: She is alert and oriented to person, place, and time.     Cranial Nerves: No cranial nerve deficit.  Skin:    General: Skin is warm and dry.  Psychiatric:        Judgment: Judgment normal.  Vitals reviewed.      Assessment:  ANNUAL EXAM 1. Women's annual routine gynecological examination   2. Screening for cervical cancer      Screening Plan:            1.  Cervical Screening-  Pap smear done today  2. Breast screening- Exam annually and mammogram>40 planned   3. Labs managed by PCP  4. Counseling for  contraception: no method  Other:  Genital warts, in remission at this time Counseled on what to watch for    F/U  Return in about 1 year (around 11/25/2020) for Annual.  Barnett Applebaum, MD, [facility], [facility] 11/26/2019  9:01 AM

## 2019-11-26 NOTE — Patient Instructions (Addendum)
PAP every year Labs yearly (with PCP) 

## 2019-11-27 ENCOUNTER — Inpatient Hospital Stay: Payer: BC Managed Care – PPO | Attending: Oncology

## 2019-11-27 ENCOUNTER — Other Ambulatory Visit: Payer: Self-pay

## 2019-11-27 ENCOUNTER — Inpatient Hospital Stay: Payer: BC Managed Care – PPO

## 2019-11-27 DIAGNOSIS — Z79899 Other long term (current) drug therapy: Secondary | ICD-10-CM | POA: Insufficient documentation

## 2019-11-27 DIAGNOSIS — J45909 Unspecified asthma, uncomplicated: Secondary | ICD-10-CM | POA: Diagnosis not present

## 2019-11-27 DIAGNOSIS — D500 Iron deficiency anemia secondary to blood loss (chronic): Secondary | ICD-10-CM | POA: Insufficient documentation

## 2019-11-27 LAB — CBC WITH DIFFERENTIAL/PLATELET
Abs Immature Granulocytes: 0.01 10*3/uL (ref 0.00–0.07)
Basophils Absolute: 0 10*3/uL (ref 0.0–0.1)
Basophils Relative: 1 %
Eosinophils Absolute: 0.1 10*3/uL (ref 0.0–0.5)
Eosinophils Relative: 1 %
HCT: 35.9 % — ABNORMAL LOW (ref 36.0–46.0)
Hemoglobin: 11.2 g/dL — ABNORMAL LOW (ref 12.0–15.0)
Immature Granulocytes: 0 %
Lymphocytes Relative: 46 %
Lymphs Abs: 2.3 10*3/uL (ref 0.7–4.0)
MCH: 26.6 pg (ref 26.0–34.0)
MCHC: 31.2 g/dL (ref 30.0–36.0)
MCV: 85.3 fL (ref 80.0–100.0)
Monocytes Absolute: 0.3 10*3/uL (ref 0.1–1.0)
Monocytes Relative: 7 %
Neutro Abs: 2.2 10*3/uL (ref 1.7–7.7)
Neutrophils Relative %: 45 %
Platelets: 223 10*3/uL (ref 150–400)
RBC: 4.21 MIL/uL (ref 3.87–5.11)
RDW: 12.7 % (ref 11.5–15.5)
WBC: 5 10*3/uL (ref 4.0–10.5)
nRBC: 0 % (ref 0.0–0.2)

## 2019-11-27 LAB — IRON AND TIBC
Iron: 51 ug/dL (ref 28–170)
Saturation Ratios: 17 % (ref 10.4–31.8)
TIBC: 297 ug/dL (ref 250–450)
UIBC: 246 ug/dL

## 2019-11-27 LAB — FERRITIN: Ferritin: 38 ng/mL (ref 11–307)

## 2019-11-29 ENCOUNTER — Inpatient Hospital Stay (HOSPITAL_BASED_OUTPATIENT_CLINIC_OR_DEPARTMENT_OTHER): Payer: BC Managed Care – PPO | Admitting: Oncology

## 2019-11-29 ENCOUNTER — Inpatient Hospital Stay: Payer: BC Managed Care – PPO

## 2019-11-29 ENCOUNTER — Encounter: Payer: Self-pay | Admitting: Oncology

## 2019-11-29 DIAGNOSIS — D500 Iron deficiency anemia secondary to blood loss (chronic): Secondary | ICD-10-CM | POA: Diagnosis not present

## 2019-11-29 DIAGNOSIS — J4530 Mild persistent asthma, uncomplicated: Secondary | ICD-10-CM

## 2019-11-29 DIAGNOSIS — J45909 Unspecified asthma, uncomplicated: Secondary | ICD-10-CM | POA: Diagnosis not present

## 2019-11-29 DIAGNOSIS — Z79899 Other long term (current) drug therapy: Secondary | ICD-10-CM | POA: Diagnosis not present

## 2019-11-29 LAB — CYTOLOGY - PAP
Comment: NEGATIVE
Diagnosis: NEGATIVE
High risk HPV: POSITIVE — AB

## 2019-11-29 MED ORDER — ALBUTEROL SULFATE HFA 108 (90 BASE) MCG/ACT IN AERS: 2.0000 | INHALATION_SPRAY | Freq: Four times a day (QID) | RESPIRATORY_TRACT | 3 refills | Status: DC | PRN

## 2019-11-29 NOTE — Progress Notes (Signed)
HEMATOLOGY-ONCOLOGY TeleHEALTH VISIT PROGRESS NOTE  I connected with Jasmine Gordon on 11/29/19 at  9:00 AM EST by video enabled telemedicine visit and verified that I am speaking with the correct person using two identifiers. I discussed the limitations, risks, security and privacy concerns of performing an evaluation and management service by telemedicine and the availability of in-person appointments. I also discussed with the patient that there may be a patient responsible charge related to this service. The patient expressed understanding and agreed to proceed.   Other persons participating in the visit and their role in the encounter:  None  Patient's location: Home  Provider's location: office Chief Complaint: Iron deficiency anemia   INTERVAL HISTORY Jasmine Gordon is a 34 y.o. female who has above history reviewed by me today presents for follow up visit for management of iron deficiency anemia Problems and complaints are listed below:  Patient reports feeling well today.  No new complaints.   Review of Systems  Constitutional: Positive for fatigue. Negative for appetite change, chills and fever.  HENT:   Negative for hearing loss and voice change.   Eyes: Negative for eye problems.  Respiratory: Negative for chest tightness and cough.   Cardiovascular: Negative for chest pain.  Gastrointestinal: Negative for abdominal distention, abdominal pain and blood in stool.  Endocrine: Negative for hot flashes.  Genitourinary: Negative for difficulty urinating and frequency.   Musculoskeletal: Negative for arthralgias.  Skin: Negative for itching and rash.  Neurological: Negative for extremity weakness.  Hematological: Negative for adenopathy.  Psychiatric/Behavioral: Negative for confusion.    Past Medical History:  Diagnosis Date  . Allergy   . Anemia   . Asthma   . Migraine   . Panic attack   . PTSD (post-traumatic stress disorder) 2016   Past Surgical History:   Procedure Laterality Date  . APPENDECTOMY    . Plantar warts      Family History  Problem Relation Age of Onset  . Depression Mother   . Mental retardation Mother   . Bipolar disorder Mother   . Diabetes Father   . Stroke Maternal Grandfather   . Diabetes Maternal Grandfather     Social History   Socioeconomic History  . Marital status: Married    Spouse name: Not on file  . Number of children: Not on file  . Years of education: Not on file  . Highest education level: Not on file  Occupational History  . Not on file  Tobacco Use  . Smoking status: Never Smoker  . Smokeless tobacco: Never Used  Substance and Sexual Activity  . Alcohol use: No  . Drug use: No  . Sexual activity: Yes    Birth control/protection: Condom  Other Topics Concern  . Not on file  Social History Narrative  . Not on file   Social Determinants of Health   Financial Resource Strain:   . Difficulty of Paying Living Expenses: Not on file  Food Insecurity:   . Worried About Programme researcher, broadcasting/film/video in the Last Year: Not on file  . Ran Out of Food in the Last Year: Not on file  Transportation Needs:   . Lack of Transportation (Medical): Not on file  . Lack of Transportation (Non-Medical): Not on file  Physical Activity:   . Days of Exercise per Week: Not on file  . Minutes of Exercise per Session: Not on file  Stress:   . Feeling of Stress : Not on file  Social Connections:   .  Frequency of Communication with Friends and Family: Not on file  . Frequency of Social Gatherings with Friends and Family: Not on file  . Attends Religious Services: Not on file  . Active Member of Clubs or Organizations: Not on file  . Attends Archivist Meetings: Not on file  . Marital Status: Not on file  Intimate Partner Violence:   . Fear of Current or Ex-Partner: Not on file  . Emotionally Abused: Not on file  . Physically Abused: Not on file  . Sexually Abused: Not on file    Current Outpatient  Medications on File Prior to Visit  Medication Sig Dispense Refill  . Ergocalciferol (VITAMIN D2) 50 MCG (2000 UT) TABS Take by mouth.    . nortriptyline (PAMELOR) 10 MG capsule Take 1 pill at night for one week then increase to 2 pills at night    . omeprazole (PRILOSEC OTC) 20 MG tablet Take 1 tablet (20 mg total) by mouth daily. 28 tablet 1  . albuterol (PROVENTIL) (2.5 MG/3ML) 0.083% nebulizer solution Take 3 mLs (2.5 mg total) by nebulization every 6 (six) hours as needed for wheezing or shortness of breath. (Patient not taking: Reported on 11/29/2019) 150 mL 1  . buPROPion (WELLBUTRIN SR) 150 MG 12 hr tablet Take 1 tablet (150 mg total) by mouth 2 (two) times daily. (Patient not taking: Reported on 10/30/2019) 60 tablet 2  . busPIRone (BUSPAR) 5 MG tablet Take 1 tablet (5 mg total) by mouth 2 (two) times daily as needed (anxiety panic). (Patient not taking: Reported on 10/30/2019) 60 tablet 2  . famotidine (PEPCID) 20 MG tablet Take 1 tablet (20 mg total) by mouth daily. (Patient not taking: Reported on 10/30/2019) 30 tablet 1  . fluticasone (FLONASE) 50 MCG/ACT nasal spray Place 1 spray into both nostrils daily. 16 g 2  . ondansetron (ZOFRAN) 4 MG tablet Take 1 tablet (4 mg total) by mouth every 8 (eight) hours as needed. (Patient not taking: Reported on 10/30/2019) 20 tablet 0   No current facility-administered medications on file prior to visit.    Allergies  Allergen Reactions  . Reglan [Metoclopramide] Hives and Shortness Of Breath       Observations/Objective: Today's Vitals   11/29/19 0856  PainSc: 0-No pain   There is no height or weight on file to calculate BMI.  Physical Exam  Constitutional: No distress.    CBC    Component Value Date/Time   WBC 5.0 11/27/2019 1530   RBC 4.21 11/27/2019 1530   HGB 11.2 (L) 11/27/2019 1530   HGB 12.1 09/17/2014 1148   HCT 35.9 (L) 11/27/2019 1530   HCT 37.4 09/17/2014 1148   PLT 223 11/27/2019 1530   PLT 202 09/17/2014 1148   MCV  85.3 11/27/2019 1530   MCV 83 09/17/2014 1148   MCH 26.6 11/27/2019 1530   MCHC 31.2 11/27/2019 1530   RDW 12.7 11/27/2019 1530   RDW 14.1 09/17/2014 1148   LYMPHSABS 2.3 11/27/2019 1530   LYMPHSABS 2.1 09/17/2014 1148   MONOABS 0.3 11/27/2019 1530   MONOABS 0.3 09/17/2014 1148   EOSABS 0.1 11/27/2019 1530   EOSABS 0.1 09/17/2014 1148   BASOSABS 0.0 11/27/2019 1530   BASOSABS 0.0 09/17/2014 1148    CMP     Component Value Date/Time   NA 137 10/27/2019 2211   NA 140 09/17/2014 1148   K 3.1 (L) 10/27/2019 2211   K 3.5 09/17/2014 1148   CL 107 10/27/2019 2211   CL 107  09/17/2014 1148   CO2 22 10/27/2019 2211   CO2 24 09/17/2014 1148   GLUCOSE 131 (H) 10/27/2019 2211   GLUCOSE 101 (H) 09/17/2014 1148   BUN 13 10/27/2019 2211   BUN 11 09/17/2014 1148   CREATININE 0.72 10/27/2019 2211   CREATININE 0.79 10/25/2019 0816   CALCIUM 9.2 10/27/2019 2211   CALCIUM 8.7 09/17/2014 1148   PROT 7.0 10/25/2019 0816   PROT 7.5 09/17/2014 1148   ALBUMIN 4.1 09/24/2019 1545   ALBUMIN 3.7 09/17/2014 1148   AST 15 10/25/2019 0816   AST 13 (L) 09/17/2014 1148   ALT 9 10/25/2019 0816   ALT 16 09/17/2014 1148   ALKPHOS 46 09/24/2019 1545   ALKPHOS 57 09/17/2014 1148   BILITOT 0.4 10/25/2019 0816   BILITOT 0.4 09/17/2014 1148   GFRNONAA >60 10/27/2019 2211   GFRNONAA 100 10/25/2019 0816   GFRAA >60 10/27/2019 2211   GFRAA 116 10/25/2019 0816     Assessment and Plan: 1. Iron deficiency anemia due to chronic blood loss   Labs are reviewed and discussed with patient. Chronic anemia, hemoglobin slightly decreased 11.2.  Iron panel ordered reviewed.  No need for IV iron at this point.  Continue monitor. I will check CBC, iron, TIBC ferritin, vitamin B12, folate and reticulocyte panel at next visit.  Follow Up Instructions: 6 months   I discussed the assessment and treatment plan with the patient. The patient was provided an opportunity to ask questions and all were answered. The patient  agreed with the plan and demonstrated an understanding of the instructions.  The patient was advised to call back or seek an in-person evaluation if the symptoms worsen or if the condition fails to improve as anticipated.     [name], MD 11/29/2019 10:27 PM

## 2019-11-29 NOTE — Progress Notes (Signed)
Patient contacted for Mychart visit. No concerns voiced. 

## 2019-11-29 NOTE — Telephone Encounter (Signed)
Please advise 

## 2019-12-23 ENCOUNTER — Ambulatory Visit: Payer: BC Managed Care – PPO | Attending: Internal Medicine

## 2019-12-23 DIAGNOSIS — Z20822 Contact with and (suspected) exposure to covid-19: Secondary | ICD-10-CM

## 2019-12-24 LAB — NOVEL CORONAVIRUS, NAA: SARS-CoV-2, NAA: NOT DETECTED

## 2019-12-27 ENCOUNTER — Encounter: Payer: Self-pay | Admitting: Emergency Medicine

## 2019-12-27 ENCOUNTER — Other Ambulatory Visit: Payer: Self-pay

## 2019-12-27 ENCOUNTER — Emergency Department
Admission: EM | Admit: 2019-12-27 | Discharge: 2019-12-27 | Disposition: A | Discharge status: Home / Self Care | Payer: BC Managed Care – PPO | Attending: Student | Admitting: Student

## 2019-12-27 DIAGNOSIS — Z79899 Other long term (current) drug therapy: Secondary | ICD-10-CM | POA: Diagnosis not present

## 2019-12-27 DIAGNOSIS — R42 Dizziness and giddiness: Secondary | ICD-10-CM | POA: Diagnosis present

## 2019-12-27 DIAGNOSIS — J45909 Unspecified asthma, uncomplicated: Secondary | ICD-10-CM | POA: Insufficient documentation

## 2019-12-27 DIAGNOSIS — R6889 Other general symptoms and signs: Secondary | ICD-10-CM | POA: Diagnosis not present

## 2019-12-27 LAB — BASIC METABOLIC PANEL
Anion gap: 10 (ref 5–15)
BUN: 12 mg/dL (ref 6–20)
CO2: 22 mmol/L (ref 22–32)
Calcium: 9.4 mg/dL (ref 8.9–10.3)
Chloride: 104 mmol/L (ref 98–111)
Creatinine, Ser: 0.74 mg/dL (ref 0.44–1.00)
GFR calc Af Amer: >60 mL/min (ref >60)
GFR calc non Af Amer: >60 mL/min (ref >60)
Glucose, Bld: 103 mg/dL — ABNORMAL HIGH (ref 70–99)
Potassium: 3.9 mmol/L (ref 3.5–5.1)
Sodium: 136 mmol/L (ref 135–145)

## 2019-12-27 LAB — URINALYSIS, COMPLETE (UACMP) WITH MICROSCOPIC
Bacteria, UA: NONE SEEN
Bilirubin Urine: NEGATIVE
Glucose, UA: NEGATIVE mg/dL
Ketones, ur: NEGATIVE mg/dL
Leukocytes,Ua: NEGATIVE
Nitrite: NEGATIVE
Protein, ur: NEGATIVE mg/dL
Specific Gravity, Urine: 1.027 (ref 1.005–1.030)
pH: 5 (ref 5.0–8.0)

## 2019-12-27 LAB — POCT PREGNANCY, URINE: Preg Test, Ur: NEGATIVE

## 2019-12-27 LAB — GLUCOSE, CAPILLARY: Glucose-Capillary: 88 mg/dL (ref 70–99)

## 2019-12-27 LAB — CBC
HCT: 39.3 % (ref 36.0–46.0)
Hemoglobin: 12.7 g/dL (ref 12.0–15.0)
MCH: 26.7 pg (ref 26.0–34.0)
MCHC: 32.3 g/dL (ref 30.0–36.0)
MCV: 82.7 fL (ref 80.0–100.0)
Platelets: 236 10*3/uL (ref 150–400)
RBC: 4.75 MIL/uL (ref 3.87–5.11)
RDW: 12.7 % (ref 11.5–15.5)
WBC: 4.3 10*3/uL (ref 4.0–10.5)
nRBC: 0 % (ref 0.0–0.2)

## 2019-12-27 LAB — TROPONIN I (HIGH SENSITIVITY): Troponin I (High Sensitivity): <2 ng/L (ref <18)

## 2019-12-27 MED ORDER — LACTATED RINGERS IV BOLUS
1000.0000 mL | Freq: Once | INTRAVENOUS | Status: AC
  Administered 2019-12-27: 1000 mL via INTRAVENOUS

## 2019-12-27 NOTE — Discharge Instructions (Signed)
Thank you for letting us take care of you in the emergency department today.  ° °Please continue to take any regular, prescribed medications.  ° °Please follow up with: °- Your primary care doctor to review your ER visit and follow up on your symptoms.  ° °Please return to the ER for any new or worsening symptoms.  ° °

## 2019-12-27 NOTE — ED Provider Notes (Addendum)
[facility] Emergency Department Provider Note  ____________________________________________   First MD Initiated Contact with Patient 12/27/19 1123     (approximate)  I have reviewed the triage vital signs and the nursing notes.  History  Chief Complaint Dizziness    HPI Jasmine Gordon is a 34 y.o. female who presents to the emergency department for feeling "woozy" over the last several days.  Patient states she recently lost her father about a week ago.  Since then her appetite has decreased and she feels like she has had to make a more conscious effort to eat and drink, therefore she thinks she has been having less intake than normal.  She reports intermittent episodes of feeling lightheaded/weak.  She denies any true room spinning or dizziness component.  These episodes seem to come and go at random without any identifiable triggers.  These episodes improve when she drinks water or eats.  She denies any associated chest pain or palpitations.  No loss of consciousness.  No chest pains or difficulty breathing.  No history of VTE or arrhythmia.  No fevers, cough, sick contacts.   Past Medical Hx Past Medical History:  Diagnosis Date  . Allergy   . Anemia   . Asthma   . Migraine   . Panic attack   . PTSD (post-traumatic stress disorder) 2016    Problem List Patient Active Problem List   Diagnosis Date Noted  . Genital warts 09/11/2018  . Positive TB test 09/25/2017  . Allergic contact dermatitis due to metals 05/17/2017  . Pre-diabetes 03/06/2017  . Hyperlipidemia 03/06/2017  . Allergic reaction 02/08/2017  . Urticaria due to food allergy 02/08/2017  . Vitamin D deficiency 11/08/2016  . Iron deficiency anemia due to chronic blood loss 10/03/2016  . Menorrhagia with regular cycle 06/27/2016  . Mild persistent asthma 06/17/2016  . Anxiety and depression 06/17/2016  . Morbid obesity with BMI of 40.0-44.9, adult ([place]) 06/17/2016    Past  Surgical Hx Past Surgical History:  Procedure Laterality Date  . APPENDECTOMY    . Plantar warts      Medications Prior to Admission medications   Medication Sig Start Date End Date Taking? Authorizing Provider  albuterol (PROVENTIL) (2.5 MG/3ML) 0.083% nebulizer solution Take 3 mLs (2.5 mg total) by nebulization every 6 (six) hours as needed for wheezing or shortness of breath. Patient not taking: Reported on 11/29/2019 11/06/18   [name], DO  albuterol (VENTOLIN HFA) 108 (90 Base) MCG/ACT inhaler Inhale 2 puffs into the lungs every 6 (six) hours as needed for wheezing or shortness of breath. 11/29/19   Karamalegos, Devonne Doughty, DO  buPROPion (WELLBUTRIN SR) 150 MG 12 hr tablet Take 1 tablet (150 mg total) by mouth 2 (two) times daily. Patient not taking: Reported on 10/30/2019 10/25/19   [name], DO  busPIRone (BUSPAR) 5 MG tablet Take 1 tablet (5 mg total) by mouth 2 (two) times daily as needed (anxiety panic). Patient not taking: Reported on 10/30/2019 10/25/19   [name], DO  Ergocalciferol (VITAMIN D2) 50 MCG (2000 UT) TABS Take by mouth.    [provider]  famotidine (PEPCID) 20 MG tablet Take 1 tablet (20 mg total) by mouth daily. Patient not taking: Reported on 10/30/2019 07/20/19 07/19/20  Schuyler Amor, MD  fluticasone [facility]) 50 MCG/ACT nasal spray Place 1 spray into both nostrils daily. 08/20/18 08/20/19  Lannie Fields, PA-C  nortriptyline (PAMELOR) 10 MG capsule Take 1 pill at night  for one week then increase to 2 pills at night 10/10/19   [provider]  omeprazole (PRILOSEC OTC) 20 MG tablet Take 1 tablet (20 mg total) by mouth daily. 10/28/19 10/27/20  Loleta Rose, MD  ondansetron (ZOFRAN) 4 MG tablet Take 1 tablet (4 mg total) by mouth every 8 (eight) hours as needed. Patient not taking: Reported on 10/30/2019 09/24/19   Phineas Semen, MD    Allergies Reglan [metoclopramide]  Family Hx Family History   Problem Relation Age of Onset  . Depression Mother   . Mental retardation Mother   . Bipolar disorder Mother   . Diabetes Father   . Stroke Maternal Grandfather   . Diabetes Maternal Grandfather     Social Hx Social History   Tobacco Use  . Smoking status: Never Smoker  . Smokeless tobacco: Never Used  Substance Use Topics  . Alcohol use: No  . Drug use: No     Review of Systems  Constitutional: Negative for fever, chills. + feeling "woosy" Eyes: Negative for visual changes. ENT: Negative for sore throat. Cardiovascular: Negative for chest pain. Respiratory: Negative for shortness of breath. Gastrointestinal: Negative for nausea, vomiting.  Genitourinary: Negative for dysuria. Musculoskeletal: Negative for leg swelling. Skin: Negative for rash. Neurological: Negative for headaches.   Physical Exam  Vital Signs: ED Triage Vitals  Enc Vitals Group     BP 12/27/19 1004 (!) 138/92     Pulse Rate 12/27/19 1004 (!) 119     Resp 12/27/19 1004 20     Temp 12/27/19 1004 99.2 F (37.3 C)     Temp Source 12/27/19 1004 Oral     SpO2 12/27/19 1004 100 %     Weight 12/27/19 1005 251 lb (113.9 kg)     Height 12/27/19 1005 5\' 7"  (1.702 m)     Head Circumference --      Peak Flow --      Pain Score 12/27/19 1005 0     Pain Loc --      Pain Edu? --      Excl. in GC? --     Constitutional: Alert and oriented.  Head: Normocephalic. Atraumatic. Eyes: Conjunctivae clear. Sclera anicteric. Nose: No congestion. No rhinorrhea. Mouth/Throat: Wearing mask.  Neck: No stridor.   Cardiovascular: Normal rate, regular rhythm. Extremities well perfused. Respiratory: Normal respiratory effort.  Lungs CTAB. Gastrointestinal: Soft. Non-tender. Non-distended.  Musculoskeletal: No lower extremity edema. No deformities. Neurologic:  Normal speech and language. No gross focal neurologic deficits are appreciated.  Skin: Skin is warm, dry and intact. No rash noted. Psychiatric: Mood and  affect are appropriate for situation.  EKG  Personally reviewed.   Rate: 104 Rhythm: sinus Axis: normal Intervals: WNL No QT prolongation, no evidence of [place] or WPW No STEMI   Procedures  Procedure(s) performed (including critical care):  Procedures   Initial Impression / Assessment and Plan / ED Course  34 y.o. female who presents to the ED for intermittent episodes of lightheadedness.  Ddx: UTI, pregnancy, anemia, hypoglycemia, dehydration, electrolyte abnormality, arrhythmia  Will evaluate with labs, urine, EKG. will provide fluids, PO and reassess.  No evidence of anemia, electrolyte abnormality, negative pregnancy.  Negative troponin.  EKG without acute ischemia or evidence of acute arrhythmia.  Patient feeling improved after IVF and has tolerated PO without difficulty.  As such, given negative work-up will proceed with discharge with outpatient follow-up.  Patient agreeable.  Given return precautions.   Final Clinical Impression(s) / ED Diagnosis  Final  diagnoses:  Feeling unwell       Note:  This document was prepared using Dragon voice recognition software and may include unintentional dictation errors.     Miguel Aschoff., MD 12/27/19 [phone]

## 2019-12-27 NOTE — ED Triage Notes (Signed)
Pt reports over the last 3 days has felt "whoozy or lightheaded. Pt reports worse in the morning and eases off as the days goes on. Pt also reports some nausea. Denies pain or other sx's.

## 2019-12-27 NOTE — ED Notes (Signed)
Pt comfortable in bed watching TV and on Phone.

## 2019-12-27 NOTE — ED Notes (Signed)
Pt states she has been feeling "light-headed and weak" for almost a week. Pt denies LOC. Pt states she lost her father on the 29th and has had to make a concious effort to eat and drink.

## 2020-01-01 ENCOUNTER — Encounter: Payer: Self-pay | Admitting: Family Medicine

## 2020-01-01 ENCOUNTER — Other Ambulatory Visit: Payer: Self-pay

## 2020-01-01 ENCOUNTER — Ambulatory Visit (INDEPENDENT_AMBULATORY_CARE_PROVIDER_SITE_OTHER): Payer: BC Managed Care – PPO | Admitting: Family Medicine

## 2020-01-01 VITALS — BP 126/77 | HR 91 | Temp 97.7°F | Ht 67.0 in | Wt 252.4 lb

## 2020-01-01 DIAGNOSIS — F32A Depression, unspecified: Secondary | ICD-10-CM

## 2020-01-01 DIAGNOSIS — F419 Anxiety disorder, unspecified: Secondary | ICD-10-CM

## 2020-01-01 DIAGNOSIS — F329 Major depressive disorder, single episode, unspecified: Secondary | ICD-10-CM | POA: Diagnosis not present

## 2020-01-01 NOTE — Progress Notes (Signed)
Virtual Visit via Telephone The purpose of this virtual visit is to provide medical care while limiting exposure to the novel coronavirus (COVID19) for both patient and office staff.  Consent was obtained for phone visit:  Yes.   Answered questions that patient had about telehealth interaction:  Yes.   I discussed the limitations, risks, security and privacy concerns of performing an evaluation and management service by telephone. I also discussed with the patient that there may be a patient responsible charge related to this service. The patient expressed understanding and agreed to proceed.  Patient Location: Home Provider Location: [facility])  ---------------------------------------------------------------------- Chief Complaint  Patient presents with  . Shortness of Breath    pt complains of SOB since with exertion. Also concern because she lose 8lb pounds in the last week after the lose of her father. Decrease in appetite after her father passed    S: Reviewed CMA documentation. I have called patient and gathered additional HPI as follows:   ED FOLLOW-UP VISIT  Hospital/Location: [facility] Date of ED Visit: 12/27/19  Reason for Presenting to ED: lightheaded, dizziness, anxiety  FOLLOW-UP  - ED provider note and record have been reviewed - Patient presents today about 5 days after recent ED visit. Brief summary of recent course, patient had symptoms of feeling very "woozy" and lightheadedness and dizziness, she says that her father passed away 2019-12-18. She described significant anxiety related to this stressor and also had very poor intake, she was still actively working. While at work she started to feel worse and lightheaded, she had worsening episodes of dizziness and lightheaded. Poor intake for several days and felt anxious about that as well. - Father passed away from heart failure, and now she is more concerned of heart disease and diabetes, also worried  about her glucose given fam history of diabetes.  - in ED, she was tested with EKG, CBG 88 fasting, blood chemistry BMET was normal range, she had UA - She was given IVF rehydration lactated ringers - She did a COVID19 test after potential exposure while at her father's funeral. - She had some weight loss in past 1 month, she has been eating not as healthy admittedly  More anxious now, work related Not taking Vitamin D as much. Had low reading previously.  She has concerns about weight loss 7-9 lbs in past 1-2 months. Says she is eating worse and confused why gaining weight.  - Today reports overall has done well after discharge from ED. Symptoms of lightheadedness dizziness and anxiety have improved. No further acute symptoms.  Denies any high risk travel to areas of current concern for COVID19. Denies any known or suspected exposure to person with or possibly with COVID19.  Denies any fevers, chills, sweats, body ache, cough, shortness of breath, sinus pain or pressure, headache, abdominal pain, diarrhea  Past Medical History:  Diagnosis Date  . Allergy   . Anemia   . Asthma   . Migraine   . Panic attack   . PTSD (post-traumatic stress disorder) 2016   Social History   Tobacco Use  . Smoking status: Never Smoker  . Smokeless tobacco: Never Used  Substance Use Topics  . Alcohol use: No  . Drug use: No    Current Outpatient Medications:  .  albuterol (PROVENTIL) (2.5 MG/3ML) 0.083% nebulizer solution, Take 3 mLs (2.5 mg total) by nebulization every 6 (six) hours as needed for wheezing or shortness of breath., Disp: 150 mL, Rfl: 1 .  albuterol (VENTOLIN HFA) 108 (90 Base) MCG/ACT inhaler, Inhale 2 puffs into the lungs every 6 (six) hours as needed for wheezing or shortness of breath., Disp: 8 g, Rfl: 3 .  Ergocalciferol (VITAMIN D2) 50 MCG (2000 UT) TABS, Take by mouth., Disp: , Rfl:  .  famotidine (PEPCID) 20 MG tablet, Take 1 tablet (20 mg total) by mouth daily.,  Disp: 30 tablet, Rfl: 1 .  nortriptyline (PAMELOR) 10 MG capsule, Take 1 pill at night for one week then increase to 2 pills at night, Disp: , Rfl:  .  buPROPion (WELLBUTRIN SR) 150 MG 12 hr tablet, Take 1 tablet (150 mg total) by mouth 2 (two) times daily. (Patient not taking: Reported on 10/30/2019), Disp: 60 tablet, Rfl: 2 .  busPIRone (BUSPAR) 5 MG tablet, Take 1 tablet (5 mg total) by mouth 2 (two) times daily as needed (anxiety panic). (Patient not taking: Reported on 10/30/2019), Disp: 60 tablet, Rfl: 2 .  fluticasone (FLONASE) 50 MCG/ACT nasal spray, Place 1 spray into both nostrils daily., Disp: 16 g, Rfl: 2 .  omeprazole (PRILOSEC OTC) 20 MG tablet, Take 1 tablet (20 mg total) by mouth daily. (Patient not taking: Reported on 01/01/2020), Disp: 28 tablet, Rfl: 1 .  ondansetron (ZOFRAN) 4 MG tablet, Take 1 tablet (4 mg total) by mouth every 8 (eight) hours as needed. (Patient not taking: Reported on 10/30/2019), Disp: 20 tablet, Rfl: 0  Depression screen [facility] 2/9 01/01/2020 10/25/2019 08/16/2019  Decreased Interest 3 2 0  Down, Depressed, Hopeless 3 2 0  PHQ - 2 Score 6 4 0  Altered sleeping 3 0 1  Tired, decreased energy 1 2 0  Change in appetite 1 0 2  Feeling bad or failure about yourself  1 3 2   Trouble concentrating 3 2 0  Moving slowly or fidgety/restless 0 0 0  Suicidal thoughts 0 0 0  PHQ-9 Score 15 11 5   Difficult doing work/chores Extremely dIfficult Very difficult Somewhat difficult    GAD 7 : Generalized Anxiety Score 01/01/2020 10/25/2019 08/16/2019 05/28/2019  Nervous, Anxious, on Edge 3 3 2 2   Control/stop worrying 3 3 1 1   Worry too much - different things 3 3 1 2   Trouble relaxing 3 2 1 3   Restless 1 3 0 2  Easily annoyed or irritable 3 2 1 1   Afraid - awful might happen 3 3 2 3   Total GAD 7 Score 19 19 8 14   Anxiety Difficulty Very difficult Extremely difficult Somewhat difficult Somewhat difficult     -------------------------------------------------------------------------- O: No physical exam performed due to remote telephone encounter.  BP 126/77   Pulse 91   Temp 97.7 F (36.5 C)   Ht 5\' 7"  (1.702 m)   Wt 252 lb 6.4 oz (114.5 kg)   LMP 12/09/2019 (Exact Date)   BMI 39.53 kg/m   Lab results reviewed.  Recent Results (from the past 2160 hour(s))  T4, free     Status: None   Collection Time: 10/25/19  8:16 AM  Result Value Ref Range   Free T4 1.0 0.8 - 1.8 ng/dL  TSH     Status: None   Collection Time: 10/25/19  8:16 AM  Result Value Ref Range   TSH 2.81 mIU/L    Comment:           Reference Range .           > or = 20 Years  0.40-4.50 .  Pregnancy Ranges           First trimester    0.26-2.66           Second trimester   0.55-2.73           Third trimester    0.43-2.91   VITAMIN D 25 Hydroxy (Vit-D Deficiency, Fractures)     Status: Abnormal   Collection Time: 10/25/19  8:16 AM  Result Value Ref Range   Vit D, 25-Hydroxy 14 (L) 30 - 100 ng/mL    Comment: Vitamin D Status         25-OH Vitamin D: . Deficiency:                    <20 ng/mL Insufficiency:             20 - 29 ng/mL Optimal:                 > or = 30 ng/mL . For 25-OH Vitamin D testing on patients on  D2-supplementation and patients for whom quantitation  of D2 and D3 fractions is required, the [facility](TM) 25-OH VIT D, (D2,D3), LC/MS/MS is recommended: order  code [id number] (patients >50yrs). See Note 1 . Note 1 . For additional information, please refer to  http://education.[facility].com/faq/FAQ199  (This link is being provided for informational/ educational purposes only.)   Vitamin B12     Status: None   Collection Time: 10/25/19  8:16 AM  Result Value Ref Range   Vitamin B-12 388 200 - 1,100 pg/mL    Comment: . Please Note: Although the reference range for vitamin B12 is 240-177-4040 pg/mL, it has been reported that between 5 and 10% of patients with values  between 200 and 400 pg/mL may experience neuropsychiatric and hematologic abnormalities due to occult B12 deficiency; less than 1% of patients with values above 400 pg/mL will have symptoms. .   Lipid panel     Status: Abnormal   Collection Time: 10/25/19  8:16 AM  Result Value Ref Range   Cholesterol 225 (H) <200 mg/dL   HDL 47 (L) > OR = 50 mg/dL   Triglycerides 98 <150 mg/dL   LDL Cholesterol (Calc) 157 (H) mg/dL (calc)    Comment: Reference range: <100 . Desirable range <100 mg/dL for primary prevention;   <70 mg/dL for patients with CHD or diabetic patients  with > or = 2 CHD risk factors. Marland Kitchen LDL-C is now calculated using the Martin-Hopkins  calculation, which is a validated novel method providing  better accuracy than the Friedewald equation in the  estimation of LDL-C.  Cresenciano Genre et al. Annamaria Helling. 7989;211(94): 2061-2068  (http://education.[facility].com/faq/FAQ164)    Total CHOL/HDL Ratio 4.8 <5.0 (calc)   Non-HDL Cholesterol (Calc) 178 (H) <130 mg/dL (calc)    Comment: For patients with diabetes plus 1 major ASCVD risk  factor, treating to a non-HDL-C goal of <100 mg/dL  (LDL-C of <70 mg/dL) is considered a therapeutic  option.   COMPLETE METABOLIC PANEL WITH GFR     Status: None   Collection Time: 10/25/19  8:16 AM  Result Value Ref Range   Glucose, Bld 92 65 - 99 mg/dL    Comment: .            Fasting reference interval .    BUN 13 7 - 25 mg/dL   Creat 0.79 0.50 - 1.10 mg/dL   GFR, Est Non African American 100 > OR = 60 mL/min/1.26m2   GFR, Est African American  116 > OR = 60 mL/min/1.1173m2   BUN/Creatinine Ratio NOT APPLICABLE 6 - 22 (calc)   Sodium 139 135 - 146 mmol/L   Potassium 3.8 3.5 - 5.3 mmol/L   Chloride 107 98 - 110 mmol/L   CO2 23 20 - 32 mmol/L   Calcium 9.4 8.6 - 10.2 mg/dL   Total Protein 7.0 6.1 - 8.1 g/dL   Albumin 4.4 3.6 - 5.1 g/dL   Globulin 2.6 1.9 - 3.7 g/dL (calc)   AG Ratio 1.7 1.0 - 2.5 (calc)   Total Bilirubin 0.4 0.2 - 1.2  mg/dL   Alkaline phosphatase (APISO) 45 31 - 125 U/L   AST 15 10 - 30 U/L   ALT 9 6 - 29 U/L  CBC with Differential/Platelet     Status: Abnormal   Collection Time: 10/25/19  8:16 AM  Result Value Ref Range   WBC 3.9 3.8 - 10.8 Thousand/uL   RBC 4.50 3.80 - 5.10 Million/uL   Hemoglobin 12.0 11.7 - 15.5 g/dL   HCT 16.137.9 09.635.0 - 04.545.0 %   MCV 84.2 80.0 - 100.0 fL   MCH 26.7 (L) 27.0 - 33.0 pg   MCHC 31.7 (L) 32.0 - 36.0 g/dL   RDW 40.912.8 81.111.0 - 91.415.0 %   Platelets 233 140 - 400 Thousand/uL   MPV 11.0 7.5 - 12.5 fL   Neutro Abs 1,856 1,500 - 7,800 cells/uL   Lymphs Abs 1,775 850 - 3,900 cells/uL   Absolute Monocytes 199 (L) 200 - 950 cells/uL   Eosinophils Absolute 51 15 - 500 cells/uL   Basophils Absolute 20 0 - 200 cells/uL   Neutrophils Relative % 47.6 %   Total Lymphocyte 45.5 %   Monocytes Relative 5.1 %   Eosinophils Relative 1.3 %   Basophils Relative 0.5 %  Hemoglobin A1c     Status: Abnormal   Collection Time: 10/25/19  8:16 AM  Result Value Ref Range   Hgb A1c MFr Bld 5.7 (H) <5.7 % of total Hgb    Comment: For someone without known diabetes, a hemoglobin  A1c value between 5.7% and 6.4% is consistent with prediabetes and should be confirmed with a  follow-up test. . For someone with known diabetes, a value <7% indicates that their diabetes is well controlled. A1c targets should be individualized based on duration of diabetes, age, comorbid conditions, and other considerations. . This assay result is consistent with an increased risk of diabetes. . Currently, no consensus exists regarding use of hemoglobin A1c for diagnosis of diabetes for children. .    Mean Plasma Glucose 117 (calc)   eAG (mmol/L) 6.5 (calc)  Basic metabolic panel     Status: Abnormal   Collection Time: 10/27/19 10:11 PM  Result Value Ref Range   Sodium 137 135 - 145 mmol/L   Potassium 3.1 (L) 3.5 - 5.1 mmol/L   Chloride 107 98 - 111 mmol/L   CO2 22 22 - 32 mmol/L   Glucose, Bld 131 (H) 70  - 99 mg/dL   BUN 13 6 - 20 mg/dL   Creatinine, Ser 7.820.72 0.44 - 1.00 mg/dL   Calcium 9.2 8.9 - 95.610.3 mg/dL   GFR calc non Af Amer >60 >60 mL/min   GFR calc Af Amer >60 >60 mL/min   Anion gap 8 5 - 15    Comment: Performed at [facility], [address] [place], [place] [address]  CBC     Status: Abnormal   Collection Time: 10/27/19 10:11 PM  Result Value Ref Range   WBC 5.0 4.0 - 10.5 K/uL   RBC 4.25 3.87 - 5.11 MIL/uL   Hemoglobin 11.3 (L) 12.0 - 15.0 g/dL   HCT 85.6 (L) 31.4 - 97.0 %   MCV 80.2 80.0 - 100.0 fL   MCH 26.6 26.0 - 34.0 pg   MCHC 33.1 30.0 - 36.0 g/dL   RDW 26.3 78.5 - 88.5 %   Platelets 212 150 - 400 K/uL   nRBC 0.0 0.0 - 0.2 %    Comment: Performed at [facility], [address] [place], [place] [address]  Troponin I (High Sensitivity)     Status: None   Collection Time: 10/27/19 10:11 PM  Result Value Ref Range   Troponin I (High Sensitivity) <2 <18 ng/L    Comment: (NOTE) Elevated high sensitivity troponin I (hsTnI) values and significant  changes across serial measurements may suggest ACS but many other  chronic and acute conditions are known to elevate hsTnI results.  Refer to the "Links" section for chest pain algorithms and additional  guidance. Performed at [facility], [address] [place], [place] [address]   Pregnancy, urine POC     Status: None   Collection Time: 10/27/19 10:25 PM  Result Value Ref Range   Preg Test, Ur NEGATIVE NEGATIVE    Comment:        THE SENSITIVITY OF THIS METHODOLOGY IS >24 mIU/mL   Cytology - PAP     Status: Abnormal   Collection Time: 11/26/19  9:00 AM  Result Value Ref Range   High risk HPV Positive (A)    Adequacy      Satisfactory for evaluation; transformation zone component PRESENT.   Diagnosis      - Negative for intraepithelial lesion or malignancy (NILM)   Comment Normal Reference Range HPV - Negative   Iron and TIBC     Status: None   Collection Time: 11/27/19  3:30  PM  Result Value Ref Range   Iron 51 28 - 170 ug/dL   TIBC 676 720 - 947 ug/dL   Saturation Ratios 17 10.4 - 31.8 %   UIBC 246 ug/dL    Comment: Performed at [facility], [address] [place], [place] [address]  Ferritin     Status: None   Collection Time: 11/27/19  3:30 PM  Result Value Ref Range   Ferritin 38 11 - 307 ng/mL    Comment: Performed at [facility], [address] [place], [place] [address]  CBC with Differential/Platelet     Status: Abnormal   Collection Time: 11/27/19  3:30 PM  Result Value Ref Range   WBC 5.0 4.0 - 10.5 K/uL   RBC 4.21 3.87 - 5.11 MIL/uL   Hemoglobin 11.2 (L) 12.0 - 15.0 g/dL   HCT 47.6 (L) 54.6 - 50.3 %   MCV 85.3 80.0 - 100.0 fL   MCH 26.6 26.0 - 34.0 pg   MCHC 31.2 30.0 - 36.0 g/dL   RDW 54.6 56.8 - 12.7 %   Platelets 223 150 - 400 K/uL   nRBC 0.0 0.0 - 0.2 %   Neutrophils Relative % 45 %   Neutro Abs 2.2 1.7 - 7.7 K/uL   Lymphocytes Relative 46 %   Lymphs Abs 2.3 0.7 - 4.0 K/uL   Monocytes Relative 7 %   Monocytes Absolute 0.3 0.1 - 1.0 K/uL   Eosinophils Relative 1 %   Eosinophils Absolute 0.1 0.0 - 0.5 K/uL  Basophils Relative 1 %   Basophils Absolute 0.0 0.0 - 0.1 K/uL   Immature Granulocytes 0 %   Abs Immature Granulocytes 0.01 0.00 - 0.07 K/uL    Comment: Performed at [facility], [address] [place], [place] [address]  Novel Coronavirus, NAA ([facility])     Status: None   Collection Time: 12/23/19  8:22 AM   Specimen: Oropharyngeal(OP) collection in vial transport medium   OROPHARYNGEA  TESTING  Result Value Ref Range   SARS-CoV-2, NAA Not Detected Not Detected    Comment: This nucleic acid amplification test was developed and its performance characteristics determined by [facility]. Nucleic acid amplification tests include RT-PCR and TMA. This test has not been FDA cleared or approved. This test has been authorized by FDA under an Emergency Use Authorization (EUA). This test is  only authorized for the duration of time the declaration that circumstances exist justifying the authorization of the emergency use of in vitro diagnostic tests for detection of SARS-CoV-2 virus and/or diagnosis of COVID-19 infection under section 564(b)(1) of the Act, 21 U.S.C. 638VFI-4(P360bbb-3(b) (1), unless the authorization is terminated or revoked sooner. When diagnostic testing is negative, the possibility of a false negative result should be considered in the context of a patient's recent exposures and the presence of clinical signs and symptoms consistent with COVID-19. An individual without symptoms of COVID-19 and who is not shedding SARS-CoV-2 virus wo uld expect to have a negative (not detected) result in this assay.   Glucose, capillary     Status: None   Collection Time: 12/27/19 10:22 AM  Result Value Ref Range   Glucose-Capillary 88 70 - 99 mg/dL  Basic metabolic panel     Status: Abnormal   Collection Time: 12/27/19 10:27 AM  Result Value Ref Range   Sodium 136 135 - 145 mmol/L   Potassium 3.9 3.5 - 5.1 mmol/L   Chloride 104 98 - 111 mmol/L   CO2 22 22 - 32 mmol/L   Glucose, Bld 103 (H) 70 - 99 mg/dL   BUN 12 6 - 20 mg/dL   Creatinine, Ser 3.290.74 0.44 - 1.00 mg/dL   Calcium 9.4 8.9 - 51.810.3 mg/dL   GFR calc non Af Amer >60 >60 mL/min   GFR calc Af Amer >60 >60 mL/min   Anion gap 10 5 - 15    Comment: Performed at [facility], [address] [place], [place] [address]  CBC     Status: None   Collection Time: 12/27/19 10:27 AM  Result Value Ref Range   WBC 4.3 4.0 - 10.5 K/uL   RBC 4.75 3.87 - 5.11 MIL/uL   Hemoglobin 12.7 12.0 - 15.0 g/dL   HCT 06.339.3 01.636.0 - 01.046.0 %   MCV 82.7 80.0 - 100.0 fL   MCH 26.7 26.0 - 34.0 pg   MCHC 32.3 30.0 - 36.0 g/dL   RDW 93.212.7 35.511.5 - 73.215.5 %   Platelets 236 150 - 400 K/uL   nRBC 0.0 0.0 - 0.2 %    Comment: Performed at [facility], [address] [place], [place] [address]  Urinalysis, Complete w Microscopic      Status: Abnormal   Collection Time: 12/27/19 10:27 AM  Result Value Ref Range   Color, Urine YELLOW (A) YELLOW   APPearance CLEAR (A) CLEAR   Specific Gravity, Urine 1.027 1.005 - 1.030   pH 5.0 5.0 - 8.0   Glucose, UA NEGATIVE NEGATIVE mg/dL   Hgb urine dipstick SMALL (  A) NEGATIVE   Bilirubin Urine NEGATIVE NEGATIVE   Ketones, ur NEGATIVE NEGATIVE mg/dL   Protein, ur NEGATIVE NEGATIVE mg/dL   Nitrite NEGATIVE NEGATIVE   Leukocytes,Ua NEGATIVE NEGATIVE   RBC / HPF 0-5 0 - 5 RBC/hpf   WBC, UA 0-5 0 - 5 WBC/hpf   Bacteria, UA NONE SEEN NONE SEEN   Squamous Epithelial / LPF 0-5 0 - 5   Mucus PRESENT     Comment: Performed at [facility], [address] [place], [place] [address]  Pregnancy, urine POC     Status: None   Collection Time: 12/27/19 10:36 AM  Result Value Ref Range   Preg Test, Ur NEGATIVE NEGATIVE    Comment:        THE SENSITIVITY OF THIS METHODOLOGY IS >24 mIU/mL   Troponin I (High Sensitivity)     Status: None   Collection Time: 12/27/19 11:47 AM  Result Value Ref Range   Troponin I (High Sensitivity) <2 <18 ng/L    Comment: (NOTE) Elevated high sensitivity troponin I (hsTnI) values and significant  changes across serial measurements may suggest ACS but many other  chronic and acute conditions are known to elevate hsTnI results.  Refer to the "Links" section for chest pain algorithms and additional  guidance. Performed at [facility], [address] [place], [place] [address]     -------------------------------------------------------------------------- A&P:  Problem List Items Addressed This Visit    Anxiety and depression - Primary     Clinically seems most of her constellation of symptoms ultimately related back to her underlying anxiety issue, and recent life stressors / mood with loss of her father. Other stressors with family/work COVID concerns affecting her as well. - Seems similar to prior episode, if she is under acute  stress/anxiety she often is not eating or hydrating as well and can cause her to get sick and she seems to be sensitive to these changes. - She is worried about multiple health conditions including heart disease, diabetes, and unintentional weight loss - Discussed her concerns, provided reassurance - No new treatment today - Recommended that she improve lifestyle diet exercise, monitoring weight - Recommend that she establish with therapist/counselor for mental health, goal to work on behavioral strategies to cope with stressors / anxiety, to avoid letting it affect her to point of feeling physically ill - She will notify us if need referral for therapist / counselor  No orders of the defined types were placed in this encounter.   Follow-up: - Return as needed  Patient verbalizes understanding with the above medical recommendations including the limitation of remote medical advice.  Specific follow-up and call-back criteria were given for patient to follow-up or seek medical care more urgently if needed.   - Time spent in direct consultation with patient on phone: 18 minutes   [name], DO [facility] 01/01/2020, 3:15 PM

## 2020-01-01 NOTE — Patient Instructions (Addendum)
Most likely anxiety related and nutritional issues with previous not eating as well. Keep track of weight on one scale keep it consistent  I do strongly recommend establishing with a mental health provider / therapist / counselor to discuss your history of anxiety and how it impacts your daily function, seems often anxiety is the trigger for other physical ailments for you and may benefit from learning ways to handle the anxiety better and avoid problematic patterns.  Let me know if need referral   Please schedule a Follow-up Appointment to: Return if symptoms worsen or fail to improve.  If you have any other questions or concerns, please feel free to call the office or send a message through MyChart. You may also schedule an earlier appointment if necessary.  Additionally, you may be receiving a survey about your experience at our office within a few days to 1 week by e-mail or mail. We value your feedback.  [name], DO [facility], [place]

## 2020-01-16 ENCOUNTER — Encounter: Payer: Self-pay | Admitting: Obstetrics and Gynecology

## 2020-01-24 ENCOUNTER — Other Ambulatory Visit: Payer: BC Managed Care – PPO

## 2020-01-27 ENCOUNTER — Ambulatory Visit: Payer: BC Managed Care – PPO

## 2020-01-27 ENCOUNTER — Ambulatory Visit: Payer: BC Managed Care – PPO | Admitting: Oncology

## 2020-02-20 ENCOUNTER — Other Ambulatory Visit
Admission: RE | Admit: 2020-02-20 | Discharge: 2020-02-20 | Disposition: A | Discharge status: Home / Self Care | Payer: BC Managed Care – PPO | Source: Ambulatory Visit | Attending: Pediatrics | Admitting: Pediatrics

## 2020-02-20 DIAGNOSIS — R0602 Shortness of breath: Secondary | ICD-10-CM | POA: Diagnosis not present

## 2020-02-20 DIAGNOSIS — Z03818 Encounter for observation for suspected exposure to other biological agents ruled out: Secondary | ICD-10-CM | POA: Insufficient documentation

## 2020-02-20 DIAGNOSIS — E559 Vitamin D deficiency, unspecified: Secondary | ICD-10-CM

## 2020-02-20 LAB — FIBRIN DERIVATIVES D-DIMER (ARMC ONLY): Fibrin derivatives D-dimer (ARMC): 535.12 ng/mL (FEU) — ABNORMAL HIGH (ref 0.00–499.00)

## 2020-02-21 ENCOUNTER — Emergency Department
Admission: EM | Admit: 2020-02-21 | Discharge: 2020-02-21 | Disposition: A | Discharge status: Home / Self Care | Payer: BC Managed Care – PPO | Attending: Emergency Medicine | Admitting: Emergency Medicine

## 2020-02-21 ENCOUNTER — Emergency Department: Payer: BC Managed Care – PPO

## 2020-02-21 ENCOUNTER — Other Ambulatory Visit: Payer: Self-pay

## 2020-02-21 DIAGNOSIS — R0600 Dyspnea, unspecified: Secondary | ICD-10-CM | POA: Insufficient documentation

## 2020-02-21 DIAGNOSIS — R0602 Shortness of breath: Secondary | ICD-10-CM | POA: Diagnosis present

## 2020-02-21 DIAGNOSIS — F419 Anxiety disorder, unspecified: Secondary | ICD-10-CM

## 2020-02-21 DIAGNOSIS — Z79899 Other long term (current) drug therapy: Secondary | ICD-10-CM | POA: Insufficient documentation

## 2020-02-21 LAB — COMPREHENSIVE METABOLIC PANEL
ALT: 10 U/L (ref 0–44)
AST: 12 U/L — ABNORMAL LOW (ref 15–41)
Albumin: 4.2 g/dL (ref 3.5–5.0)
Alkaline Phosphatase: 43 U/L (ref 38–126)
Anion gap: 8 (ref 5–15)
BUN: 14 mg/dL (ref 6–20)
CO2: 22 mmol/L (ref 22–32)
Calcium: 9.3 mg/dL (ref 8.9–10.3)
Chloride: 109 mmol/L (ref 98–111)
Creatinine, Ser: 0.66 mg/dL (ref 0.44–1.00)
GFR calc Af Amer: >60 mL/min (ref >60)
GFR calc non Af Amer: >60 mL/min (ref >60)
Glucose, Bld: 103 mg/dL — ABNORMAL HIGH (ref 70–99)
Potassium: 3.7 mmol/L (ref 3.5–5.1)
Sodium: 139 mmol/L (ref 135–145)
Total Bilirubin: 0.6 mg/dL (ref 0.3–1.2)
Total Protein: 7.6 g/dL (ref 6.5–8.1)

## 2020-02-21 LAB — URINALYSIS, COMPLETE (UACMP) WITH MICROSCOPIC
Bacteria, UA: NONE SEEN
Bilirubin Urine: NEGATIVE
Glucose, UA: NEGATIVE mg/dL
Hgb urine dipstick: NEGATIVE
Ketones, ur: NEGATIVE mg/dL
Leukocytes,Ua: NEGATIVE
Nitrite: NEGATIVE
Protein, ur: NEGATIVE mg/dL
Specific Gravity, Urine: 1.019 (ref 1.005–1.030)
pH: 6 (ref 5.0–8.0)

## 2020-02-21 LAB — CBC
HCT: 38.8 % (ref 36.0–46.0)
Hemoglobin: 12.6 g/dL (ref 12.0–15.0)
MCH: 26.8 pg (ref 26.0–34.0)
MCHC: 32.5 g/dL (ref 30.0–36.0)
MCV: 82.6 fL (ref 80.0–100.0)
Platelets: 214 10*3/uL (ref 150–400)
RBC: 4.7 MIL/uL (ref 3.87–5.11)
RDW: 12.9 % (ref 11.5–15.5)
WBC: 5 10*3/uL (ref 4.0–10.5)
nRBC: 0 % (ref 0.0–0.2)

## 2020-02-21 LAB — POCT PREGNANCY, URINE: Preg Test, Ur: NEGATIVE

## 2020-02-21 LAB — TROPONIN I (HIGH SENSITIVITY)
Troponin I (High Sensitivity): <2 ng/L (ref <18)
Troponin I (High Sensitivity): <2 ng/L (ref <18)

## 2020-02-21 LAB — LIPASE, BLOOD: Lipase: 24 U/L (ref 11–51)

## 2020-02-21 MED ORDER — IOHEXOL 350 MG/ML SOLN
100.0000 mL | Freq: Once | INTRAVENOUS | Status: AC | PRN
  Administered 2020-02-21: 100 mL via INTRAVENOUS

## 2020-02-21 NOTE — Addendum Note (Signed)
Addended by: [name] on: 02/21/2020 11:53 AM   Modules accepted: Orders

## 2020-02-21 NOTE — ED Notes (Signed)
Patient discharged to home per MD order. Patient in stable condition, and deemed medically cleared by ED provider for discharge. Discharge instructions reviewed with patient/family using "Teach Back"; verbalized understanding of medication education and administration, and information about follow-up care. Denies further concerns. ° °

## 2020-02-21 NOTE — ED Triage Notes (Signed)
Patient c/o SOB and left flank pain. Patient seen yesterday at [facility] clinic for same and told she has blood in urine.

## 2020-02-21 NOTE — ED Provider Notes (Signed)
[facility] Emergency Department Provider Note  ____________________________________________   First MD Initiated Contact with Patient 02/21/20 0422     (approximate)  I have reviewed the triage vital signs and the nursing notes.   HISTORY  Chief Complaint Shortness of Breath and Flank Pain    HPI Jasmine Gordon is a 34 y.o. female with below list of previous medical conditions presents to the emergency department secondary to 3-day history of intermittent dyspnea and "pelvic pressure".  Patient denies any dysuria no fever no nausea or vomiting.  Patient denies any chest pain.  patient was seen at her noted clinic where she was diagnosed with urinary tract infection for she was prescribed antibiotics and anxiety for which patient was prescribed hydroxyzine.  Patient states that she presents to the emergency department tonight because she was notified that her D-dimer was elevated.        Past Medical History:  Diagnosis Date  . Allergy   . Anemia   . Asthma   . Migraine   . Panic attack   . PTSD (post-traumatic stress disorder) 2016    Patient Active Problem List   Diagnosis Date Noted  . Genital warts 09/11/2018  . Positive TB test 09/25/2017  . Allergic contact dermatitis due to metals 05/17/2017  . Pre-diabetes 03/06/2017  . Hyperlipidemia 03/06/2017  . Allergic reaction 02/08/2017  . Urticaria due to food allergy 02/08/2017  . Vitamin D deficiency 11/08/2016  . Iron deficiency anemia due to chronic blood loss 10/03/2016  . Menorrhagia with regular cycle 06/27/2016  . Mild persistent asthma 06/17/2016  . Anxiety and depression 06/17/2016  . Morbid obesity with BMI of 40.0-44.9, adult (HCC) 06/17/2016    Past Surgical History:  Procedure Laterality Date  . APPENDECTOMY    . Plantar warts      Prior to Admission medications   Medication Sig Start Date End Date Taking? Authorizing Provider  albuterol (PROVENTIL) (2.5 MG/3ML)  0.083% nebulizer solution Take 3 mLs (2.5 mg total) by nebulization every 6 (six) hours as needed for wheezing or shortness of breath. 11/06/18   Karamalegos, Netta Neat, DO  albuterol (VENTOLIN HFA) 108 (90 Base) MCG/ACT inhaler Inhale 2 puffs into the lungs every 6 (six) hours as needed for wheezing or shortness of breath. 11/29/19   Karamalegos, Netta Neat, DO  buPROPion (WELLBUTRIN SR) 150 MG 12 hr tablet Take 1 tablet (150 mg total) by mouth 2 (two) times daily. Patient not taking: Reported on 10/30/2019 10/25/19   [name], DO  busPIRone (BUSPAR) 5 MG tablet Take 1 tablet (5 mg total) by mouth 2 (two) times daily as needed (anxiety panic). Patient not taking: Reported on 10/30/2019 10/25/19   [name], DO  Ergocalciferol (VITAMIN D2) 50 MCG (2000 UT) TABS Take by mouth.    [provider]  famotidine (PEPCID) 20 MG tablet Take 1 tablet (20 mg total) by mouth daily. 07/20/19 07/19/20  Jeanmarie Plant, MD  fluticasone (FLONASE) 50 MCG/ACT nasal spray Place 1 spray into both nostrils daily. 08/20/18 08/20/19  Orvil Feil, PA-C  nortriptyline (PAMELOR) 10 MG capsule Take 1 pill at night for one week then increase to 2 pills at night 10/10/19   [provider]  omeprazole (PRILOSEC OTC) 20 MG tablet Take 1 tablet (20 mg total) by mouth daily. Patient not taking: Reported on 01/01/2020 10/28/19 10/27/20  Loleta Rose, MD  ondansetron (ZOFRAN) 4 MG tablet Take 1 tablet (4 mg total) by mouth every  8 (eight) hours as needed. Patient not taking: Reported on 10/30/2019 09/24/19   Phineas Semen, MD    Allergies Reglan [metoclopramide]  Family History  Problem Relation Age of Onset  . Depression Mother   . Mental retardation Mother   . Bipolar disorder Mother   . Diabetes Father   . Stroke Maternal Grandfather   . Diabetes Maternal Grandfather     Social History Social History   Tobacco Use  . Smoking status: Never Smoker  . Smokeless tobacco:  Never Used  Substance Use Topics  . Alcohol use: No  . Drug use: No    Review of Systems Constitutional: No fever/chills Eyes: No visual changes. ENT: No sore throat. Cardiovascular: Denies chest pain. Respiratory: Denies shortness of breath. Gastrointestinal: No abdominal pain.  No nausea, no vomiting.  No diarrhea.  No constipation. Genitourinary: Negative for dysuria. Musculoskeletal: Negative for neck pain.  Negative for back pain. Integumentary: Negative for rash. Neurological: Negative for headaches, focal weakness or numbness.  ____________________________________________   PHYSICAL EXAM:  VITAL SIGNS: ED Triage Vitals  Enc Vitals Group     BP 02/21/20 0345 130/71     Pulse Rate 02/21/20 0345 89     Resp 02/21/20 0345 18     Temp 02/21/20 0345 98.7 F (37.1 C)     Temp Source 02/21/20 0345 Oral     SpO2 02/21/20 0345 100 %     Weight 02/21/20 0337 112.5 kg (248 lb)     Height 02/21/20 0337 1.702 m (5\' 7" )     Head Circumference --      Peak Flow --      Pain Score 02/21/20 0337 8     Pain Loc --      Pain Edu? --      Excl. in GC? --     Constitutional: Alert and oriented.  Eyes: Conjunctivae are normal.  Mouth/Throat: Patient is wearing a mask. Neck: No stridor.  No meningeal signs.   Cardiovascular: Normal rate, regular rhythm. Good peripheral circulation. Grossly normal heart sounds. Respiratory: Normal respiratory effort.  No retractions. Gastrointestinal: Soft and nontender. No distention.  Musculoskeletal: No lower extremity tenderness nor edema. No gross deformities of extremities. Neurologic:  Normal speech and language. No gross focal neurologic deficits are appreciated.  Skin:  Skin is warm, dry and intact. Psychiatric: Mood and affect are normal. Speech and behavior are normal.  ____________________________________________   LABS (all labs ordered are listed, but only abnormal results are displayed)  Labs Reviewed  COMPREHENSIVE METABOLIC  PANEL - Abnormal; Notable for the following components:      Result Value   Glucose, Bld 103 (*)    AST 12 (*)    All other components within normal limits  CBC  LIPASE, BLOOD  URINALYSIS, COMPLETE (UACMP) WITH MICROSCOPIC  POC URINE PREG, ED  POCT PREGNANCY, URINE  TROPONIN I (HIGH SENSITIVITY)  TROPONIN I (HIGH SENSITIVITY)   ____________________________________________  EKG ED ECG REPORT I, [place] N Tashi Band, the attending physician, personally viewed and interpreted this ECG.   Date: 02/21/2020  EKG Time: 3:37 AM  Rate: 87  Rhythm: Normal sinus rhythm  Axis: Normal  Intervals: Normal  ST&T Change: None  _____________________________________  RADIOLOGY I, [place] N Jomel Whittlesey, personally viewed and evaluated these images (plain radiographs) as part of my medical decision making, as well as reviewing the written report by the radiologist.  ED MD interpretation: Chest x-ray negative per radiology CT angiogram also performed which is negative.  Official  radiology report(s): DG Chest 2 View  Result Date: 02/21/2020 CLINICAL DATA:  Shortness of breath and left flank pain EXAM: CHEST - 2 VIEW COMPARISON:  10/27/2019 FINDINGS: Normal heart size and mediastinal contours. No acute infiltrate or edema. No effusion or pneumothorax. No acute osseous findings. IMPRESSION: Negative chest. Electronically Signed   By: Monte Fantasia M.D.   On: 02/21/2020 04:14   CT Angio Chest PE W and/or Wo Contrast  Result Date: 02/21/2020 CLINICAL DATA:  Shortness of breath and left flank pain EXAM: CT ANGIOGRAPHY CHEST WITH CONTRAST TECHNIQUE: Multidetector CT imaging of the chest was performed using the standard protocol during bolus administration of intravenous contrast. Multiplanar CT image reconstructions and MIPs were obtained to evaluate the vascular anatomy. CONTRAST:  120mL OMNIPAQUE IOHEXOL 350 MG/ML SOLN COMPARISON:  12/10/2018 FINDINGS: Cardiovascular: Satisfactory opacification of the pulmonary  arteries to the segmental level. No evidence of pulmonary embolism. Normal heart size. No pericardial effusion. Mediastinum/Nodes: No adenopathy or mass Lungs/Pleura: There is no edema, consolidation, effusion, or pneumothorax. Upper Abdomen: Negative Musculoskeletal: Negative Review of the MIP images confirms the above findings. IMPRESSION: Negative chest CTA. Electronically Signed   By: Monte Fantasia M.D.   On: 02/21/2020 05:31    _________ Procedures   ____________________________________________   INITIAL IMPRESSION / MDM / [place] / ED COURSE  As part of my medical decision making, I reviewed the following data within the electronic MEDICAL RECORD NUMBER  34 year old female presented with above-stated history and physical exam secondary to dyspnea and a D-dimer that was elevated when evaluated in clinic yesterday.  CT angiogram of the chest revealed no evidence of pulmonary emboli.  Patient's laboratory data unremarkable including urinalysis.  Review of the patient's UA that was performed yesterday revealed that it was not a clean-catch urine as there was many squamous cells within the urine sample.  Patient admits that she did not wipe before giving a urine sample yesterday.  ____________________________________________  FINAL CLINICAL IMPRESSION(S) / ED DIAGNOSES  Final diagnoses:  Dyspnea, unspecified type  Anxiety     MEDICATIONS GIVEN DURING THIS VISIT:  Medications  iohexol (OMNIPAQUE) 350 MG/ML injection 100 mL (100 mLs Intravenous Contrast Given 02/21/20 0503)     ED Discharge Orders    None      *Please note:  Shalamar Kimberely Mccannon was evaluated in Emergency Department on 02/21/2020 for the symptoms described in the history of present illness. She was evaluated in the context of the global COVID-19 pandemic, which necessitated consideration that the patient might be at risk for infection with the SARS-CoV-2 virus that causes COVID-19. Institutional protocols  and algorithms that pertain to the evaluation of patients at risk for COVID-19 are in a state of rapid change based on information released by regulatory bodies including the CDC and federal and state organizations. These policies and algorithms were followed during the patient's care in the ED.  Some ED evaluations and interventions may be delayed as a result of limited staffing during the pandemic.*  Note:  This document was prepared using Dragon voice recognition software and may include unintentional dictation errors.   Gregor Hams, MD 02/21/20 [phone]

## 2020-02-21 NOTE — ED Notes (Signed)
Pt in with co lower abd pain and left upper quad pain that started a few days ago but worsened yesterday. Also co shob at times, states has a hx of anxiety and felt like it may be a panic attack. Denies any recent illness or fever. Pt denies any dysuria, but does have nausea. No vomiting or diarrhea. Pt went to [facility] clinic yesterday and has several tests done, was dx with UTI, and was pending results of d dimer. Pt here for worsening symptoms. No shob noted at this time, pt is in no distress.

## 2020-02-21 NOTE — ED Notes (Signed)
Pt to CT

## 2020-03-13 ENCOUNTER — Emergency Department
Admission: EM | Admit: 2020-03-13 | Discharge: 2020-03-13 | Disposition: A | Discharge status: Home / Self Care | Payer: BC Managed Care – PPO | Attending: Student in an Organized Health Care Education/Training Program | Admitting: Student in an Organized Health Care Education/Training Program

## 2020-03-13 ENCOUNTER — Emergency Department: Payer: BC Managed Care – PPO

## 2020-03-13 ENCOUNTER — Other Ambulatory Visit: Payer: Self-pay

## 2020-03-13 DIAGNOSIS — R05 Cough: Secondary | ICD-10-CM | POA: Insufficient documentation

## 2020-03-13 DIAGNOSIS — Z20822 Contact with and (suspected) exposure to covid-19: Secondary | ICD-10-CM | POA: Diagnosis not present

## 2020-03-13 DIAGNOSIS — R0789 Other chest pain: Secondary | ICD-10-CM

## 2020-03-13 DIAGNOSIS — Z79899 Other long term (current) drug therapy: Secondary | ICD-10-CM | POA: Insufficient documentation

## 2020-03-13 LAB — RESPIRATORY PANEL BY RT PCR (FLU A&B, COVID)
Influenza A by PCR: NEGATIVE
Influenza B by PCR: NEGATIVE
SARS Coronavirus 2 by RT PCR: NEGATIVE

## 2020-03-13 LAB — BASIC METABOLIC PANEL
Anion gap: 11 (ref 5–15)
BUN: 10 mg/dL (ref 6–20)
CO2: 23 mmol/L (ref 22–32)
Calcium: 9.5 mg/dL (ref 8.9–10.3)
Chloride: 106 mmol/L (ref 98–111)
Creatinine, Ser: 0.82 mg/dL (ref 0.44–1.00)
GFR calc Af Amer: >60 mL/min (ref >60)
GFR calc non Af Amer: >60 mL/min (ref >60)
Glucose, Bld: 101 mg/dL — ABNORMAL HIGH (ref 70–99)
Potassium: 3.5 mmol/L (ref 3.5–5.1)
Sodium: 140 mmol/L (ref 135–145)

## 2020-03-13 LAB — CBC
HCT: 39 % (ref 36.0–46.0)
Hemoglobin: 12.4 g/dL (ref 12.0–15.0)
MCH: 26.8 pg (ref 26.0–34.0)
MCHC: 31.8 g/dL (ref 30.0–36.0)
MCV: 84.4 fL (ref 80.0–100.0)
Platelets: 225 10*3/uL (ref 150–400)
RBC: 4.62 MIL/uL (ref 3.87–5.11)
RDW: 12.8 % (ref 11.5–15.5)
WBC: 5.7 10*3/uL (ref 4.0–10.5)
nRBC: 0 % (ref 0.0–0.2)

## 2020-03-13 LAB — TROPONIN I (HIGH SENSITIVITY): Troponin I (High Sensitivity): <2 ng/L (ref <18)

## 2020-03-13 NOTE — ED Provider Notes (Signed)
[facility] Emergency Department Provider Note  ____________________________________________  Time seen: Approximately 3:38 PM  I have reviewed the triage vital signs and the nursing notes.   HISTORY  Chief Complaint Chest Pain and Cough    HPI Jasmine Gordon is a 34 y.o. female that presents to the emergency department for evaluation of intermittent chest discomfort for one week and Covid exposure.  She did a bunch of coughing today to try to cough up some mucus.  She feels better when she is sitting up. Patient has 2 sons and husband with Covid 31. Patient had diarrhea over the weekend. She has had body aches and rhinorrhea this week. She had a rapid negative Covid test this morning. She is not on any hormonal birth control. She does not smoke. She has been watching her 02% at home with a portable monitor, which is staying at 100%.  She used her albuterol inhaler a couple of days ago, without relief.  No fever, cough, vomiting.   Past Medical History:  Diagnosis Date  . Allergy   . Anemia   . Asthma   . Migraine   . Panic attack   . PTSD (post-traumatic stress disorder) 2016    Patient Active Problem List   Diagnosis Date Noted  . Genital warts 09/11/2018  . Positive TB test 09/25/2017  . Allergic contact dermatitis due to metals 05/17/2017  . Pre-diabetes 03/06/2017  . Hyperlipidemia 03/06/2017  . Allergic reaction 02/08/2017  . Urticaria due to food allergy 02/08/2017  . Vitamin D deficiency 11/08/2016  . Iron deficiency anemia due to chronic blood loss 10/03/2016  . Menorrhagia with regular cycle 06/27/2016  . Mild persistent asthma 06/17/2016  . Anxiety and depression 06/17/2016  . Morbid obesity with BMI of 40.0-44.9, adult ([place]) 06/17/2016    Past Surgical History:  Procedure Laterality Date  . APPENDECTOMY    . Plantar warts      Prior to Admission medications   Medication Sig Start Date End Date Taking? Authorizing Provider   albuterol (PROVENTIL) (2.5 MG/3ML) 0.083% nebulizer solution Take 3 mLs (2.5 mg total) by nebulization every 6 (six) hours as needed for wheezing or shortness of breath. 11/06/18   Karamalegos, Devonne Doughty, DO  albuterol (VENTOLIN HFA) 108 (90 Base) MCG/ACT inhaler Inhale 2 puffs into the lungs every 6 (six) hours as needed for wheezing or shortness of breath. 11/29/19   Karamalegos, Devonne Doughty, DO  buPROPion (WELLBUTRIN SR) 150 MG 12 hr tablet Take 1 tablet (150 mg total) by mouth 2 (two) times daily. Patient not taking: Reported on 10/30/2019 10/25/19   [name], DO  busPIRone (BUSPAR) 5 MG tablet Take 1 tablet (5 mg total) by mouth 2 (two) times daily as needed (anxiety panic). Patient not taking: Reported on 10/30/2019 10/25/19   [name], DO  Ergocalciferol (VITAMIN D2) 50 MCG (2000 UT) TABS Take by mouth.    [provider]  famotidine (PEPCID) 20 MG tablet Take 1 tablet (20 mg total) by mouth daily. 07/20/19 07/19/20  Schuyler Amor, MD  fluticasone (FLONASE) 50 MCG/ACT nasal spray Place 1 spray into both nostrils daily. 08/20/18 08/20/19  Lannie Fields, PA-C  nortriptyline (PAMELOR) 10 MG capsule Take 1 pill at night for one week then increase to 2 pills at night 10/10/19   [provider]  omeprazole (PRILOSEC OTC) 20 MG tablet Take 1 tablet (20 mg total) by mouth daily. Patient not taking: Reported on 01/01/2020 10/28/19 10/27/20  Karma Greaser,  Kandee Keen, MD  ondansetron (ZOFRAN) 4 MG tablet Take 1 tablet (4 mg total) by mouth every 8 (eight) hours as needed. Patient not taking: Reported on 10/30/2019 09/24/19   Phineas Semen, MD    Allergies Reglan [metoclopramide]  Family History  Problem Relation Age of Onset  . Depression Mother   . Mental retardation Mother   . Bipolar disorder Mother   . Diabetes Father   . Stroke Maternal Grandfather   . Diabetes Maternal Grandfather     Social History Social History   Tobacco Use  . Smoking status:  Never Smoker  . Smokeless tobacco: Never Used  Substance Use Topics  . Alcohol use: No  . Drug use: No     Review of Systems  Constitutional: No fever/chills ENT: Positive for rhinorrhea. Cardiovascular: Positive for intermittent chest pain. Respiratory: No cough. No SOB. Gastrointestinal: No abdominal pain.  No nausea, no vomiting.  Musculoskeletal: Positive for body aches. Skin: Negative for rash, abrasions, lacerations, ecchymosis. Neurological: Negative for headaches, numbness or tingling   ____________________________________________   PHYSICAL EXAM:  VITAL SIGNS: ED Triage Vitals  Enc Vitals Group     BP 03/13/20 1433 (!) 145/79     Pulse Rate 03/13/20 1433 100     Resp 03/13/20 1433 18     Temp 03/13/20 1433 98.3 F (36.8 C)     Temp Source 03/13/20 1433 Oral     SpO2 03/13/20 1433 100 %     Weight 03/13/20 1431 244 lb (110.7 kg)     Height 03/13/20 1431 5\' 6"  (1.676 m)     Head Circumference --      Peak Flow --      Pain Score 03/13/20 1431 7     Pain Loc --      Pain Edu? --      Excl. in GC? --      Constitutional: Alert and oriented. Well appearing and in no acute distress. Eyes: Conjunctivae are normal. PERRL. EOMI. Head: Atraumatic. ENT:      Ears:      Nose: No congestion/rhinnorhea.      Mouth/Throat: Mucous membranes are moist.  Neck: No stridor.   Cardiovascular: Normal rate, regular rhythm.  Good peripheral circulation. Respiratory: Normal respiratory effort without tachypnea or retractions. Lungs CTAB. Good air entry to the bases with no decreased or absent breath sounds. Gastrointestinal: Bowel sounds 4 quadrants. Soft and nontender to palpation. No guarding or rigidity. No palpable masses. No distention.  Musculoskeletal: Full range of motion to all extremities. No gross deformities appreciated. Neurologic:  Normal speech and language. No gross focal neurologic deficits are appreciated.  Skin:  Skin is warm, dry and intact. No rash  noted. Psychiatric: Mood and affect are normal. Speech and behavior are normal. Patient exhibits appropriate insight and judgement.   ____________________________________________   LABS (all labs ordered are listed, but only abnormal results are displayed)  Labs Reviewed  BASIC METABOLIC PANEL - Abnormal; Notable for the following components:      Result Value   Glucose, Bld 101 (*)    All other components within normal limits  RESPIRATORY PANEL BY RT PCR (FLU A&B, COVID)  CBC  TROPONIN I (HIGH SENSITIVITY)  TROPONIN I (HIGH SENSITIVITY)   ____________________________________________  EKG  SR ____________________________________________  RADIOLOGY 03/15/20, personally viewed and evaluated these images (plain radiographs) as part of my medical decision making, as well as reviewing the written report by the radiologist.  DG Chest 1 View  Result  Date: 03/13/2020 CLINICAL DATA:  Pt c/o chest pain today with cough EXAM: CHEST  1 VIEW COMPARISON:  Chest radiograph 02/21/2020 FINDINGS: The heart size and mediastinal contours are within normal limits. The lungs are clear. No pneumothorax or large pleural effusion. No acute finding in the visualized skeleton. IMPRESSION: No acute cardiopulmonary finding. Electronically Signed   By: Emmaline Kluver M.D.   On: 03/13/2020 15:59    ____________________________________________    PROCEDURES  Procedure(s) performed:    Procedures    Medications - No data to display   ____________________________________________   INITIAL IMPRESSION / ASSESSMENT AND PLAN / ED COURSE  Pertinent labs & imaging results that were available during my care of the patient were reviewed by me and considered in my medical decision making (see chart for details).  Review of the [place] CSRS was performed in accordance of the [facility] prior to dispensing any controlled drugs.   Differential diagnosis includes, but is not limited to, ACS, URI, Covid,  aortic dissection, pulmonary embolism, cardiac tamponade, pneumothorax, pneumonia, pericarditis, myocarditis, GI-related causes including esophagitis/gastritis, and musculoskeletal chest wall pain.     Patient presented to the emergency department for evaluation of intermittent chest discomfort for one week and Covid exposure. Chest xray negative for acute abnormalities. NSR on EKG. Troponin WNL. Covid test is pending. Patient is to follow up with PCP as directed. Patient is given ED precautions to return to the ED for any worsening or new symptoms.   Yeraldine Aquila Menzie was evaluated in Emergency Department on 03/13/2020 for the symptoms described in the history of present illness. She was evaluated in the context of the global COVID-19 pandemic, which necessitated consideration that the patient might be at risk for infection with the SARS-CoV-2 virus that causes COVID-19. Institutional protocols and algorithms that pertain to the evaluation of patients at risk for COVID-19 are in a state of rapid change based on information released by regulatory bodies including the CDC and federal and state organizations. These policies and algorithms were followed during the patient's care in the ED.  ____________________________________________  FINAL CLINICAL IMPRESSION(S) / ED DIAGNOSES  Final diagnoses:  Atypical chest pain  Exposure to COVID-19 virus      NEW MEDICATIONS STARTED DURING THIS VISIT:  ED Discharge Orders    None          This chart was dictated using voice recognition software/Dragon. Despite best efforts to proofread, errors can occur which can change the meaning. Any change was purely unintentional.    Enid Derry, PA-C 03/13/20 2055    Willy Eddy, MD 03/13/20 2102

## 2020-03-13 NOTE — ED Notes (Addendum)
Pt states having diarrhea, a cough, and chest pain. Pt states she is feeling shob but denies fevers, n/v.

## 2020-03-13 NOTE — ED Triage Notes (Signed)
Pt c/o chest pain today with cough, "I feel like it is something different then anxiety". States her son tested positive for covid on Monday and she went this morning to be tested and is pending results. Pt is in NAD. speaking in complete sentences.

## 2020-04-30 DIAGNOSIS — H5213 Myopia, bilateral: Secondary | ICD-10-CM | POA: Diagnosis not present

## 2020-05-15 DIAGNOSIS — H5203 Hypermetropia, bilateral: Secondary | ICD-10-CM | POA: Diagnosis not present

## 2020-05-27 ENCOUNTER — Inpatient Hospital Stay: Payer: BC Managed Care – PPO | Attending: Oncology

## 2020-05-27 ENCOUNTER — Other Ambulatory Visit: Payer: Self-pay

## 2020-05-27 DIAGNOSIS — J45909 Unspecified asthma, uncomplicated: Secondary | ICD-10-CM | POA: Diagnosis not present

## 2020-05-27 DIAGNOSIS — Z79899 Other long term (current) drug therapy: Secondary | ICD-10-CM | POA: Insufficient documentation

## 2020-05-27 DIAGNOSIS — Z9049 Acquired absence of other specified parts of digestive tract: Secondary | ICD-10-CM | POA: Insufficient documentation

## 2020-05-27 DIAGNOSIS — D500 Iron deficiency anemia secondary to blood loss (chronic): Secondary | ICD-10-CM | POA: Diagnosis present

## 2020-05-27 LAB — CBC WITH DIFFERENTIAL/PLATELET
Abs Immature Granulocytes: 0.01 10*3/uL (ref 0.00–0.07)
Basophils Absolute: 0 10*3/uL (ref 0.0–0.1)
Basophils Relative: 0 %
Eosinophils Absolute: 0.1 10*3/uL (ref 0.0–0.5)
Eosinophils Relative: 2 %
HCT: 36 % (ref 36.0–46.0)
Hemoglobin: 11.4 g/dL — ABNORMAL LOW (ref 12.0–15.0)
Immature Granulocytes: 0 %
Lymphocytes Relative: 46 %
Lymphs Abs: 1.9 10*3/uL (ref 0.7–4.0)
MCH: 26 pg (ref 26.0–34.0)
MCHC: 31.7 g/dL (ref 30.0–36.0)
MCV: 82.2 fL (ref 80.0–100.0)
Monocytes Absolute: 0.2 10*3/uL (ref 0.1–1.0)
Monocytes Relative: 5 %
Neutro Abs: 1.9 10*3/uL (ref 1.7–7.7)
Neutrophils Relative %: 47 %
Platelets: 236 10*3/uL (ref 150–400)
RBC: 4.38 MIL/uL (ref 3.87–5.11)
RDW: 13.2 % (ref 11.5–15.5)
WBC: 4.2 10*3/uL (ref 4.0–10.5)
nRBC: 0 % (ref 0.0–0.2)

## 2020-05-27 LAB — IRON AND TIBC
Iron: 45 ug/dL (ref 28–170)
Saturation Ratios: 15 % (ref 10.4–31.8)
TIBC: 308 ug/dL (ref 250–450)
UIBC: 263 ug/dL

## 2020-05-27 LAB — RETIC PANEL
Immature Retic Fract: 7.5 % (ref 2.3–15.9)
RBC.: 4.33 MIL/uL (ref 3.87–5.11)
Retic Count, Absolute: 59.3 10*3/uL (ref 19.0–186.0)
Retic Ct Pct: 1.4 % (ref 0.4–3.1)
Reticulocyte Hemoglobin: 30.5 pg (ref >27.9)

## 2020-05-27 LAB — FERRITIN: Ferritin: 23 ng/mL (ref 11–307)

## 2020-05-27 LAB — FOLATE: Folate: 11.2 ng/mL (ref >5.9)

## 2020-05-27 LAB — VITAMIN B12: Vitamin B-12: 232 pg/mL (ref 180–914)

## 2020-05-28 ENCOUNTER — Encounter: Payer: Self-pay | Admitting: Oncology

## 2020-05-29 ENCOUNTER — Other Ambulatory Visit: Payer: Self-pay

## 2020-05-29 ENCOUNTER — Inpatient Hospital Stay (HOSPITAL_BASED_OUTPATIENT_CLINIC_OR_DEPARTMENT_OTHER): Payer: BC Managed Care – PPO | Admitting: Oncology

## 2020-05-29 ENCOUNTER — Encounter: Payer: Self-pay | Admitting: Oncology

## 2020-05-29 VITALS — BP 129/89 | HR 96 | Temp 96.6°F | Resp 18 | Wt 250.1 lb

## 2020-05-29 DIAGNOSIS — D500 Iron deficiency anemia secondary to blood loss (chronic): Secondary | ICD-10-CM | POA: Diagnosis not present

## 2020-05-29 NOTE — Progress Notes (Signed)
[facility]  Clinic day:  05/29/2020  Chief Complaint: Jasmine Gordon is a 34 y.o. female with iron deficiency anemia who is seen for 15 month reassessment and re-initiation of IV iron.  PERTINENT HEMATOLOGY HISTORY She followed up with Dr.Corcoran. switched care to me on 01/29/2019  iron deficiency anemia likely due to heavy menses.  Menses improved after IUD placement.  Diet appears good.  She denies any melena, hematochezia, or hematuria.  Urinalysis reveals no blood.  She received Venofer weekly x 3 (04/27/2017 - 05/10/2017).  CBC on 09/06/2017 revealed a hematocrit of 35.8, hemoglobin 11.9 and MCV 81.6. Chest CT angiogram on 07/26/2018 revealed no evidence of pulmonary embolism.  There was no evidence of active pulmonary disease.  INTERVAL HISTORY Jasmine Gordon is a 34 y.o. female who has above history reviewed by me today presents for follow up visit for management of iron deficiency anemia.  Patient reports feeling well. She just finished menstrual period  Her menstrual period has been light since IUD has been removed.  She has no new complaints.  Review of Systems  Constitutional: Negative for appetite change, chills, fatigue and fever.  HENT:   Negative for hearing loss and voice change.   Eyes: Negative for eye problems.  Respiratory: Negative for chest tightness and cough.   Cardiovascular: Negative for chest pain.  Gastrointestinal: Negative for abdominal distention, abdominal pain and blood in stool.  Endocrine: Negative for hot flashes.  Genitourinary: Negative for difficulty urinating and frequency.   Musculoskeletal: Negative for arthralgias.  Skin: Negative for itching and rash.  Neurological: Negative for extremity weakness.  Hematological: Negative for adenopathy.  Psychiatric/Behavioral: Negative for confusion.     Past Medical History:  Diagnosis Date  . Allergy   . Anemia   . Asthma   . Migraine   . Panic  attack   . PTSD (post-traumatic stress disorder) 2016    Past Surgical History:  Procedure Laterality Date  . APPENDECTOMY    . Plantar warts      Family History  Problem Relation Age of Onset  . Depression Mother   . Mental retardation Mother   . Bipolar disorder Mother   . Diabetes Father   . Stroke Maternal Grandfather   . Diabetes Maternal Grandfather    Social History   Socioeconomic History  . Marital status: Married    Spouse name: Not on file  . Number of children: Not on file  . Years of education: Not on file  . Highest education level: Not on file  Occupational History  . Not on file  Tobacco Use  . Smoking status: Never Smoker  . Smokeless tobacco: Never Used  Vaping Use  . Vaping Use: Never used  Substance and Sexual Activity  . Alcohol use: No  . Drug use: No  . Sexual activity: Yes    Birth control/protection: Condom  Other Topics Concern  . Not on file  Social History Narrative  . Not on file   Social Determinants of Health   Financial Resource Strain:   . Difficulty of Paying Living Expenses:   Food Insecurity:   . Worried About Programme researcher, broadcasting/film/video in the Last Year:   . Barista in the Last Year:   Transportation Needs:   . Freight forwarder (Medical):   Marland Kitchen Lack of Transportation (Non-Medical):   Physical Activity:   . Days of Exercise per Week:   . Minutes of Exercise per  Session:   Stress:   . Feeling of Stress :   Social Connections:   . Frequency of Communication with Friends and Family:   . Frequency of Social Gatherings with Friends and Family:   . Attends Religious Services:   . Active Member of Clubs or Organizations:   . Attends Banker Meetings:   Marland Kitchen Marital Status:   Intimate Partner Violence:   . Fear of Current or Ex-Partner:   . Emotionally Abused:   Marland Kitchen Physically Abused:   . Sexually Abused:   She is working online to obtain a business administration degree.  She is currently the day shift  Production designer, theatre/television/film for [employer].  She works 10 hours/day.  She is off 3 days/week.  She has 2 children with ADHD. She is going to the gym 5 days/week.  The patient is alone today.  Allergies:  Allergies  Allergen Reactions  . Reglan [Metoclopramide] Hives and Shortness Of Breath    Current Medications: Current Outpatient Medications  Medication Sig Dispense Refill  . albuterol (PROVENTIL) (2.5 MG/3ML) 0.083% nebulizer solution Take 3 mLs (2.5 mg total) by nebulization every 6 (six) hours as needed for wheezing or shortness of breath. 150 mL 1  . albuterol (VENTOLIN HFA) 108 (90 Base) MCG/ACT inhaler Inhale 2 puffs into the lungs every 6 (six) hours as needed for wheezing or shortness of breath. 8 g 3  . buPROPion (WELLBUTRIN SR) 150 MG 12 hr tablet Take by mouth. (Patient not taking: Reported on 05/29/2020)    . busPIRone (BUSPAR) 5 MG tablet Take by mouth. (Patient not taking: Reported on 05/29/2020)    . fluticasone (FLONASE) 50 MCG/ACT nasal spray Place 1 spray into both nostrils daily. 16 g 2  . nortriptyline (PAMELOR) 10 MG capsule Take 1 pill at night for one week then increase to 2 pills at night (Patient not taking: Reported on 05/29/2020)    . ondansetron (ZOFRAN) 4 MG tablet Take 1 tablet (4 mg total) by mouth every 8 (eight) hours as needed. (Patient not taking: Reported on 10/30/2019) 20 tablet 0   No current facility-administered medications for this visit.      Physical Exam: ECOG 0 Blood pressure 129/89, pulse 96, temperature (!) 96.6 F (35.9 C), resp. rate 18, weight 250 lb 1.6 oz (113.4 kg).  Physical Exam Constitutional:      General: She is not in acute distress.    Appearance: She is not diaphoretic.     Comments: Obese  HENT:     Head: Normocephalic and atraumatic.     Nose: Nose normal.     Mouth/Throat:     Pharynx: No oropharyngeal exudate.  Eyes:     General: No scleral icterus.    Pupils: Pupils are equal, round, and reactive to light.  Cardiovascular:     Rate and  Rhythm: Normal rate and regular rhythm.     Heart sounds: No murmur heard.   Pulmonary:     Effort: Pulmonary effort is normal. No respiratory distress.     Breath sounds: No rales.  Chest:     Chest wall: No tenderness.  Abdominal:     General: There is no distension.     Palpations: Abdomen is soft.     Tenderness: There is no abdominal tenderness.  Musculoskeletal:        General: Normal range of motion.     Cervical back: Normal range of motion and neck supple.  Skin:    General: Skin is warm  and dry.     Findings: No erythema.  Neurological:     Mental Status: She is alert and oriented to person, place, and time.     Cranial Nerves: No cranial nerve deficit.     Motor: No abnormal muscle tone.     Coordination: Coordination normal.  Psychiatric:        Mood and Affect: Affect normal.      Appointment on 05/27/2020  Component Date Value Ref Range Status  . Vitamin B-12 05/27/2020 232  180 - 914 pg/mL Final   Comment: (NOTE) This assay is not validated for testing neonatal or myeloproliferative syndrome specimens for Vitamin B12 levels. Performed at [facility], [address] [place], [place] [address]   . Folate 05/27/2020 11.2  >5.9 ng/mL Final   Performed at [facility], [address] [place], [place] [address]  . Retic Ct Pct 05/27/2020 1.4  0.4 - 3.1 % Final  . RBC. 05/27/2020 4.33  3.87 - 5.11 MIL/uL Final  . Retic Count, Absolute 05/27/2020 59.3  19.0 - 186.0 K/uL Final  . Immature Retic Fract 05/27/2020 7.5  2.3 - 15.9 % Final  . Reticulocyte Hemoglobin 05/27/2020 30.5  >27.9 pg Final   Performed at [facility], [address] [place], [place] [address]  . Iron 05/27/2020 45  28 - 170 ug/dL Final  . TIBC 91/47/829507/05/2020 308  250 - 450 ug/dL Final  . Saturation Ratios 05/27/2020 15  10.4 - 31.8 % Final  . UIBC 05/27/2020 263  ug/dL Final   Performed at [facility], [address] [place], [place] [address]  . Ferritin  05/27/2020 23  11 - 307 ng/mL Final   Performed at [facility], [address] [place], [place] [address]  . WBC 05/27/2020 4.2  4.0 - 10.5 K/uL Final  . RBC 05/27/2020 4.38  3.87 - 5.11 MIL/uL Final  . Hemoglobin 05/27/2020 11.4* 12.0 - 15.0 g/dL Final  . HCT 46/96/295207/05/2020 36.0  36 - 46 % Final  . MCV 05/27/2020 82.2  80.0 - 100.0 fL Final  . MCH 05/27/2020 26.0  26.0 - 34.0 pg Final  . MCHC 05/27/2020 31.7  30.0 - 36.0 g/dL Final  . RDW 84/13/244007/05/2020 13.2  11.5 - 15.5 % Final  . Platelets 05/27/2020 236  150 - 400 K/uL Final  . nRBC 05/27/2020 0.0  0.0 - 0.2 % Final  . Neutrophils Relative % 05/27/2020 47  % Final  . Neutro Abs 05/27/2020 1.9  1.7 - 7.7 K/uL Final  . Lymphocytes Relative 05/27/2020 46  % Final  . Lymphs Abs 05/27/2020 1.9  0.7 - 4.0 K/uL Final  . Monocytes Relative 05/27/2020 5  % Final  . Monocytes Absolute 05/27/2020 0.2  0 - 1 K/uL Final  . Eosinophils Relative 05/27/2020 2  % Final  . Eosinophils Absolute 05/27/2020 0.1  0 - 0 K/uL Final  . Basophils Relative 05/27/2020 0  % Final  . Basophils Absolute 05/27/2020 0.0  0 - 0 K/uL Final  . Immature Granulocytes 05/27/2020 0  % Final  . Abs Immature Granulocytes 05/27/2020 0.01  0.00 - 0.07 K/uL Final   Performed at [facility], [address] [place], [place] [address]    Assessment:    1. Iron deficiency anemia due to chronic blood loss    Labs reviewed and discussed with patient. Patient is slightly anemia with hemoglobin of 11.4, iron saturation is 15, ferritin has decreased to 23  compared to last visit.  Normal folate level.  Vitamin B12 level is 232 Mild anemia probably secondary to menstrual period weight loss. She cannot tolerate oral iron supplementation. I recommend patient proceed with IV Venofer treatment 200 mg x 1.   RTC in 6 months for MD assessment, labs (CBVC with diff, ferritin- day before).  Patient prefers virtual visit. We spent sufficient time to discuss many aspect of  care, questions were answered to patient's satisfaction.  [name], MD, PhD Hematology Oncology [facility] Pager- [phone] 05/29/2020

## 2020-05-29 NOTE — Progress Notes (Signed)
Patient here for follow up. No new concerns voiced.  °

## 2020-06-01 ENCOUNTER — Telehealth: Payer: Self-pay

## 2020-06-01 NOTE — Telephone Encounter (Signed)
Have pt take UPT if sex active. Follow sx this cycle and f/u if sx recur again next month or if bleeding becomes heavy.

## 2020-06-01 NOTE — Telephone Encounter (Signed)
Pt aware. She wants to ask if her HPV might have something to do.

## 2020-06-01 NOTE — Telephone Encounter (Signed)
HPV not related at all.

## 2020-06-01 NOTE — Telephone Encounter (Signed)
Pt calling; having random spotting; cycle stopped about 3d ago; mild cramping.  Does she need to come in?  [phone]

## 2020-06-02 ENCOUNTER — Encounter: Payer: Self-pay | Admitting: Obstetrics and Gynecology

## 2020-06-02 NOTE — Telephone Encounter (Signed)
Pt aware. Mentioned UPT taken this morning-NEG. Will f/u.

## 2020-06-03 NOTE — Telephone Encounter (Signed)
Patient is scheduled for 06/05/20 at 3:10 with JEG

## 2020-06-04 ENCOUNTER — Other Ambulatory Visit: Payer: Self-pay

## 2020-06-04 ENCOUNTER — Inpatient Hospital Stay: Payer: BC Managed Care – PPO

## 2020-06-04 VITALS — BP 116/72 | HR 102 | Temp 96.0°F | Resp 18

## 2020-06-04 DIAGNOSIS — D500 Iron deficiency anemia secondary to blood loss (chronic): Secondary | ICD-10-CM

## 2020-06-04 MED ORDER — SODIUM CHLORIDE 0.9 % IV SOLN
Freq: Once | INTRAVENOUS | Status: AC
  Filled 2020-06-04: qty 250

## 2020-06-04 MED ORDER — IRON SUCROSE 20 MG/ML IV SOLN
200.0000 mg | Freq: Once | INTRAVENOUS | Status: AC
  Administered 2020-06-04: 200 mg via INTRAVENOUS
  Filled 2020-06-04: qty 10

## 2020-06-05 ENCOUNTER — Ambulatory Visit: Payer: Medicaid Other | Admitting: Advanced Practice Midwife

## 2020-06-12 ENCOUNTER — Ambulatory Visit: Payer: Medicaid Other | Admitting: Advanced Practice Midwife

## 2020-08-13 DIAGNOSIS — Z20822 Contact with and (suspected) exposure to covid-19: Secondary | ICD-10-CM | POA: Diagnosis not present

## 2020-10-27 ENCOUNTER — Other Ambulatory Visit: Payer: Self-pay | Admitting: *Deleted

## 2020-10-27 DIAGNOSIS — E559 Vitamin D deficiency, unspecified: Secondary | ICD-10-CM

## 2020-10-28 ENCOUNTER — Other Ambulatory Visit: Payer: Self-pay

## 2020-10-28 ENCOUNTER — Other Ambulatory Visit: Payer: BC Managed Care – PPO

## 2020-10-29 LAB — VITAMIN D 25 HYDROXY (VIT D DEFICIENCY, FRACTURES): Vit D, 25-Hydroxy: 16 ng/mL — ABNORMAL LOW (ref 30–100)

## 2020-11-04 ENCOUNTER — Ambulatory Visit (INDEPENDENT_AMBULATORY_CARE_PROVIDER_SITE_OTHER): Payer: Medicaid Other | Admitting: Family Medicine

## 2020-11-04 ENCOUNTER — Other Ambulatory Visit: Payer: Self-pay

## 2020-11-04 ENCOUNTER — Encounter: Payer: Self-pay | Admitting: Family Medicine

## 2020-11-04 VITALS — BP 119/81 | HR 107 | Temp 97.7°F | Resp 16 | Ht 67.0 in | Wt 252.6 lb

## 2020-11-04 DIAGNOSIS — F32A Depression, unspecified: Secondary | ICD-10-CM

## 2020-11-04 DIAGNOSIS — Z0000 Encounter for general adult medical examination without abnormal findings: Secondary | ICD-10-CM | POA: Diagnosis not present

## 2020-11-04 DIAGNOSIS — J4530 Mild persistent asthma, uncomplicated: Secondary | ICD-10-CM

## 2020-11-04 DIAGNOSIS — Z23 Encounter for immunization: Secondary | ICD-10-CM | POA: Diagnosis not present

## 2020-11-04 DIAGNOSIS — J3089 Other allergic rhinitis: Secondary | ICD-10-CM | POA: Insufficient documentation

## 2020-11-04 DIAGNOSIS — F419 Anxiety disorder, unspecified: Secondary | ICD-10-CM | POA: Diagnosis not present

## 2020-11-04 DIAGNOSIS — R7303 Prediabetes: Secondary | ICD-10-CM | POA: Diagnosis not present

## 2020-11-04 DIAGNOSIS — E7800 Pure hypercholesterolemia, unspecified: Secondary | ICD-10-CM | POA: Diagnosis not present

## 2020-11-04 DIAGNOSIS — Z1159 Encounter for screening for other viral diseases: Secondary | ICD-10-CM | POA: Diagnosis not present

## 2020-11-04 MED ORDER — MONTELUKAST SODIUM 10 MG PO TABS: 10.0000 mg | ORAL_TABLET | Freq: Every day | ORAL | 3 refills | Status: DC

## 2020-11-04 MED ORDER — ALBUTEROL SULFATE (2.5 MG/3ML) 0.083% IN NEBU: 2.5000 mg | INHALATION_SOLUTION | Freq: Four times a day (QID) | RESPIRATORY_TRACT | 1 refills | Status: DC | PRN

## 2020-11-04 MED ORDER — ALBUTEROL SULFATE HFA 108 (90 BASE) MCG/ACT IN AERS: 2.0000 | INHALATION_SPRAY | Freq: Four times a day (QID) | RESPIRATORY_TRACT | 3 refills | Status: DC | PRN

## 2020-11-04 MED ORDER — FLUTICASONE PROPIONATE 50 MCG/ACT NA SUSP: 2.0000 | Freq: Every day | NASAL | 3 refills | Status: DC

## 2020-11-04 NOTE — Patient Instructions (Addendum)
Thank you for coming to the office today.  Try the Nature's Made Brand - Vitamin D3 5,000 daily for 3 months then reduce back down to 2,000 daily.  Rest of labs drawn today.  Flu Shot today.  Upcoming Hematology in January  Upcoming pap smear repeat in 2022  Start Montelukast (Singulair) allergy/asthma pill once nightly. Every night.  Refilled albuterol inhaler and solution.   Please schedule a Follow-up Appointment to: Return in about 6 months (around 05/05/2021) for 6 month follow-up PreDM A1c, asthma, anxiety.  If you have any other questions or concerns, please feel free to call the office or send a message through MyChart. You may also schedule an earlier appointment if necessary.  Additionally, you may be receiving a survey about your experience at our office within a few days to 1 week by e-mail or mail. We value your feedback.  [name], DO [facility], [place]

## 2020-11-04 NOTE — Progress Notes (Signed)
Subjective:    Patient ID: Jasmine Gordon, female    DOB: 1986-10-09, 34 y.o.   MRN: [id number]  Jasmine Gordon is a 34 y.o. female presenting on 11/04/2020 for Annual Exam   HPI   Here for Annual Physical and Lab Review.  Hyperlipidemia Last lab 10/2019, LDL >150, HDL mild low, normal TG Improving diet goal  Vitamin D deficiency Previously If feeling better, stopped taking Vitamin D Had headache with high dose vitamin D, stopped med Last lab mild low 16, some improved She will restart her Vitamin D  Anxiety Last visit 10/25/19. She never started her anxiety medications Buproprion or Buspar. Has come off of all anxiety mood medications Previous episodes described anxiety panic with sweating, heart racing tachycardia Improved overall  Environmental / Seasonal Allergies Using Flonase, need re order New living location, older house may have allergen Asks about allergy meds   Depression screen [facility] 2/9 11/04/2020 01/01/2020 10/25/2019  Decreased Interest 0 3 2  Down, Depressed, Hopeless 0 3 2  PHQ - 2 Score 0 6 4  Altered sleeping - 3 0  Tired, decreased energy - 1 2  Change in appetite - 1 0  Feeling bad or failure about yourself  - 1 3  Trouble concentrating - 3 2  Moving slowly or fidgety/restless - 0 0  Suicidal thoughts - 0 0  PHQ-9 Score - 15 11  Difficult doing work/chores - Extremely dIfficult Very difficult  Some recent data might be hidden   GAD 7 : Generalized Anxiety Score 01/01/2020 10/25/2019 08/16/2019 05/28/2019  Nervous, Anxious, on Edge 3 3 2 2   Control/stop worrying 3 3 1 1   Worry too much - different things 3 3 1 2   Trouble relaxing 3 2 1 3   Restless 1 3 0 2  Easily annoyed or irritable 3 2 1 1   Afraid - awful might happen 3 3 2 3   Total GAD 7 Score 19 19 8 14   Anxiety Difficulty Very difficult Extremely difficult Somewhat difficult Somewhat difficult      Past Medical History:  Diagnosis Date  . Allergy   . Anemia   . Asthma    . Migraine   . Panic attack   . PTSD (post-traumatic stress disorder) 2016   Past Surgical History:  Procedure Laterality Date  . APPENDECTOMY    . Plantar warts     Social History   Socioeconomic History  . Marital status: Married    Spouse name: Not on file  . Number of children: Not on file  . Years of education: Not on file  . Highest education level: Not on file  Occupational History  . Not on file  Tobacco Use  . Smoking status: Never Smoker  . Smokeless tobacco: Never Used  Vaping Use  . Vaping Use: Never used  Substance and Sexual Activity  . Alcohol use: No  . Drug use: No  . Sexual activity: Yes    Birth control/protection: Condom  Other Topics Concern  . Not on file  Social History Narrative  . Not on file   Social Determinants of Health   Financial Resource Strain: Not on file  Food Insecurity: Not on file  Transportation Needs: Not on file  Physical Activity: Not on file  Stress: Not on file  Social Connections: Not on file  Intimate Partner Violence: Not on file   Family History  Problem Relation Age of Onset  . Depression Mother   . Mental retardation Mother   .  Bipolar disorder Mother   . Diabetes Father   . Stroke Maternal Grandfather   . Diabetes Maternal Grandfather    Current Outpatient Medications on File Prior to Visit  Medication Sig  . nortriptyline (PAMELOR) 10 MG capsule Take 10 mg by mouth at bedtime.  . ondansetron (ZOFRAN) 4 MG tablet Take 1 tablet (4 mg total) by mouth every 8 (eight) hours as needed.   No current facility-administered medications on file prior to visit.    Review of Systems  Constitutional: Negative for activity change, appetite change, chills, diaphoresis, fatigue and fever.  HENT: Negative for congestion and hearing loss.   Eyes: Negative for visual disturbance.  Respiratory: Negative for cough, chest tightness, shortness of breath and wheezing.   Cardiovascular: Negative for chest pain, palpitations  and leg swelling.  Gastrointestinal: Negative for abdominal pain, constipation, diarrhea, nausea and vomiting.  Genitourinary: Negative for dysuria, frequency and hematuria.  Musculoskeletal: Negative for arthralgias and neck pain.  Skin: Negative for rash.  Allergic/Immunologic: Positive for environmental allergies.  Neurological: Negative for dizziness, weakness, light-headedness, numbness and headaches.  Hematological: Negative for adenopathy.  Psychiatric/Behavioral: Negative for behavioral problems, dysphoric mood and sleep disturbance.   Per HPI unless specifically indicated above     Objective:    BP 119/81   Pulse (!) 107   Temp 97.7 F (36.5 C) (Temporal)   Resp 16   Ht 5\' 7"  (1.702 m)   Wt 252 lb 9.6 oz (114.6 kg)   SpO2 100%   BMI 39.56 kg/m   Wt Readings from Last 3 Encounters:  11/04/20 252 lb 9.6 oz (114.6 kg)  05/29/20 250 lb 1.6 oz (113.4 kg)  03/13/20 244 lb (110.7 kg)    Physical Exam Vitals and nursing note reviewed.  Constitutional:      General: She is not in acute distress.    Appearance: She is well-developed and well-nourished. She is obese. She is not diaphoretic.     Comments: Well-appearing, comfortable, cooperative  HENT:     Head: Normocephalic and atraumatic.     Mouth/Throat:     Mouth: Oropharynx is clear and moist.  Eyes:     General:        Right eye: No discharge.        Left eye: No discharge.     Extraocular Movements: EOM normal.     Conjunctiva/sclera: Conjunctivae normal.     Pupils: Pupils are equal, round, and reactive to light.  Neck:     Thyroid: No thyromegaly.  Cardiovascular:     Rate and Rhythm: Normal rate and regular rhythm.     Pulses: Intact distal pulses.     Heart sounds: Normal heart sounds. No murmur heard.   Pulmonary:     Effort: Pulmonary effort is normal. No respiratory distress.     Breath sounds: Normal breath sounds. No wheezing or rales.  Abdominal:     General: Bowel sounds are normal. There is  no distension.     Palpations: Abdomen is soft. There is no mass.     Tenderness: There is no abdominal tenderness.  Musculoskeletal:        General: No tenderness or edema. Normal range of motion.     Cervical back: Normal range of motion and neck supple.     Comments: Upper / Lower Extremities: - Normal muscle tone, strength bilateral upper extremities 5/5, lower extremities 5/5  Lymphadenopathy:     Cervical: No cervical adenopathy.  Skin:    General: Skin  is warm and dry.     Findings: No erythema or rash.  Neurological:     Mental Status: She is alert and oriented to person, place, and time.     Comments: Distal sensation intact to light touch all extremities  Psychiatric:        Mood and Affect: Mood and affect normal.        Behavior: Behavior normal.     Comments: Well groomed, good eye contact, normal speech and thoughts        Results for orders placed or performed in visit on 10/27/20  VITAMIN D 25 Hydroxy (Vit-D Deficiency, Fractures)  Result Value Ref Range   Vit D, 25-Hydroxy 16 (L) 30 - 100 ng/mL      Assessment & Plan:   Problem List Items Addressed This Visit    Pre-diabetes   Relevant Orders   Hemoglobin A1c   Morbid obesity (HCC)   Relevant Orders   COMPLETE METABOLIC PANEL WITH GFR   Lipid panel   Mild persistent asthma without complication   Relevant Medications   albuterol (PROVENTIL) (2.5 MG/3ML) 0.083% nebulizer solution   albuterol (VENTOLIN HFA) 108 (90 Base) MCG/ACT inhaler   montelukast (SINGULAIR) 10 MG tablet   Hyperlipidemia   Relevant Orders   Lipid panel   TSH   Environmental and seasonal allergies   Relevant Medications   montelukast (SINGULAIR) 10 MG tablet   fluticasone (FLONASE) 50 MCG/ACT nasal spray   Anxiety and depression    Other Visit Diagnoses    Annual physical exam    -  Primary   Relevant Orders   Hemoglobin A1c   COMPLETE METABOLIC PANEL WITH GFR   Lipid panel   TSH   Needs flu shot       Relevant Orders    Flu Vaccine QUAD 36+ mos IM   Need for hepatitis C screening test       Relevant Orders   Hepatitis C antibody      Updated Health Maintenance information See AVS Labs ordered for today, review on mychart Encouraged improvement to lifestyle with diet and exercise Goal of weight loss  #Allergies Asthma Trial on Singulair nightly to help allergy/asthma maintenance  #PreDM Due for A1c Will check today Counseling on lifestyle.  #Vitamin D Deficiency Mild low 16 Prior chronic issue Trial on VitD 5k daily x 3 months then reduce to 2k  #Morbid Obesity , BMI >39 with co morbid conditions (Pre-Diabetes,  Hyperlipidemia, GERD) Encourage weight loss lifestyle Failed Contrave in past Counseling in future if ready can return for visit to discuss GLP1 medications  Meds ordered this encounter  Medications  . albuterol (PROVENTIL) (2.5 MG/3ML) 0.083% nebulizer solution    Sig: Take 3 mLs (2.5 mg total) by nebulization every 6 (six) hours as needed for wheezing or shortness of breath.    Dispense:  150 mL    Refill:  1  . albuterol (VENTOLIN HFA) 108 (90 Base) MCG/ACT inhaler    Sig: Inhale 2 puffs into the lungs every 6 (six) hours as needed for wheezing or shortness of breath.    Dispense:  8 g    Refill:  3  . montelukast (SINGULAIR) 10 MG tablet    Sig: Take 1 tablet (10 mg total) by mouth at bedtime.    Dispense:  90 tablet    Refill:  3  . fluticasone (FLONASE) 50 MCG/ACT nasal spray    Sig: Place 2 sprays into both nostrils daily. Use for 4-6  weeks then stop and use seasonally or as needed.    Dispense:  16 g    Refill:  3     Follow up plan: Return in about 6 months (around 05/05/2021) for 6 month follow-up PreDM A1c, asthma, anxiety.  [name], DO [facility] 11/04/2020, 9:16 AM

## 2020-11-05 LAB — COMPLETE METABOLIC PANEL WITH GFR
AG Ratio: 1.6 (calc) (ref 1.0–2.5)
ALT: 9 U/L (ref 6–29)
AST: 3 U/L — ABNORMAL LOW (ref 10–30)
Albumin: 4.2 g/dL (ref 3.6–5.1)
Alkaline phosphatase (APISO): 42 U/L (ref 31–125)
BUN: 10 mg/dL (ref 7–25)
CO2: 26 mmol/L (ref 20–32)
Calcium: 9.2 mg/dL (ref 8.6–10.2)
Chloride: 106 mmol/L (ref 98–110)
Creat: 0.73 mg/dL (ref 0.50–1.10)
GFR, Est African American: 126 mL/min/{1.73_m2} (ref >=60)
GFR, Est Non African American: 109 mL/min/{1.73_m2} (ref >=60)
Globulin: 2.7 g/dL (calc) (ref 1.9–3.7)
Glucose, Bld: 92 mg/dL (ref 65–99)
Potassium: 4.3 mmol/L (ref 3.5–5.3)
Sodium: 139 mmol/L (ref 135–146)
Total Bilirubin: 0.4 mg/dL (ref 0.2–1.2)
Total Protein: 6.9 g/dL (ref 6.1–8.1)

## 2020-11-05 LAB — LIPID PANEL
Cholesterol: 204 mg/dL — ABNORMAL HIGH (ref <200)
HDL: 42 mg/dL — ABNORMAL LOW (ref >=50)
LDL Cholesterol (Calc): 145 mg/dL (calc) — ABNORMAL HIGH
Non-HDL Cholesterol (Calc): 162 mg/dL (calc) — ABNORMAL HIGH (ref <130)
Total CHOL/HDL Ratio: 4.9 (calc) (ref <5.0)
Triglycerides: 71 mg/dL (ref <150)

## 2020-11-05 LAB — HEMOGLOBIN A1C
Hgb A1c MFr Bld: 5.9 % of total Hgb — ABNORMAL HIGH (ref <5.7)
Mean Plasma Glucose: 123 mg/dL
eAG (mmol/L): 6.8 mmol/L

## 2020-11-05 LAB — HEPATITIS C ANTIBODY
Hepatitis C Ab: NONREACTIVE
SIGNAL TO CUT-OFF: 0.03 (ref <1.00)

## 2020-11-05 LAB — TSH: TSH: 1.78 mIU/L

## 2020-11-16 ENCOUNTER — Other Ambulatory Visit: Payer: Self-pay

## 2020-11-16 ENCOUNTER — Telehealth: Payer: Medicaid Other | Admitting: Family Medicine

## 2020-11-16 ENCOUNTER — Encounter: Payer: Self-pay | Admitting: Emergency Medicine

## 2020-11-16 ENCOUNTER — Emergency Department
Admission: EM | Admit: 2020-11-16 | Discharge: 2020-11-16 | Disposition: A | Discharge status: Home / Self Care | Payer: Medicaid Other | Attending: Emergency Medicine | Admitting: Emergency Medicine

## 2020-11-16 ENCOUNTER — Other Ambulatory Visit: Payer: BC Managed Care – PPO

## 2020-11-16 ENCOUNTER — Emergency Department: Payer: Medicaid Other

## 2020-11-16 DIAGNOSIS — R079 Chest pain, unspecified: Secondary | ICD-10-CM

## 2020-11-16 DIAGNOSIS — Z20822 Contact with and (suspected) exposure to covid-19: Secondary | ICD-10-CM | POA: Diagnosis not present

## 2020-11-16 DIAGNOSIS — R072 Precordial pain: Secondary | ICD-10-CM | POA: Diagnosis not present

## 2020-11-16 DIAGNOSIS — J4530 Mild persistent asthma, uncomplicated: Secondary | ICD-10-CM | POA: Insufficient documentation

## 2020-11-16 DIAGNOSIS — R0789 Other chest pain: Secondary | ICD-10-CM | POA: Diagnosis not present

## 2020-11-16 DIAGNOSIS — R059 Cough, unspecified: Secondary | ICD-10-CM | POA: Diagnosis not present

## 2020-11-16 DIAGNOSIS — R0981 Nasal congestion: Secondary | ICD-10-CM

## 2020-11-16 LAB — RESP PANEL BY RT-PCR (FLU A&B, COVID) ARPGX2
Influenza A by PCR: NEGATIVE
Influenza B by PCR: NEGATIVE
SARS Coronavirus 2 by RT PCR: NEGATIVE

## 2020-11-16 MED ORDER — FAMOTIDINE 20 MG PO TABS
20.0000 mg | ORAL_TABLET | Freq: Two times a day (BID) | ORAL | 0 refills | Status: DC
Start: 2020-11-16 — End: 2022-01-21

## 2020-11-16 NOTE — Discharge Instructions (Signed)
Please follow up with your primary care provider for symptoms that are not improving over the next few days.  Return to the ER for symptoms that change or worsen if unable to schedule an appointment. 

## 2020-11-16 NOTE — ED Provider Notes (Signed)
[facility] Emergency Department Provider Note ____________________________________________   Event Date/Time   First MD Initiated Contact with Patient 11/16/20 1417     (approximate)  I have reviewed the triage vital signs and the nursing notes.   HISTORY  Chief Complaint Nasal Congestion and Cough  HPI Jasmine Gordon is a 34 y.o. female with history of asthma, allergies, anemia, presents to the emergency department for treatment and evaluation of sinus congestion chest pain. .         Past Medical History:  Diagnosis Date  . Allergy   . Anemia   . Asthma   . Migraine   . Panic attack   . PTSD (post-traumatic stress disorder) 2016    Patient Active Problem List   Diagnosis Date Noted  . Environmental and seasonal allergies 11/04/2020  . Genital warts 09/11/2018  . Positive TB test 09/25/2017  . Allergic contact dermatitis due to metals 05/17/2017  . Pre-diabetes 03/06/2017  . Hyperlipidemia 03/06/2017  . Allergic reaction 02/08/2017  . Urticaria due to food allergy 02/08/2017  . Vitamin D deficiency 11/08/2016  . Iron deficiency anemia due to chronic blood loss 10/03/2016  . Menorrhagia with regular cycle 06/27/2016  . Mild persistent asthma without complication 06/17/2016  . Anxiety and depression 06/17/2016  . Morbid obesity (HCC) 06/17/2016    Past Surgical History:  Procedure Laterality Date  . APPENDECTOMY    . Plantar warts      Prior to Admission medications   Medication Sig Start Date End Date Taking? Authorizing Provider  famotidine (PEPCID) 20 MG tablet Take 1 tablet (20 mg total) by mouth 2 (two) times daily for 14 days. 11/16/20 11/30/20 Yes Lissandro Dilorenzo B, FNP  albuterol (PROVENTIL) (2.5 MG/3ML) 0.083% nebulizer solution Take 3 mLs (2.5 mg total) by nebulization every 6 (six) hours as needed for wheezing or shortness of breath. 11/04/20   Karamalegos, Netta Neat, DO  albuterol (VENTOLIN HFA) 108 (90 Base)  MCG/ACT inhaler Inhale 2 puffs into the lungs every 6 (six) hours as needed for wheezing or shortness of breath. 11/04/20   Karamalegos, Netta Neat, DO  fluticasone (FLONASE) 50 MCG/ACT nasal spray Place 2 sprays into both nostrils daily. Use for 4-6 weeks then stop and use seasonally or as needed. 11/04/20   Karamalegos, Netta Neat, DO  montelukast (SINGULAIR) 10 MG tablet Take 1 tablet (10 mg total) by mouth at bedtime. 11/04/20   Karamalegos, Netta Neat, DO  nortriptyline (PAMELOR) 10 MG capsule Take 10 mg by mouth at bedtime. 10/10/19   [provider]  ondansetron (ZOFRAN) 4 MG tablet Take 1 tablet (4 mg total) by mouth every 8 (eight) hours as needed. 09/24/19   Phineas Semen, MD    Allergies Reglan [metoclopramide]  Family History  Problem Relation Age of Onset  . Depression Mother   . Mental retardation Mother   . Bipolar disorder Mother   . Diabetes Father   . Stroke Maternal Grandfather   . Diabetes Maternal Grandfather     Social History Social History   Tobacco Use  . Smoking status: Never Smoker  . Smokeless tobacco: Never Used  Vaping Use  . Vaping Use: Never used  Substance Use Topics  . Alcohol use: No  . Drug use: No    Review of Systems  Constitutional: No fever/chills Eyes: No visual changes. ENT: No sore throat. Positive for sinus pressure. Cardiovascular: Positive for chest pain. Respiratory: Denies shortness of breath. Gastrointestinal: No abdominal pain.  No nausea,  no vomiting.  No diarrhea.  No constipation. Genitourinary: Negative for dysuria. Musculoskeletal: Negative for back pain. Skin: Negative for rash. Neurological: Negative for headaches, focal weakness or numbness. ____________________________________________   PHYSICAL EXAM:  VITAL SIGNS: ED Triage Vitals  Enc Vitals Group     BP 11/16/20 1203 124/84     Pulse Rate 11/16/20 1203 86     Resp 11/16/20 1203 20     Temp 11/16/20 1203 98.2 F (36.8 C)     Temp  Source 11/16/20 1203 Oral     SpO2 11/16/20 1203 100 %     Weight 11/16/20 1204 250 lb (113.4 kg)     Height 11/16/20 1204 5\' 7"  (1.702 m)     Head Circumference --      Peak Flow --      Pain Score 11/16/20 1203 0     Pain Loc --      Pain Edu? --      Excl. in GC? --     Constitutional: Alert and oriented. Well appearing and in no acute distress. Eyes: Conjunctivae are normal. PERRL. EOMI. Head: Atraumatic. Nose: No congestion/rhinnorhea. Mouth/Throat: Mucous membranes are moist.  Oropharynx non-erythematous. Neck: No stridor.   Hematological/Lymphatic/Immunilogical: No cervical lymphadenopathy. Cardiovascular: Normal rate, regular rhythm. Grossly normal heart sounds.  Good peripheral circulation. Respiratory: Normal respiratory effort.  No retractions. Lungs CTAB. Gastrointestinal: Soft and nontender. No distention. No abdominal bruits. No CVA tenderness. Genitourinary:  Musculoskeletal: No lower extremity tenderness nor edema.  No joint effusions. Neurologic:  Normal speech and language. No gross focal neurologic deficits are appreciated. No gait instability. Skin:  Skin is warm, dry and intact. No rash noted. Psychiatric: Mood and affect are normal. Speech and behavior are normal.  ____________________________________________   LABS (all labs ordered are listed, but only abnormal results are displayed)  Labs Reviewed  RESP PANEL BY RT-PCR (FLU A&B, COVID) [id number]  POC URINE PREG, ED   ____________________________________________  EKG  ED ECG REPORT I, Anastasia Tompson, FNP-BC personally viewed and interpreted this ECG.   Date: 11/16/2020  EKG Time: 1617  Rate: 80  Rhythm: normal sinus rhythm  Axis: normal  Intervals:none  ST&T Change: no ST elevation  ____________________________________________  RADIOLOGY  ED MD interpretation:    Chest x-ray negative for acute findings.  I, 11/18/2020, personally viewed and evaluated these images (plain radiographs)  as part of my medical decision making, as well as reviewing the written report by the radiologist.  Official radiology report(s): DG Chest 1 View  Result Date: 11/16/2020 CLINICAL DATA:  Midsternal chest pain. EXAM: CHEST  1 VIEW COMPARISON:  03/13/2020 and prior. FINDINGS: No focal consolidation. No pneumothorax or pleural effusion. Stable cardiomediastinal silhouette. No acute osseous abnormality. IMPRESSION: No focal airspace disease. Electronically Signed   By: 03/15/2020 M.D.   On: 11/16/2020 14:55    ____________________________________________   PROCEDURES  Procedure(s) performed (including Critical Care):  Procedures  ____________________________________________   INITIAL IMPRESSION / ASSESSMENT AND PLAN     34 year old female presenting to the emergency department for treatment and evaluation of nasal congestion with a dry cough, headache, and tightness in her chest.  Symptoms started yesterday.  She states that it gets worse with cough.  She also states that she has had some anxiety which tends to make it feel tight as well.  She has had some chest pain in the past but no diagnosis of CAD.  She is not currently on any antihypertensives.  Pain is intermittent and  is epigastric that rates into the mid chest.  DIFFERENTIAL DIAGNOSIS  COVID-19, influenza, GERD, CAD  ED COURSE  Test results, EKG and exam were all reassuring.  She is Covid negative.  Patient feels reassured.  This may likely be GERD as she does have some epigastric pain that radiates up into the midsternal area.  She will be placed on Pepcid.  She will be encouraged to follow-up with primary care for symptoms that do not improve over the next few days.  She was encouraged to return to the emergency department for symptoms of change or worsen if she is unable to schedule appointment.    ___________________________________________   FINAL CLINICAL IMPRESSION(S) / ED DIAGNOSES  Final diagnoses:   Chest pain in adult  Sinus congestion     ED Discharge Orders         Ordered    famotidine (PEPCID) 20 MG tablet  2 times daily        11/16/20 1626           Jasmine Gordon was evaluated in Emergency Department on 11/16/2020 for the symptoms described in the history of present illness. She was evaluated in the context of the global COVID-19 pandemic, which necessitated consideration that the patient might be at risk for infection with the SARS-CoV-2 virus that causes COVID-19. Institutional protocols and algorithms that pertain to the evaluation of patients at risk for COVID-19 are in a state of rapid change based on information released by regulatory bodies including the CDC and federal and state organizations. These policies and algorithms were followed during the patient's care in the ED.   Note:  This document was prepared using Dragon voice recognition software and may include unintentional dictation errors.   Chinita Pester, FNP 11/16/20 1847    Chesley Noon, MD 11/16/20 Barry Brunner

## 2020-11-16 NOTE — ED Triage Notes (Signed)
Pt via POV from home. Pt c/o nasal congestion, dry cough, headache, loss of taste and smell that started Wednesday. Pt has a hx asthma. Pt been using her inhaler with no relief.

## 2020-11-17 ENCOUNTER — Telehealth: Payer: Self-pay

## 2020-11-17 NOTE — Telephone Encounter (Signed)
Transition Care Management Follow-up Telephone Call  Date of discharge and from where: 11/16/2020 [facility] ED  How have you been since you were released from the hospital? Patient stated "I guess I am doing fine, they didn't do much yesterday"   Any questions or concerns? No  Items Reviewed:  Did the pt receive and understand the discharge instructions provided? Yes   Medications obtained and verified? Will pick up today.   Other? No   Any new allergies since your discharge? No   Dietary orders reviewed? Yes  Do you have support at home? Yes     Functional Questionnaire: (I = Independent and D = Dependent) ADLs: I  Bathing/Dressing- I  Meal Prep- I  Eating- I  Maintaining continence- I  Transferring/Ambulation- I  Managing Meds- I  Follow up appointments reviewed:   PCP Hospital f/u appt confirmed? No  Patient will contact [facility] if needed. Specialist Hospital f/u appt confirmed? No    Are transportation arrangements needed? No   If their condition worsens, is the pt aware to call PCP or go to the Emergency Dept.? Yes  Was the patient provided with contact information for the PCP's office or ED? Yes  Was to pt encouraged to call back with questions or concerns? Yes

## 2020-11-27 ENCOUNTER — Inpatient Hospital Stay: Payer: Medicaid Other

## 2020-11-30 ENCOUNTER — Inpatient Hospital Stay: Payer: Medicaid Other | Admitting: Oncology

## 2020-12-01 ENCOUNTER — Other Ambulatory Visit: Payer: Self-pay

## 2020-12-01 ENCOUNTER — Inpatient Hospital Stay: Payer: Medicaid Other | Attending: Oncology

## 2020-12-01 DIAGNOSIS — D649 Anemia, unspecified: Secondary | ICD-10-CM | POA: Diagnosis not present

## 2020-12-01 DIAGNOSIS — Z79899 Other long term (current) drug therapy: Secondary | ICD-10-CM | POA: Insufficient documentation

## 2020-12-01 DIAGNOSIS — D500 Iron deficiency anemia secondary to blood loss (chronic): Secondary | ICD-10-CM

## 2020-12-01 LAB — CBC WITH DIFFERENTIAL/PLATELET
Abs Immature Granulocytes: 0.01 10*3/uL (ref 0.00–0.07)
Basophils Absolute: 0 10*3/uL (ref 0.0–0.1)
Basophils Relative: 1 %
Eosinophils Absolute: 0.1 10*3/uL (ref 0.0–0.5)
Eosinophils Relative: 3 %
HCT: 35.9 % — ABNORMAL LOW (ref 36.0–46.0)
Hemoglobin: 11.6 g/dL — ABNORMAL LOW (ref 12.0–15.0)
Immature Granulocytes: 0 %
Lymphocytes Relative: 43 %
Lymphs Abs: 2 10*3/uL (ref 0.7–4.0)
MCH: 26.9 pg (ref 26.0–34.0)
MCHC: 32.3 g/dL (ref 30.0–36.0)
MCV: 83.3 fL (ref 80.0–100.0)
Monocytes Absolute: 0.3 10*3/uL (ref 0.1–1.0)
Monocytes Relative: 7 %
Neutro Abs: 2.2 10*3/uL (ref 1.7–7.7)
Neutrophils Relative %: 46 %
Platelets: 215 10*3/uL (ref 150–400)
RBC: 4.31 MIL/uL (ref 3.87–5.11)
RDW: 12.9 % (ref 11.5–15.5)
WBC: 4.7 10*3/uL (ref 4.0–10.5)
nRBC: 0 % (ref 0.0–0.2)

## 2020-12-01 LAB — FERRITIN: Ferritin: 33 ng/mL (ref 11–307)

## 2020-12-01 LAB — IRON AND TIBC
Iron: 84 ug/dL (ref 28–170)
Saturation Ratios: 26 % (ref 10.4–31.8)
TIBC: 326 ug/dL (ref 250–450)
UIBC: 242 ug/dL

## 2020-12-02 ENCOUNTER — Inpatient Hospital Stay (HOSPITAL_BASED_OUTPATIENT_CLINIC_OR_DEPARTMENT_OTHER): Payer: Medicaid Other | Admitting: Oncology

## 2020-12-02 ENCOUNTER — Encounter: Payer: Self-pay | Admitting: Oncology

## 2020-12-02 DIAGNOSIS — D649 Anemia, unspecified: Secondary | ICD-10-CM | POA: Diagnosis not present

## 2020-12-02 DIAGNOSIS — Z79899 Other long term (current) drug therapy: Secondary | ICD-10-CM | POA: Diagnosis not present

## 2020-12-02 MED ORDER — VITAMIN B-12 1000 MCG PO TABS: 1000.0000 ug | ORAL_TABLET | Freq: Every day | ORAL | 1 refills | Status: DC

## 2020-12-02 NOTE — Progress Notes (Signed)
Pt contacted for Mychart visit. No new concerns voiced.  

## 2020-12-02 NOTE — Progress Notes (Signed)
HEMATOLOGY-ONCOLOGY TeleHEALTH VISIT PROGRESS NOTE  I connected with Jasmine Gordon on 12/02/20 at  3:30 PM EST by video enabled telemedicine visit and verified that I am speaking with the correct person using two identifiers. I discussed the limitations, risks, security and privacy concerns of performing an evaluation and management service by telemedicine and the availability of in-person appointments. I also discussed with the patient that there may be a patient responsible charge related to this service. The patient expressed understanding and agreed to proceed.   Other persons participating in the visit and their role in the encounter:  None  Patient's location: Home  Provider's location: office Chief Complaint: Iron deficiency anemia   INTERVAL HISTORY Jasmine Gordon is a 35 y.o. female who has above history reviewed by me today presents for follow up visit for management of iron deficiency anemia Problems and complaints are listed below:  Patient reports feeling well today.  No new complaints.   Review of Systems  Constitutional: Positive for fatigue. Negative for appetite change, chills and fever.  HENT:   Negative for hearing loss and voice change.   Eyes: Negative for eye problems.  Respiratory: Negative for chest tightness and cough.   Cardiovascular: Negative for chest pain.  Gastrointestinal: Negative for abdominal distention, abdominal pain and blood in stool.  Endocrine: Negative for hot flashes.  Genitourinary: Negative for difficulty urinating and frequency.   Musculoskeletal: Negative for arthralgias.  Skin: Negative for itching and rash.  Neurological: Negative for extremity weakness.  Hematological: Negative for adenopathy.  Psychiatric/Behavioral: Negative for confusion.    Past Medical History:  Diagnosis Date  . Allergy   . Anemia   . Asthma   . Migraine   . Panic attack   . PTSD (post-traumatic stress disorder) 2016   Past Surgical History:   Procedure Laterality Date  . APPENDECTOMY    . Plantar warts      Family History  Problem Relation Age of Onset  . Depression Mother   . Mental retardation Mother   . Bipolar disorder Mother   . Diabetes Father   . Stroke Maternal Grandfather   . Diabetes Maternal Grandfather     Social History   Socioeconomic History  . Marital status: Married    Spouse name: Not on file  . Number of children: Not on file  . Years of education: Not on file  . Highest education level: Not on file  Occupational History  . Not on file  Tobacco Use  . Smoking status: Never Smoker  . Smokeless tobacco: Never Used  Vaping Use  . Vaping Use: Never used  Substance and Sexual Activity  . Alcohol use: No  . Drug use: No  . Sexual activity: Yes    Birth control/protection: Condom  Other Topics Concern  . Not on file  Social History Narrative  . Not on file   Social Determinants of Health   Financial Resource Strain: Not on file  Food Insecurity: Not on file  Transportation Needs: Not on file  Physical Activity: Not on file  Stress: Not on file  Social Connections: Not on file  Intimate Partner Violence: Not on file    Current Outpatient Medications on File Prior to Visit  Medication Sig Dispense Refill  . albuterol (PROVENTIL) (2.5 MG/3ML) 0.083% nebulizer solution Take 3 mLs (2.5 mg total) by nebulization every 6 (six) hours as needed for wheezing or shortness of breath. 150 mL 1  . albuterol (VENTOLIN HFA) 108 (90 Base) MCG/ACT  inhaler Inhale 2 puffs into the lungs every 6 (six) hours as needed for wheezing or shortness of breath. 8 g 3  . famotidine (PEPCID) 20 MG tablet Take 1 tablet (20 mg total) by mouth 2 (two) times daily for 14 days. 28 tablet 0  . fluticasone (FLONASE) 50 MCG/ACT nasal spray Place 2 sprays into both nostrils daily. Use for 4-6 weeks then stop and use seasonally or as needed. 16 g 3  . montelukast (SINGULAIR) 10 MG tablet Take 1 tablet (10 mg total) by mouth at  bedtime. 90 tablet 3  . nortriptyline (PAMELOR) 10 MG capsule Take 10 mg by mouth at bedtime.    . ondansetron (ZOFRAN) 4 MG tablet Take 1 tablet (4 mg total) by mouth every 8 (eight) hours as needed. 20 tablet 0   No current facility-administered medications on file prior to visit.    Allergies  Allergen Reactions  . Reglan [Metoclopramide] Hives and Shortness Of Breath       Observations/Objective: Today's Vitals   12/02/20 1458  PainSc: 0-No pain   There is no height or weight on file to calculate BMI.  Physical Exam Constitutional:      General: She is not in acute distress.    CBC    Component Value Date/Time   WBC 4.7 12/01/2020 1044   RBC 4.31 12/01/2020 1044   HGB 11.6 (L) 12/01/2020 1044   HGB 12.1 09/17/2014 1148   HCT 35.9 (L) 12/01/2020 1044   HCT 37.4 09/17/2014 1148   PLT 215 12/01/2020 1044   PLT 202 09/17/2014 1148   MCV 83.3 12/01/2020 1044   MCV 83 09/17/2014 1148   MCH 26.9 12/01/2020 1044   MCHC 32.3 12/01/2020 1044   RDW 12.9 12/01/2020 1044   RDW 14.1 09/17/2014 1148   LYMPHSABS 2.0 12/01/2020 1044   LYMPHSABS 2.1 09/17/2014 1148   MONOABS 0.3 12/01/2020 1044   MONOABS 0.3 09/17/2014 1148   EOSABS 0.1 12/01/2020 1044   EOSABS 0.1 09/17/2014 1148   BASOSABS 0.0 12/01/2020 1044   BASOSABS 0.0 09/17/2014 1148    CMP     Component Value Date/Time   NA 139 11/04/2020 0950   NA 140 09/17/2014 1148   K 4.3 11/04/2020 0950   K 3.5 09/17/2014 1148   CL 106 11/04/2020 0950   CL 107 09/17/2014 1148   CO2 26 11/04/2020 0950   CO2 24 09/17/2014 1148   GLUCOSE 92 11/04/2020 0950   GLUCOSE 101 (H) 09/17/2014 1148   BUN 10 11/04/2020 0950   BUN 11 09/17/2014 1148   CREATININE 0.73 11/04/2020 0950   CALCIUM 9.2 11/04/2020 0950   CALCIUM 8.7 09/17/2014 1148   PROT 6.9 11/04/2020 0950   PROT 7.5 09/17/2014 1148   ALBUMIN 4.2 02/21/2020 0346   ALBUMIN 3.7 09/17/2014 1148   AST 3 (L) 11/04/2020 0950   AST 13 (L) 09/17/2014 1148   ALT 9  11/04/2020 0950   ALT 16 09/17/2014 1148   ALKPHOS 43 02/21/2020 0346   ALKPHOS 57 09/17/2014 1148   BILITOT 0.4 11/04/2020 0950   BILITOT 0.4 09/17/2014 1148   GFRNONAA 109 11/04/2020 0950   GFRAA 126 11/04/2020 0950     Assessment and Plan: 1. Normocytic anemia    Lab result reviewed and discussed with patient. History of iron deficiency.  Iron panel is normal and stable. Hemoglobin is chronically slightly lower than normal lower limit.  Today at 11.6. Noticed that her vitamin B12 level in the past is borderline I  asked patient to empirically start vitamin B12 supplementation 1000 MCG daily.  Prescription sent to pharmacy. Check B12 level, hemoglobinopathy evaluation, reticulocyte panel  Follow Up Instructions: 6 months with lab and MD   I discussed the assessment and treatment plan with the patient. The patient was provided an opportunity to ask questions and all were answered. The patient agreed with the plan and demonstrated an understanding of the instructions.  The patient was advised to call back or seek an in-person evaluation if the symptoms worsen or if the condition fails to improve as anticipated.     [name], MD 12/02/2020 4:55 PM

## 2020-12-03 ENCOUNTER — Encounter: Payer: Self-pay | Admitting: Oncology

## 2020-12-03 ENCOUNTER — Inpatient Hospital Stay: Payer: Medicaid Other

## 2020-12-03 DIAGNOSIS — D649 Anemia, unspecified: Secondary | ICD-10-CM

## 2020-12-03 DIAGNOSIS — Z79899 Other long term (current) drug therapy: Secondary | ICD-10-CM | POA: Diagnosis not present

## 2020-12-03 LAB — RETIC PANEL
Immature Retic Fract: 6.6 % (ref 2.3–15.9)
RBC.: 4.43 MIL/uL (ref 3.87–5.11)
Retic Count, Absolute: 62.9 10*3/uL (ref 19.0–186.0)
Retic Ct Pct: 1.4 % (ref 0.4–3.1)
Reticulocyte Hemoglobin: 32 pg (ref >27.9)

## 2020-12-03 LAB — VITAMIN B12: Vitamin B-12: 247 pg/mL (ref 180–914)

## 2020-12-05 LAB — HGB FRACTIONATION CASCADE
Hgb A2: 2.2 % (ref 1.8–3.2)
Hgb A: 97.4 % (ref 96.4–98.8)
Hgb F: 0.4 % (ref 0.0–2.0)
Hgb S: 0 %

## 2020-12-08 ENCOUNTER — Encounter: Payer: Self-pay | Admitting: Obstetrics and Gynecology

## 2020-12-08 ENCOUNTER — Ambulatory Visit (INDEPENDENT_AMBULATORY_CARE_PROVIDER_SITE_OTHER): Payer: Medicaid Other | Admitting: Obstetrics and Gynecology

## 2020-12-08 ENCOUNTER — Other Ambulatory Visit: Payer: Self-pay

## 2020-12-08 ENCOUNTER — Other Ambulatory Visit (HOSPITAL_COMMUNITY)
Admission: RE | Admit: 2020-12-08 | Discharge: 2020-12-08 | Disposition: A | Discharge status: Home / Self Care | Payer: Medicaid Other | Source: Ambulatory Visit | Attending: Obstetrics and Gynecology | Admitting: Obstetrics and Gynecology

## 2020-12-08 VITALS — BP 126/84 | Ht 67.0 in | Wt 257.0 lb

## 2020-12-08 DIAGNOSIS — Z1339 Encounter for screening examination for other mental health and behavioral disorders: Secondary | ICD-10-CM | POA: Diagnosis not present

## 2020-12-08 DIAGNOSIS — Z01419 Encounter for gynecological examination (general) (routine) without abnormal findings: Secondary | ICD-10-CM | POA: Diagnosis not present

## 2020-12-08 DIAGNOSIS — R87618 Other abnormal cytological findings on specimens from cervix uteri: Secondary | ICD-10-CM

## 2020-12-08 DIAGNOSIS — Z1331 Encounter for screening for depression: Secondary | ICD-10-CM | POA: Diagnosis not present

## 2020-12-08 NOTE — Progress Notes (Signed)
Gynecology Annual Exam  PCP: [name], DO  Chief Complaint  Patient presents with  . Annual Exam    History of Present Illness:  [name] is a 35 y.o. [phone] who LMP was Patient's last menstrual period was 12/01/2020., presents today for her annual examination.  Her menses are regular every 28-30 days, lasting 5 day(s).  Dysmenorrhea none. She does not have intermenstrual bleeding. Her periods are not heavy.   She has some perimenstrual symptoms. She has some trouble breathing and weird chest symptoms. She also has some headaches. She is monitoring these symptoms.    She is sexually active. No problems with intercourse.  She uses condoms. She has had an IUD and did not like it.    Last Pap: 1 year  Results were: no abnormalities /neg HPV DNA positive 2019 - NILM, HPV+. States she has had HPV since 2018.    Hx of STDs: HPV  There is no FH of breast cancer. There is no FH of ovarian cancer. The patient does do self-breast exams.  Tobacco use: The patient denies current or previous tobacco use. Alcohol use: none Exercise: "trying to"  The patient wears seatbelts: yes.   The patient reports that domestic violence in her life is absent.   Past Medical History:  Diagnosis Date  . Allergy   . Anemia   . Asthma   . Migraine   . Panic attack   . PTSD (post-traumatic stress disorder) 2016    Past Surgical History:  Procedure Laterality Date  . APPENDECTOMY    . Plantar warts      Prior to Admission medications   Medication Sig Start Date End Date Taking? Authorizing Provider  albuterol (PROVENTIL) (2.5 MG/3ML) 0.083% nebulizer solution Take 3 mLs (2.5 mg total) by nebulization every 6 (six) hours as needed for wheezing or shortness of breath. 11/04/20  Yes Karamalegos, Netta Neat, DO  albuterol (VENTOLIN HFA) 108 (90 Base) MCG/ACT inhaler Inhale 2 puffs into the lungs every 6 (six) hours as needed for wheezing or shortness of breath. 11/04/20  Yes  Karamalegos, Netta Neat, DO  fluticasone (FLONASE) 50 MCG/ACT nasal spray Place 2 sprays into both nostrils daily. Use for 4-6 weeks then stop and use seasonally or as needed. 11/04/20  Yes Karamalegos, Alexander J, DO  montelukast (SINGULAIR) 10 MG tablet Take 1 tablet (10 mg total) by mouth at bedtime. 11/04/20  Yes Karamalegos, Netta Neat, DO  nortriptyline (PAMELOR) 10 MG capsule Take 10 mg by mouth at bedtime. 10/10/19  Yes [provider]  vitamin B-12 (CYANOCOBALAMIN) 1000 MCG tablet Take 1 tablet (1,000 mcg total) by mouth daily. 12/02/20  Yes [name], MD  famotidine (PEPCID) 20 MG tablet Take 1 tablet (20 mg total) by mouth 2 (two) times daily for 14 days. 11/16/20 11/30/20  Chinita Pester, FNP    Allergies  Allergen Reactions  . Reglan [Metoclopramide] Hives and Shortness Of Breath   Obstetric History: [id number]  Social History   Socioeconomic History  . Marital status: Married    Spouse name: Not on file  . Number of children: Not on file  . Years of education: Not on file  . Highest education level: Not on file  Occupational History  . Not on file  Tobacco Use  . Smoking status: Never Smoker  . Smokeless tobacco: Never Used  Vaping Use  . Vaping Use: Never used  Substance and Sexual Activity  . Alcohol use: No  . Drug use:  No  . Sexual activity: Yes    Birth control/protection: Condom  Other Topics Concern  . Not on file  Social History Narrative  . Not on file   Social Determinants of Health   Financial Resource Strain: Not on file  Food Insecurity: Not on file  Transportation Needs: Not on file  Physical Activity: Not on file  Stress: Not on file  Social Connections: Not on file  Intimate Partner Violence: Not on file    Family History  Problem Relation Age of Onset  . Depression Mother   . Mental retardation Mother   . Bipolar disorder Mother   . Diabetes Father   . Stroke Maternal Grandfather   . Diabetes Maternal Grandfather      Review of Systems  Constitutional: Negative.   HENT: Negative.   Eyes: Negative.   Respiratory: Negative.   Cardiovascular: Negative.   Gastrointestinal: Negative.   Genitourinary: Negative.   Musculoskeletal: Negative.   Skin: Negative.   Neurological: Negative.   Psychiatric/Behavioral: Negative.      Physical Exam BP 126/84   Ht 5\' 7"  (1.702 m)   Wt 257 lb (116.6 kg)   LMP 12/01/2020   BMI 40.25 kg/m    Physical Exam Constitutional:      General: She is not in acute distress.    Appearance: Normal appearance. She is well-developed.  Genitourinary:     Vulva and bladder normal.     Right Labia: No rash, tenderness, lesions, skin changes or Bartholin's cyst.    Left Labia: No tenderness, lesions, skin changes, Bartholin's cyst or rash.    No inguinal adenopathy present in the right or left side.    Pelvic Tanner Score: 5/5.    No vaginal discharge, erythema, tenderness or bleeding.      Right Adnexa: not tender, not full and no mass present.    Left Adnexa: not tender, not full and no mass present.    No cervical motion tenderness, discharge, lesion or polyp.     Uterus is not enlarged or tender.     No uterine mass detected.    Pelvic exam was performed with patient in the lithotomy position.  Breasts:     Right: No inverted nipple, mass, nipple discharge, skin change or tenderness.     Left: No inverted nipple, mass, nipple discharge, skin change or tenderness.    HENT:     Head: Normocephalic and atraumatic.  Eyes:     General: No scleral icterus.    Conjunctiva/sclera: Conjunctivae normal.  Neck:     Thyroid: No thyromegaly.  Cardiovascular:     Rate and Rhythm: Normal rate and regular rhythm.     Heart sounds: No murmur heard. No friction rub. No gallop.   Pulmonary:     Effort: Pulmonary effort is normal. No respiratory distress.     Breath sounds: Normal breath sounds. No wheezing or rales.  Abdominal:     General: Bowel sounds are normal.  There is no distension.     Palpations: Abdomen is soft. There is no mass.     Tenderness: There is no abdominal tenderness. There is no guarding or rebound.     Hernia: There is no hernia in the left inguinal area or right inguinal area.  Musculoskeletal:        General: No swelling or tenderness. Normal range of motion.     Cervical back: Normal range of motion and neck supple.  Lymphadenopathy:     Cervical: No cervical  adenopathy.     Lower Body: No right inguinal adenopathy. No left inguinal adenopathy.  Neurological:     General: No focal deficit present.     Mental Status: She is alert and oriented to person, place, and time.     Cranial Nerves: No cranial nerve deficit.  Skin:    General: Skin is warm and dry.     Findings: No erythema or rash.  Psychiatric:        Mood and Affect: Mood normal.        Behavior: Behavior normal.        Judgment: Judgment normal.     Female chaperone present for pelvic and breast  portions of the physical exam  Results: AUDIT Questionnaire (screen for alcoholism): 0 PHQ-9: 7   Assessment: 35 y.o. G21P3003 female here for routine annual gynecologic examination  Plan: Problem List Items Addressed This Visit   None   Visit Diagnoses    Women's annual routine gynecological examination    -  Primary   Relevant Orders   Cytology - PAP   Screening for depression       Screening for alcoholism       Pap smear abnormality of cervix/human papillomavirus (HPV) positive       Relevant Orders   Cytology - PAP      Screening: -- Blood pressure screen normal -- Weight screening: obese: discussed management options, including lifestyle, dietary, and exercise. -- Depression screening negative (PHQ-9) -- Nutrition: normal -- cholesterol screening: not due for screening -- osteoporosis screening: not due -- tobacco screening: not using -- alcohol screening: AUDIT questionnaire indicates low-risk usage. -- family history of breast cancer  screening: done. not at high risk. -- no evidence of domestic violence or intimate partner violence. -- STD screening: gonorrhea/chlamydia NAAT not collected per patient request. -- pap smear collected per ASCCP guidelines  Thomasene Mohair, MD 12/08/2020 4:55 PM

## 2020-12-15 LAB — CYTOLOGY - PAP
Comment: NEGATIVE
Comment: NEGATIVE
HPV 16: NEGATIVE
HPV 18 / 45: NEGATIVE
High risk HPV: POSITIVE — AB

## 2020-12-22 ENCOUNTER — Other Ambulatory Visit (HOSPITAL_COMMUNITY)
Admission: RE | Admit: 2020-12-22 | Discharge: 2020-12-22 | Disposition: A | Discharge status: Home / Self Care | Payer: Medicaid Other | Source: Ambulatory Visit | Attending: Obstetrics and Gynecology | Admitting: Obstetrics and Gynecology

## 2020-12-22 ENCOUNTER — Encounter: Payer: Self-pay | Admitting: Obstetrics and Gynecology

## 2020-12-22 ENCOUNTER — Other Ambulatory Visit: Payer: Self-pay

## 2020-12-22 ENCOUNTER — Ambulatory Visit (INDEPENDENT_AMBULATORY_CARE_PROVIDER_SITE_OTHER): Payer: Medicaid Other | Admitting: Obstetrics and Gynecology

## 2020-12-22 VITALS — BP 126/78 | Ht 67.0 in | Wt 256.0 lb

## 2020-12-22 DIAGNOSIS — R87612 Low grade squamous intraepithelial lesion on cytologic smear of cervix (LGSIL): Secondary | ICD-10-CM | POA: Diagnosis not present

## 2020-12-22 HISTORY — PX: LEEP: SHX91

## 2020-12-22 NOTE — Progress Notes (Signed)
HPI:  Jasmine Gordon is a 35 y.o.  [phone]  who presents today for evaluation and management of abnormal cervical cytology.    Dysplasia History:   2021:  Results were: NILM, HPV positive 2019 - NILM, HPV+. States she has had HPV since 2018.     OB History  Gravida Para Term Preterm AB Living  3 3 3     3   SAB IAB Ectopic Multiple Live Births          3    # Outcome Date GA Lbr Len/2nd Weight Sex Delivery Anes PTL Lv  3 Term           2 Term           1 Term             Past Medical History:  Diagnosis Date  . Allergy   . Anemia   . Asthma   . Migraine   . Panic attack   . PTSD (post-traumatic stress disorder) 2016    Past Surgical History:  Procedure Laterality Date  . APPENDECTOMY    . Plantar warts      SOCIAL HISTORY:  Social History   Substance and Sexual Activity  Alcohol Use No    Social History   Substance and Sexual Activity  Drug Use No     Family History  Problem Relation Age of Onset  . Depression Mother   . Mental retardation Mother   . Bipolar disorder Mother   . Diabetes Father   . Stroke Maternal Grandfather   . Diabetes Maternal Grandfather     ALLERGIES:  Reglan [metoclopramide]  Current Outpatient Medications on File Prior to Visit  Medication Sig Dispense Refill  . albuterol (PROVENTIL) (2.5 MG/3ML) 0.083% nebulizer solution Take 3 mLs (2.5 mg total) by nebulization every 6 (six) hours as needed for wheezing or shortness of breath. 150 mL 1  . albuterol (VENTOLIN HFA) 108 (90 Base) MCG/ACT inhaler Inhale 2 puffs into the lungs every 6 (six) hours as needed for wheezing or shortness of breath. 8 g 3  . famotidine (PEPCID) 20 MG tablet Take 1 tablet (20 mg total) by mouth 2 (two) times daily for 14 days. 28 tablet 0  . fluticasone (FLONASE) 50 MCG/ACT nasal spray Place 2 sprays into both nostrils daily. Use for 4-6 weeks then stop and use seasonally or as needed. 16 g 3  . montelukast (SINGULAIR) 10 MG tablet Take 1  tablet (10 mg total) by mouth at bedtime. 90 tablet 3  . nortriptyline (PAMELOR) 10 MG capsule Take 10 mg by mouth at bedtime.    . ondansetron (ZOFRAN) 4 MG tablet Take 1 tablet (4 mg total) by mouth every 8 (eight) hours as needed. 20 tablet 0  . vitamin B-12 (CYANOCOBALAMIN) 1000 MCG tablet Take 1 tablet (1,000 mcg total) by mouth daily. 90 tablet 1   No current facility-administered medications on file prior to visit.    Physical Exam: -Vitals:  BP 126/78   Ht 5\' 7"  (1.702 m)   Wt 256 lb (116.1 kg)   LMP 12/01/2020   BMI 40.10 kg/m  GEN: WD, WN, NAD.  A+ O x 3, good mood and affect. ABD:  NT, ND.  Soft, no masses.  No hernias noted.   Pelvic:   Vulva: Normal appearance.  No lesions.  Vagina: No lesions or abnormalities noted.  Support: Normal pelvic support.  Urethra No masses tenderness or scarring.  Meatus Normal size without  lesions or prolapse.  Cervix: See below.  Anus: Normal exam.  No lesions.  Perineum: Normal exam.  No lesions.        Bimanual   Uterus: Normal size.  Non-tender.  Mobile.  AV.  Adnexae: No masses.  Non-tender to palpation.  Cul-de-sac: Negative for abnormality.   PROCEDURE: 1.  Urine Pregnancy Test:  negative 2.  Colposcopy performed with 4% acetic acid after verbal consent obtained                                         -Aceto-white Lesions Location(s): diffusely, but most pronounced at biopsy sites.              -Biopsy performed at 5, 8, 2, and 12 o'clock               -ECC indicated and performed: Yes.       -Biopsy sites made hemostatic with pressure, AgNO3, and/or Monsel's solution   -Satisfactory colposcopy: No.    -Evidence of Invasive cervical CA :  NO  ASSESSMENT:  Jasmine Gordon is a 35 y.o. G3P3003 here for  1. LGSIL on Pap smear of cervix   .  PLAN:  I discussed the grading system of pap smears and HPV high risk viral types.  We will discuss and base management after colpo results return.       Thomasene Mohair, MD   [facility], [facility] 12/22/2020  8:50 AM

## 2020-12-24 LAB — SURGICAL PATHOLOGY

## 2020-12-25 ENCOUNTER — Telehealth: Payer: Self-pay | Admitting: Obstetrics and Gynecology

## 2020-12-25 DIAGNOSIS — N871 Moderate cervical dysplasia: Secondary | ICD-10-CM

## 2020-12-25 NOTE — Telephone Encounter (Signed)
Spoke to patient as a follow up to her message. Recommend LEEP for CIN II.  She agrees and we'll schedule. All questions answered.

## 2020-12-29 ENCOUNTER — Telehealth: Payer: Self-pay

## 2020-12-29 NOTE — Telephone Encounter (Signed)
-----   Message from Conard Novak, MD sent at 12/25/2020  8:17 AM EST ----- Regarding: Schedule Surgery Surgery Booking Request Patient Full Name:  Jasmine Gordon  MRN: [id number]  DOB: 1986/08/03  Surgeon: Thomasene Mohair, MD  Requested Surgery Date and Time: TBD Primary Diagnosis AND Code: CIN II [N87.1] Secondary Diagnosis and Code:  Surgical Procedure: LEEP RNFA Requested?: No L&D Notification: No Admission Status: In-OFFICE Length of Surgery: 50 min Special Case Needs: No H&P: No Phone Interview???:  No Interpreter: No Medical Clearance:  No Special Scheduling Instructions: IN OFFICE LEEP Any known health/anesthesia issues, diabetes, sleep apnea, latex allergy, defibrillator/pacemaker?: No Acuity: n/a   (P1 highest, P2 delay may cause harm, P3 low, elective gyn, P4 lowest)

## 2020-12-29 NOTE — Telephone Encounter (Signed)
Noted. CMA aware. 

## 2020-12-29 NOTE — Telephone Encounter (Signed)
Called pt to schedule in office LEEP w Britt Boozer 2/25 @ 8:30

## 2021-01-06 ENCOUNTER — Telehealth: Payer: Self-pay

## 2021-01-06 NOTE — Telephone Encounter (Signed)
Pt calling; has question about something that is going on.  [phone]  Pt had colpo on the 1st; had IC a couple days ago; today has been cramping with a little something showing up; wondering if she messed something up.  Adv it's okay.  Pt reassured.

## 2021-01-08 ENCOUNTER — Ambulatory Visit: Payer: Medicaid Other | Admitting: Obstetrics and Gynecology

## 2021-01-11 ENCOUNTER — Emergency Department: Payer: Medicaid Other

## 2021-01-11 ENCOUNTER — Other Ambulatory Visit: Payer: Self-pay

## 2021-01-11 ENCOUNTER — Emergency Department
Admission: EM | Admit: 2021-01-11 | Discharge: 2021-01-11 | Disposition: A | Discharge status: Home / Self Care | Payer: Medicaid Other | Attending: Student in an Organized Health Care Education/Training Program | Admitting: Student in an Organized Health Care Education/Training Program

## 2021-01-11 DIAGNOSIS — M79605 Pain in left leg: Secondary | ICD-10-CM | POA: Insufficient documentation

## 2021-01-11 DIAGNOSIS — R0789 Other chest pain: Secondary | ICD-10-CM | POA: Diagnosis not present

## 2021-01-11 DIAGNOSIS — J4520 Mild intermittent asthma, uncomplicated: Secondary | ICD-10-CM | POA: Diagnosis not present

## 2021-01-11 DIAGNOSIS — R079 Chest pain, unspecified: Secondary | ICD-10-CM | POA: Diagnosis not present

## 2021-01-11 DIAGNOSIS — M79604 Pain in right leg: Secondary | ICD-10-CM | POA: Insufficient documentation

## 2021-01-11 DIAGNOSIS — R002 Palpitations: Secondary | ICD-10-CM | POA: Diagnosis not present

## 2021-01-11 LAB — CBC
HCT: 36.6 % (ref 36.0–46.0)
Hemoglobin: 11.6 g/dL — ABNORMAL LOW (ref 12.0–15.0)
MCH: 26.4 pg (ref 26.0–34.0)
MCHC: 31.7 g/dL (ref 30.0–36.0)
MCV: 83.4 fL (ref 80.0–100.0)
Platelets: 228 10*3/uL (ref 150–400)
RBC: 4.39 MIL/uL (ref 3.87–5.11)
RDW: 13 % (ref 11.5–15.5)
WBC: 6.5 10*3/uL (ref 4.0–10.5)
nRBC: 0 % (ref 0.0–0.2)

## 2021-01-11 LAB — BASIC METABOLIC PANEL
Anion gap: 9 (ref 5–15)
BUN: 17 mg/dL (ref 6–20)
CO2: 24 mmol/L (ref 22–32)
Calcium: 9.4 mg/dL (ref 8.9–10.3)
Chloride: 104 mmol/L (ref 98–111)
Creatinine, Ser: 0.82 mg/dL (ref 0.44–1.00)
GFR, Estimated: >60 mL/min (ref >60)
Glucose, Bld: 115 mg/dL — ABNORMAL HIGH (ref 70–99)
Potassium: 3.6 mmol/L (ref 3.5–5.1)
Sodium: 137 mmol/L (ref 135–145)

## 2021-01-11 LAB — D-DIMER, QUANTITATIVE: D-Dimer, Quant: 0.41 ug/mL-FEU (ref 0.00–0.50)

## 2021-01-11 LAB — TROPONIN I (HIGH SENSITIVITY)
Troponin I (High Sensitivity): <2 ng/L (ref <18)
Troponin I (High Sensitivity): 2 ng/L (ref <18)

## 2021-01-11 NOTE — ED Notes (Signed)
Patient is resting comfortably. 

## 2021-01-11 NOTE — ED Notes (Signed)
ED Provider at bedside. 

## 2021-01-11 NOTE — ED Triage Notes (Signed)
Pt comes via POV from home with c/o CP and palpitations. Pt states she was at church yesterday and started to have the palpitations. Pt states she then started hurting in her chest.  Pt states today she was having SOB and pain running down her legs.

## 2021-01-11 NOTE — ED Notes (Signed)
Warm blanket provided.

## 2021-01-11 NOTE — ED Provider Notes (Signed)
[facility] Emergency Department Provider Note    Event Date/Time   First MD Initiated Contact with Patient 01/11/21 1829     (approximate)  I have reviewed the triage vital signs and the nursing notes.   HISTORY  Chief Complaint Chest Pain    HPI Jasmine Gordon is a 35 y.o. female the below listed past medical history presents to the ER for evaluation of palpitations chest discomfort some pain in her legs.  Did recently have covid 19.  She is feeling better right now.  Denies any chest discomfort.  No nausea or vomiting.  No fevers.  States that she noted the symptoms while at church.  She does not feel short of breath.    Past Medical History:  Diagnosis Date  . Allergy   . Anemia   . Asthma   . Migraine   . Panic attack   . PTSD (post-traumatic stress disorder) 2016   Family History  Problem Relation Age of Onset  . Depression Mother   . Mental retardation Mother   . Bipolar disorder Mother   . Diabetes Father   . Stroke Maternal Grandfather   . Diabetes Maternal Grandfather    Past Surgical History:  Procedure Laterality Date  . APPENDECTOMY    . Plantar warts     Patient Active Problem List   Diagnosis Date Noted  . Environmental and seasonal allergies 11/04/2020  . Genital warts 09/11/2018  . Positive TB test 09/25/2017  . Allergic contact dermatitis due to metals 05/17/2017  . Pre-diabetes 03/06/2017  . Hyperlipidemia 03/06/2017  . Allergic reaction 02/08/2017  . Urticaria due to food allergy 02/08/2017  . Vitamin D deficiency 11/08/2016  . Iron deficiency anemia due to chronic blood loss 10/03/2016  . Menorrhagia with regular cycle 06/27/2016  . Mild persistent asthma without complication 06/17/2016  . Anxiety and depression 06/17/2016  . Morbid obesity (HCC) 06/17/2016      Prior to Admission medications   Medication Sig Start Date End Date Taking? Authorizing Provider  albuterol (PROVENTIL) (2.5 MG/3ML) 0.083%  nebulizer solution Take 3 mLs (2.5 mg total) by nebulization every 6 (six) hours as needed for wheezing or shortness of breath. 11/04/20   Karamalegos, Netta Neat, DO  albuterol (VENTOLIN HFA) 108 (90 Base) MCG/ACT inhaler Inhale 2 puffs into the lungs every 6 (six) hours as needed for wheezing or shortness of breath. 11/04/20   Karamalegos, Netta Neat, DO  famotidine (PEPCID) 20 MG tablet Take 1 tablet (20 mg total) by mouth 2 (two) times daily for 14 days. 11/16/20 11/30/20  Triplett, Kasandra Knudsen, FNP  fluticasone (FLONASE) 50 MCG/ACT nasal spray Place 2 sprays into both nostrils daily. Use for 4-6 weeks then stop and use seasonally or as needed. 11/04/20   Karamalegos, Netta Neat, DO  montelukast (SINGULAIR) 10 MG tablet Take 1 tablet (10 mg total) by mouth at bedtime. 11/04/20   Karamalegos, Netta Neat, DO  nortriptyline (PAMELOR) 10 MG capsule Take 10 mg by mouth at bedtime. 10/10/19   [provider]  ondansetron (ZOFRAN) 4 MG tablet Take 1 tablet (4 mg total) by mouth every 8 (eight) hours as needed. 09/24/19   Phineas Semen, MD  vitamin B-12 (CYANOCOBALAMIN) 1000 MCG tablet Take 1 tablet (1,000 mcg total) by mouth daily. 12/02/20   [name], MD    Allergies Reglan [metoclopramide]    Social History Social History   Tobacco Use  . Smoking status: Never Smoker  . Smokeless tobacco: Never Used  Vaping Use  . Vaping Use: Never used  Substance Use Topics  . Alcohol use: No  . Drug use: No    Review of Systems Patient denies headaches, rhinorrhea, blurry vision, numbness, shortness of breath, chest pain, edema, cough, abdominal pain, nausea, vomiting, diarrhea, dysuria, fevers, rashes or hallucinations unless otherwise stated above in HPI. ____________________________________________   PHYSICAL EXAM:  VITAL SIGNS: Vitals:   01/11/21 1707 01/11/21 1831  BP: (!) 119/97 113/83  Pulse: (!) 111 90  Resp: 17 16  Temp: 98.4 F (36.9 C)   SpO2: 100% 99%     Constitutional: Alert and oriented.  Eyes: Conjunctivae are normal.  Head: Atraumatic. Nose: No congestion/rhinnorhea. Mouth/Throat: Mucous membranes are moist.   Neck: No stridor. Painless ROM.  Cardiovascular: Normal rate, regular rhythm. Grossly normal heart sounds.  Good peripheral circulation. Respiratory: Normal respiratory effort.  No retractions. Lungs CTAB. Gastrointestinal: Soft and nontender. No distention. No abdominal bruits. No CVA tenderness. Genitourinary:  Musculoskeletal: No lower extremity tenderness nor edema.  No joint effusions. Neurologic:  Normal speech and language. No gross focal neurologic deficits are appreciated. No facial droop Skin:  Skin is warm, dry and intact. No rash noted. Psychiatric: Mood and affect are normal. Speech and behavior are normal.  ____________________________________________   LABS (all labs ordered are listed, but only abnormal results are displayed)  Results for orders placed or performed during the hospital encounter of 01/11/21 (from the past 24 hour(s))  Basic metabolic panel     Status: Abnormal   Collection Time: 01/11/21  5:20 PM  Result Value Ref Range   Sodium 137 135 - 145 mmol/L   Potassium 3.6 3.5 - 5.1 mmol/L   Chloride 104 98 - 111 mmol/L   CO2 24 22 - 32 mmol/L   Glucose, Bld 115 (H) 70 - 99 mg/dL   BUN 17 6 - 20 mg/dL   Creatinine, Ser 5.02 0.44 - 1.00 mg/dL   Calcium 9.4 8.9 - 77.4 mg/dL   GFR, Estimated >12 >87 mL/min   Anion gap 9 5 - 15  CBC     Status: Abnormal   Collection Time: 01/11/21  5:20 PM  Result Value Ref Range   WBC 6.5 4.0 - 10.5 K/uL   RBC 4.39 3.87 - 5.11 MIL/uL   Hemoglobin 11.6 (L) 12.0 - 15.0 g/dL   HCT 86.7 67.2 - 09.4 %   MCV 83.4 80.0 - 100.0 fL   MCH 26.4 26.0 - 34.0 pg   MCHC 31.7 30.0 - 36.0 g/dL   RDW 70.9 62.8 - 36.6 %   Platelets 228 150 - 400 K/uL   nRBC 0.0 0.0 - 0.2 %  Troponin I (High Sensitivity)     Status: None   Collection Time: 01/11/21  5:20 PM  Result  Value Ref Range   Troponin I (High Sensitivity) <2 <18 ng/L  D-dimer, quantitative     Status: None   Collection Time: 01/11/21  6:43 PM  Result Value Ref Range   D-Dimer, Quant 0.41 0.00 - 0.50 ug/mL-FEU   ____________________________________________  EKG My review and personal interpretation at Time: 17:16   Indication: palpitations  Rate: 105  Rhythm: sinus Axis: normal Other: normal intervals, no stemi ____________________________________________  RADIOLOGY  I personally reviewed all radiographic images ordered to evaluate for the above acute complaints and reviewed radiology reports and findings.  These findings were personally discussed with the patient.  Please see medical record for radiology report.  ____________________________________________   PROCEDURES  Procedure(s) performed:  Procedures    Critical Care performed: no ____________________________________________   INITIAL IMPRESSION / ASSESSMENT AND PLAN / ED COURSE  Pertinent labs & imaging results that were available during my care of the patient were reviewed by me and considered in my medical decision making (see chart for details).   DDX: Dysrhythmia, ACS, PE, pneumonia, musculoskeletal pain, overexertion  Jasmine Gordon is a 35 y.o. who presents to the ED with presentation as described above.  She is clinically very well-appearing afebrile hemodynamically stable.  EKG is nonischemic.  Troponin negative.  Does not seem consistent with ACS.  D-dimer negative.  She is otherwise low risk by [place].  Extremities well perfused.  No report of trauma.  Given the palpitations I do feel that referral for long-term cardiac monitoring would be appropriate but she is not showing any signs of preexcitation syndrome her EKG does appear stable for this to be done as an outpatient.     The patient was evaluated in Emergency Department today for the symptoms described in the history of present illness. He/she was  evaluated in the context of the global COVID-19 pandemic, which necessitated consideration that the patient might be at risk for infection with the SARS-CoV-2 virus that causes COVID-19. Institutional protocols and algorithms that pertain to the evaluation of patients at risk for COVID-19 are in a state of rapid change based on information released by regulatory bodies including the CDC and federal and state organizations. These policies and algorithms were followed during the patient's care in the ED.  As part of my medical decision making, I reviewed the following data within the electronic MEDICAL RECORD NUMBER Nursing notes reviewed and incorporated, Labs reviewed, notes from prior ED visits and [place] Controlled Substance Database   ____________________________________________   FINAL CLINICAL IMPRESSION(S) / ED DIAGNOSES  Final diagnoses:  Palpitations      NEW MEDICATIONS STARTED DURING THIS VISIT:  New Prescriptions   No medications on file     Note:  This document was prepared using Dragon voice recognition software and may include unintentional dictation errors.    Willy Eddy, MD 01/11/21 [phone]

## 2021-01-12 ENCOUNTER — Telehealth: Payer: Self-pay

## 2021-01-12 NOTE — Telephone Encounter (Signed)
Transition Care Management Unsuccessful Follow-up Telephone Call  Date of discharge and from where:  01/11/2021 from [facility]  Attempts:  1st Attempt  Reason for unsuccessful TCM follow-up call:  Left voice message

## 2021-01-13 NOTE — Telephone Encounter (Signed)
Transition Care Management Unsuccessful Follow-up Telephone Call  Date of discharge and from where:  01/11/2021 from [facility]  Attempts:  2nd Attempt  Reason for unsuccessful TCM follow-up call:  Unable to leave message   Pt answered and stated that she was at work and hung up the phone.

## 2021-01-14 NOTE — Telephone Encounter (Signed)
Transition Care Management Unsuccessful Follow-up Telephone Call  Date of discharge and from where:  01/11/2021 from [facility].   Attempts:  3rd Attempt  Reason for unsuccessful TCM follow-up call:  Unable to reach patient

## 2021-01-15 ENCOUNTER — Other Ambulatory Visit (HOSPITAL_COMMUNITY)
Admission: RE | Admit: 2021-01-15 | Discharge: 2021-01-15 | Disposition: A | Discharge status: Home / Self Care | Payer: Medicaid Other | Source: Ambulatory Visit | Attending: Obstetrics and Gynecology | Admitting: Obstetrics and Gynecology

## 2021-01-15 ENCOUNTER — Encounter: Payer: Self-pay | Admitting: Obstetrics and Gynecology

## 2021-01-15 ENCOUNTER — Ambulatory Visit (INDEPENDENT_AMBULATORY_CARE_PROVIDER_SITE_OTHER): Payer: Medicaid Other | Admitting: Obstetrics and Gynecology

## 2021-01-15 ENCOUNTER — Other Ambulatory Visit: Payer: Self-pay

## 2021-01-15 VITALS — BP 122/74 | Ht 67.0 in | Wt 253.0 lb

## 2021-01-15 DIAGNOSIS — N871 Moderate cervical dysplasia: Secondary | ICD-10-CM

## 2021-01-15 NOTE — Progress Notes (Signed)
  LEEP PROCEDURE NOTE  The LEEP has been explained to the patient in detail; risks/benefits reviewed.  The risks include, but are not limited to, bleeding, infection, and the possibility of cervical stenosis or cervical incompetence.  The patient had previously been given information regarding abnormal PAP smears and their relationship to HPV.  We have discussed the natural course and history of HPV, the possibility of incomplete treatment by the LEEP, as well as the possibility of recurrence.  I have reviewed the consent form for LEEP with her, and she fully understands its contents.  We have discussed the procedure itself. I have informed her that following the LEEP she should refrain from intercourse and the use of tampons for three weeks, and that she should also expect some spotting and brown/black discharge over the next several days.  We have discussed the fact that vaginal bleeding, differentiated from spotting, is not normal and that if she should have this complication, she should contact me immediately.  The follow-up after LEEP will be PAP smears or viral typing performed at regular intervals for up to 3-5 years.  Should these all prove to be normal, she will then be back on typical cervical screening.  I have answered all of her questions, and I believe she has an adequate understanding of the LEEP, its implications, and the necessity of follow-up care.  I discussed her colpo results and explained the procedure of LEEP.  All questions were answered and she signed the consent form.    LEEP performed in the usual manner after reviewing the previous colpo findings and results.  The colposcope was used to gain better visualization of the cervix throughout the procedure and was necessary to gain access to margins.   Lugol's solution was usesd to identify any abnormal areas of the cervix.  The cervix was cleansed with betadine solution. Local injection of Lidocaine was performed for  anesthesia.  Ectocervical and then endocervical specimens obtained using the loop electrodes without difficulty.  It was labeled accordingly. An ECC was obtained.   The base and edges of the defect was then cauterized using coagulation current. Monsel's solution was applied to ensure ongoing hemostasis.   The patient tolerated the procedure well.   Thomasene Mohair, MD 01/15/2021 2:40 PM

## 2021-01-15 NOTE — Patient Instructions (Signed)
LEEP POST-PROCEDURE INSTRUCTIONS  1. You may take Ibuprofen, Aleve or Tylenol for pain if needed.  Cramping is normal.  2. You will have black and/or bloody discharge at first.  This will lighten and then turn clear before completely resolving.  This will take 2 to 3 weeks.  3. Put nothing in your vagina until the bleeding or discharge stops (usually 2 or3 days).  4. You need to call if you have redness around the biopsy site, if there is any unusual draining, if the bleeding is heavy, or if you are concerned.  5. Shower or bathe as normal  6. We will call you within one week with results or we will discuss the results at your follow-up appointment if needed.  7. You will need to return for a follow-up Pap smear as directed by your physician. 

## 2021-01-20 LAB — SURGICAL PATHOLOGY

## 2021-01-26 DIAGNOSIS — J3089 Other allergic rhinitis: Secondary | ICD-10-CM

## 2021-01-26 DIAGNOSIS — J4530 Mild persistent asthma, uncomplicated: Secondary | ICD-10-CM

## 2021-01-26 MED ORDER — FLUTICASONE PROPIONATE 50 MCG/ACT NA SUSP: 2.0000 | Freq: Every day | NASAL | 3 refills | Status: DC

## 2021-01-26 MED ORDER — ALBUTEROL SULFATE HFA 108 (90 BASE) MCG/ACT IN AERS: 2.0000 | INHALATION_SPRAY | Freq: Four times a day (QID) | RESPIRATORY_TRACT | 3 refills | Status: DC | PRN

## 2021-02-15 ENCOUNTER — Ambulatory Visit (INDEPENDENT_AMBULATORY_CARE_PROVIDER_SITE_OTHER): Payer: Medicaid Other | Admitting: Obstetrics and Gynecology

## 2021-02-15 ENCOUNTER — Other Ambulatory Visit: Payer: Self-pay

## 2021-02-15 ENCOUNTER — Encounter: Payer: Self-pay | Admitting: Obstetrics and Gynecology

## 2021-02-15 VITALS — BP 130/84 | Ht 67.0 in | Wt 252.0 lb

## 2021-02-15 DIAGNOSIS — Z09 Encounter for follow-up examination after completed treatment for conditions other than malignant neoplasm: Secondary | ICD-10-CM

## 2021-02-15 DIAGNOSIS — N871 Moderate cervical dysplasia: Secondary | ICD-10-CM

## 2021-02-15 NOTE — Progress Notes (Signed)
   Postoperative Follow-up Patient presents post op from LEEP  1 month ago for multifocal CIN 2.  Subjective: Eating a regular diet without difficulty. The patient is not having any pain.  Activity: normal activities of daily living.  She denies fever, chills, nausea and vomiting.   FINAL MICROSCOPIC DIAGNOSIS:   A. ECTOCERVIX, LEEP:  - High grade squamous intraepithelial lesion, CIN-II (moderate  dysplasia).  - Resection margin is negative for high grade dysplasia.  - Low grade dysplasia is present at the endocervical margin.   B. ENDOCERVIX, LEEP:  - Cauterized endocervical mucosa.  - No dysplasia identified.   C. ENDOCERVIX, CURETTAGE:  - Benign endocervical epithelium.   COMMENT:   A. p16 shows strong staining.   Objective: Vital Signs: BP 130/84   Ht 5\' 7"  (1.702 m)   Wt 252 lb (114.3 kg)   BMI 39.47 kg/m  Physical Exam Constitutional:      General: She is not in acute distress.    Appearance: Normal appearance.  Genitourinary:     Bladder and urethral meatus normal.     No lesions in the vagina.     Right Labia: No rash, tenderness, lesions or skin changes.    Left Labia: No tenderness, lesions, skin changes or rash.    No inguinal adenopathy present in the right or left side.    Pelvic Tanner Score: 5/5.    No vaginal discharge, erythema, bleeding or ulceration.     No vaginal prolapse present.     Right Adnexa: not tender, not full and no mass present.    Left Adnexa: not tender, not full and no mass present.    No cervical motion tenderness, discharge, friability, lesion or polyp.     Uterus is not enlarged, fixed or tender.     Uterus is anteverted.  HENT:     Head: Normocephalic and atraumatic.  Eyes:     General: No scleral icterus.    Conjunctiva/sclera: Conjunctivae normal.  Lymphadenopathy:     Lower Body: No right inguinal adenopathy. No left inguinal adenopathy.  Neurological:     General: No focal deficit present.     Mental Status: She  is alert and oriented to person, place, and time.     Cranial Nerves: No cranial nerve deficit.  Psychiatric:        Mood and Affect: Mood normal.        Behavior: Behavior normal.        Judgment: Judgment normal.    Female chaperone present for pelvic exam:   Assessment: 35 y.o. s/p LEEP progressing well  Plan: Patient has done well after surgery with no apparent complications.  I have discussed the post-operative course to date, and the expected progress moving forward.  The patient understands what complications to be concerned about.  I will see the patient in routine follow up, or sooner if needed.    Activity plan: No restriction.  34, MD  02/15/2021, 8:33 AM

## 2021-02-17 DIAGNOSIS — R7611 Nonspecific reaction to tuberculin skin test without active tuberculosis: Secondary | ICD-10-CM

## 2021-02-26 ENCOUNTER — Ambulatory Visit (INDEPENDENT_AMBULATORY_CARE_PROVIDER_SITE_OTHER): Payer: Medicaid Other

## 2021-02-26 ENCOUNTER — Other Ambulatory Visit: Payer: Self-pay

## 2021-02-26 DIAGNOSIS — Z111 Encounter for screening for respiratory tuberculosis: Secondary | ICD-10-CM

## 2021-03-01 ENCOUNTER — Other Ambulatory Visit: Payer: Self-pay | Admitting: Family Medicine

## 2021-03-01 ENCOUNTER — Ambulatory Visit
Admission: RE | Admit: 2021-03-01 | Discharge: 2021-03-01 | Disposition: A | Discharge status: Home / Self Care | Payer: Medicaid Other | Source: Ambulatory Visit | Attending: Family Medicine | Admitting: Family Medicine

## 2021-03-01 ENCOUNTER — Other Ambulatory Visit: Payer: Self-pay

## 2021-03-01 ENCOUNTER — Ambulatory Visit
Admission: RE | Admit: 2021-03-01 | Discharge: 2021-03-01 | Disposition: A | Discharge status: Home / Self Care | Payer: Medicaid Other | Attending: Family Medicine | Admitting: Family Medicine

## 2021-03-01 DIAGNOSIS — R7611 Nonspecific reaction to tuberculin skin test without active tuberculosis: Secondary | ICD-10-CM

## 2021-03-01 MED ORDER — TRIAMCINOLONE ACETONIDE 0.5 % EX CREA: 1.0000 "application " | TOPICAL_CREAM | Freq: Two times a day (BID) | CUTANEOUS | 0 refills | Status: DC

## 2021-03-27 ENCOUNTER — Other Ambulatory Visit: Payer: Self-pay

## 2021-03-27 ENCOUNTER — Emergency Department
Admission: EM | Admit: 2021-03-27 | Discharge: 2021-03-27 | Disposition: A | Discharge status: Home / Self Care | Payer: Medicaid Other | Attending: Emergency Medicine | Admitting: Emergency Medicine

## 2021-03-27 ENCOUNTER — Encounter: Payer: Self-pay | Admitting: Intensive Care

## 2021-03-27 DIAGNOSIS — J029 Acute pharyngitis, unspecified: Secondary | ICD-10-CM | POA: Insufficient documentation

## 2021-03-27 DIAGNOSIS — J4530 Mild persistent asthma, uncomplicated: Secondary | ICD-10-CM | POA: Insufficient documentation

## 2021-03-27 DIAGNOSIS — R059 Cough, unspecified: Secondary | ICD-10-CM | POA: Diagnosis present

## 2021-03-27 DIAGNOSIS — J3089 Other allergic rhinitis: Secondary | ICD-10-CM

## 2021-03-27 DIAGNOSIS — J301 Allergic rhinitis due to pollen: Secondary | ICD-10-CM

## 2021-03-27 DIAGNOSIS — J302 Other seasonal allergic rhinitis: Secondary | ICD-10-CM | POA: Insufficient documentation

## 2021-03-27 HISTORY — DX: Obesity, unspecified: E66.9

## 2021-03-27 MED ORDER — DEXAMETHASONE SODIUM PHOSPHATE 10 MG/ML IJ SOLN
10.0000 mg | Freq: Once | INTRAMUSCULAR | Status: AC
  Administered 2021-03-27: 10 mg via INTRAMUSCULAR
  Filled 2021-03-27: qty 1

## 2021-03-27 MED ORDER — FLUTICASONE PROPIONATE 50 MCG/ACT NA SUSP: 1.0000 | Freq: Every day | NASAL | 0 refills | Status: DC

## 2021-03-27 MED ORDER — BENZONATATE 100 MG PO CAPS: 100.0000 mg | ORAL_CAPSULE | Freq: Three times a day (TID) | ORAL | 0 refills | Status: DC | PRN

## 2021-03-27 MED ORDER — CETIRIZINE HCL 10 MG PO TABS: 10.0000 mg | ORAL_TABLET | Freq: Every day | ORAL | 0 refills | Status: DC

## 2021-03-27 NOTE — Discharge Instructions (Addendum)
He wasYou were seen today for headache, runny nose, postnasal drip and cough.  And is consistent with allergies.  You received a steroid injection for symptom management.  I have sent in Zyrtec and Flonase for you to start taking as prescribed.  I have sent in cough capsules for you to take every 8 hours as needed.  Please follow-up with your PCP if symptoms persist or worsen.

## 2021-03-27 NOTE — ED Provider Notes (Signed)
[facility] Emergency Department Provider Note ____________________________________________  Time seen: 1915  I have reviewed the triage vital signs and the nursing notes.  HISTORY  Chief Complaint  Cough   HPI Jasmine Gordon is a 35 y.o. female presents to the clinic today with complaint of headache, runny nose, postnasal drip, sore throat and cough.  She reports this started 12 days ago.  She reports her headache is located all over her head.  This is intermittent and typically only occurs with coughing.  She is blowing clear mucus out of her nose.  She does have some discomfort in the back of her throat but denies difficulty swallowing.  The cough is nonproductive.  She reports some associated chest heaviness but denies chest tightness, chest pain or shortness of breath.  She denies eye pain, redness, discharge, nasal congestion, ear pain, nausea, vomiting or diarrhea.  She denies fever, chills or body aches.  She has not taken any medications OTC for her symptoms.  She has not had sick contacts or exposure to COVID that she is aware of but she does work in a daycare.  She has had 2 COVID vaccines but not her booster.  She has a history of asthma, has been taking her Albuterol inhaler but reports this has not been effective.  She recently had a positive TB test but chest x-ray from 02/2021 was negative.  Past Medical History:  Diagnosis Date  . Allergy   . Anemia   . Anxiety   . Asthma   . Migraine   . Obese   . Panic attack   . PTSD (post-traumatic stress disorder) 2016    Patient Active Problem List   Diagnosis Date Noted  . Environmental and seasonal allergies 11/04/2020  . Genital warts 09/11/2018  . Positive TB test 09/25/2017  . Allergic contact dermatitis due to metals 05/17/2017  . Pre-diabetes 03/06/2017  . Hyperlipidemia 03/06/2017  . Allergic reaction 02/08/2017  . Urticaria due to food allergy 02/08/2017  . Vitamin D deficiency  11/08/2016  . Iron deficiency anemia due to chronic blood loss 10/03/2016  . Menorrhagia with regular cycle 06/27/2016  . Mild persistent asthma without complication 06/17/2016  . Anxiety and depression 06/17/2016  . Morbid obesity (HCC) 06/17/2016    Past Surgical History:  Procedure Laterality Date  . APPENDECTOMY    . LEEP  12/2020   CIN II, negative margins  . Plantar warts      Prior to Admission medications   Medication Sig Start Date End Date Taking? Authorizing Provider  benzonatate (TESSALON PERLES) 100 MG capsule Take 1 capsule (100 mg total) by mouth 3 (three) times daily as needed for cough. 03/27/21 03/27/22 Yes Advait Buice, Salvadore Oxford, NP  cetirizine (ZYRTEC ALLERGY) 10 MG tablet Take 1 tablet (10 mg total) by mouth daily. 03/27/21 03/27/22 Yes Fain Francis, Salvadore Oxford, NP  albuterol (PROVENTIL) (2.5 MG/3ML) 0.083% nebulizer solution Take 3 mLs (2.5 mg total) by nebulization every 6 (six) hours as needed for wheezing or shortness of breath. 11/04/20   Karamalegos, Netta Neat, DO  albuterol (VENTOLIN HFA) 108 (90 Base) MCG/ACT inhaler Inhale 2 puffs into the lungs every 6 (six) hours as needed for wheezing or shortness of breath. 01/26/21   Karamalegos, Netta Neat, DO  famotidine (PEPCID) 20 MG tablet Take 1 tablet (20 mg total) by mouth 2 (two) times daily for 14 days. 11/16/20 11/30/20  Triplett, Kasandra Knudsen, FNP  fluticasone (FLONASE) 50 MCG/ACT nasal spray Place 1 spray into both  nostrils daily. Use for 4-6 weeks then stop and use seasonally or as needed. 03/27/21   Lorre Munroe, NP  montelukast (SINGULAIR) 10 MG tablet Take 1 tablet (10 mg total) by mouth at bedtime. 11/04/20   Karamalegos, Netta Neat, DO  nortriptyline (PAMELOR) 10 MG capsule Take 10 mg by mouth at bedtime. 10/10/19   [provider]  ondansetron (ZOFRAN) 4 MG tablet Take 1 tablet (4 mg total) by mouth every 8 (eight) hours as needed. 09/24/19   Phineas Semen, MD  triamcinolone cream (KENALOG) 0.5 % Apply 1 application  topically 2 (two) times daily. To affected areas, for up to 2 weeks. 03/01/21   Karamalegos, Netta Neat, DO  vitamin B-12 (CYANOCOBALAMIN) 1000 MCG tablet Take 1 tablet (1,000 mcg total) by mouth daily. 12/02/20   [name], MD    Allergies Reglan [metoclopramide]  Family History  Problem Relation Age of Onset  . Depression Mother   . Mental retardation Mother   . Bipolar disorder Mother   . Diabetes Father   . Stroke Maternal Grandfather   . Diabetes Maternal Grandfather     Social History Social History   Tobacco Use  . Smoking status: Never Smoker  . Smokeless tobacco: Never Used  Vaping Use  . Vaping Use: Never used  Substance Use Topics  . Alcohol use: Yes    Alcohol/week: 1.0 standard drink    Types: 1 Glasses of wine per week  . Drug use: No    Review of Systems  Constitutional: Negative for fever, chills or body aches. Eyes: Negative for eye pain, eye redness or discharge. ENT: Positive for runny nose, postnasal drip.  Negative for nasal congestion, ear pain or sore throat. Cardiovascular: Negative for chest pain or chest tightness. Respiratory: Positive for cough.  Negative for shortness of breath. Gastrointestinal: Negative for nausea, vomiting and diarrhea. Skin: Negative for rash. Neurological: Positive for headaches.  Negative for dizziness. ____________________________________________  PHYSICAL EXAM:  VITAL SIGNS: ED Triage Vitals [03/27/21 1754]  Enc Vitals Group     BP 131/81     Pulse Rate 89     Resp 16     Temp 98.8 F (37.1 C)     Temp Source Oral     SpO2 100 %     Weight 249 lb (112.9 kg)     Height 5\' 7"  (1.702 m)     Head Circumference      Peak Flow      Pain Score 6     Pain Loc      Pain Edu?      Excl. in GC?     Constitutional: Alert and oriented. Well appearing and in no distress. Head: Normocephalic. Eyes: Sclera white.  Conjunctivae are normal. Normal extraocular movements Nose: Mucosa pink and dry.  Turbinates  swollen.  No discharge noted. Mouth/Throat: Mucous membranes are moist.  + PND.  No posterior pharynx erythema or exudate noted. Hematological/Lymphatic/Immunological: No cervical lymphadenopathy. Cardiovascular: Normal rate, regular rhythm.  Respiratory: Normal respiratory effort. No wheezes/rales/rhonchi noted. Neurologic:  Normal speech and language. No gross focal neurologic deficits are appreciated. Skin:  Skin is warm, dry and intact. No rash noted. ____________________________________________  INITIAL IMPRESSION / ASSESSMENT AND PLAN / ED COURSE  Acute Headache, Runny Nose, PND, Cough:  DDx include allergic rhinitis, viral sinusitis, bacterial sinusitis, viral pharyngitis, bacterial pharyngitis, viral URI with cough, asthma exacerbation, acute bronchitis Exam c/w allergic rhinitis I do not think she needs covid/flu testing or repeat chest xray-  pt agrees Decadron 10 mg IM for symptom management RX for Zyrtec 10 mg daily x 7 days RX for Flonase 1 spray each nostril BID x 3 days then daily prn thereafter Return precautions discussed ____________________________________________  FINAL CLINICAL IMPRESSION(S) / ED DIAGNOSES  Final diagnoses:  Seasonal allergic rhinitis due to pollen      Lorre Munroe, NP 03/27/21 Ninfa Linden    Shaune Pollack, MD 03/28/21 [phone]

## 2021-03-27 NOTE — ED Notes (Signed)
Pt awake, alert and oriented x4 sitting up in bed -- no acute distress; no coughing noted at this time. RR even and unlabored on RA with symmetrical rise and fall of chest -- lung sounds CTA.  Abdomen soft, nontender without complaint of n/v/d.  Skin warm dry and intact.  Pt agreeable with plan for d/c home as discussed by provider- this nurse has verbally reinforced d/c instructions and provided pt with written copy - pt acknowledges verbal understanding and denies any additional questions, concerns, needs.

## 2021-03-27 NOTE — ED Triage Notes (Signed)
Patient c/o cough Since Tuesday

## 2021-03-29 ENCOUNTER — Telehealth: Payer: Self-pay

## 2021-03-29 NOTE — Telephone Encounter (Signed)
Transition Care Management Unsuccessful Follow-up Telephone Call  Date of discharge and from where:  03/27/2021 from [facility]  Attempts:  1st Attempt  Reason for unsuccessful TCM follow-up call:  Left voice message

## 2021-03-30 NOTE — Telephone Encounter (Signed)
Transition Care Management Follow-up Telephone Call  Date of discharge and from where: 03/27/2021 from [facility]   How have you been since you were released from the hospital? Pt stated that she is feeling some better. There are still some lingering symptoms. Pt has started the new meds rx'ed by the ED.   Any questions or concerns? No  Items Reviewed:  Did the pt receive and understand the discharge instructions provided? Yes   Medications obtained and verified? Yes   Other? No   Any new allergies since your discharge? No   Dietary orders reviewed? n/a  Do you have support at home? Yes   Functional Questionnaire: (I = Independent and D = Dependent) ADLs: I  Bathing/Dressing- I  Meal Prep- I  Eating- I  Maintaining continence- I  Transferring/Ambulation- I  Managing Meds- I   Follow up appointments reviewed:   PCP Hospital f/u appt confirmed? Yes    Specialist Hospital f/u appt confirmed? No    Are transportation arrangements needed? No   If their condition worsens, is the pt aware to call PCP or go to the Emergency Dept.? Yes  Was the patient provided with contact information for the PCP's office or ED? Yes  Was to pt encouraged to call back with questions or concerns? Yes

## 2021-04-09 ENCOUNTER — Other Ambulatory Visit: Payer: Self-pay

## 2021-04-09 DIAGNOSIS — J4530 Mild persistent asthma, uncomplicated: Secondary | ICD-10-CM

## 2021-04-09 MED ORDER — ALBUTEROL SULFATE HFA 108 (90 BASE) MCG/ACT IN AERS: 2.0000 | INHALATION_SPRAY | Freq: Four times a day (QID) | RESPIRATORY_TRACT | 3 refills | Status: DC | PRN

## 2021-04-29 ENCOUNTER — Other Ambulatory Visit: Payer: Medicaid Other

## 2021-05-06 ENCOUNTER — Other Ambulatory Visit: Payer: Self-pay

## 2021-05-06 ENCOUNTER — Encounter: Payer: Self-pay | Admitting: Family Medicine

## 2021-05-06 ENCOUNTER — Ambulatory Visit: Payer: Medicaid Other | Admitting: Family Medicine

## 2021-05-06 VITALS — BP 124/81 | HR 99 | Ht 67.0 in | Wt 251.4 lb

## 2021-05-06 DIAGNOSIS — R7303 Prediabetes: Secondary | ICD-10-CM | POA: Diagnosis not present

## 2021-05-06 LAB — POCT GLYCOSYLATED HEMOGLOBIN (HGB A1C): Hemoglobin A1C: 6.1 % — AB (ref 4.0–5.6)

## 2021-05-06 NOTE — Progress Notes (Signed)
Subjective:    Patient ID: Jasmine Gordon, female    DOB: [birth year], 35 y.o.   MRN: [id number]  Jasmine Gordon is a 35 y.o. female presenting on 05/06/2021 for Asthma, Anxiety, and Prediabetes   HPI  PreDM Morbid Obesity Concern with some wt gain and difficulty wt loss, mostly with central weight. She has plans to restart diet/lifestyle plan. She says work has intervened with her schedule and limited her exercise. A1c today Meds: failed Metformin in past. Not used GLP1 Lifestyle: - Diet (goal to improve, admits more carb lately)  - Exercise (limited lately now plan to resume plan) Denies hypoglycemia, polyuria, visual changes, numbness or tingling.  PMH Hyperlipidemia    Anxiety On Buspar PRN Previous episodes described anxiety panic with sweating, heart racing tachycardia Improved overall Admits more manageable, seems to be right before  menstrual cycle.    Depression screen [facility] 2/9 05/06/2021 11/04/2020 01/01/2020  Decreased Interest 0 0 3  Down, Depressed, Hopeless 0 0 3  PHQ - 2 Score 0 0 6  Altered sleeping 0 - 3  Tired, decreased energy 1 - 1  Change in appetite 0 - 1  Feeling bad or failure about yourself  1 - 1  Trouble concentrating 0 - 3  Moving slowly or fidgety/restless 0 - 0  Suicidal thoughts 0 - 0  PHQ-9 Score 2 - 15  Difficult doing work/chores Not difficult at all - Extremely dIfficult  Some recent data might be hidden    Social History   Tobacco Use   Smoking status: Never   Smokeless tobacco: Never  Vaping Use   Vaping Use: Never used  Substance Use Topics   Alcohol use: Yes    Alcohol/week: 1.0 standard drink    Types: 1 Glasses of wine per week   Drug use: No    Review of Systems Per HPI unless specifically indicated above     Objective:    BP 124/81   Pulse 99   Ht 5\' 7"  (1.702 m)   Wt 251 lb 6.4 oz (114 kg)   SpO2 100%   BMI 39.37 kg/m   Wt Readings from Last 3 Encounters:  05/06/21 251 lb 6.4 oz (114 kg)   03/27/21 249 lb (112.9 kg)  02/15/21 252 lb (114.3 kg)    Physical Exam Vitals and nursing note reviewed.  Constitutional:      General: She is not in acute distress.    Appearance: Normal appearance. She is well-developed. She is obese. She is not diaphoretic.     Comments: Well-appearing, comfortable, cooperative  HENT:     Head: Normocephalic and atraumatic.  Eyes:     General:        Right eye: No discharge.        Left eye: No discharge.     Conjunctiva/sclera: Conjunctivae normal.  Cardiovascular:     Rate and Rhythm: Normal rate.  Pulmonary:     Effort: Pulmonary effort is normal.  Skin:    General: Skin is warm and dry.     Findings: No erythema or rash.  Neurological:     Mental Status: She is alert and oriented to person, place, and time.  Psychiatric:        Mood and Affect: Mood normal.        Behavior: Behavior normal.        Thought Content: Thought content normal.     Comments: Well groomed, good eye contact, normal speech and thoughts  Results for orders placed or performed in visit on 05/06/21  POCT HgB A1C  Result Value Ref Range   Hemoglobin A1C 6.1 (A) 4.0 - 5.6 %      Assessment & Plan:   Problem List Items Addressed This Visit     Pre-diabetes - Primary   Relevant Orders   POCT HgB A1C (Completed)   Morbid obesity (HCC)    PreDM  Morbid Obesity Elevated A1c to 6.1 Weight gain abnormal Counseling on weight management options Encourage continue diet lifestyle exercise regimen Consider GLP1 therapy, handout given, will check insurance, notify us when if ready to pursue free sample and then rx if covered  Anxiety improved currently.  Anemia, followed by Hematology, upcoming labs and follow up   No orders of the defined types were placed in this encounter.     Follow up plan: Return in about 3 months (around 08/06/2021) for 3 month follow-up PreDM A1c.   [name], DO [facility] 05/06/2021, 9:05 AM

## 2021-05-06 NOTE — Patient Instructions (Addendum)
Thank you for coming to the office today.  Recent Labs    11/04/20 0950 05/06/21 0907  HGBA1C 5.9* 6.1*      Call insurance find cost and coverage of the following   Pre-Diabetes / Diabetes  Ozempic (Semaglutide injection) - start 0.25mg  weekly for 4 weeks then increase to 0.5mg  weekly    Trulicity (Dulaglutide) - once weekly - this is very good one, usually one of my top choices as well, two doses, 0.75 (likely we would start) and 1.5 max dose  Victoza (once daily)  ----  FDA approved for weight loss / obesity  Saxenda (once daily) Wegovy ("ozempic" once week) - has had some back order issues, stocking in pharmacy.   We can get these newer meds at low cost if you are interested.  3 benefits - 1 significantly reduced A1c sugar, and may be able to reduce or stop metformin in future - 2 reduced appetite and weight loss with good results - 3 cardiovascular risk reduction, less likely to have heart attack/stroke   Please schedule a Follow-up Appointment to: Return in about 3 months (around 08/06/2021) for 3 month follow-up PreDM A1c.  If you have any other questions or concerns, please feel free to call the office or send a message through MyChart. You may also schedule an earlier appointment if necessary.  Additionally, you may be receiving a survey about your experience at our office within a few days to 1 week by e-mail or mail. We value your feedback.  [name], DO [facility], [place]

## 2021-05-07 ENCOUNTER — Telehealth: Payer: Self-pay | Admitting: Family Medicine

## 2021-05-07 DIAGNOSIS — R7303 Prediabetes: Secondary | ICD-10-CM

## 2021-05-07 MED ORDER — TRULICITY 0.75 MG/0.5ML ~~LOC~~ SOAJ: 0.7500 mg | SUBCUTANEOUS | 2 refills | Status: DC

## 2021-05-07 NOTE — Telephone Encounter (Signed)
See her mychart message for more detail and last visit.  Will start requested Trulicity 0.75mg  weekly injection.  New rx sent.  Will need Prior Authorization  She has failed metformin XR 500mg  dosing back in 2018-2019.  She may pick up free sample Trulicity 0.75mg  pens = 2 pens = 1 box = 2 weeks next week if she prefers.  06-06-1973, DO [facility] 05/07/2021, 4:23 PM

## 2021-05-21 ENCOUNTER — Encounter: Payer: Self-pay | Admitting: Oncology

## 2021-05-21 ENCOUNTER — Other Ambulatory Visit: Payer: Self-pay

## 2021-05-21 ENCOUNTER — Inpatient Hospital Stay: Payer: Medicaid Other | Attending: Oncology

## 2021-05-21 DIAGNOSIS — Z79899 Other long term (current) drug therapy: Secondary | ICD-10-CM | POA: Insufficient documentation

## 2021-05-21 DIAGNOSIS — D500 Iron deficiency anemia secondary to blood loss (chronic): Secondary | ICD-10-CM

## 2021-05-21 DIAGNOSIS — D649 Anemia, unspecified: Secondary | ICD-10-CM

## 2021-05-21 LAB — CBC WITH DIFFERENTIAL/PLATELET
Abs Immature Granulocytes: 0.01 10*3/uL (ref 0.00–0.07)
Basophils Absolute: 0 10*3/uL (ref 0.0–0.1)
Basophils Relative: 1 %
Eosinophils Absolute: 0.2 10*3/uL (ref 0.0–0.5)
Eosinophils Relative: 4 %
HCT: 35.6 % — ABNORMAL LOW (ref 36.0–46.0)
Hemoglobin: 11.2 g/dL — ABNORMAL LOW (ref 12.0–15.0)
Immature Granulocytes: 0 %
Lymphocytes Relative: 41 %
Lymphs Abs: 1.7 10*3/uL (ref 0.7–4.0)
MCH: 26.4 pg (ref 26.0–34.0)
MCHC: 31.5 g/dL (ref 30.0–36.0)
MCV: 84 fL (ref 80.0–100.0)
Monocytes Absolute: 0.3 10*3/uL (ref 0.1–1.0)
Monocytes Relative: 7 %
Neutro Abs: 2 10*3/uL (ref 1.7–7.7)
Neutrophils Relative %: 47 %
Platelets: 226 10*3/uL (ref 150–400)
RBC: 4.24 MIL/uL (ref 3.87–5.11)
RDW: 13 % (ref 11.5–15.5)
WBC: 4.2 10*3/uL (ref 4.0–10.5)
nRBC: 0 % (ref 0.0–0.2)

## 2021-05-21 LAB — RETIC PANEL
Immature Retic Fract: 7.1 % (ref 2.3–15.9)
RBC.: 4.22 MIL/uL (ref 3.87–5.11)
Retic Count, Absolute: 49 10*3/uL (ref 19.0–186.0)
Retic Ct Pct: 1.2 % (ref 0.4–3.1)
Reticulocyte Hemoglobin: 30.8 pg (ref >27.9)

## 2021-05-21 LAB — IRON AND TIBC
Iron: 69 ug/dL (ref 28–170)
Saturation Ratios: 21 % (ref 10.4–31.8)
TIBC: 322 ug/dL (ref 250–450)
UIBC: 253 ug/dL

## 2021-05-21 LAB — FERRITIN: Ferritin: 26 ng/mL (ref 11–307)

## 2021-05-26 NOTE — Telephone Encounter (Signed)
Please advise. Pt had labs on 7/1.

## 2021-05-26 NOTE — Telephone Encounter (Signed)
Please advise 

## 2021-05-28 ENCOUNTER — Other Ambulatory Visit: Payer: Medicaid Other

## 2021-05-31 ENCOUNTER — Encounter: Payer: Self-pay | Admitting: Oncology

## 2021-05-31 ENCOUNTER — Inpatient Hospital Stay (HOSPITAL_BASED_OUTPATIENT_CLINIC_OR_DEPARTMENT_OTHER): Payer: Medicaid Other | Admitting: Oncology

## 2021-05-31 DIAGNOSIS — E538 Deficiency of other specified B group vitamins: Secondary | ICD-10-CM | POA: Diagnosis not present

## 2021-05-31 DIAGNOSIS — D649 Anemia, unspecified: Secondary | ICD-10-CM | POA: Diagnosis not present

## 2021-05-31 DIAGNOSIS — Z79899 Other long term (current) drug therapy: Secondary | ICD-10-CM | POA: Diagnosis not present

## 2021-05-31 DIAGNOSIS — D500 Iron deficiency anemia secondary to blood loss (chronic): Secondary | ICD-10-CM | POA: Diagnosis not present

## 2021-05-31 NOTE — Progress Notes (Signed)
HEMATOLOGY-ONCOLOGY TeleHEALTH VISIT PROGRESS NOTE  I connected with Jasmine Gordon on 05/31/21 at  2:30 PM EDT by video enabled telemedicine visit and verified that I am speaking with the correct person using two identifiers. I discussed the limitations, risks, security and privacy concerns of performing an evaluation and management service by telemedicine and the availability of in-person appointments. I also discussed with the patient that there may be a patient responsible charge related to this service. The patient expressed understanding and agreed to proceed.   Other persons participating in the visit and their role in the encounter:  None  Patient's location: she is in her car, was not driving during the encounter Provider's location: office Chief Complaint: Iron deficiency anemia   INTERVAL HISTORY Jasmine Gordon is a 35 y.o. female who has above history reviewed by me today presents for follow up visit for management of iron deficiency anemia Problems and complaints are listed below:  Patient reports feeling well today.  No new complaints.   Review of Systems  Constitutional:  Positive for fatigue. Negative for appetite change, chills and fever.  HENT:   Negative for hearing loss and voice change.   Eyes:  Negative for eye problems.  Respiratory:  Negative for chest tightness and cough.   Cardiovascular:  Negative for chest pain.  Gastrointestinal:  Negative for abdominal distention, abdominal pain and blood in stool.  Endocrine: Negative for hot flashes.  Genitourinary:  Negative for difficulty urinating and frequency.   Musculoskeletal:  Negative for arthralgias.  Skin:  Negative for itching and rash.  Neurological:  Negative for extremity weakness.  Hematological:  Negative for adenopathy.  Psychiatric/Behavioral:  Negative for confusion.    Past Medical History:  Diagnosis Date   Allergy    Anemia    Anxiety    Asthma    Migraine    Obese    Panic  attack    PTSD (post-traumatic stress disorder) 2016   Past Surgical History:  Procedure Laterality Date   APPENDECTOMY     LEEP  12/2020   CIN II, negative margins   Plantar warts      Family History  Problem Relation Age of Onset   Diabetes Mother    Depression Mother    Mental retardation Mother    Bipolar disorder Mother    Diabetes Father    Stroke Maternal Grandfather    Diabetes Maternal Grandfather     Social History   Socioeconomic History   Marital status: Married    Spouse name: Not on file   Number of children: Not on file   Years of education: Not on file   Highest education level: Not on file  Occupational History   Not on file  Tobacco Use   Smoking status: Never   Smokeless tobacco: Never  Vaping Use   Vaping Use: Never used  Substance and Sexual Activity   Alcohol use: Yes    Alcohol/week: 1.0 standard drink    Types: 1 Glasses of wine per week   Drug use: No   Sexual activity: Yes    Birth control/protection: Condom  Other Topics Concern   Not on file  Social History Narrative   Not on file   Social Determinants of Health   Financial Resource Strain: Not on file  Food Insecurity: Not on file  Transportation Needs: Not on file  Physical Activity: Not on file  Stress: Not on file  Social Connections: Not on file  Intimate Partner Violence: Not  on file    Current Outpatient Medications on File Prior to Visit  Medication Sig Dispense Refill   albuterol (PROVENTIL) (2.5 MG/3ML) 0.083% nebulizer solution Take 3 mLs (2.5 mg total) by nebulization every 6 (six) hours as needed for wheezing or shortness of breath. 150 mL 1   albuterol (VENTOLIN HFA) 108 (90 Base) MCG/ACT inhaler Inhale 2 puffs into the lungs every 6 (six) hours as needed for wheezing or shortness of breath. 8 g 3   busPIRone (BUSPAR) 5 MG tablet Take 5 mg by mouth as needed.     cetirizine (ZYRTEC ALLERGY) 10 MG tablet Take 1 tablet (10 mg total) by mouth daily. 14 tablet 0    fluticasone (FLONASE) 50 MCG/ACT nasal spray Place 1 spray into both nostrils daily. Use for 4-6 weeks then stop and use seasonally or as needed. 16 g 0   montelukast (SINGULAIR) 10 MG tablet Take 1 tablet (10 mg total) by mouth at bedtime. 90 tablet 3   nortriptyline (PAMELOR) 10 MG capsule Take 10 mg by mouth at bedtime.     omeprazole (PRILOSEC) 40 MG capsule Take 40 mg by mouth daily.     ondansetron (ZOFRAN) 4 MG tablet Take 1 tablet (4 mg total) by mouth every 8 (eight) hours as needed. 20 tablet 0   triamcinolone cream (KENALOG) 0.5 % Apply 1 application topically 2 (two) times daily. To affected areas, for up to 2 weeks. 30 g 0   TRULICITY 0.75 MG/0.5ML SOPN Inject 0.75 mg into the skin once a week. 2 mL 2   vitamin B-12 (CYANOCOBALAMIN) 1000 MCG tablet Take 1 tablet (1,000 mcg total) by mouth daily. 90 tablet 1   famotidine (PEPCID) 20 MG tablet Take 1 tablet (20 mg total) by mouth 2 (two) times daily for 14 days. (Patient not taking: Reported on 05/31/2021) 28 tablet 0   No current facility-administered medications on file prior to visit.    Allergies  Allergen Reactions   Reglan [Metoclopramide] Hives and Shortness Of Breath       Observations/Objective: There were no vitals filed for this visit.  There is no height or weight on file to calculate BMI.  Physical Exam Constitutional:      General: She is not in acute distress.   CBC    Component Value Date/Time   WBC 4.2 05/21/2021 1350   RBC 4.24 05/21/2021 1350   RBC 4.22 05/21/2021 1350   HGB 11.2 (L) 05/21/2021 1350   HGB 12.1 09/17/2014 1148   HCT 35.6 (L) 05/21/2021 1350   HCT 37.4 09/17/2014 1148   PLT 226 05/21/2021 1350   PLT 202 09/17/2014 1148   MCV 84.0 05/21/2021 1350   MCV 83 09/17/2014 1148   MCH 26.4 05/21/2021 1350   MCHC 31.5 05/21/2021 1350   RDW 13.0 05/21/2021 1350   RDW 14.1 09/17/2014 1148   LYMPHSABS 1.7 05/21/2021 1350   LYMPHSABS 2.1 09/17/2014 1148   MONOABS 0.3 05/21/2021 1350    MONOABS 0.3 09/17/2014 1148   EOSABS 0.2 05/21/2021 1350   EOSABS 0.1 09/17/2014 1148   BASOSABS 0.0 05/21/2021 1350   BASOSABS 0.0 09/17/2014 1148    CMP     Component Value Date/Time   NA 137 01/11/2021 1720   NA 140 09/17/2014 1148   K 3.6 01/11/2021 1720   K 3.5 09/17/2014 1148   CL 104 01/11/2021 1720   CL 107 09/17/2014 1148   CO2 24 01/11/2021 1720   CO2 24 09/17/2014 1148   GLUCOSE  115 (H) 01/11/2021 1720   GLUCOSE 101 (H) 09/17/2014 1148   BUN 17 01/11/2021 1720   BUN 11 09/17/2014 1148   CREATININE 0.82 01/11/2021 1720   CREATININE 0.73 11/04/2020 0950   CALCIUM 9.4 01/11/2021 1720   CALCIUM 8.7 09/17/2014 1148   PROT 6.9 11/04/2020 0950   PROT 7.5 09/17/2014 1148   ALBUMIN 4.2 02/21/2020 0346   ALBUMIN 3.7 09/17/2014 1148   AST 3 (L) 11/04/2020 0950   AST 13 (L) 09/17/2014 1148   ALT 9 11/04/2020 0950   ALT 16 09/17/2014 1148   ALKPHOS 43 02/21/2020 0346   ALKPHOS 57 09/17/2014 1148   BILITOT 0.4 11/04/2020 0950   BILITOT 0.4 09/17/2014 1148   GFRNONAA >60 01/11/2021 1720   GFRNONAA 109 11/04/2020 0950   GFRAA 126 11/04/2020 0950     Assessment and Plan: 1. Iron deficiency anemia due to chronic blood loss   2. Normocytic anemia   3. Low serum vitamin B12   IDA Labs are reviewed and discussed with patient. Hemoglobin has slightly decreased, 11.2, still close to her baseline.  Iron panel is stable and normal.  No need for IV iron Continue monitor Hemoglobinopathy evaluation showed normal pattern. Will check alpha thalassemia PCR.   Chronic borderline B12 level,  Recommend patient to take vimtain b12 supplementation. Check B12 at next visit.    Follow Up Instructions: 6 months with lab and MD   I discussed the assessment and treatment plan with the patient. The patient was provided an opportunity to ask questions and all were answered. The patient agreed with the plan and demonstrated an understanding of the instructions.  The patient was advised  to call back or seek an in-person evaluation if the symptoms worsen or if the condition fails to improve as anticipated.     [name], MD 05/31/2021 8:50 PM

## 2021-05-31 NOTE — Progress Notes (Signed)
Patient contacted for Mychart visit. No new concerns voiced.  

## 2021-07-24 ENCOUNTER — Encounter: Payer: Self-pay | Admitting: Family Medicine

## 2021-07-27 ENCOUNTER — Other Ambulatory Visit: Payer: Self-pay

## 2021-07-27 DIAGNOSIS — J4530 Mild persistent asthma, uncomplicated: Secondary | ICD-10-CM

## 2021-07-27 DIAGNOSIS — E538 Deficiency of other specified B group vitamins: Secondary | ICD-10-CM

## 2021-07-27 DIAGNOSIS — E559 Vitamin D deficiency, unspecified: Secondary | ICD-10-CM

## 2021-07-27 MED ORDER — VITAMIN D (ERGOCALCIFEROL) 1.25 MG (50000 UNIT) PO CAPS: 50000.0000 [IU] | ORAL_CAPSULE | ORAL | 1 refills | Status: DC

## 2021-07-27 MED ORDER — ALBUTEROL SULFATE HFA 108 (90 BASE) MCG/ACT IN AERS: 2.0000 | INHALATION_SPRAY | Freq: Four times a day (QID) | RESPIRATORY_TRACT | 3 refills | Status: DC | PRN

## 2021-07-27 MED ORDER — VITAMIN B-12 1000 MCG PO TABS: 1000.0000 ug | ORAL_TABLET | Freq: Every day | ORAL | 1 refills | Status: DC

## 2021-08-12 ENCOUNTER — Encounter: Payer: Self-pay | Admitting: Family Medicine

## 2021-08-12 DIAGNOSIS — F411 Generalized anxiety disorder: Secondary | ICD-10-CM

## 2021-08-13 MED ORDER — SERTRALINE HCL 50 MG PO TABS
50.0000 mg | ORAL_TABLET | Freq: Every day | ORAL | 2 refills | Status: DC
Start: 2021-08-13 — End: 2021-12-28

## 2021-08-13 MED ORDER — BUSPIRONE HCL 5 MG PO TABS: 5.0000 mg | ORAL_TABLET | Freq: Two times a day (BID) | ORAL | 2 refills | Status: DC | PRN

## 2021-08-13 NOTE — Addendum Note (Signed)
Addended by: [name] on: 08/13/2021 06:35 PM   Modules accepted: Orders

## 2021-08-17 ENCOUNTER — Emergency Department: Payer: Medicaid Other

## 2021-08-17 ENCOUNTER — Emergency Department
Admission: EM | Admit: 2021-08-17 | Discharge: 2021-08-17 | Disposition: A | Discharge status: Home / Self Care | Payer: Medicaid Other | Attending: Emergency Medicine | Admitting: Emergency Medicine

## 2021-08-17 ENCOUNTER — Encounter: Payer: Self-pay | Admitting: Emergency Medicine

## 2021-08-17 ENCOUNTER — Other Ambulatory Visit: Payer: Self-pay

## 2021-08-17 DIAGNOSIS — R079 Chest pain, unspecified: Secondary | ICD-10-CM | POA: Diagnosis not present

## 2021-08-17 DIAGNOSIS — R0789 Other chest pain: Secondary | ICD-10-CM | POA: Diagnosis not present

## 2021-08-17 DIAGNOSIS — R0602 Shortness of breath: Secondary | ICD-10-CM | POA: Diagnosis not present

## 2021-08-17 DIAGNOSIS — J4530 Mild persistent asthma, uncomplicated: Secondary | ICD-10-CM | POA: Diagnosis not present

## 2021-08-17 LAB — BASIC METABOLIC PANEL
Anion gap: 8 (ref 5–15)
BUN: 12 mg/dL (ref 6–20)
CO2: 26 mmol/L (ref 22–32)
Calcium: 9.3 mg/dL (ref 8.9–10.3)
Chloride: 106 mmol/L (ref 98–111)
Creatinine, Ser: 0.93 mg/dL (ref 0.44–1.00)
GFR, Estimated: >60 mL/min (ref >60)
Glucose, Bld: 84 mg/dL (ref 70–99)
Potassium: 3.6 mmol/L (ref 3.5–5.1)
Sodium: 140 mmol/L (ref 135–145)

## 2021-08-17 LAB — CBC
HCT: 36.6 % (ref 36.0–46.0)
Hemoglobin: 11.5 g/dL — ABNORMAL LOW (ref 12.0–15.0)
MCH: 26.3 pg (ref 26.0–34.0)
MCHC: 31.4 g/dL (ref 30.0–36.0)
MCV: 83.6 fL (ref 80.0–100.0)
Platelets: 223 10*3/uL (ref 150–400)
RBC: 4.38 MIL/uL (ref 3.87–5.11)
RDW: 12.9 % (ref 11.5–15.5)
WBC: 3.9 10*3/uL — ABNORMAL LOW (ref 4.0–10.5)
nRBC: 0 % (ref 0.0–0.2)

## 2021-08-17 LAB — TROPONIN I (HIGH SENSITIVITY): Troponin I (High Sensitivity): <2 ng/L (ref <18)

## 2021-08-17 NOTE — Discharge Instructions (Signed)
Your EKG, blood work and chest x-ray were all reassuring today.  If you continue to experience pain, please follow-up with your primary care provider.  If your pain is worsening or you have new symptoms that are concerning to you, please return to the emergency department.

## 2021-08-17 NOTE — ED Triage Notes (Signed)
Pt to ED via POV stating that she has been having an "aching" feeling in her chest for the past few days. Pt states that she feeling goes up into her head and neck. Pt reports that the pain comes and goes and that she felt like she was having hot flashes. Pt states that her neck and head felt very tense for several days. Pt is in NAD.

## 2021-08-17 NOTE — ED Provider Notes (Signed)
[facility]  ____________________________________________   Event Date/Time   First MD Initiated Contact with Patient 08/17/21 1515     (approximate)  I have reviewed the triage vital signs and the nursing notes.   HISTORY  Chief Complaint Chest Pain    HPI Jasmine Gordon is a 35 y.o. female past medical history of migraines, asthma, PTSD who presents with chest pain.  Symptoms been going on for about a week.  She endorses a mild pressure sensation in the anterior chest that is intermittent.  Not worse with exertion or breathing.  She also does endorse some shortness of breath.  No cough, fevers or chills.  Denies nausea or diaphoresis.The patient denies hx of prior DVT/PE, unilateral leg pain/swelling, hormone use, recent surgery, hx of cancer, prolonged immobilization, or hemoptysis.           Past Medical History:  Diagnosis Date   Allergy    Anemia    Anxiety    Asthma    Migraine    Obese    Panic attack    PTSD (post-traumatic stress disorder) 2016    Patient Active Problem List   Diagnosis Date Noted   Environmental and seasonal allergies 11/04/2020   Genital warts 09/11/2018   Positive TB test 09/25/2017   Allergic contact dermatitis due to metals 05/17/2017   Pre-diabetes 03/06/2017   Hyperlipidemia 03/06/2017   Allergic reaction 02/08/2017   Urticaria due to food allergy 02/08/2017   Vitamin D deficiency 11/08/2016   Iron deficiency anemia due to chronic blood loss 10/03/2016   Menorrhagia with regular cycle 06/27/2016   Mild persistent asthma without complication 06/17/2016   Anxiety and depression 06/17/2016   Morbid obesity (HCC) 06/17/2016    Past Surgical History:  Procedure Laterality Date   APPENDECTOMY     LEEP  12/2020   CIN II, negative margins   Plantar warts      Prior to Admission medications   Medication Sig Start Date End Date Taking? Authorizing Provider  albuterol (VENTOLIN HFA) 108 (90 Base)  MCG/ACT inhaler Inhale 2 puffs into the lungs every 6 (six) hours as needed for wheezing or shortness of breath. 07/27/21   Karamalegos, Netta Neat, DO  busPIRone (BUSPAR) 5 MG tablet Take 1 tablet (5 mg total) by mouth 2 (two) times daily as needed (anxiety). 08/13/21   Karamalegos, Netta Neat, DO  cetirizine (ZYRTEC ALLERGY) 10 MG tablet Take 1 tablet (10 mg total) by mouth daily. 03/27/21 03/27/22  Lorre Munroe, NP  famotidine (PEPCID) 20 MG tablet Take 1 tablet (20 mg total) by mouth 2 (two) times daily for 14 days. Patient not taking: Reported on 05/31/2021 11/16/20 11/30/20  Chinita Pester, FNP  fluticasone (FLONASE) 50 MCG/ACT nasal spray Place 1 spray into both nostrils daily. Use for 4-6 weeks then stop and use seasonally or as needed. 03/27/21   Lorre Munroe, NP  montelukast (SINGULAIR) 10 MG tablet Take 1 tablet (10 mg total) by mouth at bedtime. 11/04/20   Karamalegos, Netta Neat, DO  nortriptyline (PAMELOR) 10 MG capsule Take 10 mg by mouth at bedtime. 10/10/19   [provider]  omeprazole (PRILOSEC) 40 MG capsule Take 40 mg by mouth daily. 09/26/18   [provider]  ondansetron (ZOFRAN) 4 MG tablet Take 1 tablet (4 mg total) by mouth every 8 (eight) hours as needed. 09/24/19   Phineas Semen, MD  sertraline (ZOLOFT) 50 MG tablet Take 1 tablet (50 mg total) by mouth daily.  08/13/21   Karamalegos, Netta Neat, DO  triamcinolone cream (KENALOG) 0.5 % Apply 1 application topically 2 (two) times daily. To affected areas, for up to 2 weeks. 03/01/21   Karamalegos, Alexander J, DO  TRULICITY 0.75 MG/0.5ML SOPN Inject 0.75 mg into the skin once a week. 05/07/21   Karamalegos, Netta Neat, DO  vitamin B-12 (CYANOCOBALAMIN) 1000 MCG tablet Take 1 tablet (1,000 mcg total) by mouth daily. 07/27/21   Karamalegos, Netta Neat, DO  Vitamin D, Ergocalciferol, (DRISDOL) 1.25 MG (50000 UNIT) CAPS capsule Take 1 capsule (50,000 Units total) by mouth every 7 (seven) days. 07/27/21   Karamalegos,  Netta Neat, DO    Allergies Reglan [metoclopramide]  Family History  Problem Relation Age of Onset   Diabetes Mother    Depression Mother    Mental retardation Mother    Bipolar disorder Mother    Diabetes Father    Stroke Maternal Grandfather    Diabetes Maternal Grandfather     Social History Social History   Tobacco Use   Smoking status: Never   Smokeless tobacco: Never  Vaping Use   Vaping Use: Never used  Substance Use Topics   Alcohol use: Yes    Alcohol/week: 1.0 standard drink    Types: 1 Glasses of wine per week   Drug use: No    Review of Systems   Review of Systems  Constitutional:  Negative for chills and fever.  Respiratory:  Positive for chest tightness and shortness of breath. Negative for cough and choking.   Cardiovascular:  Positive for chest pain. Negative for palpitations and leg swelling.  Gastrointestinal:  Negative for abdominal pain, nausea and vomiting.  All other systems reviewed and are negative.  Physical Exam Updated Vital Signs BP 123/83   Pulse (!) 110   Temp 98.7 F (37.1 C) (Oral)   Resp 20   Ht 5\' 7"  (1.702 m)   Wt 110.2 kg   SpO2 100%   BMI 38.06 kg/m   Physical Exam Vitals and nursing note reviewed.  Constitutional:      General: She is not in acute distress.    Appearance: Normal appearance.  HENT:     Head: Normocephalic and atraumatic.  Eyes:     General: No scleral icterus.    Conjunctiva/sclera: Conjunctivae normal.  Cardiovascular:     Heart sounds: Normal heart sounds.  Pulmonary:     Effort: Pulmonary effort is normal. No respiratory distress.     Breath sounds: Normal breath sounds. No stridor.  Musculoskeletal:        General: No deformity or signs of injury.     Cervical back: Normal range of motion.     Right lower leg: No edema.     Left lower leg: No edema.  Skin:    General: Skin is dry.     Coloration: Skin is not jaundiced or pale.  Neurological:     General: No focal deficit present.      Mental Status: She is alert and oriented to person, place, and time. Mental status is at baseline.  Psychiatric:        Mood and Affect: Mood normal.        Behavior: Behavior normal.     LABS (all labs ordered are listed, but only abnormal results are displayed)  Labs Reviewed  CBC - Abnormal; Notable for the following components:      Result Value   WBC 3.9 (*)    Hemoglobin 11.5 (*)  All other components within normal limits  BASIC METABOLIC PANEL  POC URINE PREG, ED  TROPONIN I (HIGH SENSITIVITY)  TROPONIN I (HIGH SENSITIVITY)   ____________________________________________  EKG  NSR, nml axis, nml intervals, no acute ischemic changes  ____________________________________________  RADIOLOGY Ky Barban, personally viewed and evaluated these images (plain radiographs) as part of my medical decision making, as well as reviewing the written report by the radiologist.  ED MD interpretation:  I reviewed the CXR which does not show any acute cardiopulmonary process      ____________________________________________   PROCEDURES  Procedure(s) performed (including Critical Care):  Procedures   ____________________________________________   INITIAL IMPRESSION / ASSESSMENT AND PLAN / ED COURSE     35 year old female presenting with chest pain x1 week.  She is here with her son who has viral symptoms.  Vital signs in triage notable initially for tachycardia of 110, however on my evaluation heart rate is in the 80s without any intervention.  Her EKG is nonischemic.  Troponin is negative.  Exam is reassuring.  Normal lung exam, no signs of DVT.  My suspicion for ACS is exceedingly low given her lack of risk factors and normal work-up here.  Consider pulmonary embolism given her initial tachycardia she does not PERC out.  However tachycardia resolved and my suspicion overall is low.  We discussed return precautions.  Patient stable for discharge.       ____________________________________________   FINAL CLINICAL IMPRESSION(S) / ED DIAGNOSES  Final diagnoses:  Chest pain, unspecified type     ED Discharge Orders     None        Note:  This document was prepared using Dragon voice recognition software and may include unintentional dictation errors.    Georga Hacking, MD 08/17/21 [phone]

## 2021-08-18 ENCOUNTER — Telehealth: Payer: Self-pay

## 2021-08-18 NOTE — Telephone Encounter (Signed)
Transition Care Management Follow-up Telephone Call Date of discharge and from where: 08/17/2021-[facility] How have you been since you were released from the hospital? Patient stated she is still feeling drained.  Any questions or concerns? No  Items Reviewed: Did the pt receive and understand the discharge instructions provided? Yes  Medications obtained and verified?  No medications given Other? No  Any new allergies since your discharge? No  Dietary orders reviewed? No Do you have support at home? Yes   Home Care and Equipment/Supplies: Were home health services ordered? not applicable If so, what is the name of the agency? N/A  Has the agency set up a time to come to the patient's home? not applicable Were any new equipment or medical supplies ordered?  No What is the name of the medical supply agency? N/A Were you able to get the supplies/equipment? not applicable Do you have any questions related to the use of the equipment or supplies? No  Functional Questionnaire: (I = Independent and D = Dependent) ADLs: I  Bathing/Dressing- I  Meal Prep- I  Eating- I  Maintaining continence- I  Transferring/Ambulation- I  Managing Meds- I  Follow up appointments reviewed:  PCP Hospital f/u appt confirmed? No   Specialist Hospital f/u appt confirmed? No  . Are transportation arrangements needed? No  If their condition worsens, is the pt aware to call PCP or go to the Emergency Dept.? Yes Was the patient provided with contact information for the PCP's office or ED? Yes Was to pt encouraged to call back with questions or concerns? Yes

## 2021-09-07 ENCOUNTER — Other Ambulatory Visit: Payer: Self-pay

## 2021-09-07 ENCOUNTER — Encounter: Payer: Self-pay | Admitting: Family Medicine

## 2021-09-07 ENCOUNTER — Encounter: Payer: Self-pay | Admitting: Emergency Medicine

## 2021-09-07 ENCOUNTER — Ambulatory Visit: Payer: Medicaid Other | Admitting: Family Medicine

## 2021-09-07 VITALS — BP 122/84 | HR 98 | Ht 67.0 in | Wt 243.0 lb

## 2021-09-07 DIAGNOSIS — F32A Depression, unspecified: Secondary | ICD-10-CM

## 2021-09-07 DIAGNOSIS — R7303 Prediabetes: Secondary | ICD-10-CM

## 2021-09-07 DIAGNOSIS — J45909 Unspecified asthma, uncomplicated: Secondary | ICD-10-CM | POA: Diagnosis not present

## 2021-09-07 DIAGNOSIS — F419 Anxiety disorder, unspecified: Secondary | ICD-10-CM

## 2021-09-07 DIAGNOSIS — R519 Headache, unspecified: Secondary | ICD-10-CM | POA: Diagnosis present

## 2021-09-07 DIAGNOSIS — E559 Vitamin D deficiency, unspecified: Secondary | ICD-10-CM

## 2021-09-07 DIAGNOSIS — E7800 Pure hypercholesterolemia, unspecified: Secondary | ICD-10-CM | POA: Diagnosis not present

## 2021-09-07 DIAGNOSIS — J0100 Acute maxillary sinusitis, unspecified: Secondary | ICD-10-CM | POA: Insufficient documentation

## 2021-09-07 DIAGNOSIS — R9431 Abnormal electrocardiogram [ECG] [EKG]: Secondary | ICD-10-CM | POA: Diagnosis not present

## 2021-09-07 LAB — CBC
HCT: 34.5 % — ABNORMAL LOW (ref 36.0–46.0)
Hemoglobin: 11.3 g/dL — ABNORMAL LOW (ref 12.0–15.0)
MCH: 27.4 pg (ref 26.0–34.0)
MCHC: 32.8 g/dL (ref 30.0–36.0)
MCV: 83.7 fL (ref 80.0–100.0)
Platelets: 233 10*3/uL (ref 150–400)
RBC: 4.12 MIL/uL (ref 3.87–5.11)
RDW: 12.8 % (ref 11.5–15.5)
WBC: 5.1 10*3/uL (ref 4.0–10.5)
nRBC: 0 % (ref 0.0–0.2)

## 2021-09-07 LAB — POCT GLYCOSYLATED HEMOGLOBIN (HGB A1C): Hemoglobin A1C: 5.5 % (ref 4.0–5.6)

## 2021-09-07 LAB — BASIC METABOLIC PANEL
Anion gap: 6 (ref 5–15)
BUN: 17 mg/dL (ref 6–20)
CO2: 24 mmol/L (ref 22–32)
Calcium: 8.9 mg/dL (ref 8.9–10.3)
Chloride: 106 mmol/L (ref 98–111)
Creatinine, Ser: 0.74 mg/dL (ref 0.44–1.00)
GFR, Estimated: >60 mL/min (ref >60)
Glucose, Bld: 103 mg/dL — ABNORMAL HIGH (ref 70–99)
Potassium: 3.4 mmol/L — ABNORMAL LOW (ref 3.5–5.1)
Sodium: 136 mmol/L (ref 135–145)

## 2021-09-07 LAB — TROPONIN I (HIGH SENSITIVITY): Troponin I (High Sensitivity): <2 ng/L (ref <18)

## 2021-09-07 NOTE — ED Triage Notes (Signed)
Pt to ED via POV with c/o facial pain. She developed a left sided earache this morning that is now traveling into her top and bottom teeth on the left side of her face. Pt is anxious and tearful in triage.

## 2021-09-07 NOTE — Progress Notes (Signed)
Subjective:    Patient ID: Jasmine Gordon, female    DOB: [birth year], 35 y.o.   MRN: [id number]  Jasmine Gordon is a 35 y.o. female presenting on 09/07/2021 for Prediabetes   HPI   PreDM Morbid Obesity Concern with some wt gain and difficulty wt loss, mostly with central weight. She has plans to restart diet/lifestyle plan. She says work has intervened with her schedule and limited her exercise. A1c today Meds: failed Metformin in past. She was given Trulicity 0.75mg  sample and did not end up using it due to she has pursued lifestyle changes on her own Lifestyle: - Diet & Exercise (improved) Denies hypoglycemia, polyuria, visual changes, numbness or tingling.   HYPERLIPIDEMIA: - Reports no concerns. Last lipid panel 10/2020    Anxiety On Buspar PRN Previous episodes described anxiety panic with sweating, heart racing tachycardia Improved overall Admits more manageable, seems to be right before  menstrual cycle.  Upcoming apt with Dr Cathie Hoops [facility] Hematology Including labs with CBC, Hematology, Vitamin B12  Vitamin D Deficiency She took a 50k vitamin D3 supplement   Depression screen [facility] 2/9 09/07/2021 05/06/2021 11/04/2020  Decreased Interest 0 0 0  Down, Depressed, Hopeless 1 0 0  PHQ - 2 Score 1 0 0  Altered sleeping 1 0 -  Tired, decreased energy 1 1 -  Change in appetite 0 0 -  Feeling bad or failure about yourself  1 1 -  Trouble concentrating 0 0 -  Moving slowly or fidgety/restless 0 0 -  Suicidal thoughts 0 0 -  PHQ-9 Score 4 2 -  Difficult doing work/chores Somewhat difficult Not difficult at all -  Some recent data might be hidden    Social History   Tobacco Use   Smoking status: Never   Smokeless tobacco: Never  Vaping Use   Vaping Use: Never used  Substance Use Topics   Alcohol use: Yes    Alcohol/week: 1.0 standard drink    Types: 1 Glasses of wine per week   Drug use: No    Review of Systems Per HPI unless specifically indicated  above     Objective:    BP 122/84   Pulse 98   Ht 5\' 7"  (1.702 m)   Wt 243 lb (110.2 kg)   SpO2 100%   BMI 38.06 kg/m   Wt Readings from Last 3 Encounters:  09/07/21 243 lb (110.2 kg)  08/17/21 243 lb (110.2 kg)  05/06/21 251 lb 6.4 oz (114 kg)    Physical Exam Vitals and nursing note reviewed.  Constitutional:      General: She is not in acute distress.    Appearance: Normal appearance. She is well-developed. She is not diaphoretic.     Comments: Well-appearing, comfortable, cooperative  HENT:     Head: Normocephalic and atraumatic.  Eyes:     General:        Right eye: No discharge.        Left eye: No discharge.     Conjunctiva/sclera: Conjunctivae normal.  Cardiovascular:     Rate and Rhythm: Normal rate.  Pulmonary:     Effort: Pulmonary effort is normal.  Skin:    General: Skin is warm and dry.     Findings: No erythema or rash.  Neurological:     Mental Status: She is alert and oriented to person, place, and time.  Psychiatric:        Mood and Affect: Mood normal.  Behavior: Behavior normal.        Thought Content: Thought content normal.     Comments: Well groomed, good eye contact, normal speech and thoughts      Results for orders placed or performed in visit on 09/07/21  POCT glycosylated hemoglobin (Hb A1C)  Result Value Ref Range   Hemoglobin A1C 5.5 4.0 - 5.6 %      Assessment & Plan:   Problem List Items Addressed This Visit     Vitamin D deficiency   Relevant Orders   VITAMIN D 25 Hydroxy (Vit-D Deficiency, Fractures)   Pre-diabetes - Primary   Relevant Orders   POCT glycosylated hemoglobin (Hb A1C) (Completed)   Morbid obesity (HCC)   Hyperlipidemia   Relevant Orders   Lipid panel   Anxiety and depression    Vitamin D Deficiency Prior range past few years low 14-18 range She has tried 50k dose some side effect Will check lab.  Hyperlipidemia Last lipid done 10/2020 Elevated LDL 145 Will recheck today with wt loss  lifestyle improvement.  PreDM POC A1c today result 5.5, improved from prior 5.9 to 6.1 range Significantly improved on lifestyle diet exercise  Morbid Obesity BMI >38 with co morbid factors PreDM HLD Encouraged improved wt loss  Anxiety Stable currently chronic problem.  Anemia Followed by Hematology upcoming labs.  No orders of the defined types were placed in this encounter.    Follow up plan: Return in about 6 months (around 03/08/2022) for 6 month follow-up PreDM A1c, Weight, anxiety, anemia updates.   [name], DO [facility] 09/07/2021, 9:35 AM

## 2021-09-07 NOTE — Patient Instructions (Addendum)
Thank you for coming to the office today.  Great job with the A1c 5.5!!!  Check lipid panel today and vitamin D  Upcoming anemia labs Dr Cathie Hoops   Please schedule a Follow-up Appointment to: Return in about 6 months (around 03/08/2022) for 6 month follow-up PreDM A1c, Weight, anxiety, anemia updates.  If you have any other questions or concerns, please feel free to call the office or send a message through MyChart. You may also schedule an earlier appointment if necessary.  Additionally, you may be receiving a survey about your experience at our office within a few days to 1 week by e-mail or mail. We value your feedback.  [name], DO [facility], [place]

## 2021-09-08 ENCOUNTER — Emergency Department
Admission: EM | Admit: 2021-09-08 | Discharge: 2021-09-08 | Disposition: A | Discharge status: Home / Self Care | Payer: Medicaid Other | Attending: Emergency Medicine | Admitting: Emergency Medicine

## 2021-09-08 ENCOUNTER — Encounter: Payer: Self-pay | Admitting: Family Medicine

## 2021-09-08 DIAGNOSIS — J0100 Acute maxillary sinusitis, unspecified: Secondary | ICD-10-CM

## 2021-09-08 LAB — POC URINE PREG, ED: Preg Test, Ur: NEGATIVE

## 2021-09-08 LAB — LIPID PANEL
Cholesterol: 224 mg/dL — ABNORMAL HIGH (ref <200)
HDL: 42 mg/dL — ABNORMAL LOW (ref >=50)
LDL Cholesterol (Calc): 164 mg/dL (calc) — ABNORMAL HIGH
Non-HDL Cholesterol (Calc): 182 mg/dL (calc) — ABNORMAL HIGH (ref <130)
Total CHOL/HDL Ratio: 5.3 (calc) — ABNORMAL HIGH (ref <5.0)
Triglycerides: 77 mg/dL (ref <150)

## 2021-09-08 LAB — VITAMIN D 25 HYDROXY (VIT D DEFICIENCY, FRACTURES): Vit D, 25-Hydroxy: 18 ng/mL — ABNORMAL LOW (ref 30–100)

## 2021-09-08 LAB — TROPONIN I (HIGH SENSITIVITY): Troponin I (High Sensitivity): <2 ng/L (ref <18)

## 2021-09-08 MED ORDER — AMOXICILLIN-POT CLAVULANATE 875-125 MG PO TABS
1.0000 | ORAL_TABLET | Freq: Once | ORAL | Status: AC
  Administered 2021-09-08: 1 via ORAL
  Filled 2021-09-08: qty 1

## 2021-09-08 MED ORDER — AMOXICILLIN-POT CLAVULANATE 875-125 MG PO TABS: 1.0000 | ORAL_TABLET | Freq: Two times a day (BID) | ORAL | 0 refills | Status: AC

## 2021-09-08 MED ORDER — PSEUDOEPHEDRINE HCL 30 MG PO TABS: 30.0000 mg | ORAL_TABLET | Freq: Four times a day (QID) | ORAL | 2 refills | Status: DC | PRN

## 2021-09-08 MED ORDER — IBUPROFEN 600 MG PO TABS
600.0000 mg | ORAL_TABLET | Freq: Once | ORAL | Status: AC
  Administered 2021-09-08: 600 mg via ORAL
  Filled 2021-09-08: qty 1

## 2021-09-08 NOTE — ED Notes (Signed)
Pt evaluated and d/c by Dr Don Perking in triage

## 2021-09-08 NOTE — ED Provider Notes (Signed)
[facility] Emergency Department Provider Note  ____________________________________________  Time seen: Approximately 2:30 AM  I have reviewed the triage vital signs and the nursing notes.   HISTORY  Chief Complaint Facial Pain   HPI Jasmine Gordon is a 35 y.o. female with history of allergies, anemia, anxiety, asthma, obesity, migraine headaches who presents for evaluation of left-sided facial pain.  Patient reports that she was sick last week with cough, congestion, sore throat.  Today she started having sharp pain located in the left cheek radiating to her gum, teeth, and left ear.  She has not taken anything at home for the pain.  No fever, no chest pain, no shortness of breath.  She still has a mild cough that is lingering.   Past Medical History:  Diagnosis Date   Allergy    Anemia    Anxiety    Asthma    Migraine    Obese    Panic attack    PTSD (post-traumatic stress disorder) 2016    Patient Active Problem List   Diagnosis Date Noted   Environmental and seasonal allergies 11/04/2020   Genital warts 09/11/2018   Positive TB test 09/25/2017   Allergic contact dermatitis due to metals 05/17/2017   Pre-diabetes 03/06/2017   Hyperlipidemia 03/06/2017   Allergic reaction 02/08/2017   Urticaria due to food allergy 02/08/2017   Vitamin D deficiency 11/08/2016   Iron deficiency anemia due to chronic blood loss 10/03/2016   Menorrhagia with regular cycle 06/27/2016   Mild persistent asthma without complication 06/17/2016   Anxiety and depression 06/17/2016   Morbid obesity (HCC) 06/17/2016    Past Surgical History:  Procedure Laterality Date   APPENDECTOMY     LEEP  12/2020   CIN II, negative margins   Plantar warts      Prior to Admission medications   Medication Sig Start Date End Date Taking? Authorizing Provider  amoxicillin-clavulanate (AUGMENTIN) 875-125 MG tablet Take 1 tablet by mouth 2 (two) times daily for 7 days.  09/08/21 09/15/21 Yes Kierre Deines, [place], MD  pseudoephedrine (SUDAFED) 30 MG tablet Take 1 tablet (30 mg total) by mouth every 6 (six) hours as needed for congestion. 09/08/21 09/08/22 Yes Trevaris Pennella, [place], MD  albuterol (VENTOLIN HFA) 108 (90 Base) MCG/ACT inhaler Inhale 2 puffs into the lungs every 6 (six) hours as needed for wheezing or shortness of breath. 07/27/21   Karamalegos, Netta Neat, DO  busPIRone (BUSPAR) 5 MG tablet Take 1 tablet (5 mg total) by mouth 2 (two) times daily as needed (anxiety). 08/13/21   Karamalegos, Netta Neat, DO  cetirizine (ZYRTEC ALLERGY) 10 MG tablet Take 1 tablet (10 mg total) by mouth daily. 03/27/21 03/27/22  Lorre Munroe, NP  famotidine (PEPCID) 20 MG tablet Take 1 tablet (20 mg total) by mouth 2 (two) times daily for 14 days. Patient not taking: Reported on 05/31/2021 11/16/20 11/30/20  Chinita Pester, FNP  fluticasone (FLONASE) 50 MCG/ACT nasal spray Place 1 spray into both nostrils daily. Use for 4-6 weeks then stop and use seasonally or as needed. 03/27/21   Lorre Munroe, NP  montelukast (SINGULAIR) 10 MG tablet Take 1 tablet (10 mg total) by mouth at bedtime. 11/04/20   Karamalegos, Netta Neat, DO  nortriptyline (PAMELOR) 10 MG capsule Take 10 mg by mouth at bedtime. 10/10/19   [provider]  omeprazole (PRILOSEC) 40 MG capsule Take 40 mg by mouth daily. 09/26/18   [provider]  ondansetron (ZOFRAN) 4 MG tablet  Take 1 tablet (4 mg total) by mouth every 8 (eight) hours as needed. 09/24/19   Phineas Semen, MD  sertraline (ZOLOFT) 50 MG tablet Take 1 tablet (50 mg total) by mouth daily. 08/13/21   Karamalegos, Netta Neat, DO  triamcinolone cream (KENALOG) 0.5 % Apply 1 application topically 2 (two) times daily. To affected areas, for up to 2 weeks. 03/01/21   Karamalegos, Netta Neat, DO  vitamin B-12 (CYANOCOBALAMIN) 1000 MCG tablet Take 1 tablet (1,000 mcg total) by mouth daily. 07/27/21   Karamalegos, Netta Neat, DO  Vitamin D,  Ergocalciferol, (DRISDOL) 1.25 MG (50000 UNIT) CAPS capsule Take 1 capsule (50,000 Units total) by mouth every 7 (seven) days. 07/27/21   Karamalegos, Netta Neat, DO    Allergies Reglan [metoclopramide]  Family History  Problem Relation Age of Onset   Diabetes Mother    Depression Mother    Mental retardation Mother    Bipolar disorder Mother    Diabetes Father    Stroke Maternal Grandfather    Diabetes Maternal Grandfather     Social History Social History   Tobacco Use   Smoking status: Never   Smokeless tobacco: Never  Vaping Use   Vaping Use: Never used  Substance Use Topics   Alcohol use: Yes    Alcohol/week: 1.0 standard drink    Types: 1 Glasses of wine per week   Drug use: No    Review of Systems  Constitutional: Negative for fever. Eyes: Negative for visual changes. ENT: Negative for sore throat. + L facial pain Neck: No neck pain  Cardiovascular: Negative for chest pain. Respiratory: Negative for shortness of breath. Gastrointestinal: Negative for abdominal pain, vomiting or diarrhea. Genitourinary: Negative for dysuria. Musculoskeletal: Negative for back pain. Skin: Negative for rash. Neurological: Negative for headaches, weakness or numbness. Psych: No SI or HI  ____________________________________________   PHYSICAL EXAM:  VITAL SIGNS: ED Triage Vitals  Enc Vitals Group     BP 09/07/21 2242 (!) 130/97     Pulse Rate 09/07/21 2242 96     Resp 09/07/21 2242 20     Temp 09/07/21 2242 98.6 F (37 C)     Temp Source 09/07/21 2242 Oral     SpO2 09/07/21 2242 99 %     Weight 09/07/21 2244 243 lb (110.2 kg)     Height 09/07/21 2244 5\' 7"  (1.702 m)     Head Circumference --      Peak Flow --      Pain Score 09/07/21 2244 9     Pain Loc --      Pain Edu? --      Excl. in GC? --     Constitutional: Alert and oriented. Well appearing and in no apparent distress. HEENT:      Head: Normocephalic and atraumatic.         Face: Tenderness to  palpation of the maxillary sinus      Ear: TMs visualized and clear      Eyes: Conjunctivae are normal. Sclera is non-icteric.       Mouth/Throat: Mucous membranes are moist.  No trismus, floor of the mouth is soft with no tenderness or induration      Neck: Supple with no signs of meningismus. Cardiovascular: Regular rate and rhythm.  Respiratory: Normal respiratory effort. Lungs are clear to auscultation bilaterally.  Musculoskeletal:  No edema, cyanosis, or erythema of extremities. Neurologic: Normal speech and language. Face is symmetric. Moving all extremities. No gross focal neurologic deficits are  appreciated. Skin: Skin is warm, dry and intact. No rash noted. Psychiatric: Mood and affect are normal. Speech and behavior are normal.  ____________________________________________   LABS (all labs ordered are listed, but only abnormal results are displayed)  Labs Reviewed  BASIC METABOLIC PANEL - Abnormal; Notable for the following components:      Result Value   Potassium 3.4 (*)    Glucose, Bld 103 (*)    All other components within normal limits  CBC - Abnormal; Notable for the following components:   Hemoglobin 11.3 (*)    HCT 34.5 (*)    All other components within normal limits  POC URINE PREG, ED  TROPONIN I (HIGH SENSITIVITY)  TROPONIN I (HIGH SENSITIVITY)   ____________________________________________  EKG  ED ECG REPORT I, Nita Sickle, the attending physician, personally viewed and interpreted this ECG.  Normal sinus rhythm with a rate of 97, normal intervals, normal axis, diffuse T wave flattening with no ST elevations ____________________________________________  RADIOLOGY  none  ____________________________________________   PROCEDURES  Procedure(s) performed: None Procedures   Critical Care performed:  None ____________________________________________   INITIAL IMPRESSION / ASSESSMENT AND PLAN / ED COURSE  35 y.o. female with history  of allergies, anemia, anxiety, asthma, obesity, migraine headaches who presents for evaluation of left-sided facial pain in the setting of a recent upper respiratory infection.  Presentation consistent with acute sinusitis.  We will do sinus rinses, Sudafed, and Augmentin.  Discussed follow-up with primary care doctor and my standard return precautions.  Labs done in triage reviewed by me with no signs of sepsis, normal white count.     _____________________________________________ Please note:  Patient was evaluated in Emergency Department today for the symptoms described in the history of present illness. Patient was evaluated in the context of the global COVID-19 pandemic, which necessitated consideration that the patient might be at risk for infection with the SARS-CoV-2 virus that causes COVID-19. Institutional protocols and algorithms that pertain to the evaluation of patients at risk for COVID-19 are in a state of rapid change based on information released by regulatory bodies including the CDC and federal and state organizations. These policies and algorithms were followed during the patient's care in the ED.  Some ED evaluations and interventions may be delayed as a result of limited staffing during the pandemic.   [place] Controlled Substance Database was reviewed by me. ____________________________________________   FINAL CLINICAL IMPRESSION(S) / ED DIAGNOSES   Final diagnoses:  Acute maxillary sinusitis, recurrence not specified      NEW MEDICATIONS STARTED DURING THIS VISIT:  ED Discharge Orders          Ordered    pseudoephedrine (SUDAFED) 30 MG tablet  Every 6 hours PRN        09/08/21 0229    amoxicillin-clavulanate (AUGMENTIN) 875-125 MG tablet  2 times daily        09/08/21 0229             Note:  This document was prepared using Dragon voice recognition software and may include unintentional dictation errors.    Nita Sickle, MD 09/08/21 [phone]

## 2021-09-09 ENCOUNTER — Telehealth: Payer: Self-pay

## 2021-09-09 NOTE — Telephone Encounter (Signed)
Transition Care Management Follow-up Telephone Call Date of discharge and from where: 09/08/2021 from [facility] How have you been since you were released from the hospital? Pt stated that she is still in pain from the sinus infection. Pt stated that she has started the abx that was rx'ed and has not had any issues yet.  Any questions or concerns? No  Items Reviewed: Did the pt receive and understand the discharge instructions provided? Yes  Medications obtained and verified? Yes  Other? No  Any new allergies since your discharge? No  Dietary orders reviewed? No Do you have support at home? Yes   Functional Questionnaire: (I = Independent and D = Dependent) ADLs: I  Bathing/Dressing- I  Meal Prep- I  Eating- I  Maintaining continence- I  Transferring/Ambulation- I  Managing Meds- I   Follow up appointments reviewed:  PCP Hospital f/u appt confirmed? No  Pt will reach out to PCP office if she does not improve over the weekend.  Specialist Hospital f/u appt confirmed? No   Are transportation arrangements needed? No  If their condition worsens, is the pt aware to call PCP or go to the Emergency Dept.? Yes Was the patient provided with contact information for the PCP's office or ED? Yes Was to pt encouraged to call back with questions or concerns? Yes

## 2021-09-13 ENCOUNTER — Encounter: Payer: Self-pay | Admitting: Family Medicine

## 2021-09-24 ENCOUNTER — Encounter: Payer: Self-pay | Admitting: Family Medicine

## 2021-09-24 DIAGNOSIS — F4310 Post-traumatic stress disorder, unspecified: Secondary | ICD-10-CM

## 2021-09-24 DIAGNOSIS — F410 Panic disorder [episodic paroxysmal anxiety] without agoraphobia: Secondary | ICD-10-CM

## 2021-09-26 ENCOUNTER — Emergency Department
Admission: EM | Admit: 2021-09-26 | Discharge: 2021-09-26 | Disposition: A | Discharge status: Home / Self Care | Payer: Medicaid Other | Attending: Emergency Medicine | Admitting: Emergency Medicine

## 2021-09-26 ENCOUNTER — Other Ambulatory Visit: Payer: Self-pay

## 2021-09-26 DIAGNOSIS — J101 Influenza due to other identified influenza virus with other respiratory manifestations: Secondary | ICD-10-CM | POA: Insufficient documentation

## 2021-09-26 DIAGNOSIS — R9431 Abnormal electrocardiogram [ECG] [EKG]: Secondary | ICD-10-CM | POA: Diagnosis not present

## 2021-09-26 DIAGNOSIS — R0902 Hypoxemia: Secondary | ICD-10-CM | POA: Diagnosis not present

## 2021-09-26 DIAGNOSIS — R1084 Generalized abdominal pain: Secondary | ICD-10-CM | POA: Diagnosis not present

## 2021-09-26 DIAGNOSIS — R000 Tachycardia, unspecified: Secondary | ICD-10-CM | POA: Diagnosis not present

## 2021-09-26 DIAGNOSIS — R509 Fever, unspecified: Secondary | ICD-10-CM | POA: Diagnosis present

## 2021-09-26 DIAGNOSIS — R231 Pallor: Secondary | ICD-10-CM | POA: Diagnosis not present

## 2021-09-26 DIAGNOSIS — Z20822 Contact with and (suspected) exposure to covid-19: Secondary | ICD-10-CM | POA: Insufficient documentation

## 2021-09-26 DIAGNOSIS — Z7951 Long term (current) use of inhaled steroids: Secondary | ICD-10-CM | POA: Insufficient documentation

## 2021-09-26 DIAGNOSIS — J4530 Mild persistent asthma, uncomplicated: Secondary | ICD-10-CM | POA: Diagnosis not present

## 2021-09-26 DIAGNOSIS — R42 Dizziness and giddiness: Secondary | ICD-10-CM | POA: Diagnosis not present

## 2021-09-26 LAB — PREGNANCY, URINE: Preg Test, Ur: NEGATIVE

## 2021-09-26 LAB — RESP PANEL BY RT-PCR (FLU A&B, COVID) ARPGX2
Influenza A by PCR: POSITIVE — AB
Influenza B by PCR: NEGATIVE
SARS Coronavirus 2 by RT PCR: NEGATIVE

## 2021-09-26 LAB — URINALYSIS, ROUTINE W REFLEX MICROSCOPIC
Bacteria, UA: NONE SEEN
Bilirubin Urine: NEGATIVE
Glucose, UA: NEGATIVE mg/dL
Hgb urine dipstick: NEGATIVE
Ketones, ur: 20 mg/dL — AB
Leukocytes,Ua: NEGATIVE
Nitrite: NEGATIVE
Protein, ur: 100 mg/dL — AB
Specific Gravity, Urine: 1.028 (ref 1.005–1.030)
pH: 5 (ref 5.0–8.0)

## 2021-09-26 MED ORDER — ONDANSETRON 4 MG PO TBDP: 4.0000 mg | ORAL_TABLET | Freq: Three times a day (TID) | ORAL | 0 refills | Status: DC | PRN

## 2021-09-26 MED ORDER — ACETAMINOPHEN 325 MG PO TABS
650.0000 mg | ORAL_TABLET | Freq: Once | ORAL | Status: AC
  Administered 2021-09-26: 650 mg via ORAL
  Filled 2021-09-26: qty 2

## 2021-09-26 NOTE — ED Triage Notes (Signed)
Pt reports she has had flu like symptoms over the past couple days. States today she began having dizziness, abdominal pain and increased urinary frequency.

## 2021-09-26 NOTE — ED Provider Notes (Signed)
[facility] Emergency Department Provider Note  ____________________________________________   Event Date/Time   First MD Initiated Contact with Patient 09/26/21 1346     (approximate)  I have reviewed the triage vital signs and the nursing notes.   HISTORY  Chief Complaint Abdominal Pain, Fever, and Urinary Frequency    HPI Jasmine Gordon is a 35 y.o. female anxiety, allergies who comes in with concerns for multiple symptoms.  Patient states that she has a lot of positive flu contacts.  She states that she started having symptoms about 4 to 5 days ago of some mild coughing and little bit of pain in her throat however she was started feeling a lot worse yesterday with some nausea, upper abdominal pain, just not feeling really well and then today she had an episode where she was actually feeling better but felt like she was about to pass out had increased urinary frequency and felt kind of queasy to her abdomen.  She denies any abdominal pain at this time.  Denies any continued urinary symptoms, shortness of breath, chest pain.          Past Medical History:  Diagnosis Date   Allergy    Anemia    Anxiety    Asthma    Migraine    Obese    Panic attack    PTSD (post-traumatic stress disorder) 2016    Patient Active Problem List   Diagnosis Date Noted   Environmental and seasonal allergies 11/04/2020   Genital warts 09/11/2018   Positive TB test 09/25/2017   Allergic contact dermatitis due to metals 05/17/2017   Pre-diabetes 03/06/2017   Hyperlipidemia 03/06/2017   Allergic reaction 02/08/2017   Urticaria due to food allergy 02/08/2017   Vitamin D deficiency 11/08/2016   Iron deficiency anemia due to chronic blood loss 10/03/2016   Menorrhagia with regular cycle 06/27/2016   Mild persistent asthma without complication 06/17/2016   Anxiety and depression 06/17/2016   Morbid obesity (HCC) 06/17/2016    Past Surgical History:   Procedure Laterality Date   APPENDECTOMY     LEEP  12/2020   CIN II, negative margins   Plantar warts      Prior to Admission medications   Medication Sig Start Date End Date Taking? Authorizing Provider  albuterol (VENTOLIN HFA) 108 (90 Base) MCG/ACT inhaler Inhale 2 puffs into the lungs every 6 (six) hours as needed for wheezing or shortness of breath. 07/27/21   Karamalegos, Netta Neat, DO  busPIRone (BUSPAR) 5 MG tablet Take 1 tablet (5 mg total) by mouth 2 (two) times daily as needed (anxiety). 08/13/21   Karamalegos, Netta Neat, DO  cetirizine (ZYRTEC ALLERGY) 10 MG tablet Take 1 tablet (10 mg total) by mouth daily. 03/27/21 03/27/22  Lorre Munroe, NP  famotidine (PEPCID) 20 MG tablet Take 1 tablet (20 mg total) by mouth 2 (two) times daily for 14 days. Patient not taking: Reported on 05/31/2021 11/16/20 11/30/20  Chinita Pester, FNP  fluticasone (FLONASE) 50 MCG/ACT nasal spray Place 1 spray into both nostrils daily. Use for 4-6 weeks then stop and use seasonally or as needed. 03/27/21   Lorre Munroe, NP  montelukast (SINGULAIR) 10 MG tablet Take 1 tablet (10 mg total) by mouth at bedtime. 11/04/20   Karamalegos, Netta Neat, DO  nortriptyline (PAMELOR) 10 MG capsule Take 10 mg by mouth at bedtime. 10/10/19   [provider]  omeprazole (PRILOSEC) 40 MG capsule Take 40 mg by mouth daily.  09/26/18   [provider]  ondansetron (ZOFRAN) 4 MG tablet Take 1 tablet (4 mg total) by mouth every 8 (eight) hours as needed. 09/24/19   Phineas Semen, MD  pseudoephedrine (SUDAFED) 30 MG tablet Take 1 tablet (30 mg total) by mouth every 6 (six) hours as needed for congestion. 09/08/21 09/08/22  Nita Sickle, MD  sertraline (ZOLOFT) 50 MG tablet Take 1 tablet (50 mg total) by mouth daily. 08/13/21   Karamalegos, Netta Neat, DO  triamcinolone cream (KENALOG) 0.5 % Apply 1 application topically 2 (two) times daily. To affected areas, for up to 2 weeks. 03/01/21   Karamalegos,  Netta Neat, DO  vitamin B-12 (CYANOCOBALAMIN) 1000 MCG tablet Take 1 tablet (1,000 mcg total) by mouth daily. 07/27/21   Karamalegos, Netta Neat, DO  Vitamin D, Ergocalciferol, (DRISDOL) 1.25 MG (50000 UNIT) CAPS capsule Take 1 capsule (50,000 Units total) by mouth every 7 (seven) days. 07/27/21   Karamalegos, Netta Neat, DO    Allergies Reglan [metoclopramide]  Family History  Problem Relation Age of Onset   Diabetes Mother    Depression Mother    Mental retardation Mother    Bipolar disorder Mother    Diabetes Father    Stroke Maternal Grandfather    Diabetes Maternal Grandfather     Social History Social History   Tobacco Use   Smoking status: Never   Smokeless tobacco: Never  Vaping Use   Vaping Use: Never used  Substance Use Topics   Alcohol use: Yes    Alcohol/week: 1.0 standard drink    Types: 1 Glasses of wine per week   Drug use: No      Review of Systems Constitutional: Fevers Eyes: No visual changes. ENT: No sore throat. Cardiovascular: Denies chest pain. Respiratory: Denies shortness of breath. Gastrointestinal: No abdominal pain.  No diarrhea.  No constipation.  Queasiness nausea Genitourinary: Negative for dysuria.  Increased urination Musculoskeletal: Negative for back pain. Skin: Negative for rash. Neurological: Negative for headaches, focal weakness or numbness. All other ROS negative ____________________________________________   PHYSICAL EXAM:  VITAL SIGNS: ED Triage Vitals  Enc Vitals Group     BP 09/26/21 1212 125/89     Pulse Rate 09/26/21 1212 (!) 111     Resp 09/26/21 1212 17     Temp 09/26/21 1212 (!) 101.4 F (38.6 C)     Temp Source 09/26/21 1212 Oral     SpO2 09/26/21 1212 98 %     Weight 09/26/21 1218 239 lb (108.4 kg)     Height 09/26/21 1218 5\' 7"  (1.702 m)     Head Circumference --      Peak Flow --      Pain Score 09/26/21 1218 0     Pain Loc --      Pain Edu? --      Excl. in GC? --     Constitutional: Alert and  oriented. Well appearing and in no acute distress. Eyes: Conjunctivae are normal. EOMI. Head: Atraumatic. Nose: No congestion/rhinnorhea. Mouth/Throat: Mucous membranes are moist.   Neck: No stridor. Trachea Midline. FROM Cardiovascular: Normal rate, regular rhythm. Grossly normal heart sounds.  Good peripheral circulation. Respiratory: Normal respiratory effort.  No retractions. Lungs CTAB. Gastrointestinal: Soft and nontender. No distention. No abdominal bruits.  Musculoskeletal: No lower extremity tenderness nor edema.  No joint effusions. Neurologic:  Normal speech and language. No gross focal neurologic deficits are appreciated.  Skin:  Skin is warm, dry and intact. No rash noted. Psychiatric: Mood and  affect are normal. Speech and behavior are normal. GU: Deferred   ____________________________________________   LABS (all labs ordered are listed, but only abnormal results are displayed)  Labs Reviewed  RESP PANEL BY RT-PCR (FLU A&B, COVID) ARPGX2 - Abnormal; Notable for the following components:      Result Value   Influenza A by PCR POSITIVE (*)    All other components within normal limits  URINALYSIS, ROUTINE W REFLEX MICROSCOPIC - Abnormal; Notable for the following components:   Color, Urine AMBER (*)    APPearance CLOUDY (*)    Ketones, ur 20 (*)    Protein, ur 100 (*)    All other components within normal limits  PREGNANCY, URINE  POC URINE PREG, ED   ____________________________________________   ED ECG REPORT I, Concha Se, the attending physician, personally viewed and interpreted this ECG.  Normal sinus, no st elevation, twi lead three  normal intervals. ____________________________________________   INITIAL IMPRESSION / ASSESSMENT AND PLAN / ED COURSE  Jasmine Gordon was evaluated in Emergency Department on 09/26/2021 for the symptoms described in the history of present illness. She was evaluated in the context of the global COVID-19 pandemic,  which necessitated consideration that the patient might be at risk for infection with the SARS-CoV-2 virus that causes COVID-19. Institutional protocols and algorithms that pertain to the evaluation of patients at risk for COVID-19 are in a state of rapid change based on information released by regulatory bodies including the CDC and federal and state organizations. These policies and algorithms were followed during the patient's care in the ED.    Patient comes in with multiple symptoms most concerning for COVID, flu, RSV.  Urine ordered to make sure no evidence of UTI, pregnancy test to make sure no evidence of pregnancy.  Patient's abdomen is soft and nontender.  Patient already had her appendix out.  Still has her gallbladder but denies any upper abdominal pain.  She understands that if she develops pain there that she would need to return the ER.  However suspect that her symptoms are all from her positive flu test.  Her urine is without evidence of UTI.  Pregnancy test was negative.  Given the episode of near syncope we will get an EKG.  Urine does have a little bit of protein, ketones in it.  Patient able to tolerate p.o. hydration and feels comfortable going home with some Zofran and continue to push the fluids.  We discussed Tamiflu and patient is opted to hold off on this.  I discussed the provisional nature of ED diagnosis, the treatment so far, the ongoing plan of care, follow up appointments and return precautions with the patient and any family or support people present. They expressed understanding and agreed with the plan, discharged home.            ____________________________________________   FINAL CLINICAL IMPRESSION(S) / ED DIAGNOSES   Final diagnoses:  Influenza A      MEDICATIONS GIVEN DURING THIS VISIT:  Medications  acetaminophen (TYLENOL) tablet 650 mg (650 mg Oral Given 09/26/21 1230)     ED Discharge Orders          Ordered    ondansetron (ZOFRAN ODT)  4 MG disintegrating tablet  Every 8 hours PRN        09/26/21 1500             Note:  This document was prepared using Dragon voice recognition software and may include unintentional dictation errors.  Concha Se, MD 09/26/21 [phone]

## 2021-09-26 NOTE — Discharge Instructions (Signed)
Take zofran to help with nausea. Tylenol 1g every 6-8 hours and ibuprofen 600mg  with food every 6-8 hours.  Return for SOB or any other concerns.

## 2021-09-26 NOTE — ED Provider Notes (Signed)
Emergency Medicine Provider Triage Evaluation Note  Jasmine Gordon , a 35 y.o. female  was evaluated in triage.  Pt complains of fever, abdominal cramping, urinary frequency.  States she felt woozy headed at home.  Review of Systems  Positive: Fever, abdominal cramping, urinary frequency, exposure to influenza Negative: Chest pain, shortness of breath, abdominal pain  Physical Exam  BP 125/89   Pulse (!) 111   Temp (!) 101.4 F (38.6 C) (Oral)   Resp 17   Ht 5\' 7"  (1.702 m)   Wt 108.4 kg   SpO2 98%   BMI 37.43 kg/m  Gen:   Awake, no distress   Resp:  Normal effort  MSK:   Moves extremities without difficulty  Other:    Medical Decision Making  Medically screening exam initiated at 12:55 PM.  Appropriate orders placed.  Lisaann Devonna Oboyle was informed that the remainder of the evaluation will be completed by another provider, this initial triage assessment does not replace that evaluation, and the importance of remaining in the ED until their evaluation is complete.  Patient looks well.  Most likely has influenza.  Is appropriate for flex area.   [name], PA-C 09/26/21 1257    13/06/22, MD 09/26/21 [phone]

## 2021-09-26 NOTE — ED Notes (Signed)
EKG completed and given to provider for review.

## 2021-09-27 ENCOUNTER — Telehealth: Payer: Self-pay

## 2021-09-27 DIAGNOSIS — F410 Panic disorder [episodic paroxysmal anxiety] without agoraphobia: Secondary | ICD-10-CM | POA: Insufficient documentation

## 2021-09-27 DIAGNOSIS — F411 Generalized anxiety disorder: Secondary | ICD-10-CM | POA: Insufficient documentation

## 2021-09-27 NOTE — Telephone Encounter (Signed)
Transition Care Management Follow-up Telephone Call Date of discharge and from where: 09/26/2021 from [facility] How have you been since you were released from the hospital? Pt stated that she is feeling okay and did not have any questions or concerns at this time.  Any questions or concerns? No  Items Reviewed: Did the pt receive and understand the discharge instructions provided? Yes  Medications obtained and verified? Yes  Other? No  Any new allergies since your discharge? No  Dietary orders reviewed? No Do you have support at home? Yes   Functional Questionnaire: (I = Independent and D = Dependent) ADLs: I  Bathing/Dressing- I  Meal Prep- I  Eating- I  Maintaining continence- I  Transferring/Ambulation- I  Managing Meds- I   Follow up appointments reviewed:  PCP Hospital f/u appt confirmed? No   Specialist Hospital f/u appt confirmed? No   Are transportation arrangements needed? No  If their condition worsens, is the pt aware to call PCP or go to the Emergency Dept.? Yes Was the patient provided with contact information for the PCP's office or ED? Yes Was to pt encouraged to call back with questions or concerns? Yes

## 2021-10-08 ENCOUNTER — Other Ambulatory Visit: Payer: Self-pay

## 2021-10-08 ENCOUNTER — Emergency Department
Admission: EM | Admit: 2021-10-08 | Discharge: 2021-10-08 | Disposition: A | Discharge status: Home / Self Care | Payer: Medicaid Other | Attending: Emergency Medicine | Admitting: Emergency Medicine

## 2021-10-08 ENCOUNTER — Encounter: Payer: Self-pay | Admitting: Emergency Medicine

## 2021-10-08 DIAGNOSIS — Z20822 Contact with and (suspected) exposure to covid-19: Secondary | ICD-10-CM | POA: Insufficient documentation

## 2021-10-08 DIAGNOSIS — H6982 Other specified disorders of Eustachian tube, left ear: Secondary | ICD-10-CM

## 2021-10-08 DIAGNOSIS — H6992 Unspecified Eustachian tube disorder, left ear: Secondary | ICD-10-CM | POA: Insufficient documentation

## 2021-10-08 DIAGNOSIS — J019 Acute sinusitis, unspecified: Secondary | ICD-10-CM | POA: Diagnosis not present

## 2021-10-08 DIAGNOSIS — J45909 Unspecified asthma, uncomplicated: Secondary | ICD-10-CM | POA: Diagnosis not present

## 2021-10-08 LAB — RESP PANEL BY RT-PCR (FLU A&B, COVID) ARPGX2
Influenza A by PCR: POSITIVE — AB
Influenza B by PCR: NEGATIVE
SARS Coronavirus 2 by RT PCR: NEGATIVE

## 2021-10-08 MED ORDER — METHYLPREDNISOLONE 4 MG PO TBPK
ORAL_TABLET | ORAL | 0 refills | Status: DC
Start: 2021-10-08 — End: 2021-12-28

## 2021-10-08 MED ORDER — ACETAMINOPHEN 500 MG PO TABS
1000.0000 mg | ORAL_TABLET | Freq: Once | ORAL | Status: AC
  Administered 2021-10-08: 1000 mg via ORAL
  Filled 2021-10-08: qty 2

## 2021-10-08 MED ORDER — PREDNISONE 20 MG PO TABS
30.0000 mg | ORAL_TABLET | Freq: Once | ORAL | Status: AC
  Administered 2021-10-08: 30 mg via ORAL
  Filled 2021-10-08: qty 1

## 2021-10-08 MED ORDER — AZITHROMYCIN 250 MG PO TABS: ORAL_TABLET | ORAL | 0 refills | Status: DC

## 2021-10-08 NOTE — Discharge Instructions (Signed)
1.  Take Medrol Dosepak as prescribed. 2.  You may start Z-Pak in 3 days time if you are not feeling better. 3.  Return to the ER for worsening symptoms, persistent vomiting, difficulty breathing or other concerns.

## 2021-10-08 NOTE — ED Provider Notes (Signed)
[facility] Emergency Department Provider Note   ____________________________________________   Event Date/Time   First MD Initiated Contact with Patient 10/08/21 [phone]     (approximate)  I have reviewed the triage vital signs and the nursing notes.   HISTORY  Chief Complaint Sinusitis    HPI Jasmine Gordon is a 35 y.o. female who presents to the ED from home with a chief complaint of sinus pain/pressure.  Patient took Augmentin 2 weeks ago for similar symptoms.  Last week she was diagnosed with Influenza A; did not take Tamiflu.  Reports 1 day history of sinus pressure mainly on her left side with radiation into her left ear and jaw.  Unable to sleep secondary to the pain.  Took a nighttime Alka-Seltzer with partial relief of symptoms.  Denies fever, vision changes, headache, neck pain, cough, chest pain, shortness of breath, abdominal pain, nausea or vomiting      Past Medical History:  Diagnosis Date   Allergy    Anemia    Anxiety    Asthma    Migraine    Obese    Panic attack    PTSD (post-traumatic stress disorder) 2016    Patient Active Problem List   Diagnosis Date Noted   Generalized anxiety disorder with panic attacks 09/27/2021   Environmental and seasonal allergies 11/04/2020   Genital warts 09/11/2018   Positive TB test 09/25/2017   Allergic contact dermatitis due to metals 05/17/2017   Pre-diabetes 03/06/2017   Hyperlipidemia 03/06/2017   Allergic reaction 02/08/2017   Urticaria due to food allergy 02/08/2017   Vitamin D deficiency 11/08/2016   Iron deficiency anemia due to chronic blood loss 10/03/2016   Menorrhagia with regular cycle 06/27/2016   Mild persistent asthma without complication 06/17/2016   Anxiety and depression 06/17/2016   Morbid obesity (HCC) 06/17/2016   PTSD (post-traumatic stress disorder) 2016    Past Surgical History:  Procedure Laterality Date   APPENDECTOMY     LEEP  12/2020   CIN II,  negative margins   Plantar warts      Prior to Admission medications   Medication Sig Start Date End Date Taking? Authorizing Provider  azithromycin (ZITHROMAX Z-PAK) 250 MG tablet Take 2 tablets on day #1,  Then 1 tablet daily until finished 10/08/21  Yes Irean Hong, MD  methylPREDNISolone (MEDROL DOSEPAK) 4 MG TBPK tablet Take as directed 10/08/21  Yes Irean Hong, MD  albuterol (VENTOLIN HFA) 108 (90 Base) MCG/ACT inhaler Inhale 2 puffs into the lungs every 6 (six) hours as needed for wheezing or shortness of breath. 07/27/21   Karamalegos, Netta Neat, DO  busPIRone (BUSPAR) 5 MG tablet Take 1 tablet (5 mg total) by mouth 2 (two) times daily as needed (anxiety). 08/13/21   Karamalegos, Netta Neat, DO  cetirizine (ZYRTEC ALLERGY) 10 MG tablet Take 1 tablet (10 mg total) by mouth daily. 03/27/21 03/27/22  Lorre Munroe, NP  famotidine (PEPCID) 20 MG tablet Take 1 tablet (20 mg total) by mouth 2 (two) times daily for 14 days. Patient not taking: Reported on 05/31/2021 11/16/20 11/30/20  Chinita Pester, FNP  fluticasone (FLONASE) 50 MCG/ACT nasal spray Place 1 spray into both nostrils daily. Use for 4-6 weeks then stop and use seasonally or as needed. 03/27/21   Lorre Munroe, NP  montelukast (SINGULAIR) 10 MG tablet Take 1 tablet (10 mg total) by mouth at bedtime. 11/04/20   Karamalegos, Netta Neat, DO  nortriptyline (PAMELOR) 10 MG  capsule Take 10 mg by mouth at bedtime. 10/10/19   [provider]  omeprazole (PRILOSEC) 40 MG capsule Take 40 mg by mouth daily. 09/26/18   [provider]  ondansetron (ZOFRAN ODT) 4 MG disintegrating tablet Take 1 tablet (4 mg total) by mouth every 8 (eight) hours as needed for nausea or vomiting. 09/26/21   Concha Se, MD  ondansetron (ZOFRAN) 4 MG tablet Take 1 tablet (4 mg total) by mouth every 8 (eight) hours as needed. 09/24/19   Phineas Semen, MD  pseudoephedrine (SUDAFED) 30 MG tablet Take 1 tablet (30 mg total) by mouth every 6 (six)  hours as needed for congestion. 09/08/21 09/08/22  Nita Sickle, MD  sertraline (ZOLOFT) 50 MG tablet Take 1 tablet (50 mg total) by mouth daily. 08/13/21   Karamalegos, Netta Neat, DO  triamcinolone cream (KENALOG) 0.5 % Apply 1 application topically 2 (two) times daily. To affected areas, for up to 2 weeks. 03/01/21   Karamalegos, Netta Neat, DO  vitamin B-12 (CYANOCOBALAMIN) 1000 MCG tablet Take 1 tablet (1,000 mcg total) by mouth daily. 07/27/21   Karamalegos, Netta Neat, DO  Vitamin D, Ergocalciferol, (DRISDOL) 1.25 MG (50000 UNIT) CAPS capsule Take 1 capsule (50,000 Units total) by mouth every 7 (seven) days. 07/27/21   Karamalegos, Netta Neat, DO    Allergies Reglan [metoclopramide]  Family History  Problem Relation Age of Onset   Diabetes Mother    Depression Mother    Mental retardation Mother    Bipolar disorder Mother    Diabetes Father    Stroke Maternal Grandfather    Diabetes Maternal Grandfather     Social History Social History   Tobacco Use   Smoking status: Never   Smokeless tobacco: Never  Vaping Use   Vaping Use: Never used  Substance Use Topics   Alcohol use: Yes    Alcohol/week: 1.0 standard drink    Types: 1 Glasses of wine per week   Drug use: No    Review of Systems  Constitutional: No fever/chills Eyes: No visual changes. ENT: Positive for sinus pain/pressure radiating to left ear and jaw.  No sore throat. Cardiovascular: Denies chest pain. Respiratory: Denies shortness of breath. Gastrointestinal: No abdominal pain.  No nausea, no vomiting.  No diarrhea.  No constipation. Genitourinary: Negative for dysuria. Musculoskeletal: Negative for back pain. Skin: Negative for rash. Neurological: Negative for headaches, focal weakness or numbness.   ____________________________________________   PHYSICAL EXAM:  VITAL SIGNS: ED Triage Vitals  Enc Vitals Group     BP 10/08/21 0158 124/84     Pulse Rate 10/08/21 0158 93     Resp 10/08/21  0158 16     Temp 10/08/21 0158 98.2 F (36.8 C)     Temp Source 10/08/21 0158 Oral     SpO2 10/08/21 0158 97 %     Weight 10/08/21 0204 239 lb (108.4 kg)     Height 10/08/21 0204 5\' 7"  (1.702 m)     Head Circumference --      Peak Flow --      Pain Score 10/08/21 0204 10     Pain Loc --      Pain Edu? --      Excl. in GC? --     Constitutional: Alert and oriented. Well appearing and in no acute distress. Eyes: Conjunctivae are normal. PERRL. EOMI. Head: Atraumatic. Ears: Right TM unremarkable.  Left TM not erythematous but bulging with fluid.  No rupture. Nose: No congestion/rhinnorhea. Mouth/Throat: Mucous  membranes are moist.  Oropharynx non-erythematous. Neck: No stridor.  Supple neck without meningismus. Hematological/Lymphatic/Immunilogical: No cervical lymphadenopathy. Cardiovascular: Normal rate, regular rhythm. Grossly normal heart sounds.  Good peripheral circulation. Respiratory: Normal respiratory effort.  No retractions. Lungs CTAB. Gastrointestinal: Soft and nontender. No distention. No abdominal bruits. No CVA tenderness. Musculoskeletal: No lower extremity tenderness nor edema.  No joint effusions. Neurologic:  Normal speech and language. No gross focal neurologic deficits are appreciated. No gait instability. Skin:  Skin is warm, dry and intact. No rash noted.  No petechiae. Psychiatric: Mood and affect are normal. Speech and behavior are normal.  ____________________________________________   LABS (all labs ordered are listed, but only abnormal results are displayed)  Labs Reviewed  RESP PANEL BY RT-PCR (FLU A&B, COVID) ARPGX2 - Abnormal; Notable for the following components:      Result Value   Influenza A by PCR POSITIVE (*)    All other components within normal limits   ____________________________________________  EKG  None ____________________________________________  RADIOLOGY I, Nzinga Ferran J, personally viewed and evaluated these images (plain  radiographs) as part of my medical decision making, as well as reviewing the written report by the radiologist.  ED MD interpretation: None  Official radiology report(s): No results found.  ____________________________________________   PROCEDURES  Procedure(s) performed (including Critical Care):  Procedures   ____________________________________________   INITIAL IMPRESSION / ASSESSMENT AND PLAN / ED COURSE  As part of my medical decision making, I reviewed the following data within the electronic MEDICAL RECORD NUMBER Nursing notes reviewed and incorporated, Labs reviewed, Old chart reviewed, and Notes from prior ED visits     35 year old female presenting with sinus pain/pressure radiating into her left ear and jaw.  Known Influenza A+; out of the window for Tamiflu.  Will place on Medrol Dosepak for eustachian tube dysfunction.  Prescription for Z-Pak given for patient to start in 3 days time if she is not feeling better on just the steroid.  Strict return precautions given.  Patient verbalizes understanding agrees with plan of care.      ____________________________________________   FINAL CLINICAL IMPRESSION(S) / ED DIAGNOSES  Final diagnoses:  Acute sinusitis, recurrence not specified, unspecified location  Eustachian tube dysfunction, left     ED Discharge Orders          Ordered    methylPREDNISolone (MEDROL DOSEPAK) 4 MG TBPK tablet        10/08/21 0505    azithromycin (ZITHROMAX Z-PAK) 250 MG tablet        10/08/21 0505             Note:  This document was prepared using Dragon voice recognition software and may include unintentional dictation errors.    Irean Hong, MD 10/08/21 [phone]

## 2021-10-08 NOTE — ED Triage Notes (Signed)
Pt in via POV, reports severe sinus pressure on left side with radiation into left jaw and ear, being unable to sleep due to pain.  Tearful in triage.  NAD noted at this time.

## 2021-10-10 ENCOUNTER — Encounter: Payer: Self-pay | Admitting: Emergency Medicine

## 2021-10-10 ENCOUNTER — Emergency Department: Payer: Medicaid Other

## 2021-10-10 ENCOUNTER — Emergency Department
Admission: EM | Admit: 2021-10-10 | Discharge: 2021-10-10 | Disposition: A | Discharge status: Home / Self Care | Payer: Medicaid Other | Attending: Student in an Organized Health Care Education/Training Program | Admitting: Student in an Organized Health Care Education/Training Program

## 2021-10-10 ENCOUNTER — Other Ambulatory Visit: Payer: Self-pay

## 2021-10-10 DIAGNOSIS — H9202 Otalgia, left ear: Secondary | ICD-10-CM | POA: Insufficient documentation

## 2021-10-10 DIAGNOSIS — J0100 Acute maxillary sinusitis, unspecified: Secondary | ICD-10-CM | POA: Diagnosis not present

## 2021-10-10 DIAGNOSIS — R519 Headache, unspecified: Secondary | ICD-10-CM | POA: Diagnosis not present

## 2021-10-10 DIAGNOSIS — Z7952 Long term (current) use of systemic steroids: Secondary | ICD-10-CM | POA: Diagnosis not present

## 2021-10-10 DIAGNOSIS — J4530 Mild persistent asthma, uncomplicated: Secondary | ICD-10-CM | POA: Diagnosis not present

## 2021-10-10 DIAGNOSIS — J0101 Acute recurrent maxillary sinusitis: Secondary | ICD-10-CM | POA: Insufficient documentation

## 2021-10-10 LAB — CBC WITH DIFFERENTIAL/PLATELET
Abs Immature Granulocytes: 0.03 10*3/uL (ref 0.00–0.07)
Basophils Absolute: 0 10*3/uL (ref 0.0–0.1)
Basophils Relative: 0 %
Eosinophils Absolute: 0 10*3/uL (ref 0.0–0.5)
Eosinophils Relative: 0 %
HCT: 37.1 % (ref 36.0–46.0)
Hemoglobin: 11.9 g/dL — ABNORMAL LOW (ref 12.0–15.0)
Immature Granulocytes: 0 %
Lymphocytes Relative: 28 %
Lymphs Abs: 2.1 10*3/uL (ref 0.7–4.0)
MCH: 26.8 pg (ref 26.0–34.0)
MCHC: 32.1 g/dL (ref 30.0–36.0)
MCV: 83.6 fL (ref 80.0–100.0)
Monocytes Absolute: 0.4 10*3/uL (ref 0.1–1.0)
Monocytes Relative: 5 %
Neutro Abs: 4.9 10*3/uL (ref 1.7–7.7)
Neutrophils Relative %: 67 %
Platelets: 280 10*3/uL (ref 150–400)
RBC: 4.44 MIL/uL (ref 3.87–5.11)
RDW: 12.8 % (ref 11.5–15.5)
WBC: 7.4 10*3/uL (ref 4.0–10.5)
nRBC: 0 % (ref 0.0–0.2)

## 2021-10-10 LAB — BASIC METABOLIC PANEL
Anion gap: 6 (ref 5–15)
BUN: 11 mg/dL (ref 6–20)
CO2: 27 mmol/L (ref 22–32)
Calcium: 9.5 mg/dL (ref 8.9–10.3)
Chloride: 104 mmol/L (ref 98–111)
Creatinine, Ser: 0.72 mg/dL (ref 0.44–1.00)
GFR, Estimated: >60 mL/min (ref >60)
Glucose, Bld: 103 mg/dL — ABNORMAL HIGH (ref 70–99)
Potassium: 3.7 mmol/L (ref 3.5–5.1)
Sodium: 137 mmol/L (ref 135–145)

## 2021-10-10 LAB — POC URINE PREG, ED: Preg Test, Ur: NEGATIVE

## 2021-10-10 MED ORDER — GUAIFENESIN ER 600 MG PO TB12: 600.0000 mg | ORAL_TABLET | Freq: Two times a day (BID) | ORAL | 0 refills | Status: AC

## 2021-10-10 MED ORDER — OXYMETAZOLINE HCL 0.05 % NA SOLN: 2.0000 | Freq: Two times a day (BID) | NASAL | 0 refills | Status: AC

## 2021-10-10 MED ORDER — LEVOFLOXACIN 750 MG PO TABS: 750.0000 mg | ORAL_TABLET | Freq: Every day | ORAL | 0 refills | Status: AC

## 2021-10-10 MED ORDER — IOHEXOL 300 MG/ML  SOLN
75.0000 mL | Freq: Once | INTRAMUSCULAR | Status: AC | PRN
  Administered 2021-10-10: 75 mL via INTRAVENOUS
  Filled 2021-10-10: qty 75

## 2021-10-10 NOTE — ED Triage Notes (Signed)
Pt reports left earache intermittenlty for several weeks. Pt states was given meds, pain went away and then got the flu and now the pain is back and worse. Pt states she was put on another abx and steroids, seh is on day 2 but the pain is no better.

## 2021-10-10 NOTE — ED Notes (Signed)
Dc ppw provided. Followup and rx information provided. Pt denies any questions at this time. Pt provides verbal consent for DC. Pt off unit with SO on foot,

## 2021-10-10 NOTE — ED Provider Notes (Signed)
Emergency Medicine Provider Triage Evaluation Note  Arlicia Karilyn Wind , a 35 y.o. female  was evaluated in triage.  Pt complains of continued left ear pain and jaw pain.  Pain is sharp and stinging.  Radiates into the left sinus..  Review of Systems  Positive: Sinus pain, left jaw pain, radiation and stinging through the jaw, left ear pain Negative: Chest pain, shortness of breath  Physical Exam  BP 123/81   Pulse (!) 110   Temp 98.8 F (37.1 C) (Oral)   Resp 18   Ht 5\' 7"  (1.702 m)   Wt 109 kg   LMP 10/01/2021 (Approximate)   SpO2 98%   BMI 37.64 kg/m  Gen:   Awake, no distress   Resp:  Normal effort  MSK:   Moves extremities without difficulty  Other:    Medical Decision Making  Medically screening exam initiated at 2:28 PM.  Appropriate orders placed.  Akiba Zakiyah Diop was informed that the remainder of the evaluation will be completed by another provider, this initial triage assessment does not replace that evaluation, and the importance of remaining in the ED until their evaluation is complete.     [name], PA-C 10/10/21 1429    10/12/21, MD 10/10/21 [phone]

## 2021-10-10 NOTE — ED Provider Notes (Signed)
Patient  [facility] Emergency Department Provider Note   ____________________________________________   Event Date/Time   First MD Initiated Contact with Patient 10/10/21 1615     (approximate)  I have reviewed the triage vital signs and the nursing notes.   HISTORY  Chief Complaint Otalgia    HPI Jasmine Gordon is a 35 y.o. female with possible history of hyperlipidemia, asthma, anemia, and anxiety who presents to the ED complaining of facial and ear pain.  Patient reports that she has been dealing with near constant throbbing pain along the left side of her face below her eye and extending back towards her left ear.  Symptoms have been present for the past couple of weeks and patient reports no relief with a course of Augmentin earlier this month.  She was seen for a second time in the ED 2 days ago and prescribed steroid taper again without relief.  She was told to start azithromycin if symptoms had not improved in 3 days, but has not yet had a chance to start steroids.  She describes subjective fevers and chills but has not taken her temperature at home.  She has had greater than 1 week of greenish drainage from both nostrils.  She additionally has been taking over-the-counter pain medication without significant relief.  She denies any dental pain or problems with her vision.        Past Medical History:  Diagnosis Date   Allergy    Anemia    Anxiety    Asthma    Migraine    Obese    Panic attack    PTSD (post-traumatic stress disorder) 2016    Patient Active Problem List   Diagnosis Date Noted   Generalized anxiety disorder with panic attacks 09/27/2021   Environmental and seasonal allergies 11/04/2020   Genital warts 09/11/2018   Positive TB test 09/25/2017   Allergic contact dermatitis due to metals 05/17/2017   Pre-diabetes 03/06/2017   Hyperlipidemia 03/06/2017   Allergic reaction 02/08/2017   Urticaria due to food allergy  02/08/2017   Vitamin D deficiency 11/08/2016   Iron deficiency anemia due to chronic blood loss 10/03/2016   Menorrhagia with regular cycle 06/27/2016   Mild persistent asthma without complication 06/17/2016   Anxiety and depression 06/17/2016   Morbid obesity (HCC) 06/17/2016   PTSD (post-traumatic stress disorder) 2016    Past Surgical History:  Procedure Laterality Date   APPENDECTOMY     LEEP  12/2020   CIN II, negative margins   Plantar warts      Prior to Admission medications   Medication Sig Start Date End Date Taking? Authorizing Provider  guaiFENesin (MUCINEX) 600 MG 12 hr tablet Take 1 tablet (600 mg total) by mouth 2 (two) times daily for 14 days. 10/10/21 10/24/21 Yes Chesley Noon, MD  levofloxacin (LEVAQUIN) 750 MG tablet Take 1 tablet (750 mg total) by mouth daily for 7 days. 10/10/21 10/17/21 Yes Chesley Noon, MD  oxymetazoline (AFRIN) 0.05 % nasal spray Place 2 sprays into both nostrils 2 (two) times daily for 3 days. 10/10/21 10/13/21 Yes Chesley Noon, MD  albuterol (VENTOLIN HFA) 108 (90 Base) MCG/ACT inhaler Inhale 2 puffs into the lungs every 6 (six) hours as needed for wheezing or shortness of breath. 07/27/21   Karamalegos, Netta Neat, DO  azithromycin (ZITHROMAX Z-PAK) 250 MG tablet Take 2 tablets on day #1,  Then 1 tablet daily until finished 10/08/21   Irean Hong, MD  busPIRone (BUSPAR) 5 MG  tablet Take 1 tablet (5 mg total) by mouth 2 (two) times daily as needed (anxiety). 08/13/21   Karamalegos, Netta Neat, DO  cetirizine (ZYRTEC ALLERGY) 10 MG tablet Take 1 tablet (10 mg total) by mouth daily. 03/27/21 03/27/22  Lorre Munroe, NP  famotidine (PEPCID) 20 MG tablet Take 1 tablet (20 mg total) by mouth 2 (two) times daily for 14 days. Patient not taking: Reported on 05/31/2021 11/16/20 11/30/20  Chinita Pester, FNP  fluticasone (FLONASE) 50 MCG/ACT nasal spray Place 1 spray into both nostrils daily. Use for 4-6 weeks then stop and use seasonally or as  needed. 03/27/21   Lorre Munroe, NP  methylPREDNISolone (MEDROL DOSEPAK) 4 MG TBPK tablet Take as directed 10/08/21   Irean Hong, MD  montelukast (SINGULAIR) 10 MG tablet Take 1 tablet (10 mg total) by mouth at bedtime. 11/04/20   Karamalegos, Netta Neat, DO  nortriptyline (PAMELOR) 10 MG capsule Take 10 mg by mouth at bedtime. 10/10/19   [provider]  omeprazole (PRILOSEC) 40 MG capsule Take 40 mg by mouth daily. 09/26/18   [provider]  ondansetron (ZOFRAN ODT) 4 MG disintegrating tablet Take 1 tablet (4 mg total) by mouth every 8 (eight) hours as needed for nausea or vomiting. 09/26/21   Concha Se, MD  ondansetron (ZOFRAN) 4 MG tablet Take 1 tablet (4 mg total) by mouth every 8 (eight) hours as needed. 09/24/19   Phineas Semen, MD  pseudoephedrine (SUDAFED) 30 MG tablet Take 1 tablet (30 mg total) by mouth every 6 (six) hours as needed for congestion. 09/08/21 09/08/22  Nita Sickle, MD  sertraline (ZOLOFT) 50 MG tablet Take 1 tablet (50 mg total) by mouth daily. 08/13/21   Karamalegos, Netta Neat, DO  triamcinolone cream (KENALOG) 0.5 % Apply 1 application topically 2 (two) times daily. To affected areas, for up to 2 weeks. 03/01/21   Karamalegos, Netta Neat, DO  vitamin B-12 (CYANOCOBALAMIN) 1000 MCG tablet Take 1 tablet (1,000 mcg total) by mouth daily. 07/27/21   Karamalegos, Netta Neat, DO  Vitamin D, Ergocalciferol, (DRISDOL) 1.25 MG (50000 UNIT) CAPS capsule Take 1 capsule (50,000 Units total) by mouth every 7 (seven) days. 07/27/21   Karamalegos, Netta Neat, DO    Allergies Reglan [metoclopramide]  Family History  Problem Relation Age of Onset   Diabetes Mother    Depression Mother    Mental retardation Mother    Bipolar disorder Mother    Diabetes Father    Stroke Maternal Grandfather    Diabetes Maternal Grandfather     Social History Social History   Tobacco Use   Smoking status: Never   Smokeless tobacco: Never  Vaping Use   Vaping  Use: Never used  Substance Use Topics   Alcohol use: Yes    Alcohol/week: 1.0 standard drink    Types: 1 Glasses of wine per week   Drug use: No    Review of Systems  Constitutional: No fever/chills Eyes: No visual changes. ENT: No sore throat.  Positive for facial pain and nasal drainage.  Positive for ear pain. Cardiovascular: Denies chest pain. Respiratory: Denies shortness of breath. Gastrointestinal: No abdominal pain.  No nausea, no vomiting.  No diarrhea.  No constipation. Genitourinary: Negative for dysuria. Musculoskeletal: Negative for back pain. Skin: Negative for rash. Neurological: Negative for headaches, focal weakness or numbness.  ____________________________________________   PHYSICAL EXAM:  VITAL SIGNS: ED Triage Vitals  Enc Vitals Group     BP 10/10/21 1426 123/81  Pulse Rate 10/10/21 1426 (!) 110     Resp 10/10/21 1426 18     Temp 10/10/21 1426 98.8 F (37.1 C)     Temp Source 10/10/21 1426 Oral     SpO2 10/10/21 1426 98 %     Weight 10/10/21 1357 240 lb 4.8 oz (109 kg)     Height 10/10/21 1357 5\' 7"  (1.702 m)     Head Circumference --      Peak Flow --      Pain Score 10/10/21 1357 10     Pain Loc --      Pain Edu? --      Excl. in GC? --     Constitutional: Alert and oriented. Eyes: Conjunctivae are normal. Head: Atraumatic.  Tenderness to palpation noted over left maxillary sinus with no erythema or edema noted. Ears: TMs clear bilaterally without erythema or bulging. Nose: No congestion/rhinnorhea. Mouth/Throat: Mucous membranes are moist. Neck: Normal ROM Cardiovascular: Normal rate, regular rhythm. Grossly normal heart sounds. Respiratory: Normal respiratory effort.  No retractions. Lungs CTAB. Gastrointestinal: Soft and nontender. No distention. Genitourinary: deferred Musculoskeletal: No lower extremity tenderness nor edema. Neurologic:  Normal speech and language. No gross focal neurologic deficits are appreciated. Skin:  Skin  is warm, dry and intact. No rash noted. Psychiatric: Mood and affect are normal. Speech and behavior are normal.  ____________________________________________   LABS (all labs ordered are listed, but only abnormal results are displayed)  Labs Reviewed  CBC WITH DIFFERENTIAL/PLATELET - Abnormal; Notable for the following components:      Result Value   Hemoglobin 11.9 (*)    All other components within normal limits  BASIC METABOLIC PANEL - Abnormal; Notable for the following components:   Glucose, Bld 103 (*)    All other components within normal limits  POC URINE PREG, ED    PROCEDURES  Procedure(s) performed (including Critical Care):  Procedures   ____________________________________________   INITIAL IMPRESSION / ASSESSMENT AND PLAN / ED COURSE      35 year old female with past medical history of hyperlipidemia, asthma, anemia, and anxiety who presents to the ED with ongoing facial pain and purulent nasal drainage with subjective fevers.  This is her third visit for similar symptoms and she has not had relief with course of antibiotics.  We will check CT maxillofacial to ensure no developing abscess but patient is overall well-appearing.  No evidence of infectious process noted to ears bilaterally.  CT scan is consistent with maxillary sinusitis but no evidence of abscess or other complication.  Labs are also reassuring.  Patient is appropriate for outpatient management and we will prescribe a course of Levaquin to take instead of azithromycin.  We will also treat symptomatically with Afrin and Mucinex, patient provided with referral to follow-up with ENT.  She was counseled to return to the ED for new worsening symptoms, patient agrees with plan.      ____________________________________________   FINAL CLINICAL IMPRESSION(S) / ED DIAGNOSES  Final diagnoses:  Acute recurrent maxillary sinusitis     ED Discharge Orders          Ordered    levofloxacin  (LEVAQUIN) 750 MG tablet  Daily        10/10/21 1919    oxymetazoline (AFRIN) 0.05 % nasal spray  2 times daily        10/10/21 1919    guaiFENesin (MUCINEX) 600 MG 12 hr tablet  2 times daily        10/10/21 1919  Note:  This document was prepared using Dragon voice recognition software and may include unintentional dictation errors.    Chesley Noon, MD 10/10/21 1921

## 2021-10-11 ENCOUNTER — Encounter: Payer: Self-pay | Admitting: Obstetrics and Gynecology

## 2021-10-11 ENCOUNTER — Telehealth: Payer: Self-pay

## 2021-10-11 ENCOUNTER — Ambulatory Visit: Payer: Medicaid Other | Admitting: Obstetrics and Gynecology

## 2021-10-11 NOTE — Telephone Encounter (Signed)
Transition Care Management Unsuccessful Follow-up Telephone Call  Date of discharge and from where:  10/10/2021 from [facility]  Attempts:  1st Attempt  Reason for unsuccessful TCM follow-up call:  Voice mail full

## 2021-10-12 NOTE — Telephone Encounter (Signed)
Transition Care Management Unsuccessful Follow-up Telephone Call  Date of discharge and from where:  10/10/2021 from [facility]  Attempts:  2nd Attempt  Reason for unsuccessful TCM follow-up call:  Voice mail full

## 2021-10-13 NOTE — Telephone Encounter (Signed)
Transition Care Management Follow-up Telephone Call Date of discharge and from where: 10/10/2021 from [facility] How have you been since you were released from the hospital? Pt stated that she is feeling a lot better and did not have any questions or concerns at this time.  Any questions or concerns? No  Items Reviewed: Did the pt receive and understand the discharge instructions provided? Yes  Medications obtained and verified? Yes  Other? No  Any new allergies since your discharge? No  Dietary orders reviewed? No Do you have support at home? Yes   Functional Questionnaire: (I = Independent and D = Dependent) ADLs: I  Bathing/Dressing- I  Meal Prep- I  Eating- I  Maintaining continence- I  Transferring/Ambulation- I  Managing Meds- I   Follow up appointments reviewed:  PCP Hospital f/u appt confirmed? No  Specialist Hospital f/u appt confirmed? Yes  Scheduled to see Adelene Idler, MD on 10/25/2021 @ 08:10 am. Are transportation arrangements needed? No  If their condition worsens, is the pt aware to call PCP or go to the Emergency Dept.? Yes Was the patient provided with contact information for the PCP's office or ED? Yes Was to pt encouraged to call back with questions or concerns? Yes

## 2021-10-19 DIAGNOSIS — H9202 Otalgia, left ear: Secondary | ICD-10-CM | POA: Diagnosis not present

## 2021-10-19 DIAGNOSIS — J0100 Acute maxillary sinusitis, unspecified: Secondary | ICD-10-CM | POA: Diagnosis not present

## 2021-10-22 ENCOUNTER — Emergency Department: Payer: Medicaid Other

## 2021-10-22 ENCOUNTER — Encounter: Payer: Self-pay | Admitting: Radiology

## 2021-10-22 ENCOUNTER — Other Ambulatory Visit: Payer: Self-pay

## 2021-10-22 DIAGNOSIS — F419 Anxiety disorder, unspecified: Secondary | ICD-10-CM | POA: Insufficient documentation

## 2021-10-22 DIAGNOSIS — R112 Nausea with vomiting, unspecified: Secondary | ICD-10-CM | POA: Diagnosis not present

## 2021-10-22 DIAGNOSIS — J45909 Unspecified asthma, uncomplicated: Secondary | ICD-10-CM | POA: Diagnosis not present

## 2021-10-22 DIAGNOSIS — R002 Palpitations: Secondary | ICD-10-CM | POA: Diagnosis not present

## 2021-10-22 DIAGNOSIS — R079 Chest pain, unspecified: Secondary | ICD-10-CM | POA: Diagnosis not present

## 2021-10-22 LAB — BASIC METABOLIC PANEL
Anion gap: 7 (ref 5–15)
BUN: 11 mg/dL (ref 6–20)
CO2: 25 mmol/L (ref 22–32)
Calcium: 9.6 mg/dL (ref 8.9–10.3)
Chloride: 102 mmol/L (ref 98–111)
Creatinine, Ser: 0.63 mg/dL (ref 0.44–1.00)
GFR, Estimated: >60 mL/min (ref >60)
Glucose, Bld: 100 mg/dL — ABNORMAL HIGH (ref 70–99)
Potassium: 3.4 mmol/L — ABNORMAL LOW (ref 3.5–5.1)
Sodium: 134 mmol/L — ABNORMAL LOW (ref 135–145)

## 2021-10-22 LAB — CBC
HCT: 37.2 % (ref 36.0–46.0)
Hemoglobin: 11.8 g/dL — ABNORMAL LOW (ref 12.0–15.0)
MCH: 26.4 pg (ref 26.0–34.0)
MCHC: 31.7 g/dL (ref 30.0–36.0)
MCV: 83.2 fL (ref 80.0–100.0)
Platelets: 228 10*3/uL (ref 150–400)
RBC: 4.47 MIL/uL (ref 3.87–5.11)
RDW: 13.2 % (ref 11.5–15.5)
WBC: 5.9 10*3/uL (ref 4.0–10.5)
nRBC: 0 % (ref 0.0–0.2)

## 2021-10-22 LAB — TROPONIN I (HIGH SENSITIVITY): Troponin I (High Sensitivity): 3 ng/L (ref <18)

## 2021-10-22 NOTE — ED Triage Notes (Signed)
Palpitations for 3 days with chest pain, palipations, some n/v. Had wisdom tooth removed yesterdays.  Just getting over Flu.  Just wanting to make sure her heat is ok.

## 2021-10-23 ENCOUNTER — Emergency Department
Admission: EM | Admit: 2021-10-23 | Discharge: 2021-10-23 | Disposition: A | Discharge status: Home / Self Care | Payer: Medicaid Other | Attending: Emergency Medicine | Admitting: Emergency Medicine

## 2021-10-23 DIAGNOSIS — F419 Anxiety disorder, unspecified: Secondary | ICD-10-CM

## 2021-10-23 DIAGNOSIS — R002 Palpitations: Secondary | ICD-10-CM

## 2021-10-23 LAB — TROPONIN I (HIGH SENSITIVITY): Troponin I (High Sensitivity): <2 ng/L (ref <18)

## 2021-10-23 MED ORDER — HYDROXYZINE HCL 10 MG PO TABS: 10.0000 mg | ORAL_TABLET | Freq: Three times a day (TID) | ORAL | 0 refills | Status: DC | PRN

## 2021-10-23 NOTE — Discharge Instructions (Signed)
Please seek medical attention for any high fevers, chest pain, shortness of breath, change in behavior, persistent vomiting, bloody stool or any other new or concerning symptoms.  

## 2021-10-23 NOTE — ED Provider Notes (Signed)
[facility] Emergency Department Provider Note   ____________________________________________   I have reviewed the triage vital signs and the nursing notes.   HISTORY  Chief Complaint Palpitations (Palpitations and cp for 3 days.)   History limited by: Not Limited   HPI Jasmine Gordon is a 35 y.o. female who presents to the emergency department today because of concern for palpitations. The patient has been having episodes where she will feel like her heart is racing. It is usually proceeded by being in a hot environment, or she will start feeling hot.  The patient did recently have the flu and also was treated with prednisone.  She is unsure if either of these play a role in these recent episodes of palpitations.  She does have a history of anxiety.  Patient states that 1 of these episodes started before getting a wisdom tooth extracted yesterday.  The patient denies any extremity swelling or recent travel.   Records reviewed. Per medical record review patient has a history of asthma.  Past Medical History:  Diagnosis Date   Allergy    Anemia    Anxiety    Asthma    Migraine    Obese    Panic attack    PTSD (post-traumatic stress disorder) 2016    Patient Active Problem List   Diagnosis Date Noted   Generalized anxiety disorder with panic attacks 09/27/2021   Environmental and seasonal allergies 11/04/2020   Genital warts 09/11/2018   Positive TB test 09/25/2017   Allergic contact dermatitis due to metals 05/17/2017   Pre-diabetes 03/06/2017   Hyperlipidemia 03/06/2017   Allergic reaction 02/08/2017   Urticaria due to food allergy 02/08/2017   Vitamin D deficiency 11/08/2016   Iron deficiency anemia due to chronic blood loss 10/03/2016   Menorrhagia with regular cycle 06/27/2016   Mild persistent asthma without complication 06/17/2016   Anxiety and depression 06/17/2016   Morbid obesity (HCC) 06/17/2016   PTSD (post-traumatic stress  disorder) 2016    Past Surgical History:  Procedure Laterality Date   APPENDECTOMY     LEEP  12/2020   CIN II, negative margins   Plantar warts      Prior to Admission medications   Medication Sig Start Date End Date Taking? Authorizing Provider  albuterol (VENTOLIN HFA) 108 (90 Base) MCG/ACT inhaler Inhale 2 puffs into the lungs every 6 (six) hours as needed for wheezing or shortness of breath. 07/27/21   Karamalegos, Netta Neat, DO  azithromycin (ZITHROMAX Z-PAK) 250 MG tablet Take 2 tablets on day #1,  Then 1 tablet daily until finished 10/08/21   Irean Hong, MD  busPIRone (BUSPAR) 5 MG tablet Take 1 tablet (5 mg total) by mouth 2 (two) times daily as needed (anxiety). 08/13/21   Karamalegos, Netta Neat, DO  cetirizine (ZYRTEC ALLERGY) 10 MG tablet Take 1 tablet (10 mg total) by mouth daily. 03/27/21 03/27/22  Lorre Munroe, NP  famotidine (PEPCID) 20 MG tablet Take 1 tablet (20 mg total) by mouth 2 (two) times daily for 14 days. Patient not taking: Reported on 05/31/2021 11/16/20 11/30/20  Chinita Pester, FNP  fluticasone (FLONASE) 50 MCG/ACT nasal spray Place 1 spray into both nostrils daily. Use for 4-6 weeks then stop and use seasonally or as needed. 03/27/21   Lorre Munroe, NP  guaiFENesin (MUCINEX) 600 MG 12 hr tablet Take 1 tablet (600 mg total) by mouth 2 (two) times daily for 14 days. 10/10/21 10/24/21  Chesley Noon, MD  methylPREDNISolone (MEDROL DOSEPAK) 4 MG TBPK tablet Take as directed 10/08/21   Irean Hong, MD  montelukast (SINGULAIR) 10 MG tablet Take 1 tablet (10 mg total) by mouth at bedtime. 11/04/20   Karamalegos, Netta Neat, DO  nortriptyline (PAMELOR) 10 MG capsule Take 10 mg by mouth at bedtime. 10/10/19   [provider]  omeprazole (PRILOSEC) 40 MG capsule Take 40 mg by mouth daily. 09/26/18   [provider]  ondansetron (ZOFRAN ODT) 4 MG disintegrating tablet Take 1 tablet (4 mg total) by mouth every 8 (eight) hours as needed for nausea or  vomiting. 09/26/21   Concha Se, MD  ondansetron (ZOFRAN) 4 MG tablet Take 1 tablet (4 mg total) by mouth every 8 (eight) hours as needed. 09/24/19   Phineas Semen, MD  pseudoephedrine (SUDAFED) 30 MG tablet Take 1 tablet (30 mg total) by mouth every 6 (six) hours as needed for congestion. 09/08/21 09/08/22  Nita Sickle, MD  sertraline (ZOLOFT) 50 MG tablet Take 1 tablet (50 mg total) by mouth daily. 08/13/21   Karamalegos, Netta Neat, DO  triamcinolone cream (KENALOG) 0.5 % Apply 1 application topically 2 (two) times daily. To affected areas, for up to 2 weeks. 03/01/21   Karamalegos, Netta Neat, DO  vitamin B-12 (CYANOCOBALAMIN) 1000 MCG tablet Take 1 tablet (1,000 mcg total) by mouth daily. 07/27/21   Karamalegos, Netta Neat, DO  Vitamin D, Ergocalciferol, (DRISDOL) 1.25 MG (50000 UNIT) CAPS capsule Take 1 capsule (50,000 Units total) by mouth every 7 (seven) days. 07/27/21   Karamalegos, Netta Neat, DO    Allergies Reglan [metoclopramide]  Family History  Problem Relation Age of Onset   Diabetes Mother    Depression Mother    Mental retardation Mother    Bipolar disorder Mother    Diabetes Father    Stroke Maternal Grandfather    Diabetes Maternal Grandfather     Social History Social History   Tobacco Use   Smoking status: Never   Smokeless tobacco: Never  Vaping Use   Vaping Use: Never used  Substance Use Topics   Alcohol use: Yes    Alcohol/week: 1.0 standard drink    Types: 1 Glasses of wine per week   Drug use: No    Review of Systems Constitutional: No fever/chills Eyes: No visual changes. ENT: No sore throat. Cardiovascular: Positive for palpitations. Respiratory: Denies shortness of breath. Gastrointestinal: No abdominal pain. Nausea.  Genitourinary: Negative for dysuria. Musculoskeletal: Negative for back pain. Skin: Negative for rash. Neurological: Negative for headaches, focal weakness or  numbness.  ____________________________________________   PHYSICAL EXAM:  VITAL SIGNS: ED Triage Vitals  Enc Vitals Group     BP 10/22/21 1955 (!) 132/93     Pulse Rate 10/22/21 1955 98     Resp 10/22/21 1955 18     Temp 10/22/21 1955 98.5 F (36.9 C)     Temp Source 10/22/21 1955 Oral     SpO2 10/22/21 1954 100 %     Weight 10/22/21 1955 234 lb (106.1 kg)     Height 10/22/21 1955 5\' 7"  (1.702 m)     Head Circumference --      Peak Flow --      Pain Score 10/22/21 1955 7    Constitutional: Alert and oriented.  Eyes: Conjunctivae are normal.  ENT      Head: Normocephalic and atraumatic.      Nose: No congestion/rhinnorhea.      Mouth/Throat: Mucous membranes are moist.  Neck: No stridor. Hematological/Lymphatic/Immunilogical: No cervical lymphadenopathy. Cardiovascular: Normal rate, regular rhythm.  No murmurs, rubs, or gallops.  Respiratory: Normal respiratory effort without tachypnea nor retractions. Breath sounds are clear and equal bilaterally. No wheezes/rales/rhonchi. Gastrointestinal: Soft and non tender. No rebound. No guarding.  Genitourinary: Deferred Musculoskeletal: Normal range of motion in all extremities. No lower extremity edema. Neurologic:  Normal speech and language. No gross focal neurologic deficits are appreciated.  Skin:  Skin is warm, dry and intact. No rash noted. Psychiatric: Mood and affect are normal. Speech and behavior are normal. Patient exhibits appropriate insight and judgment.  ____________________________________________    LABS (pertinent positives/negatives)  BMP na 134, k 3.4, glu 100, otherwise wnl CBC wbc 5.9, hgb 11.8, plt 228 Trop hs 3 to <2  ____________________________________________   EKG  I, Phineas Semen, attending physician, personally viewed and interpreted this EKG  EKG Time: 2012 Rate: 90 Rhythm: normal sinus rhythm Axis: normal Intervals: qtc 447 QRS: narrow ST changes: no st  elevation Impression: normal ekg ____________________________________________    RADIOLOGY  CXR No active cardiopulmonary disease  ____________________________________________   PROCEDURES  Procedures  ____________________________________________   INITIAL IMPRESSION / ASSESSMENT AND PLAN / ED COURSE  Pertinent labs & imaging results that were available during my care of the patient were reviewed by me and considered in my medical decision making (see chart for details).   Patient presented to the emergency department today because of concerns for palpitations.  She has had a number of episodes over the past few days.  Patient's work-up here without findings concerning for ACS.  EKG without concerning findings.  Troponin negative x2.  Chest x-ray without any obvious abnormality.  At this time I have low suspicion for PE.  I do think patient could be suffering from panic attacks.  She does state that she has history of anxiety.  Will give patient prescription for Atarax.  Discussed findings and plan with patient.  ____________________________________________   FINAL CLINICAL IMPRESSION(S) / ED DIAGNOSES  Final diagnoses:  Palpitations  Anxiety     Note: This dictation was prepared with Dragon dictation. Any transcriptional errors that result from this process are unintentional     Phineas Semen, MD 10/23/21 [phone]

## 2021-10-25 ENCOUNTER — Ambulatory Visit: Payer: Self-pay | Admitting: *Deleted

## 2021-10-25 ENCOUNTER — Telehealth: Payer: Self-pay

## 2021-10-25 ENCOUNTER — Ambulatory Visit: Payer: Medicaid Other | Admitting: Obstetrics and Gynecology

## 2021-10-25 NOTE — Telephone Encounter (Signed)
Message from [employer] sent at 10/25/2021  3:14 PM EST  Pt called saying she has been having left ear pain for weeks.  She had flu a couple weeks ago and just had her wisdom tooth pulled last week thinking that might be it but she says she is still having pain.  Patient is very upset saying she is tried of having pain .  She ask to see Dr. Kirtland Bouchard but he does not have anything until next week  CB#  [phone]    Attempted to reach the patient. Unable to leave VM. I wanted to offer the patient alternatives including e-visit, MyChart visit or virtual online visit.  Routing to the office after unsuccessful reaching the patient.

## 2021-10-25 NOTE — Telephone Encounter (Signed)
Transition Care Management Unsuccessful Follow-up Telephone Call  Date of discharge and from where:  10/23/2021 from [facility]  Attempts:  1st Attempt  Reason for unsuccessful TCM follow-up call:  Unable to leave message

## 2021-10-25 NOTE — Telephone Encounter (Signed)
2nd attempt to reach pt "Mailbox is full and cannot accept messages." 

## 2021-10-25 NOTE — Telephone Encounter (Signed)
Answer Assessment - Initial Assessment Questions 1. LOCATION: "Which ear is involved?"     Left 2. ONSET: "When did the ear start hurting"      4 weeks ago had flu pain started two weeks later 3. SEVERITY: "How bad is the pain?"  (Scale 1-10; mild, moderate or severe)   - MILD (1-3): doesn't interfere with normal activities    - MODERATE (4-7): interferes with normal activities or awakens from sleep    - SEVERE (8-10): excruciating pain, unable to do any normal activities      7 4. URI SYMPTOMS: "Do you have a runny nose or cough?"     no 5. FEVER: "Do you have a fever?" If Yes, ask: "What is your temperature, how was it measured, and when did it start?"     no 6. CAUSE: "Have you been swimming recently?", "How often do you use Q-TIPS?", "Have you had any recent air travel or scuba diving?"     no 7. OTHER SYMPTOMS: "Do you have any other symptoms?" (e.g., headache, stiff neck, dizziness, vomiting, runny nose, decreased hearing)     Radiating down jaw and up to ear. Tooth was impacted.  Protocols used: Earache-A-AH Pt states she is going back to dentist tomorrow to see if it is related to impacted tooth that was pulled recently. Instructed her to inform him of the pain to see if related. She stated that the tooth beside the one pulled may also be impacted. Pt is to call back if dentist does not feel like the two are related.

## 2021-10-26 ENCOUNTER — Ambulatory Visit: Payer: Self-pay

## 2021-10-26 ENCOUNTER — Encounter: Payer: Self-pay | Admitting: Oncology

## 2021-10-26 NOTE — Telephone Encounter (Signed)
Pt with and left face pain and left jaw pain. Was evaluated by 2 dentist and had a wisdom tooth extracted. Pain persisted and was referred to  ENT.   Pain began a month ago-  initially thought it was a left ear infection and to assess gums and teeth.O ED- abx for OM. One week later flu positive and pain stopped. Finished prednisone  and two 2 days later  aching again. ER referred ENT and evaluation did not reveal cause.  Pain feels like a shock.some comes and goes. Dentist stated she could have trigeminal neuralgia going to see another dentist. Full work up.  Needs appt with Dr Kirtland Bouchard ASAP- VV scheduled for Wednesday. If can be worked in earlier. Care advice given and pt verbalized understanding. Emotional support given throughout call.   Reason for Disposition  Face pain present > 24 hours  Answer Assessment - Initial Assessment Questions 1. ONSET: "When did the pain start?" (e.g., minutes, hours, days)     1 month 2. ONSET: "Does the pain come and go, or has it been constant since it started?" (e.g., constant, intermittent, fleeting)     Comes and goes 3. SEVERITY: "How bad is the pain?"   (Scale 1-10; mild, moderate or severe)   - MILD (1-3): doesn't interfere with normal activities    - MODERATE (4-7): interferes with normal activities or awakens from sleep    - SEVERE (8-10): excruciating pain, unable to do any normal activities      severe 4. LOCATION: "Where does it hurt?"      Left sideno 5. RASH: "Is there any redness, rash, or swelling of the face?"     no 6. FEVER: "Do you have a fever?" If Yes, ask: "What is it, how was it measured, and when did it start?"      no 7. OTHER SYMPTOMS: "Do you have any other symptoms?" (e.g., fever, toothache, nasal discharge, nasal congestion, clicking sensation in jaw joint)     shocking 8. PREGNANCY: "Is there any chance you are pregnant?" "When was your last menstrual period?"     No- LMP last month  Protocols used: Face Pain-A-AH

## 2021-10-26 NOTE — Telephone Encounter (Signed)
Transition Care Management Follow-up Telephone Call Date of discharge and from where: 10/23/2021 from [facility] How have you been since you were released from the hospital? Pt stated that she is still not feeling well and having mouth pain.  Any questions or concerns? No  Items Reviewed: Did the pt receive and understand the discharge instructions provided? Yes  Medications obtained and verified? Yes  Other? No  Any new allergies since your discharge? No  Dietary orders reviewed? No Do you have support at home? Yes   Functional Questionnaire: (I = Independent and D = Dependent) ADLs: I Bathing/Dressing- I Meal Prep- I Eating- I Maintaining continence- I Transferring/Ambulation- I Managing Meds- I  Follow up appointments reviewed: PCP Hospital f/u appt confirmed? No   Specialist Hospital f/u appt confirmed? Yes  Scheduled to see Dentist. Are transportation arrangements needed? No  If their condition worsens, is the pt aware to call PCP or go to the Emergency Dept.? Yes Was the patient provided with contact information for the PCP's office or ED? Yes Was to pt encouraged to call back wit/h questions or concerns? Yes

## 2021-10-27 ENCOUNTER — Emergency Department: Payer: Medicaid Other

## 2021-10-27 ENCOUNTER — Other Ambulatory Visit: Payer: Self-pay

## 2021-10-27 ENCOUNTER — Emergency Department
Admission: EM | Admit: 2021-10-27 | Discharge: 2021-10-27 | Disposition: A | Discharge status: Home / Self Care | Payer: Medicaid Other | Attending: Emergency Medicine | Admitting: Emergency Medicine

## 2021-10-27 DIAGNOSIS — J4530 Mild persistent asthma, uncomplicated: Secondary | ICD-10-CM | POA: Diagnosis not present

## 2021-10-27 DIAGNOSIS — J45909 Unspecified asthma, uncomplicated: Secondary | ICD-10-CM | POA: Diagnosis not present

## 2021-10-27 DIAGNOSIS — R519 Headache, unspecified: Secondary | ICD-10-CM | POA: Diagnosis not present

## 2021-10-27 DIAGNOSIS — Z7951 Long term (current) use of inhaled steroids: Secondary | ICD-10-CM | POA: Diagnosis not present

## 2021-10-27 DIAGNOSIS — G500 Trigeminal neuralgia: Secondary | ICD-10-CM | POA: Insufficient documentation

## 2021-10-27 DIAGNOSIS — R6884 Jaw pain: Secondary | ICD-10-CM | POA: Diagnosis not present

## 2021-10-27 DIAGNOSIS — R42 Dizziness and giddiness: Secondary | ICD-10-CM | POA: Diagnosis present

## 2021-10-27 LAB — CBC
HCT: 35.4 % — ABNORMAL LOW (ref 36.0–46.0)
Hemoglobin: 11.1 g/dL — ABNORMAL LOW (ref 12.0–15.0)
MCH: 26.1 pg (ref 26.0–34.0)
MCHC: 31.4 g/dL (ref 30.0–36.0)
MCV: 83.3 fL (ref 80.0–100.0)
Platelets: 227 10*3/uL (ref 150–400)
RBC: 4.25 MIL/uL (ref 3.87–5.11)
RDW: 13.2 % (ref 11.5–15.5)
WBC: 4.8 10*3/uL (ref 4.0–10.5)
nRBC: 0 % (ref 0.0–0.2)

## 2021-10-27 LAB — BASIC METABOLIC PANEL
Anion gap: 9 (ref 5–15)
BUN: 11 mg/dL (ref 6–20)
CO2: 23 mmol/L (ref 22–32)
Calcium: 9.4 mg/dL (ref 8.9–10.3)
Chloride: 103 mmol/L (ref 98–111)
Creatinine, Ser: 0.85 mg/dL (ref 0.44–1.00)
GFR, Estimated: >60 mL/min (ref >60)
Glucose, Bld: 101 mg/dL — ABNORMAL HIGH (ref 70–99)
Potassium: 3.5 mmol/L (ref 3.5–5.1)
Sodium: 135 mmol/L (ref 135–145)

## 2021-10-27 MED ORDER — OXCARBAZEPINE 300 MG PO TABS: ORAL_TABLET | ORAL | 0 refills | Status: DC

## 2021-10-27 MED ORDER — OXCARBAZEPINE 300 MG PO TABS
300.0000 mg | ORAL_TABLET | ORAL | Status: AC
  Administered 2021-10-27: 300 mg via ORAL
  Filled 2021-10-27: qty 1

## 2021-10-27 NOTE — ED Provider Notes (Signed)
[facility] Emergency Department Provider Note  ____________________________________________  Time seen: Approximately 4:12 PM  I have reviewed the triage vital signs and the nursing notes.   HISTORY  Chief Complaint Dizziness    HPI Jasmine Gordon is a 35 y.o. female with a history of anxiety, asthma, migraine headaches who comes ED complaining of left sided headache and facial pain for the past 2 months.  Intermittent, frequently recurring, no aggravating or alleviating factors.  Denies vision changes, no change in hearing.  No numbness tingling weakness.  No dizziness or syncope.  She is recently seen ENT with reassuring exam, dentistry who removed an impacted molar in her left lower jaw, and followed up with dentistry who noted normal healing process afterward.  She reports the pain helped a little bit with the recent dental extraction but is still having significant pain.  No fevers or chills, no neck pain, no trauma.  Pain feels like a electric stabbing pain in her ear and extending forward to the left side of the face.  She had seen neurology a few years ago for headache syndrome, was prescribed nortriptyline and Maxalt, but she states she is not taking these.  She identifies that those medications were for migraines and this feels different.  Past Medical History:  Diagnosis Date   Allergy    Anemia    Anxiety    Asthma    Migraine    Obese    Panic attack    PTSD (post-traumatic stress disorder) 2016     Patient Active Problem List   Diagnosis Date Noted   Generalized anxiety disorder with panic attacks 09/27/2021   Environmental and seasonal allergies 11/04/2020   Genital warts 09/11/2018   Positive TB test 09/25/2017   Allergic contact dermatitis due to metals 05/17/2017   Pre-diabetes 03/06/2017   Hyperlipidemia 03/06/2017   Allergic reaction 02/08/2017   Urticaria due to food allergy 02/08/2017   Vitamin D deficiency  11/08/2016   Iron deficiency anemia due to chronic blood loss 10/03/2016   Menorrhagia with regular cycle 06/27/2016   Mild persistent asthma without complication 06/17/2016   Anxiety and depression 06/17/2016   Morbid obesity (HCC) 06/17/2016   PTSD (post-traumatic stress disorder) 2016     Past Surgical History:  Procedure Laterality Date   APPENDECTOMY     LEEP  12/2020   CIN II, negative margins   Plantar warts       Prior to Admission medications   Medication Sig Start Date End Date Taking? Authorizing Provider  albuterol (VENTOLIN HFA) 108 (90 Base) MCG/ACT inhaler Inhale 2 puffs into the lungs every 6 (six) hours as needed for wheezing or shortness of breath. 07/27/21   Karamalegos, Netta Neat, DO  azithromycin (ZITHROMAX Z-PAK) 250 MG tablet Take 2 tablets on day #1,  Then 1 tablet daily until finished 10/08/21   Irean Hong, MD  busPIRone (BUSPAR) 5 MG tablet Take 1 tablet (5 mg total) by mouth 2 (two) times daily as needed (anxiety). 08/13/21   Karamalegos, Netta Neat, DO  cetirizine (ZYRTEC ALLERGY) 10 MG tablet Take 1 tablet (10 mg total) by mouth daily. 03/27/21 03/27/22  Lorre Munroe, NP  famotidine (PEPCID) 20 MG tablet Take 1 tablet (20 mg total) by mouth 2 (two) times daily for 14 days. Patient not taking: Reported on 05/31/2021 11/16/20 11/30/20  Chinita Pester, FNP  fluticasone (FLONASE) 50 MCG/ACT nasal spray Place 1 spray into both nostrils daily. Use for 4-6 weeks then  stop and use seasonally or as needed. 03/27/21   Lorre Munroe, NP  hydrOXYzine (ATARAX) 10 MG tablet Take 1 tablet (10 mg total) by mouth 3 (three) times daily as needed for anxiety. 10/23/21   Phineas Semen, MD  methylPREDNISolone (MEDROL DOSEPAK) 4 MG TBPK tablet Take as directed 10/08/21   Irean Hong, MD  montelukast (SINGULAIR) 10 MG tablet Take 1 tablet (10 mg total) by mouth at bedtime. 11/04/20   Karamalegos, Netta Neat, DO  nortriptyline (PAMELOR) 10 MG capsule Take 10 mg by mouth at  bedtime. 10/10/19   [provider]  omeprazole (PRILOSEC) 40 MG capsule Take 40 mg by mouth daily. 09/26/18   [provider]  ondansetron (ZOFRAN ODT) 4 MG disintegrating tablet Take 1 tablet (4 mg total) by mouth every 8 (eight) hours as needed for nausea or vomiting. 09/26/21   Concha Se, MD  ondansetron (ZOFRAN) 4 MG tablet Take 1 tablet (4 mg total) by mouth every 8 (eight) hours as needed. 09/24/19   Phineas Semen, MD  Oxcarbazepine (TRILEPTAL) 300 MG tablet Take 1 tablet (300 mg total) by mouth 2 (two) times daily for 3 days, THEN 1.5 tablets (450 mg total) 2 (two) times daily for 3 days, THEN 2 tablets (600 mg total) 2 (two) times daily for 25 days. 10/27/21 11/27/21  Sharman Cheek, MD  pseudoephedrine (SUDAFED) 30 MG tablet Take 1 tablet (30 mg total) by mouth every 6 (six) hours as needed for congestion. 09/08/21 09/08/22  Nita Sickle, MD  sertraline (ZOLOFT) 50 MG tablet Take 1 tablet (50 mg total) by mouth daily. 08/13/21   Karamalegos, Netta Neat, DO  triamcinolone cream (KENALOG) 0.5 % Apply 1 application topically 2 (two) times daily. To affected areas, for up to 2 weeks. 03/01/21   Karamalegos, Netta Neat, DO  vitamin B-12 (CYANOCOBALAMIN) 1000 MCG tablet Take 1 tablet (1,000 mcg total) by mouth daily. 07/27/21   Karamalegos, Netta Neat, DO  Vitamin D, Ergocalciferol, (DRISDOL) 1.25 MG (50000 UNIT) CAPS capsule Take 1 capsule (50,000 Units total) by mouth every 7 (seven) days. 07/27/21   Karamalegos, Netta Neat, DO     Allergies Reglan [metoclopramide]   Family History  Problem Relation Age of Onset   Diabetes Mother    Depression Mother    Mental retardation Mother    Bipolar disorder Mother    Diabetes Father    Stroke Maternal Grandfather    Diabetes Maternal Grandfather     Social History Social History   Tobacco Use   Smoking status: Never   Smokeless tobacco: Never  Vaping Use   Vaping Use: Never used  Substance Use Topics    Alcohol use: Yes    Alcohol/week: 1.0 standard drink    Types: 1 Glasses of wine per week   Drug use: No    Review of Systems  Constitutional:   No fever or chills.  ENT:   No sore throat. No rhinorrhea.  Left facial pain as above. Cardiovascular:   No chest pain or syncope. Respiratory:   No dyspnea or cough. Gastrointestinal:   Negative for abdominal pain, vomiting and diarrhea.  Musculoskeletal:   Negative for focal pain or swelling All other systems reviewed and are negative except as documented above in ROS and HPI.  ____________________________________________   PHYSICAL EXAM:  VITAL SIGNS: ED Triage Vitals  Enc Vitals Group     BP 10/27/21 1126 (!) 128/92     Pulse Rate 10/27/21 1126 100  Resp 10/27/21 1126 18     Temp 10/27/21 1126 98.8 F (37.1 C)     Temp Source 10/27/21 1126 Oral     SpO2 10/27/21 1126 96 %     Weight --      Height --      Head Circumference --      Peak Flow --      Pain Score 10/27/21 1114 0     Pain Loc --      Pain Edu? --      Excl. in GC? --     Vital signs reviewed, nursing assessments reviewed.   Constitutional:   Alert and oriented. Non-toxic appearance. Eyes:   Conjunctivae are normal. EOMI. PERRL. ENT      Head: Normal.  TMs normal Nose:   Normal      Mouth/Throat:   Moist mucosa.      Neck:   No meningismus. Full ROM. Hematological/Lymphatic/Immunilogical:   No cervical lymphadenopathy. Cardiovascular:   RRR. Symmetric bilateral radial and DP pulses.  No murmurs. Cap refill less than 2 seconds. Respiratory:   Normal respiratory effort without tachypnea/retractions. Breath sounds are clear and equal bilaterally. No wheezes/rales/rhonchi. Gastrointestinal:   Soft and nontender. Non distended. There is no CVA tenderness.  No rebound, rigidity, or guarding. Genitourinary:   deferred Musculoskeletal:   Normal range of motion in all extremities. No joint effusions.  No lower extremity tenderness.  No edema. Neurologic:    Normal speech and language.  Cranial nerves III through XII intact Motor grossly intact. No acute focal neurologic deficits are appreciated.  Skin:    Skin is warm, dry and intact. No rash noted.  No petechiae, purpura, or bullae.  ____________________________________________    LABS (pertinent positives/negatives) (all labs ordered are listed, but only abnormal results are displayed) Labs Reviewed  BASIC METABOLIC PANEL - Abnormal; Notable for the following components:      Result Value   Glucose, Bld 101 (*)    All other components within normal limits  CBC - Abnormal; Notable for the following components:   Hemoglobin 11.1 (*)    HCT 35.4 (*)    All other components within normal limits  URINALYSIS, ROUTINE W REFLEX MICROSCOPIC  CBG MONITORING, ED  POC URINE PREG, ED   ____________________________________________   EKG    ____________________________________________    RADIOLOGY  CT HEAD WO CONTRAST ( )  Result Date: 10/27/2021 CLINICAL DATA:  Left facial and jaw pain EXAM: CT HEAD WITHOUT CONTRAST TECHNIQUE: Contiguous axial images were obtained from the base of the skull through the vertex without intravenous contrast. COMPARISON:  CT maxillofacial 10/10/2021, CT head 12/18/2018 FINDINGS: Brain: There is no acute intracranial hemorrhage, extra-axial fluid collection, or acute infarct. Parenchymal volume is normal. The ventricles are normal in size. There is no mass lesion. There is no midline shift. Vascular: No hyperdense vessel or unexpected calcification. Skull: Normal. Negative for fracture or focal lesion. Sinuses/Orbits: The imaged paranasal sinuses are clear. Other: None. IMPRESSION: Unremarkable head CT. Electronically Signed   By: Lesia Hausen M.D.   On: 10/27/2021 13:35    ____________________________________________   PROCEDURES Procedures  ____________________________________________  DIFFERENTIAL DIAGNOSIS   Trigeminal neuralgia, cluster  headache, migraine headache, intracranial hemorrhage, intracranial mass  CLINICAL IMPRESSION / ASSESSMENT AND PLAN / ED COURSE  Medications ordered in the ED: Medications  Oxcarbazepine (TRILEPTAL) tablet 300 mg (300 mg Oral Given 10/27/21 1547)    Pertinent labs & imaging results that were available during my care of the  patient were reviewed by me and considered in my medical decision making (see chart for details).  Shanena Ciarrah Rae was evaluated in Emergency Department on 10/27/2021 for the symptoms described in the history of present illness. She was evaluated in the context of the global COVID-19 pandemic, which necessitated consideration that the patient might be at risk for infection with the SARS-CoV-2 virus that causes COVID-19. Institutional protocols and algorithms that pertain to the evaluation of patients at risk for COVID-19 are in a state of rapid change based on information released by regulatory bodies including the CDC and federal and state organizations. These policies and algorithms were followed during the patient's care in the ED.   Patient presents with severe left-sided headache for the past 2 months.  No acute neurodeficits.  She is, nontoxic and neurologically intact.  CT scan of the head today is unremarkable.  Vital signs and labs are also normal.  Will try high-dose oxygen therapy for 30 minutes, start on Trileptal for presumed trigeminal neuralgia.  She had an appoint with neurology clinic today but missed it in favor of coming to the emergency department, so I have recommended that she call neurology clinic immediately to reschedule.   Considering the patient's symptoms, medical history, and physical examination today, I have low suspicion for ischemic stroke, intracranial hemorrhage, meningitis, encephalitis, carotid or vertebral dissection, venous sinus thrombosis, MS, intracranial hypertension, glaucoma, CRAO, CRVO, or temporal  arteritis.   ----------------------------------------- 4:27 PM on 10/27/2021 ----------------------------------------- Patient has rescheduled her neurology visit for January 6.  She is feeling better after receiving Trileptal and nonrebreather oxygen.  I will send a prescription for Trileptal to last until she can see neurology.  Stable for discharge.     ____________________________________________   FINAL CLINICAL IMPRESSION(S) / ED DIAGNOSES    Final diagnoses:  Trigeminal neuralgia     ED Discharge Orders          Ordered    Oxcarbazepine (TRILEPTAL) 300 MG tablet  Status:  Discontinued        10/27/21 1612    Oxcarbazepine (TRILEPTAL) 300 MG tablet        10/27/21 1627            Portions of this note were generated with dragon dictation software. Dictation errors may occur despite best attempts at proofreading.    Sharman Cheek, MD 10/27/21 [phone]

## 2021-10-27 NOTE — Telephone Encounter (Signed)
Soonest we can get her in is the time she is scheduled for tomorrow.

## 2021-10-27 NOTE — ED Notes (Signed)
Pt to ED for dizzy episode late morning today. Was on way to see neurologist for scheduled appt but decided to come to ER because was concerned about dizziness. She felt "really hot". Was at work, walking. Did not feel like room was spinning, but she felt off balance.   Late October, had stabbing pain in L ear and L head, then pain spread to whole L face and L head. Had appt scheduled for today with neurologist.  Saw dentist and had emergency extraction for impacted wisdom tooth (6d ago).  Also saw ENT, who looked in ear with scope. At f/u appt with dentist was told that another tooth is fractured (next to where impacted wisdom tooth was). Was told may have trigeminal neuralgia but was told she should have labs drawn and make sure no other causes of pain.  States whole life has been impacted for past 1 month, unable to enjoy kids and husband.

## 2021-10-27 NOTE — Telephone Encounter (Signed)
Pt requesting to have "cancer blood tests done". Please see previous messages and advise.

## 2021-10-27 NOTE — ED Triage Notes (Addendum)
Pt comes with c/o dizziness. Pt states she was at work and this started the patient states she has been dealing with right sided pain in head and face. Pt states she is suppose to have neurologist appt today but feels so sick and wants to be seen.  Pt states she is just worried and doesn't know what is going on.

## 2021-10-28 ENCOUNTER — Encounter: Payer: Self-pay | Admitting: Family Medicine

## 2021-10-28 ENCOUNTER — Telehealth (INDEPENDENT_AMBULATORY_CARE_PROVIDER_SITE_OTHER): Payer: Medicaid Other | Admitting: Family Medicine

## 2021-10-28 VITALS — Wt 234.0 lb

## 2021-10-28 DIAGNOSIS — G500 Trigeminal neuralgia: Secondary | ICD-10-CM | POA: Diagnosis not present

## 2021-10-28 MED ORDER — GABAPENTIN 100 MG PO CAPS
100.0000 mg | ORAL_CAPSULE | Freq: Every evening | ORAL | 1 refills | Status: DC | PRN
Start: 2021-10-28 — End: 2022-03-10

## 2021-10-28 NOTE — Patient Instructions (Addendum)
We can hold off on the Oxcarbazepine for now, due to high risk of side effects and unconfirmed diagnosis.  May trial Gabapentin instead, safer option take 100mg  as needed, or up to max 300mg  per dose usually 1-2 times a day maybe just PM only or PRN.  Keep apt with Neurologist in January  Please schedule a Follow-up Appointment to: Return if symptoms worsen or fail to improve.  If you have any other questions or concerns, please feel free to call the office or send a message through MyChart. You may also schedule an earlier appointment if necessary.  Additionally, you may be receiving a survey about your experience at our office within a few days to 1 week by e-mail or mail. We value your feedback.  , DO [facility], [name]

## 2021-10-28 NOTE — Progress Notes (Signed)
Subjective:    Patient ID: Jasmine Gordon, female    DOB: 08-08-1986, 35 y.o.   MRN: [id number]  Jasmine Gordon is a 35 y.o. female presenting on 10/28/2021 for Facial Pain  Virtual / Telehealth Encounter - Video Visit via MyChart The purpose of this virtual visit is to provide medical care while limiting exposure to the novel coronavirus (COVID19) for both patient and office staff.  Consent was obtained for remote visit:  Yes.   Answered questions that patient had about telehealth interaction:  Yes.   I discussed the limitations, risks, security and privacy concerns of performing an evaluation and management service by video/telephone. I also discussed with the patient that there may be a patient responsible charge related to this service. The patient expressed understanding and agreed to proceed.  Patient Location: Work Provider Location: [facility] (Office)  Participants in virtual visit: - Patient: Jasmine Gordon - CMA: Burnell Blanks, CMA - Provider: Dr Althea Charon   HPI  ED FOLLOW-UP VISIT  Hospital[facility]Location: [facility] Date of ED Visit: 10/27/21  Reason for Presenting to ED: Jaw Facial Pain  FOLLOW-UP  - ED provider note and record have been reviewed - Patient presents today about 1 day after recent ED visit. Brief summary of recent course,   Note issue in November with sinusitis infection that caused radiating ear and sinus pain, thought the ear and jaw pain at that time was due to infection. Treated with antibiotics and prednisone. Sent to ENT they did laryngoscopy and did not identify anything. They thought inflammation was improving  Also went to dentist, they said wisdom tooth impacted. It was extracted.  Seems to be improved overall. Has episodic pain from R ear to jaw.  She went to her regular dentist. And they think tooth in front of   In ED now 10/27/21 they diagnosed her with Trigeminal Neuralgia, and treated with Oxcarbazepine  and oxygen. She did not get much relief, did not start the new rx Oxcarbazepine yet. She was anxious about it for risk of side effect. She has had some improved symptoms overall says it's not too bad.  Has upcoming Neurology apt in January  I have reviewed the discharge medication list, and have reconciled the current and discharge medications today.    Depression screen [facility] 2/9 09/07/2021 05/06/2021 11/04/2020  Decreased Interest 0 0 0  Down, Depressed, Hopeless 1 0 0  PHQ - 2 Score 1 0 0  Altered sleeping 1 0 -  Tired, decreased energy 1 1 -  Change in appetite 0 0 -  Feeling bad or failure about yourself  1 1 -  Trouble concentrating 0 0 -  Moving slowly or fidgety/restless 0 0 -  Suicidal thoughts 0 0 -  PHQ-9 Score 4 2 -  Difficult doing work/chores Somewhat difficult Not difficult at all -  Some recent data might be hidden    Social History   Tobacco Use   Smoking status: Never   Smokeless tobacco: Never  Vaping Use   Vaping Use: Never used  Substance Use Topics   Alcohol use: Yes    Alcohol/week: 1.0 standard drink    Types: 1 Glasses of wine per week   Drug use: No    Review of Systems Per HPI unless specifically indicated above     Objective:    Wt 234 lb (106.1 kg)   LMP 10/01/2021 (Approximate)   BMI 36.65 kg/m   Wt Readings from Last 3 Encounters:  10/28/21 234 lb (106.1 kg)  10/22/21 234 lb (106.1 kg)  10/10/21 240 lb 4.8 oz (109 kg)    Physical Exam  Note examination was completely remotely via video observation objective data only  Gen - well-appearing, no acute distress or apparent pain, comfortable HEENT - eyes appear clear without discharge or redness Heart/Lungs - cannot examine virtually - observed no evidence of coughing or labored breathing. Abd - cannot examine virtually  Skin - face visible today- no rash Neuro - awake, alert, oriented Psych - not anxious appearing   I have personally reviewed the radiology report from 10/10/21  on CT.  CT Maxillofacial W ContrastPerformed 10/10/2021 Final result  Study Result CLINICAL DATA: Left facial and ear pain  EXAM: CT MAXILLOFACIAL WITH CONTRAST  TECHNIQUE: Multidetector CT imaging of the maxillofacial structures was performed with intravenous contrast. Multiplanar CT image reconstructions were also generated.  CONTRAST: 49mL OMNIPAQUE IOHEXOL 300 MG/ML SOLN  COMPARISON: CT head 12/18/2018  FINDINGS: Osseous: There is no acute facial bone fracture. There is no suspicious osseous lesion. The temporomandibular joints are normal.  Orbits: Negative. No traumatic or inflammatory finding.  Sinuses: There is mucosal thickening in the ethmoid air cells and maxillary sinuses with layering fluid in the bilateral maxillary sinuses. The mastoid air cells are clear.  Soft tissues: There is no appreciable abnormal inflammatory change in the soft tissues. There is no abnormal soft tissue lesion or fluid collection.  Limited intracranial: The imaged portions of the intracranial compartment are unremarkable.  IMPRESSION: 1. No abnormal soft tissue inflammation or abscess. 2. Layering fluid in the bilateral maxillary sinuses which can be seen with acute sinusitis in the correct clinical setting.   Electronically Signed By: Lesia Hausen M.D. On: 10/10/2021 18:22  ----------------------------------------------  CT HEAD WO CONTRAST (5MM)Performed 10/27/2021 Final result  Study Result CLINICAL DATA: Left facial and jaw pain  EXAM: CT HEAD WITHOUT CONTRAST  TECHNIQUE: Contiguous axial images were obtained from the base of the skull through the vertex without intravenous contrast.  COMPARISON: CT maxillofacial 10/10/2021, CT head 12/18/2018  FINDINGS: Brain: There is no acute intracranial hemorrhage, extra-axial fluid collection, or acute infarct.  Parenchymal volume is normal. The ventricles are normal in size. There is no mass lesion. There is no midline  shift.  Vascular: No hyperdense vessel or unexpected calcification.  Skull: Normal. Negative for fracture or focal lesion.  Sinuses/Orbits: The imaged paranasal sinuses are clear.  Other: None.  IMPRESSION: Unremarkable head CT.   Electronically Signed By: Lesia Hausen M.D. On: 10/27/2021 13:35     Results for orders placed or performed during the hospital encounter of 10/27/21  Basic metabolic panel  Result Value Ref Range   Sodium 135 135 - 145 mmol/L   Potassium 3.5 3.5 - 5.1 mmol/L   Chloride 103 98 - 111 mmol/L   CO2 23 22 - 32 mmol/L   Glucose, Bld 101 (H) 70 - 99 mg/dL   BUN 11 6 - 20 mg/dL   Creatinine, Ser 9.39 0.44 - 1.00 mg/dL   Calcium 9.4 8.9 - 03.0 mg/dL   GFR, Estimated >09 >23 mL/min   Anion gap 9 5 - 15  CBC  Result Value Ref Range   WBC 4.8 4.0 - 10.5 K/uL   RBC 4.25 3.87 - 5.11 MIL/uL   Hemoglobin 11.1 (L) 12.0 - 15.0 g/dL   HCT 30.0 (L) 76.2 - 26.3 %   MCV 83.3 80.0 - 100.0 fL   MCH 26.1 26.0 - 34.0 pg  MCHC 31.4 30.0 - 36.0 g/dL   RDW 24.2 35.3 - 61.4 %   Platelets 227 150 - 400 K/uL   nRBC 0.0 0.0 - 0.2 %      Assessment & Plan:   Problem List Items Addressed This Visit   None Visit Diagnoses     Trigeminal neuralgia    -  Primary   Relevant Medications   gabapentin (NEURONTIN) 100 MG capsule       Complicated course over past 2+ months with multiple prior diagnoses, uncertain exact etiology Was initial dx with sinusitis, treated per ED/ENT and then has had dx wisdom tooth impaction now s/p extraction by dentist, pain improved but unresolved. There is still a lingering dental issue remaining with other tooth assoc injury.  Now diagnosed with Trigeminal neuralgia, she has known history of headache syndrome in past  Did not take rx oxcarbazepine, however anxious about this med and side effects potentially  She has upcoming apt 11/2021 with [facility] for this  I will change her to Gabapentin as a safer simple alterative  to help curb the pain as needed for now, reviewed dosing.  HOLD off on oxcarbazepine for now  She can f/u with Neurology as planned.  Meds ordered this encounter  Medications   gabapentin (NEURONTIN) 100 MG capsule    Sig: Take 1-3 capsules (100-300 mg total) by mouth at bedtime as needed (nerve pain). May increase dose as needed.    Dispense:  90 capsule    Refill:  1      Follow up plan: Return if symptoms worsen or fail to improve.  Patient verbalizes understanding with the above medical recommendations including the limitation of remote medical advice.  Specific follow-up and call-back criteria were given for patient to follow-up or seek medical care more urgently if needed.  Total duration of direct patient care provided via video conference : 15 minutes   [name], DO [facility] 10/28/2021, 11:29 AM

## 2021-11-04 ENCOUNTER — Emergency Department
Admission: EM | Admit: 2021-11-04 | Discharge: 2021-11-04 | Disposition: A | Discharge status: Home / Self Care | Payer: Medicaid Other | Attending: Emergency Medicine | Admitting: Emergency Medicine

## 2021-11-04 ENCOUNTER — Other Ambulatory Visit: Payer: Self-pay

## 2021-11-04 ENCOUNTER — Encounter: Payer: Self-pay | Admitting: *Deleted

## 2021-11-04 DIAGNOSIS — T23291A Burn of second degree of multiple sites of right wrist and hand, initial encounter: Secondary | ICD-10-CM | POA: Insufficient documentation

## 2021-11-04 DIAGNOSIS — T23091A Burn of unspecified degree of multiple sites of right wrist and hand, initial encounter: Secondary | ICD-10-CM | POA: Diagnosis present

## 2021-11-04 DIAGNOSIS — J4530 Mild persistent asthma, uncomplicated: Secondary | ICD-10-CM | POA: Diagnosis not present

## 2021-11-04 DIAGNOSIS — T23201A Burn of second degree of right hand, unspecified site, initial encounter: Secondary | ICD-10-CM | POA: Diagnosis not present

## 2021-11-04 DIAGNOSIS — Y93G3 Activity, cooking and baking: Secondary | ICD-10-CM | POA: Insufficient documentation

## 2021-11-04 DIAGNOSIS — T310 Burns involving less than 10% of body surface: Secondary | ICD-10-CM | POA: Diagnosis not present

## 2021-11-04 DIAGNOSIS — X102XXA Contact with fats and cooking oils, initial encounter: Secondary | ICD-10-CM | POA: Diagnosis not present

## 2021-11-04 MED ORDER — HYDROCODONE-ACETAMINOPHEN 5-325 MG PO TABS: 1.0000 | ORAL_TABLET | ORAL | 0 refills | Status: DC | PRN

## 2021-11-04 MED ORDER — SILVER SULFADIAZINE 1 % EX CREA
TOPICAL_CREAM | Freq: Once | CUTANEOUS | Status: AC
  Filled 2021-11-04: qty 20

## 2021-11-04 MED ORDER — LIDOCAINE HCL URETHRAL/MUCOSAL 2 % EX GEL
1.0000 "application " | Freq: Once | CUTANEOUS | Status: AC
  Administered 2021-11-04: 1 via TOPICAL
  Filled 2021-11-04: qty 6

## 2021-11-04 MED ORDER — SILVER SULFADIAZINE 1 % EX CREA: TOPICAL_CREAM | CUTANEOUS | 1 refills | Status: DC

## 2021-11-04 MED ORDER — LIDOCAINE 3 % EX CREA: 1.0000 "application " | TOPICAL_CREAM | Freq: Four times a day (QID) | CUTANEOUS | 2 refills | Status: DC | PRN

## 2021-11-04 MED ORDER — HYDROCODONE-ACETAMINOPHEN 5-325 MG PO TABS
1.0000 | ORAL_TABLET | Freq: Once | ORAL | Status: AC
  Administered 2021-11-04: 1 via ORAL
  Filled 2021-11-04: qty 1
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
## 2021-11-05 ENCOUNTER — Telehealth: Payer: Self-pay

## 2021-11-05 NOTE — Telephone Encounter (Signed)
Transition Care Management Follow-up Telephone Call Date of discharge and from where: 11/04/2021-[facility] How have you been since you were released from the hospital? Patient stated she is doing ok. Any questions or concerns? No  Items Reviewed: Did the pt receive and understand the discharge instructions provided? Yes  Medications obtained and verified? Yes  Other? No  Any new allergies since your discharge? No  Dietary orders reviewed? No Do you have support at home? Yes   Home Care and Equipment/Supplies: Were home health services ordered? not applicable If so, what is the name of the agency? N/A  Has the agency set up a time to come to the patient's home? not applicable Were any new equipment or medical supplies ordered?  No What is the name of the medical supply agency? N/A Were you able to get the supplies/equipment? not applicable Do you have any questions related to the use of the equipment or supplies? No  Functional Questionnaire: (I = Independent and D = Dependent) ADLs: I  Bathing/Dressing- I  Meal Prep- I  Eating- I  Maintaining continence- I  Transferring/Ambulation- I  Managing Meds- I  Follow up appointments reviewed:  PCP Hospital f/u appt confirmed? No   Specialist Hospital f/u appt confirmed? No   Are transportation arrangements needed? No  If their condition worsens, is the pt aware to call PCP or go to the Emergency Dept.? Yes Was the patient provided with contact information for the PCP's office or ED? Yes Was to pt encouraged to call back with questions or concerns? Yes

## 2021-11-10 ENCOUNTER — Encounter: Payer: Self-pay | Admitting: Emergency Medicine

## 2021-11-10 ENCOUNTER — Other Ambulatory Visit: Payer: Self-pay

## 2021-11-10 DIAGNOSIS — N9489 Other specified conditions associated with female genital organs and menstrual cycle: Secondary | ICD-10-CM | POA: Insufficient documentation

## 2021-11-10 DIAGNOSIS — E876 Hypokalemia: Secondary | ICD-10-CM | POA: Insufficient documentation

## 2021-11-10 DIAGNOSIS — R1013 Epigastric pain: Secondary | ICD-10-CM | POA: Diagnosis not present

## 2021-11-10 DIAGNOSIS — R0789 Other chest pain: Secondary | ICD-10-CM | POA: Diagnosis not present

## 2021-11-10 DIAGNOSIS — R072 Precordial pain: Secondary | ICD-10-CM | POA: Insufficient documentation

## 2021-11-10 DIAGNOSIS — Z7951 Long term (current) use of inhaled steroids: Secondary | ICD-10-CM | POA: Diagnosis not present

## 2021-11-10 DIAGNOSIS — R079 Chest pain, unspecified: Secondary | ICD-10-CM | POA: Diagnosis not present

## 2021-11-10 DIAGNOSIS — J4530 Mild persistent asthma, uncomplicated: Secondary | ICD-10-CM | POA: Insufficient documentation

## 2021-11-10 LAB — BASIC METABOLIC PANEL
Anion gap: 6 (ref 5–15)
BUN: 10 mg/dL (ref 6–20)
CO2: 23 mmol/L (ref 22–32)
Calcium: 9.3 mg/dL (ref 8.9–10.3)
Chloride: 106 mmol/L (ref 98–111)
Creatinine, Ser: 0.55 mg/dL (ref 0.44–1.00)
GFR, Estimated: >60 mL/min (ref >60)
Glucose, Bld: 95 mg/dL (ref 70–99)
Potassium: 3.2 mmol/L — ABNORMAL LOW (ref 3.5–5.1)
Sodium: 135 mmol/L (ref 135–145)

## 2021-11-10 LAB — CBC
HCT: 33.8 % — ABNORMAL LOW (ref 36.0–46.0)
Hemoglobin: 10.9 g/dL — ABNORMAL LOW (ref 12.0–15.0)
MCH: 26.8 pg (ref 26.0–34.0)
MCHC: 32.2 g/dL (ref 30.0–36.0)
MCV: 83 fL (ref 80.0–100.0)
Platelets: 227 10*3/uL (ref 150–400)
RBC: 4.07 MIL/uL (ref 3.87–5.11)
RDW: 13.5 % (ref 11.5–15.5)
WBC: 5 10*3/uL (ref 4.0–10.5)
nRBC: 0 % (ref 0.0–0.2)

## 2021-11-10 LAB — TROPONIN I (HIGH SENSITIVITY): Troponin I (High Sensitivity): <2 ng/L (ref <18)

## 2021-11-10 NOTE — ED Triage Notes (Signed)
Pt reports that she was at work today and developed epigastric pain. She states that she had a full feeling in her epigastric area and that it was taking her breath. Denies any N/V or diaphoresis.

## 2021-11-11 ENCOUNTER — Emergency Department
Admission: EM | Admit: 2021-11-11 | Discharge: 2021-11-11 | Disposition: A | Discharge status: Home / Self Care | Payer: Medicaid Other | Attending: Emergency Medicine | Admitting: Emergency Medicine

## 2021-11-11 ENCOUNTER — Emergency Department: Payer: Medicaid Other

## 2021-11-11 DIAGNOSIS — R079 Chest pain, unspecified: Secondary | ICD-10-CM | POA: Diagnosis not present

## 2021-11-11 DIAGNOSIS — E876 Hypokalemia: Secondary | ICD-10-CM

## 2021-11-11 LAB — HEPATIC FUNCTION PANEL
ALT: 10 U/L (ref 0–44)
AST: 15 U/L (ref 15–41)
Albumin: 4 g/dL (ref 3.5–5.0)
Alkaline Phosphatase: 43 U/L (ref 38–126)
Bilirubin, Direct: <0.1 mg/dL (ref 0.0–0.2)
Total Bilirubin: 0.7 mg/dL (ref 0.3–1.2)
Total Protein: 7.2 g/dL (ref 6.5–8.1)

## 2021-11-11 LAB — URINALYSIS, ROUTINE W REFLEX MICROSCOPIC
Bilirubin Urine: NEGATIVE
Glucose, UA: NEGATIVE mg/dL
Ketones, ur: 40 mg/dL — AB
Leukocytes,Ua: NEGATIVE
Nitrite: NEGATIVE
Protein, ur: NEGATIVE mg/dL
Specific Gravity, Urine: 1.02 (ref 1.005–1.030)
pH: 5.5 (ref 5.0–8.0)

## 2021-11-11 LAB — POC URINE PREG, ED: Preg Test, Ur: NEGATIVE

## 2021-11-11 LAB — URINALYSIS, MICROSCOPIC (REFLEX)

## 2021-11-11 LAB — TROPONIN I (HIGH SENSITIVITY): Troponin I (High Sensitivity): <2 ng/L (ref <18)

## 2021-11-11 LAB — HCG, QUANTITATIVE, PREGNANCY: hCG, Beta Chain, Quant, S: 1 m[IU]/mL (ref <5)

## 2021-11-11 LAB — LIPASE, BLOOD: Lipase: 25 U/L (ref 11–51)

## 2021-11-11 MED ORDER — POTASSIUM CHLORIDE CRYS ER 20 MEQ PO TBCR
40.0000 meq | EXTENDED_RELEASE_TABLET | Freq: Once | ORAL | Status: AC
  Administered 2021-11-11: 07:00:00 40 meq via ORAL
  Filled 2021-11-11: qty 2

## 2021-11-11 NOTE — ED Provider Notes (Signed)
[facility] Emergency Department Provider Note  ____________________________________________   Event Date/Time   First MD Initiated Contact with Patient 11/11/21 [phone]     (approximate)  I have reviewed the triage vital signs and the nursing notes.   HISTORY  Chief Complaint Chest Pain   HPI Jasmine Gordon is a 35 y.o. female with below noted past medical history who presents for assessment of some lower substernal chest pain and epigastric discomfort that she has had almost took her breath away yesterday while at work.  She states she is currently chest pain-free.  Seem to come and go but she is now symptom-free.  She did not have any associated diaphoresis, nausea, vomiting, diarrhea, lower abdominal pain, urinary symptoms, cough, fever, headache, earache, sore throat or any other clear sick symptoms.  She denies tobacco abuse, EtOH use or illicit drug use.  She notes she did burn her right hand a couple days ago but had any other burns or injuries or falls and burn seems to be healing appropriately per patient.  She denies any significant EtOH or NSAIDs.  No other acute concerns at this time.  Denies any hormonal birth control or history of DVT/PE.         Past Medical History:  Diagnosis Date   Allergy    Anemia    Anxiety    Asthma    Migraine    Obese    Panic attack    PTSD (post-traumatic stress disorder) 2016    Patient Active Problem List   Diagnosis Date Noted   Generalized anxiety disorder with panic attacks 09/27/2021   Environmental and seasonal allergies 11/04/2020   Genital warts 09/11/2018   Positive TB test 09/25/2017   Allergic contact dermatitis due to metals 05/17/2017   Pre-diabetes 03/06/2017   Hyperlipidemia 03/06/2017   Allergic reaction 02/08/2017   Urticaria due to food allergy 02/08/2017   Vitamin D deficiency 11/08/2016   Iron deficiency anemia due to chronic blood loss 10/03/2016   Menorrhagia with regular  cycle 06/27/2016   Mild persistent asthma without complication 06/17/2016   Anxiety and depression 06/17/2016   Morbid obesity (HCC) 06/17/2016   PTSD (post-traumatic stress disorder) 2016    Past Surgical History:  Procedure Laterality Date   APPENDECTOMY     LEEP  12/2020   CIN II, negative margins   Plantar warts      Prior to Admission medications   Medication Sig Start Date End Date Taking? Authorizing Provider  albuterol (VENTOLIN HFA) 108 (90 Base) MCG/ACT inhaler Inhale 2 puffs into the lungs every 6 (six) hours as needed for wheezing or shortness of breath. 07/27/21   Karamalegos, Netta Neat, DO  busPIRone (BUSPAR) 5 MG tablet Take 1 tablet (5 mg total) by mouth 2 (two) times daily as needed (anxiety). 08/13/21   Karamalegos, Netta Neat, DO  cetirizine (ZYRTEC ALLERGY) 10 MG tablet Take 1 tablet (10 mg total) by mouth daily. 03/27/21 03/27/22  Lorre Munroe, NP  famotidine (PEPCID) 20 MG tablet Take 1 tablet (20 mg total) by mouth 2 (two) times daily for 14 days. Patient not taking: Reported on 05/31/2021 11/16/20 11/30/20  Chinita Pester, FNP  fluticasone (FLONASE) 50 MCG/ACT nasal spray Place 1 spray into both nostrils daily. Use for 4-6 weeks then stop and use seasonally or as needed. 03/27/21   Lorre Munroe, NP  gabapentin (NEURONTIN) 100 MG capsule Take 1-3 capsules (100-300 mg total) by mouth at bedtime as needed (nerve  pain). May increase dose as needed. 10/28/21   Karamalegos, Netta Neat, DO  HYDROcodone-acetaminophen (NORCO/VICODIN) 5-325 MG tablet Take 1 tablet by mouth every 4 (four) hours as needed for moderate pain. 11/04/21 11/04/22  Cuthriell, Delorise Royals, PA-C  hydrOXYzine (ATARAX) 10 MG tablet Take 1 tablet (10 mg total) by mouth 3 (three) times daily as needed for anxiety. 10/23/21   Phineas Semen, MD  Lidocaine 3 % CREA Apply 1 application topically 4 (four) times daily as needed (pain). 11/04/21   Cuthriell, Delorise Royals, PA-C  methylPREDNISolone (MEDROL DOSEPAK)  4 MG TBPK tablet Take as directed 10/08/21   Irean Hong, MD  montelukast (SINGULAIR) 10 MG tablet Take 1 tablet (10 mg total) by mouth at bedtime. 11/04/20   Karamalegos, Netta Neat, DO  nortriptyline (PAMELOR) 10 MG capsule Take 10 mg by mouth at bedtime. 10/10/19   [provider]  omeprazole (PRILOSEC) 40 MG capsule Take 40 mg by mouth daily. 09/26/18   [provider]  ondansetron (ZOFRAN ODT) 4 MG disintegrating tablet Take 1 tablet (4 mg total) by mouth every 8 (eight) hours as needed for nausea or vomiting. 09/26/21   Concha Se, MD  ondansetron (ZOFRAN) 4 MG tablet Take 1 tablet (4 mg total) by mouth every 8 (eight) hours as needed. 09/24/19   Phineas Semen, MD  pseudoephedrine (SUDAFED) 30 MG tablet Take 1 tablet (30 mg total) by mouth every 6 (six) hours as needed for congestion. 09/08/21 09/08/22  Nita Sickle, MD  sertraline (ZOLOFT) 50 MG tablet Take 1 tablet (50 mg total) by mouth daily. 08/13/21   Karamalegos, Netta Neat, DO  silver sulfADIAZINE (SILVADENE) 1 % cream Apply to affected area daily 11/04/21 11/04/22  Cuthriell, Delorise Royals, PA-C  triamcinolone cream (KENALOG) 0.5 % Apply 1 application topically 2 (two) times daily. To affected areas, for up to 2 weeks. 03/01/21   Karamalegos, Netta Neat, DO  vitamin B-12 (CYANOCOBALAMIN) 1000 MCG tablet Take 1 tablet (1,000 mcg total) by mouth daily. 07/27/21   Karamalegos, Netta Neat, DO  Vitamin D, Ergocalciferol, (DRISDOL) 1.25 MG (50000 UNIT) CAPS capsule Take 1 capsule (50,000 Units total) by mouth every 7 (seven) days. 07/27/21   Karamalegos, Netta Neat, DO    Allergies Reglan [metoclopramide]  Family History  Problem Relation Age of Onset   Diabetes Mother    Depression Mother    Mental retardation Mother    Bipolar disorder Mother    Diabetes Father    Stroke Maternal Grandfather    Diabetes Maternal Grandfather     Social History Social History   Tobacco Use   Smoking status: Never    Smokeless tobacco: Never  Vaping Use   Vaping Use: Never used  Substance Use Topics   Alcohol use: Not Currently    Alcohol/week: 1.0 standard drink    Types: 1 Glasses of wine per week   Drug use: No    Review of Systems  Review of Systems  Constitutional:  Negative for chills and fever.  HENT:  Negative for sore throat.   Eyes:  Negative for pain.  Respiratory:  Negative for cough and stridor.   Cardiovascular:  Positive for chest pain (substernal, lower).  Gastrointestinal:  Positive for abdominal pain (epigastric). Negative for vomiting.  Genitourinary:  Negative for dysuria.  Musculoskeletal:  Negative for myalgias.  Skin:  Negative for rash.  Neurological:  Negative for seizures, loss of consciousness and headaches.  Psychiatric/Behavioral:  Negative for suicidal ideas.   All other systems reviewed  and are negative.    ____________________________________________   PHYSICAL EXAM:  VITAL SIGNS: ED Triage Vitals  Enc Vitals Group     BP 11/10/21 2105 127/75     Pulse Rate 11/10/21 2105 86     Resp 11/10/21 2105 20     Temp 11/10/21 2105 98.6 F (37 C)     Temp Source 11/10/21 2105 Oral     SpO2 11/10/21 2105 97 %     Weight 11/10/21 2106 236 lb (107 kg)     Height 11/10/21 2106 5\' 7"  (1.702 m)     Head Circumference --      Peak Flow --      Pain Score 11/10/21 2106 7     Pain Loc --      Pain Edu? --      Excl. in GC? --    Vitals:   11/10/21 2356 11/11/21 0447  BP: 121/69 120/78  Pulse: 88 90  Resp: 19 19  Temp:  98.5 F (36.9 C)  SpO2: 100% 97%   Physical Exam Vitals and nursing note reviewed.  Constitutional:      General: She is not in acute distress.    Appearance: She is well-developed. She is obese.  HENT:     Head: Normocephalic and atraumatic.     Right Ear: External ear normal.     Left Ear: External ear normal.     Nose: Nose normal.  Eyes:     Conjunctiva/sclera: Conjunctivae normal.  Cardiovascular:     Rate and Rhythm: Normal  rate and regular rhythm.     Heart sounds: No murmur heard. Pulmonary:     Effort: Pulmonary effort is normal. No respiratory distress.     Breath sounds: Normal breath sounds.  Abdominal:     Palpations: Abdomen is soft.     Tenderness: There is no abdominal tenderness.  Musculoskeletal:        General: No swelling.     Cervical back: Neck supple.  Skin:    General: Skin is warm and dry.     Capillary Refill: Capillary refill takes less than 2 seconds.  Neurological:     Mental Status: She is alert and oriented to person, place, and time.  Psychiatric:        Mood and Affect: Mood normal.    No tenderness over the sternum or epigastric area.  Abdomen soft throughout. ____________________________________________   LABS (all labs ordered are listed, but only abnormal results are displayed)  Labs Reviewed  BASIC METABOLIC PANEL - Abnormal; Notable for the following components:      Result Value   Potassium 3.2 (*)    All other components within normal limits  CBC - Abnormal; Notable for the following components:   Hemoglobin 10.9 (*)    HCT 33.8 (*)    All other components within normal limits  URINALYSIS, ROUTINE W REFLEX MICROSCOPIC - Abnormal; Notable for the following components:   Hgb urine dipstick TRACE (*)    Ketones, ur 40 (*)    All other components within normal limits  URINALYSIS, MICROSCOPIC (REFLEX) - Abnormal; Notable for the following components:   Bacteria, UA RARE (*)    All other components within normal limits  HCG, QUANTITATIVE, PREGNANCY  HEPATIC FUNCTION PANEL  LIPASE, BLOOD  POC URINE PREG, ED  TROPONIN I (HIGH SENSITIVITY)  TROPONIN I (HIGH SENSITIVITY)   ____________________________________________  EKG  ECG remarkable sinus rhythm with a ventricular rate of 97 and sinus arrhythmia with  nonspecific change in lead III and V3 without other clear evidence of acute ischemia or significant  arrhythmia. ____________________________________________  RADIOLOGY  ED MD interpretation: Chest x-ray shows no focal consolidation, effusion, edema, pneumothorax or any other clear acute thoracic process.  Official radiology report(s): DG Chest 2 View  Result Date: 11/11/2021 CLINICAL DATA:  Chest pain EXAM: CHEST - 2 VIEW COMPARISON:  10/22/2021 FINDINGS: The heart size and mediastinal contours are within normal limits. Both lungs are clear. The visualized skeletal structures are unremarkable. IMPRESSION: No active cardiopulmonary disease. Electronically Signed   By: Helyn Numbers M.D.   On: 11/11/2021 02:44    ____________________________________________   PROCEDURES  Procedure(s) performed (including Critical Care):  Procedures   ____________________________________________   INITIAL IMPRESSION / ASSESSMENT AND PLAN / ED COURSE      Patient presents with above-stated history exam for assessment of some substernal and epigastric discomfort that seems to have resolved prior to arrival.  It started yesterday at work.  On arrival she is afebrile and hemodynamically stable.  Her lungs are clear bilaterally and no not able to reproduce her pain on exam in the lower sternum or epigastrium.  Overall I have low suspicion for PE as patient is PERC negative and symptoms is resolved.  Additional differential considerations include ACS, pneumonia, pneumothorax, bronchitis, esophageal spasm, GERD, gastritis, pancreatitis, and cholecystitis.  ECG remarkable sinus rhythm with a ventricular rate of 97 and sinus arrhythmia with nonspecific change in lead III and V3 without other clear evidence of acute ischemia or significant arrhythmia.  Given nonelevated troponin x2 Evalose suspicion for ACS or myocarditis.  Pregnancy test is negative.  Lipase not consistent with acute pancreatitis.  No tenderness in right upper quadrant findings of hepatitis or cholestatic process on hepatic function panel to  suggest acute cholecystitis at this time.  CBC shows no leukocytosis and some stable chronic anemia.  Hemoglobin of 10.9 as compared to 11.12 weeks ago.  BMP remarkable for K of 3.2 without any other significant electrolyte or metabolic derangements  Chest x-ray shows no focal consolidation, effusion, edema, pneumothorax or any other clear acute thoracic process.  Unclear primary cause for patient symptoms although given she is now asymptomatic with stable vitals and reassuring exam and work-up and low suspicion for immediate life-threatening process I think she is stable for discharge with outpatient follow-up.  Discharged in stable condition.  Strict return precautions advised and discussed.      ____________________________________________   FINAL CLINICAL IMPRESSION(S) / ED DIAGNOSES  Final diagnoses:  Chest pain, unspecified type  Hypokalemia    Medications  potassium chloride SA (KLOR-CON M) CR tablet 40 mEq (has no administration in time range)     ED Discharge Orders     None        Note:  This document was prepared using Dragon voice recognition software and may include unintentional dictation errors.    Gilles Chiquito, MD 11/11/21 [phone]

## 2021-11-11 NOTE — ED Notes (Signed)
Pt evaluated by provider in triage

## 2021-11-12 ENCOUNTER — Telehealth: Payer: Self-pay

## 2021-11-12 NOTE — Telephone Encounter (Signed)
Transition Care Management Follow-up Telephone Call Date of discharge and from where: 11/11/2021-[facility] How have you been since you were released from the hospital? Patient stated she is doing much better and has no more pain.  Any questions or concerns? No  Items Reviewed: Did the pt receive and understand the discharge instructions provided? Yes  Medications obtained and verified?  No new medications sent to pharmacy  Other? No  Any new allergies since your discharge? No  Dietary orders reviewed? No Do you have support at home? Yes   Home Care and Equipment/Supplies: Were home health services ordered? not applicable If so, what is the name of the agency? N/A  Has the agency set up a time to come to the patient's home? not applicable Were any new equipment or medical supplies ordered?  No What is the name of the medical supply agency? N/A Were you able to get the supplies/equipment? not applicable Do you have any questions related to the use of the equipment or supplies? No  Functional Questionnaire: (I = Independent and D = Dependent) ADLs: I  Bathing/Dressing- I  Meal Prep- I  Eating- I  Maintaining continence- I  Transferring/Ambulation- I  Managing Meds- I  Follow up appointments reviewed:  PCP Hospital f/u appt confirmed? No   Specialist Hospital f/u appt confirmed? No   Are transportation arrangements needed? No  If their condition worsens, is the pt aware to call PCP or go to the Emergency Dept.? Yes Was the patient provided with contact information for the PCP's office or ED? Yes Was to pt encouraged to call back with questions or concerns? Yes

## 2021-11-25 ENCOUNTER — Other Ambulatory Visit: Payer: Self-pay | Admitting: *Deleted

## 2021-11-25 DIAGNOSIS — D500 Iron deficiency anemia secondary to blood loss (chronic): Secondary | ICD-10-CM

## 2021-12-01 ENCOUNTER — Other Ambulatory Visit: Payer: Self-pay

## 2021-12-01 ENCOUNTER — Inpatient Hospital Stay: Payer: Medicaid Other | Attending: Oncology

## 2021-12-01 DIAGNOSIS — D500 Iron deficiency anemia secondary to blood loss (chronic): Secondary | ICD-10-CM | POA: Diagnosis present

## 2021-12-01 DIAGNOSIS — Z79899 Other long term (current) drug therapy: Secondary | ICD-10-CM | POA: Diagnosis not present

## 2021-12-01 DIAGNOSIS — D649 Anemia, unspecified: Secondary | ICD-10-CM

## 2021-12-01 LAB — CBC WITH DIFFERENTIAL/PLATELET
Abs Immature Granulocytes: 0.01 10*3/uL (ref 0.00–0.07)
Basophils Absolute: 0 10*3/uL (ref 0.0–0.1)
Basophils Relative: 1 %
Eosinophils Absolute: 0.1 10*3/uL (ref 0.0–0.5)
Eosinophils Relative: 2 %
HCT: 37.1 % (ref 36.0–46.0)
Hemoglobin: 11.7 g/dL — ABNORMAL LOW (ref 12.0–15.0)
Immature Granulocytes: 0 %
Lymphocytes Relative: 42 %
Lymphs Abs: 1.5 10*3/uL (ref 0.7–4.0)
MCH: 26.4 pg (ref 26.0–34.0)
MCHC: 31.5 g/dL (ref 30.0–36.0)
MCV: 83.7 fL (ref 80.0–100.0)
Monocytes Absolute: 0.2 10*3/uL (ref 0.1–1.0)
Monocytes Relative: 5 %
Neutro Abs: 1.8 10*3/uL (ref 1.7–7.7)
Neutrophils Relative %: 50 %
Platelets: 239 10*3/uL (ref 150–400)
RBC: 4.43 MIL/uL (ref 3.87–5.11)
RDW: 13.5 % (ref 11.5–15.5)
WBC: 3.6 10*3/uL — ABNORMAL LOW (ref 4.0–10.5)
nRBC: 0 % (ref 0.0–0.2)

## 2021-12-01 LAB — VITAMIN B12: Vitamin B-12: 224 pg/mL (ref 180–914)

## 2021-12-01 LAB — IRON AND TIBC
Iron: 61 ug/dL (ref 28–170)
Saturation Ratios: 18 % (ref 10.4–31.8)
TIBC: 333 ug/dL (ref 250–450)
UIBC: 272 ug/dL

## 2021-12-01 LAB — FERRITIN: Ferritin: 18 ng/mL (ref 11–307)

## 2021-12-03 ENCOUNTER — Inpatient Hospital Stay: Payer: Medicaid Other | Admitting: Oncology

## 2021-12-04 ENCOUNTER — Emergency Department
Admission: EM | Admit: 2021-12-04 | Discharge: 2021-12-04 | Disposition: A | Discharge status: Home / Self Care | Payer: Medicaid Other | Attending: Emergency Medicine | Admitting: Emergency Medicine

## 2021-12-04 ENCOUNTER — Other Ambulatory Visit: Payer: Self-pay

## 2021-12-04 ENCOUNTER — Emergency Department: Payer: Medicaid Other

## 2021-12-04 ENCOUNTER — Encounter: Payer: Self-pay | Admitting: Intensive Care

## 2021-12-04 DIAGNOSIS — D500 Iron deficiency anemia secondary to blood loss (chronic): Secondary | ICD-10-CM | POA: Diagnosis not present

## 2021-12-04 DIAGNOSIS — R079 Chest pain, unspecified: Secondary | ICD-10-CM | POA: Diagnosis not present

## 2021-12-04 DIAGNOSIS — R002 Palpitations: Secondary | ICD-10-CM | POA: Insufficient documentation

## 2021-12-04 LAB — BASIC METABOLIC PANEL
Anion gap: 5 (ref 5–15)
BUN: 11 mg/dL (ref 6–20)
CO2: 24 mmol/L (ref 22–32)
Calcium: 9 mg/dL (ref 8.9–10.3)
Chloride: 106 mmol/L (ref 98–111)
Creatinine, Ser: 0.71 mg/dL (ref 0.44–1.00)
GFR, Estimated: >60 mL/min (ref >60)
Glucose, Bld: 104 mg/dL — ABNORMAL HIGH (ref 70–99)
Potassium: 3.7 mmol/L (ref 3.5–5.1)
Sodium: 135 mmol/L (ref 135–145)

## 2021-12-04 LAB — LIPASE, BLOOD: Lipase: 27 U/L (ref 11–51)

## 2021-12-04 LAB — TROPONIN I (HIGH SENSITIVITY)
Troponin I (High Sensitivity): <2 ng/L (ref <18)
Troponin I (High Sensitivity): <2 ng/L (ref <18)

## 2021-12-04 LAB — HEPATIC FUNCTION PANEL
ALT: 9 U/L (ref 0–44)
AST: 15 U/L (ref 15–41)
Albumin: 4.2 g/dL (ref 3.5–5.0)
Alkaline Phosphatase: 42 U/L (ref 38–126)
Bilirubin, Direct: <0.1 mg/dL (ref 0.0–0.2)
Total Bilirubin: 0.4 mg/dL (ref 0.3–1.2)
Total Protein: 7.4 g/dL (ref 6.5–8.1)

## 2021-12-04 LAB — CBC
HCT: 37.9 % (ref 36.0–46.0)
Hemoglobin: 12 g/dL (ref 12.0–15.0)
MCH: 26.7 pg (ref 26.0–34.0)
MCHC: 31.7 g/dL (ref 30.0–36.0)
MCV: 84.2 fL (ref 80.0–100.0)
Platelets: 247 10*3/uL (ref 150–400)
RBC: 4.5 MIL/uL (ref 3.87–5.11)
RDW: 13.2 % (ref 11.5–15.5)
WBC: 3.7 10*3/uL — ABNORMAL LOW (ref 4.0–10.5)
nRBC: 0 % (ref 0.0–0.2)

## 2021-12-04 LAB — POC URINE PREG, ED: Preg Test, Ur: NEGATIVE

## 2021-12-04 NOTE — ED Provider Notes (Signed)
[facility] Provider Note  Patient Contact: 3:25 PM (approximate)   History   Chest Pain   HPI  Jasmine Gordon is a 36 y.o. female with an unremarkable past medical history, presents to the emergency department with concern for possible palpitations.  Patient denies pain and states that her heart rate feels normal to her.  She denies chest tightness or radiation of pain.  No associated shortness of breath.  She takes no contraceptives and has had no recent travel.  She denies daily smoking.  Denies any history of hypertension or hyperlipidemia.  She does have a history of cardiovascular disease on her dad side of the family.  She denies nausea, vomiting or abdominal pain.  No fever or chills at home.  She states that she has had recent family stress and had a phone call from her brother-in-law this morning shortly before symptoms started.      Physical Exam   Triage Vital Signs: ED Triage Vitals  Enc Vitals Group     BP 12/04/21 0938 135/90     Pulse Rate 12/04/21 0938 (!) 101     Resp 12/04/21 0938 20     Temp 12/04/21 0938 98.6 F (37 C)     Temp Source 12/04/21 0938 Oral     SpO2 12/04/21 0938 100 %     Weight 12/04/21 0939 237 lb (107.5 kg)     Height 12/04/21 0939 5\' 7"  (1.702 m)     Head Circumference --      Peak Flow --      Pain Score 12/04/21 0939 10     Pain Loc --      Pain Edu? --      Excl. in GC? --     Most recent vital signs: Vitals:   12/04/21 0938 12/04/21 1436  BP: 135/90 117/66  Pulse: (!) 101 78  Resp: 20 15  Temp: 98.6 F (37 C)   SpO2: 100% 100%     General: Alert and in no acute distress. Eyes:  PERRL. EOMI. Head: No acute traumatic findings ENT:      Ears: Tms are pearly.       Nose: No congestion/rhinnorhea.      Mouth/Throat: Mucous membranes are moist. Neck: No stridor. No cervical spine tenderness to palpation. Hematological/Lymphatic/Immunilogical: No cervical lymphadenopathy. Cardiovascular:   Good peripheral perfusion Respiratory: Normal respiratory effort without tachypnea or retractions. Lungs CTAB. Good air entry to the bases with no decreased or absent breath sounds. Gastrointestinal: Bowel sounds 4 quadrants. Soft and nontender to palpation. No guarding or rigidity. No palpable masses. No distention. No CVA tenderness. Musculoskeletal: Full range of motion to all extremities.  Neurologic:  No gross focal neurologic deficits are appreciated.  Skin:   No rash noted Other:   ED Results / Procedures / Treatments   Labs (all labs ordered are listed, but only abnormal results are displayed) Labs Reviewed  BASIC METABOLIC PANEL - Abnormal; Notable for the following components:      Result Value   Glucose, Bld 104 (*)    All other components within normal limits  CBC - Abnormal; Notable for the following components:   WBC 3.7 (*)    All other components within normal limits  LIPASE, BLOOD  HEPATIC FUNCTION PANEL  POC URINE PREG, ED  TROPONIN I (HIGH SENSITIVITY)  TROPONIN I (HIGH SENSITIVITY)     EKG  Normal sinus rhythm.  No ST segment elevation or other apparent arrhythmia.  RADIOLOGY  I personally viewed and evaluated these images as part of my medical decision making, as well as reviewing the written report by the radiologist.  ED Provider Interpretation: No consolidations, opacities, infiltrates or evidence of pneumothorax.      MEDICATIONS ORDERED IN ED: Medications - No data to display   IMPRESSION / MDM / ASSESSMENT AND PLAN / ED COURSE  I reviewed the triage vital signs and the nursing notes.                              Differential diagnosis includes, but is not limited to, palpitations, arrhythmia, STEMI, pneumothorax, pregnancy, nonspecific chest pain  Assessment and Plan:  Palpitations:  36 year old female presents to the emergency department with sensation of palpitations that started after patient had a stressful phone  conversation.  Vital signs are reassuring at triage.  On physical exam, patient was alert, active and nontoxic-appearing.  She denied any pain and states that she was not currently symptomatic.  CBC and BMP within reference range.  Both sets of troponin within reference range.  No concerns on hepatic function panel or lipase.  Urine pregnancy test was negative.  Chest x-ray shows no cardiac enlargement or evidence of pneumothorax or findings concerning for pneumonia.  Patient was given outpatient cardiology follow-up.  Patient feels comfortable being discharged given reassuring work-up.   Clinical Course as of 12/04/21 2156  Sat Dec 04, 2021  1508 Potassium: 3.7 [JW]  1517 Chloride: 106 [JW]    Clinical Course User Index [JW] Orvil Feil, PA-C     FINAL CLINICAL IMPRESSION(S) / ED DIAGNOSES   Final diagnoses:  Palpitations     Rx / DC Orders   ED Discharge Orders     None        Note:  This document was prepared using Dragon voice recognition software and may include unintentional dictation errors.   Pia Mau [place], Cordelia Poche 12/04/21 2156    Georga Hacking, MD 12/05/21 1759

## 2021-12-04 NOTE — ED Triage Notes (Signed)
First Nurse NOte:  C/O palpitations this morning.  States woke up this morning and then palpitations began.  Patient appears anxious .  Skin warm and dry. NAD

## 2021-12-04 NOTE — ED Triage Notes (Signed)
Patient c/o throbbing chest pain after awakening this AM. Reports feeling irritable.

## 2021-12-04 NOTE — Discharge Instructions (Signed)
You can follow-up with cardiology as needed. 

## 2021-12-04 NOTE — ED Notes (Addendum)
Pt to ED for palpitations. Has had them intermittently for a few months, usually about once/month and not lasting long.   This morning, felt palpitations for several hours that resolved about 2 hours ago. States heart was not racing, just pounding and denies chest pain.  Denies SOB. Pt just wanted to be checked out and has no other concerns other than the palpitations. States her father had cardiac problems and died 2 years ago and thus wanted to be sure all ok.

## 2021-12-05 ENCOUNTER — Telehealth: Payer: Self-pay

## 2021-12-05 NOTE — Telephone Encounter (Signed)
Transition Care Management Follow-up Telephone Call Date of discharge and from where: 12/04/2021-[facility] How have you been since you were released from the hospital? Patient stated she is doing better.  Any questions or concerns? No  Items Reviewed: Did the pt receive and understand the discharge instructions provided? Yes  Medications obtained and verified?  No new medications given at discharge  Other? No  Any new allergies since your discharge? No  Dietary orders reviewed? No Do you have support at home? Yes   Home Care and Equipment/Supplies: Were home health services ordered? not applicable If so, what is the name of the agency? N/A  Has the agency set up a time to come to the patient's home? not applicable Were any new equipment or medical supplies ordered?  No What is the name of the medical supply agency? N/A Were you able to get the supplies/equipment? not applicable Do you have any questions related to the use of the equipment or supplies? No  Functional Questionnaire: (I = Independent and D = Dependent) ADLs: I  Bathing/Dressing- I  Meal Prep- I  Eating- I  Maintaining continence- I  Transferring/Ambulation- I  Managing Meds- I  Follow up appointments reviewed:  PCP Hospital f/u appt confirmed? No   Specialist Hospital f/u appt confirmed? No   Are transportation arrangements needed? No  If their condition worsens, is the pt aware to call PCP or go to the Emergency Dept.? Yes Was the patient provided with contact information for the PCP's office or ED? Yes Was to pt encouraged to call back with questions or concerns? Yes

## 2021-12-08 LAB — ALPHA-THALASSEMIA GENOTYPR

## 2021-12-09 ENCOUNTER — Inpatient Hospital Stay (HOSPITAL_BASED_OUTPATIENT_CLINIC_OR_DEPARTMENT_OTHER): Payer: Medicaid Other | Admitting: Oncology

## 2021-12-09 ENCOUNTER — Encounter: Payer: Self-pay | Admitting: Oncology

## 2021-12-09 DIAGNOSIS — E538 Deficiency of other specified B group vitamins: Secondary | ICD-10-CM | POA: Diagnosis not present

## 2021-12-09 DIAGNOSIS — D500 Iron deficiency anemia secondary to blood loss (chronic): Secondary | ICD-10-CM

## 2021-12-09 NOTE — Progress Notes (Signed)
HEMATOLOGY-ONCOLOGY TeleHEALTH VISIT PROGRESS NOTE  I connected with Jasmine Gordon on 12/09/21 at  2:45 PM EST by video enabled telemedicine visit and verified that I am speaking with the correct person using two identifiers. I discussed the limitations, risks, security and privacy concerns of performing an evaluation and management service by telemedicine and the availability of in-person appointments. I also discussed with the patient that there may be a patient responsible charge related to this service. The patient expressed understanding and agreed to proceed.   Other persons participating in the visit and their role in the encounter:  None  Patient's location: she is in her car, was not driving during the encounter Provider's location: office Chief Complaint: Iron deficiency anemia   INTERVAL HISTORY Jasmine Gordon is a 36 y.o. female who has above history reviewed by me today presents for follow up visit for management of iron deficiency anemia Patient was recently in emergency room for palpitation.  She feels that she has been through a lot of stress in life.  She takes vitamin B12 intermittently. Today she feels that she has some stomach discomfort, attributes to eating bad food.   Review of Systems  Constitutional:  Positive for fatigue. Negative for appetite change, chills and fever.  HENT:   Negative for hearing loss and voice change.   Eyes:  Negative for eye problems.  Respiratory:  Negative for chest tightness and cough.   Cardiovascular:  Negative for chest pain.  Gastrointestinal:  Negative for abdominal distention, abdominal pain and blood in stool.  Endocrine: Negative for hot flashes.  Genitourinary:  Negative for difficulty urinating and frequency.   Musculoskeletal:  Negative for arthralgias.  Skin:  Negative for itching and rash.  Neurological:  Negative for extremity weakness.  Hematological:  Negative for adenopathy.  Psychiatric/Behavioral:   Negative for confusion.    Past Medical History:  Diagnosis Date   Allergy    Anemia    Anxiety    Asthma    Migraine    Obese    Panic attack    PTSD (post-traumatic stress disorder) 2016   Past Surgical History:  Procedure Laterality Date   APPENDECTOMY     LEEP  12/2020   CIN II, negative margins   Plantar warts      Family History  Problem Relation Age of Onset   Diabetes Mother    Depression Mother    Mental retardation Mother    Bipolar disorder Mother    Diabetes Father    Stroke Maternal Grandfather    Diabetes Maternal Grandfather     Social History   Socioeconomic History   Marital status: Married    Spouse name: Not on file   Number of children: Not on file   Years of education: Not on file   Highest education level: Not on file  Occupational History   Not on file  Tobacco Use   Smoking status: Never   Smokeless tobacco: Never  Vaping Use   Vaping Use: Never used  Substance and Sexual Activity   Alcohol use: Not Currently    Alcohol/week: 1.0 standard drink    Types: 1 Glasses of wine per week   Drug use: No   Sexual activity: Yes    Birth control/protection: Condom  Other Topics Concern   Not on file  Social History Narrative   Not on file   Social Determinants of Health   Financial Resource Strain: Not on file  Food Insecurity: Not on file  Transportation Needs: Not on file  Physical Activity: Not on file  Stress: Not on file  Social Connections: Not on file  Intimate Partner Violence: Not on file    Current Outpatient Medications on File Prior to Visit  Medication Sig Dispense Refill   albuterol (VENTOLIN HFA) 108 (90 Base) MCG/ACT inhaler Inhale 2 puffs into the lungs every 6 (six) hours as needed for wheezing or shortness of breath. 8 g 3   busPIRone (BUSPAR) 5 MG tablet Take 1 tablet (5 mg total) by mouth 2 (two) times daily as needed (anxiety). 60 tablet 2   cetirizine (ZYRTEC ALLERGY) 10 MG tablet Take 1 tablet (10 mg total) by  mouth daily. 14 tablet 0   famotidine (PEPCID) 20 MG tablet Take 1 tablet (20 mg total) by mouth 2 (two) times daily for 14 days. (Patient not taking: Reported on 05/31/2021) 28 tablet 0   fluticasone (FLONASE) 50 MCG/ACT nasal spray Place 1 spray into both nostrils daily. Use for 4-6 weeks then stop and use seasonally or as needed. 16 g 0   gabapentin (NEURONTIN) 100 MG capsule Take 1-3 capsules (100-300 mg total) by mouth at bedtime as needed (nerve pain). May increase dose as needed. 90 capsule 1   HYDROcodone-acetaminophen (NORCO/VICODIN) 5-325 MG tablet Take 1 tablet by mouth every 4 (four) hours as needed for moderate pain. 8 tablet 0   hydrOXYzine (ATARAX) 10 MG tablet Take 1 tablet (10 mg total) by mouth 3 (three) times daily as needed for anxiety. 30 tablet 0   Lidocaine 3 % CREA Apply 1 application topically 4 (four) times daily as needed (pain). 30 g 2   methylPREDNISolone (MEDROL DOSEPAK) 4 MG TBPK tablet Take as directed 21 tablet 0   montelukast (SINGULAIR) 10 MG tablet Take 1 tablet (10 mg total) by mouth at bedtime. 90 tablet 3   nortriptyline (PAMELOR) 10 MG capsule Take 10 mg by mouth at bedtime.     omeprazole (PRILOSEC) 40 MG capsule Take 40 mg by mouth daily.     ondansetron (ZOFRAN ODT) 4 MG disintegrating tablet Take 1 tablet (4 mg total) by mouth every 8 (eight) hours as needed for nausea or vomiting. 20 tablet 0   ondansetron (ZOFRAN) 4 MG tablet Take 1 tablet (4 mg total) by mouth every 8 (eight) hours as needed. 20 tablet 0   pseudoephedrine (SUDAFED) 30 MG tablet Take 1 tablet (30 mg total) by mouth every 6 (six) hours as needed for congestion. 24 tablet 2   sertraline (ZOLOFT) 50 MG tablet Take 1 tablet (50 mg total) by mouth daily. 30 tablet 2   silver sulfADIAZINE (SILVADENE) 1 % cream Apply to affected area daily 50 g 1   triamcinolone cream (KENALOG) 0.5 % Apply 1 application topically 2 (two) times daily. To affected areas, for up to 2 weeks. 30 g 0   vitamin B-12  (CYANOCOBALAMIN) 1000 MCG tablet Take 1 tablet (1,000 mcg total) by mouth daily. 90 tablet 1   Vitamin D, Ergocalciferol, (DRISDOL) 1.25 MG (50000 UNIT) CAPS capsule Take 1 capsule (50,000 Units total) by mouth every 7 (seven) days. 12 capsule 1   No current facility-administered medications on file prior to visit.    Allergies  Allergen Reactions   Reglan [Metoclopramide] Hives and Shortness Of Breath       Observations/Objective: Today's Vitals   12/09/21 1421  PainSc: 0-No pain    There is no height or weight on file to calculate BMI.  Physical Exam Constitutional:  General: She is not in acute distress.   CBC    Component Value Date/Time   WBC 3.7 (L) 12/04/2021 0941   RBC 4.50 12/04/2021 0941   HGB 12.0 12/04/2021 0941   HGB 12.1 09/17/2014 1148   HCT 37.9 12/04/2021 0941   HCT 37.4 09/17/2014 1148   PLT 247 12/04/2021 0941   PLT 202 09/17/2014 1148   MCV 84.2 12/04/2021 0941   MCV 83 09/17/2014 1148   MCH 26.7 12/04/2021 0941   MCHC 31.7 12/04/2021 0941   RDW 13.2 12/04/2021 0941   RDW 14.1 09/17/2014 1148   LYMPHSABS 1.5 12/01/2021 0849   LYMPHSABS 2.1 09/17/2014 1148   MONOABS 0.2 12/01/2021 0849   MONOABS 0.3 09/17/2014 1148   EOSABS 0.1 12/01/2021 0849   EOSABS 0.1 09/17/2014 1148   BASOSABS 0.0 12/01/2021 0849   BASOSABS 0.0 09/17/2014 1148    CMP     Component Value Date/Time   NA 135 12/04/2021 0941   NA 140 09/17/2014 1148   K 3.7 12/04/2021 0941   K 3.5 09/17/2014 1148   CL 106 12/04/2021 0941   CL 107 09/17/2014 1148   CO2 24 12/04/2021 0941   CO2 24 09/17/2014 1148   GLUCOSE 104 (H) 12/04/2021 0941   GLUCOSE 101 (H) 09/17/2014 1148   BUN 11 12/04/2021 0941   BUN 11 09/17/2014 1148   CREATININE 0.71 12/04/2021 0941   CREATININE 0.73 11/04/2020 0950   CALCIUM 9.0 12/04/2021 0941   CALCIUM 8.7 09/17/2014 1148   PROT 7.4 12/04/2021 0946   PROT 7.5 09/17/2014 1148   ALBUMIN 4.2 12/04/2021 0946   ALBUMIN 3.7 09/17/2014 1148   AST  15 12/04/2021 0946   AST 13 (L) 09/17/2014 1148   ALT 9 12/04/2021 0946   ALT 16 09/17/2014 1148   ALKPHOS 42 12/04/2021 0946   ALKPHOS 57 09/17/2014 1148   BILITOT 0.4 12/04/2021 0946   BILITOT 0.4 09/17/2014 1148   GFRNONAA >60 12/04/2021 0941   GFRNONAA 109 11/04/2020 0950   GFRAA 126 11/04/2020 0950     Assessment and Plan: 1. Iron deficiency anemia due to chronic blood loss   2. Low serum vitamin B12   Iron deficiency anemia Labs are reviewed and discussed with patient. Iron panel is normal and hemoglobin level is normal..  No IV Venofer treatments. Continue monitor Hemoglobinopathy evaluation showed normal pattern. Will check negative lpha thalassemia PCR.   Chronic borderline B12 level,  Vitamin B12 continues to be borderline low.  Patient does not take vitamin B12 daily.  I recommend patient to consider vitamin B12 parenteral injections.  Patient wants to think about that and update me if she decides to proceed with parenteral B12.   Follow Up Instructions: 6 months with lab and MD-patient prefers virtual visits.   I discussed the assessment and treatment plan with the patient. The patient was provided an opportunity to ask questions and all were answered. The patient agreed with the plan and demonstrated an understanding of the instructions.  The patient was advised to call back or seek an in-person evaluation if the symptoms worsen or if the condition fails to improve as anticipated.     [name], MD 12/09/2021 8:59 PM

## 2021-12-16 ENCOUNTER — Ambulatory Visit: Payer: Medicaid Other | Admitting: Obstetrics and Gynecology

## 2021-12-17 ENCOUNTER — Encounter: Payer: Self-pay | Admitting: Obstetrics and Gynecology

## 2021-12-17 ENCOUNTER — Other Ambulatory Visit (HOSPITAL_COMMUNITY)
Admission: RE | Admit: 2021-12-17 | Discharge: 2021-12-17 | Disposition: A | Discharge status: Home / Self Care | Payer: Medicaid Other | Source: Ambulatory Visit | Attending: Obstetrics and Gynecology | Admitting: Obstetrics and Gynecology

## 2021-12-17 ENCOUNTER — Ambulatory Visit (INDEPENDENT_AMBULATORY_CARE_PROVIDER_SITE_OTHER): Payer: Medicaid Other | Admitting: Obstetrics and Gynecology

## 2021-12-17 ENCOUNTER — Other Ambulatory Visit: Payer: Self-pay

## 2021-12-17 VITALS — BP 130/74 | Ht 67.0 in | Wt 238.6 lb

## 2021-12-17 DIAGNOSIS — Z113 Encounter for screening for infections with a predominantly sexual mode of transmission: Secondary | ICD-10-CM

## 2021-12-17 DIAGNOSIS — N871 Moderate cervical dysplasia: Secondary | ICD-10-CM

## 2021-12-17 DIAGNOSIS — N771 Vaginitis, vulvitis and vulvovaginitis in diseases classified elsewhere: Secondary | ICD-10-CM | POA: Diagnosis not present

## 2021-12-17 DIAGNOSIS — R875 Abnormal microbiological findings in specimens from female genital organs: Secondary | ICD-10-CM

## 2021-12-17 DIAGNOSIS — Z01419 Encounter for gynecological examination (general) (routine) without abnormal findings: Secondary | ICD-10-CM

## 2021-12-17 DIAGNOSIS — F32A Depression, unspecified: Secondary | ICD-10-CM | POA: Diagnosis not present

## 2021-12-17 DIAGNOSIS — Z124 Encounter for screening for malignant neoplasm of cervix: Secondary | ICD-10-CM | POA: Diagnosis not present

## 2021-12-17 DIAGNOSIS — F419 Anxiety disorder, unspecified: Secondary | ICD-10-CM

## 2021-12-17 MED ORDER — NYSTATIN 100000 UNIT/GM EX POWD: 1.0000 "application " | Freq: Three times a day (TID) | CUTANEOUS | 0 refills | Status: DC

## 2021-12-17 MED ORDER — FLUCONAZOLE 150 MG PO TABS: 150.0000 mg | ORAL_TABLET | ORAL | 3 refills | Status: AC

## 2021-12-17 NOTE — Progress Notes (Signed)
Gynecology Annual Exam  PCP: Jasmine Cords, DO  Chief Complaint:  Chief Complaint  Patient presents with   Gynecologic Exam    Annual exam    History of Present Illness: Patient is a 36 y.o. G3P3003 presents for annual exam.  She reports that she has been having bouts of dizziness.  She feels like these may be related to anxiety.  She feels that she has noticed increasing difficulty with anxiety surrounding her menstrual cycle.  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  She also notes issues with recurrent yeast infections especially the week before her menstrual cycle.She generally washes her bottom with soap.  LMP: Patient's last menstrual period was 11/24/2021 (exact date). Average Interval: monthly Duration of flow: 5 days Heavy Menses: no Dysmenorrhea: no  She denies passage of large clots She denies sensations of gushing or flooding of blood. She denies accidents where she bleeds through her clothing. She denies that she changes a saturated pad or tampon more frequently than every hour.  She denies that pain from her periods limits her activities.  The patient does perform self breast exams.  There is no notable family history of breast or ovarian cancer in her family.  The patient reports her exercise generally consists of walking 3 days a week .  The patient reports current symptoms of anxiety    PHQ-9: 4 GAD-7: 5     Review of Systems: Review of Systems  Constitutional:  Negative for chills, fever, malaise/fatigue and weight loss.  HENT:  Negative for congestion, hearing loss and sinus pain.   Eyes:  Negative for blurred vision and double vision.  Respiratory:  Negative for cough, sputum production, shortness of breath and wheezing.   Cardiovascular:  Negative for chest pain, palpitations, orthopnea and leg swelling.  Gastrointestinal:  Negative for abdominal pain, constipation, diarrhea, nausea and vomiting.   Genitourinary:  Negative for dysuria, flank pain, frequency, hematuria and urgency.  Musculoskeletal:  Negative for back pain, falls and joint pain.  Skin:  Negative for itching and rash.  Neurological:  Positive for dizziness. Negative for headaches.  Psychiatric/Behavioral:  Negative for depression, substance abuse and suicidal ideas. The patient is nervous/anxious.    Past Medical History:  Past Medical History:  Diagnosis Date   Allergy    Anemia    Anxiety    Asthma    Migraine    Obese    Panic attack    PTSD (post-traumatic stress disorder) 2016    Past Surgical History:  Past Surgical History:  Procedure Laterality Date   APPENDECTOMY     LEEP  12/2020   CIN II, negative margins   Plantar warts      Gynecologic History:  Patient's last menstrual period was 11/24/2021 (exact date). Menarche: 15  History of fibroids, polyps, or ovarian cysts? : no  History of PCOS? no Hstory of Endometriosis? no History of abnormal pap smears? yes Have you had any sexually transmitted infections in the past? yes  She reports HPV vaccination in the past.   Last Pap: CIN 2 on LEEP in 2022  She identifies as a female. She is sexually active with men.   She denies dyspareunia. She denies postcoital bleeding.  She currently uses condoms for contraception.    Obstetric History: [id number]  Family History:  Family History  Problem Relation Age of Onset   Diabetes Mother    Depression Mother    Mental retardation Mother  Bipolar disorder Mother    Diabetes Father    Stroke Maternal Grandfather    Diabetes Maternal Grandfather     Social History:  Social History   Socioeconomic History   Marital status: Married    Spouse name: Not on file   Number of children: Not on file   Years of education: Not on file   Highest education level: Not on file  Occupational History   Not on file  Tobacco Use   Smoking status: Never   Smokeless tobacco: Never  Vaping Use    Vaping Use: Never used  Substance and Sexual Activity   Alcohol use: Not Currently    Alcohol/week: 1.0 standard drink    Types: 1 Glasses of wine per week   Drug use: No   Sexual activity: Yes    Birth control/protection: Condom  Other Topics Concern   Not on file  Social History Narrative   Not on file   Social Determinants of Health   Financial Resource Strain: Not on file  Food Insecurity: Not on file  Transportation Needs: Not on file  Physical Activity: Not on file  Stress: Not on file  Social Connections: Not on file  Intimate Partner Violence: Not on file    Allergies:  Allergies  Allergen Reactions   Reglan [Metoclopramide] Hives and Shortness Of Breath    Medications: Prior to Admission medications   Medication Sig Start Date End Date Taking? Authorizing Provider  albuterol (VENTOLIN HFA) 108 (90 Base) MCG/ACT inhaler Inhale 2 puffs into the lungs every 6 (six) hours as needed for wheezing or shortness of breath. 07/27/21  Yes Jasmine Gordon, Jasmine Doughty, DO  busPIRone (BUSPAR) 5 MG tablet Take 1 tablet (5 mg total) by mouth 2 (two) times daily as needed (anxiety). 08/13/21  Yes Jasmine Gordon, Jasmine Doughty, DO  cetirizine (ZYRTEC ALLERGY) 10 MG tablet Take 1 tablet (10 mg total) by mouth daily. 03/27/21 03/27/22 Yes Jasmine Gordon, Jasmine Keens, NP  fluticasone (FLONASE) 50 MCG/ACT nasal spray Place 1 spray into both nostrils daily. Use for 4-6 weeks then stop and use seasonally or as needed. 03/27/21  Yes Jasmine Fenton, NP  gabapentin (NEURONTIN) 100 MG capsule Take 1-3 capsules (100-300 mg total) by mouth at bedtime as needed (nerve pain). May increase dose as needed. 10/28/21  Yes Jasmine Gordon, Jasmine Doughty, DO  HYDROcodone-acetaminophen (NORCO/VICODIN) 5-325 MG tablet Take 1 tablet by mouth every 4 (four) hours as needed for moderate pain. 11/04/21 11/04/22 Yes Jasmine Gordon, Jasmine Bills, Jasmine Gordon  hydrOXYzine (ATARAX) 10 MG tablet Take 1 tablet (10 mg total) by mouth 3 (three) times daily as needed  for anxiety. 10/23/21  Yes Jasmine Pear, MD  Lidocaine 3 % CREA Apply 1 application topically 4 (four) times daily as needed (pain). 11/04/21  Yes Jasmine Gordon, Jasmine Bills, Jasmine Gordon  methylPREDNISolone (MEDROL DOSEPAK) 4 MG TBPK tablet Take as directed 10/08/21  Yes Paulette Blanch, MD  montelukast (SINGULAIR) 10 MG tablet Take 1 tablet (10 mg total) by mouth at bedtime. 11/04/20  Yes Jasmine Gordon, Jasmine Doughty, DO  nortriptyline (PAMELOR) 10 MG capsule Take 10 mg by mouth at bedtime. 10/10/19  Yes [provider]  omeprazole (PRILOSEC) 40 MG capsule Take 40 mg by mouth daily. 09/26/18  Yes [provider]  ondansetron (ZOFRAN ODT) 4 MG disintegrating tablet Take 1 tablet (4 mg total) by mouth every 8 (eight) hours as needed for nausea or vomiting. 09/26/21  Yes Vanessa Harriman, MD  ondansetron (ZOFRAN) 4 MG tablet Take 1 tablet (4  mg total) by mouth every 8 (eight) hours as needed. 09/24/19  Yes Jasmine Pear, MD  pseudoephedrine (SUDAFED) 30 MG tablet Take 1 tablet (30 mg total) by mouth every 6 (six) hours as needed for congestion. 09/08/21 09/08/22 Yes [place]ones[place], [place], MD  sertraline (ZOLOFT) 50 MG tablet Take 1 tablet (50 mg total) by mouth daily. 08/13/21  Yes Jasmine Gordon, Jasmine Doughty, DO  silver sulfADIAZINE (SILVADENE) 1 % cream Apply to affected area daily 11/04/21 11/04/22 Yes Jasmine Gordon, Jasmine Bills, Jasmine Gordon  triamcinolone cream (KENALOG) 0.5 % Apply 1 application topically 2 (two) times daily. To affected areas, for up to 2 weeks. 03/01/21  Yes Jasmine Gordon, Jasmine Doughty, DO  vitamin B-12 (CYANOCOBALAMIN) 1000 MCG tablet Take 1 tablet (1,000 mcg total) by mouth daily. 07/27/21  Yes Jasmine Gordon, Jasmine Doughty, DO  Vitamin D, Ergocalciferol, (DRISDOL) 1.25 MG (50000 UNIT) CAPS capsule Take 1 capsule (50,000 Units total) by mouth every 7 (seven) days. 07/27/21  Yes Jasmine Gordon, Jasmine Doughty, DO  famotidine (PEPCID) 20 MG tablet Take 1 tablet (20 mg total) by mouth 2 (two) times daily for 14  days. Patient not taking: Reported on 05/31/2021 11/16/20 11/30/20  Victorino Dike, FNP    Physical Exam Vitals: Blood pressure 130/74, height 5\' 7"  (1.702 m), weight 238 lb 9.6 oz (108.2 kg), last menstrual period 11/24/2021.  Physical Exam Constitutional:      Appearance: She is well-developed.  Genitourinary:     Genitourinary Comments: External: No lesions noted.  Erythematous skin in the groin and labia bilaterally. Speculum examination: Normal appearing cervix. No blood in the vaginal vault. Thick white discharge.   Bimanual examination: Uterus midline, non-tender, normal in size, shape and contour.  No CMT. No adnexal masses. No adnexal tenderness. Pelvis not fixed.  Breast Exam: breast equal without skin changes, nipple discharge, breast lump or enlarged lymph nodes   HENT:     Head: Normocephalic and atraumatic.  Neck:     Thyroid: No thyromegaly.  Cardiovascular:     Rate and Rhythm: Normal rate and regular rhythm.     Heart sounds: Normal heart sounds.  Pulmonary:     Effort: Pulmonary effort is normal.     Breath sounds: Normal breath sounds.  Abdominal:     General: Bowel sounds are normal. There is no distension.     Palpations: Abdomen is soft. There is no mass.  Musculoskeletal:     Cervical back: Neck supple.  Neurological:     Mental Status: She is alert and oriented to person, place, and time.  Skin:    General: Skin is warm and dry.  Psychiatric:        Behavior: Behavior normal.        Thought Content: Thought content normal.        Judgment: Judgment normal.  Vitals reviewed.     Female chaperone present for pelvic and breast  portions of the physical exam  Assessment: 36 y.o. G3P3003 routine annual exam  Plan: Problem List Items Addressed This Visit       Other   Anxiety and depression   Other Visit Diagnoses     Encounter for annual routine gynecological examination    -  Primary   Cervical cancer screening       Relevant Orders    Cytology - PAP   Encounter for gynecological examination without abnormal finding       Dysplasia of cervix, high grade CIN 2       Relevant Orders   Cytology -  PAP   Vaginitis, vulvitis and vulvovaginitis in diseases classified elsewhere       Relevant Medications   fluconazole (DIFLUCAN) 150 MG tablet   nystatin (MYCOSTATIN/NYSTOP) powder   Other Relevant Orders   NuSwab BV and Candida, NAA   Abnormal microbiological finding in specimen from female genital organ       Relevant Medications   fluconazole (DIFLUCAN) 150 MG tablet   nystatin (MYCOSTATIN/NYSTOP) powder   Other Relevant Orders   NuSwab BV and Candida, NAA   Screen for STD (sexually transmitted disease)       Relevant Orders   HEP, RPR, HIV Panel       1) STI screening was offered and accepted  2) Pap smear today. - history of LEEP for CIN 2. Discussed yearly screening intervals and long term screening at a minimal of 3 year intervals for 25 years.  3) Contraception - condoms  4) Routine healthcare maintenance including cholesterol, diabetes screening discussed managed by PCP  5) Anxiety- provided with list of local resources.  Discussed initiation of medication if she is interested.  Discussed signs of menstrual dysmorphic disorder.  6) Recurrent yeast vaginitis-patient discontinue usage of soap on bottom.  Diflucan prescription sent with refills.  Nystatin for topical powder .  Discussed she can use over-the-counter Monistat to apply to external labia as well.  Adrian Prows MD, [facility], [facility] 12/17/2021 9:32 AM

## 2021-12-17 NOTE — Patient Instructions (Addendum)
[facility] [place] for Calcium and Vitamin D  Age (yr) Calcium Recommended Dietary Allowance (mg/day) Vitamin D Recommended Dietary Allowance (international units/day)  9-18 1,300 600  19-50 1,000 600  51-70 1,200 600  71 and older 1,200 800  Data from Institute of Medicine. Dietary reference intakes: calcium, vitamin D. [place], [place]: [employer]; 2011.   Exercising to Stay Healthy To become healthy and stay healthy, it is recommended that you do moderate-intensity and vigorous-intensity exercise. You can tell that you are exercising at a moderate intensity if your heart starts beating faster and you start breathing faster but can still hold a conversation. You can tell that you are exercising at a vigorous intensity if you are breathing much harder and faster and cannot hold a conversation while exercising. How can exercise benefit me? Exercising regularly is important. It has many health benefits, such as: Improving overall fitness, flexibility, and endurance. Increasing bone density. Helping with weight control. Decreasing body fat. Increasing muscle strength and endurance. Reducing stress and tension, anxiety, depression, or anger. Improving overall health. What guidelines should I follow while exercising? Before you start a new exercise program, talk with your health care provider. Do not exercise so much that you hurt yourself, feel dizzy, or get very short of breath. Wear comfortable clothes and wear shoes with good support. Drink plenty of water while you exercise to prevent dehydration or heat stroke. Work out until your breathing and your heartbeat get faster (moderate intensity). How often should I exercise? Choose an activity that you enjoy, and set realistic goals. Your health care provider can help you make an activity plan that is individually designed and works best for you. Exercise regularly as told by your health  care provider. This may include: Doing strength training two times a week, such as: Lifting weights. Using resistance bands. Push-ups. Sit-ups. Yoga. Doing a certain intensity of exercise for a given amount of time. Choose from these options: A total of 150 minutes of moderate-intensity exercise every week. A total of 75 minutes of vigorous-intensity exercise every week. A mix of moderate-intensity and vigorous-intensity exercise every week. Children, pregnant women, people who have not exercised regularly, people who are overweight, and older adults may need to talk with a health care provider about what activities are safe to perform. If you have a medical condition, be sure to talk with your health care provider before you start a new exercise program. What are some exercise ideas? Moderate-intensity exercise ideas include: Walking 1 mile (1.6 km) in about 15 minutes. Biking. Hiking. Golfing. Dancing. Water aerobics. Vigorous-intensity exercise ideas include: Walking 4.5 miles (7.2 km) or more in about 1 hour. Jogging or running 5 miles (8 km) in about 1 hour. Biking 10 miles (16.1 km) or more in about 1 hour. Lap swimming. Roller-skating or in-line skating. Cross-country skiing. Vigorous competitive sports, such as football, basketball, and soccer. Jumping rope. Aerobic dancing. What are some everyday activities that can help me get exercise? Yard work, such as: Psychologist, educational. Raking and bagging leaves. Washing your car. Pushing a stroller. Shoveling snow. Gardening. Washing windows or floors. How can I be more active in my day-to-day activities? Use stairs instead of an elevator. Take a walk during your lunch break. If you drive, park your car farther away from your work or school. If you take public transportation, get off one stop early and walk the rest of the way. Stand up or walk around during all of  your indoor phone calls. Get up, stretch, and walk  around every 30 minutes throughout the day. Enjoy exercise with a friend. Support to continue exercising will help you keep a regular routine of activity. Where to find more information You can find more information about exercising to stay healthy from: U.S. Department of Health and Human Services: [employer].at Centers for Disease Control and Prevention (CDC): http://www.wolf.info/ Summary Exercising regularly is important. It will improve your overall fitness, flexibility, and endurance. Regular exercise will also improve your overall health. It can help you control your weight, reduce stress, and improve your bone density. Do not exercise so much that you hurt yourself, feel dizzy, or get very short of breath. Before you start a new exercise program, talk with your health care provider. This information is not intended to replace advice given to you by your health care provider. Make sure you discuss any questions you have with your health care provider. Document Revised: 03/05/2021 Document Reviewed: 03/05/2021 Elsevier Patient Education  2022 [place] Eating There are many ways to save money at the grocery store and continue to eat healthy. You can be successful if you: Plan meals according to your budget. Make a grocery list and only purchase food according to your grocery list. Prepare food yourself at home. What are tips for following this plan? Reading food labels Compare food labels between brand name foods and the store brand. Often the nutritional value is the same, but the store brand is lower cost. Look for products that do not have added sugar, fat, or salt (sodium). These often cost the same but are healthier for you. Products may be labeled as: Sugar-free. Nonfat. Low-fat. Sodium-free. Low-sodium. Look for lean ground beef labeled as at least 92% lean and 8% fat. Shopping  Buy only the items on your grocery list and go only to the areas of the store  that have the items on your list. Use coupons only for foods and brands you normally buy. Avoid buying items you wouldn't normally buy simply because they are on sale. Check online and in newspapers for weekly deals. Buy healthy items from the bulk bins when available, such as herbs, spices, flour, pasta, nuts, and dried fruit. Buy fruits and vegetables that are in season. Prices are usually lower on in-season produce. Look at the unit price on the price tag. Use it to compare different brands and sizes to find out which item is the best deal. Choose healthy items that are often low-cost, such as carrots, potatoes, apples, bananas, and oranges. Dried or canned beans are a low-cost protein source. Buy in bulk and freeze extra food. Items you can buy in bulk include meats, fish, poultry, frozen fruits, and frozen vegetables. Avoid buying "ready-to-eat" foods, such as pre-cut fruits and vegetables and pre-made salads. If possible, shop around to discover where you can find the best prices. Consider other retailers such as dollar stores, larger [employer], local fruit and vegetable stands, and farmers markets. Do not shop when you are hungry. If you shop while hungry, it may be hard to stick to your list and budget. Resist impulse buying. Use your grocery list as your official plan for the week. Buy a variety of vegetables and fruits by purchasing fresh, frozen, and canned items. Look at the top and bottom shelves for deals. Foods at eye level (eye level of an adult or child) are usually more expensive. Be efficient with your time when shopping. The more time you  spend at the store, the more money you are likely to spend. To save money when choosing more expensive foods like meats and dairy: Choose cheaper cuts of meat, such as bone-in chicken thighs and drumsticks instead of skinless and boneless chicken. When you are ready to prepare the chicken, you can remove the skin yourself to make it  healthier. Choose lean meats like chicken or [place] instead of beef. Choose canned seafood, such as tuna, salmon, or sardines. Buy eggs as a low-cost source of protein. Buy dried beans and peas, such as lentils, split peas, or kidney beans instead of meats. Dried beans and peas are a good alternative source of protein. Buy the larger tubs of yogurt instead of individual-sized containers. Choose water instead of sodas and other sweetened beverages. Avoid buying chips, cookies, and other "junk food." These items are usually expensive and not healthy. Cooking Make extra food and freeze the extras in meal-sized containers or in individual portions for fast meals and snacks. Pre-cook on days when you have extra time to prepare meals in advance. You can keep these meals in the fridge or freezer and reheat for a quick meal. When you come home from the grocery store, wash, peel, and cut fruits and vegetables so they are ready to use and eat. This will help reduce food waste. Meal planning Do not eat out or get fast food. Prepare food at home. Make a grocery list and make sure to bring it with you to the store. If you have a smart phone, you could use your phone to create your shopping list. Plan meals and snacks according to a grocery list and budget you create. Use leftovers in your meal plan for the week. Look for recipes where you can cook once and make enough food for two meals. Prepare budget-friendly types of meals like stews, casseroles, and stir-fry dishes. Try some meatless meals or try "no cook" meals like salads. Make sure that half your plate is filled with fruits or vegetables. Choose from fresh, frozen, or canned fruits and vegetables. If eating canned, remember to rinse them before eating. This will remove any excess salt added for packaging. Summary Eating healthy on a budget is possible if you plan your meals according to your budget, purchase according to your budget and grocery list,  and prepare food yourself. Tips for buying more food on a limited budget include buying generic brands, using coupons only for foods you normally buy, and buying healthy items from the bulk bins when available. Tips for buying cheaper food to replace expensive food include choosing cheaper, lean cuts of meat, and buying dried beans and peas. This information is not intended to replace advice given to you by your health care provider. Make sure you discuss any questions you have with your health care provider. Document Revised: 08/20/2020 Document Reviewed: 08/20/2020 Elsevier Patient Education  [place] protect organs, store calcium, anchor muscles, and support the whole body. Keeping your bones strong is important, especially as you get older. You can take actions to help keep your bones strong and healthy. Why is keeping my bones healthy important? Keeping your bones healthy is important because your body constantly replaces bone cells. Cells get old, and new cells take their place. As we age, we lose bone cells because the body may not be able to make enough new cells to replace the old cells. The amount of bone cells and bone tissue you have is referred to as  bone mass. The higher your bone mass, the stronger your bones. The aging process leads to an overall loss of bone mass in the body, which can increase the likelihood of: Broken bones. A condition in which the bones become weak and brittle (osteoporosis). A large decline in bone mass occurs in older adults. In women, it occurs about the time of menopause. What actions can I take to keep my bones healthy? Good health habits are important for maintaining healthy bones. This includes eating nutritious foods and exercising regularly. To have healthy bones, you need to get enough of the right minerals and vitamins. Most nutrition experts recommend getting these nutrients from the foods that you eat. In some cases,  taking supplements may also be recommended. Doing certain types of exercise is also important for bone health. What are the nutritional recommendations for healthy bones? Eating a well-balanced diet with plenty of calcium and vitamin D will help to protect your bones. Nutritional recommendations vary from person to person. Ask your health care provider what is healthy for you. Here are some general guidelines. Get enough calcium Calcium is the most important (essential) mineral for bone health. Most people can get enough calcium from their diet, but supplements may be recommended for people who are at risk for osteoporosis. Good sources of calcium include: Dairy products, such as low-fat or nonfat milk, cheese, and yogurt. Dark green leafy vegetables, such as bok choy and broccoli. Foods that have calcium added to them (are fortified). Foods that may be fortified with calcium include orange juice, cereal, bread, soy beverages, and tofu products. Nuts, such as almonds. Follow these recommended amounts for daily calcium intake: Infants, 0-6 months: 200 mg. Infants, 6-12 months: 260 mg. Children, age 60-3: 700 mg. Children, age 53-8: 1,000 mg. Children, age 75-13: 1,300 mg. Teens, age 54-18: 1,300 mg. Adults, age 74-50: 1,000 mg. Adults, age 602-70: Men: 1,000 mg. Women: 1,200 mg. Adults, age 27 or older: 1,200 mg. Pregnant and breastfeeding females: Teens: 1,300 mg. Adults: 1,000 mg. Get enough vitamin D Vitamin D is the most essential vitamin for bone health. It helps the body absorb calcium. Sunlight stimulates the skin to make vitamin D, so be sure to get enough sunlight. If you live in a cold climate or you do not get outside often, your health care provider may recommend that you take vitamin D supplements. Good sources of vitamin D in your diet include: Egg yolks. Saltwater fish. Milk and cereal fortified with vitamin D. Follow these recommended amounts for daily vitamin D  intake: Infants, 0-12 months: 400 international units (IU). Children and teens, age 60-18: 6 international units. Adults, age 53 or younger: 18 international units. Adults, age 54 or older: 79-1,000 international units. Get other important nutrients Other nutrients that are important for bone health include: Phosphorus. This mineral is found in meat, poultry, dairy foods, nuts, and legumes. The recommended daily intake for adult men and adult women is 700 mg. Magnesium. This mineral is found in seeds, nuts, dark green vegetables, and legumes. The recommended daily intake for adult men is 400-420 mg. For adult women, it is 310-320 mg. Vitamin K. This vitamin is found in green leafy vegetables. The recommended daily intake is 120 mcg for adult men and 90 mcg for adult women. What type of physical activity is best for building and maintaining healthy bones? Weight-bearing and strength-building activities are important for building and maintaining healthy bones. Weight-bearing activities cause muscles and bones to work against gravity. Strength-building activities  increase the strength of the muscles that support bones. Weight-bearing and muscle-building activities include: Walking and hiking. Jogging and running. Dancing. Gym exercises. Lifting weights. Tennis and racquetball. Climbing stairs. Aerobics. Adults should get at least 30 minutes of moderate physical activity on most days. Children should get at least 60 minutes of moderate physical activity on most days. Ask your health care provider what type of exercise is best for you. How can I find out if my bone mass is low? Bone mass can be measured with an X-ray test called a bone mineral density (BMD) test. This test is recommended for all women who are age 1 or older. It may also be recommended for: Men who are age 74 or older. People who are at risk for osteoporosis because of: Having a long-term disease that weakens bones, such as  kidney disease or rheumatoid arthritis. Having menopause earlier than normal. Taking medicine that weakens bones, such as steroids, thyroid hormones, or hormone treatment for breast cancer or prostate cancer. Smoking. Drinking three or more alcoholic drinks a day. Being underweight. Sedentary lifestyle. If you find that you have a low bone mass, you may be able to prevent osteoporosis or further bone loss by changing your diet and lifestyle. Where can I find more information? Bone Health & Osteoporosis Foundation: [employer]Tales.fr [employer] of Health: www.bones.SouthExposed.es International Osteoporosis Foundation: Administrator.iofbonehealth.org Summary The aging process leads to an overall loss of bone mass in the body, which can increase the likelihood of broken bones and osteoporosis. Eating a well-balanced diet with plenty of calcium and vitamin D will help to protect your bones. Weight-bearing and strength-building activities are also important for building and maintaining strong bones. Bone mass can be measured with an X-ray test called a bone mineral density (BMD) test. This information is not intended to replace advice given to you by your health care provider. Make sure you discuss any questions you have with your health care provider. Document Revised: 04/21/2021 Document Reviewed: 04/21/2021 Elsevier Patient Education  2022 [place], Adult After being diagnosed with anxiety, you may be relieved to know why you have felt or behaved a certain way. You may also feel overwhelmed about the treatment ahead and what it will mean for your life. With care and support, you can manage this condition. How to manage lifestyle changes Managing stress and anxiety Stress is your body's reaction to life changes and events, both good and bad. Most stress will last just a few hours, but stress can be ongoing and can lead to more than just stress. Although stress can play a major  role in anxiety, it is not the same as anxiety. Stress is usually caused by something external, such as a deadline, test, or competition. Stress normally passes after the triggering event has ended.  Anxiety is caused by something internal, such as imagining a terrible outcome or worrying that something will go wrong that will devastate you. Anxiety often does not go away even after the triggering event is over, and it can become long-term (chronic) worry. It is important to understand the differences between stress and anxiety and to manage your stress effectively so that it does not lead to an anxious response. Talk with your health care provider or a counselor to learn more about reducing anxiety and stress. He or she may suggest tension reduction techniques, such as: Music therapy. Spend time creating or listening to music that you enjoy and that inspires you. Mindfulness-based meditation. Practice being aware of  your normal breaths while not trying to control your breathing. It can be done while sitting or walking. Centering prayer. This involves focusing on a word, phrase, or sacred image that means something to you and brings you peace. Deep breathing. To do this, expand your stomach and inhale slowly through your nose. Hold your breath for 3-5 seconds. Then exhale slowly, letting your stomach muscles relax. Self-talk. Learn to notice and identify thought patterns that lead to anxiety reactions and change those patterns to thoughts that feel peaceful. Muscle relaxation. Taking time to tense muscles and then relax them. Choose a tension reduction technique that fits your lifestyle and personality. These techniques take time and practice. Set aside 5-15 minutes a day to do them. Therapists can offer counseling and training in these techniques. The training to help with anxiety may be covered by some insurance plans. Other things you can do to manage stress and anxiety include: Keeping a stress diary.  This can help you learn what triggers your reaction and then learn ways to manage your response. Thinking about how you react to certain situations. You may not be able to control everything, but you can control your response. Making time for activities that help you relax and not feeling guilty about spending your time in this way. Doing visual imagery. This involves imagining or creating mental pictures to help you relax. Practicing yoga. Through yoga poses, you can lower tension and promote relaxation.  Medicines Medicines can help ease symptoms. Medicines for anxiety include: Antidepressant medicines. These are usually prescribed for long-term daily control. Anti-anxiety medicines. These may be added in severe cases, especially when panic attacks occur. Medicines will be prescribed by a health care provider. When used together, medicines, psychotherapy, and tension reduction techniques may be the most effective treatment. Relationships Relationships can play a big part in helping you recover. Try to spend more time connecting with trusted friends and family members. Consider going to couples counseling if you have a partner, taking family education classes, or going to family therapy. Therapy can help you and others better understand your condition. How to recognize changes in your anxiety Everyone responds differently to treatment for anxiety. Recovery from anxiety happens when symptoms decrease and stop interfering with your daily activities at home or work. This may mean that you will start to: Have better concentration and focus. Worry will interfere less in your daily thinking. Sleep better. Be less irritable. Have more energy. Have improved memory. It is also important to recognize when your condition is getting worse. Contact your health care provider if your symptoms interfere with home or work and you feel like your condition is not improving. Follow these instructions at  home: Activity Exercise. Adults should do the following: Exercise for at least 150 minutes each week. The exercise should increase your heart rate and make you sweat (moderate-intensity exercise). Strengthening exercises at least twice a week. Get the right amount and quality of sleep. Most adults need 7-9 hours of sleep each night. Lifestyle  Eat a healthy diet that includes plenty of vegetables, fruits, whole grains, low-fat dairy products, and lean protein. Do not eat a lot of foods that are high in fats, added sugars, or salt (sodium). Make choices that simplify your life. Do not use any products that contain nicotine or tobacco. These products include cigarettes, chewing tobacco, and vaping devices, such as e-cigarettes. If you need help quitting, ask your health care provider. Avoid caffeine, alcohol, and certain over-the-counter cold medicines. These may  make you feel worse. Ask your pharmacist which medicines to avoid. General instructions Take over-the-counter and prescription medicines only as told by your health care provider. Keep all follow-up visits. This is important. Where to find support You can get help and support from these sources: Self-help groups. Online and [employer]. A trusted spiritual leader. Couples counseling. Family education classes. Family therapy. Where to find more information You may find that joining a support group helps you deal with your anxiety. The following sources can help you locate counselors or support groups near you: Mental Health America: www.mentalhealthamerica.net Anxiety and Depression Association of [place] (ADAA): ProgramCam.de [employer] on Mental Illness (NAMI): www.nami.org Contact a health care provider if: You have a hard time staying focused or finishing daily tasks. You spend many hours a day feeling worried about everyday life. You become exhausted by worry. You start to have headaches or frequently  feel tense. You develop chronic nausea or diarrhea. Get help right away if: You have a racing heart and shortness of breath. You have thoughts of hurting yourself or others. If you ever feel like you may hurt yourself or others, or have thoughts about taking your own life, get help right away. Go to your nearest emergency department or: Call your local emergency services (911 in the [place] Call a suicide crisis helpline, such as the National Suicide Prevention Lifeline at 281-068-7004 or 988 in the [place] This is open 24 hours a day in the [place] Text the Crisis Text Line at 406-448-4029 (in the [place] Summary Taking steps to learn and use tension reduction techniques can help calm you and help prevent triggering an anxiety reaction. When used together, medicines, psychotherapy, and tension reduction techniques may be the most effective treatment. Family, friends, and partners can play a big part in supporting you. This information is not intended to replace advice given to you by your health care provider. Make sure you discuss any questions you have with your health care provider. Document Revised: 06/02/2021 Document Reviewed: 02/28/2021 Elsevier Patient Education  2022 Elsevier Inc. Dizziness Dizziness is a common problem. It is a feeling of unsteadiness or light-headedness. You may feel like you are about to faint. Dizziness can lead to injury if you stumble or fall. Anyone can become dizzy, but dizziness is more common in older adults. This condition can be caused by a number of things, including medicines, dehydration, or illness. Follow these instructions at home: Eating and drinking  Drink enough fluid to keep your urine pale yellow. This helps to keep you from becoming dehydrated. Try to drink more clear fluids, such as water. Do not drink alcohol. Limit your caffeine intake if told to do so by your health care provider. Check ingredients and nutrition facts to see if a food or beverage  contains caffeine. Limit your salt (sodium) intake if told to do so by your health care provider. Check ingredients and nutrition facts to see if a food or beverage contains sodium. Activity  Avoid making quick movements. Rise slowly from chairs and steady yourself until you feel okay. In the morning, first sit up on the side of the bed. When you feel okay, stand slowly while you hold onto something until you know that your balance is good. If you need to stand in one place for a long time, move your legs often. Tighten and relax the muscles in your legs while you are standing. Do not drive or use machinery if you feel dizzy. Avoid bending down if  you feel dizzy. Place items in your home so that they are easy for you to reach without leaning over. Lifestyle Do not use any products that contain nicotine or tobacco. These products include cigarettes, chewing tobacco, and vaping devices, such as e-cigarettes. If you need help quitting, ask your health care provider. Try to reduce your stress level by using methods such as yoga or meditation. Talk with your health care provider if you need help to manage your stress. General instructions Watch your dizziness for any changes. Take over-the-counter and prescription medicines only as told by your health care provider. Talk with your health care provider if you think that your dizziness is caused by a medicine that you are taking. Tell a friend or a family member that you are feeling dizzy. If he or she notices any changes in your behavior, have this person call your health care provider. Keep all follow-up visits. This is important. Contact a health care provider if: Your dizziness does not go away or you have new symptoms. Your dizziness or light-headedness gets worse. You feel nauseous. You have reduced hearing. You have a fever. You have neck pain or a stiff neck. Your dizziness leads to an injury or a fall. Get help right away if: You vomit  or have diarrhea and are unable to eat or drink anything. You have problems talking, walking, swallowing, or using your arms, hands, or legs. You feel generally weak. You have any bleeding. You are not thinking clearly or you have trouble forming sentences. It may take a friend or family member to notice this. You have chest pain, abdominal pain, shortness of breath, or sweating. Your vision changes or you develop a severe headache. These symptoms may represent a serious problem that is an emergency. Do not wait to see if the symptoms will go away. Get medical help right away. Call your local emergency services (911 in the [place] Do not drive yourself to the hospital. Summary Dizziness is a feeling of unsteadiness or light-headedness. This condition can be caused by a number of things, including medicines, dehydration, or illness. Anyone can become dizzy, but dizziness is more common in older adults. Drink enough fluid to keep your urine pale yellow. Do not drink alcohol. Avoid making quick movements if you feel dizzy. Monitor your dizziness for any changes. This information is not intended to replace advice given to you by your health care provider. Make sure you discuss any questions you have with your health care provider. Document Revised: 10/12/2020 Document Reviewed: 10/12/2020 Elsevier Patient Education  2022 [employer].

## 2021-12-18 LAB — HEP, RPR, HIV PANEL
HIV Screen 4th Generation wRfx: NONREACTIVE
Hepatitis B Surface Ag: NEGATIVE
RPR Ser Ql: NONREACTIVE

## 2021-12-19 LAB — NUSWAB BV AND CANDIDA, NAA
Candida albicans, NAA: NEGATIVE
Candida glabrata, NAA: NEGATIVE

## 2021-12-21 LAB — CYTOLOGY - PAP
Adequacy: ABSENT
Chlamydia: NEGATIVE
Comment: NEGATIVE
Comment: NEGATIVE
Comment: NEGATIVE
Comment: NORMAL
Diagnosis: NEGATIVE
High risk HPV: NEGATIVE
Neisseria Gonorrhea: NEGATIVE
Trichomonas: NEGATIVE

## 2021-12-24 ENCOUNTER — Telehealth: Payer: Self-pay | Admitting: Family Medicine

## 2021-12-24 NOTE — Telephone Encounter (Signed)
.. °  Medicaid Managed Care   Unsuccessful Outreach Note  12/24/2021 Name: Jasmine Gordon MRN: [id number] DOB: 12/19/1985  Referred by: [name], DO Reason for referral : High Risk Managed Medicaid (I called the patient today to get her scheduled with the MM Team. I left my name and number on her VM.)   An unsuccessful telephone outreach was attempted today. The patient was referred to the case management team for assistance with care management and care coordination.   Follow Up Plan: The care management team will reach out to the patient again over the next 14 days.     Weston Settle Care Guide, High Risk Medicaid Managed Care Embedded Care Coordination [facility]   [facility]

## 2021-12-27 ENCOUNTER — Ambulatory Visit: Payer: Self-pay | Admitting: *Deleted

## 2021-12-27 NOTE — Telephone Encounter (Signed)
°  Chief Complaint: dizziness, request appt  Symptoms: dizziness when standing and feels woozy, worse with changing head position. Had migraine yesterday  and now on menstrual cycle Frequency: started feeling x 1 week but worse yesterday at church Pertinent Negatives: Patient denies chest pain , difficulty breathing no fever, no room spinning no N/V now  Disposition: [] ED /[] Urgent Care (no appt availability in office) / [x] Appointment(In office/virtual)/ []  [place] [facility]/ [] Home Care/ [] Refused Recommended Disposition /[] [place] [facility]/ []  Follow-up with PCP Additional Notes:  Appt scheduled 12/28/21.       Reason for Disposition  [1] MILD dizziness (e.g., vertigo; walking normally) AND [2] has NOT been evaluated by physician for this  Answer Assessment - Initial Assessment Questions 1. DESCRIPTION: "Describe your dizziness."     Feels like head is moving but body is not  2. VERTIGO: "Do you feel like either you or the room is spinning or tilting?"      No room spinning  3. LIGHTHEADED: "Do you feel lightheaded?" (e.g., somewhat faint, woozy, weak upon standing)     Woozy ,standing  4. SEVERITY: "How bad is it?"  "Can you walk?"   - MILD: Feels slightly dizzy and unsteady, but is walking normally.   - MODERATE: Feels unsteady when walking, but not falling; interferes with normal activities (e.g., school, work).   - SEVERE: Unable to walk without falling, or requires assistance to walk without falling.     Moderate  5. ONSET:  "When did the dizziness begin?"      A  6. AGGRAVATING FACTORS: "Does anything make it worse?" (e.g., standing, change in head position)     Standing , change in head position  7. CAUSE: "What do you think is causing the dizziness?"     Not sure  8. RECURRENT SYMPTOM: "Have you had dizziness before?" If Yes, ask: "When was the last time?" "What happened that time?"     na 9. OTHER SYMPTOMS: "Do you have any other symptoms?" (e.g., headache,  weakness, numbness, vomiting, earache)     None now only dull headache  10. PREGNANCY: "Is there any chance you are pregnant?" "When was your last menstrual period?"       On menstrual cycle now  Protocols used: Dizziness - Vertigo-A-AH

## 2021-12-28 ENCOUNTER — Other Ambulatory Visit: Payer: Self-pay

## 2021-12-28 ENCOUNTER — Ambulatory Visit: Payer: Medicaid Other | Admitting: Family Medicine

## 2021-12-28 ENCOUNTER — Encounter: Payer: Self-pay | Admitting: Family Medicine

## 2021-12-28 VITALS — BP 130/74 | HR 91 | Ht 67.0 in | Wt 238.2 lb

## 2021-12-28 DIAGNOSIS — F410 Panic disorder [episodic paroxysmal anxiety] without agoraphobia: Secondary | ICD-10-CM | POA: Diagnosis not present

## 2021-12-28 DIAGNOSIS — F4310 Post-traumatic stress disorder, unspecified: Secondary | ICD-10-CM

## 2021-12-28 DIAGNOSIS — J4530 Mild persistent asthma, uncomplicated: Secondary | ICD-10-CM

## 2021-12-28 DIAGNOSIS — F411 Generalized anxiety disorder: Secondary | ICD-10-CM

## 2021-12-28 MED ORDER — SERTRALINE HCL 50 MG PO TABS: 50.0000 mg | ORAL_TABLET | Freq: Every day | ORAL | 1 refills | Status: DC

## 2021-12-28 MED ORDER — ALBUTEROL SULFATE HFA 108 (90 BASE) MCG/ACT IN AERS: 2.0000 | INHALATION_SPRAY | Freq: Four times a day (QID) | RESPIRATORY_TRACT | 3 refills | Status: DC | PRN

## 2021-12-28 NOTE — Patient Instructions (Addendum)
Thank you for coming to the office today.  These offices have both PSYCHIATRY doctors and [place] (Virtual Available) [place] [address] [place], [place] [address] Phone: [phone]  [facility] Address: [address], [place], [place] [address] [email or web address] Phone: [phone]  [place] ([place] [facility]) Address: [address], [place], [place] [address] Hours: 8:30AM-5PM Phone: [phone]  [place],  [place]  [address] Phone: [phone]  [place] [facility] [place], [place] [address] Phone: [phone]  [facility] (All ages) [address] [place], [id number] Phone: [phone] (Option 1) [email or web address]  ----------------------------------------------------------------- THERAPIST ONLY  (No Psychiatry)  [facility] [address], [place] [address] Johnnette Litter P: [phone]  [place], [place]: [place].   Address: [place], [place], [place] [address] Hours: Open today  9AM-7PM Phone: [phone]  [facility] [address], [place] Address: [address] [place], [place],  [address] Phone: [phone]   You have symptoms of Vertigo (Benign Paroxysmal Positional Vertigo) - This is commonly caused by inner ear fluid imbalance, sometimes can be worsened by allergies and sinus symptoms, otherwise it can occur randomly sometimes and we may never discover the exact cause. - To treat this, try the Epley Manuever (see diagrams/instructions below) at home up to 3 times a day for 1-2 weeks or until symptoms resolve  OTC Dramamine as  needed if prefer, can try Meclizine as well we can order.  If you develop significant worsening episode with vertigo that does not improve and you get severe headache, loss of vision, arm or leg weakness, slurred speech, or other concerning symptoms please seek immediate medical attention at Emergency Department.   See the next page for images describing the Epley Manuever.     ----------------------------------------------------------------------------------------------------------------------         Please schedule a Follow-up Appointment to: Return in about 6 weeks (around 02/08/2022) for 6 weeks follow up Anxiety / Vertigo, psych referral status.  If you have any other questions or concerns, please feel free to call the office or send a message through [place]. You may also schedule an earlier appointment if necessary.  Additionally, you may be receiving a survey about your experience at our office within a few days to 1 week by e-mail or mail. We value your feedback.  Nobie Putnam, DO [place]

## 2021-12-28 NOTE — Progress Notes (Signed)
Subjective:    Patient ID: Jasmine Gordon, female    DOB: February 02, 1986, 36 y.o.   MRN: [id number]  Jasmine Gordon is a 36 y.o. female presenting on 12/28/2021 for Anxiety and Dizziness   HPI  Vertigo vs Dizziness Generalized Anxiety Disorder GAD with panic attacks  She has long history of anxiety and on medication >10+ years  She has previously tried Zoloft, Lexapro, Citalopram, Buspar.  Prevoiusly with Mental [facility]  Buspar ineffective for her. Felt side effects.  Improved on Zoloft 50mg  daily  Recent acute onset past 3 days  Describes significant stress that is impacting her physical symptoms and impact. She describes severe issues constantly over past few years with persistent anxiety impacting her.  She works in [employer] for [employer] per day twice per day, and she has limited resources at work to provide meals for her job, she is concerned that she does not have enough support. She admits physically feeling exhausted and pain in joints and feels her anxiety is currently worsening - she describes difficulty with handling / coping with stressors that arise.  She admits significant family stressors.  She admits difficulty with motivation caring for family.  Asks about disability for mental health.  Ex-Husbands father's health is declining and she is trying to help care for him  Currently not established with psychiatry or therapist  Depression screen [facility] 2/9 12/29/2021 12/17/2021 09/07/2021  Decreased Interest 1 1 0  Down, Depressed, Hopeless 1 0 1  PHQ - 2 Score 2 1 1   Altered sleeping 2 0 1  Tired, decreased energy 2 1 1   Change in appetite 1 0 0  Feeling bad or failure about yourself  1 1 1   Trouble concentrating 2 0 0  Moving slowly or fidgety/restless 1 1 0  Suicidal thoughts 0 0 0  PHQ-9 Score 11 4 4   Difficult doing work/chores - Somewhat difficult Somewhat difficult  Some recent data might be hidden   GAD 7 : Generalized Anxiety Score  12/29/2021 12/17/2021 09/07/2021 05/06/2021  Nervous, Anxious, on Edge 3 1 1 1   Control/stop worrying 3 0 0 0  Worry too much - different things 3 1 1 1   Trouble relaxing 2 1 1  0  Restless 1 0 1 1  Easily annoyed or irritable 2 1 1  0  Afraid - awful might happen 2 1 1  0  Total GAD 7 Score 16 5 6 3   Anxiety Difficulty Very difficult Somewhat difficult Not difficult at all Not difficult at all      Social History   Tobacco Use   Smoking status: Never   Smokeless tobacco: Never  Vaping Use   Vaping Use: Never used  Substance Use Topics   Alcohol use: Not Currently    Alcohol/week: 1.0 standard drink    Types: 1 Glasses of wine per week   Drug use: No    Review of Systems Per HPI unless specifically indicated above     Objective:    BP 130/74    Pulse 91    Ht 5\' 7"  (1.702 m)    Wt 238 lb 3.2 oz (108 kg)    SpO2 100%    BMI 37.31 kg/m   Wt Readings from Last 3 Encounters:  12/28/21 238 lb 3.2 oz (108 kg)  12/17/21 238 lb 9.6 oz (108.2 kg)  12/04/21 237 lb (107.5 kg)    Physical Exam Vitals and nursing note reviewed.  Constitutional:      General:  She is not in acute distress.    Appearance: Normal appearance. She is well-developed. She is not diaphoretic.     Comments: Well-appearing, comfortable, cooperative  HENT:     Head: Normocephalic and atraumatic.     Right Ear: Tympanic membrane, ear canal and external ear normal. There is no impacted cerumen.     Left Ear: Tympanic membrane, ear canal and external ear normal. There is no impacted cerumen.     Ears:     Comments: Mild clear effusion bilateral Eyes:     General:        Right eye: No discharge.        Left eye: No discharge.     Conjunctiva/sclera: Conjunctivae normal.  Cardiovascular:     Rate and Rhythm: Normal rate.  Pulmonary:     Effort: Pulmonary effort is normal.  Skin:    General: Skin is warm and dry.     Findings: No erythema or rash.  Neurological:     Mental Status: She is alert and  oriented to person, place, and time.  Psychiatric:        Mood and Affect: Mood normal.        Behavior: Behavior normal.        Thought Content: Thought content normal.     Comments: Well groomed, good eye contact, normal speech and thoughts, anxious and tearful today     Results for orders placed or performed in visit on 12/17/21  HEP, RPR, HIV Panel  Result Value Ref Range   Hepatitis B Surface Ag Negative Negative   RPR Ser Ql Non Reactive Non Reactive   HIV Screen 4th Generation wRfx Non Reactive Non Reactive  Cytology - PAP  Result Value Ref Range   High risk HPV Negative    Neisseria Gonorrhea Negative    Chlamydia Negative    Trichomonas Negative    Adequacy      Satisfactory for evaluation; transformation zone component ABSENT.   Diagnosis      - Negative for intraepithelial lesion or malignancy (NILM)   Comment Normal Reference Range Trichomonas - Negative    Comment Normal Reference Ranger Chlamydia - Negative    Comment      Normal Reference Range Neisseria Gonorrhea - Negative   Comment Normal Reference Range HPV - Negative   NuSwab BV and Candida, NAA  Result Value Ref Range   Atopobium vaginae Low - 0 Score   BVAB 2 Low - 0 Score   Megasphaera 1 Low - 0 Score   Candida albicans, NAA Negative Negative   Candida glabrata, NAA Negative Negative      Assessment & Plan:   Problem List Items Addressed This Visit     PTSD (post-traumatic stress disorder)   Relevant Medications   sertraline (ZOLOFT) 50 MG tablet   Mild persistent asthma without complication   Relevant Medications   albuterol (VENTOLIN HFA) 108 (90 Base) MCG/ACT inhaler   Generalized anxiety disorder with panic attacks - Primary   Relevant Medications   sertraline (ZOLOFT) 50 MG tablet   Other Visit Diagnoses     GAD (generalized anxiety disorder)       Relevant Medications   sertraline (ZOLOFT) 50 MG tablet      GAD w panic PTSD No clear diagnosed major depression, most symptoms  caused by her anxiety as primary source. Multiple major life stressors with work and home impacting her. Chronic problem with cumulative effect. Unsuccessful therapy in past: failed -  Zoloft,  Lexapro, Citalopram, Buspar Agrees to restart the Zoloft had improved the most, will restart 50mg , advised that I believe this is under dosed and she may need higher dose as tolerated to 75 vs 100mg  in future She remains OFF Buspar, did not believe it helped, caution avoid side effects of higher dose.  Emphasis today that based on severity of her anxiety and debilitating nature of her mental health concerns impacting her daily function and work. She will need Psychiatry mental health consultation and therapist management going forward to assist with medication and therapy/CBT  Handout per AVS with list of locations, she can explore options in network/coverage and find a place that she prefers, we can refer once we hear back from her and get her established.  She would need further mental health management to optimize her regimen and also pursue possible Short vs Long Term Disability options if this is indicated based on her mental health conditions.    Suspected R/L-BPPV by history supported by history - No other significant neurological findings or focal deficits Seems worse with her acute anxiety stressors  Plan: 1. Handout given with Epley maneuver TID for 1-2 weeks until resolved 2. Return criteria, if not improved consider vestibular PT referral    Meds ordered this encounter  Medications   albuterol (VENTOLIN HFA) 108 (90 Base) MCG/ACT inhaler    Sig: Inhale 2 puffs into the lungs every 6 (six) hours as needed for wheezing or shortness of breath.    Dispense:  8 g    Refill:  3   sertraline (ZOLOFT) 50 MG tablet    Sig: Take 1 tablet (50 mg total) by mouth daily.    Dispense:  90 tablet    Refill:  1      Follow up plan: Return in about 6 weeks (around 02/08/2022) for 6 weeks follow  up Anxiety / Vertigo, psych referral status.   , DO [facility] 12/28/2021, 1:54 PM

## 2021-12-29 ENCOUNTER — Encounter: Payer: Self-pay | Admitting: Family Medicine

## 2021-12-29 DIAGNOSIS — F410 Panic disorder [episodic paroxysmal anxiety] without agoraphobia: Secondary | ICD-10-CM

## 2021-12-29 DIAGNOSIS — F4310 Post-traumatic stress disorder, unspecified: Secondary | ICD-10-CM

## 2021-12-29 DIAGNOSIS — F411 Generalized anxiety disorder: Secondary | ICD-10-CM

## 2022-01-04 ENCOUNTER — Emergency Department
Admission: EM | Admit: 2022-01-04 | Discharge: 2022-01-04 | Disposition: A | Discharge status: Home / Self Care | Payer: Medicaid Other | Attending: Emergency Medicine | Admitting: Emergency Medicine

## 2022-01-04 ENCOUNTER — Other Ambulatory Visit: Payer: Self-pay

## 2022-01-04 DIAGNOSIS — R209 Unspecified disturbances of skin sensation: Secondary | ICD-10-CM | POA: Diagnosis not present

## 2022-01-04 DIAGNOSIS — F419 Anxiety disorder, unspecified: Secondary | ICD-10-CM | POA: Insufficient documentation

## 2022-01-04 DIAGNOSIS — R42 Dizziness and giddiness: Secondary | ICD-10-CM

## 2022-01-04 DIAGNOSIS — J45909 Unspecified asthma, uncomplicated: Secondary | ICD-10-CM | POA: Insufficient documentation

## 2022-01-04 DIAGNOSIS — R9431 Abnormal electrocardiogram [ECG] [EKG]: Secondary | ICD-10-CM | POA: Diagnosis not present

## 2022-01-04 DIAGNOSIS — R0789 Other chest pain: Secondary | ICD-10-CM | POA: Diagnosis not present

## 2022-01-04 LAB — CBC
HCT: 36.9 % (ref 36.0–46.0)
Hemoglobin: 11.4 g/dL — ABNORMAL LOW (ref 12.0–15.0)
MCH: 26 pg (ref 26.0–34.0)
MCHC: 30.9 g/dL (ref 30.0–36.0)
MCV: 84.2 fL (ref 80.0–100.0)
Platelets: 233 10*3/uL (ref 150–400)
RBC: 4.38 MIL/uL (ref 3.87–5.11)
RDW: 13.2 % (ref 11.5–15.5)
WBC: 4 10*3/uL (ref 4.0–10.5)
nRBC: 0 % (ref 0.0–0.2)

## 2022-01-04 LAB — URINALYSIS, ROUTINE W REFLEX MICROSCOPIC
Bilirubin Urine: NEGATIVE
Glucose, UA: NEGATIVE mg/dL
Ketones, ur: 5 mg/dL — AB
Leukocytes,Ua: NEGATIVE
Nitrite: NEGATIVE
Protein, ur: NEGATIVE mg/dL
Specific Gravity, Urine: 1.018 (ref 1.005–1.030)
pH: 5 (ref 5.0–8.0)

## 2022-01-04 LAB — BASIC METABOLIC PANEL
Anion gap: 4 — ABNORMAL LOW (ref 5–15)
BUN: 9 mg/dL (ref 6–20)
CO2: 24 mmol/L (ref 22–32)
Calcium: 9.3 mg/dL (ref 8.9–10.3)
Chloride: 108 mmol/L (ref 98–111)
Creatinine, Ser: 0.73 mg/dL (ref 0.44–1.00)
GFR, Estimated: >60 mL/min (ref >60)
Glucose, Bld: 96 mg/dL (ref 70–99)
Potassium: 3.7 mmol/L (ref 3.5–5.1)
Sodium: 136 mmol/L (ref 135–145)

## 2022-01-04 LAB — POC URINE PREG, ED: Preg Test, Ur: NEGATIVE

## 2022-01-04 LAB — TSH: TSH: 1.396 u[IU]/mL (ref 0.350–4.500)

## 2022-01-04 NOTE — ED Provider Notes (Signed)
[facility] Provider Note    Event Date/Time   First MD Initiated Contact with Patient 01/04/22 1201     (approximate)   History   Dizziness   HPI Jasmine Gordon is a 36 y.o. female with stated past medical history of anxiety/depression, asthma, PTSD who presents for dizziness and chest pain that occurred approximately 2 hours prior to arrival.  Patient states that she had similar symptoms in the past that she believed were panic attacks due to severe anxiety.  Patient states that she has been dealing with anxiety over the past few years after jobs and child stresses.  Patient describes these episodes as chest tightness, dizziness, occasional paresthesias in the hands, and occasional perioral numbness with tachypnea.     Physical Exam   Triage Vital Signs: ED Triage Vitals  Enc Vitals Group     BP 01/04/22 1140 132/88     Pulse Rate 01/04/22 1140 85     Resp 01/04/22 1140 17     Temp 01/04/22 1140 98.7 F (37.1 C)     Temp Source 01/04/22 1140 Oral     SpO2 01/04/22 1140 100 %     Weight 01/04/22 1141 230 lb (104.3 kg)     Height 01/04/22 1141 5\' 7"  (1.702 m)     Head Circumference --      Peak Flow --      Pain Score 01/04/22 1141 0     Pain Loc --      Pain Edu? --      Excl. in [place]? --     Most recent vital signs: Vitals:   01/04/22 1140  BP: 132/88  Pulse: 85  Resp: 17  Temp: 98.7 F (37.1 C)  SpO2: 100%    General: Awake, oriented x4. CV:  Good peripheral perfusion.  Resp:  Normal effort.  Abd:  No distention.  Other:  Middle-aged African-American female laying in bed in no distress   ED Results / Procedures / Treatments   Labs (all labs ordered are listed, but only abnormal results are displayed) Labs Reviewed  BASIC METABOLIC PANEL - Abnormal; Notable for the following components:      Result Value   Anion gap 4 (*)    All other components within normal limits  CBC - Abnormal; Notable for the following  components:   Hemoglobin 11.4 (*)    All other components within normal limits  URINALYSIS, ROUTINE W REFLEX MICROSCOPIC - Abnormal; Notable for the following components:   Color, Urine YELLOW (*)    APPearance CLEAR (*)    Hgb urine dipstick SMALL (*)    Ketones, ur 5 (*)    Bacteria, UA RARE (*)    All other components within normal limits  TSH  POC URINE PREG, ED  CBG MONITORING, ED     EKG ED ECG REPORT I, Naaman Plummer, the attending physician, personally viewed and interpreted this ECG.  Date: 01/04/2022 EKG Time: 1144 Rate: 83 Rhythm: normal sinus rhythm QRS Axis: normal Intervals: normal ST/T Wave abnormalities: normal Narrative Interpretation: no evidence of acute ischemia  PROCEDURES:  Critical Care performed: No  .1-3 Lead EKG Interpretation Performed by: Naaman Plummer, MD Authorized by: Naaman Plummer, MD     Interpretation: normal     ECG rate:  87   ECG rate assessment: normal     Rhythm: sinus rhythm     Ectopy: none     Conduction: normal  MEDICATIONS ORDERED IN ED: Medications - No data to display   IMPRESSION / MDM / [place] / ED COURSE  I reviewed the triage vital signs and the nursing notes.                             The patient is on the cardiac monitor to evaluate for evidence of arrhythmia and/or significant heart rate changes. Workup: ECG, CXR, CBC, BMP, Troponin Findings: ECG: No overt evidence of STEMI. No evidence of Brugadas sign, delta wave, epsilon wave, significantly prolonged QTc, or malignant arrhythmia HS Troponin: Negative x1 Other Labs unremarkable for emergent problems. CXR: Without PTX, PNA, or widened mediastinum Last Stress Test: Never Last Heart Catheterization: Never HEART Score: 2  Given History, Exam, and Workup I have low suspicion for ACS, Pneumothorax, Pneumonia, Pulmonary Embolus, Tamponade, Aortic Dissection or other emergent problem as a cause for this presentation.   Reassesment:  Prior to discharge patients pain was controlled and they were well appearing.  Disposition:  Discharge. Strict return precautions discussed with patient with full understanding. Advised patient to follow up promptly with primary care provider         FINAL CLINICAL IMPRESSION(S) / ED DIAGNOSES   Final diagnoses:  Dizziness  Lightheadedness  Anxiety     Rx / DC Orders   ED Discharge Orders     None        Note:  This document was prepared using Dragon voice recognition software and may include unintentional dictation errors.   Naaman Plummer, MD 01/04/22 [phone]

## 2022-01-04 NOTE — ED Triage Notes (Signed)
Pt states she was driving to pick up her child from school and became dizzy, "Jittery" "like I was getting too much air,. Pt is in NAD

## 2022-01-04 NOTE — ED Notes (Signed)
First Nurse Note:  Pt to ED via dizziness. Pt is in NAD.

## 2022-01-05 ENCOUNTER — Telehealth: Payer: Self-pay

## 2022-01-05 NOTE — Telephone Encounter (Signed)
Transition Care Management Follow-up Telephone Call Date of discharge and from where: 01/04/2022 from [facility] How have you been since you were released from the hospital? Patient stated that she is feeling better and did not have any questions or concerns.  Any questions or concerns? No  Items Reviewed: Did the pt receive and understand the discharge instructions provided? Yes  Medications obtained and verified? Yes  Other? No  Any new allergies since your discharge? No  Dietary orders reviewed? No Do you have support at home? Yes   Functional Questionnaire: (I = Independent and D = Dependent) ADLs: I  Bathing/Dressing- I  Meal Prep- I  Eating- I  Maintaining continence- I  Transferring/Ambulation- I  Managing Meds- I   Follow up appointments reviewed:  PCP Hospital f/u appt confirmed? No   Specialist Hospital f/u appt confirmed? No   Are transportation arrangements needed? No  If their condition worsens, is the pt aware to call PCP or go to the Emergency Dept.? Yes Was the patient provided with contact information for the PCP's office or ED? Yes Was to pt encouraged to call back with questions or concerns? Yes

## 2022-01-20 ENCOUNTER — Other Ambulatory Visit: Payer: Self-pay

## 2022-01-20 ENCOUNTER — Encounter: Payer: Self-pay | Admitting: Emergency Medicine

## 2022-01-20 DIAGNOSIS — R079 Chest pain, unspecified: Secondary | ICD-10-CM | POA: Diagnosis not present

## 2022-01-20 DIAGNOSIS — F410 Panic disorder [episodic paroxysmal anxiety] without agoraphobia: Secondary | ICD-10-CM | POA: Diagnosis not present

## 2022-01-20 DIAGNOSIS — R0789 Other chest pain: Secondary | ICD-10-CM | POA: Diagnosis not present

## 2022-01-20 DIAGNOSIS — F419 Anxiety disorder, unspecified: Secondary | ICD-10-CM | POA: Diagnosis not present

## 2022-01-20 DIAGNOSIS — J45909 Unspecified asthma, uncomplicated: Secondary | ICD-10-CM | POA: Diagnosis not present

## 2022-01-20 LAB — CBC
HCT: 35.7 % — ABNORMAL LOW (ref 36.0–46.0)
Hemoglobin: 11.2 g/dL — ABNORMAL LOW (ref 12.0–15.0)
MCH: 26 pg (ref 26.0–34.0)
MCHC: 31.4 g/dL (ref 30.0–36.0)
MCV: 83 fL (ref 80.0–100.0)
Platelets: 247 10*3/uL (ref 150–400)
RBC: 4.3 MIL/uL (ref 3.87–5.11)
RDW: 13.3 % (ref 11.5–15.5)
WBC: 5.8 10*3/uL (ref 4.0–10.5)
nRBC: 0 % (ref 0.0–0.2)

## 2022-01-20 LAB — URINALYSIS, ROUTINE W REFLEX MICROSCOPIC
Bilirubin Urine: NEGATIVE
Glucose, UA: NEGATIVE mg/dL
Ketones, ur: NEGATIVE mg/dL
Nitrite: NEGATIVE
Protein, ur: NEGATIVE mg/dL
Specific Gravity, Urine: 1.02 (ref 1.005–1.030)
pH: 5 (ref 5.0–8.0)

## 2022-01-20 LAB — BASIC METABOLIC PANEL
Anion gap: 9 (ref 5–15)
BUN: 12 mg/dL (ref 6–20)
CO2: 22 mmol/L (ref 22–32)
Calcium: 9.1 mg/dL (ref 8.9–10.3)
Chloride: 107 mmol/L (ref 98–111)
Creatinine, Ser: 0.77 mg/dL (ref 0.44–1.00)
GFR, Estimated: >60 mL/min (ref >60)
Glucose, Bld: 100 mg/dL — ABNORMAL HIGH (ref 70–99)
Potassium: 3.4 mmol/L — ABNORMAL LOW (ref 3.5–5.1)
Sodium: 138 mmol/L (ref 135–145)

## 2022-01-20 LAB — TROPONIN I (HIGH SENSITIVITY)
Troponin I (High Sensitivity): <2 ng/L (ref <18)
Troponin I (High Sensitivity): <2 ng/L (ref <18)

## 2022-01-20 LAB — POC URINE PREG, ED: Preg Test, Ur: NEGATIVE

## 2022-01-20 NOTE — ED Triage Notes (Signed)
Pt in via POV, states, "Im scared I have never felt like this before.  Reports headache, light headedness, intermittent left arm pain x one day.  States, "I was trying to just stay at home but we have heart issues in my family."  Ambulatory to triage, vitals WDL.   ?

## 2022-01-21 ENCOUNTER — Ambulatory Visit (INDEPENDENT_AMBULATORY_CARE_PROVIDER_SITE_OTHER): Payer: Medicaid Other

## 2022-01-21 ENCOUNTER — Emergency Department
Admission: EM | Admit: 2022-01-21 | Discharge: 2022-01-21 | Disposition: A | Discharge status: Home / Self Care | Payer: Medicaid Other | Attending: Emergency Medicine | Admitting: Emergency Medicine

## 2022-01-21 ENCOUNTER — Encounter: Payer: Self-pay | Admitting: Internal Medicine

## 2022-01-21 ENCOUNTER — Emergency Department: Payer: Medicaid Other

## 2022-01-21 ENCOUNTER — Ambulatory Visit: Payer: Medicaid Other | Admitting: Internal Medicine

## 2022-01-21 ENCOUNTER — Encounter: Payer: Self-pay | Admitting: Radiology

## 2022-01-21 VITALS — BP 110/80 | HR 86 | Ht 67.0 in | Wt 234.5 lb

## 2022-01-21 DIAGNOSIS — R002 Palpitations: Secondary | ICD-10-CM

## 2022-01-21 DIAGNOSIS — R42 Dizziness and giddiness: Secondary | ICD-10-CM

## 2022-01-21 DIAGNOSIS — R0602 Shortness of breath: Secondary | ICD-10-CM | POA: Insufficient documentation

## 2022-01-21 DIAGNOSIS — R079 Chest pain, unspecified: Secondary | ICD-10-CM | POA: Insufficient documentation

## 2022-01-21 DIAGNOSIS — F410 Panic disorder [episodic paroxysmal anxiety] without agoraphobia: Secondary | ICD-10-CM

## 2022-01-21 MED ORDER — ASPIRIN 81 MG PO CHEW
324.0000 mg | CHEWABLE_TABLET | Freq: Once | ORAL | Status: AC
  Administered 2022-01-21: 324 mg via ORAL
  Filled 2022-01-21: qty 4

## 2022-01-21 NOTE — ED Provider Notes (Signed)
[facility] Provider Note    Event Date/Time   First MD Initiated Contact with Patient 01/21/22 0036     (approximate)   History   Multiple Complaints   HPI  Jasmine Gordon is a 36 y.o. female with a history of anxiety, iron deficiency anemia, asthma, obesity, panic attacks, PTSD who presents for evaluation of chest pain.  Patient reports that she has been under a lot of stress.  She was laying in bed trying to go to sleep when she started feeling some heaviness in the center of her chest, she had difficulty breathing.  She felt like her heart was racing and she felt like she was going to pass out.  She has had several similar episodes in the past and has been seen in the ED for the same.  She reports that usually when she is able to calm herself down the symptoms resolved.  She is concerned because she has family history of heart disease in her dad.  She is not a smoker, denies any prior history of heart disease.  No personal family history of PE or DVT, no recent travel immobilization, no leg pain or swelling, no hemoptysis or exogenous hormones.     Past Medical History:  Diagnosis Date   Allergy    Anemia    Anxiety    Asthma    Migraine    Obese    Panic attack    PTSD (post-traumatic stress disorder) 2016    Past Surgical History:  Procedure Laterality Date   APPENDECTOMY     LEEP  12/2020   CIN II, negative margins   Plantar warts       Physical Exam   Triage Vital Signs: ED Triage Vitals  Enc Vitals Group     BP 01/20/22 1936 (!) 144/90     Pulse Rate 01/20/22 1936 (!) 105     Resp 01/20/22 1936 17     Temp 01/20/22 1936 98.9 F (37.2 C)     Temp Source 01/20/22 1936 Oral     SpO2 01/20/22 1936 100 %     Weight 01/20/22 1938 235 lb (106.6 kg)     Height 01/20/22 1938 5\' 7"  (1.702 m)     Head Circumference --      Peak Flow --      Pain Score 01/20/22 1938 5     Pain Loc --      Pain Edu? --      Excl. in [place]? --      Most recent vital signs: Vitals:   01/20/22 2302 01/21/22 0129  BP: (!) 121/93 121/63  Pulse: 73 78  Resp: 18 20  Temp:  98.8 F (37.1 C)  SpO2: 100% 100%     Constitutional: Alert and oriented.  Patient looks anxious but in no distress. HEENT:      Head: Normocephalic and atraumatic.         Eyes: Conjunctivae are normal. Sclera is non-icteric.       Mouth/Throat: Mucous membranes are moist.       Neck: Supple with no signs of meningismus. Cardiovascular: Regular rate and rhythm. No murmurs, gallops, or rubs. 2+ symmetrical distal pulses are present in all extremities.  Respiratory: Normal respiratory effort. Lungs are clear to auscultation bilaterally.  Gastrointestinal: Soft, non tender, and non distended with positive bowel sounds. No rebound or guarding. Genitourinary: No CVA tenderness. Musculoskeletal:  No edema, cyanosis, or erythema of extremities. Neurologic: Normal speech  and language. Face is symmetric. Moving all extremities. No gross focal neurologic deficits are appreciated. Skin: Skin is warm, dry and intact. No rash noted. Psychiatric: Mood and affect are normal. Speech and behavior are normal.  ED Results / Procedures / Treatments   Labs (all labs ordered are listed, but only abnormal results are displayed) Labs Reviewed  BASIC METABOLIC PANEL - Abnormal; Notable for the following components:      Result Value   Potassium 3.4 (*)    Glucose, Bld 100 (*)    All other components within normal limits  CBC - Abnormal; Notable for the following components:   Hemoglobin 11.2 (*)    HCT 35.7 (*)    All other components within normal limits  URINALYSIS, ROUTINE W REFLEX MICROSCOPIC - Abnormal; Notable for the following components:   Color, Urine YELLOW (*)    APPearance HAZY (*)    Hgb urine dipstick MODERATE (*)    Leukocytes,Ua MODERATE (*)    Bacteria, UA FEW (*)    All other components within normal limits  POC URINE PREG, ED  TROPONIN I (HIGH  SENSITIVITY)  TROPONIN I (HIGH SENSITIVITY)     EKG  ED ECG REPORT I, Rudene Re, the attending physician, personally viewed and interpreted this ECG.  Normal sinus rhythm with a rate of 93, normal intervals, normal axis, T wave inversions in inferior leads with no ST elevations or depressions.  Unchanged from prior  RADIOLOGY I, Rudene Re, attending MD, have personally viewed and interpreted the images obtained during this visit as below:  Chest x-ray with no acute disease   ___________________________________________________ Interpretation by Radiologist:  DG Chest 2 View  Result Date: 01/21/2022 CLINICAL DATA:  Left arm pain EXAM: CHEST - 2 VIEW COMPARISON:  None. FINDINGS: The heart size and mediastinal contours are within normal limits. Both lungs are clear. The visualized skeletal structures are unremarkable. IMPRESSION: No active cardiopulmonary disease. Electronically Signed   By: Fidela Salisbury M.D.   On: 01/21/2022 01:04      PROCEDURES:  Critical Care performed: No  Procedures    IMPRESSION / MDM / ASSESSMENT AND PLAN / ED COURSE  I reviewed the triage vital signs and the nursing notes.  36 y.o. female with a history of anxiety, iron deficiency anemia, asthma, obesity, panic attacks, PTSD who presents for evaluation of chest tightness and difficulty breathing.  She reports that her symptoms have resolved.  She has been under a lot of stress.  She looks slightly anxious on exam but otherwise well-appearing, vitals are within normal limits.  Patient has been seen several times in the past few months for similar symptoms.  She is very concerned when she gets chest pain because her father has a history of heart attacks  Ddx: Anxiety versus panic attack versus ACS versus worsening anemia versus asthma exacerbation versus dysrhythmia.  Doubt PE with no tachypnea, tachycardia, hypoxia, and fully resolved symptoms.   Plan: EKG, troponin x2, cardiac telemetry  monitoring for any signs of dysrhythmias, CBC, metabolic panel, pregnancy test, chest x-ray.  We will give an aspirin.   MEDICATIONS GIVEN IN ED: Medications  aspirin chewable tablet 324 mg (324 mg Oral Given 01/21/22 0142)     ED COURSE: Patient was monitored on telemetry with no signs of dysrhythmias.  Telemetry strip was reviewed by me.  Chest x-ray with no acute findings.  EKG and 2 high-sensitivity troponins were negative.  Patient has mild stable anemia with hemoglobin of 11.2.  No signs  of sepsis, no significant electrolyte derangements or AKI.  I did discuss with patient that the symptoms could be attributed to stress and anxiety attacks.  She has been seen by her primary care doctor and is supposed to start Zoloft but has not started yet.  Recommended starting at a soon as possible.  Due to her several recent visits to the ER for the same complaints, the fact the patient is obese, and has family history of heart disease I will refer her to cardiology for further evaluation.  Recommended taking baby aspirin every day until she is seen by cardiology.  Admission was considered without unnecessary with a negative work-up and symptoms that have fully resolved.  Discussed my standard return precautions with patient.  Patient is in agreement with plan.   Consults: None   EMR reviewed including last visit with her PCP from a month ago for anxiety disorder and panic attacks    FINAL CLINICAL IMPRESSION(S) / ED DIAGNOSES   Final diagnoses:  Chest pain, unspecified type  Anxiety attack     Rx / DC Orders   ED Discharge Orders          Ordered    Ambulatory referral to Cardiology       Comments: Chest pain, palpitations, dizziness   01/21/22 0149             Note:  This document was prepared using Dragon voice recognition software and may include unintentional dictation errors.   Please note:  Patient was evaluated in Emergency Department today for the symptoms described in  the history of present illness. Patient was evaluated in the context of the global COVID-19 pandemic, which necessitated consideration that the patient might be at risk for infection with the SARS-CoV-2 virus that causes COVID-19. Institutional protocols and algorithms that pertain to the evaluation of patients at risk for COVID-19 are in a state of rapid change based on information released by regulatory bodies including the CDC and federal and state organizations. These policies and algorithms were followed during the patient's care in the ED.  Some ED evaluations and interventions may be delayed as a result of limited staffing during the pandemic.       Alfred Levins, [place], MD 01/21/22 [phone]

## 2022-01-21 NOTE — Progress Notes (Signed)
? ?New Outpatient Visit ?Date: 01/21/2022 ? ?Referring Provider: ?Rudene Re, MD ?[facility] Emergency Department ? ?Chief Complaint: Palpitations, chest pain and shortness of breath ? ?HPI:  [name] is a 36 y.o. female who is being seen today for the evaluation of chest pain at the request of Dr. Alfred Levins. She has a history of hyperlipidemia, iron deficiency anemia, asthma, obesity, panic attacks, and PTSD.  She presented to the [facility] emergency department yesterday evening after developing chest pain, shortness of breath, palpitations, and presyncope.  She has been seen in the ED several times for similar symptoms.  Work-up was unremarkable other than mild hypokalemia (K3.4) and mild anemia (Hgb 11.2).  High-sensitivity troponin I was <2 x2.  EKG showed normal sinus rhythm with nonspecific T wave changes and borderline prolonged QT. ? ?[name] reports that she has had anxiety and panic attacks for many years that they had been under better control up until she started working again about a month ago.  There was a lot of stress at her work prompting her to have frequent episodes of palpitations chest discomfort and shortness of breath.  These episodes have persisted despite quitting work 2 weeks ago.  She initially feels a funny feeling in her legs as if they are becoming weak, followed by sudden acceleration in her heart rate, shortness of breath, and a vague discomfort across her chest.  She also feels like things are closing in around her.  She tries to calm herself down, with the episodes usually abating after 10-15 minutes.  There are no obvious triggers.  She notes mild exertional dyspnea when moving about but does not have any chest pain or palpitations at other times.  She has never passed out.  She notes that her half sister was recently diagnosed with heart failure.  She is scheduled to see hematology for follow-up of her iron deficiency  anemia. ? ?-------------------------------------------------------------------------------------------------- ? ?Cardiovascular History & Procedures: ?Cardiovascular Problems: ?Chest pain and shortness of breath ?Palpitations ? ?Risk Factors: ?Hyperlipidemia and obesity ? ?Cath/PCI: ?None ? ?CV Surgery: ?None ? ?EP Procedures and Devices: ?None ? ?Non-Invasive Evaluation(s): ?None ? ?Recent CV Pertinent Labs: ?Lab Results  ?Component Value Date  ? CHOL 224 (H) 09/07/2021  ? HDL 42 (L) 09/07/2021  ? LDLCALC 164 (H) 09/07/2021  ? TRIG 77 09/07/2021  ? CHOLHDL 5.3 (H) 09/07/2021  ? BNP 11.0 12/09/2018  ? K 3.4 (L) 01/20/2022  ? K 3.5 09/17/2014  ? BUN 12 01/20/2022  ? BUN 11 09/17/2014  ? CREATININE 0.77 01/20/2022  ? CREATININE 0.73 11/04/2020  ? ? ?-------------------------------------------------------------------------------------------------- ? ?Past Medical History:  ?Diagnosis Date  ? Allergy   ? Anemia   ? Anxiety   ? Asthma   ? Migraine   ? Obese   ? Panic attack   ? PTSD (post-traumatic stress disorder) 2016  ? ? ?Past Surgical History:  ?Procedure Laterality Date  ? APPENDECTOMY    ? LEEP  12/2020  ? CIN II, negative margins  ? Plantar warts    ? ? ?Current Meds  ?Medication Sig  ? albuterol (VENTOLIN HFA) 108 (90 Base) MCG/ACT inhaler Inhale 2 puffs into the lungs every 6 (six) hours as needed for wheezing or shortness of breath.  ? cetirizine (ZYRTEC ALLERGY) 10 MG tablet Take 1 tablet (10 mg total) by mouth daily.  ? chlorhexidine (PERIDEX) 0.12 % solution Use as directed 10 mLs in the mouth or throat.  ? fluticasone (FLONASE) 50 MCG/ACT nasal spray Place 1 spray  into both nostrils daily. Use for 4-6 weeks then stop and use seasonally or as needed.  ? hydrOXYzine (ATARAX) 10 MG tablet Take 1 tablet (10 mg total) by mouth 3 (three) times daily as needed for anxiety.  ? montelukast (SINGULAIR) 10 MG tablet Take 1 tablet (10 mg total) by mouth at bedtime.  ? nystatin (MYCOSTATIN/NYSTOP) powder Apply 1  application topically 3 (three) times daily.  ? sertraline (ZOLOFT) 50 MG tablet Take 1 tablet (50 mg total) by mouth daily.  ? triamcinolone cream (KENALOG) 0.5 % Apply 1 application topically 2 (two) times daily. To affected areas, for up to 2 weeks.  ? vitamin B-12 (CYANOCOBALAMIN) 1000 MCG tablet Take 1 tablet (1,000 mcg total) by mouth daily.  ? Vitamin D, Ergocalciferol, (DRISDOL) 1.25 MG (50000 UNIT) CAPS capsule Take 1 capsule (50,000 Units total) by mouth every 7 (seven) days.  ? ? ?Allergies: Reglan [metoclopramide] ? ?Social History  ? ?Tobacco Use  ? Smoking status: Never  ? Smokeless tobacco: Never  ?Vaping Use  ? Vaping Use: Never used  ?Substance Use Topics  ? Alcohol use: Not Currently  ? Drug use: No  ? ? ?Family History  ?Problem Relation Age of Onset  ? Diabetes Mother   ? Depression Mother   ? Mental retardation Mother   ? Bipolar disorder Mother   ? Heart disease Father   ? Diabetes Father   ? Heart failure Sister   ? Stroke Maternal Grandfather   ? Diabetes Maternal Grandfather   ? ? ?Review of Systems: ?A 12-system review of systems was performed and was negative except as noted in the HPI. ? ?-------------------------------------------------------------------------------------------------- ? ?Physical Exam: ?BP 110/80 (BP Location: Right Arm, Patient Position: Sitting, Cuff Size: Normal)   Pulse 86   Ht 5\' 7"  (1.702 m)   Wt 234 lb 8 oz (106.4 kg)   LMP 12/24/2021 (Approximate)   SpO2 98%   BMI 36.73 kg/m?  ?Position Blood pressure (mmHg) Heart rate (bpm)  ?Lying 108/75 86  ?Sitting 110/76 102  ?Standing 129/91 91  ?Standing (3 minutes) 118/82 103  ? ?General: NAD. ?HEENT: No conjunctival pallor or scleral icterus. Facemask in place. ?Neck: Supple without lymphadenopathy, thyromegaly, JVD, or HJR. No carotid bruit. ?Lungs: Normal work of breathing. Clear to auscultation bilaterally without wheezes or crackles. ?Heart: Regular rate and rhythm without murmurs, rubs, or gallops.  Non-displaced PMI. ?Abd: Bowel sounds present. Soft, NT/ND without hepatosplenomegaly ?Ext: No lower extremity edema. Radial, PT, and DP pulses are 2+ bilaterally ?Skin: Warm and dry without rash. ?Neuro: CNIII-XII intact. Strength and fine-touch sensation intact in upper and lower extremities bilaterally. ?Psych: Normal mood and affect. ? ?EKG: Normal sinus rhythm without abnormality. ? ?Lab Results  ?Component Value Date  ? WBC 5.8 01/20/2022  ? HGB 11.2 (L) 01/20/2022  ? HCT 35.7 (L) 01/20/2022  ? MCV 83.0 01/20/2022  ? PLT 247 01/20/2022  ? ? ?Lab Results  ?Component Value Date  ? NA 138 01/20/2022  ? K 3.4 (L) 01/20/2022  ? CL 107 01/20/2022  ? CO2 22 01/20/2022  ? BUN 12 01/20/2022  ? CREATININE 0.77 01/20/2022  ? GLUCOSE 100 (H) 01/20/2022  ? ALT 9 12/04/2021  ? ? ?Lab Results  ?Component Value Date  ? CHOL 224 (H) 09/07/2021  ? HDL 42 (L) 09/07/2021  ? LDLCALC 164 (H) 09/07/2021  ? TRIG 77 09/07/2021  ? CHOLHDL 5.3 (H) 09/07/2021  ? ?-------------------------------------------------------------------------------------------------- ? ?ASSESSMENT AND PLAN: ?Palpitations, shortness of breath, and chest pain: ?Symptoms  are most likely related to anxiety/panic attacks.  Multiple ED evaluations have been unrevealing.  Physical examination and EKG today are also normal.  We discussed the potential for paroxysmal arrhythmias to cause similar symptoms.  I am also somewhat worried about her family history of heart failure and her half-sister.  We have agreed to obtain a 14-day event monitor and transthoracic echocardiogram.  If the studies are unremarkable, I think it would be reasonable to defer further evaluation and continue to work on optimizing [name]'s anxiety. ? ?Follow-up: Return to clinic in 6 weeks. ? ?Nelva Bush, MD ?01/21/2022 ?12:44 PM ? ?

## 2022-01-21 NOTE — Patient Instructions (Signed)
Medication Instructions:  ? ?Your physician recommends that you continue on your current medications as directed. Please refer to the Current Medication list given to you today. ? ?*If you need a refill on your cardiac medications before your next appointment, please call your pharmacy* ? ? ?Lab Work: ? ?None ordered ? ?Testing/Procedures: ? ?1) Your physician has requested that you have an echocardiogram AFTER wearing event monitor for TWO WEEKS. Echocardiography is a painless test that uses sound waves to create images of your heart. It provides your doctor with information about the size and shape of your heart and how well your heart?s chambers and valves are working. This procedure takes approximately one hour. There are no restrictions for this procedure. ? ?2) Your physician has recommended that you wear a Zio XT monitor for TWO WEEKS. ?    This will be mailed to you.   ? ?This monitor is a medical device that records the heart?s electrical activity. Doctors most often use these monitors to diagnose arrhythmias. Arrhythmias are problems with the speed or rhythm of the heartbeat. The monitor is a small device applied to your chest. You can wear one while you do your normal daily activities. While wearing this monitor if you have any symptoms to push the button and record what you felt. Once you have worn this monitor for the period of time provider prescribed (Usually 14 days), you will return the monitor device in the postage paid box. Once it is returned they will download the data collected and provide [place] with a report which the provider will then review and we will call you with those results. Important tips: ? ?Avoid showering during the first 24 hours of wearing the monitor. ?Avoid excessive sweating to help maximize wear time. ?Do not submerge the device, no hot tubs, and no swimming pools. ?Keep any lotions or oils away from the patch. ?After 24 hours you may shower with the patch on. Take brief  showers with your back facing the shower head.  ?Do not remove patch once it has been placed because that will interrupt data and decrease adhesive wear time. ?Push the button when you have any symptoms and write down what you were feeling. ?Once you have completed wearing your monitor, remove and place into box which has postage paid and place in your outgoing mailbox.  ?If for some reason you have misplaced your box then call our office and we can provide another box and/or mail it off for you. ? ? ?Follow-Up: ?At [facility], you and your health needs are our priority.  As part of our continuing mission to provide you with exceptional heart care, we have created designated Provider Care Teams.  These Care Teams include your primary Cardiologist (physician) and Advanced Practice Providers (APPs -  Physician Assistants and Nurse Practitioners) who all work together to provide you with the care you need, when you need it. ? ?We recommend signing up for the patient portal called "MyChart".  Sign up information is provided on this After Visit Summary.  MyChart is used to connect with patients for Virtual Visits (Telemedicine).  Patients are able to view lab/test results, encounter notes, upcoming appointments, etc.  Non-urgent messages can be sent to your provider as well.   ?To learn more about what you can do with MyChart, go to [email or web address].   ? ?Your next appointment:   ?6 week(s) ? ?The format for your next appointment:   ?In Person ? ?Provider:   ?You may  see Dr. Cristal Deer End or one of the following Advanced Practice Providers on your designated Care Team:   ?Nicolasa Ducking, NP ?Eula Listen, PA-C ?Cadence Fransico Michael, PA-C ?

## 2022-01-24 ENCOUNTER — Telehealth: Payer: Self-pay

## 2022-01-24 DIAGNOSIS — R42 Dizziness and giddiness: Secondary | ICD-10-CM | POA: Diagnosis not present

## 2022-01-24 DIAGNOSIS — R002 Palpitations: Secondary | ICD-10-CM

## 2022-01-24 DIAGNOSIS — R0602 Shortness of breath: Secondary | ICD-10-CM | POA: Diagnosis not present

## 2022-01-24 NOTE — Telephone Encounter (Signed)
Transition Care Management Follow-up Telephone Call ?Date of discharge and from where: 01/21/2022 from [facility] ?How have you been since you were released from the hospital? Patient stated that she is feeling better and did not have any questions or concerns.  ?Any questions or concerns? No ? ?Items Reviewed: ?Did the pt receive and understand the discharge instructions provided? Yes  ?Medications obtained and verified? Yes  ?Other? No  ?Any new allergies since your discharge? No  ?Dietary orders reviewed? No ?Do you have support at home? Yes  ? ?Functional Questionnaire: (I = Independent and D = Dependent) ?ADLs: I ? ?Bathing/Dressing- I ? ?Meal Prep- I ? ?Eating- I ? ?Maintaining continence- I ? ?Transferring/Ambulation- I ? ?Managing Meds- I ? ? ?Follow up appointments reviewed: ? ?PCP Hospital f/u appt confirmed? No   ?Specialist Hospital f/u appt confirmed? Yes  Scheduled to see Neysa Hotter, MD on 02/04/2022 @ 8:00am. ?Are transportation arrangements needed? No  ?If their condition worsens, is the pt aware to call PCP or go to the Emergency Dept.? Yes ?Was the patient provided with contact information for the PCP's office or ED? Yes ?Was to pt encouraged to call back with questions or concerns? Yes ? ?
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
## 2022-02-04 ENCOUNTER — Ambulatory Visit (INDEPENDENT_AMBULATORY_CARE_PROVIDER_SITE_OTHER): Payer: Medicaid Other | Admitting: Psychiatry

## 2022-02-04 ENCOUNTER — Encounter: Payer: Self-pay | Admitting: Psychiatry

## 2022-02-04 ENCOUNTER — Other Ambulatory Visit: Payer: Self-pay

## 2022-02-04 DIAGNOSIS — F4310 Post-traumatic stress disorder, unspecified: Secondary | ICD-10-CM

## 2022-02-04 DIAGNOSIS — F411 Generalized anxiety disorder: Secondary | ICD-10-CM

## 2022-02-04 DIAGNOSIS — F410 Panic disorder [episodic paroxysmal anxiety] without agoraphobia: Secondary | ICD-10-CM

## 2022-02-04 DIAGNOSIS — F3342 Major depressive disorder, recurrent, in full remission: Secondary | ICD-10-CM | POA: Diagnosis not present

## 2022-02-04 NOTE — Patient Instructions (Signed)
Start sertraline 25 mg at night for two weeks, then 50 mg at night  ?Start hydroxyzine 25 mg daily as needed for anxiety  ?Referral to therapy  ?Next appointment: 5/8 at 3:30 , in person ? ?The next visit will be in person visit. Please arrive 15 mins before the scheduled time.  ? ?[facility]  ?Address: [address], [place], [place] [address]   ?

## 2022-02-06 ENCOUNTER — Other Ambulatory Visit: Payer: Self-pay

## 2022-02-06 ENCOUNTER — Emergency Department
Admission: EM | Admit: 2022-02-06 | Discharge: 2022-02-06 | Disposition: A | Discharge status: Home / Self Care | Payer: Medicaid Other | Attending: Emergency Medicine | Admitting: Emergency Medicine

## 2022-02-06 ENCOUNTER — Encounter: Payer: Self-pay | Admitting: Emergency Medicine

## 2022-02-06 DIAGNOSIS — R000 Tachycardia, unspecified: Secondary | ICD-10-CM | POA: Insufficient documentation

## 2022-02-06 DIAGNOSIS — R1012 Left upper quadrant pain: Secondary | ICD-10-CM | POA: Insufficient documentation

## 2022-02-06 DIAGNOSIS — R42 Dizziness and giddiness: Secondary | ICD-10-CM | POA: Insufficient documentation

## 2022-02-06 DIAGNOSIS — R1032 Left lower quadrant pain: Secondary | ICD-10-CM | POA: Insufficient documentation

## 2022-02-06 LAB — URINALYSIS, ROUTINE W REFLEX MICROSCOPIC
Bilirubin Urine: NEGATIVE
Glucose, UA: NEGATIVE mg/dL
Ketones, ur: NEGATIVE mg/dL
Leukocytes,Ua: NEGATIVE
Nitrite: NEGATIVE
Protein, ur: NEGATIVE mg/dL
Specific Gravity, Urine: 1.018 (ref 1.005–1.030)
pH: 6 (ref 5.0–8.0)

## 2022-02-06 LAB — PREGNANCY, URINE: Preg Test, Ur: NEGATIVE

## 2022-02-06 LAB — CBC
HCT: 37.9 % (ref 36.0–46.0)
Hemoglobin: 11.8 g/dL — ABNORMAL LOW (ref 12.0–15.0)
MCH: 25.9 pg — ABNORMAL LOW (ref 26.0–34.0)
MCHC: 31.1 g/dL (ref 30.0–36.0)
MCV: 83.3 fL (ref 80.0–100.0)
Platelets: 229 10*3/uL (ref 150–400)
RBC: 4.55 MIL/uL (ref 3.87–5.11)
RDW: 13.1 % (ref 11.5–15.5)
WBC: 5.1 10*3/uL (ref 4.0–10.5)
nRBC: 0 % (ref 0.0–0.2)

## 2022-02-06 LAB — COMPREHENSIVE METABOLIC PANEL
ALT: 10 U/L (ref 0–44)
AST: 16 U/L (ref 15–41)
Albumin: 4 g/dL (ref 3.5–5.0)
Alkaline Phosphatase: 46 U/L (ref 38–126)
Anion gap: 7 (ref 5–15)
BUN: 9 mg/dL (ref 6–20)
CO2: 25 mmol/L (ref 22–32)
Calcium: 9.2 mg/dL (ref 8.9–10.3)
Chloride: 107 mmol/L (ref 98–111)
Creatinine, Ser: 0.7 mg/dL (ref 0.44–1.00)
GFR, Estimated: >60 mL/min (ref >60)
Glucose, Bld: 110 mg/dL — ABNORMAL HIGH (ref 70–99)
Potassium: 3.5 mmol/L (ref 3.5–5.1)
Sodium: 139 mmol/L (ref 135–145)
Total Bilirubin: 0.2 mg/dL — ABNORMAL LOW (ref 0.3–1.2)
Total Protein: 7.7 g/dL (ref 6.5–8.1)

## 2022-02-06 LAB — D-DIMER, QUANTITATIVE: D-Dimer, Quant: 0.4 ug/mL-FEU (ref 0.00–0.50)

## 2022-02-06 LAB — LIPASE, BLOOD: Lipase: 30 U/L (ref 11–51)

## 2022-02-06 LAB — TROPONIN I (HIGH SENSITIVITY): Troponin I (High Sensitivity): <2 ng/L (ref <18)

## 2022-02-06 NOTE — ED Notes (Signed)
First encounter to D/C pt. D/C and reasons to return to ED discussed with pt, pt verbalized understanding. Pt ambulatory on D/C with steady gait. NAD noted. VSS ?

## 2022-02-06 NOTE — ED Provider Notes (Signed)
? ?[facility] ?Provider Note ? ? ? Event Date/Time  ? First MD Initiated Contact with Patient 02/06/22 [phone]   ?  (approximate) ? ? ?History  ? ?Abdominal Pain and Dizziness ? ? ?HPI ? ?Jasmine Gordon is a 36 y.o. female who presents to the ED for evaluation of Abdominal Pain and Dizziness ?  ?I review outpatient behavioral visit from 3/17.  History of obesity, PTSD, anxiety and MDD ?I reviewed cardiac consult as an outpatient from 3/3 due to palpitations and atypical chest pains with multiple ED visits.  Likely due to anxiety.  She has a Zio patch on right now. ? ?Patient returns to the ED for evaluation of an episode of dizziness and left-sided abdominal pain.  She reports left upper and left lower quadrant abdominal pain "for a while now."  She is apologetic and does not know if it is due to anxiety or something is wrong.  She reports tolerating p.o. intake and toileting at her baseline without fever, emesis, diarrhea, dysuria or new vaginal discharge/bleeding.  Reports occasionally feeling short of breath that she attributes to anxiety, denies any cough or substernal chest pain.  Denies syncopal episodes, fever. ? ? ?Physical Exam  ? ?Triage Vital Signs: ?ED Triage Vitals  ?Enc Vitals Group  ?   BP 02/06/22 0328 121/79  ?   Pulse Rate 02/06/22 0328 (!) 104  ?   Resp 02/06/22 0328 16  ?   Temp 02/06/22 0328 98.6 ?F (37 ?C)  ?   Temp src --   ?   SpO2 02/06/22 0328 100 %  ?   Weight 02/06/22 0327 234 lb 8 oz (106.4 kg)  ?   Height 02/06/22 0327 5\' 7"  (1.702 m)  ?   Head Circumference --   ?   Peak Flow --   ?   Pain Score 02/06/22 0328 6  ?   Pain Loc --   ?   Pain Edu? --   ?   Excl. in GC? --   ? ? ?Most recent vital signs: ?Vitals:  ? 02/06/22 0328 02/06/22 0626  ?BP: 121/79 106/63  ?Pulse: (!) 104 91  ?Resp: 16 20  ?Temp: 98.6 ?F (37 ?C) 98.7 ?F (37.1 ?C)  ?SpO2: 100% 100%  ? ? ?General: Awake, no distress.  Obese.  Pleasant and conversational. ?CV:  Good peripheral perfusion.   Tachycardic and regular. ?Resp:  Normal effort.  CTAB ?Abd:  No distention.  Soft and benign throughout.  No tenderness, guarding or peritoneal features. ?MSK:  No deformity noted.  ?Neuro:  No focal deficits appreciated. ?Other:   ? ? ?ED Results / Procedures / Treatments  ? ?Labs ?(all labs ordered are listed, but only abnormal results are displayed) ?Labs Reviewed  ?COMPREHENSIVE METABOLIC PANEL - Abnormal; Notable for the following components:  ?    Result Value  ? Glucose, Bld 110 (*)   ? Total Bilirubin 0.2 (*)   ? All other components within normal limits  ?CBC - Abnormal; Notable for the following components:  ? Hemoglobin 11.8 (*)   ? MCH 25.9 (*)   ? All other components within normal limits  ?URINALYSIS, ROUTINE W REFLEX MICROSCOPIC - Abnormal; Notable for the following components:  ? Color, Urine YELLOW (*)   ? APPearance HAZY (*)   ? Hgb urine dipstick SMALL (*)   ? Bacteria, UA MANY (*)   ? All other components within normal limits  ?LIPASE, BLOOD  ?PREGNANCY, URINE  ?D-DIMER,  QUANTITATIVE  ?TROPONIN I (HIGH SENSITIVITY)  ? ? ?EKG ?Sinus tachycardia with a rate of 107 bpm.  Normal axis and intervals.  No STEMI.  Nonspecific ST changes laterally to lead V2. ? ?RADIOLOGY ? ? ?Official radiology report(s): ?No results found. ? ?PROCEDURES and INTERVENTIONS: ? ?.1-3 Lead EKG Interpretation ?Performed by: Delton Prairie, MD ?Authorized by: Delton Prairie, MD  ? ?  Interpretation: abnormal   ?  ECG rate:  102 ?  ECG rate assessment: tachycardic   ?  Rhythm: sinus tachycardia   ?  Ectopy: none   ?  Conduction: normal   ? ?Medications - No data to display ? ? ?IMPRESSION / MDM / ASSESSMENT AND PLAN / ED COURSE  ?I reviewed the triage vital signs and the nursing notes. ? ?36yo female presents to the ED with dizziness and left sided abdominal pain. She is initially tachycardic, resolving without intervention, and vitals are otherwise normal on room air. Looks systemically well on exam. Benign abdominal exam. Blood  work with normal CBC, CMP and lipase. EKG is nonischemic with nonspecific T wave changes, and her trop is negative. D-Dimer is pending at the time of signout to evaluate for the possibility of PE causing her symptoms. If this is negative, I think she'll be suitable for outpatient management as she is now asymptomatic and has a benign workup.  ? ?  ? ? ?FINAL CLINICAL IMPRESSION(S) / ED DIAGNOSES  ? ?Final diagnoses:  ?Dizziness  ?Left upper quadrant abdominal pain  ? ? ? ?Rx / DC Orders  ? ?ED Discharge Orders   ? ? None  ? ?  ? ? ? ?Note:  This document was prepared using Dragon voice recognition software and may include unintentional dictation errors. ?  ?Delton Prairie, MD ?02/06/22 [phone] ? ?

## 2022-02-06 NOTE — ED Triage Notes (Signed)
Pt arrived via POV with reports of LUQ abd pain and LLQ pain, pt states when she was laying down she felt like everything was pushing up towards her chest, pt also reports nausea.  Pt also reports last normal BM yesterday. Pt also reports heart racing. Pt wearing continuous heart monitor. ? ?Pt c/o lightheadedness as well.  ?

## 2022-02-07 ENCOUNTER — Telehealth: Payer: Self-pay

## 2022-02-07 ENCOUNTER — Encounter: Payer: Self-pay | Admitting: Family Medicine

## 2022-02-07 ENCOUNTER — Encounter: Payer: Self-pay | Admitting: Internal Medicine

## 2022-02-07 DIAGNOSIS — F4310 Post-traumatic stress disorder, unspecified: Secondary | ICD-10-CM

## 2022-02-07 DIAGNOSIS — F410 Panic disorder [episodic paroxysmal anxiety] without agoraphobia: Secondary | ICD-10-CM

## 2022-02-07 NOTE — Telephone Encounter (Signed)
Transition Care Management Follow-up Telephone Call ?Date of discharge and from where: 02/06/2022-[facility] ?How have you been since you were released from the hospital? Pt state she is nervous about having an anxiety attack but she will start taking Zoloft.  ?Any questions or concerns? No ? ?Items Reviewed: ?Did the pt receive and understand the discharge instructions provided? Yes  ?Medications obtained and verified?  No new medications given at discharge.  ?Other? No  ?Any new allergies since your discharge? No  ?Dietary orders reviewed? No ?Do you have support at home? Yes  ? ?Home Care and Equipment/Supplies: ?Were home health services ordered? not applicable ?If so, what is the name of the agency? N/A  ?Has the agency set up a time to come to the patient's home? not applicable ?Were any new equipment or medical supplies ordered?  No ?What is the name of the medical supply agency? N/A ?Were you able to get the supplies/equipment? not applicable ?Do you have any questions related to the use of the equipment or supplies? No ? ?Functional Questionnaire: (I = Independent and D = Dependent) ?ADLs: I ? ?Bathing/Dressing- I ? ?Meal Prep- I ? ?Eating- I ? ?Maintaining continence- I ? ?Transferring/Ambulation- I ? ?Managing Meds- I ? ?Follow up appointments reviewed: ? ?PCP Hospital f/u appt confirmed? No   ?Specialist Hospital f/u appt confirmed? No   ?Are transportation arrangements needed? No  ?If their condition worsens, is the pt aware to call PCP or go to the Emergency Dept.? Yes ?Was the patient provided with contact information for the PCP's office or ED? Yes ?Was to pt encouraged to call back with questions or concerns? Yes  ?

## 2022-02-08 ENCOUNTER — Encounter: Payer: Self-pay | Admitting: Family Medicine

## 2022-02-08 ENCOUNTER — Encounter: Payer: Self-pay | Admitting: Internal Medicine

## 2022-02-08 MED ORDER — HYDROXYZINE HCL 10 MG PO TABS: 10.0000 mg | ORAL_TABLET | Freq: Three times a day (TID) | ORAL | 3 refills | Status: AC | PRN

## 2022-02-08 NOTE — Telephone Encounter (Signed)
Called pt d/t nature of message with reported HR 130-140.  ?Pt states that she took an Ashwaganda gummie at 7 AM this morning and has actually noted her HR has finally been lower than 100.  ?Pt reports HR in the 80s today and was pleased that HR lower than it has been lately.  ?Pt denies shortness of breath, chest pain or any other symptoms at this time.  ?Discussed parameters and when to seek emergency attention.  ?Pt has echo scheduled later this week and event monitor results are pending return of monitor.  ?Pt stable at this time and has no further questions.  ?

## 2022-02-09 ENCOUNTER — Telehealth: Payer: Self-pay

## 2022-02-09 NOTE — Telephone Encounter (Signed)
Pt calling wants to get hormone testing done.  [phone] ?

## 2022-02-10 ENCOUNTER — Telehealth: Payer: Self-pay

## 2022-02-10 NOTE — Patient Outreach (Signed)
Care Coordination ? ?02/10/2022 ? ?Jasmine Gordon ?1986-01-29 ?[id number] ? ?BSW was able to briefly speak with patient. She stated she is doing well. She has made appointments to speak with a therapist and psychirist regarding her anxiety. She has also started GOLI gummies which she states are helping a lot. Patient stated she was at her child's school and asked for a telephone call back later. ? ?Mickel Fuchs, BSW, MHA ?[place]  ?High Risk Managed Medicaid Team  ?[phone]  ?

## 2022-02-10 NOTE — Patient Instructions (Signed)
Visit Information ? ?[name] was given information about Medicaid Managed Care team care coordination services as a part of their Healthy [facility] Medicaid benefit. Jasmine Gordon did not consent to engagement with the [facility] Managed Care team.  ? ?If you are experiencing a medical emergency, please call 911 or report to your local emergency department or urgent care.  ? ?If you have a non-emergency medical problem during routine business hours, please contact your provider's office and ask to speak with a nurse.  ? ?For questions related to your Healthy [facility] health plan, please call: [phone] or visit the homepage here: [email or web address] ? ?If you would like to schedule transportation through your Healthy [facility] plan, please call the following number at least 2 days in advance of your appointment: [phone] ? For information about your ride after you set it up, call Ride Assist at [phone]. Use this number to activate a Will Call pickup, or if your transportation is late for a scheduled pickup. Use this number, too, if you need to make a change or cancel a previously scheduled reservation. ? If you need transportation services right away, call [phone]. The after-hours call center is staffed 24 hours to handle ride assistance and urgent reservation requests (including discharges) 365 days a year. Urgent trips include sick visits, hospital discharge requests and life-sustaining treatment. ? ?Call the [facility] Line at [phone], at any time, 24 hours a day, 7 days a week. If you are in danger or need immediate medical attention call 911. ? ?If you would like help to quit smoking, call 1-800-QUIT-NOW (281-677-4745) OR Espa?ol: 1-855-D?jelo-Ya (779)016-1828) o para m?s informaci?n haga clic aqu? or Text READY to 200-400 to register via text ? ?[name] following are the goals we discussed in your visit  today:  ? Goals Addressed   ?None ?  ? ? ?Social Worker will follow up with patient in 1 day.  ? ?[name], BSW, MHA ?[facility]  ?High Risk Managed Medicaid Team  ?[phone]  ? ?Following is a copy of your plan of care:  ?There are no care plans that you recently modified to display for this patient. ? ?  ?

## 2022-02-10 NOTE — Telephone Encounter (Signed)
I would recommend her scheduling a visit with one of the new GYN doctors so she can discuss her hormone concerns

## 2022-02-10 NOTE — Telephone Encounter (Signed)
Patient is scheduled for 02/14/22 with CRS

## 2022-02-11 ENCOUNTER — Ambulatory Visit (INDEPENDENT_AMBULATORY_CARE_PROVIDER_SITE_OTHER): Payer: Medicaid Other

## 2022-02-11 ENCOUNTER — Encounter: Payer: Self-pay | Admitting: Internal Medicine

## 2022-02-11 ENCOUNTER — Other Ambulatory Visit: Payer: Self-pay

## 2022-02-11 DIAGNOSIS — R079 Chest pain, unspecified: Secondary | ICD-10-CM

## 2022-02-11 DIAGNOSIS — R002 Palpitations: Secondary | ICD-10-CM

## 2022-02-11 DIAGNOSIS — R0602 Shortness of breath: Secondary | ICD-10-CM | POA: Diagnosis not present

## 2022-02-11 LAB — ECHOCARDIOGRAM COMPLETE
AR max vel: 2.27 cm2
AV Area VTI: 2.48 cm2
AV Area mean vel: 2.2 cm2
AV Mean grad: 2 mmHg
AV Peak grad: 4.2 mmHg
Ao pk vel: 1.03 m/s
Area-P 1/2: 4.36 cm2
Calc EF: 61.1 %
S' Lateral: 3.5 cm
Single Plane A2C EF: 57.8 %
Single Plane A4C EF: 65.1 %

## 2022-02-11 NOTE — Patient Outreach (Signed)
Care Coordination ? ?02/11/2022 ? ?Jasmine Gordon ?10-19-1986 ?[id number] ? ?BSW completed telephone outreach with patient. She did agree to MM services. Patient has an appointment with LCSW on 02/14/22. ? ? ?[name], BSW, [place] ?[facility]  ?High Risk Managed Medicaid Team  ?[phone]  ?

## 2022-02-11 NOTE — Patient Instructions (Signed)
Visit Information  [name] was given information about Medicaid Managed Care team care coordination services as a part of their [employer] Medicaid benefit. Jasmine Gordon verbally consented to engagement with the Medicaid Managed Care team.   If you are experiencing a medical emergency, please call 911 or report to your local emergency department or urgent care.   If you have a non-emergency medical problem during routine business hours, please contact your provider's office and ask to speak with a nurse.   For questions related to your Healthy [employer] Medicaid health plan, please call: [phone] or visit the homepage here: [email or web address]  If you would like to schedule transportation through your [employer] Medicaid plan, please call the following number at least 2 days in advance of your appointment: [phone]  For information about your ride after you set it up, call Ride Assist at [phone]. Use this number to activate a Will Call pickup, or if your transportation is late for a scheduled pickup. Use this number, too, if you need to make a change or cancel a previously scheduled reservation.  If you need transportation services right away, call [phone]. The after-hours call center is staffed 24 hours to handle ride assistance and urgent reservation requests (including discharges) 365 days a year. Urgent trips include sick visits, hospital discharge requests and life-sustaining treatment.  Call the Behavioral Health Crisis Line at [phone], at any time, 24 hours a day, 7 days a week. If you are in danger or need immediate medical attention call 911.  If you would like help to quit smoking, call 1-800-QUIT-NOW (1-800-784-8669) OR Espaol: 1-855-Djelo-Ya (1-855-335-3569) o para ms informacin haga clic aqu or Text READY to 200-400 to register via text  [name] following are the goals we discussed in your visit  today:   Goals Addressed   None      Social Worker will follow up in 30 days .   Jasmine Gordon, BSW, MHA [facility]  [place]  High Risk Managed Medicaid Team  [phone]   Following is a copy of your plan of care:  There are no care plans that you recently modified to display for this patient.    

## 2022-02-14 ENCOUNTER — Other Ambulatory Visit: Payer: Self-pay | Admitting: Licensed Clinical Social Worker

## 2022-02-14 ENCOUNTER — Ambulatory Visit: Payer: Medicaid Other | Admitting: Obstetrics and Gynecology

## 2022-02-14 DIAGNOSIS — F419 Anxiety disorder, unspecified: Secondary | ICD-10-CM

## 2022-02-14 NOTE — Patient Instructions (Signed)
Visit Information ? ?Ms. Georgann Bramble was given information about Medicaid Managed Care team care coordination services as a part of their Healthy [facility] Medicaid benefit. Nozomi Tayia Stonesifer verbally consented to engagement with the [facility] Managed Care team.  ? ?If you are experiencing a medical emergency, please call 911 or report to your local emergency department or urgent care.  ? ?If you have a non-emergency medical problem during routine business hours, please contact your provider's office and ask to speak with a nurse.  ? ?For questions related to your Healthy [facility] health plan, please call: [phone] or visit the homepage here: [email or web address] ? ?If you would like to schedule transportation through your Healthy [facility] plan, please call the following number at least 2 days in advance of your appointment: [phone] ? For information about your ride after you set it up, call Ride Assist at [phone]. Use this number to activate a Will Call pickup, or if your transportation is late for a scheduled pickup. Use this number, too, if you need to make a change or cancel a previously scheduled reservation. ? If you need transportation services right away, call [phone]. The after-hours call center is staffed 24 hours to handle ride assistance and urgent reservation requests (including discharges) 365 days a year. Urgent trips include sick visits, hospital discharge requests and life-sustaining treatment. ? ?Call the [facility] Line at [phone], at any time, 24 hours a day, 7 days a week. If you are in danger or need immediate medical attention call 911. ? ?If you would like help to quit smoking, call 1-800-QUIT-NOW (3176904142) OR Espa?ol: 1-855-D?jelo-Ya (916)254-9785) o para m?s informaci?n haga clic aqu? or Text READY to 200-400 to register via text ? ?Following is a copy of your plan of care:  ?Care Plan : LCSW Plan of  Care  ?Updates made by Gustavus Bryant, LCSW since 02/14/2022 12:00 AM  ?  ? ?Problem: Anxiety Identification (Anxiety)   ?  ? ?Long-Range Goal: To find a long term counselor in order to learn how to alleviate my anxiety and stress   ?Start Date: 02/14/2022  ?Priority: High  ?Note:   ?Priority: High ? ?Timeframe:  Long-Range Goal ?Priority:  High ?Start Date:   02/14/22                ?Expected End Date:  ongoing                   ?  ?Follow Up Date--03/14/22 ? ?- check out counseling ?- keep 90 percent of counseling appointments ?- schedule counseling appointment  ?  ?Why is this important?   ?          Beating depression may take some time.  ?          If you don't feel better right away, don't give up on your treatment plan.  ? ? Current barriers:   ?Chronic Mental Health needs related to anxiety, panic and stress management  ?Mental Health Concerns  and Social Isolation ?Needs Support, Education, and Care Coordination in order to meet unmet mental health needs. ? ?Clinical Goal(s): verbalize understanding of plan for management of Anxiety, Panic and Stress  ?   ?Patient Goals/Self-Care Activities: Over the next 120 days ?Attend scheduled medical appointments ?Utilize healthy coping skills and supportive resources discussed ?Contact PCP with any questions or concerns ? ?Dickie La, BSW, MSW, LCSW ?Managed Medicaid LCSW ?[place]  [facility] ?[email or web address] ?Phone: [phone] ? ?  ? ?  ?

## 2022-02-14 NOTE — Patient Outreach (Signed)
?Medicaid Managed Care ?Social Work Note ? ?02/14/2022 ?Name:  Jasmine Gordon MRN:  [id number] DOB:  [birth year] ? ?Jasmine Gordon is an 36 y.o. year old female who is a primary patient of Jasmine Gordon.  [facility] was consulted for assistance with:  Mental Health Counseling and Resources ? ?Jasmine Gordon was given information about Medicaid Managed Care Coordination team services today. Jasmine Gordon Patient agreed to services and verbal consent obtained. ? ?Engaged with patient  for by telephone forinitial visit in response to referral for case management and/or care coordination services.  ? ?Assessments/Interventions:  Review of past medical history, allergies, medications, health status, including review of consultants reports, laboratory and other test data, was performed as part of comprehensive evaluation and provision of chronic care management services. ? ?SDOH: (Social Determinant of Health) assessments and interventions performed: ?SDOH Interventions   ? ?Flowsheet Row Most Recent Value  ?SDOH Interventions   ?Depression Interventions/Treatment  Currently on Treatment, Counseling  ? ?  ? ? ?Advanced Directives Status:  Not addressed in this encounter. ? ?Care Plan ?                ?Allergies  ?Allergen Reactions  ? Reglan [Metoclopramide] Hives and Shortness Of Breath  ? Contrave [Naltrexone-Bupropion Hcl Er] Other (See Comments)  ?  Had some reaction to the medication  ? ? ?Medications Reviewed Today   ? ? Reviewed by Jasmine HotterHisada, Reina, Gordon (Psychiatrist) on 02/04/22 at [phone]  Med List Status: <None>  ? ?Medication Order Taking? Sig Documenting Provider Last Dose Status Informant  ?albuterol (VENTOLIN HFA) 108 (90 Base) MCG/ACT inhaler [id number] No Inhale 2 puffs into the lungs every 6 (six) hours as needed for wheezing or shortness of breath. Jasmine Gordon Taking Active   ?cetirizine (ZYRTEC ALLERGY) 10 MG tablet [id number] No  Take 1 tablet (10 mg total) by mouth daily. Jasmine Gordon Taking Active   ?chlorhexidine (PERIDEX) 0.12 % solution [id number] No Use as directed 10 mLs in the mouth or throat. Provider, Historical, Gordon Taking Active   ?fluticasone (FLONASE) 50 MCG/ACT nasal spray [id number] No Place 1 spray into both nostrils daily. Use for 4-6 weeks then stop and use seasonally or as needed. Jasmine Gordon Taking Active   ?gabapentin (NEURONTIN) 100 MG capsule [id number] No Take 1-3 capsules (100-300 mg total) by mouth at bedtime as needed (nerve pain). May increase dose as needed.  ?Patient not taking: Reported on 01/21/2022  ? Jasmine Gordon Not Taking Active   ?hydrOXYzine (ATARAX) 10 MG tablet [id number] No Take 1 tablet (10 mg total) by mouth 3 (three) times daily as needed for anxiety. Jasmine Gordon Taking Active   ?montelukast (SINGULAIR) 10 MG tablet [id number] No Take 1 tablet (10 mg total) by mouth at bedtime. Jasmine Gordon Taking Active   ?nystatin (MYCOSTATIN/NYSTOP) powder [id number] No Apply 1 application topically 3 (three) times daily. Jasmine Gordon Taking Active   ?sertraline (ZOLOFT) 50 MG tablet [id number] No Take 1 tablet (50 mg total) by mouth daily. Jasmine Gordon Taking Active   ?triamcinolone cream (KENALOG) 0.5 % [id number] No Apply 1 application topically 2 (two) times daily. To affected areas, for up to 2 weeks. Jasmine Gordon Taking Active   ?vitamin B-12 (CYANOCOBALAMIN) 1000 MCG tablet [id number] No Take 1 tablet (1,000 mcg total) by mouth daily. Jasmine Gordon Taking Active   ?Vitamin D,  Ergocalciferol, (DRISDOL) 1.25 MG (50000 UNIT) CAPS capsule [id number] No Take 1 capsule (50,000 Units total) by mouth every 7 (seven) days. Jasmine Cords, Gordon Taking Active   ? ?  ?  ? ?  ? ? ?Patient Active Problem List  ? Diagnosis Date Noted  ? Palpitations 01/21/2022  ? Shortness of breath 01/21/2022  ? Chest pain  01/21/2022  ? Generalized anxiety disorder with panic attacks 09/27/2021  ? Environmental and seasonal allergies 11/04/2020  ? Genital warts 09/11/2018  ? Positive TB test 09/25/2017  ? Allergic contact dermatitis due to metals 05/17/2017  ? Pre-diabetes 03/06/2017  ? Hyperlipidemia 03/06/2017  ? Allergic reaction 02/08/2017  ? Urticaria due to food allergy 02/08/2017  ? Vitamin D deficiency 11/08/2016  ? Iron deficiency anemia due to chronic blood loss 10/03/2016  ? Menorrhagia with regular cycle 06/27/2016  ? Mild persistent asthma without complication 06/17/2016  ? Anxiety and depression 06/17/2016  ? Morbid obesity (HCC) 06/17/2016  ? PTSD (post-traumatic stress disorder) 2016  ? ? ?Conditions to be addressed/monitored per PCP order:  Anxiety ? ?Care Plan : LCSW Plan of Care  ?Updates made by Gustavus Bryant, LCSW since 02/14/2022 12:00 AM  ?  ? ?Problem: Anxiety Identification (Anxiety)   ?  ? ?Long-Range Goal: To find a long term counselor in order to learn how to alleviate my anxiety and stress   ?Start Date: 02/14/2022  ?Priority: High  ?Note:   ?Priority: High ? ?Timeframe:  Long-Range Goal ?Priority:  High ?Start Date:   02/14/22                ?Expected End Date:  ongoing                   ?  ?Follow Up Date--03/14/22 ? ?- check out counseling ?- keep 90 percent of counseling appointments ?- schedule counseling appointment  ?  ?Why is this important?   ?          Beating depression may take some time.  ?          If you don't feel better right away, don't give up on your treatment plan.  ? ? Current barriers:   ?Chronic Mental Health needs related to anxiety, panic and stress management  ?Mental Health Concerns  and Social Isolation ?Needs Support, Education, and Care Coordination in order to meet unmet mental health needs. ? ?Clinical Goal(s): verbalize understanding of plan for management of Anxiety, Panic and Stress  ?   ?Clinical Interventions:  ?Assessed patient's previous and current treatment, coping  skills, support system and barriers to care. Patient and spouse provided hx  ?Verbalization of feelings encouraged, motivational interviewing employed ?Emotional support provided, positive coping strategies explored ?Self care/establishing healthy boundaries emphasized ?Patient reports that her anxiety has increased which has affected her ability to carry out work and function daily.  ?Patient receives strong support from spouse ?Patient is agreeable to referral to [facility] for counseling. Patient has already started psychiatry treatment there. [facility] LCSW made referral on 02/14/22. ?Patient recently stopped her last job because it was negatively affected her mental health. She has started working as a Lawyer.  ?Patient reports that she experiences both anxiety and panic attacks. She shares that she felt alone most days until she joined an anxiety management support group on Facebook. She was receptive to anxiety and depression management coping skill education. LCSW provided education on relaxation techniques such as meditation, deep breathing, massage, grounding exercsies or yoga  that can activate the body's relaxation response and ease symptoms of stress and anxiety. LCSW ask that when pt is struggling with difficult emotions and racing thoughts that they start this relaxation response process. LCSW provided extensive education on healthy coping skills for anxiety. SW used active and reflective listening, validated patient's feelings/concerns, and provided emotional support. ? ?Motivational Interviewing employed ?Depression screen reviewed  ?PHQ2/ PHQ9 completed ?Mindfulness or Relaxation training provided ?Active listening / Reflection utilized  ?Emotional Support Provided ?Problem Solving /Task Center strategies reviewed ?Provided psychoeducation for mental health needs  ?Provided brief CBT  ?Reviewed mental health medications and discussed importance of compliance:  ?Quality of sleep assessed & Sleep  Hygiene techniques promoted  ?Participation in counseling encouraged  ?Verbalization of feelings encouraged  ?Suicidal Ideation/Homicidal Ideation assessed: Patient denies SI/HI  ?Review resources, discussed

## 2022-02-22 ENCOUNTER — Ambulatory Visit: Payer: Medicaid Other | Admitting: Family Medicine

## 2022-03-07 ENCOUNTER — Other Ambulatory Visit: Payer: Self-pay | Admitting: Obstetrics and Gynecology

## 2022-03-07 NOTE — Patient Instructions (Signed)
Hi Jasmine Gordon, thanks for speaking with me-have a wonderful afternoon!! ? ?[name] was given information about Medicaid Managed Care team care coordination services as a part of their Healthy [facility] Medicaid benefit. Jasmine Gordon verbally consented to engagement with the [facility] Managed Care team.  ? ?If you are experiencing a medical emergency, please call 911 or report to your local emergency department or urgent care.  ? ?If you have a non-emergency medical problem during routine business hours, please contact your provider's office and ask to speak with a nurse.  ? ?For questions related to your Healthy [facility] health plan, please call: [phone] or visit the homepage here: [email or web address] ? ?If you would like to schedule transportation through your Healthy [facility] plan, please call the following number at least 2 days in advance of your appointment: [phone] ? For information about your ride after you set it up, call Ride Assist at [phone]. Use this number to activate a Will Call pickup, or if your transportation is late for a scheduled pickup. Use this number, too, if you need to make a change or cancel a previously scheduled reservation. ? If you need transportation services right away, call [phone]. The after-hours call center is staffed 24 hours to handle ride assistance and urgent reservation requests (including discharges) 365 days a year. Urgent trips include sick visits, hospital discharge requests and life-sustaining treatment. ? ?Call the [facility] Line at [phone], at any time, 24 hours a day, 7 days a week. If you are in danger or need immediate medical attention call 911. ? ?If you would like help to quit smoking, call 1-800-QUIT-NOW (708-650-0050) OR Espa?ol: 1-855-D?jelo-Ya (303)370-4151) o para m?s informaci?n haga clic aqu? or Text READY to 200-400 to register via text ? ?[name]  following are the goals we discussed in your visit today:  ? Goals Addressed   ? ?Long-Range Goal: Establish Plan of Care for Chronic Disease Management Needs   ?Start Date: 03/07/2022  ?Expected End Date: 06/06/2022  ?Priority: High  ?Note:   ?Timeframe:  Long-Range Goal ?Priority:  High ?Start Date:   03/07/22                          ?Expected End Date:     ongoing                 ? ?Follow Up Date 04/06/22  ?  ?- schedule appointment for flu shot ?- schedule appointment for vaccines needed due to my age or health ?- schedule recommended health tests (blood work, mammogram, colonoscopy, pap test) ?- schedule and keep appointment for annual check-up  ?  ?Why is this important?   ?Screening tests can find diseases early when they are easier to treat.  ?Your doctor or nurse will talk with you about which tests are important for you.  ?Getting shots for common diseases like the flu and shingles will help prevent them.    ? ?Patient verbalizes understanding of instructions and care plan provided today and agrees to view in MyChart. Active MyChart status confirmed with patient.   ? ?The Managed Medicaid care management team will reach out to the patient again over the next 30 business  days.  ?The  Patient  has been provided with contact information for the Managed Medicaid care management team and has been advised to call with any health related questions or concerns.  ? ?[name] RN, BSN ?[facility] Customer service manager ?Care Management Coordinator - Managed Medicaid High Risk ?[phone] ?  ?Following is a copy of your plan of care:  ?Care Plan : RN Care Manager Plan of Care  ?Updates made by [name], RN since 03/07/2022 12:00 AM  ?  ? ?Problem: Health Promotion or Disease Self-Management (General Plan of Care)   ?  ? ?Long-Range Goal: Chronic Disease Management and Care Coordination Needs   ?Priority: High  ?Note:   ?Current Barriers:  ?Knowledge Deficits related to plan of care for management of  Asthma  ?Chronic Disease Management support and education needs related to Asthma  ? ?RNCM Clinical Goal(s):  ?Patient will verbalize understanding of plan for management of Asthma as evidenced by patient report ?verbalize basic understanding of  Asthma disease process and self health management plan as evidenced by patient report ?take all medications exactly as prescribed and will call provider for medication related questions as evidenced by patient report ?demonstrate understanding of rationale for each prescribed medication as evidenced by patient report ?attend all scheduled medical appointments as evidenced by patient report ?continue to work with RN Care Manager to address care management and care coordination needs related to  Asthma as evidenced by adherence to CM Team Scheduled appointments ?work with social worker to address  related to the management of Mental Health Concerns  related to the management of Anxiety, Depression, and PTSD, panic attacks as evidenced by review of EMR and patient or social worker report ?experience decrease in ED visits as evidenced by EMR review.  ED visits in in last 6 months = 12  through collaboration with RN Care manager, provider, and care team.  ? ?Interventions: ?Inter-disciplinary care team collaboration (see longitudinal plan of care) ?Evaluation of current treatment plan related to  self management and patient's adherence to plan as established by provider ? ?Asthma: (Status:New goal.) Long Term Goal ?Advised patient to track and manage Asthma triggers ?Advised patient to self assesses Asthma action plan zone and make appointment with provider if in the yellow zone for 48 hours without improvement ?Discussed the importance of adequate rest and management of fatigue with Asthma ?Assessed social determinant of health barriers  ? ?Patient Goals/Self-Care Activities: ?Take all medications as prescribed ?Attend all scheduled provider appointments ?Call pharmacy for  medication refills 3-7 days in advance of running out of medications ?Perform all self care activities independently  ?Perform IADL's (shopping, preparing meals, housekeeping, managing finances) independently ?Call provider office for new concerns or questions  ?Work with the Child psychotherapist to address care coordination needs and will continue to work with the clinical team to address health care and disease management related needs ?Patient to contact Pharmacy and/or Provider regarding medication ? ?Follow Up Plan:  The patient has been provided with contact information for the care management team and has been advised to call with any health related questions or concerns.  ?The care management team will reach out to the patient again over the next 30 business  days.   ? ? ? ?  ?

## 2022-03-07 NOTE — Patient Outreach (Signed)
Medicaid Managed Care   Nurse Care Manager Note  03/07/2022 Name:  Jasmine Gordon MRN:  [id number] DOB:  1986-05-25  Jasmine Gordon is an 36 y.o. year old female who is a primary patient of [name], DO.  The [facility] Managed Care Coordination team was consulted for assistance with:    Chronic healthcare management needs, asthma, anxiety, depression, PTSD, panic attacks, HLD  [name] was given information about Medicaid Managed Care Coordination team services today. Jasmine Gordon Patient agreed to services and verbal consent obtained.  Engaged with patient by telephone for initial visit in response to provider referral for case management and/or care coordination services.   Assessments/Interventions:  Review of past medical history, allergies, medications, health status, including review of consultants reports, laboratory and other test data, was performed as part of comprehensive evaluation and provision of chronic care management services.  SDOH (Social Determinants of Health) assessments and interventions performed:  Care Plan  Allergies  Allergen Reactions   Reglan [Metoclopramide] Hives and Shortness Of Breath   Contrave [Naltrexone-Bupropion Hcl Er] Other (See Comments)    Had some reaction to the medication    Medications Reviewed Today     Reviewed by [name], RN (Registered Nurse) on 03/07/22 at 1454  Med List Status: <None>   Medication Order Taking? Sig Documenting Provider Last Dose Status Informant  albuterol (VENTOLIN HFA) 108 (90 Base) MCG/ACT inhaler [id number] No Inhale 2 puffs into the lungs every 6 (six) hours as needed for wheezing or shortness of breath.  Patient not taking: Reported on 03/07/2022   [name], DO Not Taking Active   cetirizine (ZYRTEC ALLERGY) 10 MG tablet [id number] No Take 1 tablet (10 mg total) by mouth daily.  Patient not taking: Reported on 03/07/2022   Lorre Munroe, NP Not  Taking Active   chlorhexidine (PERIDEX) 0.12 % solution [id number] Yes Use as directed 10 mLs in the mouth or throat. [provider] Taking Active   fluticasone (FLONASE) 50 MCG/ACT nasal spray [id number]  Place 1 spray into both nostrils daily. Use for 4-6 weeks then stop and use seasonally or as needed. Lorre Munroe, NP  Active   gabapentin (NEURONTIN) 100 MG capsule [id number]  Take 1-3 capsules (100-300 mg total) by mouth at bedtime as needed (nerve pain). May increase dose as needed.  Patient not taking: Reported on 01/21/2022   [name], DO  Active   hydrOXYzine (ATARAX) 10 MG tablet [id number]  Take 1 tablet (10 mg total) by mouth 3 (three) times daily as needed for anxiety. Karamalegos, Netta Neat, DO  Active   montelukast (SINGULAIR) 10 MG tablet [id number]  Take 1 tablet (10 mg total) by mouth at bedtime. [name], DO  Active   nystatin (MYCOSTATIN/NYSTOP) powder [id number]  Apply 1 application topically 3 (three) times daily. Natale Milch, MD  Active   sertraline (ZOLOFT) 50 MG tablet [id number] No Take 1 tablet (50 mg total) by mouth daily.  Patient not taking: Reported on 03/07/2022   [name], DO Not Taking Active   triamcinolone cream (KENALOG) 0.5 % [id number] No Apply 1 application topically 2 (two) times daily. To affected areas, for up to 2 weeks.  Patient not taking: Reported on 03/07/2022   [name], DO Not Taking Active   vitamin B-12 (CYANOCOBALAMIN) 1000 MCG tablet [id number]  Take 1 tablet (1,000 mcg total) by mouth daily. [name], DO  Active   Vitamin  D, Ergocalciferol, (DRISDOL) 1.25 MG (50000 UNIT) CAPS capsule [id number]  Take 1 capsule (50,000 Units total) by mouth every 7 (seven) days. [name], DO  Active            Patient Active Problem List   Diagnosis Date Noted   Palpitations 01/21/2022   Shortness of breath 01/21/2022   Chest pain 01/21/2022    Generalized anxiety disorder with panic attacks 09/27/2021   Environmental and seasonal allergies 11/04/2020   Genital warts 09/11/2018   Positive TB test 09/25/2017   Allergic contact dermatitis due to metals 05/17/2017   Pre-diabetes 03/06/2017   Hyperlipidemia 03/06/2017   Allergic reaction 02/08/2017   Urticaria due to food allergy 02/08/2017   Vitamin D deficiency 11/08/2016   Iron deficiency anemia due to chronic blood loss 10/03/2016   Menorrhagia with regular cycle 06/27/2016   Mild persistent asthma without complication 06/17/2016   Anxiety and depression 06/17/2016   Morbid obesity (HCC) 06/17/2016   PTSD (post-traumatic stress disorder) 2016   Conditions to be addressed/monitored per PCP order:  Chronic healthcare management needs, asthma, anxiety, depression, PTSD, panic attacks, HLD  Care Plan : RN Care Manager Plan of Care  Updates made by [name], RN since 03/07/2022 12:00 AM     Problem: Health Promotion or Disease Self-Management (General Plan of Care)      Long-Range Goal: Chronic Disease Management and Care Coordination Needs   Priority: High  Note:   Current Barriers:  Knowledge Deficits related to plan of care for management of Asthma  Chronic Disease Management support and education needs related to Asthma   RNCM Clinical Goal(s):  Patient will verbalize understanding of plan for management of Asthma as evidenced by patient report verbalize basic understanding of  Asthma disease process and self health management plan as evidenced by patient report take all medications exactly as prescribed and will call provider for medication related questions as evidenced by patient report demonstrate understanding of rationale for each prescribed medication as evidenced by patient report attend all scheduled medical appointments as evidenced by patient report continue to work with RN Care Manager to address care management and care coordination needs related to   Asthma as evidenced by adherence to CM Team Scheduled appointments work with social worker to address  related to the management of Mental Health Concerns  related to the management of Anxiety, Depression, and PTSD, panic attacks as evidenced by review of EMR and patient or social worker report experience decrease in ED visits as evidenced by EMR review.  ED visits in in last 6 months = 12  through collaboration with RN Care manager, provider, and care team.   Interventions: Inter-disciplinary care team collaboration (see longitudinal plan of care) Evaluation of current treatment plan related to  self management and patient's adherence to plan as established by provider  Asthma: (Status:New goal.) Long Term Goal Advised patient to track and manage Asthma triggers Advised patient to self assesses Asthma action plan zone and make appointment with provider if in the yellow zone for 48 hours without improvement Discussed the importance of adequate rest and management of fatigue with Asthma Assessed social determinant of health barriers   Patient Goals/Self-Care Activities: Take all medications as prescribed Attend all scheduled provider appointments Call pharmacy for medication refills 3-7 days in advance of running out of medications Perform all self care activities independently  Perform IADL's (shopping, preparing meals, housekeeping, managing finances) independently Call provider office for new concerns or questions  Work  with the social worker to address care coordination needs and will continue to work with the clinical team to address health care and disease management related needs Patient to contact Pharmacy and/or Provider regarding medication  Follow Up Plan:  The patient has been provided with contact information for the care management team and has been advised to call with any health related questions or concerns.  The care management team will reach out to the patient again over  the next 30 business  days.    Long-Range Goal: Establish Plan of Care for Chronic Disease Management Needs   Start Date: 03/07/2022  Expected End Date: 06/06/2022  Priority: High  Note:   Timeframe:  Long-Range Goal Priority:  High Start Date:   03/07/22                          Expected End Date:     ongoing                  Follow Up Date 04/06/22    - schedule appointment for flu shot - schedule appointment for vaccines needed due to my age or health - schedule recommended health tests (blood work, mammogram, colonoscopy, pap test) - schedule and keep appointment for annual check-up    Why is this important?   Screening tests can find diseases early when they are easier to treat.  Your doctor or nurse will talk with you about which tests are important for you.  Getting shots for common diseases like the flu and shingles will help prevent them.      Follow Up:  Patient agrees to Care Plan and Follow-up.  Plan: The Managed Medicaid care management team will reach out to the patient again over the next 30 days. and The  Patient has been provided with contact information for the Managed Medicaid care management team and has been advised to call with any health related questions or concerns.  Date/time of next scheduled RN care management/care coordination outreach:  04/12/22 at 0900.

## 2022-03-10 ENCOUNTER — Encounter: Payer: Self-pay | Admitting: Family Medicine

## 2022-03-10 ENCOUNTER — Encounter: Payer: Self-pay | Admitting: Internal Medicine

## 2022-03-10 ENCOUNTER — Ambulatory Visit (INDEPENDENT_AMBULATORY_CARE_PROVIDER_SITE_OTHER): Payer: Medicaid Other | Admitting: Internal Medicine

## 2022-03-10 VITALS — BP 116/86 | HR 99 | Ht 67.0 in | Wt 236.0 lb

## 2022-03-10 DIAGNOSIS — R0602 Shortness of breath: Secondary | ICD-10-CM

## 2022-03-10 DIAGNOSIS — R002 Palpitations: Secondary | ICD-10-CM | POA: Diagnosis not present

## 2022-03-10 DIAGNOSIS — J4530 Mild persistent asthma, uncomplicated: Secondary | ICD-10-CM

## 2022-03-10 DIAGNOSIS — R079 Chest pain, unspecified: Secondary | ICD-10-CM | POA: Diagnosis not present

## 2022-03-10 NOTE — Patient Instructions (Signed)
Medication Instructions:  ? ?Your physician recommends that you continue on your current medications as directed. Please refer to the Current Medication list given to you today. ? ?*If you need a refill on your cardiac medications before your next appointment, please call your pharmacy* ? ? ?Lab Work: ? ?None ordered ? ?Testing/Procedures: ? ?None ordered ? ? ?Follow-Up: ?At [facility], you and your health needs are our priority.  As part of our continuing mission to provide you with exceptional heart care, we have created designated Provider Care Teams.  These Care Teams include your primary Cardiologist (physician) and Advanced Practice Providers (APPs -  Physician Assistants and Nurse Practitioners) who all work together to provide you with the care you need, when you need it. ? ?We recommend signing up for the patient portal called "MyChart".  Sign up information is provided on this After Visit Summary.  MyChart is used to connect with patients for Virtual Visits (Telemedicine).  Patients are able to view lab/test results, encounter notes, upcoming appointments, etc.  Non-urgent messages can be sent to your provider as well.   ?To learn more about what you can do with MyChart, go to [email or web address].   ? ?Your next appointment:   ? ?Follow up as needed ? ?Important Information About Sugar ? ? ? ? ? ? ?

## 2022-03-10 NOTE — Progress Notes (Signed)
? ?Follow-up Outpatient Visit ?Date: 03/10/2022 ? ?Primary Care Provider: ?[name], DO ?[address] ?[place] [address] ? ?Chief Complaint: Follow-up chest pain, shortness of breath, and palpitations ? ?HPI:  [name] is a 36 y.o. female with history of hyperlipidemia, iron deficiency anemia, asthma, obesity, panic attacks, and PTSD, who presents for follow-up of palpitations, chest pain, and shortness of breath.  I met her last month following an ED visit for chest pain, shortness of breath, palpitations, and presyncope.  Other than mild hypokalemia and anemia, ED work-up was unremarkable.  We agreed to obtain an echocardiogram and 14-day event monitor.  Other than rare PACs and PVCs noted on the monitor, both studies were unremarkable.  Today, [name] reports that she is feeling a bit better.  She has been using ashwagandha to help manage her depression/anxiety and feels like this is working better for her.  She still has occasional mild shortness of breath that she attributes to anxiety and seasonal allergies.  She denies chest pain, palpitations, lightheadedness, and edema.  She has noticed that her blood pressures at home are sometimes a little low, around 90/60, albeit without symptoms. ?-------------------------------------------------------------------------------------------------- ?  ?Cardiovascular History & Procedures: ?Cardiovascular Problems: ?Chest pain and shortness of breath ?Palpitations ?  ?Risk Factors: ?Hyperlipidemia and obesity ?  ?Cath/PCI: ?None ?  ?CV Surgery: ?None ?  ?EP Procedures and Devices: ?14-day event monitor (01/21/2022): Predominantly sinus rhythm with rare PACs and PVCs. ?  ?Non-Invasive Evaluation(s): ?TTE (02/11/2021): Normal LV size and wall thickness.  LVEF 55-60% with normal wall motion and diastolic function.  Normal RV size and function.  Normal PA pressure.  Normal biatrial size.  Mild tricuspid regurgitation.  Normal CVP. ? ?Recent CV  Pertinent Labs: ?Lab Results  ?Component Value Date  ? CHOL 224 (H) 09/07/2021  ? HDL 42 (L) 09/07/2021  ? LDLCALC 164 (H) 09/07/2021  ? TRIG 77 09/07/2021  ? CHOLHDL 5.3 (H) 09/07/2021  ? BNP 11.0 12/09/2018  ? K 3.5 02/06/2022  ? K 3.5 09/17/2014  ? BUN 9 02/06/2022  ? BUN 11 09/17/2014  ? CREATININE 0.70 02/06/2022  ? CREATININE 0.73 11/04/2020  ? ? ?Past medical and surgical history were reviewed and updated in EPIC. ? ?Current Meds  ?Medication Sig  ? albuterol (VENTOLIN HFA) 108 (90 Base) MCG/ACT inhaler Inhale 2 puffs into the lungs every 6 (six) hours as needed for wheezing or shortness of breath.  ? cetirizine (ZYRTEC) 10 MG tablet Take 10 mg by mouth daily as needed for allergies.  ? chlorhexidine (PERIDEX) 0.12 % solution Use as directed 10 mLs in the mouth or throat.  ? fluticasone (FLONASE) 50 MCG/ACT nasal spray Place 1 spray into both nostrils daily. Use for 4-6 weeks then stop and use seasonally or as needed.  ? hydrOXYzine (ATARAX) 10 MG tablet Take 1 tablet (10 mg total) by mouth 3 (three) times daily as needed for anxiety.  ? montelukast (SINGULAIR) 10 MG tablet Take 10 mg by mouth at bedtime as needed.  ? OVER THE COUNTER MEDICATION Goli ashwaganda gummy 1/2 daily  ? Vitamin D, Ergocalciferol, (DRISDOL) 1.25 MG (50000 UNIT) CAPS capsule Take 1 capsule (50,000 Units total) by mouth every 7 (seven) days.  ? ? ?Allergies: Reglan [metoclopramide] and Contrave [naltrexone-bupropion hcl er] ? ?Social History  ? ?Tobacco Use  ? Smoking status: Never  ? Smokeless tobacco: Never  ?Vaping Use  ? Vaping Use: Never used  ?Substance Use Topics  ? Alcohol use:  Not Currently  ? Drug use: No  ? ? ?Family History  ?Problem Relation Age of Onset  ? Alcohol abuse Mother   ? Drug abuse Mother   ? Diabetes Mother   ? Depression Mother   ? Mental retardation Mother   ? Bipolar disorder Mother   ? Heart disease Father   ? Diabetes Father   ? Heart failure Sister   ? Stroke Maternal Grandfather   ? Diabetes Maternal  Grandfather   ? ? ?Review of Systems: ?A 12-system review of systems was performed and was negative except as noted in the HPI. ? ?-------------------------------------------------------------------------------------------------- ? ?Physical Exam: ?BP 116/86 (BP Location: Left Arm, Patient Position: Sitting, Cuff Size: Large)   Pulse 99   Ht '5\' 7"'  (1.702 m)   Wt 236 lb (107 kg)   SpO2 96%   BMI 36.96 kg/m?  ? ?General:  NAD. ?Neck: No JVD or HJR. ?Lungs: Clear to auscultation bilaterally without wheezes or crackles. ?Heart: Regular rate and rhythm without murmurs, rubs, or gallops. ?Abdomen: Soft, nontender, nondistended. ?Extremities: No lower extremity edema. ? ?Lab Results  ?Component Value Date  ? WBC 5.1 02/06/2022  ? HGB 11.8 (L) 02/06/2022  ? HCT 37.9 02/06/2022  ? MCV 83.3 02/06/2022  ? PLT 229 02/06/2022  ? ? ?Lab Results  ?Component Value Date  ? NA 139 02/06/2022  ? K 3.5 02/06/2022  ? CL 107 02/06/2022  ? CO2 25 02/06/2022  ? BUN 9 02/06/2022  ? CREATININE 0.70 02/06/2022  ? GLUCOSE 110 (H) 02/06/2022  ? ALT 10 02/06/2022  ? ? ?Lab Results  ?Component Value Date  ? CHOL 224 (H) 09/07/2021  ? HDL 42 (L) 09/07/2021  ? LDLCALC 164 (H) 09/07/2021  ? TRIG 77 09/07/2021  ? CHOLHDL 5.3 (H) 09/07/2021  ? ? ?-------------------------------------------------------------------------------------------------- ? ?ASSESSMENT AND PLAN: ?Chest pain, shortness of breath, and palpitations: ?Symptoms have improved with use of ashwagandha for depression/anxiety.  Work-up thus far, including echo and event monitor, was unrevealing.  I do not recommend further cardiac testing or intervention at this time.  Primary prevention of coronary disease, including lifestyle modifications to help improve weight and lipids, is recommended. ? ?Follow-up: Return to clinic as needed. ? ?Nelva Bush, MD ?03/10/2022 ?9:33 AM ? ?

## 2022-03-14 ENCOUNTER — Other Ambulatory Visit: Payer: Self-pay | Admitting: Licensed Clinical Social Worker

## 2022-03-14 MED ORDER — AEROCHAMBER MV MISC: 2 refills | Status: DC

## 2022-03-14 NOTE — Patient Outreach (Signed)
?Medicaid Managed Care ?Social Work Note ? ?03/14/2022 ?Name:  Jasmine Gordon MRN:  [id number] DOB:  05/20/1986 ? ?Jasmine Gordon is an 36 y.o. year old female who is a primary patient of [name], DO.  The [facility] Managed Care Coordination team was consulted for assistance with:  Mental Health Counseling and Resources ? ?[name] was given information about Medicaid Managed Care Coordination team services today. [place] Patient agreed to services and verbal consent obtained. ? ?Engaged with patient  for by telephone forfollow up visit in response to referral for case management and/or care coordination services.  ? ?Assessments/Interventions:  Review of past medical history, allergies, medications, health status, including review of consultants reports, laboratory and other test data, was performed as part of comprehensive evaluation and provision of chronic care management services. ? ?SDOH: (Social Determinant of Health) assessments and interventions performed: ?SDOH Interventions   ? ?Flowsheet Row Most Recent Value  ?SDOH Interventions   ?Stress Interventions Provide Counseling, [employer]  ? ?  ? ? ?Advanced Directives Status:  Not addressed in this encounter. ? ?Care Plan ?                ?Allergies  ?Allergen Reactions  ? Reglan [Metoclopramide] Hives and Shortness Of Breath  ? Contrave [Naltrexone-Bupropion Hcl Er] Other (See Comments)  ?  Had some reaction to the medication  ? ? ?Medications Reviewed Today   ? ? Reviewed by Nelva Bush, MD (Physician) on 03/10/22 at [phone]  Med List Status: <None>  ? ?Medication Order Taking? Sig Documenting Provider Last Dose Status Informant  ?albuterol (VENTOLIN HFA) 108 (90 Base) MCG/ACT inhaler [id number] Yes Inhale 2 puffs into the lungs every 6 (six) hours as needed for wheezing or shortness of breath. [name], DO Taking Active   ?cetirizine (ZYRTEC) 10 MG tablet [id number]  Yes Take 10 mg by mouth daily as needed for allergies. [provider] Taking Active   ?chlorhexidine (PERIDEX) 0.12 % solution [id number] Yes Use as directed 10 mLs in the mouth or throat. [provider] Taking Active   ?fluticasone (FLONASE) 50 MCG/ACT nasal spray [id number] Yes Place 1 spray into both nostrils daily. Use for 4-6 weeks then stop and use seasonally or as needed. Jearld Fenton, NP Taking Active   ?hydrOXYzine (ATARAX) 10 MG tablet [id number] Yes Take 1 tablet (10 mg total) by mouth 3 (three) times daily as needed for anxiety. [name], DO Taking Active   ?montelukast (SINGULAIR) 10 MG tablet [id number] Yes Take 10 mg by mouth at bedtime as needed. [provider] Taking Active   ?OVER THE COUNTER MEDICATION [id number] Yes Goli ashwaganda gummy 1/2 daily [provider] Taking Active   ?sertraline (ZOLOFT) 50 MG tablet [id number] No Take 1 tablet (50 mg total) by mouth daily.  ?Patient not taking: Reported on 03/07/2022  ? [name], DO Not Taking Active   ?Vitamin D, Ergocalciferol, (DRISDOL) 1.25 MG (50000 UNIT) CAPS capsule [id number] Yes Take 1 capsule (50,000 Units total) by mouth every 7 (seven) days. [name], DO Taking Active   ? ?  ?  ? ?  ? ? ?Patient Active Problem List  ? Diagnosis Date Noted  ? Palpitations 01/21/2022  ? Shortness of breath 01/21/2022  ? Chest pain 01/21/2022  ? Generalized anxiety disorder with panic attacks 09/27/2021  ? Environmental and seasonal allergies 11/04/2020  ? Genital warts 09/11/2018  ? Positive TB test 09/25/2017  ?  Allergic contact dermatitis due to metals 05/17/2017  ? Pre-diabetes 03/06/2017  ? Hyperlipidemia 03/06/2017  ? Allergic reaction 02/08/2017  ? Urticaria due to food allergy 02/08/2017  ? Vitamin D deficiency 11/08/2016  ? Iron deficiency anemia due to chronic blood loss 10/03/2016  ? Menorrhagia with regular cycle 06/27/2016  ? Mild persistent asthma without  complication 0000000  ? Anxiety and depression 06/17/2016  ? Morbid obesity ([place]) 06/17/2016  ? PTSD (post-traumatic stress disorder) 2016  ? ? ?Conditions to be addressed/monitored per PCP order:  Anxiety and Depression ? ?Care Plan : LCSW Plan of Care  ?Updates made by Greg Cutter, LCSW since 03/14/2022 12:00 AM  ?  ? ?Problem: Anxiety Identification (Anxiety)   ?  ? ?Long-Range Goal: To find a long term counselor in order to learn how to alleviate my anxiety and stress   ?Start Date: 02/14/2022  ?Priority: High  ?Note:   ?Priority: High ? ?Timeframe:  Long-Range Goal ?Priority:  High ?Start Date:   02/14/22                ?Expected End Date:  ongoing                   ?  ?Follow Up Date--04/13/22 ? ?- check out counseling ?- keep 90 percent of counseling appointments ?- schedule counseling appointment  ?  ?Why is this important?   ?          Beating depression may take some time.  ?          If you don't feel better right away, don't give up on your treatment plan.  ? ? Current barriers:   ?Chronic Mental Health needs related to anxiety, panic and stress management  ?Mental Health Concerns  and Social Isolation ?Needs Support, Education, and Care Coordination in order to meet unmet mental health needs. ? ?Clinical Goal(s): verbalize understanding of plan for management of Anxiety, Panic and Stress  ?   ?Clinical Interventions:  ?Assessed patient's previous and current treatment, coping skills, support system and barriers to care. Patient and spouse provided hx  ?Verbalization of feelings encouraged, motivational interviewing employed ?Emotional support provided, positive coping strategies explored ?Self care/establishing healthy boundaries emphasized ?Patient reports that her anxiety has increased which has affected her ability to carry out work and function daily.  ?Patient receives strong support from spouse ?Patient is agreeable to referral to [facility] for counseling. Patient has already started psychiatry  treatment there. [facility] LCSW made referral on 02/14/22. Update 03/14/22- Patient continues to receive psychiatry from [facility] but they do not have any available therapist for counseling. Patient is in need of counseling as her stress has increased since she had to start back work last week. She reports that her disability is still processing and she is very frustrated with this process. [facility] LCSW sent patient a secure email with [facility] contact information. She is agreeable to contact them to schedule an intake appointment for counseling. Patient did not want [facility]. She reports that she cannot talk long because she is back at work today and experiencing extreme stress. Emotional support provided to patient.  ?Patient recently started back work which has negatively affected her mental health.  ?Patient reports that she experiences both anxiety and panic attacks. She shares that she felt alone most days until she joined an anxiety management support group on Facebook. She was receptive to anxiety and depression management coping skill education. LCSW provided education on relaxation techniques such as  meditation, deep breathing, massage, grounding exercsies or yoga that can activate the body's relaxation response and ease symptoms of stress and anxiety. LCSW ask that when pt is struggling with difficult emotions and racing thoughts that they start this relaxation response process. LCSW provided extensive education on healthy coping skills for anxiety. SW used active and reflective listening, validated patient's feelings/concerns, and provided emotional support. ? ?Motivational Interviewing employed ?Depression screen reviewed  ?PHQ2/ PHQ9 completed ?Mindfulness or Relaxation training provided ?Active listening / Reflection utilized  ?Emotional Support Provided ?Problem Solving /Task Center strategies reviewed ?Provided psychoeducation for mental health needs  ?Provided brief CBT  ?Reviewed mental health medications and  discussed importance of compliance:  ?Quality of sleep assessed & Sleep Hygiene techniques promoted  ?Participation in counseling encouraged  ?Verbalization of feelings encouraged  ?Suicidal Ideation/Homicidal Sharol Har[place]

## 2022-03-14 NOTE — Patient Instructions (Signed)
Visit Information ? ?[name] was given information about Medicaid Managed Care team care coordination services as a part of their Healthy [facility] Medicaid benefit. Jasmine Gordon verbally consented to engagement with the [facility] Managed Care team.  ? ?If you are experiencing a medical emergency, please call 911 or report to your local emergency department or urgent care.  ? ?If you have a non-emergency medical problem during routine business hours, please contact your provider's office and ask to speak with a nurse.  ? ?For questions related to your Healthy [facility] health plan, please call: [phone] or visit the homepage here: [email or web address] ? ?If you would like to schedule transportation through your Healthy [facility] plan, please call the following number at least 2 days in advance of your appointment: [phone] ? For information about your ride after you set it up, call Ride Assist at [phone]. Use this number to activate a Will Call pickup, or if your transportation is late for a scheduled pickup. Use this number, too, if you need to make a change or cancel a previously scheduled reservation. ? If you need transportation services right away, call [phone]. The after-hours call center is staffed 24 hours to handle ride assistance and urgent reservation requests (including discharges) 365 days a year. Urgent trips include sick visits, hospital discharge requests and life-sustaining treatment. ? ?Call the [facility] Line at [phone], at any time, 24 hours a day, 7 days a week. If you are in danger or need immediate medical attention call 911. ? ?If you would like help to quit smoking, call 1-800-QUIT-NOW ((484)524-5772) OR Espa?ol: 1-855-D?jelo-Ya (832)853-3739) o para m?s informaci?n haga clic aqu? or Text READY to 200-400 to register via text ? ? ?Following is a copy of your plan of care:  ?Care Plan : LCSW Plan  of Care  ?Updates made by Gustavus Bryant, LCSW since 03/14/2022 12:00 AM  ?  ? ?Problem: Anxiety Identification (Anxiety)   ?  ? ?Long-Range Goal: To find a long term counselor in order to learn how to alleviate my anxiety and stress   ?Start Date: 02/14/2022  ?Priority: High  ?Note:   ?Priority: High ? ?Timeframe:  Long-Range Goal ?Priority:  High ?Start Date:   02/14/22                ?Expected End Date:  ongoing                   ?  ?Follow Up Date--04/13/22 ? ?- check out counseling ?- keep 90 percent of counseling appointments ?- schedule counseling appointment  ?  ?Why is this important?   ?          Beating depression may take some time.  ?          If you don't feel better right away, don't give up on your treatment plan.  ? ? Current barriers:   ?Chronic Mental Health needs related to anxiety, panic and stress management  ?Mental Health Concerns  and Social Isolation ?Needs Support, Education, and Care Coordination in order to meet unmet mental health needs. ? ?Clinical Goal(s): verbalize understanding of plan for management of Anxiety, Panic and Stress  ?   ?Patient Goals/Self-Care Activities: Over the next 120 days ?Attend scheduled medical appointments ?Utilize healthy coping skills and supportive resources discussed ?Contact PCP with any questions or concerns ? ?Dickie La, BSW, MSW, LCSW ?Managed Medicaid LCSW ?[place]  [facility] ?[email or web address] ?Phone: [phone] ? ? ?  ? ?  ?

## 2022-03-18 ENCOUNTER — Encounter: Payer: Self-pay | Admitting: Oncology

## 2022-03-18 ENCOUNTER — Encounter: Payer: Self-pay | Admitting: Family Medicine

## 2022-03-21 ENCOUNTER — Other Ambulatory Visit: Payer: Self-pay

## 2022-03-21 ENCOUNTER — Encounter: Payer: Self-pay | Admitting: Hematology and Oncology

## 2022-03-21 NOTE — Telephone Encounter (Signed)
Dr. Yu please advise  

## 2022-03-22 ENCOUNTER — Ambulatory Visit: Payer: Medicaid Other | Admitting: Family Medicine

## 2022-03-22 ENCOUNTER — Encounter: Payer: Self-pay | Admitting: Family Medicine

## 2022-03-22 VITALS — BP 128/72 | HR 95 | Ht 67.0 in | Wt 240.2 lb

## 2022-03-22 DIAGNOSIS — R590 Localized enlarged lymph nodes: Secondary | ICD-10-CM

## 2022-03-22 NOTE — Progress Notes (Signed)
? ?Subjective:  ? ? Patient ID: Jasmine Gordon, female    DOB: 1986/09/08, 36 y.o.   MRN: [id number] ? ?Jasmine Gordon is a 36 y.o. female presenting on 03/22/2022 for Adenopathy and Allergies ? ? ?HPI ? ?Neck Lymphadenopathy ?Reports onset 3+ weeks with increased size swelling, non tender. Describes 2 x small pea sized nodules left submental under chin and one on R mid lower anterior aspect. No other areas. ?Additionally reports 3 x wisdom teeth bilateral sides with decay and impaction, may need extraction ?Denies fever chills ? ? ? ?  03/22/2022  ?  8:22 AM 02/14/2022  ?  2:31 PM 02/14/2022  ?  2:05 PM  ?Depression screen PHQ 2/9  ?Decreased Interest 1 2 2   ?Down, Depressed, Hopeless 1 0 3  ?PHQ - 2 Score 2 2 5   ?Altered sleeping 0  2  ?Tired, decreased energy 1  2  ?Change in appetite 1  1  ?Feeling bad or failure about yourself  1  1  ?Trouble concentrating 2  2  ?Moving slowly or fidgety/restless 0  0  ?Suicidal thoughts 0  0  ?PHQ-9 Score 7  13  ?Difficult doing work/chores Very difficult    ? ? ?Social History  ? ?Tobacco Use  ? Smoking status: Never  ? Smokeless tobacco: Never  ?Vaping Use  ? Vaping Use: Never used  ?Substance Use Topics  ? Alcohol use: Not Currently  ? Drug use: No  ? ? ?Review of Systems ?Per HPI unless specifically indicated above ? ?   ?Objective:  ?  ?BP 128/72   Pulse 95   Ht 5\' 7"  (1.702 m)   Wt 240 lb 3.2 oz (109 kg)   SpO2 99%   BMI 37.62 kg/m?   ?Wt Readings from Last 3 Encounters:  ?03/22/22 240 lb 3.2 oz (109 kg)  ?03/10/22 236 lb (107 kg)  ?02/06/22 234 lb 8 oz (106.4 kg)  ?  ?Physical Exam ?Vitals and nursing note reviewed.  ?Constitutional:   ?   General: She is not in acute distress. ?   Appearance: Normal appearance. She is well-developed. She is not diaphoretic.  ?   Comments: Well-appearing, comfortable, cooperative  ?HENT:  ?   Head: Normocephalic and atraumatic.  ?Eyes:  ?   General:     ?   Right eye: No discharge.     ?   Left eye: No discharge.  ?    Conjunctiva/sclera: Conjunctivae normal.  ?Cardiovascular:  ?   Rate and Rhythm: Normal rate.  ?Pulmonary:  ?   Effort: Pulmonary effort is normal.  ?Skin: ?   General: Skin is warm and dry.  ?   Findings: No erythema or rash.  ?Neurological:  ?   Mental Status: She is alert and oriented to person, place, and time.  ?Psychiatric:     ?   Mood and Affect: Mood normal.     ?   Behavior: Behavior normal.     ?   Thought Content: Thought content normal.  ?   Comments: Well groomed, good eye contact, normal speech and thoughts  ? ?Results for orders placed or performed in visit on 02/11/22  ?ECHOCARDIOGRAM COMPLETE  ?Result Value Ref Range  ? AR max vel 2.27 cm2  ? AV Peak grad 4.2 mmHg  ? Ao pk vel 1.03 m/s  ? S' Lateral 3.50 cm  ? Area-P 1/2 4.36 cm2  ? AV Area VTI 2.48 cm2  ? AV Mean grad 2.0  mmHg  ? Single Plane A4C EF 65.1 %  ? Single Plane A2C EF 57.8 %  ? Calc EF 61.1 %  ? AV Area mean vel 2.20 cm2  ? ?   ?Assessment & Plan:  ? ?Problem List Items Addressed This Visit   ?None ?Visit Diagnoses   ? ? Cervical lymphadenopathy    -  Primary  ? Relevant Orders  ? US Soft Tissue Head/Neck (NON-THYROID)  ? ?  ?  ?Localized multiple spots of mild LAD ?Question if secondary to dental infection/dental work needs wisdom teeth extracted or if due to allergy triggers ?Discussed scenario and seems persistent LAD >3+ weeks and she has concerns with these spots since not improving she is interested in testing. ?Will order Neck Soft Tissue US for LAD and re-evaluate in next 1-2 weeks she can schedule if persistent or can cancel if resolved. Likely reactive nodes but we will do testing on them. ? ? ?Orders Placed This Encounter  ?Procedures  ? US Soft Tissue Head/Neck (NON-THYROID)  ?  Standing Status:   Future  ?  Standing Expiration Date:   03/23/2023  ?  Order Specific Question:   Reason for Exam (SYMPTOM  OR DIAGNOSIS REQUIRED)  ?  Answer:   >4 weeks multiple cervical lymph nodes enlarged L submental and R anterior cervical mid   ?  Order Specific Question:   Preferred imaging location?  ?  Answer:   [facility]  ? ? ? ?No orders of the defined types were placed in this encounter. ? ? ? ? ?Follow up plan: ?Return if symptoms worsen or fail to improve. ? ? ?Nobie Putnam, DO ?[facility] ?[facility] ?03/22/2022, 8:31 AM ?

## 2022-03-22 NOTE — Patient Instructions (Addendum)
Thank you for coming to the office today. ? ?Ordered Neck ultrasound for lymph node evaluation multiple spots as we discussed today. At [place], schedule for 2-3 weeks whenever you prefer and we can evaluate. Hopefully just normal reactive lymph nodes due to the dental or allergies. ? ? ?Please schedule a Follow-up Appointment to: Return if symptoms worsen or fail to improve. ? ?If you have any other questions or concerns, please feel free to call the office or send a message through [place]. You may also schedule an earlier appointment if necessary. ? ?Additionally, you may be receiving a survey about your experience at our office within a few days to 1 week by e-mail or mail. We value your feedback. ? ?Nobie Putnam, DO ?[facility] ?

## 2022-03-23 ENCOUNTER — Encounter: Payer: Self-pay | Admitting: Emergency Medicine

## 2022-03-23 ENCOUNTER — Other Ambulatory Visit: Payer: Self-pay

## 2022-03-23 ENCOUNTER — Emergency Department: Payer: Medicaid Other

## 2022-03-23 DIAGNOSIS — R221 Localized swelling, mass and lump, neck: Secondary | ICD-10-CM | POA: Diagnosis not present

## 2022-03-23 DIAGNOSIS — R0789 Other chest pain: Secondary | ICD-10-CM | POA: Diagnosis not present

## 2022-03-23 DIAGNOSIS — R002 Palpitations: Secondary | ICD-10-CM | POA: Diagnosis not present

## 2022-03-23 DIAGNOSIS — J4530 Mild persistent asthma, uncomplicated: Secondary | ICD-10-CM | POA: Insufficient documentation

## 2022-03-23 DIAGNOSIS — R0602 Shortness of breath: Secondary | ICD-10-CM | POA: Insufficient documentation

## 2022-03-23 LAB — CBC
HCT: 36.4 % (ref 36.0–46.0)
Hemoglobin: 11.3 g/dL — ABNORMAL LOW (ref 12.0–15.0)
MCH: 26 pg (ref 26.0–34.0)
MCHC: 31 g/dL (ref 30.0–36.0)
MCV: 83.9 fL (ref 80.0–100.0)
Platelets: 233 10*3/uL (ref 150–400)
RBC: 4.34 MIL/uL (ref 3.87–5.11)
RDW: 13.4 % (ref 11.5–15.5)
WBC: 5.6 10*3/uL (ref 4.0–10.5)
nRBC: 0.4 % — ABNORMAL HIGH (ref 0.0–0.2)

## 2022-03-23 NOTE — ED Triage Notes (Signed)
Pt to ED from home c/o sob tonight.  States hx of asthma and used inhaler at home without relief.  Reports some nausea without vomiting or diarrhea.  Denies cough or fevers.  States mid to right chest tightness as well.  Pt A&Ox4, chest rise even and unlabored, skin WNL, no audible wheezing noted, in NAD at this time. ?

## 2022-03-24 ENCOUNTER — Encounter: Payer: Self-pay | Admitting: Family Medicine

## 2022-03-24 ENCOUNTER — Ambulatory Visit
Admission: RE | Admit: 2022-03-24 | Discharge: 2022-03-24 | Disposition: A | Discharge status: Home / Self Care | Payer: Medicaid Other | Source: Ambulatory Visit | Attending: Family Medicine | Admitting: Family Medicine

## 2022-03-24 ENCOUNTER — Emergency Department
Admission: EM | Admit: 2022-03-24 | Discharge: 2022-03-24 | Disposition: A | Discharge status: Home / Self Care | Payer: Medicaid Other | Attending: Emergency Medicine | Admitting: Emergency Medicine

## 2022-03-24 DIAGNOSIS — R0602 Shortness of breath: Secondary | ICD-10-CM | POA: Diagnosis not present

## 2022-03-24 DIAGNOSIS — R590 Localized enlarged lymph nodes: Secondary | ICD-10-CM | POA: Diagnosis not present

## 2022-03-24 DIAGNOSIS — R0789 Other chest pain: Secondary | ICD-10-CM

## 2022-03-24 LAB — COMPREHENSIVE METABOLIC PANEL
ALT: 10 U/L (ref 0–44)
AST: 17 U/L (ref 15–41)
Albumin: 4.1 g/dL (ref 3.5–5.0)
Alkaline Phosphatase: 50 U/L (ref 38–126)
Anion gap: 4 — ABNORMAL LOW (ref 5–15)
BUN: 13 mg/dL (ref 6–20)
CO2: 25 mmol/L (ref 22–32)
Calcium: 9.1 mg/dL (ref 8.9–10.3)
Chloride: 109 mmol/L (ref 98–111)
Creatinine, Ser: 0.77 mg/dL (ref 0.44–1.00)
GFR, Estimated: >60 mL/min (ref >60)
Glucose, Bld: 107 mg/dL — ABNORMAL HIGH (ref 70–99)
Potassium: 3.4 mmol/L — ABNORMAL LOW (ref 3.5–5.1)
Sodium: 138 mmol/L (ref 135–145)
Total Bilirubin: 0.5 mg/dL (ref 0.3–1.2)
Total Protein: 7.3 g/dL (ref 6.5–8.1)

## 2022-03-24 LAB — TROPONIN I (HIGH SENSITIVITY)
Troponin I (High Sensitivity): <2 ng/L (ref <18)
Troponin I (High Sensitivity): <2 ng/L (ref <18)

## 2022-03-24 LAB — POC URINE PREG, ED: Preg Test, Ur: NEGATIVE

## 2022-03-24 LAB — LIPASE, BLOOD: Lipase: 35 U/L (ref 11–51)

## 2022-03-24 NOTE — ED Provider Notes (Signed)
? ?[facility] ?Provider Note ? ? ? Event Date/Time  ? First MD Initiated Contact with Patient 03/24/22 [phone]   ?  (approximate) ? ? ?History  ? ?Shortness of Breath and Asthma ? ? ?HPI ? ?Jasmine Gordon is a 36 y.o. female with past medical history of asthma, panic attacks, PTSD, anxiety and obesity who presents with chest tightness and shortness of breath.  Patient notes that she has been feeling somewhat anxious this week.  She has swollen lymph nodes on the left neck and is undergoing an ultrasound tomorrow and is been worried about this.  She has been lifting heavy boxes at work and tonight started to feel tightness across her chest and then was having difficulty breathing while trying to sleep became quite anxious about this.  Used her inhaler which did not help much.  She denies cough fevers chills.  She is currently asymptomatic.  The patient denies hx of prior DVT/PE, unilateral leg pain/swelling, hormone use, recent surgery, hx of cancer, prolonged immobilization, or hemoptysis.  ? ?  ? ?Past Medical History:  ?Diagnosis Date  ? Allergy   ? Anemia   ? Anxiety   ? Asthma   ? Migraine   ? Obese   ? Panic attack   ? PTSD (post-traumatic stress disorder) 2016  ? ? ?Patient Active Problem List  ? Diagnosis Date Noted  ? Palpitations 01/21/2022  ? Shortness of breath 01/21/2022  ? Chest pain 01/21/2022  ? Generalized anxiety disorder with panic attacks 09/27/2021  ? Environmental and seasonal allergies 11/04/2020  ? Genital warts 09/11/2018  ? Positive TB test 09/25/2017  ? Allergic contact dermatitis due to metals 05/17/2017  ? Pre-diabetes 03/06/2017  ? Hyperlipidemia 03/06/2017  ? Allergic reaction 02/08/2017  ? Urticaria due to food allergy 02/08/2017  ? Vitamin D deficiency 11/08/2016  ? Iron deficiency anemia due to chronic blood loss 10/03/2016  ? Menorrhagia with regular cycle 06/27/2016  ? Mild persistent asthma without complication 06/17/2016  ? Anxiety and depression  06/17/2016  ? Morbid obesity ([facility]) 06/17/2016  ? PTSD (post-traumatic stress disorder) 2016  ? ? ? ?Physical Exam  ?Triage Vital Signs: ?ED Triage Vitals  ?Enc Vitals Group  ?   BP 03/23/22 2329 (!) 129/93  ?   Pulse Rate 03/23/22 2329 88  ?   Resp 03/23/22 2329 16  ?   Temp 03/23/22 2329 98.4 ?F (36.9 ?C)  ?   Temp Source 03/23/22 2329 Oral  ?   SpO2 03/23/22 2329 100 %  ?   Weight 03/23/22 2329 240 lb (108.9 kg)  ?   Height 03/23/22 2329 5\' 7"  (1.702 m)  ?   Head Circumference --   ?   Peak Flow --   ?   Pain Score 03/23/22 2330 6  ?   Pain Loc --   ?   Pain Edu? --   ?   Excl. in GC? --   ? ? ?Most recent vital signs: ?Vitals:  ? 03/24/22 0230 03/24/22 0300  ?BP: 118/66 122/75  ?Pulse: 82 76  ?Resp: 19 15  ?Temp:    ?SpO2: 100% 100%  ? ? ? ?General: Awake, no distress.  ?CV:  Good peripheral perfusion.  ?Resp:  Normal effort. Clear, no wheezing ?Abd:  No distention.  ?Neuro:             Awake, Alert, Oriented x 3  ?Other:  Shotty left anterior cervical lymphadenopathy ? ? ?ED Results / Procedures /  Treatments  ?Labs ?(all labs ordered are listed, but only abnormal results are displayed) ?Labs Reviewed  ?CBC - Abnormal; Notable for the following components:  ?    Result Value  ? Hemoglobin 11.3 (*)   ? nRBC 0.4 (*)   ? All other components within normal limits  ?COMPREHENSIVE METABOLIC PANEL - Abnormal; Notable for the following components:  ? Potassium 3.4 (*)   ? Glucose, Bld 107 (*)   ? Anion gap 4 (*)   ? All other components within normal limits  ?POC URINE PREG, ED - Normal  ?LIPASE, BLOOD  ?TROPONIN I (HIGH SENSITIVITY)  ?TROPONIN I (HIGH SENSITIVITY)  ? ? ? ?EKG ? ?EKG interpreted by myself shows sinus rhythm with nonspecific T wave changes in V2 3 and aVF similar to prior ? ? ?RADIOLOGY ?I reviewed the CXR which does not show any acute cardiopulmonary process; agree with radiology report  ? ? ? ?PROCEDURES: ? ?Critical Care performed: No ? ?Procedures ? ? ?MEDICATIONS ORDERED IN ED: ?Medications - No data  to display ? ? ?IMPRESSION / MDM / ASSESSMENT AND PLAN / ED COURSE  ?I reviewed the triage vital signs and the nursing notes. ?             ?               ? ?Differential diagnosis includes, but is not limited to, asthma attack, panic attack, anxiety, less likely ACS, PE ? ?Is a 36 year old female presents with chest tightness and dyspnea.  This is in setting of feeling anxious this week.  Symptoms were associated with some warmth and numbness in bilateral hands feel like she could not take a deep breath.  She is currently asymptomatic feels much better now.  Vitals are within normal limits no tachycardia or hypoxia.  Lungs are clear there is no wheezing she has no signs of DVT on exam.  She is PERC negative.  EKG has no acute ischemic changes similar to prior troponins are negative.  hCG negative.  Chest x-ray without acute abnormality.  Overall my suspicion of this is was related to anxiety given she is now asymptomatic with reassuring work-up I think she is appropriate for discharge. ? ?  ? ? ?FINAL CLINICAL IMPRESSION(S) / ED DIAGNOSES  ? ?Final diagnoses:  ?Shortness of breath  ?Chest tightness  ? ? ? ?Rx / DC Orders  ? ?ED Discharge Orders   ? ? None  ? ?  ? ? ? ?Note:  This document was prepared using Dragon voice recognition software and may include unintentional dictation errors. ?  ?Georga Hacking, MD ?03/24/22 [phone] ? ?

## 2022-03-24 NOTE — Discharge Instructions (Signed)
Your blood work and EKG and chest x-ray were all reassuring today.  Please follow-up with your primary care provider. ?

## 2022-03-25 ENCOUNTER — Telehealth: Payer: Self-pay

## 2022-03-25 ENCOUNTER — Ambulatory Visit (INDEPENDENT_AMBULATORY_CARE_PROVIDER_SITE_OTHER): Payer: Medicaid Other | Admitting: Licensed Clinical Social Worker

## 2022-03-25 DIAGNOSIS — F4310 Post-traumatic stress disorder, unspecified: Secondary | ICD-10-CM

## 2022-03-25 NOTE — Progress Notes (Signed)
Virtual Visit via Video Note ? ?I connected with Jasmine Gordon on 03/25/22 at  9:00 AM EDT by a video enabled telemedicine application and verified that I am speaking with the correct person using two identifiers. ? ?Location: ?Patient: home ?Provider: remote office [place], [place]) ?  ?I discussed the limitations of evaluation and management by telemedicine and the availability of in person appointments. The patient expressed understanding and agreed to proceed. ?  ?I discussed the assessment and treatment plan with the patient. The patient was provided an opportunity to ask questions and all were answered. The patient agreed with the plan and demonstrated an understanding of the instructions. ?  ?The patient was advised to call back or seek an in-person evaluation if the symptoms worsen or if the condition fails to improve as anticipated. ? ?I provided 55 minutes of non-face-to-face time during this encounter. ? ? ?Jasmine Gordon R Jasmine Notte, LCSW ?Comprehensive Clinical Assessment (CCA) Note ? ?03/25/2022 ?Jasmine Gordon ?[id number] ? ?Chief Complaint:  ?Chief Complaint  ?Patient presents with  ? Establish Care  ? ?Visit Diagnosis:  ?  ?Encounter Diagnosis  ?Name Primary?  ? PTSD (post-traumatic stress disorder) Yes  ? ? ? ? ?CCA Screening, Triage and Referral (STR) ? ?Patient Reported Information ?How did you hear about us? No data recorded ?Referral name: Dr. Barbee Cougheina Hisada ? ?Referral phone number: No data recorded ? ?Whom do you see for routine medical problems? Primary Care ? ?Practice/Facility Name: No data recorded ?Practice/Facility Phone Number: No data recorded ?Name of Contact: No data recorded ?Contact Number: No data recorded ?Contact Fax Number: No data recorded ?Prescriber Name: No data recorded ?Prescriber Address (if known): No data recorded ? ?What Is the Reason for Your Visit/Call Today? Jasmine BaseDorthia is a 36 yo female reporting virtually to [facility] for establishment of outpatient  psychotherapy  services.Patient reports that she is currently experiencing frequent panic attacks. Patient reports that she has a prescription for hydroxyzine but does not take it on a regular basis. Patient reports that she is getting around six hours of sleep per night with occasional nights of insomnia. Patient reports that she did have an inpatient hospitalization in 2016 ?I voluntarily checked myself in? for severe panic attacks. Patient denies any significant medical issues, but admits that she often has anxiety around health related concerns. Patient reports that she is married and has three sons aged 34, 2513, and 30eight. Patient reports that she was exposed to significant trauma in the past and has flashbacks at times. Patient denies any current suicidal ideation, homicidal ideation, or any perceptual disturbances. Patient would like help processing through trauma and managing anxiety and panic attacks. ? ?How Long Has This Been Causing You Problems? 1-6 months ? ?What Do You Feel Would Help You the Most Today? Treatment for Depression or other mood problem ? ? ?Have You Recently Been in Any Inpatient Treatment (Hospital/Detox/Crisis Center/28-Day Program)? No ? ?Name/Location of Program/Hospital:No data recorded ?How Long Were You There? No data recorded ?When Were You Discharged? No data recorded ? ?Have You [facility] Re[facility] Services From [facility] Before? Yes ? ?Who [facility] You [facility] at [facility]? No data recorded ? ?Have You Recently Had Any Thoughts About Hurting Yourself? No ? ?Are You Planning to Commit Suicide/Harm Yourself At This time? No ? ? ?Have you Recently Had Thoughts About Hurting Someone Jasmine Ohslse? No ? ?Explanation: No data recorded ? ?Have You Used Any Alcohol or Drugs in the Past 24 Hours? No ? ?How Long Ago Did You  Use Drugs or Alcohol? No data recorded ?What Did You Use and How Much? No data recorded ? ?Do You Currently Have a Therapist/Psychiatrist? Yes ? ?Name of Therapist/Psychiatrist: Dr. Neysa Hotter ? ? ?Have You Been Recently Discharged From Any Office Practice or Programs? No ? ?Explanation of Discharge From Practice/Program: No data recorded ? ?  ?CCA Screening Triage Referral Assessment ?Type of Contact: Tele-Assessment ? ?Is this Initial or Reassessment? Initial Assessment ? ?Date Telepsych consult ordered in [facility]:  No data recorded ?Time Telepsych consult ordered in [facility]:  No data recorded ? ?Patient Reported Information Reviewed? No data recorded ?Patient Left Without Being Seen? No data recorded ?Reason for Not Completing Assessment: No data recorded ? ?Collateral Involvement: none ? ? ?Does Patient Have a Automotive engineer Guardian? No data recorded ?Name and Contact of Legal Guardian: No data recorded ?If Minor and Not Living with Parent(s), Who has Custody? No data recorded ?Is CPS involved or ever been involved? In the Past ? ?Is APS involved or ever been involved? Never ? ? ?Patient Determined To Be At Risk for Harm To Self or Others Based on Review of Patient Reported Information or Presenting Complaint? No ? ?Method: No data recorded ?Availability of Means: No data recorded ?Intent: No data recorded ?Notification Required: No data recorded ?Additional Information for Danger to Others Potential: No data recorded ?Additional Comments for Danger to Others Potential: No data recorded ?Are There Guns or Other Weapons in Your Home? No data recorded ?Types of Guns/Weapons: No data recorded ?Are These Weapons Safely Secured?                            No data recorded ?Who Could Verify You Are Able To Have These Secured: No data recorded ?Do You Have any Outstanding Charges, Pending Court Dates, Parole/Probation? No data recorded ?Contacted To Inform of Risk of Harm To Self or Others: Other: Comment (na) ? ? 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 ? ?Does Patient Present under Involuntary Commitment? No ? ?IVC Papers Initial File Date: No data recorded ? ?[place] of Residence:  [place] ? ? ?Patient Currently Receiving the Following Services: Medication Management ? ? ?Determination of Need: No data recorded ? ?Options For Referral: Outpatient Therapy; Medication Management ? ? ? ? ?CCA Biopsychosocial ?Intake/Chief Complaint:  anxiety, trauma ? ?Current Symptoms/Problems: panic attacks, generalized anxiety, flashbacks ? ? ?Patient Reported Schizophrenia/Schizoaffective Diagnosis in Past: No ? ? ?Strengths: good self awareness ? ?Preferences: outpatient psychiatric supports ? ?Abilities: pt willing to make time for appointments/sessions ? ? ?Type of Services Patient Feels are Needed: medication management, outpatient therapy ? ? ?Initial Clinical Notes/Concerns: No data recorded ? ?Mental Health Symptoms ?Depression:   ?Tearfulness; Weight gain/loss ?  ?Duration of Depressive symptoms: Greater than two weeks ?  ?Mania:  No data recorded  ?Anxiety:    ?Worrying; Sleep; Restlessness; Difficulty concentrating; Fatigue; Irritability ?  ?Psychosis:   ?None ?  ?Duration of Psychotic symptoms: No data recorded  ?Trauma:   ?Re-experience of traumatic event (flashbacks and nighmares) ?  ?Obsessions:   ?None ?  ?Compulsions:   ?None ?  ?Inattention:   ?N/A ?  ?Hyperactivity/Impulsivity:   ?N/A ?  ?Oppositional/Defiant Behaviors:   ?None ?  ?Emotional Irregularity:  None ?  ?Other Mood/Personality Symptoms:  pt cooperative throughout assessment ?  ? ?Mental Status Exam ?Appearance and self-care  ?Stature:  Average ?  ?Weight:  Overweight ?  ?Clothing:  Neat/clean ?  ?  Grooming:  Normal ?  ?Cosmetic use:  Age appropriate ?  ?Posture/gait:  Normal ?  ?Motor activity:  Not Remarkable ?  ?Sensorium  ?Attention:  Normal ?  ?Concentration:  Normal ?  ?Orientation:  X5 ?  ?Recall/memory:  Normal ?  ?Affect and Mood  ?Affect:  Appropriate ?  ?Mood:  Anxious ?  ?Relating  ?Eye contact:  Normal ?  ?Facial expression:  Anxious ?  ?Attitude toward examiner:  Cooperative ?  ?Thought and Language  ?Speech flow:  Clear and Coherent ?  ?Thought content:  Appropriate to Mood and Circumstances ?  ?Preoccupation:  None ?  ?Hallucinations:  None ?  ?Organization:  No data recorded  ?Executive Functions  ?Fund of Knowledge:  Good ?  ?[employer]

## 2022-03-25 NOTE — Plan of Care (Signed)
Developed tx plan based on pt information ?

## 2022-03-25 NOTE — Telephone Encounter (Signed)
Transition Care Management Unsuccessful Follow-up Telephone Call ? ?Date of discharge and from where:  03/24/2022-[facility]  ? ?Attempts:  1st Attempt ? ?Reason for unsuccessful TCM follow-up call:  Left voice message ? ?  ?
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
## 2022-03-28 ENCOUNTER — Ambulatory Visit: Payer: Medicaid Other | Admitting: Psychiatry

## 2022-03-28 NOTE — Telephone Encounter (Signed)
Transition Care Management Unsuccessful Follow-up Telephone Call ? ?Date of discharge and from where:  03/24/2022-Jasmine Gordon  ? ?Attempts:  2nd Attempt ? ?Reason for unsuccessful TCM follow-up call:  Left voice message ? ? ? ?

## 2022-03-29 NOTE — Telephone Encounter (Signed)
Transition Care Management Unsuccessful Follow-up Telephone Call ? ?Date of discharge and from where:  03/24/2022-[facility]  ? ?Attempts:  3rd Attempt ? ?Reason for unsuccessful TCM follow-up call:  Unable to reach patient ? ? ? ?

## 2022-04-12 ENCOUNTER — Other Ambulatory Visit: Payer: Self-pay | Admitting: Obstetrics and Gynecology

## 2022-04-12 NOTE — Patient Outreach (Signed)
Medicaid Managed Care   Nurse Care Manager Note  04/12/2022 Name:  Jasmine Gordon MRN:  [id number] DOB:  1986/04/18  Jasmine Gordon is an 36 y.o. year old female who is a primary patient of Smitty CordsKaramalegos, Alexander J, DO.  [facility] for assistance with:    Healthcare management needs, asthma, anxiety/depression/panic attacks , HLD  [name] was given information about Medicaid Managed Care Coordination team services today. Jasmine Gordon Patient agreed to services and verbal consent obtained.  Engaged with patient by telephone for follow up visit in response to provider referral for case management and/or care coordination services.   Assessments/Interventions:  Review of past medical history, allergies, medications, health status, including review of consultants reports, laboratory and other test data, was performed as part of comprehensive evaluation and provision of chronic care management services.  SDOH (Social Determinants of Health) assessments and interventions performed: SDOH Interventions    Flowsheet Row Most Recent Value  SDOH Interventions   Housing Interventions Intervention Not Indicated     Care Plan  Allergies  Allergen Reactions   Reglan [Metoclopramide] Hives and Shortness Of Breath   Contrave [Naltrexone-Bupropion Hcl Er] Other (See Comments)    Had some reaction to the medication   Medications Reviewed Today     Reviewed by Danie Chandlerraft, Shakemia Madera G, RN (Registered Nurse) on 04/12/22 at 0919  Med List Status: <None>   Medication Order Taking? Sig Documenting Provider Last Dose Status Informant  albuterol (VENTOLIN HFA) 108 (90 Base) MCG/ACT inhaler [id number] No Inhale 2 puffs into the lungs every 6 (six) hours as needed for wheezing or shortness of breath. Smitty CordsKaramalegos, Alexander J, DO Taking Active   cetirizine (ZYRTEC) 10 MG tablet [id number] No Take 10 mg by mouth daily as needed for allergies.  [provider] Taking Active   chlorhexidine (PERIDEX) 0.12 % solution [id number] No Use as directed 10 mLs in the mouth or throat. [provider] Taking Active   fluticasone (FLONASE) 50 MCG/ACT nasal spray [id number] No Place 1 spray into both nostrils daily. Use for 4-6 weeks then stop and use seasonally or as needed. Lorre MunroeBaity, Jasmine W, NP Taking Active   hydrOXYzine (ATARAX) 10 MG tablet [id number] No Take 1 tablet (10 mg total) by mouth 3 (three) times daily as needed for anxiety. Karamalegos, Jasmine NeatAlexander J, DO Taking Active   montelukast (SINGULAIR) 10 MG tablet [id number] No Take 10 mg by mouth at bedtime as needed. [provider] Taking Active   OVER THE COUNTER MEDICATION [id number] No Jasmine Gordon ashwaganda gummy 1/2 daily [provider] Taking Active   sertraline (ZOLOFT) 50 MG tablet [id number] No Take 1 tablet (50 mg total) by mouth daily.  Patient not taking: Reported on 03/07/2022   Smitty CordsKaramalegos, Alexander J, DO Not Taking Active   Spacer/Aero-Holding Chambers (AEROCHAMBER MV) inhaler [id number] No Use as instructed Smitty CordsKaramalegos, Alexander J, DO Taking Active   Vitamin D, Ergocalciferol, (DRISDOL) 1.25 MG (50000 UNIT) CAPS capsule [id number] No Take 1 capsule (50,000 Units total) by mouth every 7 (seven) days. Smitty CordsKaramalegos, Alexander J, DO Taking Active            Patient Active Problem List   Diagnosis Date Noted   Palpitations 01/21/2022   Shortness of breath 01/21/2022   Chest pain 01/21/2022   Generalized anxiety disorder with panic attacks 09/27/2021   Environmental and seasonal allergies 11/04/2020   Genital warts 09/11/2018   Positive TB test 09/25/2017   Allergic contact dermatitis due  to metals 05/17/2017   Pre-diabetes 03/06/2017   Hyperlipidemia 03/06/2017   Allergic reaction 02/08/2017   Urticaria due to food allergy 02/08/2017   Vitamin D deficiency 11/08/2016   Iron deficiency anemia due to chronic blood loss 10/03/2016   Menorrhagia with  regular cycle 06/27/2016   Mild persistent asthma without complication 06/17/2016   Anxiety and depression 06/17/2016   Morbid obesity (HCC) 06/17/2016   PTSD (post-traumatic stress disorder) 2016   Conditions to be addressed/monitored per PCP order:  Healthcare management needs, asthma, anxiety/depression/panic attacks  Care Plan : RN Care Manager Plan of Care  Updates made by [name], RN since 04/12/2022 12:00 AM     Problem: Health Promotion or Disease Self-Management (General Plan of Care)      Long-Range Goal: Chronic Disease Management and Care Coordination Needs   Priority: High  Note:   Current Barriers:  Knowledge Deficits related to plan of care for management of Asthma  Chronic Disease Management support and education needs related to Asthma  04/12/22:  Patient states she only experiences SOB with anxiety along with CP and palpitations.  CARDS appointment 03/10/22-WNL.  Patient to continue counseling services.  RNCM Clinical Goal(s):  Patient will verbalize understanding of plan for management of Asthma as evidenced by patient report verbalize basic understanding of  Asthma disease process and self health management plan as evidenced by patient report take all medications exactly as prescribed and will call provider for medication related questions as evidenced by patient report demonstrate understanding of rationale for each prescribed medication as evidenced by patient report attend all scheduled medical appointments: as evidenced by patient report continue to work with RN Care Manager to address care management and care coordination needs related to  Asthma as evidenced by adherence to CM Team Scheduled appointments work with social worker to address  related to the management of Mental Health Concerns  related to the management of Anxiety, Depression, and PTSD, panic attacks as evidenced by review of EMR and patient or social worker report experience decrease in ED  visits as evidenced by EMR review.  ED visits in in last 6 months = 12 through collaboration with RN Care manager, provider, and care team.   Interventions: Inter-disciplinary care team collaboration (see longitudinal plan of care) Evaluation of current treatment plan related to  self management and patient's adherence to plan as established by provider  Asthma: (Status:New goal.) Long Term Goal Advised patient to track and manage Asthma triggers Advised patient to self assesses Asthma action plan zone and make appointment with provider if in the yellow zone for 48 hours without improvement Discussed the importance of adequate rest and management of fatigue with Asthma Assessed social determinant of health barriers  Collaborated with SW SW referral for anxiety/depression/panic attacks-completed  Patient Goals/Self-Care Activities: Take all medications as prescribed Attend all scheduled provider appointments Call pharmacy for medication refills 3-7 days in advance of running out of medications Perform all self care activities independently  Perform IADL's (shopping, preparing meals, housekeeping, managing finances) independently Call provider office for new concerns or questions  Work with the social worker to address care coordination needs and will continue to work with the clinical team to address health care and disease management related needs Patient to contact Pharmacy and/or Provider regarding medication  Follow Up Plan:  The patient has been provided with contact information for the care management team and has been advised to call with any health related questions or concerns.  The care management  team will reach out to the patient again over the next 30 business  days.   Long-Range Goal: Establish Plan of Care for Chronic Disease Management Needs   Start Date: 03/07/2022  Expected End Date: 06/06/2022  Priority: High  Note:   Timeframe:  Long-Range Goal Priority:  High Start  Date:   03/07/22                          Expected End Date:     ongoing                  Follow Up Date 05/12/22   - schedule appointment for flu shot - schedule appointment for vaccines needed due to my age or health - schedule recommended health tests (blood work, mammogram, colonoscopy, pap test) - schedule and keep appointment for annual check-up    Why is this important?   Screening tests can find diseases early when they are easier to treat.  Your doctor or nurse will talk with you about which tests are important for you.  Getting shots for common diseases like the flu and shingles will help prevent them.  04/12/22:  Patient scheduled to have wisdom teeth removed next week   Follow Up:  Patient agrees to Care Plan and Follow-up.  Plan: The Managed Medicaid care management team will reach out to the patient again over the next 30 days. and The  Patient has been provided with contact information for the Managed Medicaid care management team and has been advised to call with any health related questions or concerns.  Date/time of next scheduled RN care management/care coordination outreach:  05/12/22 at 0900

## 2022-04-12 NOTE — Patient Instructions (Signed)
Hi [name], nice to catch up with you this morning-I hope you have a nice day.  [name] was given information about Medicaid Managed Care team care coordination services as a part of their Healthy Jasmine Gordon benefit. Jasmine Gordon verbally consented to engagement with the Jasmine Gordon Managed Care team.   If you are experiencing a medical emergency, please call 911 or report to your local emergency department or urgent care.   If you have a non-emergency medical problem during routine business hours, please contact your provider's office and ask to speak with a nurse.   For questions related to your Healthy Jasmine Gordon health plan, please call: [phone] or visit the homepage here: Jasmine Gordon  If you would like to schedule transportation through your Healthy Jasmine Gordon plan, please call the following number at least 2 days in advance of your appointment: [phone]  For information about your ride after you set it up, call Ride Assist at [phone]. Use this number to activate a Will Call pickup, or if your transportation is late for a scheduled pickup. Use this number, too, if you need to make a change or cancel a previously scheduled reservation.  If you need transportation services right away, call [phone]. The after-hours call [facility] is staffed 24 hours to handle ride assistance and urgent reservation requests (including discharges) 365 days a year. Urgent trips include sick visits, [facility] discharge requests and life-sustaining treatment.  Call the [facility] at [phone], at any time, 24 hours a day, 7 days a week. If you are in danger or need immediate medical attention call 911.  If you would like help to quit smoking, call 1-800-QUIT-NOW 631-181-7635) OR Espaol: 1-855-Djelo-Ya QO:409462) o para ms informacin haga clic aqu or Text READY to 200-400 to register via  text  [name] following are the goals we discussed in your visit today:   Goals Addressed    Long-Range Goal: Establish Plan of Care for Chronic Disease Management Needs   Start Date: 03/07/2022  Expected End Date: 06/06/2022  Priority: High  Note:   Timeframe:  Long-Range Goal Priority:  High Start Date:   03/07/22                          Expected End Date:     ongoing                  Follow Up Date 05/12/22   - schedule appointment for flu shot - schedule appointment for vaccines needed due to my age or health - schedule recommended health tests (blood work, mammogram, colonoscopy, pap test) - schedule and keep appointment for annual check-up    Why is this important?   Screening tests can find diseases early when they are easier to treat.  Your doctor or nurse will talk with you about which tests are important for you.  Getting shots for common diseases like the flu and shingles will help prevent them.  04/12/22:  Patient scheduled to have wisdom teeth removed next week   Patient verbalizes understanding of instructions and care plan provided today and agrees to view in Jasmine Gordon. Active MyChart status and patient understanding of how to access instructions and care plan via MyChart confirmed with patient.     The Managed Medicaid care management team will reach out to the patient again over the next 30 days.  The  Patient has been provided with contact information  for the Managed Medicaid care management team and has been advised to call with any health related questions or concerns.   Jasmine Raider RN, BSN [place] Management Coordinator - Managed Medicaid High Risk [phone]   Following is a copy of your plan of care:  Care Plan : [place] of Care  Updates made by Jasmine Medicus, RN since 04/12/2022 12:00 AM     Problem: Health Promotion or Disease Self-Management (General Plan of Care)      Long-Range Goal:  Chronic Disease Management and Care Coordination Needs   Priority: High  Note:   Current Barriers:  Knowledge Deficits related to plan of care for management of Asthma  Chronic Disease Management support and education needs related to Asthma  04/12/22:  Patient states she only experiences SOB with anxiety along with CP and palpitations.  CARDS appointment 03/10/22-WNL.  Patient to continue counseling services.  RNCM Clinical Goal(s):  Patient will verbalize understanding of plan for management of Asthma as evidenced by patient report verbalize basic understanding of  Asthma disease process and self health management plan as evidenced by patient report take all medications exactly as prescribed and will call provider for medication related questions as evidenced by patient report demonstrate understanding of rationale for each prescribed medication as evidenced by patient report attend all scheduled medical appointments: as evidenced by patient report continue to work with RN Care Manager to address care management and care coordination needs related to  Asthma as evidenced by adherence to CM Team Scheduled appointments work with social worker to address  related to the management of Mental Health Concerns  related to the management of Anxiety, Depression, and PTSD, panic attacks as evidenced by review of EMR and patient or social worker report experience decrease in ED visits as evidenced by EMR review.  ED visits in in last 6 months = 12 through collaboration with RN Care manager, provider, and care team.   Interventions: Inter-disciplinary care team collaboration (see longitudinal plan of care) Evaluation of current treatment plan related to  self management and patient's adherence to plan as established by provider  Asthma: (Status:New goal.) Long Term Goal Advised patient to track and manage Asthma triggers Advised patient to self assesses Asthma action plan zone and make appointment with  provider if in the yellow zone for 48 hours without improvement Discussed the importance of adequate rest and management of fatigue with Asthma Assessed social determinant of health barriers  Collaborated with SW SW referral for anxiety/depression/panic attacks-completed  Patient Goals/Self-Care Activities: Take all medications as prescribed Attend all scheduled provider appointments Call pharmacy for medication refills 3-7 days in advance of running out of medications Perform all self care activities independently  Perform IADL's (shopping, preparing meals, housekeeping, managing finances) independently Call provider office for new concerns or questions  Work with the social worker to address care coordination needs and will continue to work with the clinical team to address health care and disease management related needs Patient to contact Pharmacy and/or Provider regarding medication  Follow Up Plan:  The patient has been provided with contact information for the care management team and has been advised to call with any health related questions or concerns.  The care management team will reach out to the patient again over the next 30 business  days.

## 2022-04-13 ENCOUNTER — Encounter: Payer: Self-pay | Admitting: Psychiatry

## 2022-04-13 ENCOUNTER — Other Ambulatory Visit: Payer: Self-pay | Admitting: Licensed Clinical Social Worker

## 2022-04-13 ENCOUNTER — Telehealth (INDEPENDENT_AMBULATORY_CARE_PROVIDER_SITE_OTHER): Payer: Medicaid Other | Admitting: Psychiatry

## 2022-04-13 ENCOUNTER — Ambulatory Visit: Payer: Medicaid Other

## 2022-04-13 DIAGNOSIS — F4310 Post-traumatic stress disorder, unspecified: Secondary | ICD-10-CM | POA: Diagnosis not present

## 2022-04-13 DIAGNOSIS — F411 Generalized anxiety disorder: Secondary | ICD-10-CM

## 2022-04-13 DIAGNOSIS — F410 Panic disorder [episodic paroxysmal anxiety] without agoraphobia: Secondary | ICD-10-CM | POA: Diagnosis not present

## 2022-04-13 DIAGNOSIS — F3342 Major depressive disorder, recurrent, in full remission: Secondary | ICD-10-CM

## 2022-04-13 MED ORDER — FLUOXETINE HCL 20 MG PO CAPS: 20.0000 mg | ORAL_CAPSULE | Freq: Every day | ORAL | 1 refills | Status: DC
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
## 2022-04-13 NOTE — Patient Outreach (Signed)
Medicaid Managed Care Social Work Note  04/13/2022 Name:  Jasmine Gordon MRN:  [id number] DOB:  1986-10-06  Jasmine Gordon is an 36 y.o. year old female who is a primary patient of [name], DO.  The Medicaid Managed Care Coordination team was consulted for assistance with:  [place] and Resources  [name] was given information about Medicaid Managed Care Coordination team services today. [place] Patient agreed to services and verbal consent obtained.  Engaged with patient  for by telephone forfollow up visit in response to referral for case management and/or care coordination services.   Assessments/Interventions:  Review of past medical history, allergies, medications, health status, including review of consultants reports, laboratory and other test data, was performed as part of comprehensive evaluation and provision of chronic care management services.  SDOH: (Social Determinant of Health) assessments and interventions performed: SDOH Interventions    Flowsheet Row Most Recent Value  SDOH Interventions   Stress Interventions Offered [employer], Provide Counseling       Advanced Directives Status:  See Care Plan for related entries.  Care Plan                 Allergies  Allergen Reactions   Reglan [Metoclopramide] Hives and Shortness Of Breath   Contrave [Naltrexone-Bupropion Hcl Er] Other (See Comments)    Had some reaction to the medication    Medications Reviewed Today     Reviewed by Gayla Medicus, RN (Registered Nurse) on 04/12/22 at [place] List Status: <None>   Medication Order Taking? Sig Documenting Provider Last Dose Status Informant  albuterol (VENTOLIN HFA) 108 (90 Base) MCG/ACT inhaler [id number] No Inhale 2 puffs into the lungs every 6 (six) hours as needed for wheezing or shortness of breath. [name], DO Taking Active   cetirizine (ZYRTEC) 10 MG tablet  [id number] No Take 10 mg by mouth daily as needed for allergies. [provider] Taking Active   chlorhexidine (PERIDEX) 0.12 % solution [id number] No Use as directed 10 mLs in the mouth or throat. [provider] Taking Active   fluticasone (FLONASE) 50 MCG/ACT nasal spray [id number] No Place 1 spray into both nostrils daily. Use for 4-6 weeks then stop and use seasonally or as needed. Jearld Fenton, NP Taking Active   hydrOXYzine (ATARAX) 10 MG tablet [id number] No Take 1 tablet (10 mg total) by mouth 3 (three) times daily as needed for anxiety. Karamalegos, Devonne Doughty, DO Taking Active   montelukast (SINGULAIR) 10 MG tablet [id number] No Take 10 mg by mouth at bedtime as needed. [provider] Taking Active   OVER THE COUNTER MEDICATION [id number] No Goli ashwaganda gummy 1/2 daily [provider] Taking Active   sertraline (ZOLOFT) 50 MG tablet [id number] No Take 1 tablet (50 mg total) by mouth daily.  Patient not taking: Reported on 03/07/2022   [name], DO Not Taking Active   Spacer/Aero-Holding Chambers (AEROCHAMBER MV) inhaler [id number] No Use as instructed [name], DO Taking Active   Vitamin D, Ergocalciferol, (DRISDOL) 1.25 MG (50000 UNIT) CAPS capsule [id number] No Take 1 capsule (50,000 Units total) by mouth every 7 (seven) days. [name], DO Taking Active             Patient Active Problem List   Diagnosis Date Noted   Palpitations 01/21/2022   Shortness of breath 01/21/2022   Chest pain 01/21/2022   Generalized anxiety disorder with panic attacks  09/27/2021   Environmental and seasonal allergies 11/04/2020   Genital warts 09/11/2018   Positive TB test 09/25/2017   Allergic contact dermatitis due to metals 05/17/2017   Pre-diabetes 03/06/2017   Hyperlipidemia 03/06/2017   Allergic reaction 02/08/2017   Urticaria due to food allergy 02/08/2017   Vitamin D deficiency 11/08/2016   Iron  deficiency anemia due to chronic blood loss 10/03/2016   Menorrhagia with regular cycle 06/27/2016   Mild persistent asthma without complication 0000000   Anxiety and depression 06/17/2016   Morbid obesity ([place]) 06/17/2016   PTSD (post-traumatic stress disorder) 2016    Conditions to be addressed/monitored per PCP order:  Anxiety and Depression  Care Plan : LCSW Plan of Care  Updates made by Greg Cutter, LCSW since 04/13/2022 12:00 AM     Problem: Anxiety Identification (Anxiety)      Long-Range Goal: To find a long term counselor in order to learn how to alleviate my anxiety and stress   Start Date: 02/14/2022  Priority: High  Note:   Priority: High  Timeframe:  Long-Range Goal Priority:  High Start Date:   02/14/22                Expected End Date:  ongoing                     Follow Up Date--05/18/22 at 2:45 pm  - check out counseling - keep 90 percent of counseling appointments - schedule counseling appointment    Why is this important?             Beating depression may take some time.            If you don't feel better right away, don't give up on your treatment plan.    Current barriers:   Chronic Mental Health needs related to anxiety, panic and stress management  Mental Health Concerns  and Social Isolation Needs Support, Education, and Care Coordination in order to meet unmet mental health needs.  Clinical Goal(s): verbalize understanding of plan for management of Anxiety, Panic and Stress     Clinical Interventions:  Assessed patient's previous and current treatment, coping skills, support system and barriers to care. Patient and spouse provided hx  Verbalization of feelings encouraged, motivational interviewing employed Emotional support provided, positive coping strategies explored Self care/establishing healthy boundaries emphasized Patient reports that her anxiety has increased which has affected her ability to carry out work and function daily.   Patient receives strong support from spouse Patient is agreeable to referral to [facility] for counseling. Patient has already started psychiatry treatment there. [facility] LCSW made referral on 02/14/22. Update 03/14/22- Patient continues to receive psychiatry from [facility] but they do not have any available therapist for counseling. Patient is in need of counseling as her stress has increased since she had to start back work last week. She reports that her disability is still processing and she is very frustrated with this process. [facility] LCSW sent patient a secure email with [facility] contact information. She is agreeable to contact them to schedule an intake appointment for counseling. Patient did not want [facility]. She reports that she cannot talk long because she is back at work today and experiencing extreme stress. Emotional support provided to patient.  Patient recently started back work which has negatively affected her mental health.  Patient reports that she experiences both anxiety and panic attacks. She shares that she felt alone most days until she joined an anxiety  management support group on Facebook. She was receptive to anxiety and depression management coping skill education. LCSW provided education on relaxation techniques such as meditation, deep breathing, massage, grounding exercsies or yoga that can activate the body's relaxation response and ease symptoms of stress and anxiety. LCSW ask that when pt is struggling with difficult emotions and racing thoughts that they start this relaxation response process. LCSW provided extensive education on healthy coping skills for anxiety. SW used active and reflective listening, validated patient's feelings/concerns, and provided emotional support.  Motivational Interviewing employed Depression screen reviewed  PHQ2/ PHQ9 completed Mindfulness or Relaxation training provided Active listening / Reflection utilized  Emotional Support Provided Problem [place] strategies reviewed Provided psychoeducation for mental health needs  Provided brief CBT  Reviewed mental health medications and discussed importance of compliance:  Quality of sleep assessed & Sleep Hygiene techniques promoted  Participation in counseling encouraged  Verbalization of feelings encouraged  Suicidal Ideation/Homicidal Ideation assessed: Patient denies SI/HI  Review resources, discussed options and provided patient information about  [place] care team collaboration (see longitudinal plan of care) UPDATE 04/13/22- 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 Positive reinforcement provided. Patient has completed counseling session at [facility] and has upcoming appointment on 05/16/22. However, patient was a no show for her psychiatry appointment and did not reschedule this. [facility] LCSW completed joint call with patient to [facility] and was able to schedule her psychiatry appointment for today at 4:00 pm. This appointment will be virtual. [facility] LCSW emailed patient a list of coping skills for anxiety, depression and stress which include the following: When your car dies or a deadline looms, how do you respond? Long-term, low-grade or acute stress takes a serious toll on your body and mind, so don't ignore feelings of constant tension. Stress is a natural part of life. However, too much stress can harm our health, especially if it continues every day. This is chronic stress and can put you at risk for heart problems like heart disease and depression. Understand what's happening inside your body and learn simple coping skills to combat the negative impacts of everyday stressors. Types of Stress There are two types of stress: Emotional - types of emotional stress are relationship problems, pressure at work, financial worries, experiencing discrimination or having a major life change. Physical  - Examples of physical stress include being sick having pain, not sleeping well, recovery from an injury or having an alcohol and drug use disorder. Fight or Flight Sudden or ongoing stress activates your nervous system and floods your bloodstream with adrenaline and cortisol, two hormones that raise blood pressure, increase heart rate and spike blood sugar. These changes pitch your body into a fight or flight response. That enabled our ancestors to outrun saber-toothed tigers, and it's helpful today for situations like dodging a car accident. But most modern chronic stressors, such as finances or a challenging relationship, keep your body in that heightened state, which hurts your health. Effects of Too Much Stress If constantly under stress, most of [place] will eventually start to function less well.  Multiple studies link chronic stress to a higher risk of heart disease, stroke, depression, weight gain, memory loss and even premature death, so it's important to recognize the warning signals. Talk to your doctor about ways to manage stress if you're experiencing any of these symptoms: Prolonged periods of poor sleep. Regular, severe headaches. Unexplained weight  loss or gain. Feelings of isolation, withdrawal or worthlessness. Constant anger and irritability. Loss of interest in activities. Constant worrying or obsessive thinking. Excessive alcohol or drug use. Inability to concentrate.  10 Ways to Cope with Chronic Stress It's key to recognize stressful situations as they occur because it allows you to focus on managing how you react. We all need to know when to close our eyes and take a deep breath when we feel tension rising. Use these tips to prevent or reduce chronic stress. 1. Rebalance Work and Home All work and no play? If you're spending too much time at the office, intentionally put more dates in your calendar to enjoy time for fun, either alone or with others. 2. Get Regular  Exercise Moving your body on a regular basis balances the nervous system and increases blood circulation, helping to flush out stress hormones. Even a daily 20-minute walk makes a difference. Any kind of exercise can lower stress and improve your mood ? just pick activities that you enjoy and make it a regular habit. 3. Eat Well and Limit Alcohol and Stimulants Alcohol, nicotine and caffeine may temporarily relieve stress but have negative health impacts and can make stress worse in the long run. Well-nourished bodies cope better, so start with a good breakfast, add more organic fruits and vegetables for a well-balanced diet, avoid processed foods and sugar, try herbal tea and drink more water. 4. Connect with Supportive People Talking face to face with another person releases hormones that reduce stress. Lean on those good listeners in your life. 5. [place] Time Do you enjoy gardening, reading, listening to music or some other creative pursuit? Engage in activities that bring you pleasure and joy; research shows that reduces stress by almost half and lowers your heart rate, too. 6. Practice Meditation, Stress Reduction or Yoga Relaxation techniques activate a state of restfulness that counterbalances your body's fight-or-flight hormones. Even if this also means a 10-minute break in a long day: listen to music, read, go for a walk in nature, do a hobby, take a bath or spend time with a friend. Also consider taking a mindfulness-based stress reduction course to learn effective, lasting tools or try a daily deep breathing or imagery practice. Deep Breathing Slow, calm and deep breathing can help you relax. Try these steps to focus on your breathing and repeat as needed. Find a comfortable position and close your eyes. Exhale and drop your shoulders. Breathe in through your nose; fill your lungs and then your belly. Think of relaxing your body, quieting your mind and becoming calm and  peaceful. Breathe out slowly through your nose, relaxing your belly. Think of releasing tension, pain, worries or distress. Repeat steps three and four until you feel relaxed. Imagery This involves using your mind to excite the senses -- sound, vision, smell, taste and feeling. This may help ease your stress. Begin by getting comfortable and then do some slow breathing. Imagine a place you love being at. It could be somewhere from your childhood, somewhere you vacationed or just a place in your imagination. Feel how it is to be in the place you're imagining. Pay attention to the sounds, air, colors, and who is there with you. This is a place where you feel cared for and loved. All is well. You are safe. Take in all the smells, sounds, tastes and feelings. As you do, feel your body being nourished and healed. Feel the calm that surrounds you. Breathe in all  the good. Breathe out any discomfort or tension. 7. Sleep Enough If you get less than seven to eight hours of sleep, your body won't tolerate stress as well as it could. If stress keeps you up at night, address the cause and add extra meditation into your day to make up for the lost z's. Try to get seven to nine hours of sleep each night. Make a regular bedtime schedule. Keep your room dark and cool. Try to avoid computers, TV, cell phones and tablets before bed. 8. Bond with Connections You Enjoy Go out for a coffee with a friend, chat with a neighbor, call a family member, visit with a clergy member, or even hang out with your pet. Clinical studies show that spending even a short time with a companion animal can cut anxiety levels almost in half. 9. Take a Vacation Getting away from it all can reset your stress tolerance by increasing your mental and emotional outlook, which makes you a happier, more productive person upon return. Leave your cellphone and laptop at home! 10. See a Counselor, Coach or Therapist If negative thoughts overwhelm  your ability to make positive changes, it's time to seek professional help. Make an appointment today--your health and life are worth it.  Patient Goals/Self-Care Activities: Over the next 120 days Attend scheduled medical appointments Utilize healthy coping skills and supportive resources discussed Contact PCP with any questions or concerns  Follow up goal     Follow up:  Patient agrees to Care Plan and Follow-up.  Plan: The Managed Medicaid care management team will reach out to the patient again over the next 45 days.  Date/time of next scheduled Social Work care management/care coordination outreach:  05/18/22 at 2:45 pm.  Eula Fried, BSW, MSW, [place] Medicaid LCSW [email or web address] Phone: [phone]

## 2022-04-13 NOTE — Patient Instructions (Signed)
Visit Information  [name] was given information about Medicaid Managed Care team care coordination services as a part of their Healthy [facility] Medicaid benefit. Jasmine Gordon verbally consented to engagement with the [facility] Managed Care team.   If you are experiencing a medical emergency, please call 911 or report to your local emergency department or urgent care.   If you have a non-emergency medical problem during routine business hours, please contact your provider's office and ask to speak with a nurse.   For questions related to your Healthy [facility] health plan, please call: [phone] or visit the homepage here: [email or web address]  If you would like to schedule transportation through your Healthy [facility] plan, please call the following number at least 2 days in advance of your appointment: [phone]  For information about your ride after you set it up, call Ride Assist at [phone]. Use this number to activate a Will Call pickup, or if your transportation is late for a scheduled pickup. Use this number, too, if you need to make a change or cancel a previously scheduled reservation.  If you need transportation services right away, call [phone]. The after-hours call center is staffed 24 hours to handle ride assistance and urgent reservation requests (including discharges) 365 days a year. Urgent trips include sick visits, hospital discharge requests and life-sustaining treatment.  Call the [place] at [phone], at any time, 24 hours a day, 7 days a week. If you are in danger or need immediate medical attention call 911.  If you would like help to quit smoking, call 1-800-QUIT-NOW 604-583-9155) OR Espaol: 1-855-Djelo-Ya HD:1601594) o para ms informacin haga clic aqu or Text READY to 200-400 to register via text  Following is a copy of your plan of care:  Care Plan : LCSW Plan of  Care  Updates made by Greg Cutter, LCSW since 04/13/2022 12:00 AM     Problem: Anxiety Identification (Anxiety)      Long-Range Goal: To find a long term counselor in order to learn how to alleviate my anxiety and stress   Start Date: 02/14/2022  Priority: High  Note:   Priority: High  Timeframe:  Long-Range Goal Priority:  High Start Date:   02/14/22                Expected End Date:  ongoing                     Follow Up Date--05/18/22 at 2:45 pm  - check out counseling - keep 90 percent of counseling appointments - schedule counseling appointment    Why is this important?             Beating depression may take some time.            If you don't feel better right away, don't give up on your treatment plan.    Current barriers:   Chronic Mental Health needs related to anxiety, panic and stress management  Mental Health Concerns  and Social Isolation Needs Support, Education, and Care Coordination in order to meet unmet mental health needs.  Clinical Goal(s): verbalize understanding of plan for management of Anxiety, Panic and Stress    When your car dies or a deadline looms, how do you respond? Long-term, low-grade or acute stress takes a serious toll on your body and mind, so don't ignore feelings of constant tension. Stress is a natural part of life. However, too much stress can harm  our health, especially if it continues every day. This is chronic stress and can put you at risk for heart problems like heart disease and depression. Understand what's happening inside your body and learn simple coping skills to combat the negative impacts of everyday stressors. Types of Stress There are two types of stress: Emotional - types of emotional stress are relationship problems, pressure at work, financial worries, experiencing discrimination or having a major life change. Physical - Examples of physical stress include being sick having pain, not sleeping well, recovery from an injury  or having an alcohol and drug use disorder. Fight or Flight Sudden or ongoing stress activates your nervous system and floods your bloodstream with adrenaline and cortisol, two hormones that raise blood pressure, increase heart rate and spike blood sugar. These changes pitch your body into a fight or flight response. That enabled our ancestors to outrun saber-toothed tigers, and it's helpful today for situations like dodging a car accident. But most modern chronic  stressors,such as finances or a challenging relationship, keep your body in that heightened state, which hurts your health. Effects of Too Much Stress If constantly under stress, most of [place] will eventually start to function less well.  Multiple studies link chronic stress to a higher risk of heart disease, stroke, depression, weight gain, memory loss and even premature death, so it's important to recognize the warning signals. Talk to your doctor about ways to manage stress if you're experiencing any of these symptoms: Prolonged periods of poor sleep. Regular, severe headaches. Unexplained weight loss or gain. Feelings of isolation, withdrawal or worthlessness. Constant anger and irritability. Loss of interest in activities. Constant worrying or obsessive thinking. Excessive alcohol or drug use. Inability to concentrate.  10 Ways to Cope with Chronic Stress It's key to recognize stressful situations as they occur because it allows you to focus on managing how you react. We all need to know when to close our eyes and take a deep breath when we feel tension rising. Use these tips to prevent or reduce chronic stress. 1. Rebalance Work and Home All work and no play? If you're spending too much time at the office, intentionally put more dates in your calendar to enjoy time for fun, either alone or with others. 2. Get Regular Exercise Moving your body on a regular basis balances the nervous system and increases blood circulation, helping  to flush out stress hormones. Even a daily 20-minute walk makes a difference. Any kind of exercise can lower stress and improve your mood ? just pick activities that you enjoy and make it a regular habit. 3. Eat Well and Limit Alcohol and Stimulants Alcohol, nicotine and caffeine may temporarily relieve stress but have negative health impacts and can make stress worse in the long run. Well-nourished bodies cope better, so start with a good breakfast, add more organic fruits and vegetables for a well-balanced diet, avoid processed foods and sugar, try herbal tea and drink more water. 4. Connect with Supportive People Talking face to face with another person releases hormones that reduce stress. Lean on those good listeners in your life. 5. [place] Time Do you enjoy gardening, reading, listening to music or some other creative pursuit? Engage in activities that bring you pleasure and joy; research shows that reduces stress by almost half and lowers your heart rate, too. 6. Practice Meditation, Stress Reduction or Yoga Relaxation techniques activate a state of restfulness that counterbalances your body's fight-or-flight hormones. Even if this also means a  10-minute break in a long day: listen to music, read, go for a walk in nature, do a hobby, take a bath or spend time with a friend. Also consider taking a mindfulness-based stress reduction course to learn effective, lasting tools or try a daily deep breathing or imagery practice. Deep Breathing Slow, calm and deep breathing can help you relax. Try these steps to focus on your breathing and repeat as needed. Find a comfortable position and close your eyes. Exhale and drop your shoulders. Breathe in through your nose; fill your lungs and then your belly. Think of relaxing your body, quieting your mind and becoming calm and peaceful. Breathe out slowly through your nose, relaxing your belly. Think of releasing tension, pain, worries or  distress. Repeat steps three and four until you feel relaxed. Imagery This involves using your mind to excite the senses -- sound, vision, smell, taste and feeling. This may help ease your stress. Begin by getting comfortable and then do some slow breathing. Imagine a place you love being at. It could be somewhere from your childhood, somewhere you vacationed or just a place in your imagination. Feel how it is to be in the place you're imagining. Pay attention to the sounds, air, colors, and who is there with you. This is a place where you feel cared for and loved. All is well. You are safe. Take in all the smells, sounds, tastes and feelings. As you do, feel your body being nourished and healed. Feel the calm that surrounds you. Breathe in all the good. Breathe out any discomfort or tension. 7. Sleep Enough If you get less than seven to eight hours of sleep, your body won't tolerate stress as well as it could. If stress keeps you up at night, address the cause and add extra meditation into your day to make up for the lost z's. Try to get seven to nine hours of sleep each night. Make a regular bedtime schedule. Keep your room dark and cool. Try to avoid computers, TV, cell phones and tablets before bed. 8. Bond with Connections You Enjoy Go out for a coffee with a friend, chat with a neighbor, call a family member, visit with a clergy member, or even hang out with your pet. Clinical studies show that spending even a short time with a companion animal can cut anxiety levels almost in half. 9. Take a Vacation Getting away from it all can reset your stress tolerance by increasing your mental and emotional outlook, which makes you a happier, more productive person upon return. Leave your cellphone and laptop at home! 10. See a Counselor, Coach or Therapist If negative thoughts overwhelm your ability to make positive changes, it's time to seek professional help. Make an appointment today--your health and  life are worth it.  Patient Goals/Self-Care Activities: Over the next 120 days Attend scheduled medical appointments Utilize healthy coping skills and supportive resources discussed Contact PCP with any questions or concerns  Follow up goal  Eula Fried, BSW, MSW, [employer] Managed Medicaid [email or web address] [email or web address] Phone: [phone]

## 2022-04-13 NOTE — Patient Instructions (Addendum)
Start fluoxetine 20 mg daily  Discontinue sertraline  Continue hydroxyzine 25 mg daily as needed for anxiety  Next appointment: 6/29 at 11:30AM, in person  The next visit will be in person visit. Please arrive 15 mins before the scheduled time.   [facility]  Address: [address], [place], [place] [address]

## 2022-04-21 ENCOUNTER — Emergency Department: Payer: Medicaid Other

## 2022-04-21 ENCOUNTER — Emergency Department
Admission: EM | Admit: 2022-04-21 | Discharge: 2022-04-22 | Disposition: A | Discharge status: Home / Self Care | Payer: Medicaid Other | Attending: Emergency Medicine | Admitting: Emergency Medicine

## 2022-04-21 DIAGNOSIS — F419 Anxiety disorder, unspecified: Secondary | ICD-10-CM | POA: Diagnosis not present

## 2022-04-21 DIAGNOSIS — R0789 Other chest pain: Secondary | ICD-10-CM | POA: Diagnosis not present

## 2022-04-21 DIAGNOSIS — R079 Chest pain, unspecified: Secondary | ICD-10-CM | POA: Diagnosis not present

## 2022-04-21 DIAGNOSIS — R0602 Shortness of breath: Secondary | ICD-10-CM | POA: Diagnosis not present

## 2022-04-21 LAB — BASIC METABOLIC PANEL
Anion gap: 8 (ref 5–15)
BUN: 13 mg/dL (ref 6–20)
CO2: 22 mmol/L (ref 22–32)
Calcium: 9.3 mg/dL (ref 8.9–10.3)
Chloride: 107 mmol/L (ref 98–111)
Creatinine, Ser: 0.74 mg/dL (ref 0.44–1.00)
GFR, Estimated: >60 mL/min (ref >60)
Glucose, Bld: 94 mg/dL (ref 70–99)
Potassium: 3.5 mmol/L (ref 3.5–5.1)
Sodium: 137 mmol/L (ref 135–145)

## 2022-04-21 LAB — CBC
HCT: 35.8 % — ABNORMAL LOW (ref 36.0–46.0)
Hemoglobin: 11.2 g/dL — ABNORMAL LOW (ref 12.0–15.0)
MCH: 26.6 pg (ref 26.0–34.0)
MCHC: 31.3 g/dL (ref 30.0–36.0)
MCV: 85 fL (ref 80.0–100.0)
Platelets: 218 10*3/uL (ref 150–400)
RBC: 4.21 MIL/uL (ref 3.87–5.11)
RDW: 12.9 % (ref 11.5–15.5)
WBC: 5.5 10*3/uL (ref 4.0–10.5)
nRBC: 0 % (ref 0.0–0.2)

## 2022-04-21 LAB — TROPONIN I (HIGH SENSITIVITY)
Troponin I (High Sensitivity): <2 ng/L (ref <18)
Troponin I (High Sensitivity): <2 ng/L (ref <18)

## 2022-04-21 LAB — POC URINE PREG, ED: Preg Test, Ur: NEGATIVE

## 2022-04-21 NOTE — ED Provider Notes (Signed)
[facility] Provider Note    Event Date/Time   First MD Initiated Contact with Patient 04/21/22 2331     (approximate)   History   Chest Pain   HPI {Remember to add pertinent medical, surgical, social, and/or OB history to HPI:1} Jasmine Gordon is a 36 y.o. female  ***       Physical Exam   Triage Vital Signs: ED Triage Vitals [04/21/22 2006]  Enc Vitals Group     BP (!) 133/99     Pulse Rate 86     Resp 16     Temp 98.1 F (36.7 C)     Temp Source Oral     SpO2 100 %     Weight 108.4 kg (239 lb)     Height 1.702 m (5\' 7" )     Head Circumference      Peak Flow      Pain Score 6     Pain Loc      Pain Edu?      Excl. in [place]?     Most recent vital signs: Vitals:   04/21/22 2006 04/21/22 2223  BP: (!) 133/99 120/69  Pulse: 86 85  Resp: 16 18  Temp: 98.1 F (36.7 C)   SpO2: 100% 100%    {Only need to document appropriate and relevant physical exam:1} General: Awake, no distress. *** CV:  Good peripheral perfusion. *** Resp:  Normal effort. *** Abd:  No distention. *** Other:  ***   ED Results / Procedures / Treatments   Labs (all labs ordered are listed, but only abnormal results are displayed) Labs Reviewed  CBC - Abnormal; Notable for the following components:      Result Value   Hemoglobin 11.2 (*)    HCT 35.8 (*)    All other components within normal limits  BASIC METABOLIC PANEL  POC URINE PREG, ED  TROPONIN I (HIGH SENSITIVITY)  TROPONIN I (HIGH SENSITIVITY)     EKG  ED ECG REPORT I, Hinda Kehr, the attending physician, personally viewed and interpreted this ECG.  Date: 04/21/2022 EKG Time: 20: 03 Rate: 79 Rhythm: normal sinus rhythm with sinus arrhythmia QRS Axis: normal Intervals: normal ST/T Wave abnormalities: normal Narrative Interpretation: no evidence of acute ischemia    RADIOLOGY I viewed and interpreted the patient's 2 view chest x-ray.  I see no evidence of acute infection.   The radiologist commented on some peribronchial thickening consistent with a viral process.  No lobar pneumonia.    PROCEDURES:  Critical Care performed: {CriticalCareYesNo:19197::"Yes, see critical care procedure note(s)","No"}  Procedures   MEDICATIONS ORDERED IN ED: Medications - No data to display   IMPRESSION / MDM / [place] / ED COURSE  I reviewed the triage vital signs and the nursing notes.                              Differential diagnosis includes, but is not limited to, ***  Patient's presentation is most consistent with {EM COPA:27473}  {If the patient is on the monitor, remove the brackets and asterisks on the sentence below and remember to document it as a Procedure as well. Otherwise delete the sentence below:1} {**The patient is on the cardiac monitor to evaluate for evidence of arrhythmia and/or significant heart rate changes.**} {Remember to include, when applicable, any/all of the following data: independent review of imaging independent review of labs (comment specifically on  pertinent positives and negatives) review of specific prior hospitalizations, PCP/specialist notes, etc. discuss meds given and prescribed document any discussion with consultants (including hospitalists) any clinical decision tools you used and why (PECARN, NEXUS, etc.) did you consider admitting the patient? document social determinants of health affecting patient's care (homelessness, inability to follow up in a timely fashion, etc) document any pre-existing conditions increasing risk on current visit (e.g. diabetes and HTN increasing danger of high-risk chest pain/ACS) describes what meds you gave (especially parenteral) and why any other interventions?:1}     FINAL CLINICAL IMPRESSION(S) / ED DIAGNOSES   Final diagnoses:  None     Rx / DC Orders   ED Discharge Orders     None        Note:  This document was prepared using Dragon voice recognition  software and may include unintentional dictation errors.

## 2022-04-21 NOTE — ED Triage Notes (Signed)
Pt presents via POV c/o chest pain and SOB. Reports started yesterday while having 4 teeth pulled by the dentist. Reports hx anxiety.

## 2022-04-22 ENCOUNTER — Encounter: Payer: Self-pay | Admitting: Family Medicine

## 2022-04-22 NOTE — ED Notes (Signed)
E-signature pad unavailable - Pt verbalized understanding of D/C information - no additional concerns at this time.  

## 2022-04-22 NOTE — Discharge Instructions (Addendum)
You have been seen in the Emergency Department (ED) today for chest pain and associated symptoms.  As we have discussed today's test results are normal, and we feel it is likely that panic attacks and anxiety are the cause of your symptoms.  Please follow up with the recommended doctor as instructed above in these documents regarding today's emergent visit and your recent symptoms to discuss further management.  Return to the Emergency Department (ED) if you experience any further chest pain/pressure/tightness, difficulty breathing, or sudden sweating, or other symptoms that concern you.

## 2022-04-25 ENCOUNTER — Telehealth: Payer: Self-pay

## 2022-04-25 NOTE — Telephone Encounter (Signed)
Transition Care Management Unsuccessful Follow-up Telephone Call  Date of discharge and from where:  04/22/2022 from [facility]  Attempts:  1st Attempt  Reason for unsuccessful TCM follow-up call:  Left voice message

## 2022-04-26 NOTE — Telephone Encounter (Signed)
Transition Care Management Follow-up Telephone Call Date of discharge and from where:  04/22/2022 from [facility] How have you been since you were released from the hospital? Patient stated that she is feeling better. Patient did not have any questions or concerns.  Any questions or concerns? No  Items Reviewed: Did the pt receive and understand the discharge instructions provided? Yes  Medications obtained and verified? Yes  Other? No  Any new allergies since your discharge? No  Dietary orders reviewed? No Do you have support at home? Yes   Functional Questionnaire: (I = Independent and D = Dependent) ADLs: I  Bathing/Dressing- I  Meal Prep- I  Eating- I  Maintaining continence- I  Transferring/Ambulation- I  Managing Meds- I   Follow up appointments reviewed:  PCP Hospital f/u appt confirmed? No   Specialist Hospital f/u appt confirmed? Yes  Scheduled to see [facility] on 05/16/2022 @ 8:00am. Are transportation arrangements needed? No  If their condition worsens, is the pt aware to call PCP or go to the Emergency Dept.? Yes Was the patient provided with contact information for the PCP's office or ED? Yes Was to pt encouraged to call back with questions or concerns? Yes

## 2022-05-12 ENCOUNTER — Other Ambulatory Visit: Payer: Self-pay | Admitting: Obstetrics and Gynecology

## 2022-05-12 NOTE — Patient Instructions (Signed)
Hi [name], nice to speak with you today-have a nice day today!  [name] was given information about Medicaid Managed Care team care coordination services as a part of their Healthy [facility] benefit. [name] verbally consented to engagement with the [facility] Managed Care team.   If you are experiencing a medical emergency, please call 911 or report to your local emergency department or urgent care.   If you have a non-emergency medical problem during routine business hours, please contact your provider's office and ask to speak with a nurse.   For questions related to your Healthy [facility] health plan, please call: [phone] or visit the homepage here: [email or web address]  If you would like to schedule transportation through your Healthy [facility] plan, please call the following number at least 2 days in advance of your appointment: [phone]  For information about your ride after you set it up, call Ride Assist at [phone]. Use this number to activate a Will Call pickup, or if your transportation is late for a scheduled pickup. Use this number, too, if you need to make a change or cancel a previously scheduled reservation.  If you need transportation services right away, call [phone]. The after-hours call center is staffed 24 hours to handle ride assistance and urgent reservation requests (including discharges) 365 days a year. Urgent trips include sick visits, hospital discharge requests and life-sustaining treatment.  Call the [facility] at [phone], at any time, 24 hours a day, 7 days a week. If you are in danger or need immediate medical attention call 911.  If you would like help to quit smoking, call 1-800-QUIT-NOW (917-296-9482) OR Espaol: 1-855-Djelo-Ya (1-856-314-9702) o para ms informacin haga clic aqu or Text READY to 637-858 to register via text  [name] following are the goals we discussed in your visit today:   Goals Addressed    Long-Range Goal: Establish Plan of Care for Chronic Disease Management Needs   Priority: High  Note:   Timeframe:  Long-Range Goal Priority:  High Start Date:   03/07/22                          Expected End Date:     ongoing                  Follow Up Date 06/20/22   - schedule appointment for flu shot - schedule appointment for vaccines needed due to my age or health - schedule recommended health tests (blood work, mammogram, colonoscopy, pap test) - schedule and keep appointment for annual check-up    Why is this important?   Screening tests can find diseases early when they are easier to treat.  Your doctor or nurse will talk with you about which tests are important for you.  Getting shots for common diseases like the flu and shingles will help prevent them.  05/12/22:  Patient has upcoming appointments with Psychiatry and counselor   Patient verbalizes understanding of instructions and care plan provided today and agrees to view in MyChart. Active MyChart status and patient understanding of how to access instructions and care plan via MyChart confirmed with patient.     The Managed Medicaid care management team will reach out to the patient again over the next 30 business  days.  The  Patient has been provided with contact information for the Managed Medicaid care management team and has been advised to call  with any health related questions or concerns.   [name] RN, BSN [place]  [facility] Care Management Coordinator - Managed Medicaid High Risk [phone]   Following is a copy of your plan of care:  Care Plan : RN Care Manager Plan of Care  Updates made by [name], RN since 05/12/2022 12:00 AM     Problem: Health Promotion or Disease Self-Management (General Plan of Care)      Long-Range Goal: Chronic Disease Management and Care Coordination Needs    Expected End Date: 08/12/2022  Priority: High  Note:   Current Barriers:  Knowledge Deficits related to plan of care for management of Asthma, anxiety  Chronic Disease Management support and education needs related to Asthma, anxiety 05/12/22:  Patient with continued anxiety with work-continues to see Psychiatrist and counselor, has appointments next week-encouragement given  to continue appointments.  Asthma controlled.  RNCM Clinical Goal(s):  Patient will verbalize understanding of plan for management of Asthma, anxiety as evidenced by patient report verbalize basic understanding of  Asthma disease process and self health management plan as evidenced by patient report take all medications exactly as prescribed and will call provider for medication related questions as evidenced by patient report demonstrate understanding of rationale for each prescribed medication as evidenced by patient report attend all scheduled medical appointments: as evidenced by patient report continue to work with RN Care Manager to address care management and care coordination needs related to  Asthma as evidenced by adherence to CM Team Scheduled appointments work with social worker to address  related to the management of Mental Health Concerns  related to the management of Anxiety, Depression, and PTSD, panic attacks as evidenced by review of EMR and patient or social worker report experience decrease in ED visits as evidenced by EMR review.  ED visits in in last 6 months = 13 through collaboration with RN Care manager, provider, and care team.   Interventions: Inter-disciplinary care team collaboration (see longitudinal plan of care) Evaluation of current treatment plan related to  self management and patient's adherence to plan as established by provider  Asthma: (Status:New goal.) Long Term Goal Advised patient to track and manage Asthma triggers Advised patient to self assesses Asthma action plan zone and  make appointment with provider if in the yellow zone for 48 hours without improvement Discussed the importance of adequate rest and management of fatigue with Asthma Assessed social determinant of health barriers  Collaborated with SW SW referral for anxiety/depression/panic attacks-completed  Patient Goals/Self-Care Activities: Take all medications as prescribed Attend all scheduled provider appointments Call pharmacy for medication refills 3-7 days in advance of running out of medications Perform all self care activities independently  Perform IADL's (shopping, preparing meals, housekeeping, managing finances) independently Call provider office for new concerns or questions  Work with the social worker to address care coordination needs and will continue to work with the clinical team to address health care and disease management related needs Patient to contact Pharmacy and/or Provider regarding medication  Follow Up Plan:  The patient has been provided with contact information for the care management team and has been advised to call with any health related questions or concerns.  The care management team will reach out to the patient again over the next 30 business  days.

## 2022-05-12 NOTE — Patient Outreach (Signed)
Medicaid Managed Care   Nurse Care Manager Note  05/12/2022 Name:  Jasmine Gordon MRN:  [id number] DOB:  10-Jul-1986  Jasmine Gordon is an 36 y.o. year old female who is a primary patient of [name], DO.  The [facility] Managed Care Coordination team was consulted for assistance with:    Chronic healthcare management needs, asthma, anxiety/depression/panic attacks, PTSD  Jasmine Gordon was given information about Medicaid Managed Care Coordination team services today. Jasmine Gordon Patient agreed to services and verbal consent obtained.  Engaged with patient by telephone for follow up visit in response to provider referral for case management and/or care coordination services.   Assessments/Interventions:  Review of past medical history, allergies, medications, health status, including review of consultants reports, laboratory and other test data, was performed as part of comprehensive evaluation and provision of chronic care management services.  SDOH (Social Determinants of Health) assessments and interventions performed: SDOH Interventions    Flowsheet Row Most Recent Value  SDOH Interventions   Food Insecurity Interventions Intervention Not Indicated  Physical Activity Interventions Other (Comments)  [patient doesn't have time between work and taking care of children]     Care Plan  Allergies  Allergen Reactions   Reglan [Metoclopramide] Hives and Shortness Of Breath   Contrave [Naltrexone-Bupropion Hcl Er] Other (See Comments)    Had some reaction to the medication   Medications Reviewed Today     Reviewed by [name], RN (Registered Nurse) on 05/12/22 at [phone]  Med List Status: <None>   Medication Order Taking? Sig Documenting Provider Last Dose Status Informant  albuterol (VENTOLIN HFA) 108 (90 Base) MCG/ACT inhaler [id number]  Inhale 2 puffs into the lungs every 6 (six) hours as needed for wheezing or shortness of breath. Karamalegos,  Netta Neat, DO  Active   cetirizine (ZYRTEC) 10 MG tablet [id number]  Take 10 mg by mouth daily as needed for allergies. [provider]  Active   chlorhexidine (PERIDEX) 0.12 % solution [id number]  Use as directed 10 mLs in the mouth or throat. [provider]  Active   FLUoxetine (PROZAC) 20 MG capsule [id number] No Take 1 capsule (20 mg total) by mouth daily.  Patient not taking: Reported on 05/12/2022   Neysa Hotter, MD Not Taking Active   fluticasone (FLONASE) 50 MCG/ACT nasal spray [id number]  Place 1 spray into both nostrils daily. Use for 4-6 weeks then stop and use seasonally or as needed. Lorre Munroe, NP  Active   hydrOXYzine (ATARAX) 10 MG tablet [id number]  Take 1 tablet (10 mg total) by mouth 3 (three) times daily as needed for anxiety. Karamalegos, Netta Neat, DO  Active   montelukast (SINGULAIR) 10 MG tablet [id number]  Take 10 mg by mouth at bedtime as needed. [provider]  Active   OVER THE COUNTER MEDICATION [id number]  [place] gummy 1/2 daily [provider]  Active   Spacer/Aero-Holding Chambers (AEROCHAMBER MV) inhaler [id number]  Use as instructed [name], DO  Active   Vitamin D, Ergocalciferol, (DRISDOL) 1.25 MG (50000 UNIT) CAPS capsule [id number]  Take 1 capsule (50,000 Units total) by mouth every 7 (seven) days. [name], DO  Active            Patient Active Problem List   Diagnosis Date Noted   Palpitations 01/21/2022   Shortness of breath 01/21/2022   Chest pain 01/21/2022   Generalized anxiety disorder with panic attacks 09/27/2021   Environmental and seasonal allergies  11/04/2020   Genital warts 09/11/2018   Positive TB test 09/25/2017   Allergic contact dermatitis due to metals 05/17/2017   Pre-diabetes 03/06/2017   Hyperlipidemia 03/06/2017   Allergic reaction 02/08/2017   Urticaria due to food allergy 02/08/2017   Vitamin D deficiency 11/08/2016   Iron deficiency anemia due  to chronic blood loss 10/03/2016   Menorrhagia with regular cycle 06/27/2016   Mild persistent asthma without complication 06/17/2016   Anxiety and depression 06/17/2016   Morbid obesity (HCC) 06/17/2016   PTSD (post-traumatic stress disorder) 2016   Conditions to be addressed/monitored per PCP order:  Chronic healthcare management needs, asthma, anxiety/depression/panic attacks, PTSD, HLD  Care Plan : RN Care Manager Plan of Care  Updates made by [name], RN since 05/12/2022 12:00 AM     Problem: Health Promotion or Disease Self-Management (General Plan of Care)      Long-Range Goal: Chronic Disease Management and Care Coordination Needs   Expected End Date: 08/12/2022  Priority: High  Note:   Current Barriers:  Knowledge Deficits related to plan of care for management of Asthma, anxiety  Chronic Disease Management support and education needs related to Asthma, anxiety 05/12/22:  Patient with continued anxiety with work-continues to see Psychiatrist and counselor, has appointments next week-encouragement given  to continue appointments.  Asthma controlled.  RNCM Clinical Goal(s):  Patient will verbalize understanding of plan for management of Asthma, anxiety as evidenced by patient report verbalize basic understanding of  Asthma disease process and self health management plan as evidenced by patient report take all medications exactly as prescribed and will call provider for medication related questions as evidenced by patient report demonstrate understanding of rationale for each prescribed medication as evidenced by patient report attend all scheduled medical appointments: as evidenced by patient report continue to work with RN Care Manager to address care management and care coordination needs related to  Asthma as evidenced by adherence to CM Team Scheduled appointments work with social worker to address  related to the management of Mental Health Concerns  related to the  management of Anxiety, Depression, and PTSD, panic attacks as evidenced by review of EMR and patient or social worker report experience decrease in ED visits as evidenced by EMR review.  ED visits in in last 6 months = 13 through collaboration with RN Care manager, provider, and care team.   Interventions: Inter-disciplinary care team collaboration (see longitudinal plan of care) Evaluation of current treatment plan related to  self management and patient's adherence to plan as established by provider  Asthma: (Status:New goal.) Long Term Goal Advised patient to track and manage Asthma triggers Advised patient to self assesses Asthma action plan zone and make appointment with provider if in the yellow zone for 48 hours without improvement Discussed the importance of adequate rest and management of fatigue with Asthma Assessed social determinant of health barriers  Collaborated with SW SW referral for anxiety/depression/panic attacks-completed  Patient Goals/Self-Care Activities: Take all medications as prescribed Attend all scheduled provider appointments Call pharmacy for medication refills 3-7 days in advance of running out of medications Perform all self care activities independently  Perform IADL's (shopping, preparing meals, housekeeping, managing finances) independently Call provider office for new concerns or questions  Work with the social worker to address care coordination needs and will continue to work with the clinical team to address health care and disease management related needs Patient to contact Pharmacy and/or Provider regarding medication  Follow Up Plan:  The patient  has been provided with contact information for the care management team and has been advised to call with any health related questions or concerns.  The care management team will reach out to the patient again over the next 30 business  days.    Long-Range Goal: Establish Plan of Care for Chronic Disease  Management Needs   Priority: High  Note:   Timeframe:  Long-Range Goal Priority:  High Start Date:   03/07/22                          Expected End Date:     ongoing                  Follow Up Date 06/20/22   - schedule appointment for flu shot - schedule appointment for vaccines needed due to my age or health - schedule recommended health tests (blood work, mammogram, colonoscopy, pap test) - schedule and keep appointment for annual check-up    Why is this important?   Screening tests can find diseases early when they are easier to treat.  Your doctor or nurse will talk with you about which tests are important for you.  Getting shots for common diseases like the flu and shingles will help prevent them.  05/12/22:  Patient has upcoming appointments with Psychiatry and counselor   Follow Up:  Patient agrees to Care Plan and Follow-up.  Plan: The Managed Medicaid care management team will reach out to the patient again over the next 30 business  days. and The  Patient has been provided with contact information for the Managed Medicaid care management team and has been advised to call with any health related questions or concerns.  Date/time of next scheduled RN care management/care coordination outreach:  06/20/22 at 0900
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
## 2022-05-16 ENCOUNTER — Ambulatory Visit (INDEPENDENT_AMBULATORY_CARE_PROVIDER_SITE_OTHER): Payer: Medicaid Other | Admitting: Licensed Clinical Social Worker

## 2022-05-16 DIAGNOSIS — F410 Panic disorder [episodic paroxysmal anxiety] without agoraphobia: Secondary | ICD-10-CM | POA: Diagnosis not present

## 2022-05-16 DIAGNOSIS — F411 Generalized anxiety disorder: Secondary | ICD-10-CM

## 2022-05-16 DIAGNOSIS — F4310 Post-traumatic stress disorder, unspecified: Secondary | ICD-10-CM | POA: Diagnosis not present

## 2022-05-16 NOTE — Plan of Care (Signed)
  Problem: PTSD-Trauma Disorder Goal: LTG: Reduce the negative impact trauma related symptoms have on social, occupational, and family functioning per pt self report 3 out of 5 sessions documented.  Outcome: Progressing Goal: STG: Reduction in intrusive event recollections, avoidance of event reminders, intense arousal, or disinterest in activities or relationships. 3 out of 5 sessions documented Outcome: Progressing Intervention: Encourage verbalization of feelings/concerns/expectations Note: Explored  Intervention: Assist with relaxation techniques, as appropriate (deep breathing exercises, meditation, guided imagery) Note: reviewed

## 2022-05-18 ENCOUNTER — Encounter: Payer: Self-pay | Admitting: Medical

## 2022-05-18 ENCOUNTER — Other Ambulatory Visit
Admission: RE | Admit: 2022-05-18 | Discharge: 2022-05-18 | Disposition: A | Discharge status: Home / Self Care | Payer: Medicaid Other | Attending: Medical | Admitting: Medical

## 2022-05-18 ENCOUNTER — Other Ambulatory Visit: Payer: Self-pay

## 2022-05-18 ENCOUNTER — Ambulatory Visit: Payer: Medicaid Other | Admitting: Medical

## 2022-05-18 VITALS — BP 110/80 | HR 110 | Ht 67.0 in | Wt 243.0 lb

## 2022-05-18 DIAGNOSIS — I10 Essential (primary) hypertension: Secondary | ICD-10-CM

## 2022-05-18 DIAGNOSIS — R002 Palpitations: Secondary | ICD-10-CM

## 2022-05-18 DIAGNOSIS — R072 Precordial pain: Secondary | ICD-10-CM | POA: Diagnosis not present

## 2022-05-18 LAB — BASIC METABOLIC PANEL
Anion gap: 6 (ref 5–15)
BUN: 16 mg/dL (ref 6–20)
CO2: 25 mmol/L (ref 22–32)
Calcium: 9.2 mg/dL (ref 8.9–10.3)
Chloride: 107 mmol/L (ref 98–111)
Creatinine, Ser: 0.79 mg/dL (ref 0.44–1.00)
GFR, Estimated: >60 mL/min (ref >60)
Glucose, Bld: 97 mg/dL (ref 70–99)
Potassium: 3.9 mmol/L (ref 3.5–5.1)
Sodium: 138 mmol/L (ref 135–145)

## 2022-05-18 MED ORDER — PANTOPRAZOLE SODIUM 40 MG PO TBEC
40.0000 mg | DELAYED_RELEASE_TABLET | Freq: Every day | ORAL | 5 refills | Status: DC
Start: 2022-05-18 — End: 2023-02-23

## 2022-05-18 MED ORDER — METOPROLOL TARTRATE 25 MG PO TABS: 12.5000 mg | ORAL_TABLET | Freq: Two times a day (BID) | ORAL | 0 refills | Status: DC

## 2022-05-18 NOTE — Patient Outreach (Signed)
Medicaid Managed Care Social Work Note  05/18/2022 Name:  Jasmine Gordon MRN:  [id number] DOB:  Apr 18, 1986  Jasmine Gordon is an 36 y.o. year old female who is a primary patient of Jasmine Cords, DO.  The Medicaid Managed Care Coordination team was consulted for assistance with:  Mental Health Counseling and Resources  Jasmine Gordon was given information about Medicaid Managed Care Coordination team services today. Jasmine Gordon Patient agreed to services and verbal consent obtained.  Engaged with patient  for by telephone forfollow up visit in response to referral for case management and/or care coordination services.   Assessments/Interventions:  Review of past medical history, allergies, medications, health status, including review of consultants reports, laboratory and other test data, was performed as part of comprehensive evaluation and provision of chronic care management services.  SDOH: (Social Determinant of Health) assessments and interventions performed: SDOH Interventions    Flowsheet Row Most Recent Value  SDOH Interventions   Stress Interventions Offered [employer] Resources       Advanced Directives Status:  See Care Plan for related entries.  Care Plan                 Allergies  Allergen Reactions   Reglan [Metoclopramide] Hives and Shortness Of Breath   Contrave [Naltrexone-Bupropion Hcl Er] Other (See Comments)    Had some reaction to the medication    Medications Reviewed Today     Reviewed by Jasmine Bryant, LCSW (Social Worker) on 05/18/22 at 1310  Med List Status: <None>   Medication Order Taking? Sig Documenting Provider Last Dose Status Informant  albuterol (VENTOLIN HFA) 108 (90 Base) MCG/ACT inhaler [id number] No Inhale 2 puffs into the lungs every 6 (six) hours as needed for wheezing or shortness of breath. Jasmine Cords, DO Taking Active   cetirizine (ZYRTEC) 10 MG tablet [id number] No Take 10 mg  by mouth daily as needed for allergies. Provider, Historical, Gordon Taking Active   chlorhexidine (PERIDEX) 0.12 % solution [id number] No Use as directed 10 mLs in the mouth or throat. Provider, Historical, Gordon Taking Active   FLUoxetine (PROZAC) 20 MG capsule [id number] No Take 1 capsule (20 mg total) by mouth daily.  Patient not taking: Reported on 05/12/2022   Jasmine Gordon Not Taking Active   fluticasone [facility]) 50 MCG/ACT nasal spray [id number] No Place 1 spray into both nostrils daily. Use for 4-6 weeks then stop and use seasonally or as needed. Jasmine Munroe, NP Taking Active   hydrOXYzine (ATARAX) 10 MG tablet [id number] No Take 1 tablet (10 mg total) by mouth 3 (three) times daily as needed for anxiety. Karamalegos, Netta Neat, DO Taking Active   montelukast (SINGULAIR) 10 MG tablet [id number] No Take 10 mg by mouth at bedtime as needed. Provider, Historical, Gordon Taking Active   OVER THE COUNTER MEDICATION [id number] No Goli ashwaganda gummy 1/2 daily Provider, Historical, Gordon Taking Active   Spacer/Aero-Holding Chambers (AEROCHAMBER MV) inhaler [id number] No Use as instructed Jasmine Cords, DO Taking Active   Vitamin D, Ergocalciferol, (DRISDOL) 1.25 MG (50000 UNIT) CAPS capsule [id number] No Take 1 capsule (50,000 Units total) by mouth every 7 (seven) days. Jasmine Cords, DO Taking Active             Patient Active Problem List   Diagnosis Date Noted   Palpitations 01/21/2022   Shortness of breath 01/21/2022   Chest pain 01/21/2022   Generalized anxiety disorder with panic attacks 09/27/2021  Environmental and seasonal allergies 11/04/2020   Genital warts 09/11/2018   Positive TB test 09/25/2017   Allergic contact dermatitis due to metals 05/17/2017   Pre-diabetes 03/06/2017   Hyperlipidemia 03/06/2017   Allergic reaction 02/08/2017   Urticaria due to food allergy 02/08/2017   Vitamin D deficiency 11/08/2016   Iron deficiency anemia due to chronic blood  loss 10/03/2016   Menorrhagia with regular cycle 06/27/2016   Mild persistent asthma without complication 0000000   Anxiety and depression 06/17/2016   Morbid obesity ([place]) 06/17/2016   PTSD (post-traumatic stress disorder) 2016    Conditions to be addressed/monitored per PCP order:  [place] : LCSW Plan of Care  Updates made by Jasmine Cutter, LCSW since 05/18/2022 12:00 AM     Problem: Anxiety Identification (Anxiety)      Long-Range Goal: To find a long term counselor in order to learn how to alleviate my anxiety and stress   Start Date: 02/14/2022  Priority: High  Note:   Priority: High  Timeframe:  Long-Range Goal Priority:  High Start Date:   02/14/22                Expected End Date:  05/18/22  Follow Up Date---Goal completed as of 05/18/22. Patient has been connected to a counselor at [employer].   - check out counseling - keep 90 percent of counseling appointments - schedule counseling appointment    Why is this important?             Beating depression may take some time.            If you don't feel better right away, don't give up on your treatment plan.    Current barriers:   Chronic Mental Health needs related to anxiety, panic and stress management  Mental Health Concerns  and Social Isolation Needs Support, Education, and Care Coordination in order to meet unmet mental health needs.  Clinical Goal(s): verbalize understanding of plan for management of Anxiety, Panic and Stress     Clinical Interventions:  Assessed patient's previous and current treatment, coping skills, support system and barriers to care. Patient and spouse provided hx  Verbalization of feelings encouraged, motivational interviewing employed Emotional support provided, positive coping strategies explored Self care/establishing healthy boundaries emphasized Patient reports that her anxiety has increased which has affected her ability to carry out work and function daily.  Patient  receives strong support from spouse Patient is agreeable to referral to [facility] for counseling. Patient has already started psychiatry treatment there. [facility] LCSW made referral on 02/14/22. Update 03/14/22- Patient continues to receive psychiatry from [facility] but they do not have any available therapist for counseling. Patient is in need of counseling as her stress has increased since she had to start back work last week. She reports that her disability is still processing and she is very frustrated with this process. [facility] LCSW sent patient a secure email with [facility] contact information. She is agreeable to contact them to schedule an intake appointment for counseling. Patient did not want [facility]. She reports that she cannot talk long because she is back at work today and experiencing extreme stress. Emotional support provided to patient. UPDATE 05/18/22- Patient completed her first counseling session at [facility] where she receives psychiatry services at. Patient reports that her mental health has improved drastically and her counselor suggested a follow up counseling appointment in August. Her counseling session was on 05/16/22. She has a psychiatry appointment tomorrow. She reports  being satisfied with her current mental health support network team. She ask that [facility] LCSW email her with contact information in case any mental health concerns arise in the future. Patient is agreeable to social work case closure. [facility] LCSW will update [facility] RNCM.  Patient recently started back work which has negatively affected her mental health.  Patient reports that she experiences both anxiety and panic attacks. She shares that she felt alone most days until she joined an anxiety management support group on Facebook. She was receptive to anxiety and depression management coping skill education. LCSW provided education on relaxation techniques such as meditation, deep breathing, massage, grounding exercsies or yoga that can activate the  body's relaxation response and ease symptoms of stress and anxiety. LCSW ask that when pt is struggling with difficult emotions and racing thoughts that they start this relaxation response process. LCSW provided extensive education on healthy coping skills for anxiety. SW used active and reflective listening, validated patient's feelings/concerns, and provided emotional support.  Motivational Interviewing employed Depression screen reviewed  PHQ2/ PHQ9 completed Mindfulness or Relaxation training provided Active listening / Reflection utilized  Emotional Support Provided Problem Solving /Task Center strategies reviewed Provided psychoeducation for mental health needs  Provided brief CBT  Reviewed mental health medications and discussed importance of compliance:  Quality of sleep assessed & Sleep Hygiene techniques promoted  Participation in counseling encouraged  Verbalization of feelings encouraged  Suicidal Ideation/Homicidal Ideation assessed: Patient denies SI/HI  Review resources, discussed options and provided patient information about  Mental Health Resources Inter-disciplinary care team collaboration (see longitudinal plan of care) UPDATE 04/13/22- Patient has completed counseling session at [facility] and has upcoming appointment on 05/16/22. However, patient was a no show for her psychiatry appointment and did not reschedule this. [facility] LCSW completed joint call with patient to [facility] and was able to schedule her psychiatry appointment for today at 4:00 pm. This appointment will be virtual. [facility] LCSW emailed patient a list of coping skills for anxiety, depression and stress which include the following: When your car dies or a deadline looms, how do you respond? Long-term, low-grade or acute stress takes a serious toll on your body and mind, so don't ignore feelings of constant tension. Stress is a natural part of life. However, too much stress can harm our health, especially if it continues every  day. This is chronic stress and can put you at risk for heart problems like heart disease and depression. Understand what's happening inside your body and learn simple coping skills to combat the negative impacts of everyday stressors. Types of Stress There are two types of stress: Emotional - types of emotional stress are relationship problems, pressure at work, financial worries, experiencing discrimination or having a major life change. Physical - Examples of physical stress include being sick having pain, not sleeping well, recovery from an injury or having an alcohol and drug use disorder. Fight or Flight Sudden or ongoing stress activates your nervous system and floods your bloodstream with adrenaline and cortisol, two hormones that raise blood pressure, increase heart rate and spike blood sugar. These changes pitch your body into a fight or flight response. That enabled our ancestors to outrun saber-toothed tigers, and it's helpful today for situations like dodging a car accident. But most modern chronic stressors, such as finances or a challenging relationship, keep your body in that heightened state, which hurts your health. Effects of Too Much Stress If constantly under stress, most of [place] will eventually start to function less  well.  Multiple studies link chronic stress to a higher risk of heart disease, stroke, depression, weight gain, memory loss and even premature death, so it's important to recognize the warning signals. Talk to your doctor about ways to manage stress if you're experiencing any of these symptoms: Prolonged periods of poor sleep. Regular, severe headaches. Unexplained weight loss or gain. Feelings of isolation, withdrawal or worthlessness. Constant anger and irritability. Loss of interest in activities. Constant worrying or obsessive thinking. Excessive alcohol or drug use. Inability to concentrate.  10 Ways to Cope with Chronic Stress It's key to recognize  stressful situations as they occur because it allows you to focus on managing how you react. We all need to know when to close our eyes and take a deep breath when we feel tension rising. Use these tips to prevent or reduce chronic stress. 1. Rebalance Work and Home All work and no play? If you're spending too much time at the office, intentionally put more dates in your calendar to enjoy time for fun, either alone or with others. 2. Get Regular Exercise Moving your body on a regular basis balances the nervous system and increases blood circulation, helping to flush out stress hormones. Even a daily 20-minute walk makes a difference. Any kind of exercise can lower stress and improve your mood ? just pick activities that you enjoy and make it a regular habit. 3. Eat Well and Limit Alcohol and Stimulants Alcohol, nicotine and caffeine may temporarily relieve stress but have negative health impacts and can make stress worse in the long run. Well-nourished bodies cope better, so start with a good breakfast, add more organic fruits and vegetables for a well-balanced diet, avoid processed foods and sugar, try herbal tea and drink more water. 4. Connect with Supportive People Talking face to face with another person releases hormones that reduce stress. Lean on those good listeners in your life. 5. Carve Out Hobby Time Do you enjoy gardening, reading, listening to music or some other creative pursuit? Engage in activities that bring you pleasure and joy; research shows that reduces stress by almost half and lowers your heart rate, too. 6. Practice Meditation, Stress Reduction or Yoga Relaxation techniques activate a state of restfulness that counterbalances your body's fight-or-flight hormones. Even if this also means a 10-minute break in a long day: listen to music, read, go for a walk in nature, do a hobby, take a bath or spend time with a friend. Also consider taking a mindfulness-based stress reduction  course to learn effective, lasting tools or try a daily deep breathing or imagery practice. Deep Breathing Slow, calm and deep breathing can help you relax. Try these steps to focus on your breathing and repeat as needed. Find a comfortable position and close your eyes. Exhale and drop your shoulders. Breathe in through your nose; fill your lungs and then your belly. Think of relaxing your body, quieting your mind and becoming calm and peaceful. Breathe out slowly through your nose, relaxing your belly. Think of releasing tension, pain, worries or distress. Repeat steps three and four until you feel relaxed. Imagery This involves using your mind to excite the senses -- sound, vision, smell, taste and feeling. This may help ease your stress. Begin by getting comfortable and then do some slow breathing. Imagine a place you love being at. It could be somewhere from your childhood, somewhere you vacationed or just a place in your imagination. Feel how it is to be in the place you're imagining.  Pay attention to the sounds, air, colors, and who is there with you. This is a place where you feel cared for and loved. All is well. You are safe. Take in all the smells, sounds, tastes and feelings. As you do, feel your body being nourished and healed. Feel the calm that surrounds you. Breathe in all the good. Breathe out any discomfort or tension. 7. Sleep Enough If you get less than seven to eight hours of sleep, your body won't tolerate stress as well as it could. If stress keeps you up at night, address the cause and add extra meditation into your day to make up for the lost z's. Try to get seven to nine hours of sleep each night. Make a regular bedtime schedule. Keep your room dark and cool. Try to avoid computers, TV, cell phones and tablets before bed. 8. Bond with Connections You Enjoy Go out for a coffee with a friend, chat with a neighbor, call a family member, visit with a clergy member, or even  hang out with your pet. Clinical studies show that spending even a short time with a companion animal can cut anxiety levels almost in half. 9. Take a Vacation Getting away from it all can reset your stress tolerance by increasing your mental and emotional outlook, which makes you a happier, more productive person upon return. Leave your cellphone and laptop at home! 10. See a Counselor, Coach or Therapist If negative thoughts overwhelm your ability to make positive changes, it's time to seek professional help. Make an appointment today--your health and life are worth it.  Patient Goals/Self-Care Activities: Over the next 120 days Attend scheduled medical appointments Utilize healthy coping skills and supportive resources discussed Contact PCP with any questions or concerns  Follow up goal     Follow up:  Patient requests no follow-up at this time.  Plan: The Managed Medicaid care management team is available to follow up with the patient after provider conversation with the patient regarding recommendation for care management engagement and subsequent re-referral to the care management team.   Dickie La, BSW, MSW, LCSW Managed Medicaid LCSW [facility]  [facility] [email or web address] Phone: [phone]

## 2022-05-18 NOTE — Patient Instructions (Addendum)
Medication Instructions:  Your physician has recommended you make the following change in your medication:   START Protonix 40 mg daily. An Rx has been sent to your pharmacy  START Metoprolol 12.5 mg (1/2 tablet) twice a day. An Rx has been sent to your pharmacy   2 hours prior to your Cardiac CTA take 100 mg (4 tablets) of your Metoprolol .  *If you need a refill on your cardiac medications before your next appointment, please call your pharmacy*   Lab Work: Bmp today  Please have your lab drawn at the [facility]. Stop at the Registration desk to check in.  If you have labs (blood work) drawn today and your tests are completely normal, you will receive your results only by: MyChart Message (if you have MyChart) OR A paper copy in the mail If you have any lab test that is abnormal or we need to change your treatment, we will call you to review the results.   Testing/Procedures: Your physician has requested that you have cardiac CT. Cardiac computed tomography (CT) is a painless test that uses an x-ray machine to take clear, detailed pictures of your heart. For further information please visit [email or web address] Please follow instruction sheet as given.     Follow-Up: At [facility], you and your health needs are our priority.  As part of our continuing mission to provide you with exceptional heart care, we have created designated Provider Care Teams.  These Care Teams include your primary Cardiologist (physician) and Advanced Practice Providers (APPs -  Physician Assistants and Nurse Practitioners) who all work together to provide you with the care you need, when you need it.  We recommend signing up for the patient portal called "MyChart".  Sign up information is provided on this After Visit Summary.  MyChart is used to connect with patients for Virtual Visits (Telemedicine).  Patients are able to view lab/test results, encounter notes, upcoming appointments, etc.  Non-urgent  messages can be sent to your provider as well.   To learn more about what you can do with MyChart, go to [email or web address].    Your next appointment:   4 week(s)  The format for your next appointment:   In Person  Provider:   You may see Yvonne Kendall, MD or one of the following Advanced Practice Providers on your designated Care Team:   Nicolasa Ducking, NP Eula Listen, PA-C Cadence Fransico Michael, [place]   Other Instructions   Your cardiac CT will be scheduled at one of the below location:   On Mon 05/23/22 @ 2:30 pm  If scheduled at [facility], please arrive 15 mins early for check-in and test prep.  [facility] [address] [place], [place] [address] [phone]     Please follow these instructions carefully (unless otherwise directed):   On the Night Before the Test: Be sure to Drink plenty of water. Do not consume any caffeinated/decaffeinated beverages or chocolate 12 hours prior to your test.  On the Day of the Test: Drink plenty of water until 1 hour prior to the test. Do not eat any food 4 hours prior to the test. You may take your regular medications prior to the test.  Take metoprolol (Lopressor) two hours prior to test. FEMALES- please wear underwire-free bra if available, avoid dresses & tight clothing   *      After the Test: Drink plenty of water. After receiving IV contrast, you may experience a mild  flushed feeling. This is normal. On occasion, you may experience a mild rash up to 24 hours after the test. This is not dangerous. If this occurs, you can take Benadryl 25 mg and increase your fluid intake. If you experience trouble breathing, this can be serious. If it is severe call 911 IMMEDIATELY. If it is mild, please call our office. If you take any of these medications: Glipizide/Metformin, Avandament, Glucavance, please do not take 48 hours after completing test unless  otherwise instructed.  We will call to schedule your test 2-4 weeks out understanding that some insurance companies will need an authorization prior to the service being performed.   For non-scheduling related questions, please contact the cardiac imaging nurse navigator should you have any questions/concerns: Rockwell [place], Cardiac Imaging Nurse Navigator Larey Brick, Cardiac Imaging Nurse Navigator [facility] Direct Office Dial: [phone]   For scheduling needs, including cancellations and rescheduling, please call [place], [phone].   Important Information About Sugar

## 2022-05-18 NOTE — Progress Notes (Signed)
Cardiology Office Note:    Date:  05/18/2022   ID:  Jasmine Gordon, DOB 04/30/1986, MRN [id number]  PCP:  [name], DO  [facility] Cardiologist:  None  [facility] Electrophysiologist:  None   Referring MD: [name] *   Chief Complaint: palpitations/chest discomfort  History of Present Illness:    Jasmine Gordon is a 36 y.o. female with a hx of HLD, iron deficiency anemia, asthma, obesity, panic attacks and PTSD who presents for palpitations.   Saw Dr. Okey Dupre in May 2023 for chest pain and palpitations. An echo and heart monitor were ordered.   Echo 01/2021 showed LVEF 55-60% with normal wall motion and diastolic function.  Normal RV size and function.  Normal PA pressure.  Normal biatrial size.  Mild tricuspid regurgitation.  Normal CVP. Heart monitor showed predominantly sinus rhythm with rare PACs and PVCs. She was recommended as needed follow-up.   ER visit 6/1 for atypical chest pain. BP was mildly elevated. Hs trop was negative x 2.   Today, the patient called in for palpitations. Has felt off all week. Today she felt her heart rate in her throat. It only lasted a couple seconds. Yesterday she ate spicy food. Head also feels woozy. Also has low appetite. Unsure if this is anxiety. She works for [employer] and there is no AC. EKG shows HR 110bpm however pulse Ox with Heart rate 100bpm.   Past Medical History:  Diagnosis Date   Allergy    Anemia    Anxiety    Asthma    Migraine    Obese    Panic attack    PTSD (post-traumatic stress disorder) 2016    Past Surgical History:  Procedure Laterality Date   APPENDECTOMY     LEEP  12/2020   CIN II, negative margins   Plantar warts     WISDOM TOOTH EXTRACTION      Current Medications: Current Meds  Medication Sig   albuterol (VENTOLIN HFA) 108 (90 Base) MCG/ACT inhaler Inhale 2 puffs into the lungs every 6 (six) hours as needed for wheezing or shortness of breath.   cetirizine  (ZYRTEC) 10 MG tablet Take 10 mg by mouth daily as needed for allergies.   chlorhexidine (PERIDEX) 0.12 % solution Use as directed 10 mLs in the mouth or throat.   fluticasone (FLONASE) 50 MCG/ACT nasal spray Place 1 spray into both nostrils daily. Use for 4-6 weeks then stop and use seasonally or as needed.   hydrOXYzine (ATARAX) 10 MG tablet Take 1 tablet (10 mg total) by mouth 3 (three) times daily as needed for anxiety.   metoprolol tartrate (LOPRESSOR) 25 MG tablet Take 0.5 tablets (12.5 mg total) by mouth 2 (two) times daily.   montelukast (SINGULAIR) 10 MG tablet Take 10 mg by mouth at bedtime as needed.   Multiple Vitamins-Minerals (ONE-A-DAY WOMENS PO) Take 1 tablet by mouth daily.   OVER THE COUNTER MEDICATION Goli ashwaganda gummy 1/2 daily   pantoprazole (PROTONIX) 40 MG tablet Take 1 tablet (40 mg total) by mouth daily.   Spacer/Aero-Holding Chambers (AEROCHAMBER MV) inhaler Use as instructed   Vitamin D, Ergocalciferol, (DRISDOL) 1.25 MG (50000 UNIT) CAPS capsule Take 1 capsule (50,000 Units total) by mouth every 7 (seven) days.     Allergies:   Reglan [metoclopramide] and Contrave [naltrexone-bupropion hcl er]   Social History   Socioeconomic History   Marital status: Married    Spouse name: Not on file   Number of  children: Not on file   Years of education: Not on file   Highest education level: Not on file  Occupational History   Not on file  Tobacco Use   Smoking status: Never   Smokeless tobacco: Never  Vaping Use   Vaping Use: Never used  Substance and Sexual Activity   Alcohol use: Not Currently   Drug use: No   Sexual activity: Yes    Birth control/protection: Condom  Other Topics Concern   Not on file  Social History Narrative   Not on file   Social Determinants of Health   Financial Resource Strain: Not on file  Food Insecurity: No Food Insecurity (05/12/2022)   Hunger Vital Sign    Worried About Running Out of Food in the Last Year: Never true     Ran Out of Food in the Last Year: Never true  Transportation Needs: No Transportation Needs (03/07/2022)   PRAPARE - Administrator, Civil Service (Medical): No    Lack of Transportation (Non-Medical): No  Physical Activity: Inactive (05/12/2022)   Exercise Vital Sign    Days of Exercise per Week: 0 days    Minutes of Exercise per Session: 0 min  Stress: No Stress Concern Present (05/18/2022)   [employer] of Occupational Health - Occupational Stress Questionnaire    Feeling of Stress : Only a little  Recent Concern: Stress - Stress Concern Present (04/13/2022)   [employer] of Occupational Health - Occupational Stress Questionnaire    Feeling of Stress : Very much  Social Connections: Not on file     Family History: The patient's family history includes Alcohol abuse in her mother; Bipolar disorder in her mother; Depression in her mother; Diabetes in her father, maternal grandfather, and mother; Drug abuse in her mother; Heart disease in her father; Heart failure in her sister; Mental retardation in her mother; Stroke in her maternal grandfather.  ROS:   Please see the history of present illness.     All other systems reviewed and are negative.  EKGs/Labs/Other Studies Reviewed:    The following studies were reviewed today:  Echo 01/2022   1. Left ventricular ejection fraction, by estimation, is 55 to 60%. The  left ventricle has normal function. The left ventricle has no regional  wall motion abnormalities. Left ventricular diastolic parameters were  normal.   2. Right ventricular systolic function is normal. The right ventricular  size is normal. There is normal pulmonary artery systolic pressure. The  estimated right ventricular systolic pressure is 23.8 mmHg.   3. The mitral valve is normal in structure. No evidence of mitral valve  regurgitation. No evidence of mitral stenosis.   4. The aortic valve is normal in structure. Aortic valve regurgitation is   not visualized. No aortic stenosis is present.   5. The inferior vena cava is normal in size with greater than 50%  respiratory variability, suggesting right atrial pressure of 3 mmHg.  Heart monitor 01/2022 The patient was monitored for 13 days, 13 hours. The predominant rhythm was sinus with an average rate of 91 bpm (range 53-170 bpm). There were rare PAC's and PVC's. No sustained arrhythmia or prolonged pause was observed. Patient triggered events correspond to sinus rhythm as well as rare isolated PAC's and PVC's.   Predominantly sinus rhythm with rare PAC's and PVC's.  EKG:  EKG is  ordered today.  The ekg ordered today demonstrates ST 110bpm, nonspecific T wave changes  Recent Labs: 01/04/2022: TSH 1.396  03/23/2022: ALT 10 04/21/2022: BUN 13; Creatinine, Ser 0.74; Hemoglobin 11.2; Platelets 218; Potassium 3.5; Sodium 137  Recent Lipid Panel    Component Value Date/Time   CHOL 224 (H) 09/07/2021 0958   TRIG 77 09/07/2021 0958   HDL 42 (L) 09/07/2021 0958   CHOLHDL 5.3 (H) 09/07/2021 0958   VLDL 11 06/07/2017 0921   LDLCALC 164 (H) 09/07/2021 0958     Physical Exam:    VS:  BP 110/80 (BP Location: Left Arm, Patient Position: Sitting, Cuff Size: Large)   Pulse (!) 110   Ht 5\' 7"  (1.702 m)   Wt 243 lb (110.2 kg)   LMP 04/12/2022   SpO2 100%   BMI 38.06 kg/m     Wt Readings from Last 3 Encounters:  05/18/22 243 lb (110.2 kg)  04/21/22 239 lb (108.4 kg)  03/23/22 240 lb (108.9 kg)     GEN:  Well nourished, well developed in no acute distress HEENT: Normal NECK: No JVD; No carotid bruits LYMPHATICS: No lymphadenopathy CARDIAC: RRR, no murmurs, rubs, gallops RESPIRATORY:  Clear to auscultation without rales, wheezing or rhonchi  ABDOMEN: Soft, non-tender, non-distended MUSCULOSKELETAL:  No edema; No deformity  SKIN: Warm and dry NEUROLOGIC:  Alert and oriented x 3 PSYCHIATRIC:  Normal affect   ASSESSMENT:    1. Precordial pain   2. Palpitations   3.  Essential hypertension    PLAN:    In order of problems listed above:  Atypical chest pain Reports chest tightness after episode of palpitations, unsure if it was GERD. Low suspicion for ACS. Recent ER visit for atypical chest pain with negative work-up. Prior echo earlier this year was normal. EKG today showed ST, 110bpm with  nonspecific T wave changes. I will order a Cardiac CTA. I will also start Protonix 40mg  daily. Suspect anxiety is contributing to symptoms.   Palpitations Reports intermittent palpitations. EKG shows ST with heart rate of 110bpm. During the visit heart rate around 100bpm. Prior heart monitor showed predominately NSR with PACs and PVCs. I will start Lorpessor 12.5mg  BID and see if symptoms improve.   HTN BP is good today. Start Lopressor as above.   Disposition: Follow up in 1 month(s) with MD/APP      Signed, Markeis Allman 05/23/22, PA-C  05/18/2022 3:51 PM    [facility]

## 2022-05-18 NOTE — Patient Instructions (Signed)
Visit Information  Ms. Giovanni Koke was given information about Medicaid Managed Care team care coordination services as a part of their Healthy [facility] Medicaid benefit. Leata Eryana Crompton verbally consented to engagement with the [facility] Managed Care team.   If you are experiencing a medical emergency, please call 911 or report to your local emergency department or urgent care.   If you have a non-emergency medical problem during routine business hours, please contact your provider's office and ask to speak with a nurse.   For questions related to your Healthy [facility] health plan, please call: [phone] or visit the homepage here: [email or web address]  If you would like to schedule transportation through your Healthy [facility] plan, please call the following number at least 2 days in advance of your appointment: [phone]  For information about your ride after you set it up, call Ride Assist at [phone]. Use this number to activate a Will Call pickup, or if your transportation is late for a scheduled pickup. Use this number, too, if you need to make a change or cancel a previously scheduled reservation.  If you need transportation services right away, call [phone]. The after-hours call center is staffed 24 hours to handle ride assistance and urgent reservation requests (including discharges) 365 days a year. Urgent trips include sick visits, hospital discharge requests and life-sustaining treatment.  Call the [place] at [phone], at any time, 24 hours a day, 7 days a week. If you are in danger or need immediate medical attention call 911.  If you would like help to quit smoking, call 1-800-QUIT-NOW 847 648 2460) OR Espaol: 1-855-Djelo-Ya QO:409462) o para ms informacin haga clic aqu or Text READY to 200-400 to register via text  Following is a copy of your plan of care:  Care Plan : LCSW Plan of  Care  Updates made by Greg Cutter, LCSW since 05/18/2022 12:00 AM     Problem: Anxiety Identification (Anxiety)      Long-Range Goal: To find a long term counselor in order to learn how to alleviate my anxiety and stress   Start Date: 02/14/2022  Priority: High  Note:   Priority: High  Timeframe:  Long-Range Goal Priority:  High Start Date:   02/14/22                Expected End Date:  05/18/22  Follow Up Date---Goal completed as of 05/18/22. Patient has been connected to a counselor at [facility].   - check out counseling - keep 90 percent of counseling appointments - schedule counseling appointment    Why is this important?             Beating depression may take some time.            If you don't feel better right away, don't give up on your treatment plan.    Current barriers:   Chronic Mental Health needs related to anxiety, panic and stress management  Mental Health Concerns  and Social Isolation Needs Support, Education, and Care Coordination in order to meet unmet mental health needs.  Clinical Goal(s): verbalize understanding of plan for management of Anxiety, Panic and Stress     Types of Stress There are two types of stress: Emotional - types of emotional stress are relationship problems, pressure at work, financial worries, experiencing discrimination or having a major life change. Physical - Examples of physical stress include being sick having pain, not sleeping well, recovery from an injury or having an alcohol  and drug use disorder. Fight or Flight Sudden or ongoing stress activates your nervous system and floods your bloodstream with adrenaline and cortisol, two hormones that raise blood pressure, increase heart rate and spike blood sugar. These changes pitch your body into a fight or flight response. That enabled our ancestors to outrun saber-toothed tigers, and it's helpful today for situations like dodging a car accident. But most modern chronic stressors, such as  finances or a challenging relationship, keep your body in that heightened state, which hurts your health. Effects of Too Much Stress If constantly under stress, most of [place] will eventually start to function less well.  Multiple studies link chronic stress to a higher risk of heart disease, stroke, depression, weight gain, memory loss and even premature death, so it's important to recognize the warning signals. Talk to your doctor about ways to manage stress if you're experiencing any of these symptoms: Prolonged periods of poor sleep. Regular, severe headaches. Unexplained weight loss or gain. Feelings of isolation, withdrawal or worthlessness. Constant anger and irritability. Loss of interest in activities. Constant worrying or obsessive thinking. Excessive alcohol or drug use. Inability to concentrate.  10 Ways to Cope with Chronic Stress It's key to recognize stressful situations as they occur because it allows you to focus on managing how you react. We all need to know when to close our eyes and take a deep breath when we feel tension rising. Use these tips to prevent or reduce chronic stress. 1. Rebalance Work and Home All work and no play? If you're spending too much time at the office, intentionally put more dates in your calendar to enjoy time for fun, either alone or with others. 2. Get Regular Exercise Moving your body on a regular basis balances the nervous system and increases blood circulation, helping to flush out stress hormones. Even a daily 20-minute walk makes a difference. Any kind of exercise can lower stress and improve your mood ? just pick activities that you enjoy and make it a regular habit. 3. Eat Well and Limit Alcohol and Stimulants Alcohol, nicotine and caffeine may temporarily relieve stress but have negative health impacts and can make stress worse in the long run. Well-nourished bodies cope better, so start with a good breakfast, add more organic fruits and  vegetables for a well-balanced diet, avoid processed foods and sugar, try herbal tea and drink more water. 4. Connect with Supportive People Talking face to face with another person releases hormones that reduce stress. Lean on those good listeners in your life. 5. Carve Out Hobby Time Do you enjoy gardening, reading, listening to music or some other creative pursuit? Engage in activities that bring you pleasure and joy; research shows that reduces stress by almost half and lowers your heart rate, too. 6. Practice Meditation, Stress Reduction or Yoga Relaxation techniques activate a state of restfulness that counterbalances your body's fight-or-flight hormones. Even if this also means a 10-minute break in a long day: listen to music, read, go for a walk in nature, do a hobby, take a bath or spend time with a friend. Also consider taking a mindfulness-based stress reduction course to learn effective, lasting tools or try a daily deep breathing or imagery practice. Deep Breathing Slow, calm and deep breathing can help you relax. Try these steps to focus on your breathing and repeat as needed. Find a comfortable position and close your eyes. Exhale and drop your shoulders. Breathe in through your nose; fill your lungs and then your belly.  Think of relaxing your body, quieting your mind and becoming calm and peaceful. Breathe out slowly through your nose, relaxing your belly. Think of releasing tension, pain, worries or distress. Repeat steps three and four until you feel relaxed. Imagery This involves using your mind to excite the senses -- sound, vision, smell, taste and feeling. This may help ease your stress. Begin by getting comfortable and then do some slow breathing. Imagine a place you love being at. It could be somewhere from your childhood, somewhere you vacationed or just a place in your imagination. Feel how it is to be in the place you're imagining. Pay attention to the sounds, air,  colors, and who is there with you. This is a place where you feel cared for and loved. All is well. You are safe. Take in all the smells, sounds, tastes and feelings. As you do, feel your body being nourished and healed. Feel the calm that surrounds you. Breathe in all the good. Breathe out any discomfort or tension. 7. Sleep Enough If you get less than seven to eight hours of sleep, your body won't tolerate stress as well as it could. If stress keeps you up at night, address the cause and add extra meditation into your day to make up for the lost z's. Try to get seven to nine hours of sleep each night. Make a regular bedtime schedule. Keep your room dark and cool. Try to avoid computers, TV, cell phones and tablets before bed. 8. Bond with Connections You Enjoy Go out for a coffee with a friend, chat with a neighbor, call a family member, visit with a clergy member, or even hang out with your pet. Clinical studies show that spending even a short time with a companion animal can cut anxiety levels almost in half. 9. Take a Vacation Getting away from it all can reset your stress tolerance by increasing your mental and emotional outlook, which makes you a happier, more productive person upon return. Leave your cellphone and laptop at home! 10. See a Counselor, Coach or Therapist If negative thoughts overwhelm your ability to make positive changes, it's time to seek professional help. Make an appointment today--your health and life are worth it.  Patient Goals/Self-Care Activities: Over the next 120 days Attend scheduled medical appointments Utilize healthy coping skills and supportive resources discussed Contact PCP with any questions or concerns  Follow up goal  Dickie La, BSW, MSW, [employer] Managed Medicaid LCSW [facility]  [facility] [email or web address] Phone: [phone]

## 2022-05-19 ENCOUNTER — Ambulatory Visit: Payer: Medicaid Other | Admitting: Psychiatry

## 2022-05-23 ENCOUNTER — Ambulatory Visit: Admission: RE | Admit: 2022-05-23 | Payer: Medicaid Other | Source: Ambulatory Visit

## 2022-06-07 ENCOUNTER — Emergency Department: Payer: Medicaid Other

## 2022-06-07 ENCOUNTER — Other Ambulatory Visit: Payer: Self-pay

## 2022-06-07 ENCOUNTER — Encounter: Payer: Self-pay | Admitting: Family Medicine

## 2022-06-07 ENCOUNTER — Emergency Department
Admission: EM | Admit: 2022-06-07 | Discharge: 2022-06-07 | Disposition: A | Discharge status: Home / Self Care | Payer: Medicaid Other | Attending: Emergency Medicine | Admitting: Emergency Medicine

## 2022-06-07 DIAGNOSIS — F419 Anxiety disorder, unspecified: Secondary | ICD-10-CM | POA: Insufficient documentation

## 2022-06-07 DIAGNOSIS — J45909 Unspecified asthma, uncomplicated: Secondary | ICD-10-CM | POA: Diagnosis not present

## 2022-06-07 DIAGNOSIS — R442 Other hallucinations: Secondary | ICD-10-CM | POA: Diagnosis not present

## 2022-06-07 DIAGNOSIS — R5383 Other fatigue: Secondary | ICD-10-CM | POA: Diagnosis not present

## 2022-06-07 DIAGNOSIS — R4182 Altered mental status, unspecified: Secondary | ICD-10-CM | POA: Diagnosis not present

## 2022-06-07 DIAGNOSIS — G4700 Insomnia, unspecified: Secondary | ICD-10-CM | POA: Diagnosis not present

## 2022-06-07 DIAGNOSIS — D500 Iron deficiency anemia secondary to blood loss (chronic): Secondary | ICD-10-CM

## 2022-06-07 LAB — POC URINE PREG, ED: Preg Test, Ur: NEGATIVE

## 2022-06-07 LAB — CBC WITH DIFFERENTIAL/PLATELET
Abs Immature Granulocytes: 0.02 10*3/uL (ref 0.00–0.07)
Basophils Absolute: 0 10*3/uL (ref 0.0–0.1)
Basophils Relative: 1 %
Eosinophils Absolute: 0.1 10*3/uL (ref 0.0–0.5)
Eosinophils Relative: 1 %
HCT: 36.8 % (ref 36.0–46.0)
Hemoglobin: 11.6 g/dL — ABNORMAL LOW (ref 12.0–15.0)
Immature Granulocytes: 0 %
Lymphocytes Relative: 33 %
Lymphs Abs: 1.8 10*3/uL (ref 0.7–4.0)
MCH: 26.5 pg (ref 26.0–34.0)
MCHC: 31.5 g/dL (ref 30.0–36.0)
MCV: 84 fL (ref 80.0–100.0)
Monocytes Absolute: 0.3 10*3/uL (ref 0.1–1.0)
Monocytes Relative: 6 %
Neutro Abs: 3.1 10*3/uL (ref 1.7–7.7)
Neutrophils Relative %: 59 %
Platelets: 228 10*3/uL (ref 150–400)
RBC: 4.38 MIL/uL (ref 3.87–5.11)
RDW: 13.1 % (ref 11.5–15.5)
WBC: 5.3 10*3/uL (ref 4.0–10.5)
nRBC: 0 % (ref 0.0–0.2)

## 2022-06-07 LAB — BASIC METABOLIC PANEL
Anion gap: 6 (ref 5–15)
BUN: 11 mg/dL (ref 6–20)
CO2: 24 mmol/L (ref 22–32)
Calcium: 9.3 mg/dL (ref 8.9–10.3)
Chloride: 108 mmol/L (ref 98–111)
Creatinine, Ser: 0.79 mg/dL (ref 0.44–1.00)
GFR, Estimated: >60 mL/min (ref >60)
Glucose, Bld: 98 mg/dL (ref 70–99)
Potassium: 3.8 mmol/L (ref 3.5–5.1)
Sodium: 138 mmol/L (ref 135–145)

## 2022-06-07 LAB — TSH: TSH: 2.975 u[IU]/mL (ref 0.350–4.500)

## 2022-06-07 LAB — T4, FREE: Free T4: 0.8 ng/dL (ref 0.61–1.12)

## 2022-06-07 NOTE — ED Triage Notes (Signed)
Pt to ED, tearful, states has appt tomorrow with neurologist but was scared to wait until tomorrow because this morning after waking up around 0900 she had strange symptoms and felt concerned for stroke. She smelled paper burning, but there was nothing burning. This has been happening on and off all week. States feels weak and feels off. States this morning was in grocery store and was "in a daze" for a little while.  States has been under stress this week also and has been having severe mood swings. States period started this morning. States feels overwhelmed and emotional. Also complains of insomnia since 1 week.  PA at bedside talking to pt. States has hx depression, PTSD and anxiety. Has therapist she sees.

## 2022-06-07 NOTE — ED Provider Notes (Signed)
[facility] Provider Note    Event Date/Time   First MD Initiated Contact with Patient 06/07/22 1853     (approximate)   History   Fatigue and Insomnia   HPI  Jasmine Gordon is a 36 y.o. female  with pmh PTSD, anxiety asthma migraines presents with multiple different symptoms.  She notes that over the last several months and experiencing worsening PMS.  Last week she felt extremely overwhelmed and depressed.  She recently quit her job because it was too stressful.  She is feeling better from emotional standpoint.  Denies suicidality.  Today she smelled burning papers when there was not actually anything burning and no one else could still smell.  Denies visual change numbness tingling weakness.  Does have chronic headaches this is unchanged.  Denies headache currently.  She also endorses while at the grocery store today she felt somewhat dazed and like she was unable to concentrate when talking to someone.  She denies abdominal pain nausea vomiting chest pain or shortness of breath.  She is on medication for anxiety sees a psychiatrist.  She has a neurology appointment tomorrow for chronic headaches.  She is also seeing cardiology for cardiac work-up which is presumed to be noncardiac but to schedule for a coronary CTA     Past Medical History:  Diagnosis Date   Allergy    Anemia    Anxiety    Asthma    Migraine    Obese    Panic attack    PTSD (post-traumatic stress disorder) 2016    Patient Active Problem List   Diagnosis Date Noted   Palpitations 01/21/2022   Shortness of breath 01/21/2022   Chest pain 01/21/2022   Generalized anxiety disorder with panic attacks 09/27/2021   Environmental and seasonal allergies 11/04/2020   Genital warts 09/11/2018   Positive TB test 09/25/2017   Allergic contact dermatitis due to metals 05/17/2017   Pre-diabetes 03/06/2017   Hyperlipidemia 03/06/2017   Allergic reaction 02/08/2017   Urticaria due to food  allergy 02/08/2017   Vitamin D deficiency 11/08/2016   Iron deficiency anemia due to chronic blood loss 10/03/2016   Menorrhagia with regular cycle 06/27/2016   Mild persistent asthma without complication 06/17/2016   Anxiety and depression 06/17/2016   Morbid obesity (HCC) 06/17/2016   PTSD (post-traumatic stress disorder) 2016     Physical Exam  Triage Vital Signs: ED Triage Vitals  Enc Vitals Group     BP 06/07/22 1817 131/62     Pulse Rate 06/07/22 1817 86     Resp 06/07/22 1817 20     Temp 06/07/22 1824 99.3 F (37.4 C)     Temp Source 06/07/22 1824 Oral     SpO2 06/07/22 1817 100 %     Weight 06/07/22 1820 242 lb 8.1 oz (110 kg)     Height 06/07/22 1820 5\' 7"  (1.702 m)     Head Circumference --      Peak Flow --      Pain Score 06/07/22 1817 0     Pain Loc --      Pain Edu? --      Excl. in GC? --     Most recent vital signs: Vitals:   06/07/22 1824 06/07/22 1917  BP:  135/72  Pulse:  89  Resp:  20  Temp: 99.3 F (37.4 C)   SpO2:  100%     General: Awake, no distress.  CV:  Good peripheral perfusion.  Resp:  Normal effort.  Abd:  No distention.  Neuro:             Awake, Alert, Oriented x 3  Other:  Aox3, nml speech  PERRL, EOMI, face symmetric, nml tongue movement  5/5 strength in the BL upper and lower extremities  Sensation grossly intact in the BL upper and lower extremities  Finger-nose-finger intact BL    ED Results / Procedures / Treatments  Labs (all labs ordered are listed, but only abnormal results are displayed) Labs Reviewed  CBC WITH DIFFERENTIAL/PLATELET - Abnormal; Notable for the following components:      Result Value   Hemoglobin 11.6 (*)    All other components within normal limits  TSH  BASIC METABOLIC PANEL  T4, FREE  POC URINE PREG, ED     EKG    RADIOLOGY I reviewed and interpreted the CT scan of the brain which does not show any acute intracranial process    PROCEDURES:  Critical Care performed:  No  Procedures   MEDICATIONS ORDERED IN ED: Medications - No data to display   IMPRESSION / MDM / ASSESSMENT AND PLAN / ED COURSE  I reviewed the triage vital signs and the nursing notes.                              Patient's presentation is most consistent with acute presentation with potential threat to life or bodily function.  Differential diagnosis includes, but is not limited to, anxiety, complex migraine, CVA, TIA, depression  The patient is a 36 year old female presents today with multiple symptoms.  Seems to have been going on for several weeks she has felt more anxious and depressed and overwhelmed.  Also endorses intermittent headache for which she is seeing neurology for.  Has had insomnia for the last 2 days.  What prompted her ED visit today was that she had what sounds like an olfactory hallucination of burning paper.  There is nonoccluding burning and no one else noted she had no associated neurologic symptoms.  Also feeling somewhat more days today.  Patient's vital signs within normal limits she looks well neurologic exam is totally nonfocal.  Her CT of her head is normal.  Labs including TSH and free T4 CBC CMP and pregnancy test are normal.  Unclear what the cause of patient's abnormal olfaction was today however I am reassured by her normal neurologic exam and CT of her head that this is not a CVA.  Could have been a complex migraine or TIA but again with no other associated weakness numbness feel this is less likely.  Will discharge.  She is seeing neurology tomorrow   Clinical Course as of 06/07/22 2003  Tue Jun 07, 2022  1956 T4,Free(Direct): 0.80 [KM]    Clinical Course User Index [KM] Georga Hacking, MD     FINAL CLINICAL IMPRESSION(S) / ED DIAGNOSES   Final diagnoses:  Olfactory hallucinations  Other fatigue  Anxiety     Rx / DC Orders   ED Discharge Orders     None        Note:  This document was prepared using Dragon voice recognition  software and may include unintentional dictation errors.   Georga Hacking, MD 06/07/22 2003

## 2022-06-07 NOTE — ED Provider Triage Note (Signed)
Emergency Medicine Provider Triage Evaluation Note  Jasmine Gordon , a 36 y.o. female  was evaluated in triage.  Pt complains of anxiety, stress, and concern for stroke.  She reports feeling unwell generally and reports some head tension and pressure.  She also had the sense that she was smelling burning paper and around her burning.  She reports the symptoms have been intermittent for the last week.  Review of Systems  Positive: Headache, fatigue, poor concentration Negative: FCS  Physical Exam  BP 131/62   Pulse 86   Temp 99.3 F (37.4 C) (Oral)   Resp 20   Ht 5\' 7"  (1.702 m)   Wt 110 kg   LMP 06/07/2022 (Exact Date)   SpO2 100%   BMI 37.98 kg/m  Gen:   Awake, no distress tearful, anxious Resp:  Normal effort CTA MSK:   Moves extremities without difficulty  Other:    Medical Decision Making  Medically screening exam initiated at 6:32 PM.  Appropriate orders placed.  Jasmine Gordon was informed that the remainder of the evaluation will be completed by another provider, this initial triage assessment does not replace that evaluation, and the importance of remaining in the ED until their evaluation is complete.  Patient with a history of depression, anxiety, PTSD, presents to the ED for evaluation of 1 week of malaise, head pressure and anxiety.  Patient is under the care of a neurologist as well as a psychotherapist who manages her behavioral medications.   [name], PA-C 06/07/22 1841

## 2022-06-07 NOTE — Discharge Instructions (Signed)
Her neurologic exam and CT were reassuring.  Your blood work is also normal.  Please follow-up with your neurologist tomorrow.

## 2022-06-08 ENCOUNTER — Inpatient Hospital Stay: Payer: Medicaid Other | Attending: Oncology

## 2022-06-08 DIAGNOSIS — E538 Deficiency of other specified B group vitamins: Secondary | ICD-10-CM | POA: Insufficient documentation

## 2022-06-08 DIAGNOSIS — R42 Dizziness and giddiness: Secondary | ICD-10-CM | POA: Diagnosis not present

## 2022-06-08 DIAGNOSIS — Z79899 Other long term (current) drug therapy: Secondary | ICD-10-CM | POA: Diagnosis not present

## 2022-06-08 DIAGNOSIS — R002 Palpitations: Secondary | ICD-10-CM | POA: Diagnosis not present

## 2022-06-08 DIAGNOSIS — G43909 Migraine, unspecified, not intractable, without status migrainosus: Secondary | ICD-10-CM | POA: Diagnosis not present

## 2022-06-08 DIAGNOSIS — R442 Other hallucinations: Secondary | ICD-10-CM | POA: Diagnosis not present

## 2022-06-08 DIAGNOSIS — D500 Iron deficiency anemia secondary to blood loss (chronic): Secondary | ICD-10-CM | POA: Diagnosis not present

## 2022-06-08 LAB — CBC WITH DIFFERENTIAL/PLATELET
Abs Immature Granulocytes: 0.01 10*3/uL (ref 0.00–0.07)
Basophils Absolute: 0 10*3/uL (ref 0.0–0.1)
Basophils Relative: 1 %
Eosinophils Absolute: 0.1 10*3/uL (ref 0.0–0.5)
Eosinophils Relative: 2 %
HCT: 37.4 % (ref 36.0–46.0)
Hemoglobin: 11.8 g/dL — ABNORMAL LOW (ref 12.0–15.0)
Immature Granulocytes: 0 %
Lymphocytes Relative: 35 %
Lymphs Abs: 1.5 10*3/uL (ref 0.7–4.0)
MCH: 26.8 pg (ref 26.0–34.0)
MCHC: 31.6 g/dL (ref 30.0–36.0)
MCV: 85 fL (ref 80.0–100.0)
Monocytes Absolute: 0.3 10*3/uL (ref 0.1–1.0)
Monocytes Relative: 6 %
Neutro Abs: 2.4 10*3/uL (ref 1.7–7.7)
Neutrophils Relative %: 56 %
Platelets: 226 10*3/uL (ref 150–400)
RBC: 4.4 MIL/uL (ref 3.87–5.11)
RDW: 13 % (ref 11.5–15.5)
WBC: 4.2 10*3/uL (ref 4.0–10.5)
nRBC: 0 % (ref 0.0–0.2)

## 2022-06-08 LAB — VITAMIN B12: Vitamin B-12: 192 pg/mL (ref 180–914)

## 2022-06-08 LAB — IRON AND TIBC
Iron: 55 ug/dL (ref 28–170)
Saturation Ratios: 16 % (ref 10.4–31.8)
TIBC: 349 ug/dL (ref 250–450)
UIBC: 294 ug/dL

## 2022-06-08 LAB — FERRITIN: Ferritin: 28 ng/mL (ref 11–307)

## 2022-06-08 MED FILL — Iron Sucrose Inj 20 MG/ML (Fe Equiv): INTRAVENOUS | Qty: 10 | Status: AC

## 2022-06-09 ENCOUNTER — Telehealth: Payer: Self-pay

## 2022-06-09 ENCOUNTER — Inpatient Hospital Stay: Payer: Medicaid Other

## 2022-06-09 ENCOUNTER — Encounter: Payer: Self-pay | Admitting: Oncology

## 2022-06-09 ENCOUNTER — Inpatient Hospital Stay: Payer: Medicaid Other | Admitting: Oncology

## 2022-06-09 VITALS — BP 114/66 | HR 100 | Temp 98.9°F | Resp 18 | Wt 244.5 lb

## 2022-06-09 DIAGNOSIS — E538 Deficiency of other specified B group vitamins: Secondary | ICD-10-CM

## 2022-06-09 DIAGNOSIS — Z79899 Other long term (current) drug therapy: Secondary | ICD-10-CM | POA: Diagnosis not present

## 2022-06-09 DIAGNOSIS — D500 Iron deficiency anemia secondary to blood loss (chronic): Secondary | ICD-10-CM

## 2022-06-09 NOTE — Progress Notes (Signed)
Hematology/Oncology Progress note [phone] [phone]     Clinic Day:  06/09/2022   Referring physician: [name] *  ASSESSMENT & PLAN:   Assessment & Plan: Iron deficiency anemia due to chronic blood loss Labs are reviewed and discussed with patient. Hemoglobin has improved.  No need for IV venofer.    Vitamin B12 deficiency Recommend patient take Vitamin B12 -    The patient understands the plans discussed today and is in agreement with them.  She knows to contact our office if she develops concerns prior to her next appointment.  Follow up in 6 onths.  [name], MD  Orders Placed This Encounter  Procedures   CBC with Differential/Platelet    Standing Status:   Future    Standing Expiration Date:   06/10/2023   Ferritin    Standing Status:   Future    Standing Expiration Date:   06/10/2023   Vitamin B12    Standing Status:   Future    Standing Expiration Date:   06/10/2023   Iron and TIBC    Standing Status:   Future    Standing Expiration Date:   06/10/2023      CHIEF COMPLAINT:  Chief complaints: Follow up for iron deficiency anemia.   INTERVAL HISTORY:  Jasmine Gordon presents today for follow  up.  She reports feeling well.  She just finished a menstrual cycle.  She takes iron contained multi-vitamin.   REVIEW OF SYSTEMS:  Review of Systems  Constitutional:  Negative for appetite change, chills, fatigue and fever.  HENT:   Negative for hearing loss and voice change.   Eyes:  Negative for eye problems.  Respiratory:  Negative for chest tightness and cough.   Cardiovascular:  Negative for chest pain.  Gastrointestinal:  Negative for abdominal distention, abdominal pain and blood in stool.  Endocrine: Negative for hot flashes.  Genitourinary:  Negative for difficulty urinating and frequency.   Musculoskeletal:  Negative for arthralgias.  Skin:  Negative for itching and rash.  Neurological:  Negative for extremity  weakness.  Hematological:  Negative for adenopathy.  Psychiatric/Behavioral:  Negative for confusion.      VITALS:  Blood pressure 114/66, pulse 100, temperature 98.9 F (37.2 C), resp. rate 18, weight 244 lb 8 oz (110.9 kg), last menstrual period 06/07/2022, SpO2 100 %.  Wt Readings from Last 3 Encounters:  06/09/22 244 lb 8 oz (110.9 kg)  06/07/22 242 lb 8.1 oz (110 kg)  05/18/22 243 lb (110.2 kg)    Body mass index is 38.29 kg/m.  PHYSICAL EXAM:  Physical Exam Constitutional:      General: She is not in acute distress.    Appearance: She is not diaphoretic.  HENT:     Head: Normocephalic and atraumatic.     Nose: Nose normal.     Mouth/Throat:     Pharynx: No oropharyngeal exudate.  Eyes:     General: No scleral icterus.    Pupils: Pupils are equal, round, and reactive to light.  Cardiovascular:     Rate and Rhythm: Normal rate and regular rhythm.     Heart sounds: No murmur heard. Pulmonary:     Effort: Pulmonary effort is normal. No respiratory distress.     Breath sounds: No rales.  Chest:     Chest wall: No tenderness.  Abdominal:     General: There is no distension.     Palpations: Abdomen is soft.     Tenderness: There is no abdominal tenderness.  Musculoskeletal:        General: Normal range of motion.     Cervical back: Normal range of motion and neck supple.  Skin:    General: Skin is warm and dry.     Findings: No erythema.  Neurological:     Mental Status: She is alert and oriented to person, place, and time.     Cranial Nerves: No cranial nerve deficit.     Motor: No abnormal muscle tone.     Coordination: Coordination normal.  Psychiatric:        Mood and Affect: Affect normal.      LABS:      Latest Ref Rng & Units 06/08/2022    8:27 AM 06/07/2022    6:23 PM 04/21/2022    8:08 PM  CBC  WBC 4.0 - 10.5 K/uL 4.2  5.3  5.5   Hemoglobin 12.0 - 15.0 g/dL 18.8  41.6  60.6   Hematocrit 36.0 - 46.0 % 37.4  36.8  35.8   Platelets 150 - 400 K/uL  226  228  218       Latest Ref Rng & Units 06/07/2022    6:23 PM 05/18/2022    4:19 PM 04/21/2022    8:08 PM  CMP  Glucose 70 - 99 mg/dL 98  97  94   BUN 6 - 20 mg/dL 11  16  13    Creatinine 0.44 - 1.00 mg/dL  3.01  6.01   Sodium 135 - 145 mmol/L 138  138  137   Potassium 3.5 - 5.1 mmol/L 3.8  3.9  3.5   Chloride 98 - 111 mmol/L 108  107  107   CO2 22 - 32 mmol/L 24  25  22    Calcium 8.9 - 10.3 mg/dL 9.3  9.2  9.3     Lab Results  Component Value Date   TIBC 349 06/08/2022   TIBC 333 12/01/2021   TIBC 322 05/21/2021   FERRITIN 28 06/08/2022   FERRITIN 18 12/01/2021   FERRITIN 26 05/21/2021   IRONPCTSAT 16 06/08/2022   IRONPCTSAT 18 12/01/2021   IRONPCTSAT 21 05/21/2021     STUDIES:  CT Head Wo Contrast  Result Date: 06/07/2022 CLINICAL DATA:  Mental status change EXAM: CT HEAD WITHOUT CONTRAST TECHNIQUE: Contiguous axial images were obtained from the base of the skull through the vertex without intravenous contrast. RADIATION DOSE REDUCTION: This exam was performed according to the departmental dose-optimization program which includes automated exposure control, adjustment of the mA and/or kV according to patient size and/or use of iterative reconstruction technique. COMPARISON:  CT brain 10/27/2021 FINDINGS: Brain: No evidence of acute infarction, hemorrhage, hydrocephalus, extra-axial collection or mass lesion/mass effect. Vascular: No hyperdense vessel or unexpected calcification. Skull: Normal. Negative for fracture or focal lesion. Sinuses/Orbits: No acute finding. Other: None IMPRESSION: Negative non contrasted CT appearance of the brain. Electronically Signed   By: 06/09/2022 M.D.   On: 06/07/2022 18:49      HISTORY:   Past Medical History:  Diagnosis Date   Allergy    Anemia    Anxiety    Asthma    Migraine    Obese    Panic attack    PTSD (post-traumatic stress disorder) 2016    Past Surgical History:  Procedure Laterality Date   APPENDECTOMY      LEEP  12/2020   CIN II, negative margins   Plantar warts     WISDOM TOOTH EXTRACTION  Family History  Problem Relation Age of Onset   Alcohol abuse Mother    Drug abuse Mother    Diabetes Mother    Depression Mother    Mental retardation Mother    Bipolar disorder Mother    Heart disease Father    Diabetes Father    Heart failure Sister    Stroke Maternal Grandfather    Diabetes Maternal Grandfather     Social History:  reports that she has never smoked. She has never used smokeless tobacco. She reports that she does not currently use alcohol. She reports that she does not use drugs.  Allergies:  Allergies  Allergen Reactions   Reglan [Metoclopramide] Hives and Shortness Of Breath   Contrave [Naltrexone-Bupropion Hcl Er] Other (See Comments)    Had some reaction to the medication    Current Medications: Current Outpatient Medications  Medication Sig Dispense Refill   albuterol (VENTOLIN HFA) 108 (90 Base) MCG/ACT inhaler Inhale 2 puffs into the lungs every 6 (six) hours as needed for wheezing or shortness of breath. 8 g 3   cetirizine (ZYRTEC) 10 MG tablet Take 10 mg by mouth daily as needed for allergies.     chlorhexidine (PERIDEX) 0.12 % solution Use as directed 10 mLs in the mouth or throat.     FLUoxetine (PROZAC) 10 MG capsule Take by mouth.     fluticasone (FLONASE) 50 MCG/ACT nasal spray Place 1 spray into both nostrils daily. Use for 4-6 weeks then stop and use seasonally or as needed. 16 g 0   hydrOXYzine (ATARAX) 25 MG tablet Take by mouth.     metoprolol tartrate (LOPRESSOR) 25 MG tablet Take 0.5 tablets (12.5 mg total) by mouth 2 (two) times daily. 20 tablet 0   montelukast (SINGULAIR) 10 MG tablet Take 10 mg by mouth at bedtime as needed.     Multiple Vitamins-Minerals (ONE-A-DAY WOMENS PO) Take 1 tablet by mouth daily.     OVER THE COUNTER MEDICATION Goli ashwaganda gummy 1/2 daily     pantoprazole (PROTONIX) 40 MG tablet Take 1 tablet (40 mg total) by  mouth daily. 30 tablet 5   Spacer/Aero-Holding Chambers (AEROCHAMBER MV) inhaler Use as instructed 1 each 2   Vitamin D, Ergocalciferol, (DRISDOL) 1.25 MG (50000 UNIT) CAPS capsule Take 1 capsule (50,000 Units total) by mouth every 7 (seven) days. 12 capsule 1   FLUoxetine (PROZAC) 20 MG capsule Take 1 capsule (20 mg total) by mouth daily. (Patient not taking: Reported on 05/12/2022) 30 capsule 1   hydrOXYzine (ATARAX) 10 MG tablet Take 1 tablet (10 mg total) by mouth 3 (three) times daily as needed for anxiety. (Patient not taking: Reported on 06/09/2022) 60 tablet 3   No current facility-administered medications for this visit.

## 2022-06-09 NOTE — Telephone Encounter (Signed)
Pt calling; has questions regarding her sxs.  [phone] Pt states 7-10d before her period starts her mood gets bad; she is extremely down, anxious and stressed.  This has been going on since March.  Adv to schedule appt and d/w provider.  Tx'd to [facility] for scheduling.

## 2022-06-09 NOTE — Assessment & Plan Note (Addendum)
Recommend patient take Vitamin B12 -

## 2022-06-09 NOTE — Assessment & Plan Note (Signed)
Labs are reviewed and discussed with patient. Hemoglobin has improved.  No need for IV venofer.

## 2022-06-13 ENCOUNTER — Other Ambulatory Visit: Payer: Self-pay | Admitting: Physician Assistant

## 2022-06-13 DIAGNOSIS — R42 Dizziness and giddiness: Secondary | ICD-10-CM

## 2022-06-13 DIAGNOSIS — R519 Headache, unspecified: Secondary | ICD-10-CM

## 2022-06-13 DIAGNOSIS — R442 Other hallucinations: Secondary | ICD-10-CM

## 2022-06-14 ENCOUNTER — Ambulatory Visit: Payer: Medicaid Other | Admitting: Medical

## 2022-06-14 ENCOUNTER — Encounter: Payer: Self-pay | Admitting: Medical

## 2022-06-14 VITALS — BP 108/76 | HR 97 | Ht 67.0 in | Wt 244.4 lb

## 2022-06-14 DIAGNOSIS — D500 Iron deficiency anemia secondary to blood loss (chronic): Secondary | ICD-10-CM

## 2022-06-14 DIAGNOSIS — E7800 Pure hypercholesterolemia, unspecified: Secondary | ICD-10-CM

## 2022-06-14 DIAGNOSIS — R002 Palpitations: Secondary | ICD-10-CM

## 2022-06-14 DIAGNOSIS — R079 Chest pain, unspecified: Secondary | ICD-10-CM

## 2022-06-14 NOTE — Progress Notes (Signed)
Cardiology Office Note:    Date:  06/14/2022   ID:  Jasmine Gordon, DOB [birth year], MRN [id number]  PCP:  [name], DO  [facility] Cardiologist:  None  [facility] Electrophysiologist:  None   Referring MD: [name] *   Chief Complaint: 4 week follow-up  History of Present Illness:    Jasmine Gordon is a 36 y.o. female with a hx of  HLD, iron deficiency anemia, asthma, obesity, panic attacks and PTSD who presents for palpitations.    Saw Dr. Okey Dupre in May 2023 for chest pain and palpitations. An echo and heart monitor were ordered.    Echo 01/2021 showed LVEF 55-60% with normal wall motion and diastolic function.  Normal RV size and function.  Normal PA pressure.  Normal biatrial size.  Mild tricuspid regurgitation.  Normal CVP. Heart monitor showed predominantly sinus rhythm with rare PACs and PVCs. She was recommended as needed follow-up.    ER visit 6/1 for atypical chest pain. BP was mildly elevated. Hs trop was negative x 2.   Last seen 05/18/22 and reported palpitations with chest pain. HR was around 100bpm. Lopressor was started. Cardiac CTA was ordered.   Today, the patient reports she did not get the Cardiac CTA done because she started feeling better. She also did not start the Lopressor due to possible side effects. She is overall feeling better. She noticed heart rate was high because she was dehydrated. She has been drinking more water. She denies chest pain or SOB. No further palpitations, she noticed these generally around her period. EKG today showed NSR with a heart rate of 97bpm.  Past Medical History:  Diagnosis Date   Allergy    Anemia    Anxiety    Asthma    Migraine    Obese    Panic attack    PTSD (post-traumatic stress disorder) 2016    Past Surgical History:  Procedure Laterality Date   APPENDECTOMY     LEEP  12/2020   CIN II, negative margins   Plantar warts     WISDOM TOOTH EXTRACTION      Current  Medications: Current Meds  Medication Sig   albuterol (VENTOLIN HFA) 108 (90 Base) MCG/ACT inhaler Inhale 2 puffs into the lungs every 6 (six) hours as needed for wheezing or shortness of breath.   cetirizine (ZYRTEC) 10 MG tablet Take 10 mg by mouth daily as needed for allergies.   chlorhexidine (PERIDEX) 0.12 % solution Use as directed 10 mLs in the mouth or throat.   FLUoxetine (PROZAC) 10 MG capsule Take by mouth.   fluticasone (FLONASE) 50 MCG/ACT nasal spray Place 1 spray into both nostrils daily. Use for 4-6 weeks then stop and use seasonally or as needed.   hydrOXYzine (ATARAX) 10 MG tablet Take 1 tablet (10 mg total) by mouth 3 (three) times daily as needed for anxiety.   hydrOXYzine (ATARAX) 25 MG tablet Take by mouth.   montelukast (SINGULAIR) 10 MG tablet Take 10 mg by mouth at bedtime as needed.   Multiple Vitamins-Minerals (ONE-A-DAY WOMENS PO) Take 1 tablet by mouth daily.   OVER THE COUNTER MEDICATION Goli ashwaganda gummy 1/2 daily   pantoprazole (PROTONIX) 40 MG tablet Take 1 tablet (40 mg total) by mouth daily.   Spacer/Aero-Holding Chambers (AEROCHAMBER MV) inhaler Use as instructed   Vitamin D, Ergocalciferol, (DRISDOL) 1.25 MG (50000 UNIT) CAPS capsule Take 1 capsule (50,000 Units total) by mouth every 7 (seven) days.  Allergies:   Reglan [metoclopramide] and Contrave [naltrexone-bupropion hcl er]   Social History   Socioeconomic History   Marital status: Married    Spouse name: Not on file   Number of children: Not on file   Years of education: Not on file   Highest education level: Not on file  Occupational History   Not on file  Tobacco Use   Smoking status: Never   Smokeless tobacco: Never  Vaping Use   Vaping Use: Never used  Substance and Sexual Activity   Alcohol use: Not Currently   Drug use: No   Sexual activity: Yes    Birth control/protection: Condom  Other Topics Concern   Not on file  Social History Narrative   Not on file   Social  Determinants of Health   Financial Resource Strain: Not on file  Food Insecurity: No Food Insecurity (05/12/2022)   Hunger Vital Sign    Worried About Running Out of Food in the Last Year: Never true    Ran Out of Food in the Last Year: Never true  Transportation Needs: No Transportation Needs (03/07/2022)   PRAPARE - Administrator, Civil Service (Medical): No    Lack of Transportation (Non-Medical): No  Physical Activity: Inactive (05/12/2022)   Exercise Vital Sign    Days of Exercise per Week: 0 days    Minutes of Exercise per Session: 0 min  Stress: No Stress Concern Present (05/18/2022)   [employer] of Occupational Health - Occupational Stress Questionnaire    Feeling of Stress : Only a little  Recent Concern: Stress - Stress Concern Present (04/13/2022)   [employer] of Occupational Health - Occupational Stress Questionnaire    Feeling of Stress : Very much  Social Connections: Not on file     Family History: The patient's family history includes Alcohol abuse in her mother; Bipolar disorder in her mother; Depression in her mother; Diabetes in her father, maternal grandfather, and mother; Drug abuse in her mother; Heart disease in her father; Heart failure in her sister; Mental retardation in her mother; Stroke in her maternal grandfather.  ROS:   Please see the history of present illness.     All other systems reviewed and are negative.  EKGs/Labs/Other Studies Reviewed:    The following studies were reviewed today:  Echo 01/2022   1. Left ventricular ejection fraction, by estimation, is 55 to 60%. The  left ventricle has normal function. The left ventricle has no regional  wall motion abnormalities. Left ventricular diastolic parameters were  normal.   2. Right ventricular systolic function is normal. The right ventricular  size is normal. There is normal pulmonary artery systolic pressure. The  estimated right ventricular systolic pressure is 23.8  mmHg.   3. The mitral valve is normal in structure. No evidence of mitral valve  regurgitation. No evidence of mitral stenosis.   4. The aortic valve is normal in structure. Aortic valve regurgitation is  not visualized. No aortic stenosis is present.   5. The inferior vena cava is normal in size with greater than 50%  respiratory variability, suggesting right atrial pressure of 3 mmHg.   Heart monitor 01/2022 The patient was monitored for 13 days, 13 hours. The predominant rhythm was sinus with an average rate of 91 bpm (range 53-170 bpm). There were rare PAC's and PVC's. No sustained arrhythmia or prolonged pause was observed. Patient triggered events correspond to sinus rhythm as well as rare isolated PAC's and PVC's.  Predominantly sinus rhythm with rare PAC's and PVC's.    EKG:  EKG is  ordered today.  The ekg ordered today demonstrates NSR 97bpm, nonspecific T wave changes  Recent Labs: 03/23/2022: ALT 10 06/07/2022: BUN 11; Creatinine, Ser 0.79; Potassium 3.8; Sodium 138; TSH 2.975 06/08/2022: Hemoglobin 11.8; Platelets 226  Recent Lipid Panel    Component Value Date/Time   CHOL 224 (H) 09/07/2021 0958   TRIG 77 09/07/2021 0958   HDL 42 (L) 09/07/2021 0958   CHOLHDL 5.3 (H) 09/07/2021 0958   VLDL 11 06/07/2017 0921   LDLCALC 164 (H) 09/07/2021 0958   Physical Exam:    VS:  BP 108/76 (BP Location: Left Arm, Patient Position: Sitting, Cuff Size: Large)   Pulse 97   Ht 5\' 7"  (1.702 m)   Wt 244 lb 6.4 oz (110.9 kg)   LMP 06/07/2022 (Exact Date)   SpO2 99%   BMI 38.28 kg/m     Wt Readings from Last 3 Encounters:  06/14/22 244 lb 6.4 oz (110.9 kg)  06/09/22 244 lb 8 oz (110.9 kg)  06/07/22 242 lb 8.1 oz (110 kg)     GEN:  Well nourished, well developed in no acute distress HEENT: Normal NECK: No JVD; No carotid bruits LYMPHATICS: No lymphadenopathy CARDIAC: RRR, no murmurs, rubs, gallops RESPIRATORY:  Clear to auscultation without rales, wheezing or rhonchi   ABDOMEN: Soft, non-tender, non-distended MUSCULOSKELETAL:  No edema; No deformity  SKIN: Warm and dry NEUROLOGIC:  Alert and oriented x 3 PSYCHIATRIC:  Normal affect   ASSESSMENT:    1. Chest pain, unspecified type   2. Pure hypercholesterolemia   3. Iron deficiency anemia due to chronic blood loss   4. Palpitations    PLAN:    In order of problems listed above:  Atypical chest pain No further chest pain reported by the patient. She did not get the Cardiac CTA done because she started feeling better. No further work-up at this time.   Palpitations Sinus tachycardia-resolved Patient was tachycardic at the last visit with heart rate up tp 110bpm. Today heart rates is better, 97bpm on EKG. She felt elevated heart rate was from dehydration. Prior heart monitor showed NSR with PACs and PVCs. She did not start Lopressor due to possible side affects. No further work-up.   HTN BP is good today. No changes.  Disposition: Follow up in 1 year(s) with MD/APP     Signed, Sindi Beckworth 06/09/22, PA-C  06/14/2022 2:20 PM    [facility]

## 2022-06-14 NOTE — Patient Instructions (Signed)
Medication Instructions:   Your physician recommends that you continue on your current medications as directed. Please refer to the Current Medication list given to you today.  *If you need a refill on your cardiac medications before your next appointment, please call your pharmacy*   Lab Work:  None ordered  Testing/Procedures:  None ordered   Follow-Up: At [facility], you and your health needs are our priority.  As part of our continuing mission to provide you with exceptional heart care, we have created designated Provider Care Teams.  These Care Teams include your primary Cardiologist (physician) and Advanced Practice Providers (APPs -  Physician Assistants and Nurse Practitioners) who all work together to provide you with the care you need, when you need it.  We recommend signing up for the patient portal called "MyChart".  Sign up information is provided on this After Visit Summary.  MyChart is used to connect with patients for Virtual Visits (Telemedicine).  Patients are able to view lab/test results, encounter notes, upcoming appointments, etc.  Non-urgent messages can be sent to your provider as well.   To learn more about what you can do with MyChart, go to [email or web address].    Your next appointment:   1 year(s)  The format for your next appointment:   In Person  Provider:   You may see Dr. Christopher End or one of the following Advanced Practice Providers on your designated Care Team:   Christopher Berge, NP Ryan Dunn, PA-C Cadence Furth, PA-C{   Important Information About Sugar       

## 2022-06-18 ENCOUNTER — Ambulatory Visit
Admission: RE | Admit: 2022-06-18 | Discharge: 2022-06-18 | Disposition: A | Discharge status: Home / Self Care | Payer: Medicaid Other | Source: Ambulatory Visit | Attending: Physician Assistant | Admitting: Physician Assistant

## 2022-06-18 DIAGNOSIS — R442 Other hallucinations: Secondary | ICD-10-CM | POA: Insufficient documentation

## 2022-06-18 DIAGNOSIS — R519 Headache, unspecified: Secondary | ICD-10-CM | POA: Diagnosis not present

## 2022-06-18 DIAGNOSIS — R42 Dizziness and giddiness: Secondary | ICD-10-CM | POA: Diagnosis not present

## 2022-06-18 MED ORDER — GADOBUTROL 1 MMOL/ML IV SOLN
10.0000 mL | Freq: Once | INTRAVENOUS | Status: AC | PRN
  Administered 2022-06-18: 10 mL via INTRAVENOUS

## 2022-06-20 ENCOUNTER — Other Ambulatory Visit: Payer: Self-pay | Admitting: Obstetrics and Gynecology

## 2022-06-20 NOTE — Patient Instructions (Signed)
Hi [name], so nice to speak with you this morning-have a terrific week!  [name] was given information about Medicaid Managed Care team care coordination services as a part of their Healthy [facility] benefit. Jasmine Gordon verbally consented to engagement with the [facility] Managed Care team.   If you are experiencing a medical emergency, please call 911 or report to your local emergency department or urgent care.   If you have a non-emergency medical problem during routine business hours, please contact your provider's office and ask to speak with a nurse.   For questions related to your Healthy [facility] health plan, please call: [phone] or visit the homepage here: [email or web address]  If you would like to schedule transportation through your Healthy [facility] plan, please call the following number at least 2 days in advance of your appointment: [phone]  For information about your ride after you set it up, call Ride Assist at [phone]. Use this number to activate a Will Call pickup, or if your transportation is late for a scheduled pickup. Use this number, too, if you need to make a change or cancel a previously scheduled reservation.  If you need transportation services right away, call [phone]. The after-hours call center is staffed 24 hours to handle ride assistance and urgent reservation requests (including discharges) 365 days a year. Urgent trips include sick visits, hospital discharge requests and life-sustaining treatment.  Call the [facility] Line at [phone], at any time, 24 hours a day, 7 days a week. If you are in danger or need immediate medical attention call 911.  If you would like help to quit smoking, call 1-800-QUIT-NOW (647-550-1432) OR Espaol: 1-855-Djelo-Ya (3-009-233-0076) o para ms informacin haga clic aqu or Text READY to 226-333 to register via text  Ms.  Jasmine Gordon - following are the goals we discussed in your visit today:   Goals Addressed    Timeframe:  Long-Range Goal Priority:  High Start Date:   03/07/22                          Expected End Date:     ongoing                  Follow Up Date 07/29/22   - schedule appointment for flu shot - schedule appointment for vaccines needed due to my age or health - schedule recommended health tests (blood work, mammogram, colonoscopy, pap test) - schedule and keep appointment for annual check-up    Why is this important?   Screening tests can find diseases early when they are easier to treat.  Your doctor or nurse will talk with you about which tests are important for you.  Getting shots for common diseases like the flu and shingles will help prevent them.  06/20/22:  Patient recently seen and evaluated by neuro, cards, heme and all WNL, has GYN appt 8/7   Patient verbalizes understanding of instructions and care plan provided today and agrees to view in MyChart. Active MyChart status and patient understanding of how to access instructions and care plan via MyChart confirmed with patient.     The Managed Medicaid care management team will reach out to the patient again over the next 30 business  days.  The  Patient has been provided with contact information for the Managed Medicaid care management team and has been advised to call with any health related questions or concerns.  [name] RN, BSN [place]  [facility] Care Management Coordinator - Managed Medicaid High Risk [phone]   Following is a copy of your plan of care:  Care Plan : RN Care Manager Plan of Care  Updates made by [name], RN since 06/20/2022 12:00 AM     Problem: Health Promotion or Disease Self-Management (General Plan of Care)      Long-Range Goal: Chronic Disease Management and Care Coordination Needs   Expected End Date: 08/12/2022  Priority: High  Note:   Current Barriers:   Knowledge Deficits related to plan of care for management of Asthma, anxiety  Chronic Disease Management support and education needs related to Asthma, anxiety 06/20/22:  Patient with continued anxiety-sees counselor once a month-did not like Psychiatrist she saw and has no follow up appts with her right now.  Recent appts with neuro, heme, and cards-all WNL.  Has GYN appt 8/7 and EEG 8/12.  Still working at [employer] and has decreased working hours-works [phone] three evenings a week.  Patient requested f/u with LCSW-appt scheduled.  Breathing WNL.  RNCM Clinical Goal(s):  Patient will verbalize understanding of plan for management of Asthma, anxiety as evidenced by patient report verbalize basic understanding of  Asthma disease process and self health management plan as evidenced by patient report take all medications exactly as prescribed and will call provider for medication related questions as evidenced by patient report demonstrate understanding of rationale for each prescribed medication as evidenced by patient report attend all scheduled medical appointments: as evidenced by patient report continue to work with RN Care Manager to address care management and care coordination needs related to  Asthma as evidenced by adherence to CM Team Scheduled appointments work with social worker to address  related to the management of Mental Health Concerns  related to the management of Anxiety, Depression, and PTSD, panic attacks as evidenced by review of EMR and patient or social worker report experience decrease in ED visits as evidenced by EMR review.  ED visits in in last 6 months = 13 through collaboration with RN Care manager, provider, and care team-ED visit 7/18 for fatigue, insomnia  Interventions: Inter-disciplinary care team collaboration (see longitudinal plan of care) Evaluation of current treatment plan related to  self management and patient's adherence to plan as established by  provider  Asthma: (Status:New goal.) Long Term Goal Advised patient to track and manage Asthma triggers Advised patient to self assesses Asthma action plan zone and make appointment with provider if in the yellow zone for 48 hours without improvement Discussed the importance of adequate rest and management of fatigue with Asthma Assessed social determinant of health barriers  Collaborated with SW SW referral for anxiety/depression/panic attacks-completed  Patient Goals/Self-Care Activities: Take all medications as prescribed Attend all scheduled provider appointments Call pharmacy for medication refills 3-7 days in advance of running out of medications Perform all self care activities independently  Perform IADL's (shopping, preparing meals, housekeeping, managing finances) independently Call provider office for new concerns or questions  Work with the social worker to address care coordination needs and will continue to work with the clinical team to address health care and disease management related needs Patient to contact Pharmacy and/or Provider regarding medication  Follow Up Plan:  The patient has been provided with contact information for the care management team and has been advised to call with any health related questions or concerns.  The care management team will reach out to the patient  again over the next 30 business  days.

## 2022-06-20 NOTE — Patient Outreach (Signed)
Medicaid Managed Care   Nurse Care Manager Note  06/20/2022 Name:  Jasmine Gordon MRN:  [id number] DOB:  12-29-1985  Jasmine Gordon is an 36 y.o. year old female who is a primary patient of [name], DO.  The [facility] Managed Care Coordination team was consulted for assistance with:    Chronic healthcare management needs, asthma, anxiety, HLD  [name] was given information about Medicaid Managed Care Coordination team services today. Jasmine Gordon Patient agreed to services and verbal consent obtained.  Engaged with patient by telephone for follow up visit in response to provider referral for case management and/or care coordination services.   Assessments/Interventions:  Review of past medical history, allergies, medications, health status, including review of consultants reports, laboratory and other test data, was performed as part of comprehensive evaluation and provision of chronic care management services.  SDOH (Social Determinants of Health) assessments and interventions performed: SDOH Interventions    Flowsheet Row Most Recent Value  SDOH Interventions   Social Connections Interventions Intervention Not Indicated     Care Plan  Allergies  Allergen Reactions   Reglan [Metoclopramide] Hives and Shortness Of Breath   Contrave [Naltrexone-Bupropion Hcl Er] Other (See Comments)    Had some reaction to the medication    Medications Reviewed Today     Reviewed by [name], RN (Registered Nurse) on 06/20/22 at [phone]  Med List Status: <None>   Medication Order Taking? Sig Documenting Provider Last Dose Status Informant  albuterol (VENTOLIN HFA) 108 (90 Base) MCG/ACT inhaler [id number] No Inhale 2 puffs into the lungs every 6 (six) hours as needed for wheezing or shortness of breath. [name], DO Taking Active   cetirizine (ZYRTEC) 10 MG tablet [id number] No Take 10 mg by mouth daily as needed for allergies. [provider] Taking Active   chlorhexidine (PERIDEX) 0.12 % solution [id number] No Use as directed 10 mLs in the mouth or throat. [provider] Taking Active   FLUoxetine (PROZAC) 10 MG capsule [id number] No Take by mouth. [provider] Taking Active   FLUoxetine (PROZAC) 20 MG capsule [id number] No Take 1 capsule (20 mg total) by mouth daily.  Patient not taking: Reported on 05/12/2022   Jasmine Hotter, MD Not Taking Expired 06/12/22 2359   fluticasone (FLONASE) 50 MCG/ACT nasal spray [id number] No Place 1 spray into both nostrils daily. Use for 4-6 weeks then stop and use seasonally or as needed. Lorre Munroe, NP Taking Active   hydrOXYzine (ATARAX) 10 MG tablet [id number] No Take 1 tablet (10 mg total) by mouth 3 (three) times daily as needed for anxiety. [name], DO Taking Active   hydrOXYzine (ATARAX) 25 MG tablet [id number] No Take by mouth. [provider] Taking Active   metoprolol tartrate (LOPRESSOR) 25 MG tablet [id number] No Take 0.5 tablets (12.5 mg total) by mouth 2 (two) times daily.  Patient not taking: Reported on 06/14/2022   Furth, Cadence H, PA-C Not Taking Active   montelukast (SINGULAIR) 10 MG tablet [id number] No Take 10 mg by mouth at bedtime as needed. [provider] Taking Active   Multiple Vitamins-Minerals (ONE-A-DAY WOMENS PO) [id number] No Take 1 tablet by mouth daily. [provider] Taking Active   OVER THE COUNTER MEDICATION [id number] No Jasmine Gordon ashwaganda gummy 1/2 daily [provider] Taking Active   pantoprazole (PROTONIX) 40 MG tablet [id number] No Take 1 tablet (40 mg total) by mouth daily. Fransico Michael, Cadence H, PA-C Taking Active  Spacer/Aero-Holding Chambers (AEROCHAMBER MV) inhaler [id number] No Use as instructed [name], DO Taking Active   Vitamin D, Ergocalciferol, (DRISDOL) 1.25 MG (50000 UNIT) CAPS capsule [id number] No Take 1 capsule (50,000 Units total) by mouth every 7  (seven) days. [name], DO Taking Active            Patient Active Problem List   Diagnosis Date Noted   Vitamin B12 deficiency 06/09/2022   Palpitations 01/21/2022   Shortness of breath 01/21/2022   Chest pain 01/21/2022   Generalized anxiety disorder with panic attacks 09/27/2021   Environmental and seasonal allergies 11/04/2020   Genital warts 09/11/2018   Positive TB test 09/25/2017   Allergic contact dermatitis due to metals 05/17/2017   Pre-diabetes 03/06/2017   Hyperlipidemia 03/06/2017   Allergic reaction 02/08/2017   Urticaria due to food allergy 02/08/2017   Vitamin D deficiency 11/08/2016   Iron deficiency anemia due to chronic blood loss 10/03/2016   Menorrhagia with regular cycle 06/27/2016   Mild persistent asthma without complication 06/17/2016   Anxiety and depression 06/17/2016   Morbid obesity (HCC) 06/17/2016   PTSD (post-traumatic stress disorder) 2016   Conditions to be addressed/monitored per PCP order:   chronic healthcare management needs, asthma, anxiety, HLD  Care Plan : RN Care Manager Plan of Care  Updates made by [name], RN since 06/20/2022 12:00 AM     Problem: Health Promotion or Disease Self-Management (General Plan of Care)      Long-Range Goal: Chronic Disease Management and Care Coordination Needs   Expected End Date: 08/12/2022  Priority: High  Note:   Current Barriers:  Knowledge Deficits related to plan of care for management of Asthma, anxiety  Chronic Disease Management support and education needs related to Asthma, anxiety 06/20/22:  Patient with continued anxiety-sees counselor once a month-did not like Psychiatrist she saw and has no follow up appts with her right now.  Recent appts with neuro, heme, and cards-all WNL.  Has GYN appt 8/7 and EEG 8/12.  Still working at [employer] and has decreased working hours-works [phone] three evenings a week.  Patient requested f/u with LCSW-appt scheduled.  Breathing  WNL.  RNCM Clinical Goal(s):  Patient will verbalize understanding of plan for management of Asthma, anxiety as evidenced by patient report verbalize basic understanding of  Asthma disease process and self health management plan as evidenced by patient report take all medications exactly as prescribed and will call provider for medication related questions as evidenced by patient report demonstrate understanding of rationale for each prescribed medication as evidenced by patient report attend all scheduled medical appointments: as evidenced by patient report continue to work with RN Care Manager to address care management and care coordination needs related to  Asthma as evidenced by adherence to CM Team Scheduled appointments work with social worker to address  related to the management of Mental Health Concerns  related to the management of Anxiety, Depression, and PTSD, panic attacks as evidenced by review of EMR and patient or social worker report experience decrease in ED visits as evidenced by EMR review.  ED visits in in last 6 months = 13 through collaboration with RN Care manager, provider, and care team-ED visit 7/18 for fatigue, insomnia  Interventions: Inter-disciplinary care team collaboration (see longitudinal plan of care) Evaluation of current treatment plan related to  self management and patient's adherence to plan as established by provider  Asthma: (Status:New goal.) Long Term Goal Advised patient to track and  manage Asthma triggers Advised patient to self assesses Asthma action plan zone and make appointment with provider if in the yellow zone for 48 hours without improvement Discussed the importance of adequate rest and management of fatigue with Asthma Assessed social determinant of health barriers  Collaborated with SW SW referral for anxiety/depression/panic attacks-completed  Patient Goals/Self-Care Activities: Take all medications as prescribed Attend all  scheduled provider appointments Call pharmacy for medication refills 3-7 days in advance of running out of medications Perform all self care activities independently  Perform IADL's (shopping, preparing meals, housekeeping, managing finances) independently Call provider office for new concerns or questions  Work with the social worker to address care coordination needs and will continue to work with the clinical team to address health care and disease management related needs Patient to contact Pharmacy and/or Provider regarding medication  Follow Up Plan:  The patient has been provided with contact information for the care management team and has been advised to call with any health related questions or concerns.  The care management team will reach out to the patient again over the next 30 business  days.    Long-Range Goal: Establish Plan of Care for Chronic Disease Management Needs   Priority: High  Note:   Timeframe:  Long-Range Goal Priority:  High Start Date:   03/07/22                          Expected End Date:     ongoing                  Follow Up Date 07/29/22   - schedule appointment for flu shot - schedule appointment for vaccines needed due to my age or health - schedule recommended health tests (blood work, mammogram, colonoscopy, pap test) - schedule and keep appointment for annual check-up    Why is this important?   Screening tests can find diseases early when they are easier to treat.  Your doctor or nurse will talk with you about which tests are important for you.  Getting shots for common diseases like the flu and shingles will help prevent them.  06/20/22:  Patient recently seen and evaluated by neuro, cards, heme and all WNL, has GYN appt 8/7     Follow Up:  Patient agrees to Care Plan and Follow-up.  Plan: The Managed Medicaid care management team will reach out to the patient again over the next 30 business  days. and The  Patient has been provided with  contact information for the Managed Medicaid care management team and has been advised to call with any health related questions or concerns.  Date/time of next scheduled RN care management/care coordination outreach: 07/27/22 at 315

## 2022-06-21 ENCOUNTER — Other Ambulatory Visit: Payer: Self-pay | Admitting: Licensed Clinical Social Worker

## 2022-06-21 NOTE — Patient Outreach (Signed)
Medicaid Managed Care Social Work Note  06/21/2022 Name:  Jasmine Gordon MRN:  [id number] DOB:  02/10/1986  Jasmine Gordon is an 36 y.o. year old female who is a primary patient of Jasmine Cords, DO.  The Medicaid Managed Care Coordination team was consulted for assistance with:  Mental Health Counseling and Resources  [name] was given information about Medicaid Managed Care Coordination team services today. Jasmine Gordon Patient agreed to services and verbal consent obtained.  Engaged with patient  for by telephone forfollow up visit in response to referral for case management and/or care coordination services.   Assessments/Interventions:  Review of past medical history, allergies, medications, health status, including review of consultants reports, laboratory and other test data, was performed as part of comprehensive evaluation and provision of chronic care management services.  SDOH: (Social Determinant of Health) assessments and interventions performed:   Advanced Directives Status:  See Care Plan for related entries.  Care Plan                 Allergies  Allergen Reactions   Reglan [Metoclopramide] Hives and Shortness Of Breath   Contrave [Naltrexone-Bupropion Hcl Er] Other (See Comments)    Had some reaction to the medication    Medications Reviewed Today     Reviewed by Jasmine Bryant, LCSW (Social Worker) on 06/21/22 at 1413  Med List Status: <None>   Medication Order Taking? Sig Documenting Provider Last Dose Status Informant  albuterol (VENTOLIN HFA) 108 (90 Base) MCG/ACT inhaler [id number] No Inhale 2 puffs into the lungs every 6 (six) hours as needed for wheezing or shortness of breath. Jasmine Cords, DO Taking Active   cetirizine (ZYRTEC) 10 MG tablet [id number] No Take 10 mg by mouth daily as needed for allergies. [provider] Taking Active   chlorhexidine (PERIDEX) 0.12 % solution [id number] No Use as  directed 10 mLs in the mouth or throat. [provider] Taking Active   FLUoxetine (PROZAC) 10 MG capsule [id number] No Take by mouth. [provider] Taking Active   FLUoxetine (PROZAC) 20 MG capsule [id number] No Take 1 capsule (20 mg total) by mouth daily.  Patient not taking: Reported on 05/12/2022   Jasmine Hotter, MD Not Taking Expired 06/12/22 2359   fluticasone (FLONASE) 50 MCG/ACT nasal spray [id number] No Place 1 spray into both nostrils daily. Use for 4-6 weeks then stop and use seasonally or as needed. Jasmine Munroe, NP Taking Active   hydrOXYzine (ATARAX) 10 MG tablet [id number] No Take 1 tablet (10 mg total) by mouth 3 (three) times daily as needed for anxiety. Jasmine Cords, DO Taking Active   hydrOXYzine (ATARAX) 25 MG tablet [id number] No Take by mouth. [provider] Taking Active   metoprolol tartrate (LOPRESSOR) 25 MG tablet [id number] No Take 0.5 tablets (12.5 mg total) by mouth 2 (two) times daily.  Patient not taking: Reported on 06/14/2022   Gordon, Jasmine H, PA-C Not Taking Active   montelukast (SINGULAIR) 10 MG tablet [id number] No Take 10 mg by mouth at bedtime as needed. [provider] Taking Active   Multiple Vitamins-Minerals (ONE-A-DAY WOMENS PO) [id number] No Take 1 tablet by mouth daily. [provider] Taking Active   OVER THE COUNTER MEDICATION [id number] No Goli ashwaganda gummy 1/2 daily [provider] Taking Active   pantoprazole (PROTONIX) 40 MG tablet [id number] No Take 1 tablet (40 mg total) by mouth daily. Jasmine Gordon, Jasmine H, PA-C Taking Active   [employer] (  AEROCHAMBER MV) inhaler [id number] No Use as instructed Jasmine Hauser, DO Taking Active   Vitamin D, Ergocalciferol, (DRISDOL) 1.25 MG (50000 UNIT) CAPS capsule [id number] No Take 1 capsule (50,000 Units total) by mouth every 7 (seven) days. Jasmine Hauser, DO Taking Active             Patient Active  Problem List   Diagnosis Date Noted   Vitamin B12 deficiency 06/09/2022   Palpitations 01/21/2022   Shortness of breath 01/21/2022   Chest pain 01/21/2022   Generalized anxiety disorder with panic attacks 09/27/2021   Environmental and seasonal allergies 11/04/2020   Genital warts 09/11/2018   Positive TB test 09/25/2017   Allergic contact dermatitis due to metals 05/17/2017   Pre-diabetes 03/06/2017   Hyperlipidemia 03/06/2017   Allergic reaction 02/08/2017   Urticaria due to food allergy 02/08/2017   Vitamin D deficiency 11/08/2016   Iron deficiency anemia due to chronic blood loss 10/03/2016   Menorrhagia with regular cycle 06/27/2016   Mild persistent asthma without complication 0000000   Anxiety and depression 06/17/2016   Morbid obesity ([place]) 06/17/2016   PTSD (post-traumatic stress disorder) 2016    Conditions to be addressed/monitored per PCP order:  [place] : LCSW Plan of Care  Updates made by Jasmine Cutter, LCSW since 06/21/2022 12:00 AM     Problem: Anxiety Identification (Anxiety)      Long-Range Goal: To find a long term counselor in order to learn how to alleviate my anxiety and stress   Start Date: 02/14/2022  Priority: High  Note:   Priority: High  Timeframe:  Long-Range Goal Priority:  High Start Date:   02/14/22               Expected End Date:  ongoing. Goal reopened on 06/21/22 as patient prefers to speak directly to [facility] LCSW for mental health support, resources and coping skill education,   Follow Up Date---06/29/22  - check out counseling - keep 90 percent of counseling appointments - schedule counseling appointment    Why is this important?             Beating depression may take some time.            If you don't feel better right away, don't give up on your treatment plan.    Current barriers:   Chronic Mental Health needs related to anxiety, panic and stress management  Mental Health Concerns  and Social Isolation Needs  Support, Education, and Care Coordination in order to meet unmet mental health needs.  Clinical Goal(s): verbalize understanding of plan for management of Anxiety, Panic and Stress     Clinical Interventions:  Assessed patient's previous and current treatment, coping skills, support system and barriers to care. Patient and spouse provided hx  Verbalization of feelings encouraged, motivational interviewing employed Emotional support provided, positive coping strategies explored Self care/establishing healthy boundaries emphasized Patient reports that her anxiety has increased which has affected her ability to carry out work and function daily.  Patient receives strong support from spouse Patient is agreeable to referral to [facility] for counseling. Patient has already started psychiatry treatment there. [facility] LCSW made referral on 02/14/22. Update 03/14/22- Patient continues to receive psychiatry from [facility] but they do not have any available therapist for counseling. Patient is in need of counseling as her stress has increased since she had to start back work last week. She reports that her disability is still processing and she is very  frustrated with this process. [facility] LCSW sent patient a secure email with [facility] contact information. She is agreeable to contact them to schedule an intake appointment for counseling. Patient did not want [facility]. She reports that she cannot talk long because she is back at work today and experiencing extreme stress. Emotional support provided to patient. UPDATE 05/18/22- Patient completed her first counseling session at [facility] where she receives psychiatry services at. Patient reports that her mental health has improved drastically and her counselor suggested a follow up counseling appointment in August. Her counseling session was on 05/16/22. She has a psychiatry appointment tomorrow. She reports being satisfied with her current mental health support network team. She ask that [facility]  LCSW email her with contact information in case any mental health concerns arise in the future. Patient is agreeable to social work case closure. [place] LCSW will update [facility] RNCM.  Patient recently started back work which has negatively affected her mental health.  Patient reports that she experiences both anxiety and panic attacks. She shares that she felt alone most days until she joined an anxiety management support group on Facebook. She was receptive to anxiety and depression management coping skill education. LCSW provided education on relaxation techniques such as meditation, deep breathing, massage, grounding exercsies or yoga that can activate the body's relaxation response and ease symptoms of stress and anxiety. LCSW ask that when pt is struggling with difficult emotions and racing thoughts that they start this relaxation response process. LCSW provided extensive education on healthy coping skills for anxiety. SW used active and reflective listening, validated patient's feelings/concerns, and provided emotional support.  Motivational Interviewing employed Depression screen reviewed  PHQ2/ PHQ9 completed Mindfulness or Relaxation training provided Active listening / Reflection utilized  Emotional Support Provided Problem Flushing strategies reviewed Provided psychoeducation for mental health needs  Provided brief CBT  Reviewed mental health medications and discussed importance of compliance:  Quality of sleep assessed & Sleep Hygiene techniques promoted  Participation in counseling encouraged  Verbalization of feelings encouraged  Suicidal Ideation/Homicidal Ideation assessed: Patient denies SI/HI  Review resources, discussed options and provided patient information about  [facility] care team collaboration (see longitudinal plan of care) UPDATE 04/13/22- Patient has completed counseling session at [facility] and has upcoming appointment on 05/16/22.  However, patient was a no show for her psychiatry appointment and did not reschedule this. [facility] LCSW completed joint call with patient to [facility] and was able to schedule her psychiatry appointment for today at 4:00 pm. This appointment will be virtual. [facility] LCSW emailed patient a list of coping skills for anxiety, depression and stress which include the following: When your car dies or a deadline looms, how do you respond? Long-term, low-grade or acute stress takes a serious toll on your body and mind, so don't ignore feelings of constant tension. Stress is a natural part of life. However, too much stress can harm our health, especially if it continues every day. This is chronic stress and can put you at risk for heart problems like heart disease and depression. Understand what's happening inside your body and learn simple coping skills to combat the negative impacts of everyday stressors. UPDATE 06/21/22- Patient reports that she does NOT wish to continue psychiatry services at [facility] because she did not form good rapport with psychiatrist there. She reports that she prefers for Dr. Raliegh Ip (her pcp) to manage her mental health medications.  Types of Stress There are two types of stress: Emotional -  types of emotional stress are relationship problems, pressure at work, financial worries, experiencing discrimination or having a major life change. Physical - Examples of physical stress include being sick having pain, not sleeping well, recovery from an injury or having an alcohol and drug use disorder. Fight or Flight Sudden or ongoing stress activates your nervous system and floods your bloodstream with adrenaline and cortisol, two hormones that raise blood pressure, increase heart rate and spike blood sugar. These changes pitch your body into a fight or flight response. That enabled our ancestors to outrun saber-toothed tigers, and it's helpful today for situations like dodging a car accident. But most modern chronic  stressors, such as finances or a challenging relationship, keep your body in that heightened state, which hurts your health. Effects of Too Much Stress If constantly under stress, most of [place] will eventually start to function less well.  Multiple studies link chronic stress to a higher risk of heart disease, stroke, depression, weight gain, memory loss and even premature death, so it's important to recognize the warning signals. Talk to your doctor about ways to manage stress if you're experiencing any of these symptoms: Prolonged periods of poor sleep. Regular, severe headaches. Unexplained weight loss or gain. Feelings of isolation, withdrawal or worthlessness. Constant anger and irritability. Loss of interest in activities. Constant worrying or obsessive thinking. Excessive alcohol or drug use. Inability to concentrate.  10 Ways to Cope with Chronic Stress It's key to recognize stressful situations as they occur because it allows you to focus on managing how you react. We all need to know when to close our eyes and take a deep breath when we feel tension rising. Use these tips to prevent or reduce chronic stress. 1. Rebalance Work and Home All work and no play? If you're spending too much time at the office, intentionally put more dates in your calendar to enjoy time for fun, either alone or with others. 2. Get Regular Exercise Moving your body on a regular basis balances the nervous system and increases blood circulation, helping to flush out stress hormones. Even a daily 20-minute walk makes a difference. Any kind of exercise can lower stress and improve your mood ? just pick activities that you enjoy and make it a regular habit. 3. Eat Well and Limit Alcohol and Stimulants Alcohol, nicotine and caffeine may temporarily relieve stress but have negative health impacts and can make stress worse in the long run. Well-nourished bodies cope better, so start with a good breakfast, add more  organic fruits and vegetables for a well-balanced diet, avoid processed foods and sugar, try herbal tea and drink more water. 4. Connect with Supportive People Talking face to face with another person releases hormones that reduce stress. Lean on those good listeners in your life. 5. Carve Out Hobby Time Do you enjoy gardening, reading, listening to music or some other creative pursuit? Engage in activities that bring you pleasure and joy; research shows that reduces stress by almost half and lowers your heart rate, too. 6. Practice Meditation, Stress Reduction or Yoga Relaxation techniques activate a state of restfulness that counterbalances your body's fight-or-flight hormones. Even if this also means a 10-minute break in a long day: listen to music, read, go for a walk in nature, do a hobby, take a bath or spend time with a friend. Also consider taking a mindfulness-based stress reduction course to learn effective, lasting tools or try a daily deep breathing or imagery practice. Deep Breathing Slow, calm and deep  breathing can help you relax. Try these steps to focus on your breathing and repeat as needed. Find a comfortable position and close your eyes. Exhale and drop your shoulders. Breathe in through your nose; fill your lungs and then your belly. Think of relaxing your body, quieting your mind and becoming calm and peaceful. Breathe out slowly through your nose, relaxing your belly. Think of releasing tension, pain, worries or distress. Repeat steps three and four until you feel relaxed. Imagery This involves using your mind to excite the senses -- sound, vision, smell, taste and feeling. This may help ease your stress. Begin by getting comfortable and then do some slow breathing. Imagine a place you love being at. It could be somewhere from your childhood, somewhere you vacationed or just a place in your imagination. Feel how it is to be in the place you're imagining. Pay attention to the  sounds, air, colors, and who is there with you. This is a place where you feel cared for and loved. All is well. You are safe. Take in all the smells, sounds, tastes and feelings. As you do, feel your body being nourished and healed. Feel the calm that surrounds you. Breathe in all the good. Breathe out any discomfort or tension. 7. Sleep Enough If you get less than seven to eight hours of sleep, your body won't tolerate stress as well as it could. If stress keeps you up at night, address the cause and add extra meditation into your day to make up for the lost z's. Try to get seven to nine hours of sleep each night. Make a regular bedtime schedule. Keep your room dark and cool. Try to avoid computers, TV, cell phones and tablets before bed. 8. Bond with Connections You Enjoy Go out for a coffee with a friend, chat with a neighbor, call a family member, visit with a clergy member, or even hang out with your pet. Clinical studies show that spending even a short time with a companion animal can cut anxiety levels almost in half. 9. Take a Vacation Getting away from it all can reset your stress tolerance by increasing your mental and emotional outlook, which makes you a happier, more productive person upon return. Leave your cellphone and laptop at home! 10. See a Counselor, Coach or Therapist If negative thoughts overwhelm your ability to make positive changes, it's time to seek professional help. Make an appointment today--your health and life are worth it.  Patient Goals/Self-Care Activities: Over the next 120 days Attend scheduled medical appointments Utilize healthy coping skills and supportive resources discussed Contact PCP with any questions or concerns  Follow up goal     Follow up:  Patient agrees to Care Plan and Follow-up.  Plan: The Managed Medicaid care management team will reach out to the patient again over the next 30 days.  Date/time of next scheduled Social Work care  management/care coordination outreach:  06/29/22 at 1:00 pm.  Eula Fried, BSW, MSW, [place] Medicaid LCSW [email or web address] Phone: [phone]

## 2022-06-21 NOTE — Patient Instructions (Signed)
Visit Information  Jasmine Gordon was given information about Medicaid Managed Care team care coordination services as a part of their Healthy [facility] Medicaid benefit. Jasmine Gordon verbally consented to engagement with the [facility] Managed Care team.   If you are experiencing a medical emergency, please call 911 or report to your local emergency department or urgent care.   If you have a non-emergency medical problem during routine business hours, please contact your provider's office and ask to speak with a nurse.   For questions related to your Healthy [facility] health plan, please call: [phone] or visit the homepage here: [email or web address]  If you would like to schedule transportation through your Healthy [facility] plan, please call the following number at least 2 days in advance of your appointment: [phone]  For information about your ride after you set it up, call Ride Assist at [phone]. Use this number to activate a Will Call pickup, or if your transportation is late for a scheduled pickup. Use this number, too, if you need to make a change or cancel a previously scheduled reservation.  If you need transportation services right away, call [phone]. The after-hours call center is staffed 24 hours to handle ride assistance and urgent reservation requests (including discharges) 365 days a year. Urgent trips include sick visits, hospital discharge requests and life-sustaining treatment.  Call the [facility] Line at [phone], at any time, 24 hours a day, 7 days a week. If you are in danger or need immediate medical attention call 911.  If you would like help to quit smoking, call 1-800-QUIT-NOW (910-546-4281) OR Espaol: 1-855-Djelo-Ya (9-407-680-8811) o para ms informacin haga clic aqu or Text READY to 031-594 to register via text  Following is a copy of your plan of care:  Care Plan : LCSW Plan of  Care  Updates made by Gustavus Bryant, LCSW since 06/21/2022 12:00 AM     Problem: Anxiety Identification (Anxiety)      Long-Range Goal: To find a long term counselor in order to learn how to alleviate my anxiety and stress   Start Date: 02/14/2022  Priority: High  Note:   Priority: High  Timeframe:  Long-Range Goal Priority:  High Start Date:   02/14/22               Expected End Date:  ongoing. Goal reopened on 06/21/22 as patient prefers to speak directly to [facility] LCSW for mental health support, resources and coping skill education,   Follow Up Date---06/29/22  - check out counseling - keep 90 percent of counseling appointments - schedule counseling appointment    Why is this important?             Beating depression may take some time.            If you don't feel better right away, don't give up on your treatment plan.    Current barriers:   Chronic Mental Health needs related to anxiety, panic and stress management  Mental Health Concerns  and Social Isolation Needs Support, Education, and Care Coordination in order to meet unmet mental health needs.  Clinical Goal(s): verbalize understanding of plan for management of Anxiety, Panic and Stress     Patient Goals/Self-Care Activities: Over the next 120 days Attend scheduled medical appointments Utilize healthy coping skills and supportive resources discussed Contact PCP with any questions or concerns  Follow up goal  Dickie La, BSW, MSW, [employer] Managed Medicaid LCSW [facility]  [facility]  Network Museum/gallery [email or web address] Phone: [phone]

## 2022-06-22 DIAGNOSIS — H5213 Myopia, bilateral: Secondary | ICD-10-CM | POA: Diagnosis not present

## 2022-06-27 ENCOUNTER — Ambulatory Visit: Payer: Medicaid Other

## 2022-06-27 ENCOUNTER — Ambulatory Visit (INDEPENDENT_AMBULATORY_CARE_PROVIDER_SITE_OTHER): Payer: Medicaid Other | Admitting: Licensed Clinical Social Worker

## 2022-06-27 ENCOUNTER — Ambulatory Visit: Payer: Medicaid Other | Admitting: Obstetrics & Gynecology

## 2022-06-27 DIAGNOSIS — F4310 Post-traumatic stress disorder, unspecified: Secondary | ICD-10-CM

## 2022-06-27 NOTE — Plan of Care (Signed)
  Problem: PTSD-Trauma Disorder Goal: LTG: Reduce the negative impact trauma related symptoms have on social, occupational, and family functioning per pt self report 3 out of 5 sessions documented.  Outcome: Progressing Goal: STG: Reduction in intrusive event recollections, avoidance of event reminders, intense arousal, or disinterest in activities or relationships. 3 out of 5 sessions documented Outcome: Progressing Intervention: Encourage verbalization of feelings/concerns/expectations Note: Discussed mini action plan: follow up with disability attorney, explore courses of study, set limits/boundaries with children, engage in self care activities (reading, working out at [employer]) Intervention: Encourage compliance with prescribed medication regimen Note: Encouraged compliance

## 2022-06-27 NOTE — Progress Notes (Signed)
Virtual Visit via Video Note  I connected with Chrisanna [name] on 06/27/22 at  9:00 AM EDT by a video enabled telemedicine application and verified that I am speaking with the correct person using two identifiers.  Location: Patient: home Provider: remote office [place], [place])   I discussed the limitations of evaluation and management by telemedicine and the availability of in person appointments. The patient expressed understanding and agreed to proceed.   I discussed the assessment and treatment plan with the patient. The patient was provided an opportunity to ask questions and all were answered. The patient agreed with the plan and demonstrated an understanding of the instructions.   The patient was advised to call back or seek an in-person evaluation if the symptoms worsen or if the condition fails to improve as anticipated.  I provided 45 minutes of non-face-to-face time during this encounter.   Parnell Spieler R Phinley Schall, LCSW   THERAPIST PROGRESS NOTE  Session Time: [phone]  Participation Level: Active  Behavioral Response: Neat and Well GroomedAlertAnxious  Type of Therapy: Individual Therapy  Treatment Goals addressed: PProblem: PTSD-Trauma Disorder Goal: LTG: Reduce the negative impact trauma related symptoms have on social, occupational, and family functioning per pt self report 3 out of 5 sessions documented.  Outcome: Progressing Goal: STG: Reduction in intrusive event recollections, avoidance of event reminders, intense arousal, or disinterest in activities or relationships. 3 out of 5 sessions documented Outcome: Progressing Intervention: Encourage verbalization of feelings/concerns/expectations Note: Discussed mini action plan: follow up with disability attorney, explore courses of study, set limits/boundaries with children, engage in self care activities (reading, working out at [employer]) Intervention: Encourage compliance with prescribed medication regimen Note:  Encouraged compliance    ProgressTowards Goals: Initial  Interventions: CBT  Summary: Moya Hanin Decook is a 36 y.o. female who presents with improving symptoms related to PTSD and anxiety. .   Allowed pt to explore and express thoughts and feelings associated with recent life situations and external stressors. Patient reports that she is continuing to struggle regarding her disability case. Patient reports that she contacted her attorney recently and was told that the attorney was working on higher priority cases currently. Patient reports that she is fearful that she will lose her car, and has received some threatening phone calls from the car finance company.  Patient reports that she has some stress associated with her son sending text messages to a friend's daughter. Patient reports that her son is in high school and the daughter is in elementary school. Patient reports that she had told her friend that she did not want her son and the daughter having anything to do with one another, even if that means separating their friendship temporarily. Patient reports that her friend got very upset about this, but patient is very firm in her beliefs.  Patient is excited about new membership at [employer], and plans on working out more. Patient reports that sometimes she spends a lot of time in bed on her days off. Encouraged patient to get up, get moving, and focus on physical activity or being active inside the home found days off.  Patient is continuing to work on a part time basis, and is being called in for PRN shift. 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.  Overall  patient states that mood, and anxiety management has improved since last session. Patient reports that her sleep quality and  quantity is still inconsistent. Reviewed sleep hygiene techniques.  Continued recommendations are as follows: self care behaviors, positive social engagements, focusing on overall work/home/life balance, and focusing on positive physical and emotional wellness.   Suicidal/Homicidal: No  Therapist Response: Pt is continuing to apply interventions learned in session into daily life situations. Pt is currently on track to meet goals utilizing interventions mentioned above. Personal growth and progress noted. Treatment to continue as indicated.   Plan: Return again in 4 weeks.  Diagnosis:  Encounter Diagnosis  Name Primary?   PTSD (post-traumatic stress disorder) Yes     Collaboration of Care: Other Pt to continue care with psychiatrist of record, Dr. Vanetta Shawl  Patient/Guardian was advised Release of Information must be obtained prior to any record release in order to collaborate their care with an outside provider. Patient/Guardian was advised if they have not already done so to contact the registration department to sign all necessary forms in order for [place] to release information regarding their care.   Consent: Patient/Guardian gives verbal consent for treatment and assignment of benefits for services provided during this visit. Patient/Guardian expressed understanding and agreed to proceed.   Ernest Haber Jatoria Kneeland, LCSW 06/27/2022

## 2022-06-27 NOTE — Plan of Care (Signed)
  Problem: PTSD-Trauma Disorder Goal: LTG: Reduce the negative impact trauma related symptoms have on social, occupational, and family functioning per pt self report 3 out of 5 sessions documented.  Outcome: Progressing Goal: STG: Reduction in intrusive event recollections, avoidance of event reminders, intense arousal, or disinterest in activities or relationships. 3 out of 5 sessions documented Outcome: Progressing Intervention: Encourage verbalization of feelings/concerns/expectations Note: Discussed mini action plan: follow up with disability attorney, explore courses of study, set limits/boundaries with children, engage in self care activities (reading, working out at [employer]) Intervention: Encourage compliance with prescribed medication regimen Note: Pt reports

## 2022-06-29 ENCOUNTER — Other Ambulatory Visit: Payer: Self-pay | Admitting: Licensed Clinical Social Worker

## 2022-06-29 NOTE — Patient Outreach (Addendum)
Medicaid Managed Care Social Work Note  06/29/2022 Name:  Jasmine Gordon MRN:  [id number] DOB:  1986/03/13  Jasmine Gordon is an 36 y.o. year old female who is a primary patient of [name], DO.  The Medicaid Managed Care Coordination team was consulted for assistance with:  [place] and Resources  [name] was given information about Medicaid Managed Care Coordination team services today. [name] agreed to services and verbal consent obtained.  Engaged with patient  for by telephone forfollow up visit in response to referral for case management and/or care coordination services.   Assessments/Interventions:  Review of past medical history, allergies, medications, health status, including review of consultants reports, laboratory and other test data, was performed as part of comprehensive evaluation and provision of chronic care management services.  SDOH: (Social Determinant of Health) assessments and interventions performed: SDOH Interventions    Flowsheet Row Most Recent Value  SDOH Interventions   Stress Interventions Offered [employer], Live Life Well       Advanced Directives Status:  See Care Plan for related entries.  Care Plan                 Allergies  Allergen Reactions   Reglan [Metoclopramide] Hives and Shortness Of Breath   Contrave [Naltrexone-Bupropion Hcl Er] Other (See Comments)    Had some reaction to the medication    Medications Reviewed Today     Reviewed by Greg Cutter, LCSW (Social Worker) on 06/29/22 at [place] List Status: <None>   Medication Order Taking? Sig Documenting Provider Last Dose Status Informant  albuterol (VENTOLIN HFA) 108 (90 Base) MCG/ACT inhaler [id number] No Inhale 2 puffs into the lungs every 6 (six) hours as needed for wheezing or shortness of breath. [name], DO Taking Active   cetirizine (ZYRTEC) 10 MG tablet [id number]  No Take 10 mg by mouth daily as needed for allergies. [provider] Taking Active   chlorhexidine (PERIDEX) 0.12 % solution [id number] No Use as directed 10 mLs in the mouth or throat. [provider] Taking Active   FLUoxetine (PROZAC) 10 MG capsule [id number] No Take by mouth. [provider] Taking Active   FLUoxetine (PROZAC) 20 MG capsule [id number] No Take 1 capsule (20 mg total) by mouth daily.  Patient not taking: Reported on 05/12/2022   Norman Clay, MD Not Taking Expired 06/12/22 2359   fluticasone (FLONASE) 50 MCG/ACT nasal spray [id number] No Place 1 spray into both nostrils daily. Use for 4-6 weeks then stop and use seasonally or as needed. Jearld Fenton, NP Taking Active   hydrOXYzine (ATARAX) 10 MG tablet [id number] No Take 1 tablet (10 mg total) by mouth 3 (three) times daily as needed for anxiety. [name], DO Taking Active   hydrOXYzine (ATARAX) 25 MG tablet [id number] No Take by mouth. [provider] Taking Active   metoprolol tartrate (LOPRESSOR) 25 MG tablet [id number] No Take 0.5 tablets (12.5 mg total) by mouth 2 (two) times daily.  Patient not taking: Reported on 06/14/2022   Furth, Cadence H, PA-C Not Taking Active   montelukast (SINGULAIR) 10 MG tablet [id number] No Take 10 mg by mouth at bedtime as needed. [provider] Taking Active   Multiple Vitamins-Minerals (ONE-A-DAY WOMENS PO) [id number] No Take 1 tablet by mouth daily. [provider] Taking Active   OVER THE COUNTER MEDICATION [id number] No [place]oli ashwaganda gummy 1/2 daily [provider] Taking Active  pantoprazole (PROTONIX) 40 MG tablet [id number] No Take 1 tablet (40 mg total) by mouth daily. Kathlen Mody, Cadence H, PA-C Taking Active   Spacer/Aero-Holding Chambers (AEROCHAMBER MV) inhaler [id number] No Use as instructed [name], DO Taking Active   Vitamin D, Ergocalciferol, (DRISDOL) 1.25 MG (50000 UNIT) CAPS capsule  [id number] No Take 1 capsule (50,000 Units total) by mouth every 7 (seven) days. [name], DO Taking Active             Patient Active Problem List   Diagnosis Date Noted   Vitamin B12 deficiency 06/09/2022   Palpitations 01/21/2022   Shortness of breath 01/21/2022   Chest pain 01/21/2022   Generalized anxiety disorder with panic attacks 09/27/2021   Environmental and seasonal allergies 11/04/2020   Genital warts 09/11/2018   Positive TB test 09/25/2017   Allergic contact dermatitis due to metals 05/17/2017   Pre-diabetes 03/06/2017   Hyperlipidemia 03/06/2017   Allergic reaction 02/08/2017   Urticaria due to food allergy 02/08/2017   Vitamin D deficiency 11/08/2016   Iron deficiency anemia due to chronic blood loss 10/03/2016   Menorrhagia with regular cycle 06/27/2016   Mild persistent asthma without complication 0000000   Anxiety and depression 06/17/2016   Morbid obesity ([place]) 06/17/2016   PTSD (post-traumatic stress disorder) 2016    Conditions to be addressed/monitored per PCP order:  [name] : LCSW Plan of Care  Updates made by Greg Cutter, LCSW since 06/29/2022 12:00 AM     Problem: Anxiety Identification (Anxiety)      Long-Range Goal: To find a long term counselor in order to learn how to alleviate my anxiety and stress   Start Date: 02/14/2022  Priority: High  Note:   Priority: High  Timeframe:  Long-Range Goal Priority:  High Start Date:   02/14/22               Expected End Date:  ongoing. Goal reopened on 06/21/22 as patient prefers to speak directly to [facility] LCSW for mental health support, resources and coping skill education,   Follow Up Date---07/13/22  - check out counseling - keep 90 percent of counseling appointments - schedule counseling appointment    Why is this important?             Beating depression may take some time.            If you don't feel better right away, don't give up on your treatment plan.     Current barriers:   Chronic Mental Health needs related to anxiety, panic and stress management  Mental Health Concerns  and Social Isolation Needs Support, Education, and Care Coordination in order to meet unmet mental health needs.  Clinical Goal(s): verbalize understanding of plan for management of Anxiety, Panic and Stress     Clinical Interventions:  Assessed patient's previous and current treatment, coping skills, support system and barriers to care. Patient and spouse provided hx  Verbalization of feelings encouraged, motivational interviewing employed Emotional support provided, positive coping strategies explored Self care/establishing healthy boundaries emphasized Patient reports that her anxiety has increased which has affected her ability to carry out work and function daily.  Patient receives strong support from spouse Patient is agreeable to referral to [facility] for counseling. Patient has already started psychiatry treatment there. [facility] LCSW made referral on 02/14/22. Update 03/14/22- Patient continues to receive psychiatry from [facility] but they do not have any available therapist for counseling. Patient is in need of counseling  as her stress has increased since she had to start back work last week. She reports that her disability is still processing and she is very frustrated with this process. [facility] LCSW sent patient a secure email with [facility] contact information. She is agreeable to contact them to schedule an intake appointment for counseling. Patient did not want [facility]. She reports that she cannot talk long because she is back at work today and experiencing extreme stress. Emotional support provided to patient. UPDATE 05/18/22- Patient completed her first counseling session at [facility] where she receives psychiatry services at. Patient reports that her mental health has improved drastically and her counselor suggested a follow up counseling appointment in August. Her counseling session  was on 05/16/22. She has a psychiatry appointment tomorrow. She reports being satisfied with her current mental health support network team. She ask that [facility] LCSW email her with contact information in case any mental health concerns arise in the future. Patient is agreeable to social work case closure. [place] LCSW will update [facility] RNCM. UPDATE- 06/29/22, patient is requesting community resource assistance. Referral placed to [facility] BSW and appointment was scheduled for next week. Patient is experiencing ongoing financial stressors and is at risk of losing her source of transportation which would affect her ability to keep appointments. [facility] LCSW questioned if patient was able to find a different psychiatrist at [facility] as she was unable to build a strong rapport with her last one. Patient will contact [place] today to reschedule psychiatry appointment that she missed and to question about this. [facility] LCSW will follow up in 14 days to make sure patient was able to be established with a psychiatrist. Patient successfully completed therapy session with her [facility] counselor on 06/27/22. Patient recently started back work which has negatively affected her mental health.  Patient reports that she experiences both anxiety and panic attacks. She shares that she felt alone most days until she joined an anxiety management support group on Facebook. She was receptive to anxiety and depression management coping skill education. LCSW provided education on relaxation techniques such as meditation, deep breathing, massage, grounding exercsies or yoga that can activate the body's relaxation response and ease symptoms of stress and anxiety. LCSW ask that when pt is struggling with difficult emotions and racing thoughts that they start this relaxation response process. LCSW provided extensive education on healthy coping skills for anxiety. SW used active and reflective listening, validated patient's feelings/concerns, and provided emotional  support.  Motivational Interviewing employed Depression screen reviewed  PHQ2/ PHQ9 completed Mindfulness or Relaxation training provided Active listening / Reflection utilized  Emotional Support Provided Problem [place] strategies reviewed Provided psychoeducation for mental health needs  Provided brief CBT  Reviewed mental health medications and discussed importance of compliance:  Quality of sleep assessed & Sleep Hygiene techniques promoted  Participation in counseling encouraged  Verbalization of feelings encouraged  Suicidal Ideation/Homicidal Ideation assessed: Patient denies SI/HI  Review resources, discussed options and provided patient information about  [place] care team collaboration (see longitudinal plan of care) UPDATE 04/13/22- Patient has completed counseling session at [facility] and has upcoming appointment on 05/16/22. However, patient was a no show for her psychiatry appointment and did not reschedule this. [place] LCSW completed joint call with patient to [facility] and was able to schedule her psychiatry appointment for today at 4:00 pm. This appointment will be virtual. [facility] LCSW emailed patient a list of coping skills for anxiety, depression and stress which include the following:  When your car dies or a deadline looms, how do you respond? Long-term, low-grade or acute stress takes a serious toll on your body and mind, so don't ignore feelings of constant tension. Stress is a natural part of life. However, too much stress can harm our health, especially if it continues every day. This is chronic stress and can put you at risk for heart problems like heart disease and depression. Understand what's happening inside your body and learn simple coping skills to combat the negative impacts of everyday stressors. UPDATE 06/21/22- Patient reports that she does NOT wish to continue psychiatry services at [facility] because she did not form good rapport with  psychiatrist there. She reports that she prefers for Dr. Kirtland Bouchard (her pcp) to manage her mental health medications.  Types of Stress There are two types of stress: Emotional - types of emotional stress are relationship problems, pressure at work, financial worries, experiencing discrimination or having a major life change. Physical - Examples of physical stress include being sick having pain, not sleeping well, recovery from an injury or having an alcohol and drug use disorder. Fight or Flight Sudden or ongoing stress activates your nervous system and floods your bloodstream with adrenaline and cortisol, two hormones that raise blood pressure, increase heart rate and spike blood sugar. These changes pitch your body into a fight or flight response. That enabled our ancestors to outrun saber-toothed tigers, and it's helpful today for situations like dodging a car accident. But most modern chronic stressors, such as finances or a challenging relationship, keep your body in that heightened state, which hurts your health. Effects of Too Much Stress If constantly under stress, most of [place] will eventually start to function less well.  Multiple studies link chronic stress to a higher risk of heart disease, stroke, depression, weight gain, memory loss and even premature death, so it's important to recognize the warning signals. Talk to your doctor about ways to manage stress if you're experiencing any of these symptoms: Prolonged periods of poor sleep. Regular, severe headaches. Unexplained weight loss or gain. Feelings of isolation, withdrawal or worthlessness. Constant anger and irritability. Loss of interest in activities. Constant worrying or obsessive thinking. Excessive alcohol or drug use. Inability to concentrate.  10 Ways to Cope with Chronic Stress It's key to recognize stressful situations as they occur because it allows you to focus on managing how you react. We all need to know when to close our  eyes and take a deep breath when we feel tension rising. Use these tips to prevent or reduce chronic stress. 1. Rebalance Work and Home All work and no play? If you're spending too much time at the office, intentionally put more dates in your calendar to enjoy time for fun, either alone or with others. 2. Get Regular Exercise Moving your body on a regular basis balances the nervous system and increases blood circulation, helping to flush out stress hormones. Even a daily 20-minute walk makes a difference. Any kind of exercise can lower stress and improve your mood ? just pick activities that you enjoy and make it a regular habit. 3. Eat Well and Limit Alcohol and Stimulants Alcohol, nicotine and caffeine may temporarily relieve stress but have negative health impacts and can make stress worse in the long run. Well-nourished bodies cope better, so start with a good breakfast, add more organic fruits and vegetables for a well-balanced diet, avoid processed foods and sugar, try herbal tea and drink more water. 4. Connect with Supportive  People Talking face to face with another person releases hormones that reduce stress. Lean on those good listeners in your life. 5. [employer] Time Do you enjoy gardening, reading, listening to music or some other creative pursuit? Engage in activities that bring you pleasure and joy; research shows that reduces stress by almost half and lowers your heart rate, too. 6. Practice Meditation, Stress Reduction or Yoga Relaxation techniques activate a state of restfulness that counterbalances your body's fight-or-flight hormones. Even if this also means a 10-minute break in a long day: listen to music, read, go for a walk in nature, do a hobby, take a bath or spend time with a friend. Also consider taking a mindfulness-based stress reduction course to learn effective, lasting tools or try a daily deep breathing or imagery practice. Deep Breathing Slow, calm and deep  breathing can help you relax. Try these steps to focus on your breathing and repeat as needed. Find a comfortable position and close your eyes. Exhale and drop your shoulders. Breathe in through your nose; fill your lungs and then your belly. Think of relaxing your body, quieting your mind and becoming calm and peaceful. Breathe out slowly through your nose, relaxing your belly. Think of releasing tension, pain, worries or distress. Repeat steps three and four until you feel relaxed. Imagery This involves using your mind to excite the senses -- sound, vision, smell, taste and feeling. This may help ease your stress. Begin by getting comfortable and then do some slow breathing. Imagine a place you love being at. It could be somewhere from your childhood, somewhere you vacationed or just a place in your imagination. Feel how it is to be in the place you're imagining. Pay attention to the sounds, air, colors, and who is there with you. This is a place where you feel cared for and loved. All is well. You are safe. Take in all the smells, sounds, tastes and feelings. As you do, feel your body being nourished and healed. Feel the calm that surrounds you. Breathe in all the good. Breathe out any discomfort or tension. 7. Sleep Enough If you get less than seven to eight hours of sleep, your body won't tolerate stress as well as it could. If stress keeps you up at night, address the cause and add extra meditation into your day to make up for the lost z's. Try to get seven to nine hours of sleep each night. Make a regular bedtime schedule. Keep your room dark and cool. Try to avoid computers, TV, cell phones and tablets before bed. 8. Bond with Connections You Enjoy Go out for a coffee with a friend, chat with a neighbor, call a family member, visit with a clergy member, or even hang out with your pet. Clinical studies show that spending even a short time with a companion animal can cut anxiety levels almost  in half. 9. Take a Vacation Getting away from it all can reset your stress tolerance by increasing your mental and emotional outlook, which makes you a happier, more productive person upon return. Leave your cellphone and laptop at home! 10. See a Counselor, Coach or Therapist If negative thoughts overwhelm your ability to make positive changes, it's time to seek professional help. Make an appointment today--your health and life are worth it.  Patient Goals/Self-Care Activities: Over the next 120 days Attend scheduled medical appointments Utilize healthy coping skills and supportive resources discussed Contact PCP with any questions or concerns  Follow up goal  Follow up:  Patient agrees to Care Plan and Follow-up.  Plan: The Managed Medicaid care management team will reach out to the patient again over the next 14 days.  Date/time of next scheduled Social Work care management/care coordination outreach:  07/13/22 at 1:00 pm.  Dickie La, BSW, MSW, LCSW Managed Medicaid LCSW [facility]  [facility] [email or web address] Phone: [phone]

## 2022-06-29 NOTE — Patient Instructions (Signed)
Visit Information  [name] was given information about Medicaid Managed Care team care coordination services as a part of their Healthy [facility] Medicaid benefit. Jasmine Gordon verbally consented to engagement with the [facility] Managed Care team.   If you are experiencing a medical emergency, please call 911 or report to your local emergency department or urgent care.   If you have a non-emergency medical problem during routine business hours, please contact your provider's office and ask to speak with a nurse.   For questions related to your Healthy [facility] health plan, please call: [phone] or visit the homepage here: [email or web address]  If you would like to schedule transportation through your Healthy [facility] plan, please call the following number at least 2 days in advance of your appointment: [phone]  For information about your ride after you set it up, call Ride Assist at [phone]. Use this number to activate a Will Call pickup, or if your transportation is late for a scheduled pickup. Use this number, too, if you need to make a change or cancel a previously scheduled reservation.  If you need transportation services right away, call [phone]. The after-hours call center is staffed 24 hours to handle ride assistance and urgent reservation requests (including discharges) 365 days a year. Urgent trips include sick visits, hospital discharge requests and life-sustaining treatment.  Call the [facility] Line at [phone], at any time, 24 hours a day, 7 days a week. If you are in danger or need immediate medical attention call 911.  If you would like help to quit smoking, call 1-800-QUIT-NOW (667-829-3698) OR Espaol: 1-855-Djelo-Ya (3-295-188-4166) o para ms informacin haga clic aqu or Text READY to 063-016 to register via text   Following is a copy of your plan of care:  Care Plan : LCSW Plan  of Care  Updates made by Gustavus Bryant, LCSW since 06/29/2022 12:00 AM     Problem: Anxiety Identification (Anxiety)      Long-Range Goal: To find a long term counselor in order to learn how to alleviate my anxiety and stress   Start Date: 02/14/2022  Priority: High  Note:   Priority: High  Timeframe:  Long-Range Goal Priority:  High Start Date:   02/14/22               Expected End Date:  ongoing. Goal reopened on 06/21/22 as patient prefers to speak directly to [facility] LCSW for mental health support, resources and coping skill education,   Follow Up Date---07/13/22  - check out counseling - keep 90 percent of counseling appointments - schedule counseling appointment    Why is this important?             Beating depression may take some time.            If you don't feel better right away, don't give up on your treatment plan.    Current barriers:   Chronic Mental Health needs related to anxiety, panic and stress management  Mental Health Concerns  and Social Isolation Needs Support, Education, and Care Coordination in order to meet unmet mental health needs.  Clinical Goal(s): verbalize understanding of plan for management of Anxiety, Panic and Stress     Types of Stress There are two types of stress: Emotional - types of emotional stress are relationship problems, pressure at work, financial worries, experiencing discrimination or having a major life change. Physical - Examples of physical stress include being sick having pain, not sleeping  well, recovery from an injury or having an alcohol and drug use disorder. Fight or Flight Sudden or ongoing stress activates your nervous system and floods your bloodstream with adrenaline and cortisol, two hormones that raise blood pressure, increase heart rate and spike blood sugar. These changes pitch your body into a fight or flight response. That enabled our ancestors to outrun saber-toothed tigers, and it's helpful today for situations like  dodging a car accident. But most modern chronic stressors, such as finances or a challenging relationship, keep your body in that heightened state, which hurts your health. Effects of Too Much Stress If constantly under stress, most of [place] will eventually start to function less well.  Multiple studies link chronic stress to a higher risk of heart disease, stroke, depression, weight gain, memory loss and even premature death, so it's important to recognize the warning signals. Talk to your doctor about ways to manage stress if you're experiencing any of these symptoms: Prolonged periods of poor sleep. Regular, severe headaches. Unexplained weight loss or gain. Feelings of isolation, withdrawal or worthlessness. Constant anger and irritability. Loss of interest in activities. Constant worrying or obsessive thinking. Excessive alcohol or drug use. Inability to concentrate.  10 Ways to Cope with Chronic Stress It's key to recognize stressful situations as they occur because it allows you to focus on managing how you react. We all need to know when to close our eyes and take a deep breath when we feel tension rising. Use these tips to prevent or reduce chronic stress. 1. Rebalance Work and Home All work and no play? If you're spending too much time at the office, intentionally put more dates in your calendar to enjoy time for fun, either alone or with others. 2. Get Regular Exercise Moving your body on a regular basis balances the nervous system and increases blood circulation, helping to flush out stress hormones. Even a daily 20-minute walk makes a difference. Any kind of exercise can lower stress and improve your mood ? just pick activities that you enjoy and make it a regular habit. 3. Eat Well and Limit Alcohol and Stimulants Alcohol, nicotine and caffeine may temporarily relieve stress but have negative health impacts and can make stress worse in the long run. Well-nourished bodies cope better,  so start with a good breakfast, add more organic fruits and vegetables for a well-balanced diet, avoid processed foods and sugar, try herbal tea and drink more water. 4. Connect with Supportive People Talking face to face with another person releases hormones that reduce stress. Lean on those good listeners in your life. 5. Carve Out Hobby Time Do you enjoy gardening, reading, listening to music or some other creative pursuit? Engage in activities that bring you pleasure and joy; research shows that reduces stress by almost half and lowers your heart rate, too. 6. Practice Meditation, Stress Reduction or Yoga Relaxation techniques activate a state of restfulness that counterbalances your body's fight-or-flight hormones. Even if this also means a 10-minute break in a long day: listen to music, read, go for a walk in nature, do a hobby, take a bath or spend time with a friend. Also consider taking a mindfulness-based stress reduction course to learn effective, lasting tools or try a daily deep breathing or imagery practice. Deep Breathing Slow, calm and deep breathing can help you relax. Try these steps to focus on your breathing and repeat as needed. Find a comfortable position and close your eyes. Exhale and drop your shoulders. Breathe in through  your nose; fill your lungs and then your belly. Think of relaxing your body, quieting your mind and becoming calm and peaceful. Breathe out slowly through your nose, relaxing your belly. Think of releasing tension, pain, worries or distress. Repeat steps three and four until you feel relaxed. Imagery This involves using your mind to excite the senses -- sound, vision, smell, taste and feeling. This may help ease your stress. Begin by getting comfortable and then do some slow breathing. Imagine a place you love being at. It could be somewhere from your childhood, somewhere you vacationed or just a place in your imagination. Feel how it is to be in the  place you're imagining. Pay attention to the sounds, air, colors, and who is there with you. This is a place where you feel cared for and loved. All is well. You are safe. Take in all the smells, sounds, tastes and feelings. As you do, feel your body being nourished and healed. Feel the calm that surrounds you. Breathe in all the good. Breathe out any discomfort or tension. 7. Sleep Enough If you get less than seven to eight hours of sleep, your body won't tolerate stress as well as it could. If stress keeps you up at night, address the cause and add extra meditation into your day to make up for the lost z's. Try to get seven to nine hours of sleep each night. Make a regular bedtime schedule. Keep your room dark and cool. Try to avoid computers, TV, cell phones and tablets before bed. 8. Bond with Connections You Enjoy Go out for a coffee with a friend, chat with a neighbor, call a family member, visit with a clergy member, or even hang out with your pet. Clinical studies show that spending even a short time with a companion animal can cut anxiety levels almost in half. 9. Take a Vacation Getting away from it all can reset your stress tolerance by increasing your mental and emotional outlook, which makes you a happier, more productive person upon return. Leave your cellphone and laptop at home! 10. See a Counselor, Coach or Therapist If negative thoughts overwhelm your ability to make positive changes, it's time to seek professional help. Make an appointment today--your health and life are worth it.  Patient Goals/Self-Care Activities: Over the next 120 days Attend scheduled medical appointments Utilize healthy coping skills and supportive resources discussed Contact PCP with any questions or concerns  Follow up goal  Dickie La, BSW, MSW, [employer] Managed Medicaid LCSW [facility]  [facility] [email or web address] Phone: [phone]

## 2022-06-30 ENCOUNTER — Ambulatory Visit: Payer: Medicaid Other | Admitting: Obstetrics & Gynecology

## 2022-07-08 ENCOUNTER — Ambulatory Visit: Payer: Medicaid Other

## 2022-07-08 ENCOUNTER — Other Ambulatory Visit: Payer: Self-pay

## 2022-07-08 NOTE — Patient Outreach (Signed)
Medicaid Managed Care Social Work Note  07/08/2022 Name:  Jasmine Gordon MRN:  [id number] DOB:  May 02, 1986  Jasmine Gordon is an 36 y.o. year old female who is a primary patient of [name], DO.  The Medicaid Managed Care Coordination team was consulted for assistance with:  Community Resources   Jasmine Gordon was given information about Medicaid Managed Care Coordination team services today. Jasmine Gordon Patient agreed to services and verbal consent obtained.  Engaged with patient  for by telephone forinitial visit in response to referral for case management and/or care coordination services.   Assessments/Interventions:  Review of past medical history, allergies, medications, health status, including review of consultants reports, laboratory and other test data, was performed as part of comprehensive evaluation and provision of chronic care management services.  SDOH: (Social Determinant of Health) assessments and interventions performed: BSW completed a telephone outreach with patient for financial resources. Patient stated that she is currently waiting for disability to make a decision and she has no income. Her husband receives 2040 disability a month and they have 3 children. They are behind on utilities. BSW sent resources for [employer] to patients email at [email or web address]  Advanced Directives Status:  Not addressed in this encounter.  Care Plan                 Allergies  Allergen Reactions   Reglan [Metoclopramide] Hives and Shortness Of Breath   Contrave [Naltrexone-Bupropion Hcl Er] Other (See Comments)    Had some reaction to the medication    Medications Reviewed Today     Reviewed by Gustavus Bryant, LCSW (Social Worker) on 06/29/22 at 1319  Med List Status: <None>   Medication Order Taking? Sig Documenting Provider Last Dose Status Informant  albuterol (VENTOLIN HFA) 108 (90 Base) MCG/ACT inhaler [id number] No Inhale  2 puffs into the lungs every 6 (six) hours as needed for wheezing or shortness of breath. [name], DO Taking Active   cetirizine (ZYRTEC) 10 MG tablet [id number] No Take 10 mg by mouth daily as needed for allergies. [provider] Taking Active   chlorhexidine (PERIDEX) 0.12 % solution [id number] No Use as directed 10 mLs in the mouth or throat. [provider] Taking Active   FLUoxetine (PROZAC) 10 MG capsule [id number] No Take by mouth. [provider] Taking Active   FLUoxetine (PROZAC) 20 MG capsule [id number] No Take 1 capsule (20 mg total) by mouth daily.  Patient not taking: Reported on 05/12/2022   Neysa Hotter, MD Not Taking Expired 06/12/22 2359   fluticasone (FLONASE) 50 MCG/ACT nasal spray [id number] No Place 1 spray into both nostrils daily. Use for 4-6 weeks then stop and use seasonally or as needed. Lorre Munroe, NP Taking Active   hydrOXYzine (ATARAX) 10 MG tablet [id number] No Take 1 tablet (10 mg total) by mouth 3 (three) times daily as needed for anxiety. [name], DO Taking Active   hydrOXYzine (ATARAX) 25 MG tablet [id number] No Take by mouth. [provider] Taking Active   metoprolol tartrate (LOPRESSOR) 25 MG tablet [id number] No Take 0.5 tablets (12.5 mg total) by mouth 2 (two) times daily.  Patient not taking: Reported on 06/14/2022   Furth, Cadence H, PA-C Not Taking Active   montelukast (SINGULAIR) 10 MG tablet [id number] No Take 10 mg by mouth at bedtime as needed. [provider] Taking Active   Multiple Vitamins-Minerals (ONE-A-DAY WOMENS PO) [id number] No Take 1 tablet by mouth daily.  [provider] Taking Active   OVER THE COUNTER MEDICATION [id number] No Goli ashwaganda gummy 1/2 daily [provider] Taking Active   pantoprazole (PROTONIX) 40 MG tablet [id number] No Take 1 tablet (40 mg total) by mouth daily. Fransico Michael, Cadence H, PA-C Taking Active   Spacer/Aero-Holding Chambers  (AEROCHAMBER MV) inhaler [id number] No Use as instructed [name], DO Taking Active   Vitamin D, Ergocalciferol, (DRISDOL) 1.25 MG (50000 UNIT) CAPS capsule [id number] No Take 1 capsule (50,000 Units total) by mouth every 7 (seven) days. [name], DO Taking Active             Patient Active Problem List   Diagnosis Date Noted   Vitamin B12 deficiency 06/09/2022   Palpitations 01/21/2022   Shortness of breath 01/21/2022   Chest pain 01/21/2022   Generalized anxiety disorder with panic attacks 09/27/2021   Environmental and seasonal allergies 11/04/2020   Genital warts 09/11/2018   Positive TB test 09/25/2017   Allergic contact dermatitis due to metals 05/17/2017   Pre-diabetes 03/06/2017   Hyperlipidemia 03/06/2017   Allergic reaction 02/08/2017   Urticaria due to food allergy 02/08/2017   Vitamin D deficiency 11/08/2016   Iron deficiency anemia due to chronic blood loss 10/03/2016   Menorrhagia with regular cycle 06/27/2016   Mild persistent asthma without complication 06/17/2016   Anxiety and depression 06/17/2016   Morbid obesity (HCC) 06/17/2016   PTSD (post-traumatic stress disorder) 2016    Conditions to be addressed/monitored per PCP order:   community resources  There are no care plans that you recently modified to display for this patient.   Follow up:  Patient agrees to Care Plan and Follow-up.  Plan: The Managed Medicaid care management team will reach out to the patient again over the next 30 days.  Date/time of next scheduled Social Work care management/care coordination outreach:  08/08/22  [name], [name], [facility]  [facility]  High Risk Managed Medicaid Team  [phone]

## 2022-07-08 NOTE — Patient Instructions (Signed)
Visit Information  [name] was given information about Medicaid Managed Care team care coordination services as a part of their Healthy [facility] Medicaid benefit. Jasmine Gordon verbally consented to engagement with the [facility] Managed Care team.   If you are experiencing a medical emergency, please call 911 or report to your local emergency department or urgent care.   If you have a non-emergency medical problem during routine business hours, please contact your provider's office and ask to speak with a nurse.   For questions related to your Healthy [facility] health plan, please call: [phone] or visit the homepage here: [email or web address]  If you would like to schedule transportation through your Healthy [facility] plan, please call the following number at least 2 days in advance of your appointment: [phone]  For information about your ride after you set it up, call Ride Assist at [phone]. Use this number to activate a Will Call pickup, or if your transportation is late for a scheduled pickup. Use this number, too, if you need to make a change or cancel a previously scheduled reservation.  If you need transportation services right away, call [phone]. The after-hours call center is staffed 24 hours to handle ride assistance and urgent reservation requests (including discharges) 365 days a year. Urgent trips include sick visits, hospital discharge requests and life-sustaining treatment.  Call the [facility] Line at [phone], at any time, 24 hours a day, 7 days a week. If you are in danger or need immediate medical attention call 911.  If you would like help to quit smoking, call 1-800-QUIT-NOW (949-382-2925) OR Espaol: 1-855-Djelo-Ya (7-989-211-9417) o para ms informacin haga clic aqu or Text READY to 408-144 to register via text  [name] following are the goals we discussed in your visit  today:   Goals Addressed   None      Social Worker will follow up in 30 days .   [name], BSW, [place] [facility]  [place]  High Risk Managed Medicaid Team  [phone]   Following is a copy of your plan of care:  There are no care plans that you recently modified to display for this patient.
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
## 2022-07-12 ENCOUNTER — Encounter: Payer: Self-pay | Admitting: Psychiatry

## 2022-07-12 ENCOUNTER — Ambulatory Visit (INDEPENDENT_AMBULATORY_CARE_PROVIDER_SITE_OTHER): Payer: Medicaid Other | Admitting: Psychiatry

## 2022-07-12 VITALS — BP 136/84 | HR 84 | Temp 98.3°F | Wt 247.0 lb

## 2022-07-12 DIAGNOSIS — F411 Generalized anxiety disorder: Secondary | ICD-10-CM

## 2022-07-12 DIAGNOSIS — F331 Major depressive disorder, recurrent, moderate: Secondary | ICD-10-CM

## 2022-07-12 DIAGNOSIS — F410 Panic disorder [episodic paroxysmal anxiety] without agoraphobia: Secondary | ICD-10-CM

## 2022-07-12 DIAGNOSIS — F4310 Post-traumatic stress disorder, unspecified: Secondary | ICD-10-CM | POA: Diagnosis not present

## 2022-07-12 MED ORDER — ESCITALOPRAM OXALATE 10 MG PO TABS: ORAL_TABLET | ORAL | 1 refills | Status: DC

## 2022-07-12 NOTE — Patient Instructions (Signed)
Start lexapro 5 mg daily for one week  Discontinue fluoxetine  Continue hydroxyzine 25 mg daily as needed for anxiety  Next appointment: 10/16 at 4 PM

## 2022-07-13 ENCOUNTER — Other Ambulatory Visit: Payer: Self-pay | Admitting: Licensed Clinical Social Worker

## 2022-07-13 NOTE — Patient Instructions (Addendum)
Visit Information  Ms. Delynda Sepulveda was given information about Medicaid Managed Care team care coordination services as a part of their Healthy [facility] Medicaid benefit. Lenay Avaiyah Strubel verbally consented to engagement with the [facility] Managed Care team.   If you are experiencing a medical emergency, please call 911 or report to your local emergency department or urgent care.   If you have a non-emergency medical problem during routine business hours, please contact your provider's office and ask to speak with a nurse.   For questions related to your Healthy [facility] health plan, please call: [phone] or visit the homepage here: [email or web address]  If you would like to schedule transportation through your Healthy [facility] plan, please call the following number at least 2 days in advance of your appointment: [phone]  For information about your ride after you set it up, call Ride Assist at [phone]. Use this number to activate a Will Call pickup, or if your transportation is late for a scheduled pickup. Use this number, too, if you need to make a change or cancel a previously scheduled reservation.  If you need transportation services right away, call [phone]. The after-hours call center is staffed 24 hours to handle ride assistance and urgent reservation requests (including discharges) 365 days a year. Urgent trips include sick visits, hospital discharge requests and life-sustaining treatment.  Call the [facility] Line at [phone], at any time, 24 hours a day, 7 days a week. If you are in danger or need immediate medical attention call 911.  If you would like help to quit smoking, call 1-800-QUIT-NOW (2074012986) OR Espaol: 1-855-Djelo-Ya (4-742-595-6387) o para ms informacin haga clic aqu or Text READY to 564-332 to register via text  Following is a copy of your plan of care:  Care Plan : LCSW Plan of  Care  Updates made by Gustavus Bryant, LCSW since 07/13/2022 12:00 AM     Problem: Anxiety Identification (Anxiety)      Long-Range Goal: To find a long term counselor in order to learn how to alleviate my anxiety and stress   Start Date: 02/14/2022  Priority: High  Note:   Priority: High  Timeframe:  Long-Range Goal Priority:  High Start Date:   02/14/22               Expected End Date:  ongoing. Goal reopened on 06/21/22 as patient prefers to speak directly to [facility] LCSW for mental health support, resources and coping skill education,   Follow Up Date---08/17/22 at 1 pm.  - check out counseling - keep 90 percent of counseling appointments - schedule counseling appointment    Why is this important?             Beating depression may take some time.            If you don't feel better right away, don't give up on your treatment plan.    Current barriers:   Chronic Mental Health needs related to anxiety, panic and stress management  Mental Health Concerns  and Social Isolation Needs Support, Education, and Care Coordination in order to meet unmet mental health needs.  Clinical Goal(s): verbalize understanding of plan for management of Anxiety, Panic and Stress    Types of Stress There are two types of stress: Emotional - types of emotional stress are relationship problems, pressure at work, financial worries, experiencing discrimination or having a major life change. Physical - Examples of physical stress include being sick having pain, not  sleeping well, recovery from an injury or having an alcohol and drug use disorder. Fight or Flight Sudden or ongoing stress activates your nervous system and floods your bloodstream with adrenaline and cortisol, two hormones that raise blood pressure, increase heart rate and spike blood sugar. These changes pitch your body into a fight or flight response. That enabled our ancestors to outrun saber-toothed tigers, and it's helpful today for situations  like dodging a car accident. But most modern chronic stressors, such as finances or a challenging relationship, keep your body in that heightened state, which hurts your health. Effects of Too Much Stress If constantly under stress, most of [place] will eventually start to function less well.  Multiple studies link chronic stress to a higher risk of heart disease, stroke, depression, weight gain, memory loss and even premature death, so it's important to recognize the warning signals. Talk to your doctor about ways to manage stress if you're experiencing any of these symptoms: Prolonged periods of poor sleep. Regular, severe headaches. Unexplained weight loss or gain. Feelings of isolation, withdrawal or worthlessness. Constant anger and irritability. Loss of interest in activities. Constant worrying or obsessive thinking. Excessive alcohol or drug use. Inability to concentrate.  10 Ways to Cope with Chronic Stress It's key to recognize stressful situations as they occur because it allows you to focus on managing how you react. We all need to know when to close our eyes and take a deep breath when we feel tension rising. Use these tips to prevent or reduce chronic stress. 1. Rebalance Work and Home All work and no play? If you're spending too much time at the office, intentionally put more dates in your calendar to enjoy time for fun, either alone or with others. 2. Get Regular Exercise Moving your body on a regular basis balances the nervous system and increases blood circulation, helping to flush out stress hormones. Even a daily 20-minute walk makes a difference. Any kind of exercise can lower stress and improve your mood ? just pick activities that you enjoy and make it a regular habit. 3. Eat Well and Limit Alcohol and Stimulants Alcohol, nicotine and caffeine may temporarily relieve stress but have negative health impacts and can make stress worse in the long run. Well-nourished bodies cope  better, so start with a good breakfast, add more organic fruits and vegetables for a well-balanced diet, avoid processed foods and sugar, try herbal tea and drink more water. 4. Connect with Supportive People Talking face to face with another person releases hormones that reduce stress. Lean on those good listeners in your life. 5. Carve Out Hobby Time Do you enjoy gardening, reading, listening to music or some other creative pursuit? Engage in activities that bring you pleasure and joy; research shows that reduces stress by almost half and lowers your heart rate, too. 6. Practice Meditation, Stress Reduction or Yoga Relaxation techniques activate a state of restfulness that counterbalances your body's fight-or-flight hormones. Even if this also means a 10-minute break in a long day: listen to music, read, go for a walk in nature, do a hobby, take a bath or spend time with a friend. Also consider taking a mindfulness-based stress reduction course to learn effective, lasting tools or try a daily deep breathing or imagery practice. Deep Breathing Slow, calm and deep breathing can help you relax. Try these steps to focus on your breathing and repeat as needed. Find a comfortable position and close your eyes. Exhale and drop your shoulders. Breathe in  through your nose; fill your lungs and then your belly. Think of relaxing your body, quieting your mind and becoming calm and peaceful. Breathe out slowly through your nose, relaxing your belly. Think of releasing tension, pain, worries or distress. Repeat steps three and four until you feel relaxed. Imagery This involves using your mind to excite the senses -- sound, vision, smell, taste and feeling. This may help ease your stress. Begin by getting comfortable and then do some slow breathing. Imagine a place you love being at. It could be somewhere from your childhood, somewhere you vacationed or just a place in your imagination. Feel how it is to be in  the place you're imagining. Pay attention to the sounds, air, colors, and who is there with you. This is a place where you feel cared for and loved. All is well. You are safe. Take in all the smells, sounds, tastes and feelings. As you do, feel your body being nourished and healed. Feel the calm that surrounds you. Breathe in all the good. Breathe out any discomfort or tension. 7. Sleep Enough If you get less than seven to eight hours of sleep, your body won't tolerate stress as well as it could. If stress keeps you up at night, address the cause and add extra meditation into your day to make up for the lost z's. Try to get seven to nine hours of sleep each night. Make a regular bedtime schedule. Keep your room dark and cool. Try to avoid computers, TV, cell phones and tablets before bed. 8. Bond with Connections You Enjoy Go out for a coffee with a friend, chat with a neighbor, call a family member, visit with a clergy member, or even hang out with your pet. Clinical studies show that spending even a short time with a companion animal can cut anxiety levels almost in half. 9. Take a Vacation Getting away from it all can reset your stress tolerance by increasing your mental and emotional outlook, which makes you a happier, more productive person upon return. Leave your cellphone and laptop at home! 10. See a Counselor, Coach or Therapist If negative thoughts overwhelm your ability to make positive changes, it's time to seek professional help. Make an appointment today--your health and life are worth it.  Patient Goals/Self-Care Activities: Over the next 120 days Attend scheduled medical appointments Utilize healthy coping skills and supportive resources discussed Contact PCP with any questions or concerns  Follow up goal  Eula Fried, BSW, MSW, [employer] Managed Medicaid LCSW [email or web address] Phone: [phone]

## 2022-07-13 NOTE — Patient Outreach (Signed)
Medicaid Managed Care Social Work Note  07/13/2022 Name:  Jasmine Gordon MRN:  [id number] DOB:  Sep 05, 1986  Jasmine Gordon is an 35 y.o. year old female who is a primary patient of [name], DO.  The Medicaid Managed Care Coordination team was consulted for assistance with:  [place] and Resources  [name] was given information about Medicaid Managed Care Coordination team services today. [place] Patient agreed to services and verbal consent obtained.  Engaged with patient  for by telephone forfollow up visit in response to referral for case management and/or care coordination services.   Assessments/Interventions:  Review of past medical history, allergies, medications, health status, including review of consultants reports, laboratory and other test data, was performed as part of comprehensive evaluation and provision of chronic care management services.  SDOH: (Social Determinant of Health) assessments and interventions performed: SDOH Interventions    Flowsheet Row Most Recent Value  SDOH Interventions   Financial Strain Interventions Intervention Not Indicated  Social Connections Interventions Intervention Not Indicated       Advanced Directives Status:  See Care Plan for related entries.  Care Plan                 Allergies  Allergen Reactions   Reglan [Metoclopramide] Hives and Shortness Of Breath   Contrave [Naltrexone-Bupropion Hcl Er] Other (See Comments)    Had some reaction to the medication    Medications Reviewed Today     Reviewed by Greg Cutter, LCSW (Social Worker) on 07/13/22 at [place] List Status: <None>   Medication Order Taking? Sig Documenting Provider Last Dose Status Informant  albuterol (VENTOLIN HFA) 108 (90 Base) MCG/ACT inhaler [id number] No Inhale 2 puffs into the lungs every 6 (six) hours as needed for wheezing or shortness of breath. [name], DO Taking Active    cetirizine (ZYRTEC) 10 MG tablet [id number] No Take 10 mg by mouth daily as needed for allergies. [provider] Taking Active   chlorhexidine (PERIDEX) 0.12 % solution [id number] No Use as directed 10 mLs in the mouth or throat. [provider] Taking Active   escitalopram (LEXAPRO) 10 MG tablet [id number]  5 mg daily for one week, then 10 mg daily Hisada, Elie Goody, MD  Active   fluticasone (FLONASE) 50 MCG/ACT nasal spray [id number] No Place 1 spray into both nostrils daily. Use for 4-6 weeks then stop and use seasonally or as needed. Jearld Fenton, NP Taking Active   hydrOXYzine (ATARAX) 10 MG tablet [id number] No Take 1 tablet (10 mg total) by mouth 3 (three) times daily as needed for anxiety. [name], DO Taking Active   metoprolol tartrate (LOPRESSOR) 25 MG tablet [id number] No Take 0.5 tablets (12.5 mg total) by mouth 2 (two) times daily. Furth, Cadence H, PA-C Taking Active   montelukast (SINGULAIR) 10 MG tablet [id number] No Take 10 mg by mouth at bedtime as needed. [provider] Taking Active   Multiple Vitamins-Minerals (ONE-A-DAY WOMENS PO) [id number] No Take 1 tablet by mouth daily. [provider] Taking Active   OVER THE COUNTER MEDICATION [id number] No Goli ashwaganda gummy 1/2 daily [provider] Taking Active   pantoprazole (PROTONIX) 40 MG tablet [id number] No Take 1 tablet (40 mg total) by mouth daily. Kathlen Mody, Cadence H, PA-C Taking Active   Spacer/Aero-Holding Chambers (AEROCHAMBER MV) inhaler [id number] No Use as instructed [name], DO Taking Active   Vitamin D, Ergocalciferol, (DRISDOL) 1.25 MG (50000  UNIT) CAPS capsule [id number] No Take 1 capsule (50,000 Units total) by mouth every 7 (seven) days. [name], DO Taking Active             Patient Active Problem List   Diagnosis Date Noted   Vitamin B12 deficiency 06/09/2022   Palpitations 01/21/2022   Shortness of breath 01/21/2022    Chest pain 01/21/2022   Generalized anxiety disorder with panic attacks 09/27/2021   Environmental and seasonal allergies 11/04/2020   Genital warts 09/11/2018   Positive TB test 09/25/2017   Allergic contact dermatitis due to metals 05/17/2017   Pre-diabetes 03/06/2017   Hyperlipidemia 03/06/2017   Allergic reaction 02/08/2017   Urticaria due to food allergy 02/08/2017   Vitamin D deficiency 11/08/2016   Iron deficiency anemia due to chronic blood loss 10/03/2016   Menorrhagia with regular cycle 06/27/2016   Mild persistent asthma without complication 0000000   Anxiety and depression 06/17/2016   Morbid obesity ([place]) 06/17/2016   PTSD (post-traumatic stress disorder) 2016    Conditions to be addressed/monitored per PCP order:  Anxiety and Depression  Care Plan : LCSW Plan of Care  Updates made by Greg Cutter, LCSW since 07/13/2022 12:00 AM     Problem: Anxiety Identification (Anxiety)      Long-Range Goal: To find a long term counselor in order to learn how to alleviate my anxiety and stress   Start Date: 02/14/2022  Priority: High  Note:   Priority: High  Timeframe:  Long-Range Goal Priority:  High Start Date:   02/14/22               Expected End Date:  ongoing. Goal reopened on 06/21/22 as patient prefers to speak directly to [facility] LCSW for mental health support, resources and coping skill education,   Follow Up Date---08/17/22 at 1 pm.  - check out counseling - keep 90 percent of counseling appointments - schedule counseling appointment    Why is this important?             Beating depression may take some time.            If you don't feel better right away, don't give up on your treatment plan.    Current barriers:   Chronic Mental Health needs related to anxiety, panic and stress management  Mental Health Concerns  and Social Isolation Needs Support, Education, and Care Coordination in order to meet unmet mental health needs.  Clinical Goal(s): verbalize  understanding of plan for management of Anxiety, Panic and Stress     Clinical Interventions:  Assessed patient's previous and current treatment, coping skills, support system and barriers to care. Patient and spouse provided hx  Verbalization of feelings encouraged, motivational interviewing employed Emotional support provided, positive coping strategies explored Self care/establishing healthy boundaries emphasized Patient reports that her anxiety has increased which has affected her ability to carry out work and function daily.  Patient receives strong support from spouse Patient is agreeable to referral to [facility] for counseling. Patient has already started psychiatry treatment there. [facility] LCSW made referral on 02/14/22. Update 03/14/22- Patient continues to receive psychiatry from [facility] but they do not have any available therapist for counseling. Patient is in need of counseling as her stress has increased since she had to start back work last week. She reports that her disability is still processing and she is very frustrated with this process. [facility] LCSW sent patient a secure email with [facility] contact information. She is  agreeable to contact them to schedule an intake appointment for counseling. Patient did not want [facility]. She reports that she cannot talk long because she is back at work today and experiencing extreme stress. Emotional support provided to patient. UPDATE 05/18/22- Patient completed her first counseling session at [facility] where she receives psychiatry services at. Patient reports that her mental health has improved drastically and her counselor suggested a follow up counseling appointment in August. Her counseling session was on 05/16/22. She has a psychiatry appointment tomorrow. She reports being satisfied with her current mental health support network team. She ask that [facility] LCSW email her with contact information in case any mental health concerns arise in the future. Patient is agreeable  to social work case closure. [place] LCSW will update [facility] RNCM. UPDATE- 06/29/22, patient is requesting community resource assistance. Referral placed to [facility] BSW and appointment was scheduled for next week. Patient is experiencing ongoing financial stressors and is at risk of losing her source of transportation which would affect her ability to keep appointments. [facility] LCSW questioned if patient was able to find a different psychiatrist at [facility] as she was unable to build a strong rapport with her last one. Patient will contact [place] today to reschedule psychiatry appointment that she missed and to question about this. [facility] LCSW will follow up in 14 days to make sure patient was able to be established with a psychiatrist. Patient successfully completed therapy session with her [facility] counselor on 06/27/22. Patient recently started back work which has negatively affected her mental health (3 days a week at [employer]). Patient reports that she experiences both anxiety and panic attacks. She shares that she felt alone most days until she joined an anxiety management support group on Facebook. She was receptive to anxiety and depression management coping skill education. LCSW provided education on relaxation techniques such as meditation, deep breathing, massage, grounding exercsies or yoga that can activate the body's relaxation response and ease symptoms of stress and anxiety. LCSW ask that when pt is struggling with difficult emotions and racing thoughts that they start this relaxation response process. LCSW provided extensive education on healthy coping skills for anxiety. SW used active and reflective listening, validated patient's feelings/concerns, and provided emotional support.  Motivational Interviewing employed Depression screen reviewed  PHQ2/ PHQ9 completed Mindfulness or Relaxation training provided Active listening / Reflection utilized  Emotional Support Provided Problem [place] strategies  reviewed Provided psychoeducation for mental health needs  Provided brief CBT  Reviewed mental health medications and discussed importance of compliance:  Quality of sleep assessed & Sleep Hygiene techniques promoted  Participation in counseling encouraged  Verbalization of feelings encouraged  Suicidal Ideation/Homicidal Ideation assessed: Patient denies SI/HI  Review resources, discussed options and provided patient information about  [place] care team collaboration (see longitudinal plan of care) UPDATE 04/13/22- Patient has completed counseling session at [facility] and has upcoming appointment on 05/16/22. However, patient was a no show for her psychiatry appointment and did not reschedule this. [facility] LCSW completed joint call with patient to [facility] and was able to schedule her psychiatry appointment for today at 4:00 pm. This appointment will be virtual. [facility] LCSW emailed patient a list of coping skills for anxiety, depression and stress which include the following: When your car dies or a deadline looms, how do you respond? Long-term, low-grade or acute stress takes a serious toll on your body and mind, so don't ignore feelings of constant tension. Stress is a natural part  of life. However, too much stress can harm our health, especially if it continues every day. This is chronic stress and can put you at risk for heart problems like heart disease and depression. Understand what's happening inside your body and learn simple coping skills to combat the negative impacts of everyday stressors. UPDATE 06/21/22- Patient reports that she does NOT wish to continue psychiatry services at [facility] because she did not form good rapport with psychiatrist there. She reports that she prefers for Dr. Kirtland Bouchard (her pcp) to manage her mental health medications. UPDATE 07/13/22- Patient is stressed about her current housing situation as she has to find somewhere else to reside once her lease ends this year.  Emotional support provided. Patient reports that her recent counseling and psychiatry appointment went very well. She reports that her psychiatrist appointment went very well yesterday. This visit was in person which patient prefers. Patient reports that's her psychiatrist prescribed her Tyler Aas which has effectively worked for her in the past. [facility] LCSW will follow up next month to provide education on self-care, stress management and coping skills.  Types of Stress There are two types of stress: Emotional - types of emotional stress are relationship problems, pressure at work, financial worries, experiencing discrimination or having a major life change. Physical - Examples of physical stress include being sick having pain, not sleeping well, recovery from an injury or having an alcohol and drug use disorder. Fight or Flight Sudden or ongoing stress activates your nervous system and floods your bloodstream with adrenaline and cortisol, two hormones that raise blood pressure, increase heart rate and spike blood sugar. These changes pitch your body into a fight or flight response. That enabled our ancestors to outrun saber-toothed tigers, and it's helpful today for situations like dodging a car accident. But most modern chronic stressors, such as finances or a challenging relationship, keep your body in that heightened state, which hurts your health. Effects of Too Much Stress If constantly under stress, most of [place] will eventually start to function less well.  Multiple studies link chronic stress to a higher risk of heart disease, stroke, depression, weight gain, memory loss and even premature death, so it's important to recognize the warning signals. Talk to your doctor about ways to manage stress if you're experiencing any of these symptoms: Prolonged periods of poor sleep. Regular, severe headaches. Unexplained weight loss or gain. Feelings of isolation, withdrawal or worthlessness. Constant anger and  irritability. Loss of interest in activities. Constant worrying or obsessive thinking. Excessive alcohol or drug use. Inability to concentrate.  10 Ways to Cope with Chronic Stress It's key to recognize stressful situations as they occur because it allows you to focus on managing how you react. We all need to know when to close our eyes and take a deep breath when we feel tension rising. Use these tips to prevent or reduce chronic stress. 1. Rebalance Work and Home All work and no play? If you're spending too much time at the office, intentionally put more dates in your calendar to enjoy time for fun, either alone or with others. 2. Get Regular Exercise Moving your body on a regular basis balances the nervous system and increases blood circulation, helping to flush out stress hormones. Even a daily 20-minute walk makes a difference. Any kind of exercise can lower stress and improve your mood ? just pick activities that you enjoy and make it a regular habit. 3. Eat Well and Limit Alcohol and Stimulants Alcohol, nicotine and caffeine  may temporarily relieve stress but have negative health impacts and can make stress worse in the long run. Well-nourished bodies cope better, so start with a good breakfast, add more organic fruits and vegetables for a well-balanced diet, avoid processed foods and sugar, try herbal tea and drink more water. 4. Connect with Supportive People Talking face to face with another person releases hormones that reduce stress. Lean on those good listeners in your life. 5. [place] Time Do you enjoy gardening, reading, listening to music or some other creative pursuit? Engage in activities that bring you pleasure and joy; research shows that reduces stress by almost half and lowers your heart rate, too. 6. Practice Meditation, Stress Reduction or Yoga Relaxation techniques activate a state of restfulness that counterbalances your body's fight-or-flight hormones. Even if  this also means a 10-minute break in a long day: listen to music, read, go for a walk in nature, do a hobby, take a bath or spend time with a friend. Also consider taking a mindfulness-based stress reduction course to learn effective, lasting tools or try a daily deep breathing or imagery practice. Deep Breathing Slow, calm and deep breathing can help you relax. Try these steps to focus on your breathing and repeat as needed. Find a comfortable position and close your eyes. Exhale and drop your shoulders. Breathe in through your nose; fill your lungs and then your belly. Think of relaxing your body, quieting your mind and becoming calm and peaceful. Breathe out slowly through your nose, relaxing your belly. Think of releasing tension, pain, worries or distress. Repeat steps three and four until you feel relaxed. Imagery This involves using your mind to excite the senses -- sound, vision, smell, taste and feeling. This may help ease your stress. Begin by getting comfortable and then do some slow breathing. Imagine a place you love being at. It could be somewhere from your childhood, somewhere you vacationed or just a place in your imagination. Feel how it is to be in the place you're imagining. Pay attention to the sounds, air, colors, and who is there with you. This is a place where you feel cared for and loved. All is well. You are safe. Take in all the smells, sounds, tastes and feelings. As you do, feel your body being nourished and healed. Feel the calm that surrounds you. Breathe in all the good. Breathe out any discomfort or tension. 7. Sleep Enough If you get less than seven to eight hours of sleep, your body won't tolerate stress as well as it could. If stress keeps you up at night, address the cause and add extra meditation into your day to make up for the lost z's. Try to get seven to nine hours of sleep each night. Make a regular bedtime schedule. Keep your room dark and cool. Try to  avoid computers, TV, cell phones and tablets before bed. 8. Bond with Connections You Enjoy Go out for a coffee with a friend, chat with a neighbor, call a family member, visit with a clergy member, or even hang out with your pet. Clinical studies show that spending even a short time with a companion animal can cut anxiety levels almost in half. 9. Take a Vacation Getting away from it all can reset your stress tolerance by increasing your mental and emotional outlook, which makes you a happier, more productive person upon return. Leave your cellphone and laptop at home! 10. See a Counselor, Coach or Therapist If negative thoughts overwhelm your  ability to make positive changes, it's time to seek professional help. Make an appointment today--your health and life are worth it.  Patient Goals/Self-Care Activities: Over the next 120 days Attend scheduled medical appointments Utilize healthy coping skills and supportive resources discussed Contact PCP with any questions or concerns  Follow up goal     Follow up:  Patient agrees to Care Plan and Follow-up.  Plan: The Managed Medicaid care management team will reach out to the patient again over the next 45 days.  Date/time of next scheduled Social Work care management/care coordination outreach:  08/17/22 at 1:00 pm.  Dickie La, BSW, MSW, LCSW Managed Medicaid LCSW [facility]  [facility] [email or web address] Phone: [phone]

## 2022-07-27 ENCOUNTER — Other Ambulatory Visit: Payer: Self-pay | Admitting: Obstetrics and Gynecology

## 2022-07-27 NOTE — Patient Instructions (Signed)
Hi [name], thanks for speaking with me today, I appreciate your time.  [name] was given information about Medicaid Managed Care team care coordination services as a part of their Healthy [facility] benefit. Jasmine Gordon verbally consented to engagement with the [facility] Managed Care team.   If you are experiencing a medical emergency, please call 911 or report to your local emergency department or urgent care.   If you have a non-emergency medical problem during routine business hours, please contact your provider's office and ask to speak with a nurse.   For questions related to your Healthy [facility] health plan, please call: [phone] or visit the homepage here: [email or web address]  If you would like to schedule transportation through your Healthy [facility] plan, please call the following number at least 2 days in advance of your appointment: [phone]  For information about your ride after you set it up, call Ride Assist at [phone]. Use this number to activate a Will Call pickup, or if your transportation is late for a scheduled pickup. Use this number, too, if you need to make a change or cancel a previously scheduled reservation.  If you need transportation services right away, call [phone]. The after-hours call center is staffed 24 hours to handle ride assistance and urgent reservation requests (including discharges) 365 days a year. Urgent trips include sick visits, hospital discharge requests and life-sustaining treatment.  Call the [facility] Line at [phone], at any time, 24 hours a day, 7 days a week. If you are in danger or need immediate medical attention call 911.  If you would like help to quit smoking, call 1-800-QUIT-NOW (410-316-9499) OR Espaol: 1-855-Djelo-Ya (0-973-532-9924) o para ms informacin haga clic aqu or Text READY to 268-341 to register via text  Ms.  Jasmine Gordon - following are the goals we discussed in your visit today:   Goals Addressed    imeframe:  Long-Range Goal Priority:  High Start Date:   03/07/22                          Expected End Date:     ongoing                  Follow Up Date 08/31/22   - schedule appointment for flu shot - schedule appointment for vaccines needed due to my age or health - schedule recommended health tests (blood work, mammogram, colonoscopy, pap test) - schedule and keep appointment for annual check-up    Why is this important?   Screening tests can find diseases early when they are easier to treat.  Your doctor or nurse will talk with you about which tests are important for you.  Getting shots for common diseases like the flu and shingles will help prevent them.  07/27/22:  EEG WNL 07/02/22, seeing Psychiatrist   Patient verbalizes understanding of instructions and care plan provided today and agrees to view in MyChart. Active MyChart status and patient understanding of how to access instructions and care plan via MyChart confirmed with patient.     The Managed Medicaid care management team will reach out to the patient again over the next 30 business  days.  The  Patient  has been provided with contact information for the Managed Medicaid care management team and has been advised to call with any health related questions or concerns.   [name] RN, BSN [place]  [facility] [employer]  Management Coordinator - Managed Medicaid High Risk [phone]   Following is a copy of your plan of care:  Care Plan : RN Care Manager Plan of Care  Updates made by [name], RN since 07/27/2022 12:00 AM     Problem: Health Promotion or Disease Self-Management (General Plan of Care)      Long-Range Goal: Chronic Disease Management and Care Coordination Needs   Expected End Date: 10/26/2022  Priority: High  Note:   Current Barriers:  Knowledge Deficits related to plan of care for  management of Asthma, anxiety  Chronic Disease Management support and education needs related to Asthma, anxiety 07/27/22:  Patient states she moved to an apartment yesterday with family as landlord did not renew lease, 2 family members recently died and brother in ICU.  Patient still seeing Psychiatrist and last saw 8/22.  Disability denied.  Patient states she is doing okay right now, support provided.  RNCM Clinical Goal(s):  Patient will verbalize understanding of plan for management of Asthma, anxiety as evidenced by patient report verbalize basic understanding of  Asthma disease process and self health management plan as evidenced by patient report take all medications exactly as prescribed and will call provider for medication related questions as evidenced by patient report demonstrate understanding of rationale for each prescribed medication as evidenced by patient report attend all scheduled medical appointments: as evidenced by patient report continue to work with RN Care Manager to address care management and care coordination needs related to  Asthma as evidenced by adherence to CM Team Scheduled appointments work with social worker to address  related to the management of Mental Health Concerns  related to the management of Anxiety, Depression, and PTSD, panic attacks as evidenced by review of EMR and patient or social worker report experience decrease in ED visits as evidenced by EMR review.  ED visits in in last 6 months = 13 through collaboration with RN Care manager, provider, and care team-ED visit 7/18 for fatigue, insomnia  Interventions: Inter-disciplinary care team collaboration (see longitudinal plan of care) Evaluation of current treatment plan related to  self management and patient's adherence to plan as established by provider  Asthma: (Status:New goal.) Long Term Goal Advised patient to track and manage Asthma triggers Advised patient to self assesses Asthma action plan  zone and make appointment with provider if in the yellow zone for 48 hours without improvement Discussed the importance of adequate rest and management of fatigue with Asthma Assessed social determinant of health barriers  Collaborated with SW SW referral for anxiety/depression/panic attacks-completed  Patient Goals/Self-Care Activities: Take all medications as prescribed Attend all scheduled provider appointments Call pharmacy for medication refills 3-7 days in advance of running out of medications Perform all self care activities independently  Perform IADL's (shopping, preparing meals, housekeeping, managing finances) independently Call provider office for new concerns or questions  Work with the social worker to address care coordination needs and will continue to work with the clinical team to address health care and disease management related needs Patient to contact Pharmacy and/or Provider regarding medication  Follow Up Plan:  The patient has been provided with contact information for the care management team and has been advised to call with any health related questions or concerns.  The care management team will reach out to the patient again over the next 30 business  days.

## 2022-07-27 NOTE — Patient Outreach (Signed)
Medicaid Managed Care   Nurse Care Manager Note  07/27/2022 Name:  Jasmine Gordon MRN:  [id number] DOB:  01/15/1986  Jasmine Gordon is an 36 y.o. year old female who is a primary patient of [name], DO.  The [facility] Managed Care Coordination team was consulted for assistance with:    Chronic healthcare management needs, asthma, anxiety, depression, HLD, PTSD, panic attacks  Jasmine Gordon was given information about Medicaid Managed Care Coordination team services today. Jasmine Gordon Patient agreed to services and verbal consent obtained.  Engaged with patient by telephone for follow up visit in response to provider referral for case management and/or care coordination services.   Assessments/Interventions:  Review of past medical history, allergies, medications, health status, including review of consultants reports, laboratory and other test data, was performed as part of comprehensive evaluation and provision of chronic care management services.  SDOH (Social Determinants of Health) assessments and interventions performed: SDOH Interventions    Flowsheet Row Patient Outreach Telephone from 07/27/2022 in [facility] Care Coordination Patient Outreach Telephone from 07/13/2022 in [facility] [employer] Office Visit from 07/12/2022 in [facility] Patient Outreach Telephone from 06/29/2022 in [facility] Care Coordination Patient Outreach Telephone from 06/20/2022 in [facility] [employer] Care Coordination Patient Outreach Telephone from 05/18/2022 in [facility] [employer] Care Coordination  SDOH Interventions        Transportation Interventions Intervention Not Indicated -- -- -- -- --  Utilities Interventions Intervention Not Indicated -- -- -- -- --  Depression Interventions/Treatment  -- -- Medication -- -- --  Financial Strain  Interventions -- Intervention Not Indicated -- -- -- --  Stress Interventions -- -- -- [employer], Live Life Well -- [employer]  Social Connections Interventions -- Intervention Not Indicated -- -- Intervention Not Indicated --       Care Plan  Allergies  Allergen Reactions   Reglan [Metoclopramide] Hives and Shortness Of Breath   Contrave [Naltrexone-Bupropion Hcl Er] Other (See Comments)    Had some reaction to the medication   Medications Reviewed Today     Reviewed by [name], RN (Registered Nurse) on 07/27/22 at 1528  Med List Status: <None>   Medication Order Taking? Sig Documenting Provider Last Dose Status Informant  albuterol (VENTOLIN HFA) 108 (90 Base) MCG/ACT inhaler [id number] No Inhale 2 puffs into the lungs every 6 (six) hours as needed for wheezing or shortness of breath. [name], DO Taking Active   cetirizine (ZYRTEC) 10 MG tablet [id number] No Take 10 mg by mouth daily as needed for allergies. [provider] Taking Active   chlorhexidine (PERIDEX) 0.12 % solution [id number] No Use as directed 10 mLs in the mouth or throat. [provider] Taking Active   escitalopram (LEXAPRO) 10 MG tablet [id number]  5 mg daily for one week, then 10 mg daily Hisada, Barbee Cough, MD  Active   fluticasone (FLONASE) 50 MCG/ACT nasal spray [id number] No Place 1 spray into both nostrils daily. Use for 4-6 weeks then stop and use seasonally or as needed. Lorre Munroe, NP Taking Active   hydrOXYzine (ATARAX) 10 MG tablet [id number] No Take 1 tablet (10 mg total) by mouth 3 (three) times daily as needed for anxiety. [name], DO Taking Active   metoprolol tartrate (LOPRESSOR) 25 MG tablet [id number] No Take 0.5 tablets (12.5 mg total) by mouth 2 (two) times daily. Fransico Michael,  Cadence H, PA-C Taking Active   montelukast (SINGULAIR) 10 MG tablet [id number] No Take 10 mg by mouth at bedtime as needed.  [provider] Taking Active   Multiple Vitamins-Minerals (ONE-A-DAY WOMENS PO) [id number] No Take 1 tablet by mouth daily. [provider] Taking Active   OVER THE COUNTER MEDICATION [id number] No Goli ashwaganda gummy 1/2 daily [provider] Taking Active   pantoprazole (PROTONIX) 40 MG tablet [id number] No Take 1 tablet (40 mg total) by mouth daily. Fransico Michael, Cadence H, PA-C Taking Active   Spacer/Aero-Holding Chambers (AEROCHAMBER MV) inhaler [id number] No Use as instructed [name], DO Taking Active   Vitamin D, Ergocalciferol, (DRISDOL) 1.25 MG (50000 UNIT) CAPS capsule [id number] No Take 1 capsule (50,000 Units total) by mouth every 7 (seven) days. [name], DO Taking Active            Patient Active Problem List   Diagnosis Date Noted   Vitamin B12 deficiency 06/09/2022   Palpitations 01/21/2022   Shortness of breath 01/21/2022   Chest pain 01/21/2022   Generalized anxiety disorder with panic attacks 09/27/2021   Environmental and seasonal allergies 11/04/2020   Genital warts 09/11/2018   Positive TB test 09/25/2017   Allergic contact dermatitis due to metals 05/17/2017   Pre-diabetes 03/06/2017   Hyperlipidemia 03/06/2017   Allergic reaction 02/08/2017   Urticaria due to food allergy 02/08/2017   Vitamin D deficiency 11/08/2016   Iron deficiency anemia due to chronic blood loss 10/03/2016   Menorrhagia with regular cycle 06/27/2016   Mild persistent asthma without complication 06/17/2016   Anxiety and depression 06/17/2016   Morbid obesity (HCC) 06/17/2016   PTSD (post-traumatic stress disorder) 2016   Conditions to be addressed/monitored per PCP order:  Chronic healthcare management needs, asthma, anxiety, depression, HLD, PTSD, panic attacks  Care Plan : RN Care Manager Plan of Care  Updates made by [name], RN since 07/27/2022 12:00 AM     Problem: Health Promotion or Disease Self-Management (General Plan  of Care)      Long-Range Goal: Chronic Disease Management and Care Coordination Needs   Expected End Date: 10/26/2022  Priority: High  Note:   Current Barriers:  Knowledge Deficits related to plan of care for management of Asthma, anxiety  Chronic Disease Management support and education needs related to Asthma, anxiety 07/27/22:  Patient states she moved to an apartment yesterday with family as landlord did not renew lease, 2 family members recently died and brother in ICU.  Patient still seeing Psychiatrist and last saw 8/22.  Disability denied.  Patient states she is doing okay right now, support provided.  RNCM Clinical Goal(s):  Patient will verbalize understanding of plan for management of Asthma, anxiety as evidenced by patient report verbalize basic understanding of  Asthma disease process and self health management plan as evidenced by patient report take all medications exactly as prescribed and will call provider for medication related questions as evidenced by patient report demonstrate understanding of rationale for each prescribed medication as evidenced by patient report attend all scheduled medical appointments: as evidenced by patient report continue to work with RN Care Manager to address care management and care coordination needs related to  Asthma as evidenced by adherence to CM Team Scheduled appointments work with social worker to address  related to the management of Mental Health Concerns  related to the management of Anxiety, Depression, and PTSD, panic attacks as evidenced by review of EMR and patient or social worker report  experience decrease in ED visits as evidenced by EMR review.  ED visits in in last 6 months = 13 through collaboration with RN Care manager, provider, and care team-ED visit 7/18 for fatigue, insomnia  Interventions: Inter-disciplinary care team collaboration (see longitudinal plan of care) Evaluation of current treatment plan related to  self  management and patient's adherence to plan as established by provider  Asthma: (Status:New goal.) Long Term Goal Advised patient to track and manage Asthma triggers Advised patient to self assesses Asthma action plan zone and make appointment with provider if in the yellow zone for 48 hours without improvement Discussed the importance of adequate rest and management of fatigue with Asthma Assessed social determinant of health barriers  Collaborated with SW SW referral for anxiety/depression/panic attacks-completed  Patient Goals/Self-Care Activities: Take all medications as prescribed Attend all scheduled provider appointments Call pharmacy for medication refills 3-7 days in advance of running out of medications Perform all self care activities independently  Perform IADL's (shopping, preparing meals, housekeeping, managing finances) independently Call provider office for new concerns or questions  Work with the social worker to address care coordination needs and will continue to work with the clinical team to address health care and disease management related needs Patient to contact Pharmacy and/or Provider regarding medication  Follow Up Plan:  The patient has been provided with contact information for the care management team and has been advised to call with any health related questions or concerns.  The care management team will reach out to the patient again over the next 30 business  days.     Long-Range Goal: Establish Plan of Care for Chronic Disease Management Needs   Priority: High  Note:   Timeframe:  Long-Range Goal Priority:  High Start Date:   03/07/22                          Expected End Date:     ongoing                  Follow Up Date 08/31/22   - schedule appointment for flu shot - schedule appointment for vaccines needed due to my age or health - schedule recommended health tests (blood work, mammogram, colonoscopy, pap test) - schedule and keep appointment  for annual check-up    Why is this important?   Screening tests can find diseases early when they are easier to treat.  Your doctor or nurse will talk with you about which tests are important for you.  Getting shots for common diseases like the flu and shingles will help prevent them.  07/27/22:  EEG WNL 07/02/22, seeing Psychiatrist    Follow Up:  Patient agrees to Care Plan and Follow-up.  Plan: The Managed Medicaid care management team will reach out to the patient again over the next 30 business  days. and The  Patient has been provided with contact information for the Managed Medicaid care management team and has been advised to call with any health related questions or concerns.  Date/time of next scheduled RN care management/care coordination outreach: 08/31/22 at 0900.

## 2022-08-08 ENCOUNTER — Other Ambulatory Visit: Payer: Self-pay

## 2022-08-08 NOTE — Patient Outreach (Signed)
Medicaid Managed Care Social Work Note  08/08/2022 Name:  Jasmine Gordon MRN:  [id number] DOB:  1986/10/26  Jasmine Gordon is an 36 y.o. year old female who is a primary patient of Jasmine Hauser, DO.  The Medicaid Managed Care Coordination team was consulted for assistance with:  Community Resources   [name] was given information about Medicaid Managed Care Coordination team services today. [name] agreed to services and verbal consent obtained.  Engaged with patient  for by telephone forfollow up visit in response to referral for case management and/or care coordination services.   Assessments/Interventions:  Review of past medical history, allergies, medications, health status, including review of consultants reports, laboratory and other test data, was performed as part of comprehensive evaluation and provision of chronic care management services.  SDOH: (Social Determinant of Health) assessments and interventions performed: SDOH Interventions    Flowsheet Row Patient Outreach Telephone from 07/27/2022 in [place] Patient Outreach Telephone from 07/13/2022 in [place] Office Visit from 07/12/2022 in [place] Patient Outreach Telephone from 06/29/2022 in [place] Patient Outreach Telephone from 06/20/2022 in [place] Patient Outreach Telephone from 05/18/2022 in [place] Interventions        Transportation Interventions Intervention Not Indicated -- -- -- -- --  Utilities Interventions Intervention Not Indicated -- -- -- -- --  Depression Interventions/Treatment  -- -- Medication -- -- --  Financial Strain Interventions -- Intervention Not Indicated -- -- -- --  Stress Interventions -- -- --  [employer], Live Life Well -- [employer]  Social Connections Interventions -- Intervention Not Indicated -- -- Intervention Not Indicated --     BSW completed a telephone outreach with patient, she stated none of the resources provided worked, but they were able to move but have to pay the money back. Patient states she is about to lose her car, BSW researched for resources that may help with car payments but was unsuccessful.   Advanced Directives Status:  Not addressed in this encounter.  Care Plan                 Allergies  Allergen Reactions   Reglan [Metoclopramide] Hives and Shortness Of Breath   Contrave [Naltrexone-Bupropion Hcl Er] Other (See Comments)    Had some reaction to the medication    Medications Reviewed Today     Reviewed by Gayla Medicus, RN (Registered Nurse) on 07/27/22 at 41  Med List Status: <None>   Medication Order Taking? Sig Documenting Provider Last Dose Status Informant  albuterol (VENTOLIN HFA) 108 (90 Base) MCG/ACT inhaler [id number] No Inhale 2 puffs into the lungs every 6 (six) hours as needed for wheezing or shortness of breath. Jasmine Hauser, DO Taking Active   cetirizine (ZYRTEC) 10 MG tablet [id number] No Take 10 mg by mouth daily as needed for allergies. [provider] Taking Active   chlorhexidine (PERIDEX) 0.12 % solution [id number] No Use as directed 10 mLs in the mouth or throat. [provider] Taking Active   escitalopram (LEXAPRO) 10 MG tablet [id number]  5 mg daily for one week, then 10 mg daily Hisada, Elie Goody, MD  Active   fluticasone (FLONASE) 50 MCG/ACT nasal spray [id number] No Place 1 spray into both nostrils daily. Use for 4-6 weeks then stop and use seasonally  or as needed. Lorre Munroe, NP Taking Active   hydrOXYzine (ATARAX) 10 MG tablet [id number] No Take 1 tablet (10 mg total) by mouth 3 (three) times daily as needed for anxiety. [name], DO Taking Active   metoprolol tartrate (LOPRESSOR) 25 MG tablet [id number] No Take 0.5 tablets (12.5 mg total) by mouth 2 (two) times daily. Furth, Cadence H, PA-C Taking Active   montelukast (SINGULAIR) 10 MG tablet [id number] No Take 10 mg by mouth at bedtime as needed. [provider] Taking Active   Multiple Vitamins-Minerals (ONE-A-DAY WOMENS PO) [id number] No Take 1 tablet by mouth daily. [provider] Taking Active   OVER THE COUNTER MEDICATION [id number] No Goli ashwaganda gummy 1/2 daily [provider] Taking Active   pantoprazole (PROTONIX) 40 MG tablet [id number] No Take 1 tablet (40 mg total) by mouth daily. Fransico Michael, Cadence H, PA-C Taking Active   Spacer/Aero-Holding Chambers (AEROCHAMBER MV) inhaler [id number] No Use as instructed [name], DO Taking Active   Vitamin D, Ergocalciferol, (DRISDOL) 1.25 MG (50000 UNIT) CAPS capsule [id number] No Take 1 capsule (50,000 Units total) by mouth every 7 (seven) days. [name], DO Taking Active             Patient Active Problem List   Diagnosis Date Noted   Vitamin B12 deficiency 06/09/2022   Palpitations 01/21/2022   Shortness of breath 01/21/2022   Chest pain 01/21/2022   Generalized anxiety disorder with panic attacks 09/27/2021   Environmental and seasonal allergies 11/04/2020   Genital warts 09/11/2018   Positive TB test 09/25/2017   Allergic contact dermatitis due to metals 05/17/2017   Pre-diabetes 03/06/2017   Hyperlipidemia 03/06/2017   Allergic reaction 02/08/2017   Urticaria due to food allergy 02/08/2017   Vitamin D deficiency 11/08/2016   Iron deficiency anemia due to chronic blood loss 10/03/2016   Menorrhagia with regular cycle 06/27/2016   Mild persistent asthma without complication 06/17/2016   Anxiety and depression 06/17/2016   Morbid obesity (HCC) 06/17/2016   PTSD (post-traumatic stress disorder) 2016    Conditions to be addressed/monitored per  PCP order:   community resources  There are no care plans that you recently modified to display for this patient.   Follow up:  Patient agrees to Care Plan and Follow-up.  Plan: The Managed Medicaid care management team will reach out to the patient again over the next 30-45 days.  Date/time of next scheduled Social Work care management/care coordination outreach:  09/16/22  [name], [name], [facility]  [facility]  High Risk Managed Medicaid Team  [phone]

## 2022-08-08 NOTE — Patient Instructions (Signed)
Visit Information  [name] was given information about Medicaid Managed Care team care coordination services as a part of their Healthy [facility] Medicaid benefit. Jasmine Gordon verbally consented to engagement with the [facility] Managed Care team.   If you are experiencing a medical emergency, please call 911 or report to your local emergency department or urgent care.   If you have a non-emergency medical problem during routine business hours, please contact your provider's office and ask to speak with a nurse.   For questions related to your Healthy [facility] health plan, please call: [phone] or visit the homepage here: [email or web address]  If you would like to schedule transportation through your Healthy [facility] plan, please call the following number at least 2 days in advance of your appointment: [phone]  For information about your ride after you set it up, call Ride Assist at [phone]. Use this number to activate a Will Call pickup, or if your transportation is late for a scheduled pickup. Use this number, too, if you need to make a change or cancel a previously scheduled reservation.  If you need transportation services right away, call [phone]. The after-hours call center is staffed 24 hours to handle ride assistance and urgent reservation requests (including discharges) 365 days a year. Urgent trips include sick visits, hospital discharge requests and life-sustaining treatment.  Call the [facility] at [phone], at any time, 24 hours a day, 7 days a week. If you are in danger or need immediate medical attention call 911.  If you would like help to quit smoking, call 1-800-QUIT-NOW 8192452058) OR Espaol: 1-855-Djelo-Ya (8-144-818-5631) o para ms informacin haga clic aqu or Text READY to 200-400 to register via text  [name] following are the goals we discussed in your visit  today:   Goals Addressed   None      Social Worker will follow up in 30-45 days .   Jasmine Gordon, BSW, [place] Managed Medicaid Team  [phone]   Following is a copy of your plan of care:  There are no care plans that you recently modified to display for this patient.

## 2022-08-09 ENCOUNTER — Ambulatory Visit (INDEPENDENT_AMBULATORY_CARE_PROVIDER_SITE_OTHER): Payer: Medicaid Other | Admitting: Licensed Clinical Social Worker

## 2022-08-09 DIAGNOSIS — F4310 Post-traumatic stress disorder, unspecified: Secondary | ICD-10-CM

## 2022-08-09 DIAGNOSIS — F411 Generalized anxiety disorder: Secondary | ICD-10-CM | POA: Diagnosis not present

## 2022-08-09 NOTE — Progress Notes (Signed)
Virtual Visit via Video Note  I connected with Jasmine Gordon on 08/09/22 at  9:00 AM EDT by a video enabled telemedicine application and verified that I am speaking with the correct person using two identifiers.  Location: Patient: home Provider: remote office [place], [place])   I discussed the limitations of evaluation and management by telemedicine and the availability of in person appointments. The patient expressed understanding and agreed to proceed.   I discussed the assessment and treatment plan with the patient. The patient was provided an opportunity to ask questions and all were answered. The patient agreed with the plan and demonstrated an understanding of the instructions.   The patient was advised to call back or seek an in-person evaluation if the symptoms worsen or if the condition fails to improve as anticipated.  I provided 45 minutes of non-face-to-face time during this encounter.   Lottie Siska R Hedaya [place], LCSW   THERAPIST PROGRESS NOTE  Session Time: [phone]  Participation Level: Active  Behavioral Response: Neat and Well GroomedAlertAnxious  Type of Therapy: Individual Therapy  Treatment Goals addressed: Problem: PTSD-Trauma Disorder Goal: LTG: Reduce the negative impact trauma related symptoms have on social, occupational, and family functioning per pt self report 3 out of 5 sessions documented.  Outcome: Progressing Goal: STG: Reduction in intrusive event recollections, avoidance of event reminders, intense arousal, or disinterest in activities or relationships. 3 out of 5 sessions documented Outcome: Progressing Intervention: Encourage verbalization of feelings/concerns/expectations Note: Explored w/ pt Intervention: Assist with relaxation techniques, as appropriate (deep breathing exercises, meditation, guided imagery) Note: Reviewed coping skills Intervention: Encourage self-care activities Note: Continued to encourage Intervention: Encourage  compliance with prescribed medication regimen Note: Continued to encourage--pt reports that she decided to not take Lexapro at this time   ProgressTowards Goals: Progressing  Interventions: CBT  Summary: Jasmine Gordon is a 36 y.o. female who presents with improving symptoms related to PTSD and anxiety. Pt reports that she has not been taking the Lexapro "I just don't like medicines and have not had good experience with medicines in the past". Pt reports that overall mood is stable and that she is managing stress/anxiety better than in previous sessions.   Allowed pt to explore and express thoughts and feelings associated with recent life situations and external stressors. Discussed stress associated with ongoing disability case. Patient reports that her case was denied due to insufficient data to support disability based on mental health needs. Patient reports that she submitted an appeal and is currently waiting. Patient reports that she cannot let her bills go unpaid while she is waiting for disability, so she has several job interviews scheduled. 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 Patient reports that this will be a full time job complete with benefits, so patient is excited about that possibility. 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  Patient reports that she's now currently living in an apartment after having to leave her previous residence quickly (within 30 days). Patient reports that so far it's a good move, and she feels like she is in a peaceful environment. Patient reports that the apartment is on the 3rd floor, so she has to go up and down steps daily which is a positive factor in overall Wellness.  Patient reports additional  stress triggered by recent knowledge that her husband was offered to either resign from his job, or be transferred to [employer] [facility]. Patient reports that she is absolutely not moving to [place], so her husband will probably be leaving his job.  Continued recommendations are as follows: self care behaviors, positive social engagements, focusing on overall work/home/life balance, and focusing on positive physical and emotional wellness.   Suicidal/Homicidal: No  Therapist Response: The ongoing treatment plan includes: therapeutic work on building and maintaining self-care; addressing problematic coping strategies and mechanisms; and helping the patient gain greater awareness, understanding, and expression of underlying emotions. Treatment continues to show good evolution and development. Treatment to continue as indicated.  Plan: Return again in 4 weeks.  Diagnosis:  Encounter Diagnoses  Name Primary?   PTSD (post-traumatic stress disorder) Yes   GAD (generalized anxiety disorder)    Collaboration of Care: Other Pt to continue care with psychiatrist of record, Dr. Modesta Messing  Patient/Guardian was advised Release of Information must be obtained prior to any record release in order to collaborate their care with an outside provider. Patient/Guardian was advised if they have not already done so to contact the registration department to sign all necessary forms in order for [place] to release information regarding their care.   Consent: Patient/Guardian gives verbal consent for treatment and assignment of benefits for services provided during this visit. Patient/Guardian expressed understanding and agreed to proceed.   [place], LCSW 08/09/2022

## 2022-08-09 NOTE — Plan of Care (Signed)
  Problem: PTSD-Trauma Disorder Goal: LTG: Reduce the negative impact trauma related symptoms have on social, occupational, and family functioning per pt self report 3 out of 5 sessions documented.  Outcome: Progressing Goal: STG: Reduction in intrusive event recollections, avoidance of event reminders, intense arousal, or disinterest in activities or relationships. 3 out of 5 sessions documented Outcome: Progressing Intervention: Encourage verbalization of feelings/concerns/expectations Note: Explored w/ pt Intervention: Assist with relaxation techniques, as appropriate (deep breathing exercises, meditation, guided imagery) Note: Reviewed coping skills Intervention: Encourage self-care activities Note: Continued to encourage Intervention: Encourage compliance with prescribed medication regimen Note: Continued to encourage

## 2022-08-09 NOTE — Plan of Care (Signed)
  Problem: PTSD-Trauma Disorder Goal: LTG: Reduce the negative impact trauma related symptoms have on social, occupational, and family functioning per pt self report 3 out of 5 sessions documented.  Outcome: Progressing Goal: STG: Reduction in intrusive event recollections, avoidance of event reminders, intense arousal, or disinterest in activities or relationships. 3 out of 5 sessions documented Outcome: Progressing Intervention: Encourage verbalization of feelings/concerns/expectations Note: Explored w/ pt Intervention: Assist with relaxation techniques, as appropriate (deep breathing exercises, meditation, guided imagery) Note: Reviewed coping skills Intervention: Encourage self-care activities Note: Continued to encourage Intervention: Encourage compliance with prescribed medication regimen Note: Continued to encourage--pt reports that she decided to not take Lexapro at this time

## 2022-08-17 ENCOUNTER — Other Ambulatory Visit: Payer: Self-pay | Admitting: Licensed Clinical Social Worker

## 2022-08-17 NOTE — Patient Outreach (Signed)
Medicaid Managed Care Social Work Note  08/17/2022 Name:  Jasmine Gordon MRN:  [id number] DOB:  26-Mar-1986  Jasmine Gordon is an 36 y.o. year old female who is a primary patient of [name], DO.  The Medicaid Managed Care Coordination team was consulted for assistance with:  [place] and Resources  [name] was given information about Medicaid Managed Care Coordination team services today. [name] agreed to services and verbal consent obtained.  Engaged with patient  for by telephone forfollow up visit in response to referral for case management and/or care coordination services.   Assessments/Interventions:  Review of past medical history, allergies, medications, health status, including review of consultants reports, laboratory and other test data, was performed as part of comprehensive evaluation and provision of chronic care management services.  SDOH: (Social Determinant of Health) assessments and interventions performed: SDOH Interventions    Flowsheet Row Patient Outreach Telephone from 08/17/2022 in [place] Patient Outreach Telephone from 07/27/2022 in [place] Patient Outreach Telephone from 07/13/2022 in [place] Patient Outreach Telephone from 06/29/2022 in [place] Patient Outreach Telephone from 06/20/2022 in [place] Patient Outreach Telephone from 05/18/2022 in [place] Coordination  SDOH Interventions        Transportation Interventions -- Intervention Not Indicated -- -- -- --  Utilities Interventions -- Intervention Not Indicated -- -- -- --  Financial Strain Interventions -- -- Intervention Not Indicated -- -- --  Stress Interventions Provide Counseling, [place], Live Life Well -- [employer]  Social Connections Interventions -- -- Intervention Not Indicated -- Intervention Not Indicated --       Advanced Directives Status:  See Care Plan for related entries.  Care Plan                 Allergies  Allergen Reactions   Reglan [Metoclopramide] Hives and Shortness Of Breath   Contrave [Naltrexone-Bupropion Hcl Er] Other (See Comments)    Had some reaction to the medication    Medications Reviewed Today     Reviewed by Greg Cutter, LCSW (Social Worker) on 08/17/22 at 1310  Med List Status: <None>   Medication Order Taking? Sig Documenting Provider Last Dose Status Informant  albuterol (VENTOLIN HFA) 108 (90 Base) MCG/ACT inhaler [id number] No Inhale 2 puffs into the lungs every 6 (six) hours as needed for wheezing or shortness of breath. [name], DO Taking Active   cetirizine (ZYRTEC) 10 MG tablet [id number] No Take 10 mg by mouth daily as needed for allergies. [provider] Taking Active   chlorhexidine (PERIDEX) 0.12 % solution [id number] No Use as directed 10 mLs in the mouth or throat. [provider] Taking Active   escitalopram (LEXAPRO) 10 MG tablet [id number]  5 mg daily for one week, then 10 mg daily Hisada, Elie Goody, MD  Active   fluticasone (FLONASE) 50 MCG/ACT nasal spray [id number] No Place 1 spray into both nostrils daily. Use for 4-6 weeks then stop and use seasonally or as needed. Jearld Fenton, NP Taking Active   hydrOXYzine (ATARAX) 10 MG tablet [id number] No Take 1 tablet (10 mg total) by mouth 3 (three) times daily as needed for anxiety. [name], DO Taking Active   metoprolol tartrate (LOPRESSOR) 25 MG tablet [id number]  No Take 0.5 tablets (12.5 mg total) by mouth 2 (two) times daily. Furth, Cadence H, PA-C Taking Expired 08/16/22 2359   montelukast (SINGULAIR) 10 MG tablet [id number] No  Take 10 mg by mouth at bedtime as needed. [provider] Taking Active   Multiple Vitamins-Minerals (ONE-A-DAY WOMENS PO) [id number] No Take 1 tablet by mouth daily. [provider] Taking Active   OVER THE COUNTER MEDICATION [id number] No Goli ashwaganda gummy 1/2 daily [provider] Taking Active   pantoprazole (PROTONIX) 40 MG tablet [id number] No Take 1 tablet (40 mg total) by mouth daily. Kathlen Mody, Cadence H, PA-C Taking Active   Spacer/Aero-Holding Chambers (AEROCHAMBER MV) inhaler [id number] No Use as instructed [name], DO Taking Active   Vitamin D, Ergocalciferol, (DRISDOL) 1.25 MG (50000 UNIT) CAPS capsule [id number] No Take 1 capsule (50,000 Units total) by mouth every 7 (seven) days. [name], DO Taking Active             Patient Active Problem List   Diagnosis Date Noted   Vitamin B12 deficiency 06/09/2022   Palpitations 01/21/2022   Shortness of breath 01/21/2022   Chest pain 01/21/2022   Generalized anxiety disorder with panic attacks 09/27/2021   Environmental and seasonal allergies 11/04/2020   Genital warts 09/11/2018   Positive TB test 09/25/2017   Allergic contact dermatitis due to metals 05/17/2017   Pre-diabetes 03/06/2017   Hyperlipidemia 03/06/2017   Allergic reaction 02/08/2017   Urticaria due to food allergy 02/08/2017   Vitamin D deficiency 11/08/2016   Iron deficiency anemia due to chronic blood loss 10/03/2016   Menorrhagia with regular cycle 06/27/2016   Mild persistent asthma without complication 0000000   Anxiety and depression 06/17/2016   Morbid obesity ([place]) 06/17/2016   PTSD (post-traumatic stress disorder) 2016    Conditions to be addressed/monitored per PCP order:  Anxiety and Depression  Care Plan : LCSW Plan of Care  Updates made by Greg Cutter, LCSW since 08/17/2022 12:00 AM     Problem: Anxiety Identification (Anxiety)      Long-Range Goal: To find a long term counselor  in order to learn how to alleviate my anxiety and stress   Start Date: 02/14/2022  Priority: High  Note:   Priority: High   Timeframe:  Long-Range Goal Priority:  High Start Date:   02/14/22               Expected End Date:  ongoing. Goal reopened on 06/21/22 as patient prefers to speak directly to [facility] LCSW for mental health support, resources and coping skill education,    Follow Up Date---08/17/22 at 1 pm.   - check out counseling - keep 90 percent of counseling appointments - schedule counseling appointment    Why is this important?             Beating depression may take some time.            If you don't feel better right away, don't give up on your treatment plan.     Current barriers:   Chronic Mental Health needs related to anxiety, panic and stress management  Mental Health Concerns  and Social Isolation Needs Support, Education, and Care Coordination in order to meet unmet mental health needs.   Clinical Goal(s): verbalize understanding of plan for management of Anxiety, Panic and Stress     Clinical Interventions:  Assessed patient's previous and current treatment, coping skills, support system and barriers to care. Patient and spouse provided  hx  Verbalization of feelings encouraged, motivational interviewing employed Emotional support provided, positive coping strategies explored Self care/establishing healthy boundaries emphasized Patient reports that her anxiety has increased which has affected her ability to carry out work and function daily.  Patient receives strong support from spouse Patient is agreeable to referral to [facility] for counseling. Patient has already started psychiatry treatment there. [facility] LCSW made referral on 02/14/22. Update 03/14/22- Patient continues to receive psychiatry from [facility] but they do not have any available therapist for counseling. Patient is in need of counseling as her stress has increased since she had to start back work last week. She reports  that her disability is still processing and she is very frustrated with this process. [facility] LCSW sent patient a secure email with [facility] contact information. She is agreeable to contact them to schedule an intake appointment for counseling. Patient did not want [facility]. She reports that she cannot talk long because she is back at work today and experiencing extreme stress. Emotional support provided to patient. UPDATE 05/18/22- Patient completed her first counseling session at [facility] where she receives psychiatry services at. Patient reports that her mental health has improved drastically and her counselor suggested a follow up counseling appointment in August. Her counseling session was on 05/16/22. She has a psychiatry appointment tomorrow. She reports being satisfied with her current mental health support network team. She ask that [facility] LCSW email her with contact information in case any mental health concerns arise in the future. Patient is agreeable to social work case closure. [place] LCSW will update [facility] RNCM. UPDATE- 06/29/22, patient is requesting community resource assistance. Referral placed to [facility] BSW and appointment was scheduled for next week. Patient is experiencing ongoing financial stressors and is at risk of losing her source of transportation which would affect her ability to keep appointments. [facility] LCSW questioned if patient was able to find a different psychiatrist at [facility] as she was unable to build a strong rapport with her last one. Patient will contact [place] today to reschedule psychiatry appointment that she missed and to question about this. [facility] LCSW will follow up in 14 days to make sure patient was able to be established with a psychiatrist. Patient successfully completed therapy session with her [facility] counselor on 06/27/22. Patient recently started back work which has negatively affected her mental health (3 days a week [facility] [employer]). Patient reports that she experiences both anxiety and panic  attacks. She shares that she felt alone most days until she joined an anxiety management support group on Facebook. She was receptive to anxiety and depression management coping skill education. LCSW provided education on relaxation techniques such as meditation, deep breathing, massage, grounding exercsies or yoga that can activate the body's relaxation response and ease symptoms of stress and anxiety. LCSW ask that when pt is struggling with difficult emotions and racing thoughts that they start this relaxation response process. LCSW provided extensive education on healthy coping skills for anxiety. SW used active and reflective listening, validated patient's feelings/concerns, and provided emotional support.   Motivational Interviewing employed Depression screen reviewed  PHQ2/ PHQ9 completed Mindfulness or Relaxation training provided Active listening / Reflection utilized  Emotional Support Provided Problem [place] strategies reviewed Provided psychoeducation for mental health needs  Provided brief CBT  Reviewed mental health medications and discussed importance of compliance:  Quality of sleep assessed & Sleep Hygiene techniques promoted  Participation in counseling encouraged  Verbalization of feelings encouraged  Suicidal Ideation/Homicidal Ideation assessed:  Patient denies SI/HI  Review resources, discussed options and provided patient information about  [place] care team collaboration (see longitudinal plan of care) UPDATE 04/13/22- Patient has completed counseling session at [facility] and has upcoming appointment on 05/16/22. However, patient was a no show for her psychiatry appointment and did not reschedule this. [facility] LCSW completed joint call with patient to [facility] and was able to schedule her psychiatry appointment for today at 4:00 pm. This appointment will be virtual. [facility] LCSW emailed patient a list of coping skills for anxiety, depression and  stress which include the following: When your car dies or a deadline looms, how do you respond? Long-term, low-grade or acute stress takes a serious toll on your body and mind, so don't ignore feelings of constant tension. Stress is a natural part of life. However, too much stress can harm our health, especially if it continues every day. This is chronic stress and can put you at risk for heart problems like heart disease and depression. Understand what's happening inside your body and learn simple coping skills to combat the negative impacts of everyday stressors. UPDATE 06/21/22- Patient reports that she does NOT wish to continue psychiatry services at [facility] because she did not form good rapport with psychiatrist there. She reports that she prefers for Dr. Raliegh Ip (her pcp) to manage her mental health medications. UPDATE 07/13/22- Patient is stressed about her current housing situation as she has to find somewhere else to reside once her lease ends this year. Emotional support provided. Patient reports that her recent counseling and psychiatry appointment went very well. She reports that her psychiatrist appointment went very well yesterday. This visit was in person which patient prefers. Patient reports that's her psychiatrist prescribed her Anner Crete which has effectively worked for her in the past. [facility] LCSW will follow up next month to provide education on self-care, stress management and coping skills. UPDATE- 9//27/23, Patient confirms that she is completing monthly visits with her counselor. She reports that this is going well and she has been able to build great rapport with this therapist. Patient is also actively seeing a psychiatrist at [facility] as well. She was reminded of her upcoming mental health appointments. Patient is agreeable to [facility] social work discharge as she has now been connected and established with long term mental health providers. Patient denies any further needs. She was sent an email with final  recommendations and contact information. [place] LCSW will update [facility] RNCM as well.  Types of Stress There are two types of stress: Emotional - types of emotional stress are relationship problems, pressure at work, financial worries, experiencing discrimination or having a major life change. Physical - Examples of physical stress include being sick having pain, not sleeping well, recovery from an injury or having an alcohol and drug use disorder. Fight or Flight Sudden or ongoing stress activates your nervous system and floods your bloodstream with adrenaline and cortisol, two hormones that raise blood pressure, increase heart rate and spike blood sugar. These changes pitch your body into a fight or flight response. That enabled our ancestors to outrun saber-toothed tigers, and it's helpful today for situations like dodging a car accident. But most modern chronic stressors, such as finances or a challenging relationship, keep your body in that heightened state, which hurts your health. Effects of Too Much Stress If constantly under stress, most of [place] will eventually start to function less well.  Multiple studies link chronic stress to a higher risk of heart disease,  stroke, depression, weight gain, memory loss and even premature death, so it's important to recognize the warning signals. Talk to your doctor about ways to manage stress if you're experiencing any of these symptoms: Prolonged periods of poor sleep. Regular, severe headaches. Unexplained weight loss or gain. Feelings of isolation, withdrawal or worthlessness. Constant anger and irritability. Loss of interest in activities. Constant worrying or obsessive thinking. Excessive alcohol or drug use. Inability to concentrate.   10 Ways to Cope with Chronic Stress It's key to recognize stressful situations as they occur because it allows you to focus on managing how you react. We all need to know when to close our eyes and take a deep breath  when we feel tension rising. Use these tips to prevent or reduce chronic stress. 1. Rebalance Work and Home All work and no play? If you're spending too much time at the office, intentionally put more dates in your calendar to enjoy time for fun, either alone or with others. 2. Get Regular Exercise Moving your body on a regular basis balances the nervous system and increases blood circulation, helping to flush out stress hormones. Even a daily 20-minute walk makes a difference. Any kind of exercise can lower stress and improve your mood ? just pick activities that you enjoy and make it a regular habit. 3. Eat Well and Limit Alcohol and Stimulants Alcohol, nicotine and caffeine may temporarily relieve stress but have negative health impacts and can make stress worse in the long run. Well-nourished bodies cope better, so start with a good breakfast, add more organic fruits and vegetables for a well-balanced diet, avoid processed foods and sugar, try herbal tea and drink more water. 4. Connect with Supportive People Talking face to face with another person releases hormones that reduce stress. Lean on those good listeners in your life. 5. Carve Out Hobby Time Do you enjoy gardening, reading, listening to music or some other creative pursuit? Engage in activities that bring you pleasure and joy; research shows that reduces stress by almost half and lowers your heart rate, too. 6. Practice Meditation, Stress Reduction or Yoga Relaxation techniques activate a state of restfulness that counterbalances your body's fight-or-flight hormones. Even if this also means a 10-minute break in a long day: listen to music, read, go for a walk in nature, do a hobby, take a bath or spend time with a friend. Also consider taking a mindfulness-based stress reduction course to learn effective, lasting tools or try a daily deep breathing or imagery practice. Deep Breathing Slow, calm and deep breathing can help you relax. Try  these steps to focus on your breathing and repeat as needed. Find a comfortable position and close your eyes. Exhale and drop your shoulders. Breathe in through your nose; fill your lungs and then your belly. Think of relaxing your body, quieting your mind and becoming calm and peaceful. Breathe out slowly through your nose, relaxing your belly. Think of releasing tension, pain, worries or distress. Repeat steps three and four until you feel relaxed. Imagery This involves using your mind to excite the senses -- sound, vision, smell, taste and feeling. This may help ease your stress. Begin by getting comfortable and then do some slow breathing. Imagine a place you love being at. It could be somewhere from your childhood, somewhere you vacationed or just a place in your imagination. Feel how it is to be in the place you're imagining. Pay attention to the sounds, air, colors, and who is there with you.  This is a place where you feel cared for and loved. All is well. You are safe. Take in all the smells, sounds, tastes and feelings. As you do, feel your body being nourished and healed. Feel the calm that surrounds you. Breathe in all the good. Breathe out any discomfort or tension. 7. Sleep Enough If you get less than seven to eight hours of sleep, your body won't tolerate stress as well as it could. If stress keeps you up at night, address the cause and add extra meditation into your day to make up for the lost z's. Try to get seven to nine hours of sleep each night. Make a regular bedtime schedule. Keep your room dark and cool. Try to avoid computers, TV, cell phones and tablets before bed. 8. Bond with Connections You Enjoy Go out for a coffee with a friend, chat with a neighbor, call a family member, visit with a clergy member, or even hang out with your pet. Clinical studies show that spending even a short time with a companion animal can cut anxiety levels almost in half. 9. Take a  Vacation Getting away from it all can reset your stress tolerance by increasing your mental and emotional outlook, which makes you a happier, more productive person upon return. Leave your cellphone and laptop at home! 10. See a Counselor, Coach or Therapist If negative thoughts overwhelm your ability to make positive changes, it's time to seek professional help. Make an appointment today--your health and life are worth it.   Patient Goals/Self-Care Activities: Over the next 120 days Attend scheduled medical appointments Utilize healthy coping skills and supportive resources discussed Contact PCP with any questions or concerns   Follow up goal       Follow up:  Patient requests no follow-up at this time.  Eula Fried, BSW, MSW, [employer] Managed Medicaid LCSW [email or web address] Phone: [phone]

## 2022-08-17 NOTE — Patient Instructions (Signed)
Visit Information  Jasmine Gordon was given information about Medicaid Managed Care team care coordination services as a part of their Healthy [facility] Medicaid benefit. Jasmine Gordon verbally consented to engagement with the [facility] Managed Care team.   If you are experiencing a medical emergency, please call 911 or report to your local emergency department or urgent care.   If you have a non-emergency medical problem during routine business hours, please contact your provider's office and ask to speak with a nurse.   For questions related to your Healthy [facility] health plan, please call: [phone] or visit the homepage here: [email or web address]  If you would like to schedule transportation through your Healthy [facility] plan, please call the following number at least 2 days in advance of your appointment: [phone]  For information about your ride after you set it up, call Ride Assist at [phone]. Use this number to activate a Will Call pickup, or if your transportation is late for a scheduled pickup. Use this number, too, if you need to make a change or cancel a previously scheduled reservation.  If you need transportation services right away, call [phone]. The after-hours call center is staffed 24 hours to handle ride assistance and urgent reservation requests (including discharges) 365 days a year. Urgent trips include sick visits, hospital discharge requests and life-sustaining treatment.  Call the [place] at [phone], at any time, 24 hours a day, 7 days a week. If you are in danger or need immediate medical attention call 911.  If you would like help to quit smoking, call 1-800-QUIT-NOW 720-264-8480) OR Espaol: 1-855-Djelo-Ya (5-102-585-2778) o para ms informacin haga clic aqu or Text READY to 200-400 to register via text   Following is a copy of your plan of care:  Care Plan : LCSW Plan  of Care  Updates made by Greg Cutter, LCSW since 08/17/2022 12:00 AM     Problem: Anxiety Identification (Anxiety)      Long-Range Goal: To find a long term counselor in order to learn how to alleviate my anxiety and stress   Start Date: 02/14/2022  Priority: High  Note:   Priority: High   Timeframe:  Long-Range Goal Priority:  High Start Date:   02/14/22               Expected End Date:  ongoing. Goal reopened on 06/21/22 as patient prefers to speak directly to [facility] LCSW for mental health support, resources and coping skill education,    Follow Up Date---08/17/22 at 1 pm.   - check out counseling - keep 90 percent of counseling appointments - schedule counseling appointment    Why is this important?             Beating depression may take some time.            If you don't feel better right away, don't give up on your treatment plan.     Current barriers:   Chronic Mental Health needs related to anxiety, panic and stress management  Mental Health Concerns  and Social Isolation Needs Support, Education, and Care Coordination in order to meet unmet mental health needs.   Clinical Goal(s): verbalize understanding of plan for management of Anxiety, Panic and Stress    Types of Stress There are two types of stress: Emotional - types of emotional stress are relationship problems, pressure at work, financial worries, experiencing discrimination or having a major life change. Physical - Examples of physical stress  include being sick having pain, not sleeping well, recovery from an injury or having an alcohol and drug use disorder. Fight or Flight Sudden or ongoing stress activates your nervous system and floods your bloodstream with adrenaline and cortisol, two hormones that raise blood pressure, increase heart rate and spike blood sugar. These changes pitch your body into a fight or flight response. That enabled our ancestors to outrun saber-toothed tigers, and it's helpful today for  situations like dodging a car accident. But most modern chronic stressors, such as finances or a challenging relationship, keep your body in that heightened state, which hurts your health. Effects of Too Much Stress If constantly under stress, most of [place] will eventually start to function less well.  Multiple studies link chronic stress to a higher risk of heart disease, stroke, depression, weight gain, memory loss and even premature death, so it's important to recognize the warning signals. Talk to your doctor about ways to manage stress if you're experiencing any of these symptoms: Prolonged periods of poor sleep. Regular, severe headaches. Unexplained weight loss or gain. Feelings of isolation, withdrawal or worthlessness. Constant anger and irritability. Loss of interest in activities. Constant worrying or obsessive thinking. Excessive alcohol or drug use. Inability to concentrate.   10 Ways to Cope with Chronic Stress It's key to recognize stressful situations as they occur because it allows you to focus on managing how you react. We all need to know when to close our eyes and take a deep breath when we feel tension rising. Use these tips to prevent or reduce chronic stress. 1. Rebalance Work and Home All work and no play? If you're spending too much time at the office, intentionally put more dates in your calendar to enjoy time for fun, either alone or with others. 2. Get Regular Exercise Moving your body on a regular basis balances the nervous system and increases blood circulation, helping to flush out stress hormones. Even a daily 20-minute walk makes a difference. Any kind of exercise can lower stress and improve your mood ? just pick activities that you enjoy and make it a regular habit. 3. Eat Well and Limit Alcohol and Stimulants Alcohol, nicotine and caffeine may temporarily relieve stress but have negative health impacts and can make stress worse in the long run. Well-nourished  bodies cope better, so start with a good breakfast, add more organic fruits and vegetables for a well-balanced diet, avoid processed foods and sugar, try herbal tea and drink more water. 4. Connect with Supportive People Talking face to face with another person releases hormones that reduce stress. Lean on those good listeners in your life. 5. Carve Out Hobby Time Do you enjoy gardening, reading, listening to music or some other creative pursuit? Engage in activities that bring you pleasure and joy; research shows that reduces stress by almost half and lowers your heart rate, too. 6. Practice Meditation, Stress Reduction or Yoga Relaxation techniques activate a state of restfulness that counterbalances your body's fight-or-flight hormones. Even if this also means a 10-minute break in a long day: listen to music, read, go for a walk in nature, do a hobby, take a bath or spend time with a friend. Also consider taking a mindfulness-based stress reduction course to learn effective, lasting tools or try a daily deep breathing or imagery practice. Deep Breathing Slow, calm and deep breathing can help you relax. Try these steps to focus on your breathing and repeat as needed. Find a comfortable position and close your eyes.  Exhale and drop your shoulders. Breathe in through your nose; fill your lungs and then your belly. Think of relaxing your body, quieting your mind and becoming calm and peaceful. Breathe out slowly through your nose, relaxing your belly. Think of releasing tension, pain, worries or distress. Repeat steps three and four until you feel relaxed. Imagery This involves using your mind to excite the senses -- sound, vision, smell, taste and feeling. This may help ease your stress. Begin by getting comfortable and then do some slow breathing. Imagine a place you love being at. It could be somewhere from your childhood, somewhere you vacationed or just a place in your imagination. Feel how it  is to be in the place you're imagining. Pay attention to the sounds, air, colors, and who is there with you. This is a place where you feel cared for and loved. All is well. You are safe. Take in all the smells, sounds, tastes and feelings. As you do, feel your body being nourished and healed. Feel the calm that surrounds you. Breathe in all the good. Breathe out any discomfort or tension. 7. Sleep Enough If you get less than seven to eight hours of sleep, your body won't tolerate stress as well as it could. If stress keeps you up at night, address the cause and add extra meditation into your day to make up for the lost z's. Try to get seven to nine hours of sleep each night. Make a regular bedtime schedule. Keep your room dark and cool. Try to avoid computers, TV, cell phones and tablets before bed. 8. Bond with Connections You Enjoy Go out for a coffee with a friend, chat with a neighbor, call a family member, visit with a clergy member, or even hang out with your pet. Clinical studies show that spending even a short time with a companion animal can cut anxiety levels almost in half. 9. Take a Vacation Getting away from it all can reset your stress tolerance by increasing your mental and emotional outlook, which makes you a happier, more productive person upon return. Leave your cellphone and laptop at home! 10. See a Counselor, Coach or Therapist If negative thoughts overwhelm your ability to make positive changes, it's time to seek professional help. Make an appointment today--your health and life are worth it.   Patient Goals/Self-Care Activities: Over the next 120 days Attend scheduled medical appointments Utilize healthy coping skills and supportive resources discussed Contact PCP with any questions or concerns   Follow up goal

## 2022-08-29 ENCOUNTER — Ambulatory Visit: Payer: Medicaid Other

## 2022-08-31 ENCOUNTER — Telehealth: Payer: Self-pay

## 2022-08-31 ENCOUNTER — Other Ambulatory Visit: Payer: Self-pay | Admitting: Obstetrics and Gynecology

## 2022-08-31 NOTE — Patient Instructions (Signed)
Hi Bronda, thanks for speaking with me-I hope you  have a great day and great success with job hunting today!!  [name] was given information about Medicaid Managed Care team care coordination services as a part of their Healthy [facility] Medicaid benefit. Jasmine Gordon verbally consented to engagement with the [facility] Managed Care team.   If you are experiencing a medical emergency, please call 911 or report to your local emergency department or urgent care.   If you have a non-emergency medical problem during routine business hours, please contact your provider's office and ask to speak with a nurse.   For questions related to your Healthy [facility] health plan, please call: [phone] or visit the homepage here: [email or web address]  If you would like to schedule transportation through your Healthy [facility] plan, please call the following number at least 2 days in advance of your appointment: [phone]  For information about your ride after you set it up, call Ride Assist at [phone]. Use this number to activate a Will Call pickup, or if your transportation is late for a scheduled pickup. Use this number, too, if you need to make a change or cancel a previously scheduled reservation.  If you need transportation services right away, call [phone]. The after-hours call center is staffed 24 hours to handle ride assistance and urgent reservation requests (including discharges) 365 days a year. Urgent trips include sick visits, hospital discharge requests and life-sustaining treatment.  Call the [place] at [phone], at any time, 24 hours a day, 7 days a week. If you are in danger or need immediate medical attention call 911.  If you would like help to quit smoking, call 1-800-QUIT-NOW 925-219-7139) OR Espaol: 1-855-Djelo-Ya (9-323-557-3220) o para ms informacin haga clic aqu or Text READY to 200-400  to register via text  [name] following are the goals we discussed in your visit today:   Goals Addressed    Timeframe:  Long-Range Goal Priority:  High Start Date:   03/07/22                          Expected End Date:     ongoing                  Follow Up Date 09/30/22   - schedule appointment for flu shot - schedule appointment for vaccines needed due to my age or health - schedule recommended health tests (blood work, mammogram, colonoscopy, pap test) - schedule and keep appointment for annual check-up    Why is this important?   Screening tests can find diseases early when they are easier to treat.  Your doctor or nurse will talk with you about which tests are important for you.  Getting shots for common diseases like the flu and shingles will help prevent them.  08/31/22:  patient has appt with Psychiatrist next week and still sees counselor  Patient verbalizes understanding of instructions and care plan provided today and agrees to view in [place]. Active MyChart status and patient understanding of how to access instructions and care plan via MyChart confirmed with patient.     The Managed Medicaid care management team will reach out to the patient again over the next 30 business  days.  The  Patient has been provided with contact information for the Managed Medicaid care management team and has been advised to call with any health related questions or concerns.  [name] RN, BSN [place]  [facility] Care Management Coordinator - Managed Medicaid High Risk [phone]   Following is a copy of your plan of care:  Care Plan : RN Care Manager Plan of Care  Updates made by [name], RN since 2022/09/13 12:00 AM     Problem: Health Promotion or Disease Self-Management (General Plan of Care)      Long-Range Goal: Chronic Disease Management and Care Coordination Needs   Expected End Date: 10/26/2022  Priority: High  Note:   Current  Barriers:  Knowledge Deficits related to plan of care for management of Asthma, anxiety  Chronic Disease Management support and education needs related to Asthma, anxiety 09-13-22:  Patient's Brother died last week-discussed grief counseling-continues to see Theme park manager.  Currently not working-was let go from [employer] and is looking for a new job-considering temp agency and will visit them today for job opportunities.  No breathing difficulties at the moment.  RNCM Clinical Goal(s):  Patient will verbalize understanding of plan for management of Asthma, anxiety as evidenced by patient report verbalize basic understanding of  Asthma disease process and self health management plan as evidenced by patient report take all medications exactly as prescribed and will call provider for medication related questions as evidenced by patient report demonstrate understanding of rationale for each prescribed medication as evidenced by patient report attend all scheduled medical appointments: as evidenced by patient report continue to work with RN Care Manager to address care management and care coordination needs related to  Asthma as evidenced by adherence to CM Team Scheduled appointments work with social worker to address  related to the management of Mental Health Concerns  related to the management of Anxiety, Depression, and PTSD, panic attacks as evidenced by review of EMR and patient or social worker report experience decrease in ED visits as evidenced by EMR review.  ED visits in in last 6 months = 13 through collaboration with RN Care manager, provider, and care team-ED visit 7/18 for fatigue, insomnia  Interventions: Inter-disciplinary care team collaboration (see longitudinal plan of care) Evaluation of current treatment plan related to  self management and patient's adherence to plan as established by provider  Asthma: (Status:New goal.) Long Term Goal Advised patient to track and  manage Asthma triggers Advised patient to self assesses Asthma action plan zone and make appointment with provider if in the yellow zone for 48 hours without improvement Discussed the importance of adequate rest and management of fatigue with Asthma Assessed social determinant of health barriers  Collaborated with SW SW referral for anxiety/depression/panic attacks-completed  Patient Goals/Self-Care Activities: Take all medications as prescribed Attend all scheduled provider appointments Call pharmacy for medication refills 3-7 days in advance of running out of medications Perform all self care activities independently  Perform IADL's (shopping, preparing meals, housekeeping, managing finances) independently Call provider office for new concerns or questions  Work with the social worker to address care coordination needs and will continue to work with the clinical team to address health care and disease management related needs Patient to contact Pharmacy and/or Provider regarding medication  Follow Up Plan:  The patient has been provided with contact information for the care management team and has been advised to call with any health related questions or concerns.  The care management team will reach out to the patient again over the next 30 business  days.

## 2022-08-31 NOTE — Telephone Encounter (Signed)
Patient called triage line stating that when her cycles are about to come on she is having discomfort in her chest and hot flashes

## 2022-08-31 NOTE — Patient Outreach (Signed)
Medicaid Managed Care   Nurse Care Manager Note  08/31/2022 Name:  Jasmine Gordon MRN:  [id number] DOB:  03-14-1986  Jasmine Gordon is an 36 y.o. year old female who is a primary patient of Jasmine Hauser, DO.  The Jasmine Gordon Managed Care Coordination team was consulted for assistance with:    Chronic healthcare management needs, asthma, anxiety, depression, HLD, PTSD, panic attacks  [name] was given information about Medicaid Managed Care Coordination team services today. [place] Patient agreed to services and verbal consent obtained.  Engaged with patient by telephone for follow up visit in response to provider referral for case management and/or care coordination services.   Assessments/Interventions:  Review of past medical history, allergies, medications, health status, including review of consultants reports, laboratory and other test data, was performed as part of comprehensive evaluation and provision of chronic care management services.  SDOH (Social Determinants of Health) assessments and interventions performed: SDOH Interventions    Flowsheet Row Patient Outreach Telephone from 08/31/2022 in [place] Patient Outreach Telephone from 08/17/2022 in [place] Patient Outreach Telephone from 07/27/2022 in [place] Patient Outreach Telephone from 07/13/2022 in [facility] Office Visit from 07/12/2022 in [place] Patient Outreach Telephone from 06/29/2022 in [place] Interventions        Housing Interventions Intervention Not Indicated -- -- -- -- --  Transportation Interventions -- -- Intervention Not Indicated -- -- --  Utilities Interventions -- -- Intervention Not Indicated -- -- --  Depression  Interventions/Treatment  -- -- -- -- Medication --  Financial Strain Interventions -- -- -- Intervention Not Indicated -- --  Stress Interventions -- Provide Counseling, [place], Live Life Well  Social Connections Interventions -- -- -- Intervention Not Indicated -- --      Care Plan  Allergies  Allergen Reactions   Reglan [Metoclopramide] Hives and Shortness Of Breath   Contrave [Naltrexone-Bupropion Hcl Er] Other (See Comments)    Had some reaction to the medication    Medications Reviewed Today     Reviewed by Gayla Medicus, RN (Registered Nurse) on 08/31/22 at [id number]  Med List Status: <None>   Medication Order Taking? Sig Documenting Provider Last Dose Status Informant  albuterol (VENTOLIN HFA) 108 (90 Base) MCG/ACT inhaler [id number] No Inhale 2 puffs into the lungs every 6 (six) hours as needed for wheezing or shortness of breath. Jasmine Hauser, DO Taking Active   cetirizine (ZYRTEC) 10 MG tablet [id number] No Take 10 mg by mouth daily as needed for allergies. [provider] Taking Active   chlorhexidine (PERIDEX) 0.12 % solution [id number] No Use as directed 10 mLs in the mouth or throat. [provider] Taking Active   escitalopram (LEXAPRO) 10 MG tablet [id number]  5 mg daily for one week, then 10 mg daily Hisada, Elie Goody, MD  Active   fluticasone (FLONASE) 50 MCG/ACT nasal spray [id number] No Place 1 spray into both nostrils daily. Use for 4-6 weeks then stop and use seasonally or as needed. Jearld Fenton, NP Taking Active   hydrOXYzine (ATARAX) 10 MG tablet [id number] No Take 1 tablet (10 mg total) by mouth 3 (three) times daily as needed for anxiety. Jasmine Hauser, DO Taking Active   metoprolol tartrate (LOPRESSOR) 25 MG tablet [id number] No Take 0.5  tablets (12.5 mg total) by mouth 2 (two) times daily. Furth, Cadence H, PA-C Taking Expired 08/16/22 2359   montelukast  (SINGULAIR) 10 MG tablet [id number] No Take 10 mg by mouth at bedtime as needed. [provider] Taking Active   Multiple Vitamins-Minerals (ONE-A-DAY WOMENS PO) [id number] No Take 1 tablet by mouth daily. [provider] Taking Active   OVER THE COUNTER MEDICATION [id number] No Goli ashwaganda gummy 1/2 daily [provider] Taking Active   pantoprazole (PROTONIX) 40 MG tablet [id number] No Take 1 tablet (40 mg total) by mouth daily. Fransico Michael, Cadence H, PA-C Taking Active   Spacer/Aero-Holding Chambers (AEROCHAMBER MV) inhaler [id number] No Use as instructed [name], DO Taking Active   Vitamin D, Ergocalciferol, (DRISDOL) 1.25 MG (50000 UNIT) CAPS capsule [id number] No Take 1 capsule (50,000 Units total) by mouth every 7 (seven) days. [name], DO Taking Active            Patient Active Problem List   Diagnosis Date Noted   Vitamin B12 deficiency 06/09/2022   Palpitations 01/21/2022   Shortness of breath 01/21/2022   Chest pain 01/21/2022   Generalized anxiety disorder with panic attacks 09/27/2021   Environmental and seasonal allergies 11/04/2020   Genital warts 09/11/2018   Positive TB test 09/25/2017   Allergic contact dermatitis due to metals 05/17/2017   Pre-diabetes 03/06/2017   Hyperlipidemia 03/06/2017   Allergic reaction 02/08/2017   Urticaria due to food allergy 02/08/2017   Vitamin D deficiency 11/08/2016   Iron deficiency anemia due to chronic blood loss 10/03/2016   Menorrhagia with regular cycle 06/27/2016   Mild persistent asthma without complication 06/17/2016   Anxiety and depression 06/17/2016   Morbid obesity (HCC) 06/17/2016   PTSD (post-traumatic stress disorder) 2016   Conditions to be addressed/monitored per PCP order:  Chronic healthcare management needs, asthma, anxiety, depression, HLD, PTSD, panic attacks  Care Plan : RN Care Manager Plan of Care  Updates made by [name], RN since 09/18/2022  12:00 AM     Problem: Health Promotion or Disease Self-Management (General Plan of Care)      Long-Range Goal: Chronic Disease Management and Care Coordination Needs   Expected End Date: 10/26/2022  Priority: High  Note:   Current Barriers:  Knowledge Deficits related to plan of care for management of Asthma, anxiety  Chronic Disease Management support and education needs related to Asthma, anxiety 2022-09-18:  Patient's Brother died last week-discussed grief counseling-continues to see Theme park manager.  Currently not working-was let go from [employer] and is looking for a new job-considering temp agency and will visit them today for job opportunities.  No breathing difficulties at the moment.  RNCM Clinical Goal(s):  Patient will verbalize understanding of plan for management of Asthma, anxiety as evidenced by patient report verbalize basic understanding of  Asthma disease process and self health management plan as evidenced by patient report take all medications exactly as prescribed and will call provider for medication related questions as evidenced by patient report demonstrate understanding of rationale for each prescribed medication as evidenced by patient report attend all scheduled medical appointments: as evidenced by patient report continue to work with RN Care Manager to address care management and care coordination needs related to  Asthma as evidenced by adherence to CM Team Scheduled appointments work with social worker to address  related to the management of Mental Health Concerns  related to the management of Anxiety, Depression, and PTSD, panic attacks as evidenced  by review of EMR and patient or social worker report experience decrease in ED visits as evidenced by EMR review.  ED visits in in last 6 months = 13 through collaboration with RN Care manager, provider, and care team-ED visit 7/18 for fatigue, insomnia  Interventions: Inter-disciplinary care team  collaboration (see longitudinal plan of care) Evaluation of current treatment plan related to  self management and patient's adherence to plan as established by provider  Asthma: (Status:New goal.) Long Term Goal Advised patient to track and manage Asthma triggers Advised patient to self assesses Asthma action plan zone and make appointment with provider if in the yellow zone for 48 hours without improvement Discussed the importance of adequate rest and management of fatigue with Asthma Assessed social determinant of health barriers  Collaborated with SW SW referral for anxiety/depression/panic attacks-completed  Patient Goals/Self-Care Activities: Take all medications as prescribed Attend all scheduled provider appointments Call pharmacy for medication refills 3-7 days in advance of running out of medications Perform all self care activities independently  Perform IADL's (shopping, preparing meals, housekeeping, managing finances) independently Call provider office for new concerns or questions  Work with the social worker to address care coordination needs and will continue to work with the clinical team to address health care and disease management related needs Patient to contact Pharmacy and/or Provider regarding medication  Follow Up Plan:  The patient has been provided with contact information for the care management team and has been advised to call with any health related questions or concerns.  The care management team will reach out to the patient again over the next 30 business  days.    Long-Range Goal: Establish Plan of Care for Chronic Disease Management Needs   Priority: High  Note:   Timeframe:  Long-Range Goal Priority:  High Start Date:   03/07/22                          Expected End Date:     ongoing                  Follow Up Date 09/30/22   - schedule appointment for flu shot - schedule appointment for vaccines needed due to my age or health - schedule  recommended health tests (blood work, mammogram, colonoscopy, pap test) - schedule and keep appointment for annual check-up    Why is this important?   Screening tests can find diseases early when they are easier to treat.  Your doctor or nurse will talk with you about which tests are important for you.  Getting shots for common diseases like the flu and shingles will help prevent them.  08/31/22:  patient has appt with Psychiatrist next week and still sees counselor   Follow Up:  Patient agrees to Care Plan and Follow-up.  Plan: The Managed Medicaid care management team will reach out to the patient again over the next 30 business  days. and The  Patient has been provided with contact information for the Managed Medicaid care management team and has been advised to call with any health related questions or concerns.  Date/time of next scheduled RN care management/care coordination outreach: 09/29/22 at 315.
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
## 2022-09-05 ENCOUNTER — Ambulatory Visit: Payer: Medicaid Other | Admitting: Psychiatry
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
## 2022-09-07 ENCOUNTER — Encounter: Payer: Self-pay | Admitting: Family Medicine

## 2022-09-07 ENCOUNTER — Telehealth (INDEPENDENT_AMBULATORY_CARE_PROVIDER_SITE_OTHER): Payer: Medicaid Other | Admitting: Psychiatry

## 2022-09-07 ENCOUNTER — Other Ambulatory Visit: Payer: Self-pay

## 2022-09-07 ENCOUNTER — Encounter: Payer: Self-pay | Admitting: Psychiatry

## 2022-09-07 DIAGNOSIS — F410 Panic disorder [episodic paroxysmal anxiety] without agoraphobia: Secondary | ICD-10-CM

## 2022-09-07 DIAGNOSIS — F331 Major depressive disorder, recurrent, moderate: Secondary | ICD-10-CM | POA: Diagnosis not present

## 2022-09-07 DIAGNOSIS — G4700 Insomnia, unspecified: Secondary | ICD-10-CM

## 2022-09-07 DIAGNOSIS — F4310 Post-traumatic stress disorder, unspecified: Secondary | ICD-10-CM

## 2022-09-07 DIAGNOSIS — F411 Generalized anxiety disorder: Secondary | ICD-10-CM | POA: Diagnosis not present

## 2022-09-07 DIAGNOSIS — J4530 Mild persistent asthma, uncomplicated: Secondary | ICD-10-CM

## 2022-09-07 MED ORDER — ESCITALOPRAM OXALATE 10 MG PO TABS: ORAL_TABLET | ORAL | 0 refills | Status: DC

## 2022-09-07 MED ORDER — ALBUTEROL SULFATE HFA 108 (90 BASE) MCG/ACT IN AERS: 2.0000 | INHALATION_SPRAY | Freq: Four times a day (QID) | RESPIRATORY_TRACT | 3 refills | Status: DC | PRN

## 2022-09-07 NOTE — Patient Instructions (Signed)
Start lexapro 5 mg daily for one week  Discontinue fluoxetine  Continue hydroxyzine 25 mg daily as needed for anxiety  Referral for evaluation of sleep apnea Next appointment: 11/15 at 2:30

## 2022-09-08 ENCOUNTER — Encounter: Payer: Self-pay | Admitting: Emergency Medicine

## 2022-09-08 ENCOUNTER — Other Ambulatory Visit: Payer: Self-pay

## 2022-09-08 DIAGNOSIS — F419 Anxiety disorder, unspecified: Secondary | ICD-10-CM | POA: Insufficient documentation

## 2022-09-08 DIAGNOSIS — R109 Unspecified abdominal pain: Secondary | ICD-10-CM | POA: Diagnosis not present

## 2022-09-08 DIAGNOSIS — M791 Myalgia, unspecified site: Secondary | ICD-10-CM | POA: Insufficient documentation

## 2022-09-08 LAB — URINALYSIS, ROUTINE W REFLEX MICROSCOPIC
Bacteria, UA: NONE SEEN
Bilirubin Urine: NEGATIVE
Glucose, UA: NEGATIVE mg/dL
Ketones, ur: NEGATIVE mg/dL
Leukocytes,Ua: NEGATIVE
Nitrite: NEGATIVE
Protein, ur: NEGATIVE mg/dL
Specific Gravity, Urine: 1.018 (ref 1.005–1.030)
pH: 5 (ref 5.0–8.0)

## 2022-09-08 LAB — CBC WITH DIFFERENTIAL/PLATELET
Abs Immature Granulocytes: 0.01 10*3/uL (ref 0.00–0.07)
Basophils Absolute: 0 10*3/uL (ref 0.0–0.1)
Basophils Relative: 0 %
Eosinophils Absolute: 0.1 10*3/uL (ref 0.0–0.5)
Eosinophils Relative: 2 %
HCT: 37.5 % (ref 36.0–46.0)
Hemoglobin: 11.4 g/dL — ABNORMAL LOW (ref 12.0–15.0)
Immature Granulocytes: 0 %
Lymphocytes Relative: 43 %
Lymphs Abs: 2.5 10*3/uL (ref 0.7–4.0)
MCH: 25.9 pg — ABNORMAL LOW (ref 26.0–34.0)
MCHC: 30.4 g/dL (ref 30.0–36.0)
MCV: 85 fL (ref 80.0–100.0)
Monocytes Absolute: 0.4 10*3/uL (ref 0.1–1.0)
Monocytes Relative: 6 %
Neutro Abs: 2.9 10*3/uL (ref 1.7–7.7)
Neutrophils Relative %: 49 %
Platelets: 243 10*3/uL (ref 150–400)
RBC: 4.41 MIL/uL (ref 3.87–5.11)
RDW: 13.1 % (ref 11.5–15.5)
WBC: 5.9 10*3/uL (ref 4.0–10.5)
nRBC: 0 % (ref 0.0–0.2)

## 2022-09-08 LAB — POC URINE PREG, ED: Preg Test, Ur: NEGATIVE

## 2022-09-08 NOTE — ED Triage Notes (Signed)
"  Everytime I try to lay down my whole body is hurting"  Abdominal pain as well. Nausea with no vomiting.  Has been going on all week. Did not receive  message back from her PCP.  No fevers.

## 2022-09-09 ENCOUNTER — Emergency Department
Admission: EM | Admit: 2022-09-09 | Discharge: 2022-09-09 | Disposition: A | Discharge status: Home / Self Care | Payer: Medicaid Other | Attending: Emergency Medicine | Admitting: Emergency Medicine

## 2022-09-09 DIAGNOSIS — F419 Anxiety disorder, unspecified: Secondary | ICD-10-CM

## 2022-09-09 DIAGNOSIS — R109 Unspecified abdominal pain: Secondary | ICD-10-CM

## 2022-09-09 DIAGNOSIS — M791 Myalgia, unspecified site: Secondary | ICD-10-CM

## 2022-09-09 LAB — COMPREHENSIVE METABOLIC PANEL
ALT: 10 U/L (ref 0–44)
AST: 18 U/L (ref 15–41)
Albumin: 4.1 g/dL (ref 3.5–5.0)
Alkaline Phosphatase: 55 U/L (ref 38–126)
Anion gap: 7 (ref 5–15)
BUN: 12 mg/dL (ref 6–20)
CO2: 24 mmol/L (ref 22–32)
Calcium: 9.4 mg/dL (ref 8.9–10.3)
Chloride: 109 mmol/L (ref 98–111)
Creatinine, Ser: 0.77 mg/dL (ref 0.44–1.00)
GFR, Estimated: >60 mL/min (ref >60)
Glucose, Bld: 105 mg/dL — ABNORMAL HIGH (ref 70–99)
Potassium: 3.5 mmol/L (ref 3.5–5.1)
Sodium: 140 mmol/L (ref 135–145)
Total Bilirubin: 0.3 mg/dL (ref 0.3–1.2)
Total Protein: 7.3 g/dL (ref 6.5–8.1)

## 2022-09-09 LAB — TROPONIN I (HIGH SENSITIVITY)
Troponin I (High Sensitivity): <2 ng/L (ref <18)
Troponin I (High Sensitivity): <2 ng/L (ref <18)

## 2022-09-09 LAB — LIPASE, BLOOD: Lipase: 31 U/L (ref 11–51)

## 2022-09-09 MED ORDER — DICYCLOMINE HCL 10 MG PO CAPS
20.0000 mg | ORAL_CAPSULE | Freq: Once | ORAL | Status: AC
Start: 2022-09-09 — End: 2022-09-09
  Administered 2022-09-09: 20 mg via ORAL
  Filled 2022-09-09: qty 2

## 2022-09-09 MED ORDER — DICYCLOMINE HCL 10 MG PO CAPS: 10.0000 mg | ORAL_CAPSULE | Freq: Three times a day (TID) | ORAL | 1 refills | Status: DC

## 2022-09-09 NOTE — Discharge Instructions (Signed)
Try using the Bentyl medication alongside the hydroxyzine.

## 2022-09-09 NOTE — ED Provider Notes (Signed)
[facility] Provider Note    Event Date/Time   First MD Initiated Contact with Patient 09/09/22 0208     (approximate)   History   Generalized Body Aches   HPI  Jasmine Gordon is a 36 y.o. female who presents to the ED for evaluation of Generalized Body Aches   Patient presents to the ED for evaluation of myalgias, nausea, abdominal cramping, anxiety and poor sleep.  She reports multiple deaths in her family in the past couple months and significant home psychosocial stressors.   Physical Exam   Triage Vital Signs: ED Triage Vitals  Enc Vitals Group     BP 09/08/22 2253 116/77     Pulse Rate 09/08/22 2253 (!) 103     Resp 09/08/22 2253 18     Temp 09/08/22 2253 98 F (36.7 C)     Temp Source 09/08/22 2253 Oral     SpO2 09/08/22 2253 100 %     Weight --      Height --      Head Circumference --      Peak Flow --      Pain Score 09/08/22 2252 7     Pain Loc --      Pain Edu? --      Excl. in [place]? --     Most recent vital signs: Vitals:   09/09/22 0138 09/09/22 0249  BP: 122/74 128/70  Pulse: 87 82  Resp: 19 18  Temp: 98.2 F (36.8 C) 98 F (36.7 C)  SpO2: 100% 99%    General: Awake, no distress.  CV:  Good peripheral perfusion.  Resp:  Normal effort.  Abd:  No distention.  MSK:  No deformity noted.  Neuro:  No focal deficits appreciated. Other:     ED Results / Procedures / Treatments   Labs (all labs ordered are listed, but only abnormal results are displayed) Labs Reviewed  URINALYSIS, ROUTINE W REFLEX MICROSCOPIC - Abnormal; Notable for the following components:      Result Value   Color, Urine YELLOW (*)    APPearance HAZY (*)    Hgb urine dipstick SMALL (*)    All other components within normal limits  CBC WITH DIFFERENTIAL/PLATELET - Abnormal; Notable for the following components:   Hemoglobin 11.4 (*)    MCH 25.9 (*)    All other components within normal limits  COMPREHENSIVE METABOLIC PANEL - Abnormal;  Notable for the following components:   Glucose, Bld 105 (*)    All other components within normal limits  LIPASE, BLOOD  POC URINE PREG, ED  TROPONIN I (HIGH SENSITIVITY)  TROPONIN I (HIGH SENSITIVITY)    EKG Sinus rhythm with a rate of 94 bpm.  Normal axis.  QTc 472.  No clear signs of acute ischemia.  Nonspecific ST changes to lead III.  RADIOLOGY   Official radiology report(s): No results found.  PROCEDURES and INTERVENTIONS:  .1-3 Lead EKG Interpretation  Performed by: Vladimir Crofts, MD Authorized by: Vladimir Crofts, MD     Interpretation: normal     ECG rate:  80   ECG rate assessment: normal     Rhythm: sinus rhythm     Ectopy: none     Conduction: normal     Medications  dicyclomine (BENTYL) capsule 20 mg (20 mg Oral Given 09/09/22 0253)     IMPRESSION / MDM / ASSESSMENT AND PLAN / ED COURSE  I reviewed the triage vital signs and the nursing notes.  Differential diagnosis includes, but is not limited to, anxiety, pancreatitis, UTI, sepsis, ACS  {Patient presents with symptoms of an acute illness or injury that is potentially life-threatening.  Patient presents with various atypical symptoms, possibly due to anxiety and stress or IBS, but ultimately suitable for outpatient management.  She looks systemically well.  Has benign examination.  Benign work-up with nonischemic EKG, negative troponin x2, normal lipase, CMP and CBC.  UA without infectious features.  Improving symptoms with Bentyl.  Discussed coping mechanisms and management at home as well as appropriate return precautions for the ED.      FINAL CLINICAL IMPRESSION(S) / ED DIAGNOSES   Final diagnoses:  Myalgia  Abdominal cramping  Anxiety     Rx / DC Orders   ED Discharge Orders          Ordered    dicyclomine (BENTYL) 10 MG capsule  3 times daily before meals & bedtime        09/09/22 0244             Note:  This document was prepared using Dragon voice recognition software and  may include unintentional dictation errors.   Delton Prairie, MD 09/09/22 [phone]

## 2022-09-16 ENCOUNTER — Other Ambulatory Visit: Payer: Self-pay

## 2022-09-16 ENCOUNTER — Ambulatory Visit (INDEPENDENT_AMBULATORY_CARE_PROVIDER_SITE_OTHER): Payer: Medicaid Other | Admitting: Family Medicine

## 2022-09-16 ENCOUNTER — Encounter: Payer: Self-pay | Admitting: Family Medicine

## 2022-09-16 VITALS — BP 125/85 | HR 98 | Ht 67.0 in | Wt 249.2 lb

## 2022-09-16 DIAGNOSIS — E559 Vitamin D deficiency, unspecified: Secondary | ICD-10-CM | POA: Diagnosis not present

## 2022-09-16 DIAGNOSIS — F419 Anxiety disorder, unspecified: Secondary | ICD-10-CM

## 2022-09-16 DIAGNOSIS — R7303 Prediabetes: Secondary | ICD-10-CM

## 2022-09-16 DIAGNOSIS — E7800 Pure hypercholesterolemia, unspecified: Secondary | ICD-10-CM

## 2022-09-16 DIAGNOSIS — F32A Depression, unspecified: Secondary | ICD-10-CM

## 2022-09-16 DIAGNOSIS — Z0000 Encounter for general adult medical examination without abnormal findings: Secondary | ICD-10-CM

## 2022-09-16 NOTE — Patient Instructions (Addendum)
Thank you for coming to the office today.  Stay tuned for lab results.  Follow up with Psychiatry if need additional support right now with this tough time.  Prior A1c trend 5.5 to 6.1, we will see where it is now and follow up on it.  Non fasting labs today  We can do the Advanced Lipid panel Lipoprotein testing anytime you are ready, prefer at least 3 months after this cholesterol panel and fasting.   Please schedule a Follow-up Appointment to: Return in about 6 months (around 03/18/2023) for 6 month follow-up PreDM A1c, Weight, Anemia/Anxiety.  If you have any other questions or concerns, please feel free to call the office or send a message through [place]. You may also schedule an earlier appointment if necessary.  Additionally, you may be receiving a survey about your experience at our office within a few days to 1 week by e-mail or mail. We value your feedback.  Nobie Putnam, DO [place]

## 2022-09-16 NOTE — Patient Instructions (Signed)
Visit Information  [name] was given information about Medicaid Managed Care team care coordination services as a part of their Healthy [facility] Medicaid benefit. Jasmine Gordon verbally consented to engagement with the [facility] Managed Care team.   If you are experiencing a medical emergency, please call 911 or report to your local emergency department or urgent care.   If you have a non-emergency medical problem during routine business hours, please contact your provider's office and ask to speak with a nurse.   For questions related to your Healthy [facility] health plan, please call: [phone] or visit the homepage here: [email or web address]  If you would like to schedule transportation through your Healthy [facility] plan, please call the following number at least 2 days in advance of your appointment: [phone]  For information about your ride after you set it up, call Ride Assist at [phone]. Use this number to activate a Will Call pickup, or if your transportation is late for a scheduled pickup. Use this number, too, if you need to make a change or cancel a previously scheduled reservation.  If you need transportation services right away, call [phone]. The after-hours call center is staffed 24 hours to handle ride assistance and urgent reservation requests (including discharges) 365 days a year. Urgent trips include sick visits, hospital discharge requests and life-sustaining treatment.  Call the [place] at [phone], at any time, 24 hours a day, 7 days a week. If you are in danger or need immediate medical attention call 911.  If you would like help to quit smoking, call 1-800-QUIT-NOW 407-382-2090) OR Espaol: 1-855-Djelo-Ya (4-917-915-0569) o para ms informacin haga clic aqu or Text READY to 200-400 to register via text  [name] following are the goals we discussed in your visit  today:   Goals Addressed   None      Social Worker will follow up in 6 days .   Jasmine Gordon, BSW, [place] Managed Medicaid Team  [phone]   Following is a copy of your plan of care:  There are no care plans that you recently modified to display for this patient.
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
## 2022-09-17 LAB — COMPLETE METABOLIC PANEL WITH GFR
AG Ratio: 1.5 (calc) (ref 1.0–2.5)
ALT: 7 U/L (ref 6–29)
AST: 13 U/L (ref 10–30)
Albumin: 4.2 g/dL (ref 3.6–5.1)
Alkaline phosphatase (APISO): 43 U/L (ref 31–125)
BUN: 11 mg/dL (ref 7–25)
CO2: 24 mmol/L (ref 20–32)
Calcium: 9.6 mg/dL (ref 8.6–10.2)
Chloride: 105 mmol/L (ref 98–110)
Creat: 0.73 mg/dL (ref 0.50–0.97)
Globulin: 2.8 g/dL (calc) (ref 1.9–3.7)
Glucose, Bld: 85 mg/dL (ref 65–139)
Potassium: 4.1 mmol/L (ref 3.5–5.3)
Sodium: 138 mmol/L (ref 135–146)
Total Bilirubin: 0.3 mg/dL (ref 0.2–1.2)
Total Protein: 7 g/dL (ref 6.1–8.1)
eGFR: 111 mL/min/{1.73_m2} (ref >=60)

## 2022-09-17 LAB — VITAMIN D 25 HYDROXY (VIT D DEFICIENCY, FRACTURES): Vit D, 25-Hydroxy: 18 ng/mL — ABNORMAL LOW (ref 30–100)

## 2022-09-17 LAB — LIPID PANEL
Cholesterol: 210 mg/dL — ABNORMAL HIGH (ref <200)
HDL: 50 mg/dL (ref >=50)
LDL Cholesterol (Calc): 136 mg/dL (calc) — ABNORMAL HIGH
Non-HDL Cholesterol (Calc): 160 mg/dL (calc) — ABNORMAL HIGH (ref <130)
Total CHOL/HDL Ratio: 4.2 (calc) (ref <5.0)
Triglycerides: 119 mg/dL (ref <150)

## 2022-09-17 LAB — TSH: TSH: 2.25 mIU/L

## 2022-09-17 LAB — HEMOGLOBIN A1C
Hgb A1c MFr Bld: 5.9 % of total Hgb — ABNORMAL HIGH (ref <5.7)
Mean Plasma Glucose: 123 mg/dL
eAG (mmol/L): 6.8 mmol/L

## 2022-09-29 ENCOUNTER — Other Ambulatory Visit: Payer: Self-pay | Admitting: Obstetrics and Gynecology

## 2022-09-29 ENCOUNTER — Encounter: Payer: Self-pay | Admitting: Obstetrics and Gynecology

## 2022-09-29 NOTE — Patient Instructions (Signed)
Hi [name], glad you are doing well-have a nice afternoon!  [name] was given information about Medicaid Managed Care team care coordination services as a part of their Healthy [facility] benefit. Jasmine Gordon verbally consented to engagement with the [facility] Managed Care team.   If you are experiencing a medical emergency, please call 911 or report to your local emergency department or urgent care.   If you have a non-emergency medical problem during routine business hours, please contact your provider's office and ask to speak with a nurse.   For questions related to your Healthy [facility] health plan, please call: [phone] or visit the homepage here: [email or web address]  If you would like to schedule transportation through your Healthy [facility] plan, please call the following number at least 2 days in advance of your appointment: [phone]  For information about your ride after you set it up, call Ride Assist at [phone]. Use this number to activate a Will Call pickup, or if your transportation is late for a scheduled pickup. Use this number, too, if you need to make a change or cancel a previously scheduled reservation.  If you need transportation services right away, call [phone]. The after-hours call center is staffed 24 hours to handle ride assistance and urgent reservation requests (including discharges) 365 days a year. Urgent trips include sick visits, hospital discharge requests and life-sustaining treatment.  Call the [facility] Line at [phone], at any time, 24 hours a day, 7 days a week. If you are in danger or need immediate medical attention call 911.  If you would like help to quit smoking, call 1-800-QUIT-NOW (304 583 6926) OR Espaol: 1-855-Djelo-Ya (6-144-315-4008) o para ms informacin haga clic aqu or Text READY to 676-195 to register via text  [name]  following are the goals we discussed in your visit today:   Goals Addressed    Timeframe:  Long-Range Goal Priority:  High Start Date:   03/07/22                          Expected End Date:     ongoing                  Follow Up Date 11/01/22   - schedule appointment for flu shot - schedule appointment for vaccines needed due to my age or health - schedule recommended health tests (blood work, mammogram, colonoscopy, pap test) - schedule and keep appointment for annual check-up    Why is this important?   Screening tests can find diseases early when they are easier to treat.  Your doctor or nurse will talk with you about which tests are important for you.  Getting shots for common diseases like the flu and shingles will help prevent them.  09/29/22:  Has GYN appt 11/14, recent PCP appt 10/27, continues therapy  Patient verbalizes understanding of instructions and care plan provided today and agrees to view in MyChart. Active MyChart status and patient understanding of how to access instructions and care plan via MyChart confirmed with patient.     The Managed Medicaid care management team will reach out to the patient again over the next 30 business  days.  The  Patient  has been provided with contact information for the Managed Medicaid care management team and has been advised to call with any health related questions or concerns.   [name] RN, BSN [employer]  [employer]  Care Management Coordinator - Managed Medicaid High Risk [phone]   Following is a copy of your plan of care:  Care Plan : RN Care Manager Plan of Care  Updates made by [name], RN since 09/29/2022 12:00 AM     Problem: Health Promotion or Disease Self-Management (General Plan of Care)      Long-Range Goal: Chronic Disease Management and Care Coordination Needs   Expected End Date: 12/30/2022  Priority: High  Note:   Current Barriers:  Knowledge Deficits related to plan of care  for management of Asthma, anxiety  Chronic Disease Management support and education needs related to Asthma, anxiety 09/29/22:  patient has started new job, breathing OK. Continues therapy services-ni complaints/issues today.  RNCM Clinical Goal(s):  Patient will verbalize understanding of plan for management of Asthma, anxiety as evidenced by patient report verbalize basic understanding of  Asthma disease process and self health management plan as evidenced by patient report take all medications exactly as prescribed and will call provider for medication related questions as evidenced by patient report demonstrate understanding of rationale for each prescribed medication as evidenced by patient report attend all scheduled medical appointments: as evidenced by patient report continue to work with RN Care Manager to address care management and care coordination needs related to  Asthma as evidenced by adherence to CM Team Scheduled appointments work with Child psychotherapist to address  related to the management of Mental Health Concerns  related to the management of Anxiety, Depression, and PTSD, panic attacks as evidenced by review of EMR and patient or social worker report  Interventions: Inter-disciplinary care team collaboration (see longitudinal plan of care) Evaluation of current treatment plan related to  self management and patient's adherence to plan as established by provider  Asthma: (Status:New goal.) Long Term Goal Advised patient to track and manage Asthma triggers Advised patient to self assesses Asthma action plan zone and make appointment with provider if in the yellow zone for 48 hours without improvement Discussed the importance of adequate rest and management of fatigue with Asthma Assessed social determinant of health barriers  Collaborated with SW SW referral for anxiety/depression/panic attacks-completed  Patient Goals/Self-Care Activities: Take all medications as  prescribed Attend all scheduled provider appointments Call pharmacy for medication refills 3-7 days in advance of running out of medications Perform all self care activities independently  Perform IADL's (shopping, preparing meals, housekeeping, managing finances) independently Call provider office for new concerns or questions  Work with the social worker to address care coordination needs and will continue to work with the clinical team to address health care and disease management related needs Patient to contact Pharmacy and/or Provider regarding medication  Follow Up Plan:  The patient has been provided with contact information for the care management team and has been advised to call with any health related questions or concerns.  The care management team will reach out to the patient again over the next 30 business  days.

## 2022-09-29 NOTE — Patient Outreach (Signed)
Medicaid Managed Care   Nurse Care Manager Note  09/29/2022 Name:  Jasmine Gordon MRN:  [id number] DOB:  Apr 07, 1986  Jasmine Gordon is an 36 y.o. year old female who is a primary patient of [name], DO.  The [facility] Managed Care Coordination team was consulted for assistance with:    Chronic healthcare management needs, asthma, anxiety/depression/PTSD, HLD, panic attacks  Jasmine Gordon was given information about Medicaid Managed Care Coordination team services today. Jasmine Gordon Patient agreed to services and verbal consent obtained.  Engaged with patient by telephone for follow up visit in response to provider referral for case management and/or care coordination services.   Assessments/Interventions:  Review of past medical history, allergies, medications, health status, including review of consultants reports, laboratory and other test data, was performed as part of comprehensive evaluation and provision of chronic care management services.  SDOH (Social Determinants of Health) assessments and interventions performed: SDOH Interventions    Flowsheet Row Patient Outreach Telephone from 09/29/2022 in [place] [facility] Office Visit from 09/16/2022 in [facility] Patient Outreach Telephone from 08/31/2022 in [facility] Care Coordination Patient Outreach Telephone from 08/17/2022 in [facility] Care Coordination Patient Outreach Telephone from 07/27/2022 in [facility] Care Coordination Patient Outreach Telephone from 07/13/2022 in [facility] Care Coordination  SDOH Interventions        Food Insecurity Interventions Intervention Not Indicated -- -- -- -- --  Housing Interventions -- -- Intervention Not Indicated -- -- --  Transportation Interventions -- -- -- -- Intervention Not Indicated --  Utilities Interventions -- -- -- --  Intervention Not Indicated --  Alcohol Usage Interventions Intervention Not Indicated (Score <7) -- -- -- -- --  Depression Interventions/Treatment  -- Medication -- -- -- --  Financial Strain Interventions -- -- -- -- -- Intervention Not Indicated  Stress Interventions -- -- -- Provide Counseling, Offered [employer] -- --  Social Connections Interventions -- -- -- -- -- Intervention Not Indicated       Care Plan  Allergies  Allergen Reactions   Reglan [Metoclopramide] Hives and Shortness Of Breath   Contrave [Naltrexone-Bupropion Hcl Er] Other (See Comments)    Had some reaction to the medication   Medications Reviewed Today     Reviewed by [name], RN (Registered Nurse) on 09/29/22 at 1316  Med List Status: <None>   Medication Order Taking? Sig Documenting Provider Last Dose Status Informant  albuterol (VENTOLIN HFA) 108 (90 Base) MCG/ACT inhaler [id number] No Inhale 2 puffs into the lungs every 6 (six) hours as needed for wheezing or shortness of breath. [name], DO Taking Active   cetirizine (ZYRTEC) 10 MG tablet [id number] No Take 10 mg by mouth daily as needed for allergies. [provider] Taking Active   chlorhexidine (PERIDEX) 0.12 % solution [id number] No Use as directed 10 mLs in the mouth or throat. [provider] Taking Active   dicyclomine (BENTYL) 10 MG capsule [id number] No Take 1 capsule (10 mg total) by mouth 4 (four) times daily -  before meals and at bedtime. Delton Prairie, MD Taking Active   escitalopram (LEXAPRO) 10 MG tablet [id number] No 5 mg daily for one week, then 10 mg daily Hisada, Barbee Cough, MD Taking Active   fluticasone (FLONASE) 50 MCG/ACT nasal spray [id number] No Place 1 spray into both nostrils daily. Use for 4-6 weeks then stop and use seasonally or as  needed. Lorre Munroe, NP Taking Active   hydrOXYzine (ATARAX) 10 MG tablet [id number] No Take 1 tablet (10 mg total) by mouth 3 (three) times daily as  needed for anxiety. [name], DO Taking Active   metoprolol tartrate (LOPRESSOR) 25 MG tablet [id number] No Take 0.5 tablets (12.5 mg total) by mouth 2 (two) times daily. Furth, Cadence H, PA-C Taking Expired 08/16/22 2359   montelukast (SINGULAIR) 10 MG tablet [id number] No Take 10 mg by mouth at bedtime as needed. [provider] Taking Active   Multiple Vitamins-Minerals (ONE-A-DAY WOMENS PO) [id number] No Take 1 tablet by mouth daily. [provider] Taking Active   OVER THE COUNTER MEDICATION [id number] No Goli ashwaganda gummy 1/2 daily [provider] Taking Active   pantoprazole (PROTONIX) 40 MG tablet [id number] No Take 1 tablet (40 mg total) by mouth daily. Fransico Michael, Cadence H, PA-C Taking Active   Spacer/Aero-Holding Chambers (AEROCHAMBER MV) inhaler [id number] No Use as instructed [name], DO Taking Active   Vitamin D, Ergocalciferol, (DRISDOL) 1.25 MG (50000 UNIT) CAPS capsule [id number] No Take 1 capsule (50,000 Units total) by mouth every 7 (seven) days. [name], DO Taking Active            Patient Active Problem List   Diagnosis Date Noted   Vitamin B12 deficiency 06/09/2022   Palpitations 01/21/2022   Shortness of breath 01/21/2022   Chest pain 01/21/2022   Generalized anxiety disorder with panic attacks 09/27/2021   Environmental and seasonal allergies 11/04/2020   Genital warts 09/11/2018   Positive TB test 09/25/2017   Allergic contact dermatitis due to metals 05/17/2017   Pre-diabetes 03/06/2017   Hyperlipidemia 03/06/2017   Allergic reaction 02/08/2017   Urticaria due to food allergy 02/08/2017   Vitamin D deficiency 11/08/2016   Iron deficiency anemia due to chronic blood loss 10/03/2016   Menorrhagia with regular cycle 06/27/2016   Mild persistent asthma without complication 06/17/2016   Anxiety and depression 06/17/2016   Morbid obesity (HCC) 06/17/2016   PTSD (post-traumatic stress  disorder) 2016   Conditions to be addressed/monitored per PCP order:  Chronic healthcare management needs, asthma, anxiety/depression/PTSD, HLD, panic attacks  Care Plan : RN Care Manager Plan of Care  Updates made by [name], RN since 09/29/2022 12:00 AM     Problem: Health Promotion or Disease Self-Management (General Plan of Care)      Long-Range Goal: Chronic Disease Management and Care Coordination Needs   Expected End Date: 12/30/2022  Priority: High  Note:   Current Barriers:  Knowledge Deficits related to plan of care for management of Asthma, anxiety  Chronic Disease Management support and education needs related to Asthma, anxiety 09/29/22:  patient has started new job, breathing OK. Continues therapy services-ni complaints/issues today.  RNCM Clinical Goal(s):  Patient will verbalize understanding of plan for management of Asthma, anxiety as evidenced by patient report verbalize basic understanding of  Asthma disease process and self health management plan as evidenced by patient report take all medications exactly as prescribed and will call provider for medication related questions as evidenced by patient report demonstrate understanding of rationale for each prescribed medication as evidenced by patient report attend all scheduled medical appointments: as evidenced by patient report continue to work with RN Care Manager to address care management and care coordination needs related to  Asthma as evidenced by adherence to CM Team Scheduled appointments work with social worker to address  related to the management of Mental Health  Concerns  related to the management of Anxiety, Depression, and PTSD, panic attacks as evidenced by review of EMR and patient or social worker report  Interventions: Inter-disciplinary care team collaboration (see longitudinal plan of care) Evaluation of current treatment plan related to  self management and patient's adherence to plan as  established by provider  Asthma: (Status:New goal.) Long Term Goal Advised patient to track and manage Asthma triggers Advised patient to self assesses Asthma action plan zone and make appointment with provider if in the yellow zone for 48 hours without improvement Discussed the importance of adequate rest and management of fatigue with Asthma Assessed social determinant of health barriers  Collaborated with SW SW referral for anxiety/depression/panic attacks-completed  Patient Goals/Self-Care Activities: Take all medications as prescribed Attend all scheduled provider appointments Call pharmacy for medication refills 3-7 days in advance of running out of medications Perform all self care activities independently  Perform IADL's (shopping, preparing meals, housekeeping, managing finances) independently Call provider office for new concerns or questions  Work with the social worker to address care coordination needs and will continue to work with the clinical team to address health care and disease management related needs Patient to contact Pharmacy and/or Provider regarding medication  Follow Up Plan:  The patient has been provided with contact information for the care management team and has been advised to call with any health related questions or concerns.  The care management team will reach out to the patient again over the next 30 business  days.    Long-Range Goal: Establish Plan of Care for Chronic Disease Management Needs   Priority: High  Note:   Timeframe:  Long-Range Goal Priority:  High Start Date:   03/07/22                          Expected End Date:     ongoing                  Follow Up Date 11/01/22   - schedule appointment for flu shot - schedule appointment for vaccines needed due to my age or health - schedule recommended health tests (blood work, mammogram, colonoscopy, pap test) - schedule and keep appointment for annual check-up    Why is this important?    Screening tests can find diseases early when they are easier to treat.  Your doctor or nurse will talk with you about which tests are important for you.  Getting shots for common diseases like the flu and shingles will help prevent them.  09/29/22:  Has GYN appt 11/14, recent PCP appt 10/27, continues therapy   Follow Up:  Patient agrees to Care Plan and Follow-up.  Plan: The Managed Medicaid care management team will reach out to the patient again over the next 30 business  days. and The  Patient has been provided with contact information for the Managed Medicaid care management team and has been advised to call with any health related questions or concerns.  Date/time of next scheduled RN care management/care coordination outreach: 11/01/22 at 315.
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
## 2022-10-04 ENCOUNTER — Ambulatory Visit (INDEPENDENT_AMBULATORY_CARE_PROVIDER_SITE_OTHER): Payer: Medicaid Other | Admitting: Obstetrics & Gynecology

## 2022-10-04 ENCOUNTER — Encounter: Payer: Self-pay | Admitting: Obstetrics & Gynecology

## 2022-10-04 VITALS — BP 124/76 | Ht 67.0 in | Wt 252.0 lb

## 2022-10-04 DIAGNOSIS — R232 Flushing: Secondary | ICD-10-CM

## 2022-10-04 DIAGNOSIS — E559 Vitamin D deficiency, unspecified: Secondary | ICD-10-CM | POA: Diagnosis not present

## 2022-10-04 NOTE — Progress Notes (Signed)
   Established Patient Office Visit  Subjective   Patient ID: Jasmine Gordon, female    DOB: 1986/09/01  Age: 36 y.o. MRN: [id number]  Chief Complaint  Patient presents with   Menstrual Problem    Had some spotting after her period Breast discomfort gone and hot flashes gone, she reports a lot of stress so maybe that's what was causing these symptoms.     HPI   36 yo married (P3- all sons) here because she had some hot flashes last month. These symptoms have resolved. She was under a massive amount of stress at that time and the stress level has improved at this time.  ROS- pap is up to date She uses condoms when she is at her most fertile, would be okay if she got pregnant.  Her fasting labs are up to date and we discussed her Vit D deficiency.   She works with mentally challenged adults.  Objective:     BP 124/76   Ht 5\' 7"  (1.702 m)   Wt 252 lb (114.3 kg)   LMP 09/25/2022   BMI 39.47 kg/m    Physical Exam   Well nourished, well hydrated Black female, no apparent distress Pelvic exam deferred  Assessment & Plan:  Vitamin D deficiency- she reports that she gets bad headaches whenever she takes her vit d so we brainstormed and she will plan on taking IBU and tylenol prior to her vit D dose.  I have rec'd MVIs daily in case she conceives in order to decrease risk of ONTDs.  I have reassured her about the transient, now resolved, hot flashes.  She declines a flu vaccine today.  Problem List Items Addressed This Visit   None   No follow-ups on file.    13/03/2022, MD

## 2022-10-05 ENCOUNTER — Encounter: Payer: Self-pay | Admitting: Psychiatry

## 2022-10-05 ENCOUNTER — Telehealth (INDEPENDENT_AMBULATORY_CARE_PROVIDER_SITE_OTHER): Payer: Medicaid Other | Admitting: Psychiatry

## 2022-10-05 DIAGNOSIS — F4310 Post-traumatic stress disorder, unspecified: Secondary | ICD-10-CM

## 2022-10-05 DIAGNOSIS — F411 Generalized anxiety disorder: Secondary | ICD-10-CM | POA: Diagnosis not present

## 2022-10-05 DIAGNOSIS — F410 Panic disorder [episodic paroxysmal anxiety] without agoraphobia: Secondary | ICD-10-CM

## 2022-10-05 DIAGNOSIS — F330 Major depressive disorder, recurrent, mild: Secondary | ICD-10-CM | POA: Diagnosis not present

## 2022-10-05 NOTE — Patient Instructions (Signed)
Start lexapro 5 mg daily for one week, then 10 mg daily  Continue hydroxyzine 25 mg daily as needed for anxiety  Next appointment: 1/16 at 4:30

## 2022-10-07 ENCOUNTER — Other Ambulatory Visit: Payer: Self-pay

## 2022-10-07 ENCOUNTER — Emergency Department
Admission: EM | Admit: 2022-10-07 | Discharge: 2022-10-07 | Disposition: A | Discharge status: Home / Self Care | Payer: Medicaid Other | Attending: Emergency Medicine | Admitting: Emergency Medicine

## 2022-10-07 ENCOUNTER — Encounter: Payer: Self-pay | Admitting: Emergency Medicine

## 2022-10-07 DIAGNOSIS — S29012A Strain of muscle and tendon of back wall of thorax, initial encounter: Secondary | ICD-10-CM | POA: Diagnosis not present

## 2022-10-07 DIAGNOSIS — J45909 Unspecified asthma, uncomplicated: Secondary | ICD-10-CM | POA: Diagnosis not present

## 2022-10-07 DIAGNOSIS — X501XXA Overexertion from prolonged static or awkward postures, initial encounter: Secondary | ICD-10-CM | POA: Diagnosis not present

## 2022-10-07 DIAGNOSIS — S29019A Strain of muscle and tendon of unspecified wall of thorax, initial encounter: Secondary | ICD-10-CM

## 2022-10-07 DIAGNOSIS — S24109A Unspecified injury at unspecified level of thoracic spinal cord, initial encounter: Secondary | ICD-10-CM | POA: Diagnosis present

## 2022-10-07 LAB — POC URINE PREG, ED: Preg Test, Ur: NEGATIVE

## 2022-10-07 MED ORDER — METHOCARBAMOL 500 MG PO TABS: ORAL_TABLET | ORAL | 0 refills | Status: DC

## 2022-10-07 MED ORDER — IBUPROFEN 800 MG PO TABS: 800.0000 mg | ORAL_TABLET | Freq: Three times a day (TID) | ORAL | 0 refills | Status: DC | PRN

## 2022-10-07 MED ORDER — KETOROLAC TROMETHAMINE 30 MG/ML IJ SOLN
30.0000 mg | Freq: Once | INTRAMUSCULAR | Status: DC
  Filled 2022-10-07: qty 1

## 2022-10-07 NOTE — Discharge Instructions (Signed)
Call make an appoint with your primary care provider if any continued problems or concerns.  Moist heat or ice to your back as needed for discomfort.  2 prescriptions were sent to your pharmacy 1 is a muscle relaxant and cannot be taken while you are driving or operating machinery.  The ibuprofen is 800 mg and is to be taken with food every 8 hours as needed.  No stretching or exercising for 1 week to allow your muscles to heal.

## 2022-10-07 NOTE — ED Triage Notes (Signed)
Pt comes with c/o right upper mid back pain after stretching this am. Pt states pain with movement. Pt states 8/10 pain.

## 2022-10-07 NOTE — ED Provider Notes (Signed)
[facility] Provider Note    Event Date/Time   First MD Initiated Contact with Patient 10/07/22 0915     (approximate)   History   Back Pain   HPI  Jasmine Gordon is a 36 y.o. female   presents to the ED with complaint of upper back pain after she stretched this morning twisting to the right.  She denies a fall or direct trauma to her back.  She states that she took 400 mg of ibuprofen 20 minutes prior to arrival.  Patient has history of anxiety disorder with panic attacks, PTSD, vitamin D deficiency, iron deficiency anemia, and asthma.      Physical Exam   Triage Vital Signs: ED Triage Vitals [10/07/22 0908]  Enc Vitals Group     BP 115/80     Pulse Rate (!) 110     Resp 19     Temp 98 F (36.7 C)     Temp src      SpO2 100 %     Weight      Height      Head Circumference      Peak Flow      Pain Score 8     Pain Loc      Pain Edu?      Excl. in GC?     Most recent vital signs: Vitals:   10/07/22 0908 10/07/22 1023  BP: 115/80 120/78  Pulse: (!) 110 100  Resp: 19 18  Temp: 98 F (36.7 C)   SpO2: 100% 100%     General: Awake, no distress.  CV:  Good peripheral perfusion.  Heart regular rate and rhythm. Resp:  Normal effort.  Lungs are clear bilaterally. Abd:  No distention.  Other:  Examination of the back there is no gross deformity, edema or discoloration.  There is tenderness on palpation of the right paravertebral muscles adjacent to the scapula.  No tenderness on palpation of the thoracic spine or step-offs are noted.  Range of motion is minimally restricted secondary to discomfort.  Patient is able to stand and ambulate without any assistance.  No lower lumbar pain on palpation.   ED Results / Procedures / Treatments   Labs (all labs ordered are listed, but only abnormal results are displayed) Labs Reviewed  POC URINE PREG, ED     PROCEDURES:  Critical Care performed:   Procedures   MEDICATIONS ORDERED  IN ED: Medications  ketorolac (TORADOL) 30 MG/ML injection 30 mg (30 mg Intramuscular Not Given 10/07/22 1010)     IMPRESSION / MDM / ASSESSMENT AND PLAN / ED COURSE  I reviewed the triage vital signs and the nursing notes.   Differential diagnosis includes, but is not limited to, muscle strain, muscle skeletal pain, thoracic pain, compression fracture.  35 year old female presents to the ED with complaint of mid upper back pain after stretching this morning.  She took ibuprofen 20 minutes prior to arrival without any relief of her pain.  Physical exam is consistent with a muscle strain.  Patient was given Toradol 30 mg IM after a negative pregnancy test.  Because she drove she was made aware that the muscle relaxant will be sent to the pharmacy.  She is encouraged to use ice or heat to her back as needed for discomfort.  For work she is restricted to no lifting over 10 pounds for approximately 1 week.  Is to follow-up with her PCP if any continued problems or concerns.  Patient's presentation is most consistent with acute complicated illness / injury requiring diagnostic workup.  FINAL CLINICAL IMPRESSION(S) / ED DIAGNOSES   Final diagnoses:  Thoracic myofascial strain, initial encounter     Rx / DC Orders   ED Discharge Orders          Ordered    methocarbamol (ROBAXIN) 500 MG tablet        10/07/22 0957    ibuprofen (ADVIL) 800 MG tablet  Every 8 hours PRN        10/07/22 0957             Note:  This document was prepared using Dragon voice recognition software and may include unintentional dictation errors.   Tommi Rumps, PA-C 10/07/22 1215    Arnaldo Natal, MD 10/07/22 [phone]

## 2022-10-07 NOTE — ED Notes (Signed)
See triage note  Presents with pain to lower back  States she developed pain after stretching  Denies any urinary sx; or fall

## 2022-10-08 ENCOUNTER — Other Ambulatory Visit: Payer: Self-pay | Admitting: Psychiatry

## 2022-10-10 ENCOUNTER — Ambulatory Visit (INDEPENDENT_AMBULATORY_CARE_PROVIDER_SITE_OTHER): Payer: Medicaid Other | Admitting: Licensed Clinical Social Worker

## 2022-10-10 DIAGNOSIS — F4310 Post-traumatic stress disorder, unspecified: Secondary | ICD-10-CM

## 2022-10-10 DIAGNOSIS — F411 Generalized anxiety disorder: Secondary | ICD-10-CM | POA: Diagnosis not present

## 2022-10-10 NOTE — Progress Notes (Signed)
Virtual Visit via Video Note  I connected with Ernesteen [name] on 10/10/22 at  3:00 PM EST by a video enabled telemedicine application and verified that I am speaking with the correct person using two identifiers.  Location: Patient: home Provider: remote office [place], [place])   I discussed the limitations of evaluation and management by telemedicine and the availability of in person appointments. The patient expressed understanding and agreed to proceed.   I discussed the assessment and treatment plan with the patient. The patient was provided an opportunity to ask questions and all were answered. The patient agreed with the plan and demonstrated an understanding of the instructions.   The patient was advised to call back or seek an in-person evaluation if the symptoms worsen or if the condition fails to improve as anticipated.  I provided 30 minutes of non-face-to-face time during this encounter.   Mikhaela Zaugg R Kalan Yeley, LCSW   THERAPIST PROGRESS NOTE  Session Time: 3:15-345p  Participation Level: Active  Behavioral Response: Neat and Well GroomedAlertAnxious  Type of Therapy: Individual Therapy  Treatment Goals addressed:   ProgressTowards Goals: Progressing  Interventions: CBT  Summary: Satcha Chemeka Filice is a 36 y.o. female who presents with improving symptoms related to PTSD symptoms. Pt feels mood is stable and that she is getting good quality and quantity of sleep.  Allowed pt to explore and express thoughts and feelings associated with recent life situations and external stressors.   Continued recommendations are as follows: self care behaviors, positive social engagements, focusing on overall work/home/life balance, and focusing on positive physical and emotional wellness.   Suicidal/Homicidal: No  Therapist Response: The ongoing treatment plan includes: therapeutic work on building and maintaining self-care; addressing problematic coping strategies and  mechanisms; and helping the patient gain greater awareness, understanding, and expression of underlying emotions. Treatment continues to show good evolution and development. Treatment to continue as indicated.  Plan: Return again in 4 weeks.  Diagnosis:  Encounter Diagnoses  Name Primary?   PTSD (post-traumatic stress disorder) Yes   GAD (generalized anxiety disorder)     Collaboration of Care: Other Pt to continue care with psychiatrist of record, Dr. Vanetta Shawl  Patient/Guardian was advised Release of Information must be obtained prior to any record release in order to collaborate their care with an outside provider. Patient/Guardian was advised if they have not already done so to contact the registration department to sign all necessary forms in order for [place] to release information regarding their care.   Consent: Patient/Guardian gives verbal consent for treatment and assignment of benefits for services provided during this visit. Patient/Guardian expressed understanding and agreed to proceed.   Ernest Haber Cordai Rodrigue, LCSW 10/10/2022

## 2022-10-11 NOTE — Plan of Care (Signed)
  Problem: PTSD-Trauma Disorder Goal: LTG: Reduce the negative impact trauma related symptoms have on social, occupational, and family functioning per pt self report 3 out of 5 sessions documented.  Outcome: Progressing Goal: STG: Reduction in intrusive event recollections, avoidance of event reminders, intense arousal, or disinterest in activities or relationships. 3 out of 5 sessions documented Outcome: Progressing Intervention: Encourage verbalization of feelings/concerns/expectations Note: Allowed/explored  Intervention: Encourage self-care activities Note: Continued to encourage

## 2022-11-01 ENCOUNTER — Other Ambulatory Visit: Payer: Medicaid Other | Admitting: Obstetrics and Gynecology

## 2022-11-01 ENCOUNTER — Encounter: Payer: Self-pay | Admitting: Internal Medicine

## 2022-11-01 ENCOUNTER — Ambulatory Visit (INDEPENDENT_AMBULATORY_CARE_PROVIDER_SITE_OTHER): Payer: Medicaid Other | Admitting: Internal Medicine

## 2022-11-01 ENCOUNTER — Encounter: Payer: Self-pay | Admitting: Obstetrics and Gynecology

## 2022-11-01 VITALS — BP 124/78 | HR 100 | Temp 98.0°F | Ht 67.0 in | Wt 256.0 lb

## 2022-11-01 DIAGNOSIS — G4719 Other hypersomnia: Secondary | ICD-10-CM

## 2022-11-01 NOTE — Patient Instructions (Signed)
Hi Jasmine Gordon, glad things are going well-have a wonderful holiday and look forward to speaking with you the next time!!  Jasmine Gordon was given information about Medicaid Managed Care team care coordination services as a part of their Healthy [facility] benefit. Jasmine Gordon verbally consented to engagement with the [facility] Managed Care team.   If you are experiencing a medical emergency, please call 911 or report to your local emergency department or urgent care.   If you have a non-emergency medical problem during routine business hours, please contact your provider's office and ask to speak with a nurse.   For questions related to your Healthy [facility] health plan, please call: [phone] or visit the homepage here: [email or web address]  If you would like to schedule transportation through your Healthy [facility] plan, please call the following number at least 2 days in advance of your appointment: [phone]  For information about your ride after you set it up, call Ride Assist at [phone]. Use this number to activate a Will Call pickup, or if your transportation is late for a scheduled pickup. Use this number, too, if you need to make a change or cancel a previously scheduled reservation.  If you need transportation services right away, call [phone]. The after-hours call center is staffed 24 hours to handle ride assistance and urgent reservation requests (including discharges) 365 days a year. Urgent trips include sick visits, hospital discharge requests and life-sustaining treatment.  Call the [facility] Line at [phone], at any time, 24 hours a day, 7 days a week. If you are in danger or need immediate medical attention call 911.  If you would like help to quit smoking, call 1-800-QUIT-NOW (438-127-8581) OR Espaol: 1-855-Djelo-Ya (7-106-269-4854) o para ms informacin haga clic aqu or Text  READY to 200-400 to register via text  [name] following are the goals we discussed in your visit today:   Goals Addressed   None   Timeframe:  Long-Range Goal Priority:  High Start Date:   03/07/22                          Expected End Date:     ongoing                  Follow Up Date 12/06/21   - schedule appointment for flu shot - schedule appointment for vaccines needed due to my age or health - schedule recommended health tests (blood work, mammogram, colonoscopy, pap test) - schedule and keep appointment for annual check-up    Why is this important?   Screening tests can find diseases early when they are easier to treat.  Your doctor or nurse will talk with you about which tests are important for you.  Getting shots for common diseases like the flu and shingles will help prevent them.  11/01/22:  Up to date on all appts-saw PULM today-to have sleep study performed.  Therapy continues.  Patient verbalizes understanding of instructions and care plan provided today and agrees to view in MyChart. Active MyChart status and patient understanding of how to access instructions and care plan via MyChart confirmed with patient.     The Managed Medicaid care management team will reach out to the patient again over the next 30 business  days.  The  Patient has been provided with contact information for the Managed Medicaid care management team and has been advised to call with any  health related questions or concerns.   Jasmine Der RN, BSN [place]  [facility] Care Management Coordinator - Managed Medicaid High Risk [phone]   Following is a copy of your plan of care:  Care Plan : RN Care Manager Plan of Care  Updates made by [name], RN since 11/01/2022 12:00 AM     Problem: Health Promotion or Disease Self-Management (General Plan of Care)      Long-Range Goal: Chronic Disease Management and Care Coordination Needs   Expected End Date:  12/30/2022  Priority: High  Note:   Current Barriers:  Knowledge Deficits related to plan of care for management of Asthma, anxiety  Chronic Disease Management support and education needs related to Asthma, anxiety 11/01/22:  No complaints today, new job going well-appealing disability.  To have sleep study done.  Therapy continues.  RNCM Clinical Goal(s):  Patient will verbalize understanding of plan for management of Asthma, anxiety as evidenced by patient report verbalize basic understanding of  Asthma disease process and self health management plan as evidenced by patient report take all medications exactly as prescribed and will call provider for medication related questions as evidenced by patient report demonstrate understanding of rationale for each prescribed medication as evidenced by patient report attend all scheduled medical appointments: as evidenced by patient report continue to work with RN Care Manager to address care management and care coordination needs related to  Asthma as evidenced by adherence to CM Team Scheduled appointments work with Child psychotherapist to address  related to the management of Mental Health Concerns  related to the management of Anxiety, Depression, and PTSD, panic attacks as evidenced by review of EMR and patient or social worker report  Interventions: Inter-disciplinary care team collaboration (see longitudinal plan of care) Evaluation of current treatment plan related to  self management and patient's adherence to plan as established by provider  Asthma: (Status:New goal.) Long Term Goal Advised patient to track and manage Asthma triggers Advised patient to self assesses Asthma action plan zone and make appointment with provider if in the yellow zone for 48 hours without improvement Discussed the importance of adequate rest and management of fatigue with Asthma Assessed social determinant of health barriers  Collaborated with SW SW referral for  anxiety/depression/panic attacks-completed  Patient Goals/Self-Care Activities: Take all medications as prescribed Attend all scheduled provider appointments Call pharmacy for medication refills 3-7 days in advance of running out of medications Perform all self care activities independently  Perform IADL's (shopping, preparing meals, housekeeping, managing finances) independently Call provider office for new concerns or questions  Work with the social worker to address care coordination needs and will continue to work with the clinical team to address health care and disease management related needs Patient to contact Pharmacy and/or Provider regarding medication  Follow Up Plan:  The patient has been provided with contact information for the care management team and has been advised to call with any health related questions or concerns.  The care management team will reach out to the patient again over the next 30 business  days.

## 2022-11-01 NOTE — Patient Outreach (Signed)
Medicaid Managed Care   Nurse Care Manager Note  11/01/2022 Name:  Jasmine Gordon MRN:  [id number] DOB:  Jun 21, 1986  Jasmine Gordon is an 36 y.o. year old female who is a primary patient of [name], DO.  The [facility] Managed Care Coordination team was consulted for assistance with:    Chronic healthcare management needs, asthma, anxiety/depression/PTSD, HLD  Jasmine Gordon was given information about Medicaid Managed Care Coordination team services today. Jasmine Gordon Patient agreed to services and verbal consent obtained.  Engaged with patient by telephone for follow up visit in response to provider referral for case management and/or care coordination services.   Assessments/Interventions:  Review of past medical history, allergies, medications, health status, including review of consultants reports, laboratory and other test data, was performed as part of comprehensive evaluation and provision of chronic care management services.  SDOH (Social Determinants of Health) assessments and interventions performed: SDOH Interventions    Flowsheet Row Patient Outreach Telephone from 11/01/2022 in [place] [facility] Patient Outreach Telephone from 09/29/2022 in [place] [facility] Office Visit from 09/16/2022 in [facility] Patient Outreach Telephone from 08/31/2022 in [facility] Care Coordination Patient Outreach Telephone from 08/17/2022 in [facility] Care Coordination Patient Outreach Telephone from 07/27/2022 in [facility] Care Coordination  SDOH Interventions        Food Insecurity Interventions -- Intervention Not Indicated -- -- -- --  Housing Interventions -- -- -- Intervention Not Indicated -- --  Transportation Interventions -- -- -- -- -- Intervention Not Indicated  Utilities Interventions -- -- -- -- -- Intervention Not  Indicated  Alcohol Usage Interventions -- Intervention Not Indicated (Score <7) -- -- -- --  Depression Interventions/Treatment  -- -- Medication -- -- --  Financial Strain Interventions Intervention Not Indicated -- -- -- -- --  Physical Activity Interventions Intervention Not Indicated -- -- -- -- --  Stress Interventions -- -- -- -- Provide Counseling, Offered [employer] Resources --      Care Plan  Allergies  Allergen Reactions   Reglan [Metoclopramide] Hives and Shortness Of Breath   Contrave [Naltrexone-Bupropion Hcl Er] Other (See Comments)    Had some reaction to the medication    Medications Reviewed Today     Reviewed by [name], RN (Registered Nurse) on 11/01/22 at 1529  Med List Status: <None>   Medication Order Taking? Sig Documenting Provider Last Dose Status Informant  albuterol (VENTOLIN HFA) 108 (90 Base) MCG/ACT inhaler [id number] No Inhale 2 puffs into the lungs every 6 (six) hours as needed for wheezing or shortness of breath. [name], DO Taking Active   cetirizine (ZYRTEC) 10 MG tablet [id number] No Take 10 mg by mouth daily as needed for allergies. [provider] Taking Active   chlorhexidine (PERIDEX) 0.12 % solution [id number] No Use as directed 10 mLs in the mouth or throat. [provider] Taking Active   dicyclomine (BENTYL) 10 MG capsule [id number] No Take 1 capsule (10 mg total) by mouth 4 (four) times daily -  before meals and at bedtime. Delton Prairie, MD Taking Active   escitalopram (LEXAPRO) 10 MG tablet [id number] No 5 mg daily for one week, then 10 mg daily Hisada, Barbee Cough, MD Taking Active   fluticasone (FLONASE) 50 MCG/ACT nasal spray [id number] No Place 1 spray into both nostrils daily. Use for 4-6 weeks then stop and use seasonally or as needed. Nicki Reaper  W, NP Taking Active   hydrOXYzine (ATARAX) 10 MG tablet [id number] No Take 1 tablet (10 mg total) by mouth 3 (three) times daily as needed for anxiety.  [name], DO Taking Active   ibuprofen (ADVIL) 800 MG tablet [id number] No Take 1 tablet (800 mg total) by mouth every 8 (eight) hours as needed. Tommi Rumps, PA-C Taking Active   methocarbamol (ROBAXIN) 500 MG tablet [id number] No Take 1-2 tablets every 6 hours prn muscle spasms Tommi Rumps, PA-C Taking Active   metoprolol tartrate (LOPRESSOR) 25 MG tablet [id number] No Take 0.5 tablets (12.5 mg total) by mouth 2 (two) times daily. Furth, Cadence H, PA-C Taking Expired 08/16/22 2359   montelukast (SINGULAIR) 10 MG tablet [id number] No Take 10 mg by mouth at bedtime as needed. [provider] Taking Active   Multiple Vitamins-Minerals (ONE-A-DAY WOMENS PO) [id number] No Take 1 tablet by mouth daily. [provider] Taking Active   OVER THE COUNTER MEDICATION [id number] No Goli ashwaganda gummy 1/2 daily [provider] Taking Active   pantoprazole (PROTONIX) 40 MG tablet [id number] No Take 1 tablet (40 mg total) by mouth daily. Fransico Michael, Cadence H, PA-C Taking Active   Spacer/Aero-Holding Chambers (AEROCHAMBER MV) inhaler [id number] No Use as instructed [name], DO Taking Active   Vitamin D, Ergocalciferol, (DRISDOL) 1.25 MG (50000 UNIT) CAPS capsule [id number] No Take 1 capsule (50,000 Units total) by mouth every 7 (seven) days. [name], DO Taking Active            Patient Active Problem List   Diagnosis Date Noted   Vitamin B12 deficiency 06/09/2022   Palpitations 01/21/2022   Shortness of breath 01/21/2022   Chest pain 01/21/2022   Generalized anxiety disorder with panic attacks 09/27/2021   Environmental and seasonal allergies 11/04/2020   Genital warts 09/11/2018   Positive TB test 09/25/2017   Allergic contact dermatitis due to metals 05/17/2017   Pre-diabetes 03/06/2017   Hyperlipidemia 03/06/2017   Allergic reaction 02/08/2017   Urticaria due to food allergy 02/08/2017   Vitamin D deficiency 11/08/2016    Iron deficiency anemia due to chronic blood loss 10/03/2016   Menorrhagia with regular cycle 06/27/2016   Mild persistent asthma without complication 06/17/2016   Anxiety and depression 06/17/2016   Morbid obesity (HCC) 06/17/2016   PTSD (post-traumatic stress disorder) 2016   Conditions to be addressed/monitored per PCP order:  Chronic healthcare management needs, asthma, anxiety/depression/PTSD, HLD  Care Plan : RN Care Manager Plan of Care  Updates made by [name], RN since 11/01/2022 12:00 AM     Problem: Health Promotion or Disease Self-Management (General Plan of Care)      Long-Range Goal: Chronic Disease Management and Care Coordination Needs   Expected End Date: 12/30/2022  Priority: High  Note:   Current Barriers:  Knowledge Deficits related to plan of care for management of Asthma, anxiety  Chronic Disease Management support and education needs related to Asthma, anxiety 11/01/22:  No complaints today, new job going well-appealing disability.  To have sleep study done.  Therapy continues.  RNCM Clinical Goal(s):  Patient will verbalize understanding of plan for management of Asthma, anxiety as evidenced by patient report verbalize basic understanding of  Asthma disease process and self health management plan as evidenced by patient report take all medications exactly as prescribed and will call provider for medication related questions as evidenced by patient report demonstrate understanding of rationale for each prescribed medication as evidenced by patient  report attend all scheduled medical appointments: as evidenced by patient report continue to work with RN Care Manager to address care management and care coordination needs related to  Asthma as evidenced by adherence to CM Team Scheduled appointments work with social worker to address  related to the management of Mental Health Concerns  related to the management of Anxiety, Depression, and PTSD, panic attacks  as evidenced by review of EMR and patient or social worker report  Interventions: Inter-disciplinary care team collaboration (see longitudinal plan of care) Evaluation of current treatment plan related to  self management and patient's adherence to plan as established by provider  Asthma: (Status:New goal.) Long Term Goal Advised patient to track and manage Asthma triggers Advised patient to self assesses Asthma action plan zone and make appointment with provider if in the yellow zone for 48 hours without improvement Discussed the importance of adequate rest and management of fatigue with Asthma Assessed social determinant of health barriers  Collaborated with SW SW referral for anxiety/depression/panic attacks-completed  Patient Goals/Self-Care Activities: Take all medications as prescribed Attend all scheduled provider appointments Call pharmacy for medication refills 3-7 days in advance of running out of medications Perform all self care activities independently  Perform IADL's (shopping, preparing meals, housekeeping, managing finances) independently Call provider office for new concerns or questions  Work with the social worker to address care coordination needs and will continue to work with the clinical team to address health care and disease management related needs Patient to contact Pharmacy and/or Provider regarding medication  Follow Up Plan:  The patient has been provided with contact information for the care management team and has been advised to call with any health related questions or concerns.  The care management team will reach out to the patient again over the next 30 business  days.     Long-Range Goal: Establish Plan of Care for Chronic Disease Management Needs   Priority: High  Note:   Timeframe:  Long-Range Goal Priority:  High Start Date:   03/07/22                          Expected End Date:     ongoing                  Follow Up Date 12/06/21   -  schedule appointment for flu shot - schedule appointment for vaccines needed due to my age or health - schedule recommended health tests (blood work, mammogram, colonoscopy, pap test) - schedule and keep appointment for annual check-up    Why is this important?   Screening tests can find diseases early when they are easier to treat.  Your doctor or nurse will talk with you about which tests are important for you.  Getting shots for common diseases like the flu and shingles will help prevent them.  11/01/22:  Up to date on all appts-saw PULM today-to have sleep study performed.  Therapy continues.   Follow Up:  Patient agrees to Care Plan and Follow-up.  Plan: The Managed Medicaid care management team will reach out to the patient again over the next 30 business  days. and The  Patient has been provided with contact information for the Managed Medicaid care management team and has been advised to call with any health related questions or concerns.  Date/time of next scheduled RN care management/care coordination outreach:  12/06/22 at 1030.

## 2022-11-01 NOTE — Patient Instructions (Addendum)
Obtain SLeep Study to assess for sleep apnea  Recommend weight loss

## 2022-11-01 NOTE — Addendum Note (Signed)
Addended by: Lajoyce Lauber A on: 11/01/2022 09:46 AM   Modules accepted: Orders

## 2022-11-01 NOTE — Progress Notes (Signed)
Name: Jasmine Gordon MRN: [id number] DOB: 1986-03-14     CHIEF COMPLAINT: Excessive daytime sleepiness HISTORY OF PRESENT ILLNESS: Patient is seen today for problems and issues with sleep related to excessive daytime sleepiness Patient  has been having sleep problems for many years Patient has been having excessive daytime sleepiness for a long time Patient has been having extreme fatigue and tiredness, lack of energy +  very Loud snoring every night + struggling breathe at night and gasps for air   Discussed sleep data and reviewed with patient.  Encouraged proper weight management.  Discussed driving precautions and its relationship with hypersomnolence.  Discussed operating dangerous equipment and its relationship with hypersomnolence.  Discussed sleep hygiene, and benefits of a fixed sleep waked time.  The importance of getting eight or more hours of sleep discussed with patient.  Discussed limiting the use of the computer and television before bedtime.  Decrease naps during the day, so night time sleep will become enhanced.  Limit caffeine, and sleep deprivation.  HTN, stroke, and heart failure are potential risk factors.    EPWORTH SLEEP SCORE 10  Approximate weight 260 pounds Tired throughout the day Wants to take frequent naps Non-smoker Does not drink alcohol Works with adults with intellectual disabilities    PAST MEDICAL HISTORY :   has a past medical history of Allergy, Anemia, Anxiety, Asthma, Migraine, Obese, Panic attack, and PTSD (post-traumatic stress disorder) (2016).  has a past surgical history that includes Appendectomy; Plantar warts; LEEP (12/2020); and Wisdom tooth extraction. Prior to Admission medications   Medication Sig Start Date End Date Taking? Authorizing Provider  albuterol (VENTOLIN HFA) 108 (90 Base) MCG/ACT inhaler Inhale 2 puffs into the lungs every 6 (six) hours as needed for wheezing or shortness of breath. 09/07/22  Yes  Karamalegos, Netta Neat, DO  cetirizine (ZYRTEC) 10 MG tablet Take 10 mg by mouth daily as needed for allergies.   Yes [provider]  chlorhexidine (PERIDEX) 0.12 % solution Use as directed 10 mLs in the mouth or throat. 01/20/22  Yes [provider]  dicyclomine (BENTYL) 10 MG capsule Take 1 capsule (10 mg total) by mouth 4 (four) times daily -  before meals and at bedtime. 09/09/22  Yes Delton Prairie, MD  escitalopram (LEXAPRO) 10 MG tablet 5 mg daily for one week, then 10 mg daily 09/07/22  Yes Hisada, Reina, MD  fluticasone (FLONASE) 50 MCG/ACT nasal spray Place 1 spray into both nostrils daily. Use for 4-6 weeks then stop and use seasonally or as needed. 03/27/21  Yes Lorre Munroe, NP  hydrOXYzine (ATARAX) 10 MG tablet Take 1 tablet (10 mg total) by mouth 3 (three) times daily as needed for anxiety. 02/08/22  Yes Karamalegos, Netta Neat, DO  ibuprofen (ADVIL) 800 MG tablet Take 1 tablet (800 mg total) by mouth every 8 (eight) hours as needed. 10/07/22  Yes Tommi Rumps, PA-C  methocarbamol (ROBAXIN) 500 MG tablet Take 1-2 tablets every 6 hours prn muscle spasms 10/07/22  Yes Bridget Hartshorn L, PA-C  montelukast (SINGULAIR) 10 MG tablet Take 10 mg by mouth at bedtime as needed.   Yes [provider]  Multiple Vitamins-Minerals (ONE-A-DAY WOMENS PO) Take 1 tablet by mouth daily.   Yes [provider]  OVER THE COUNTER MEDICATION Goli ashwaganda gummy 1/2 daily   Yes [provider]  pantoprazole (PROTONIX) 40 MG tablet Take 1 tablet (40 mg total) by mouth daily. 05/18/22  Yes Furth, Cadence H, PA-C  Spacer/Aero-Holding  Chambers (AEROCHAMBER MV) inhaler Use as instructed 03/14/22  Yes Karamalegos, Netta Neat, DO  Vitamin D, Ergocalciferol, (DRISDOL) 1.25 MG (50000 UNIT) CAPS capsule Take 1 capsule (50,000 Units total) by mouth every 7 (seven) days. 07/27/21  Yes Karamalegos, Netta Neat, DO  metoprolol tartrate (LOPRESSOR) 25 MG tablet Take 0.5 tablets  (12.5 mg total) by mouth 2 (two) times daily. 05/18/22 08/16/22  Furth, Cadence H, PA-C   Allergies  Allergen Reactions   Reglan [Metoclopramide] Hives and Shortness Of Breath   Contrave [Naltrexone-Bupropion Hcl Er] Other (See Comments)    Had some reaction to the medication    FAMILY HISTORY:  family history includes Alcohol abuse in her mother; Bipolar disorder in her mother; Depression in her mother; Diabetes in her father, maternal grandfather, and mother; Drug abuse in her mother; Heart disease in her father; Heart failure in her sister; Mental retardation in her mother; Stroke in her maternal grandfather. SOCIAL HISTORY:  reports that she has never smoked. She has never used smokeless tobacco. She reports that she does not currently use alcohol. She reports that she does not use drugs.   Review of Systems:  Gen:  Denies  fever, sweats, chills weight loss  HEENT: Denies blurred vision, double vision, ear pain, eye pain, hearing loss, nose bleeds, sore throat Cardiac:  No dizziness, chest pain or heaviness, chest tightness,edema, No JVD Resp:   No cough, -sputum production, -shortness of breath,-wheezing, -hemoptysis,  Gi: Denies swallowing difficulty, stomach pain, nausea or vomiting, diarrhea, constipation, bowel incontinence Gu:  Denies bladder incontinence, burning urine Ext:   Denies Joint pain, stiffness or swelling Skin: Denies  skin rash, easy bruising or bleeding or hives Endoc:  Denies polyuria, polydipsia , polyphagia or weight change Psych:   Denies depression, insomnia or hallucinations  Other:  All other systems negative   ALL OTHER ROS ARE NEGATIVE  BP 124/78 (BP Location: Left Arm, Cuff Size: Normal)   Pulse 100   Temp 98 F (36.7 C) (Temporal)   Ht 5\' 7"  (1.702 m)   Wt 256 lb (116.1 kg)   LMP 09/25/2022   SpO2 98%   BMI 40.10 kg/m    Physical Examination:   General Appearance: No distress  EYES PERRLA, EOM intact.   NECK Supple, No JVD Pulmonary:  normal breath sounds, No wheezing.  CardiovascularNormal S1,S2.  No m/r/g.   Abdomen: Benign, Soft, non-tender. Skin:   warm, no rashes, no ecchymosis  Extremities: normal, no cyanosis, clubbing. Neuro:without focal findings,  speech normal  PSYCHIATRIC: Mood, affect within normal limits.   ALL OTHER ROS ARE NEGATIVE      ASSESSMENT AND PLAN SYNOPSIS  36 year old pleasant African-American female seen today for signs symptoms of snoring and gasping for air in the setting of obesity findings to suggest obstructive sleep apnea  Recommend obtaining sleep study  Obesity -recommend significant weight loss -recommend changing diet  Deconditioned state -Recommend increased daily activity and exercise   MEDICATION ADJUSTMENTS/LABS AND TESTS ORDERED: Obtain SLeep Study to assess for sleep apnea    Recommend weight loss   CURRENT MEDICATIONS REVIEWED AT LENGTH WITH PATIENT TODAY   Patient  satisfied with Plan of action and management. All questions answered  Follow up 3 months  Total Time Spent  32 mins   20 Wallis Bamberg, M.D.  [facility]  Medical Director [facility] Medical Director [facility] Cardio-Pulmonary Department

## 2022-11-17 ENCOUNTER — Other Ambulatory Visit: Payer: Medicaid Other

## 2022-11-17 NOTE — Patient Outreach (Signed)
BSW completed a telephone outreach with patient. She stated she was at work and gets off at [employer], BSW and patient agreed to a call back at 2:30.   [name], BSW, [facility]    High Risk Managed Medicaid Team  [phone]

## 2022-11-20 ENCOUNTER — Other Ambulatory Visit: Payer: Self-pay

## 2022-11-20 ENCOUNTER — Emergency Department: Payer: Medicaid Other

## 2022-11-20 ENCOUNTER — Emergency Department
Admission: EM | Admit: 2022-11-20 | Discharge: 2022-11-21 | Disposition: A | Discharge status: Home / Self Care | Payer: Medicaid Other | Attending: Emergency Medicine | Admitting: Emergency Medicine

## 2022-11-20 DIAGNOSIS — R519 Headache, unspecified: Secondary | ICD-10-CM | POA: Diagnosis not present

## 2022-11-20 DIAGNOSIS — Z1152 Encounter for screening for COVID-19: Secondary | ICD-10-CM | POA: Diagnosis not present

## 2022-11-20 DIAGNOSIS — R112 Nausea with vomiting, unspecified: Secondary | ICD-10-CM | POA: Insufficient documentation

## 2022-11-20 DIAGNOSIS — F419 Anxiety disorder, unspecified: Secondary | ICD-10-CM | POA: Diagnosis not present

## 2022-11-20 DIAGNOSIS — F32A Depression, unspecified: Secondary | ICD-10-CM | POA: Diagnosis not present

## 2022-11-20 LAB — RESP PANEL BY RT-PCR (RSV, FLU A&B, COVID)  RVPGX2
Influenza A by PCR: NEGATIVE
Influenza B by PCR: NEGATIVE
Resp Syncytial Virus by PCR: NEGATIVE
SARS Coronavirus 2 by RT PCR: NEGATIVE

## 2022-11-20 MED ORDER — KETOROLAC TROMETHAMINE 30 MG/ML IJ SOLN
30.0000 mg | Freq: Once | INTRAMUSCULAR | Status: DC
  Filled 2022-11-20: qty 1

## 2022-11-20 MED ORDER — DROPERIDOL 2.5 MG/ML IJ SOLN
2.5000 mg | Freq: Once | INTRAMUSCULAR | Status: DC
  Filled 2022-11-20: qty 2

## 2022-11-20 MED ORDER — ACETAMINOPHEN 500 MG PO TABS
1000.0000 mg | ORAL_TABLET | Freq: Once | ORAL | Status: AC
  Administered 2022-11-21: 1000 mg via ORAL
  Filled 2022-11-20: qty 2

## 2022-11-20 MED ORDER — BUTALBITAL-APAP-CAFFEINE 50-325-40 MG PO TABS: 2.0000 | ORAL_TABLET | Freq: Once | ORAL | Status: DC
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
## 2022-11-20 NOTE — ED Triage Notes (Signed)
Patient states that she woke up this morning with a minor headache that has worsened throughout the day; She then became nauseated and had a single episode of vomiting; States that she usually gets headaches when she's on her menstrual cycle (which she currently is) but this headache "feels different"

## 2022-11-20 NOTE — ED Triage Notes (Signed)
FIRST NURSE NOTE:  Pt arrived via POV with reports of HA, states it feels better when she is standing up.

## 2022-11-21 DIAGNOSIS — R519 Headache, unspecified: Secondary | ICD-10-CM | POA: Diagnosis not present

## 2022-11-21 NOTE — Discharge Instructions (Signed)
Please take Tylenol and ibuprofen/Advil for your pain.  It is safe to take them together, or to alternate them every few hours.  Take up to 1000mg of Tylenol at a time, up to 4 times per day.  Do not take more than 4000 mg of Tylenol in 24 hours.  For ibuprofen, take 400-600 mg, 3 - 4 times per day.  

## 2022-11-30 ENCOUNTER — Emergency Department: Payer: Medicaid Other

## 2022-11-30 ENCOUNTER — Other Ambulatory Visit: Payer: Self-pay

## 2022-11-30 ENCOUNTER — Emergency Department
Admission: EM | Admit: 2022-11-30 | Discharge: 2022-11-30 | Disposition: A | Discharge status: Home / Self Care | Payer: Medicaid Other | Attending: Emergency Medicine | Admitting: Emergency Medicine

## 2022-11-30 DIAGNOSIS — R0789 Other chest pain: Secondary | ICD-10-CM | POA: Insufficient documentation

## 2022-11-30 LAB — CBC
HCT: 35.8 % — ABNORMAL LOW (ref 36.0–46.0)
Hemoglobin: 11.3 g/dL — ABNORMAL LOW (ref 12.0–15.0)
MCH: 26.3 pg (ref 26.0–34.0)
MCHC: 31.6 g/dL (ref 30.0–36.0)
MCV: 83.3 fL (ref 80.0–100.0)
Platelets: 234 10*3/uL (ref 150–400)
RBC: 4.3 MIL/uL (ref 3.87–5.11)
RDW: 13.1 % (ref 11.5–15.5)
WBC: 4.7 10*3/uL (ref 4.0–10.5)
nRBC: 0 % (ref 0.0–0.2)

## 2022-11-30 LAB — BASIC METABOLIC PANEL
Anion gap: 7 (ref 5–15)
BUN: 12 mg/dL (ref 6–20)
CO2: 23 mmol/L (ref 22–32)
Calcium: 9 mg/dL (ref 8.9–10.3)
Chloride: 107 mmol/L (ref 98–111)
Creatinine, Ser: 0.73 mg/dL (ref 0.44–1.00)
GFR, Estimated: >60 mL/min (ref >60)
Glucose, Bld: 109 mg/dL — ABNORMAL HIGH (ref 70–99)
Potassium: 3.5 mmol/L (ref 3.5–5.1)
Sodium: 137 mmol/L (ref 135–145)

## 2022-11-30 LAB — TROPONIN I (HIGH SENSITIVITY): Troponin I (High Sensitivity): <2 ng/L (ref <18)

## 2022-11-30 MED ORDER — COMPRESSOR/NEBULIZER MISC: 1.0000 [IU] | Freq: Once | 0 refills | Status: AC

## 2022-11-30 MED ORDER — ALBUTEROL SULFATE (2.5 MG/3ML) 0.083% IN NEBU: 2.5000 mg | INHALATION_SOLUTION | RESPIRATORY_TRACT | 2 refills | Status: DC | PRN

## 2022-11-30 NOTE — ED Triage Notes (Signed)
Pt here with chest discomfort and tightness. Pt states she feels like her work environment is causing the discomfort. Pt states the tightness is all across her chest.

## 2022-11-30 NOTE — ED Notes (Signed)
Pt to ED for chest tightness, see triage note. Ambulatory to treatment room, no acute distress. Son also with pt to be seen for separate issue. EDP talked to pt at bedside.

## 2022-11-30 NOTE — ED Provider Notes (Signed)
[facility] Provider Note   Event Date/Time   First MD Initiated Contact with Patient 11/30/22 1505     (approximate) History  Chest Pain  HPI Jasmine Gordon is a 37 y.o. female with stated past medical history of asthma who presents for chest tightness and shortness of breath that she states is worse when she is trying to sleep at night.  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  Patient states that she has been using her albuterol inhaler but believes that it does not work as well as her albuterol nebulizer which is not working anymore.  Patient states she has been taking all of her other medications on time and as prescribed.  Patient states that the symptoms have been present for over 2 weeks now. ROS: Patient currently denies any vision changes, tinnitus, difficulty speaking, facial droop, sore throat, abdominal pain, nausea/vomiting/diarrhea, dysuria, or weakness/numbness/paresthesias in any extremity   Physical Exam  Triage Vital Signs: ED Triage Vitals  Enc Vitals Group     BP 11/30/22 1425 123/88     Pulse Rate 11/30/22 1425 (!) 107     Resp 11/30/22 1425 18     Temp 11/30/22 1425 98.6 F (37 C)     Temp Source 11/30/22 1425 Oral     SpO2 11/30/22 1425 98 %     Weight 11/30/22 1432 255 lb 15.3 oz (116.1 kg)     Height 11/30/22 1432 5\' 7"  (1.702 m)     Head Circumference --      Peak Flow --      Pain Score 11/30/22 1432 6     Pain Loc --      Pain Edu? --      Excl. in [place]? --    Most recent vital signs: Vitals:   11/30/22 1425  BP: 123/88  Pulse: (!) 107  Resp: 18  Temp: 98.6 F (37 C)  SpO2: 98%   General: Awake, oriented x4. CV:  Good peripheral perfusion.  Resp:  Normal effort.  Clear to auscultation bilaterally abd:  No distention.  Other:  Middle-aged obese African-American female sitting in  chair in exam room in no acute distress ED Results / Procedures / Treatments  Labs (all labs ordered are listed, but only abnormal results are displayed) Labs Reviewed  BASIC METABOLIC PANEL - Abnormal; Notable for the following components:      Result Value   Glucose, Bld 109 (*)    All other components within normal limits  CBC - Abnormal; Notable for the following components:   Hemoglobin 11.3 (*)    HCT 35.8 (*)    All other components within normal limits  TROPONIN I (HIGH SENSITIVITY)   EKG ED ECG REPORT I, Naaman Plummer, the attending physician, personally viewed and interpreted this ECG. Date: 11/30/2022 EKG Time: 1423 Rate: 104 Rhythm: normal sinus rhythm QRS Axis: normal Intervals: normal ST/T Wave abnormalities: normal Narrative Interpretation: no evidence of acute ischemia RADIOLOGY ED MD interpretation: 2 view chest x-ray interpreted by me shows no evidence of acute abnormalities including no pneumonia, pneumothorax, or widened mediastinum -Agree with radiology assessment Official radiology report(s): DG Chest 2 View  Result Date: 11/30/2022 CLINICAL DATA:  Chest discomfort and tightness. EXAM: CHEST - 2 VIEW COMPARISON:  04/21/2022. FINDINGS: Normal heart, mediastinum and hila. Clear lungs.  No  pleural effusion or pneumothorax. Skeletal structures are within normal limits. IMPRESSION: Normal chest radiographs. Electronically Signed   By: Lajean Manes M.D.   On: 11/30/2022 14:58   PROCEDURES: Critical Care performed: No .1-3 Lead EKG Interpretation  Performed by: Naaman Plummer, MD Authorized by: Naaman Plummer, MD     Interpretation: normal     ECG rate:  71   ECG rate assessment: normal     Rhythm: sinus rhythm     Ectopy: none     Conduction: normal    MEDICATIONS ORDERED IN ED: Medications - No data to display IMPRESSION / MDM / [place] / ED COURSE  I reviewed the triage vital signs and the nursing notes.                              The patient is on the cardiac monitor to evaluate for evidence of arrhythmia and/or significant heart rate changes. Patient's presentation is most consistent with acute presentation with potential threat to life or bodily function. 37 year old-year-old female who presents for chest tightness and shortness of breath Workup: ECG, CXR, CBC, BMP, Troponin Findings: ECG: No overt evidence of STEMI. No evidence of Brugadas sign, delta wave, epsilon wave, significantly prolonged QTc, or malignant arrhythmia HS Troponin: Negative x1 Other Labs unremarkable for emergent problems. CXR: Without PTX, PNA, or widened mediastinum Last Stress Test: Never Last Heart Catheterization: Never HEART Score: 2  Given History, Exam, and Workup I have low suspicion for ACS, Pneumothorax, Pneumonia, Pulmonary Embolus, Tamponade, Aortic Dissection or other emergent problem as a cause for this presentation.   Reassesment: Prior to discharge patients pain was controlled and they were well appearing.  Disposition:  Discharge. Strict return precautions discussed with patient with full understanding. Advised patient to follow up promptly with primary care provider    FINAL CLINICAL IMPRESSION(S) / ED DIAGNOSES   Final diagnoses:  Chest tightness   Rx / DC Orders   ED Discharge Orders          Ordered    albuterol (PROVENTIL) (2.5 MG/3ML) 0.083% nebulizer solution  Every 4 hours PRN        11/30/22 1523    Nebulizers (COMPRESSOR/NEBULIZER) MISC   Once        11/30/22 1523           Note:  This document was prepared using Dragon voice recognition software and may include unintentional dictation errors.   Naaman Plummer, MD 11/30/22 [phone]

## 2022-12-01 ENCOUNTER — Ambulatory Visit (INDEPENDENT_AMBULATORY_CARE_PROVIDER_SITE_OTHER): Payer: Medicaid Other | Admitting: Licensed Clinical Social Worker

## 2022-12-01 DIAGNOSIS — F411 Generalized anxiety disorder: Secondary | ICD-10-CM | POA: Diagnosis not present

## 2022-12-01 DIAGNOSIS — F4310 Post-traumatic stress disorder, unspecified: Secondary | ICD-10-CM | POA: Diagnosis not present

## 2022-12-02 ENCOUNTER — Telehealth: Payer: Self-pay | Admitting: *Deleted

## 2022-12-02 NOTE — Progress Notes (Signed)
Virtual Visit via Video Note  I connected with Jasmine Gordon on 12/01/22 at  3:00 PM EST by a video enabled telemedicine application and verified that I am speaking with the correct person using two identifiers.  Location: Patient: home Provider: remote office [place], [place])   I discussed the limitations of evaluation and management by telemedicine and the availability of in person appointments. The patient expressed understanding and agreed to proceed.   I discussed the assessment and treatment plan with the patient. The patient was provided an opportunity to ask questions and all were answered. The patient agreed with the plan and demonstrated an understanding of the instructions.   The patient was advised to call back or seek an in-person evaluation if the symptoms worsen or if the condition fails to improve as anticipated.  I provided 60 minutes of non-face-to-face time during this encounter.   Astrid Vides R Tuyen Uncapher, LCSW   THERAPIST PROGRESS NOTE  Session Time: 3-4p  Participation Level: Active  Behavioral Response: Neat and Well GroomedAlertAnxious  Type of Therapy: Individual Therapy  Treatment Goals addressed:Reduce the negative impact trauma related symptoms have on social, occupational, and family functioning per pt self report 3 out of 5 sessions documented    ProgressTowards Goals: Progressing  Interventions: CBT  Summary: Jasmine Gordon is a 37 y.o. female who presents with improving symptoms related to PTSD symptoms. Pt feels mood is stable and that she is getting good quality and quantity of sleep.  Allowed pt to explore and express thoughts and feelings associated with recent life situations and external stressors. Patient reports that she is continuing to experience stress associated with her children, more specifically one of her children that is having some behavioral concerns at time of assessment. Patient reports that she feels that she is  heading to having to put forth a lot of energy towards this child, and keeping him "on track" . Patient reports that she feels that her son is not respectful to her, and does not listen when she expresses her concerns to him.  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 Discuss coping skills that patient can use to manage her anxieties, stress, and depression. Patient reflects understanding.  Listened reflectively and provided pt with  constructive input and support.  Continued recommendations are as follows: self care behaviors, positive social engagements, focusing on overall work/home/life balance, and focusing on positive physical and emotional wellness.   Suicidal/Homicidal: No  Therapist Response: The ongoing treatment plan includes: therapeutic work on building and maintaining self-care; addressing problematic coping strategies and mechanisms; and helping the patient gain greater awareness, understanding, and expression of underlying emotions. Treatment continues to show good evolution and development. Treatment to continue as indicated.  Plan: Return again in 4 weeks.  Diagnosis:  Encounter Diagnoses  Name Primary?   PTSD (post-traumatic stress disorder) Yes   GAD (generalized anxiety disorder)    Collaboration of Care: Other Pt to continue care with psychiatrist of record, Dr. Modesta Messing  Patient/Guardian was advised Release of Information must be obtained prior to any record release in order to collaborate their care with an outside provider. Patient/Guardian was advised if they have not already done so to contact the registration department to sign all necessary forms in order for [place] to release information regarding their care.   Consent: Patient/Guardian gives verbal consent for treatment and assignment of benefits  for services provided  during this visit. Patient/Guardian expressed understanding and agreed to proceed.   [place], LCSW 12/02/2022

## 2022-12-02 NOTE — Patient Outreach (Signed)
  Care Coordination [facility] Note Transition Care Management Follow-up Telephone Call Date of discharge and from where: 11/30/22 from [facility] ED How have you been since you were released from the hospital? "I'm doing ok" Any questions or concerns? No  Items Reviewed: Did the pt receive and understand the discharge instructions provided? Yes  Medications obtained and verified?  No medications ordered Other? No  Any new allergies since your discharge? No  Dietary orders reviewed? No Do you have support at home? Yes   Home Care and Equipment/Supplies: Were home health services ordered? no If so, what is the name of the agency? N/A  Has the agency set up a time to come to the patient's home? not applicable Were any new equipment or medical supplies ordered?  No What is the name of the medical supply agency? N/A Were you able to get the supplies/equipment? not applicable Do you have any questions related to the use of the equipment or supplies? No  Functional Questionnaire: (I = Independent and D = Dependent) ADLs: I  Bathing/Dressing- I  Meal Prep- I  Eating- I  Maintaining continence- I  Transferring/Ambulation- I  Managing Meds- I  Follow up appointments reviewed:  PCP Hospital f/u appt confirmed?  N/A, ED visit  [facility]u appt confirmed?  N/A, ED visit   Are transportation arrangements needed? No  If their condition worsens, is the pt aware to call PCP or go to the Emergency Dept.? Yes Was the patient provided with contact information for the PCP's office or ED? No Was to pt encouraged to call back with questions or concerns? Yes  SDOH assessments and interventions completed:   Yes   Lurena Joiner RN, [place] RN Care Coordinator
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
## 2022-12-06 ENCOUNTER — Encounter: Payer: Self-pay | Admitting: Psychiatry

## 2022-12-06 ENCOUNTER — Telehealth (INDEPENDENT_AMBULATORY_CARE_PROVIDER_SITE_OTHER): Payer: Medicaid Other | Admitting: Psychiatry

## 2022-12-06 ENCOUNTER — Other Ambulatory Visit: Payer: Medicaid Other | Admitting: Obstetrics and Gynecology

## 2022-12-06 ENCOUNTER — Encounter: Payer: Self-pay | Admitting: Obstetrics and Gynecology

## 2022-12-06 DIAGNOSIS — F4310 Post-traumatic stress disorder, unspecified: Secondary | ICD-10-CM

## 2022-12-06 DIAGNOSIS — F410 Panic disorder [episodic paroxysmal anxiety] without agoraphobia: Secondary | ICD-10-CM | POA: Diagnosis not present

## 2022-12-06 DIAGNOSIS — F3341 Major depressive disorder, recurrent, in partial remission: Secondary | ICD-10-CM

## 2022-12-06 DIAGNOSIS — F411 Generalized anxiety disorder: Secondary | ICD-10-CM | POA: Diagnosis not present

## 2022-12-06 MED ORDER — ESCITALOPRAM OXALATE 10 MG PO TABS: 10.0000 mg | ORAL_TABLET | Freq: Every day | ORAL | 2 refills | Status: DC

## 2022-12-06 NOTE — Patient Instructions (Signed)
Continue lexapro 10 mg daily  Continue hydroxyzine 25 mg daily as needed for anxiety  Refered for evaluation of sleep apnea Next appointment: 3/18 at 3:30,

## 2022-12-06 NOTE — Patient Outreach (Signed)
Medicaid Managed Care   Nurse Care Manager Note  12/06/2022 Name:  Jasmine Gordon MRN:  [id number] DOB:  06/20/1986  Jasmine Gordon is an 37 y.o. year old female who is a primary patient of [name], DO.  The [facility] Managed Care Coordination team was consulted for assistance with:    Chronic healthcare management needs, asthma, anxiety/depression/PTSD, HLD  Jasmine Gordon was given information about Medicaid Managed Care Coordination team services today. Jasmine Gordon Patient agreed to services and verbal consent obtained.  Engaged with patient by telephone for follow up visit in response to provider referral for case management and/or care coordination services.   Assessments/Interventions:  Review of past medical history, allergies, medications, health status, including review of consultants reports, laboratory and other test data, was performed as part of comprehensive evaluation and provision of chronic care management services.  SDOH (Social Determinants of Health) assessments and interventions performed: SDOH Interventions    Flowsheet Row Telephone from 12/02/2022 in [place] POPULATION [facility]ALTH DEPARTMENT Patient Outreach Telephone from 11/01/2022 in [place] POPULATION HEALTH DEPARTMENT Patient Outreach Telephone from 09/29/2022 in [place] POPULATION HEALTH DEPARTMENT Office Visit from 09/16/2022 in [facility] Patient Outreach Telephone from 08/31/2022 in [facility] Care Coordination Patient Outreach Telephone from 08/17/2022 in [facility] Care Coordination  SDOH Interventions        Food Insecurity Interventions -- -- Intervention Not Indicated -- -- --  Housing Interventions -- -- -- -- Intervention Not Indicated --  Transportation Interventions Intervention Not Indicated -- -- -- -- --  Alcohol Usage Interventions -- -- Intervention Not Indicated (Score <7) -- -- --   Depression Interventions/Treatment  -- -- -- Medication -- --  Financial Strain Interventions -- Intervention Not Indicated -- -- -- --  Physical Activity Interventions -- Intervention Not Indicated -- -- -- --  Stress Interventions -- -- -- -- -- Provide Counseling, Offered [employer] Resources     Care Plan  Allergies  Allergen Reactions   Reglan [Metoclopramide] Hives and Shortness Of Breath   Contrave [Naltrexone-Bupropion Hcl Er] Other (See Comments)    Had some reaction to the medication   Medications Reviewed Today     Reviewed by [name], RN (Registered Nurse) on 12/06/22 at 1046  Med List Status: <None>   Medication Order Taking? Sig Documenting Provider Last Dose Status Informant  albuterol (PROVENTIL) (2.5 MG/3ML) 0.083% nebulizer solution [id number]  Take 3 mLs (2.5 mg total) by nebulization every 4 (four) hours as needed for wheezing or shortness of breath. Merwyn Katos, MD  Active   albuterol (VENTOLIN HFA) 108 (90 Base) MCG/ACT inhaler [id number] No Inhale 2 puffs into the lungs every 6 (six) hours as needed for wheezing or shortness of breath. [name], DO Taking Active   cetirizine (ZYRTEC) 10 MG tablet [id number] No Take 10 mg by mouth daily as needed for allergies. [provider] Taking Active   chlorhexidine (PERIDEX) 0.12 % solution [id number] No Use as directed 10 mLs in the mouth or throat. [provider] Taking Active   dicyclomine (BENTYL) 10 MG capsule [id number] No Take 1 capsule (10 mg total) by mouth 4 (four) times daily -  before meals and at bedtime. Delton Prairie, MD Taking Active   escitalopram (LEXAPRO) 10 MG tablet [id number] No 5 mg daily for one week, then 10 mg daily Hisada, Barbee Cough, MD Taking Active   fluticasone (FLONASE) 50 MCG/ACT nasal spray [id number] No  Place 1 spray into both nostrils daily. Use for 4-6 weeks then stop and use seasonally or as needed. Jearld Fenton, NP Taking Active   hydrOXYzine  (ATARAX) 10 MG tablet [id number] No Take 1 tablet (10 mg total) by mouth 3 (three) times daily as needed for anxiety. [name], DO Taking Active   ibuprofen (ADVIL) 800 MG tablet [id number] No Take 1 tablet (800 mg total) by mouth every 8 (eight) hours as needed. Johnn Hai, PA-C Taking Active   methocarbamol (ROBAXIN) 500 MG tablet [id number] No Take 1-2 tablets every 6 hours prn muscle spasms Johnn Hai, PA-C Taking Active   metoprolol tartrate (LOPRESSOR) 25 MG tablet [id number] No Take 0.5 tablets (12.5 mg total) by mouth 2 (two) times daily. Furth, Cadence H, PA-C Taking Expired 08/16/22 2359   montelukast (SINGULAIR) 10 MG tablet [id number] No Take 10 mg by mouth at bedtime as needed. [provider] Taking Active   Multiple Vitamins-Minerals (ONE-A-DAY WOMENS PO) [id number] No Take 1 tablet by mouth daily. [provider] Taking Active   OVER THE COUNTER MEDICATION [id number] No Goli ashwaganda gummy 1/2 daily [provider] Taking Active   pantoprazole (PROTONIX) 40 MG tablet [id number] No Take 1 tablet (40 mg total) by mouth daily. Kathlen Mody, Cadence H, PA-C Taking Active   Spacer/Aero-Holding Chambers (AEROCHAMBER MV) inhaler [id number] No Use as instructed [name], DO Taking Active   Vitamin D, Ergocalciferol, (DRISDOL) 1.25 MG (50000 UNIT) CAPS capsule [id number] No Take 1 capsule (50,000 Units total) by mouth every 7 (seven) days. [name], DO Taking Active            Patient Active Problem List   Diagnosis Date Noted   Vitamin B12 deficiency 06/09/2022   Palpitations 01/21/2022   Shortness of breath 01/21/2022   Chest pain 01/21/2022   Generalized anxiety disorder with panic attacks 09/27/2021   Environmental and seasonal allergies 11/04/2020   Genital warts 09/11/2018   Positive TB test 09/25/2017   Allergic contact dermatitis due to metals 05/17/2017   Pre-diabetes 03/06/2017   Hyperlipidemia  03/06/2017   Allergic reaction 02/08/2017   Urticaria due to food allergy 02/08/2017   Vitamin D deficiency 11/08/2016   Iron deficiency anemia due to chronic blood loss 10/03/2016   Menorrhagia with regular cycle 06/27/2016   Mild persistent asthma without complication 33/54/5625   Anxiety and depression 06/17/2016   Morbid obesity ([place]) 06/17/2016   PTSD (post-traumatic stress disorder) 2016   Conditions to be addressed/monitored per PCP order:  Chronic healthcare management needs, asthma, anxiety/depression/PTSD, HLD  Care Plan : RN Care Manager Plan of Care  Updates made by Gayla Medicus, RN since 12/06/2022 12:00 AM     Problem: Health Promotion or Disease Self-Management (General Plan of Care)      Long-Range Goal: Chronic Disease Management and Care Coordination Needs   Expected End Date: 03/07/2023  Priority: High  Note:   Current Barriers:  Knowledge Deficits related to plan of care for management of Asthma, anxiety/depression/PTSD, HLD  Chronic Disease Management support and education needs related to Asthma, anxiety/depression/PTSD, HLD 12/06/22:  Patient with no complaints today-still needs sleep study.  Seen in ED 12/31 for headache and 1/10 for chest tightness.  Denies any problems today.  RNCM Clinical Goal(s):  Patient will verbalize understanding of plan for management of Asthma, anxiety as evidenced by patient report verbalize basic understanding of  Asthma disease process and self health management plan as evidenced by patient report  take all medications exactly as prescribed and will call provider for medication related questions as evidenced by patient report demonstrate understanding of rationale for each prescribed medication as evidenced by patient report attend all scheduled medical appointments: as evidenced by patient report continue to work with RN Care Manager to address care management and care coordination needs related to  Asthma as evidenced by  adherence to CM Team Scheduled appointments work with social worker to address  related to the management of Mental Health Concerns  related to the management of Anxiety, Depression, and PTSD, panic attacks as evidenced by review of EMR and patient or social worker report  Interventions: Inter-disciplinary care team collaboration (see longitudinal plan of care) Evaluation of current treatment plan related to  self management and patient's adherence to plan as established by provider  Hyperlipidemia Interventions:  (Status:  New goal.) Long Term Goal Medication review performed; medication list updated in electronic medical record.  Counseled on importance of regular laboratory monitoring as prescribed Assessed social determinant of health barriers    Asthma: (Status:New goal.) Long Term Goal Advised patient to track and manage Asthma triggers Advised patient to self assesses Asthma action plan zone and make appointment with provider if in the yellow zone for 48 hours without improvement Discussed the importance of adequate rest and management of fatigue with Asthma Assessed social determinant of health barriers  Collaborated with SW SW referral for anxiety/depression/panic attacks-completed  Patient Goals/Self-Care Activities: Take all medications as prescribed Attend all scheduled provider appointments Call pharmacy for medication refills 3-7 days in advance of running out of medications Perform all self care activities independently  Perform IADL's (shopping, preparing meals, housekeeping, managing finances) independently Call provider office for new concerns or questions  Work with the social worker to address care coordination needs and will continue to work with the clinical team to address health care and disease management related needs Patient to contact Pharmacy and/or Provider regarding medication Patient to schedule sleep study  Follow Up Plan:  The patient has been provided  with contact information for the care management team and has been advised to call with any health related questions or concerns.  The care management team will reach out to the patient again over the next 30 business  days.    Long-Range Goal: Establish Plan of Care for Chronic Disease Management Needs   Priority: High  Note:   Timeframe:  Long-Range Goal Priority:  High Start Date:   03/07/22                          Expected End Date:     ongoing                  Follow Up Date 01/03/23   - schedule appointment for flu shot - schedule appointment for vaccines needed due to my age or health - schedule recommended health tests (blood work, mammogram, colonoscopy, pap test) - schedule and keep appointment for annual check-up    Why is this important?   Screening tests can find diseases early when they are easier to treat.  Your doctor or nurse will talk with you about which tests are important for you.  Getting shots for common diseases like the flu and shingles will help prevent them.  12/06/22:  Patient has upcoming appts for labwork and appt with Dr. Tasia Catchings.  To have sleep study.   Follow Up:  Patient agrees to Care Plan and Follow-up.  Plan: The Managed Medicaid care management team will reach out to the patient again over the next 30 business  days. and The  Patient has been provided with contact information for the Managed Medicaid care management team and has been advised to call with any health related questions or concerns.  Date/time of next scheduled RN care management/care coordination outreach:  01/03/23 at 1030.

## 2022-12-06 NOTE — Patient Instructions (Signed)
Hi [name] enjoyed speaking with you this morning-I hope you feel better-stay warm!!  [name] was given information about Medicaid Managed Care team care coordination services as a part of their Healthy [facility] benefit. Jasmine Gordon verbally consented to engagement with the [facility] Managed Care team.   If you are experiencing a medical emergency, please call 911 or report to your local emergency department or urgent care.   If you have a non-emergency medical problem during routine business hours, please contact your provider's office and ask to speak with a nurse.   For questions related to your Healthy [facility] health plan, please call: [phone] or visit the homepage here: [email or web address]  If you would like to schedule transportation through your Healthy [facility] plan, please call the following number at least 2 days in advance of your appointment: [phone]  For information about your ride after you set it up, call Ride Assist at [phone]. Use this number to activate a Will Call pickup, or if your transportation is late for a scheduled pickup. Use this number, too, if you need to make a change or cancel a previously scheduled reservation.  If you need transportation services right away, call [phone]. The after-hours call center is staffed 24 hours to handle ride assistance and urgent reservation requests (including discharges) 365 days a year. Urgent trips include sick visits, hospital discharge requests and life-sustaining treatment.  Call the [place] at [phone], at any time, 24 hours a day, 7 days a week. If you are in danger or need immediate medical attention call 911.  If you would like help to quit smoking, call 1-800-QUIT-NOW 516 107 7156) OR Espaol: 1-855-Djelo-Ya (1-749-449-6759) o para ms informacin haga clic aqu or Text READY to 200-400 to register via  text  [name] following are the goals we discussed in your visit today:   Goals Addressed    Timeframe:  Long-Range Goal Priority:  High Start Date:   03/07/22                          Expected End Date:     ongoing                  Follow Up Date 01/03/23   - schedule appointment for flu shot - schedule appointment for vaccines needed due to my age or health - schedule recommended health tests (blood work, mammogram, colonoscopy, pap test) - schedule and keep appointment for annual check-up    Why is this important?   Screening tests can find diseases early when they are easier to treat.  Your doctor or nurse will talk with you about which tests are important for you.  Getting shots for common diseases like the flu and shingles will help prevent them.  12/06/22:  Patient has upcoming appts for labwork and appt with Dr. Tasia Catchings.  To have sleep study.  Patient verbalizes understanding of instructions and care plan provided today and agrees to view in [place]. Active MyChart status and patient understanding of how to access instructions and care plan via MyChart confirmed with patient.     The Managed Medicaid care management team will reach out to the patient again over the next 30 business  days.  The  Patient  has been provided with contact information for the Managed Medicaid care management team and has been advised to call with any health related questions or concerns.  Jasmine Raider RN, BSN [place] Management Coordinator - Managed Medicaid High Risk [phone]   Following is a copy of your plan of care:  Care Plan : [place] of Care  Updates made by Gayla Medicus, RN since 12/06/2022 12:00 AM     Problem: Health Promotion or Disease Self-Management (General Plan of Care)      Long-Range Goal: Chronic Disease Management and Care Coordination Needs   Expected End Date: 03/07/2023  Priority: High  Note:   Current  Barriers:  Knowledge Deficits related to plan of care for management of Asthma, anxiety/depression/PTSD, HLD  Chronic Disease Management support and education needs related to Asthma, anxiety/depression/PTSD, HLD 12/06/22:  Patient with no complaints today-still needs sleep study.  Seen in ED 12/31 for headache and 1/10 for chest tightness.  Denies any problems today.  RNCM Clinical Goal(s):  Patient will verbalize understanding of plan for management of Asthma, anxiety as evidenced by patient report verbalize basic understanding of  Asthma disease process and self health management plan as evidenced by patient report take all medications exactly as prescribed and will call provider for medication related questions as evidenced by patient report demonstrate understanding of rationale for each prescribed medication as evidenced by patient report attend all scheduled medical appointments: as evidenced by patient report continue to work with RN Care Manager to address care management and care coordination needs related to  Asthma as evidenced by adherence to CM Team Scheduled appointments work with Education officer, museum to address  related to the management of Mental Health Concerns  related to the management of Anxiety, Depression, and PTSD, panic attacks as evidenced by review of EMR and patient or social worker report  Interventions: Inter-disciplinary care team collaboration (see longitudinal plan of care) Evaluation of current treatment plan related to  self management and patient's adherence to plan as established by provider  Hyperlipidemia Interventions:  (Status:  New goal.) Long Term Goal Medication review performed; medication list updated in electronic medical record.  Counseled on importance of regular laboratory monitoring as prescribed Assessed social determinant of health barriers    Asthma: (Status:New goal.) Long Term Goal Advised patient to track and manage Asthma triggers Advised  patient to self assesses Asthma action plan zone and make appointment with provider if in the yellow zone for 48 hours without improvement Discussed the importance of adequate rest and management of fatigue with Asthma Assessed social determinant of health barriers  Collaborated with SW SW referral for anxiety/depression/panic attacks-completed  Patient Goals/Self-Care Activities: Take all medications as prescribed Attend all scheduled provider appointments Call pharmacy for medication refills 3-7 days in advance of running out of medications Perform all self care activities independently  Perform IADL's (shopping, preparing meals, housekeeping, managing finances) independently Call provider office for new concerns or questions  Work with the social worker to address care coordination needs and will continue to work with the clinical team to address health care and disease management related needs Patient to contact Pharmacy and/or Provider regarding medication Patient to schedule sleep study  Follow Up Plan:  The patient has been provided with contact information for the care management team and has been advised to call with any health related questions or concerns.  The care management team will reach out to the patient again over the next 30 business  days.

## 2022-12-12 ENCOUNTER — Inpatient Hospital Stay: Payer: Managed Care, Other (non HMO)

## 2022-12-12 ENCOUNTER — Inpatient Hospital Stay: Payer: Managed Care, Other (non HMO) | Attending: Oncology

## 2022-12-12 DIAGNOSIS — D500 Iron deficiency anemia secondary to blood loss (chronic): Secondary | ICD-10-CM | POA: Diagnosis present

## 2022-12-12 DIAGNOSIS — E538 Deficiency of other specified B group vitamins: Secondary | ICD-10-CM | POA: Insufficient documentation

## 2022-12-12 DIAGNOSIS — Z79899 Other long term (current) drug therapy: Secondary | ICD-10-CM | POA: Insufficient documentation

## 2022-12-12 LAB — CBC WITH DIFFERENTIAL/PLATELET
Abs Immature Granulocytes: 0.02 10*3/uL (ref 0.00–0.07)
Basophils Absolute: 0 10*3/uL (ref 0.0–0.1)
Basophils Relative: 0 %
Eosinophils Absolute: 0.1 10*3/uL (ref 0.0–0.5)
Eosinophils Relative: 1 %
HCT: 35 % — ABNORMAL LOW (ref 36.0–46.0)
Hemoglobin: 11.8 g/dL — ABNORMAL LOW (ref 12.0–15.0)
Immature Granulocytes: 0 %
Lymphocytes Relative: 39 %
Lymphs Abs: 2.2 10*3/uL (ref 0.7–4.0)
MCH: 27 pg (ref 26.0–34.0)
MCHC: 33.7 g/dL (ref 30.0–36.0)
MCV: 80.1 fL (ref 80.0–100.0)
Monocytes Absolute: 0.3 10*3/uL (ref 0.1–1.0)
Monocytes Relative: 5 %
Neutro Abs: 3 10*3/uL (ref 1.7–7.7)
Neutrophils Relative %: 55 %
Platelets: 222 10*3/uL (ref 150–400)
RBC: 4.37 MIL/uL (ref 3.87–5.11)
RDW: 13.2 % (ref 11.5–15.5)
WBC: 5.6 10*3/uL (ref 4.0–10.5)
nRBC: 0 % (ref 0.0–0.2)

## 2022-12-12 LAB — IRON AND TIBC
Iron: 59 ug/dL (ref 28–170)
Saturation Ratios: 16 % (ref 10.4–31.8)
TIBC: 365 ug/dL (ref 250–450)
UIBC: 306 ug/dL

## 2022-12-12 LAB — VITAMIN B12: Vitamin B-12: 212 pg/mL (ref 180–914)

## 2022-12-12 LAB — FERRITIN: Ferritin: 11 ng/mL (ref 11–307)

## 2022-12-14 MED FILL — Iron Sucrose Inj 20 MG/ML (Fe Equiv): INTRAVENOUS | Qty: 10 | Status: AC

## 2022-12-15 ENCOUNTER — Inpatient Hospital Stay (HOSPITAL_BASED_OUTPATIENT_CLINIC_OR_DEPARTMENT_OTHER): Payer: Managed Care, Other (non HMO) | Admitting: Oncology

## 2022-12-15 ENCOUNTER — Encounter: Payer: Self-pay | Admitting: Oncology

## 2022-12-15 ENCOUNTER — Inpatient Hospital Stay: Payer: Managed Care, Other (non HMO)

## 2022-12-15 VITALS — BP 133/99

## 2022-12-15 VITALS — BP 143/92 | HR 100 | Temp 98.2°F | Wt 259.0 lb

## 2022-12-15 DIAGNOSIS — D500 Iron deficiency anemia secondary to blood loss (chronic): Secondary | ICD-10-CM

## 2022-12-15 DIAGNOSIS — E538 Deficiency of other specified B group vitamins: Secondary | ICD-10-CM | POA: Diagnosis not present

## 2022-12-15 MED ORDER — SODIUM CHLORIDE 0.9 % IV SOLN
200.0000 mg | Freq: Once | INTRAVENOUS | Status: AC
  Administered 2022-12-15: 200 mg via INTRAVENOUS
  Filled 2022-12-15: qty 10

## 2022-12-15 MED ORDER — SODIUM CHLORIDE 0.9 % IV SOLN
INTRAVENOUS | Status: DC
  Filled 2022-12-15: qty 250

## 2022-12-15 NOTE — Progress Notes (Signed)
Hematology/Oncology Progress note [phone] [phone]    Chief complaints: Follow up for iron deficiency anemia.  Assessment & Plan: Vitamin B12 deficiency Not able to tolerate  Vitamin B12 - she prefers  multi- vitamins.   Iron deficiency anemia due to chronic blood loss Labs are reviewed and discussed with patient. Ferritin has decreased to 11. Recommend weekly Venofer x 2.  Patient is currently on menstrual period, denies any chance of pregnancy.    The patient understands the plans discussed today and is in agreement with them.  She knows to contact our office if she develops concerns prior to her next appointment.  Follow up in 6 months.  [name], MD  Orders Placed This Encounter  Procedures   CBC with Differential/Platelet    Standing Status:   Future    Standing Expiration Date:   12/15/2023   Ferritin    Standing Status:   Future    Standing Expiration Date:   12/16/2023   Iron and TIBC([facility])    Standing Status:   Future    Standing Expiration Date:   12/16/2023   Vitamin B12    Standing Status:   Future    Standing Expiration Date:   12/16/2023        INTERVAL HISTORY Jasmine Gordon is a 37 y.o. female who has above history reviewed by me today presents for follow up visit for IDA and B12 deficiency  She reports feeling well.  She takes iron contained multi-vitamin. She does not like B12 supplementation.   Review of Systems  Constitutional:  Negative for appetite change, chills, fatigue and fever.  HENT:   Negative for hearing loss and voice change.   Eyes:  Negative for eye problems.  Respiratory:  Negative for chest tightness and cough.   Cardiovascular:  Negative for chest pain.  Gastrointestinal:  Negative for abdominal distention, abdominal pain and blood in stool.  Endocrine: Negative for hot flashes.  Genitourinary:  Negative for difficulty urinating and frequency.   Musculoskeletal:  Negative for  arthralgias.  Skin:  Negative for itching and rash.  Neurological:  Negative for extremity weakness.  Hematological:  Negative for adenopathy.  Psychiatric/Behavioral:  Negative for confusion.     VItal Blood pressure (!) 143/92, pulse 100, temperature 98.2 F (36.8 C), temperature source Tympanic, weight 259 lb (117.5 kg), last menstrual period 11/17/2022, SpO2 100 %.  Wt Readings from Last 3 Encounters:  12/15/22 259 lb (117.5 kg)  11/30/22 255 lb 15.3 oz (116.1 kg)  11/20/22 256 lb (116.1 kg)    Body mass index is 40.57 kg/m.  Physical Exam Constitutional:      General: She is not in acute distress.    Appearance: She is not diaphoretic.  HENT:     Head: Normocephalic and atraumatic.     Nose: Nose normal.     Mouth/Throat:     Pharynx: No oropharyngeal exudate.  Eyes:     General: No scleral icterus.    Pupils: Pupils are equal, round, and reactive to light.  Cardiovascular:     Rate and Rhythm: Normal rate and regular rhythm.     Heart sounds: No murmur heard. Pulmonary:     Effort: Pulmonary effort is normal. No respiratory distress.     Breath sounds: No rales.  Chest:     Chest wall: No tenderness.  Abdominal:     General: There is no distension.     Palpations: Abdomen is soft.     Tenderness: There  is no abdominal tenderness.  Musculoskeletal:        General: Normal range of motion.     Cervical back: Normal range of motion and neck supple.  Skin:    General: Skin is warm and dry.     Findings: No erythema.  Neurological:     Mental Status: She is alert and oriented to person, place, and time.     Cranial Nerves: No cranial nerve deficit.     Motor: No abnormal muscle tone.     Coordination: Coordination normal.  Psychiatric:        Mood and Affect: Affect normal.     Labs     Latest Ref Rng & Units 12/12/2022    1:54 PM 11/30/2022    2:33 PM 09/08/2022   11:03 PM  CBC  WBC 4.0 - 10.5 K/uL 5.6  4.7  5.9   Hemoglobin 12.0 - 15.0 g/dL 11.8  11.3   11.4   Hematocrit 36.0 - 46.0 % 35.0  35.8  37.5   Platelets 150 - 400 K/uL 222  234  243       Latest Ref Rng & Units 11/30/2022    2:33 PM 09/16/2022    1:46 PM 09/08/2022   11:03 PM  CMP  Glucose 70 - 99 mg/dL 109  85  105   BUN 6 - 20 mg/dL 12  11  12    Creatinine 0.44 - 1.00 mg/dL 0.73  0.73  0.77   Sodium 135 - 145 mmol/L 137  138  140   Potassium 3.5 - 5.1 mmol/L 3.5  4.1  3.5   Chloride 98 - 111 mmol/L 107  105  109   CO2 22 - 32 mmol/L 23  24  24    Calcium 8.9 - 10.3 mg/dL 9.0  9.6  9.4   Total Protein 6.1 - 8.1 g/dL  7.0  7.3   Total Bilirubin 0.2 - 1.2 mg/dL  0.3  0.3   Alkaline Phos 38 - 126 U/L   55   AST 10 - 30 U/L  13  18   ALT 6 - 29 U/L  7  10     Lab Results  Component Value Date   TIBC 365 12/12/2022   TIBC 349 06/08/2022   TIBC 333 12/01/2021   FERRITIN 11 12/12/2022   FERRITIN 28 06/08/2022   FERRITIN 18 12/01/2021   IRONPCTSAT 16 12/12/2022   IRONPCTSAT 16 06/08/2022   IRONPCTSAT 18 12/01/2021     STUDIES:  DG Chest 2 View  Result Date: 11/30/2022 CLINICAL DATA:  Chest discomfort and tightness. EXAM: CHEST - 2 VIEW COMPARISON:  04/21/2022. FINDINGS: Normal heart, mediastinum and hila. Clear lungs.  No pleural effusion or pneumothorax. Skeletal structures are within normal limits. IMPRESSION: Normal chest radiographs. Electronically Signed   By: Lajean Manes M.D.   On: 11/30/2022 14:58   CT HEAD WO CONTRAST (5MM)  Result Date: 11/21/2022 CLINICAL DATA:  Headaches EXAM: CT HEAD WITHOUT CONTRAST TECHNIQUE: Contiguous axial images were obtained from the base of the skull through the vertex without intravenous contrast. RADIATION DOSE REDUCTION: This exam was performed according to the departmental dose-optimization program which includes automated exposure control, adjustment of the mA and/or kV according to patient size and/or use of iterative reconstruction technique. COMPARISON:  06/07/2022 FINDINGS: Brain: No evidence of acute infarction,  hemorrhage, hydrocephalus, extra-axial collection or mass lesion/mass effect. Vascular: No hyperdense vessel or unexpected calcification. Skull: Normal. Negative for fracture or focal lesion. Sinuses/Orbits: No  acute finding. Other: None. IMPRESSION: No acute intracranial abnormality noted. Electronically Signed   By: Alcide Clever M.D.   On: 11/21/2022 00:19      HISTORY:   Past Medical History:  Diagnosis Date   Allergy    Anemia    Anxiety    Asthma    Migraine    Obese    Panic attack    PTSD (post-traumatic stress disorder) 2016    Past Surgical History:  Procedure Laterality Date   APPENDECTOMY     LEEP  12/2020   CIN II, negative margins   Plantar warts     WISDOM TOOTH EXTRACTION      Family History  Problem Relation Age of Onset   Alcohol abuse Mother    Drug abuse Mother    Diabetes Mother    Depression Mother    Mental retardation Mother    Bipolar disorder Mother    Heart disease Father    Diabetes Father    Heart failure Sister    Stroke Maternal Grandfather    Diabetes Maternal Grandfather     Social History:  reports that she has never smoked. She has never used smokeless tobacco. She reports that she does not currently use alcohol. She reports that she does not use drugs.  Allergies:  Allergies  Allergen Reactions   Reglan [Metoclopramide] Hives and Shortness Of Breath   Contrave [Naltrexone-Bupropion Hcl Er] Other (See Comments)    Had some reaction to the medication    Current Medications: Current Outpatient Medications  Medication Sig Dispense Refill   albuterol (PROVENTIL) (2.5 MG/3ML) 0.083% nebulizer solution Take 3 mLs (2.5 mg total) by nebulization every 4 (four) hours as needed for wheezing or shortness of breath. 75 mL 2   albuterol (VENTOLIN HFA) 108 (90 Base) MCG/ACT inhaler Inhale 2 puffs into the lungs every 6 (six) hours as needed for wheezing or shortness of breath. 8 g 3   cetirizine (ZYRTEC) 10 MG tablet Take 10 mg by mouth  daily as needed for allergies.     chlorhexidine (PERIDEX) 0.12 % solution Use as directed 10 mLs in the mouth or throat.     dicyclomine (BENTYL) 10 MG capsule Take 1 capsule (10 mg total) by mouth 4 (four) times daily -  before meals and at bedtime. 60 capsule 1   escitalopram (LEXAPRO) 10 MG tablet Take 1 tablet (10 mg total) by mouth daily. 30 tablet 2   fluticasone (FLONASE) 50 MCG/ACT nasal spray Place 1 spray into both nostrils daily. Use for 4-6 weeks then stop and use seasonally or as needed. 16 g 0   hydrOXYzine (ATARAX) 10 MG tablet Take 1 tablet (10 mg total) by mouth 3 (three) times daily as needed for anxiety. 60 tablet 3   ibuprofen (ADVIL) 800 MG tablet Take 1 tablet (800 mg total) by mouth every 8 (eight) hours as needed. 12 tablet 0   methocarbamol (ROBAXIN) 500 MG tablet Take 1-2 tablets every 6 hours prn muscle spasms 20 tablet 0   metoprolol tartrate (LOPRESSOR) 25 MG tablet Take 0.5 tablets (12.5 mg total) by mouth 2 (two) times daily. 20 tablet 0   montelukast (SINGULAIR) 10 MG tablet Take 10 mg by mouth at bedtime as needed.     Multiple Vitamins-Minerals (ONE-A-DAY WOMENS PO) Take 1 tablet by mouth daily.     OVER THE COUNTER MEDICATION Goli ashwaganda gummy 1/2 daily     pantoprazole (PROTONIX) 40 MG tablet Take 1 tablet (40 mg total)  by mouth daily. 30 tablet 5   Spacer/Aero-Holding Chambers (AEROCHAMBER MV) inhaler Use as instructed 1 each 2   Vitamin D, Ergocalciferol, (DRISDOL) 1.25 MG (50000 UNIT) CAPS capsule Take 1 capsule (50,000 Units total) by mouth every 7 (seven) days. 12 capsule 1   No current facility-administered medications for this visit.   Facility-Administered Medications Ordered in Other Visits  Medication Dose Route Frequency Provider Last Rate Last Admin   0.9 %  sodium chloride infusion   Intravenous Continuous Earlie Server, MD   Stopped at 12/15/22 1557

## 2022-12-15 NOTE — Assessment & Plan Note (Addendum)
Labs are reviewed and discussed with patient. Ferritin has decreased to 11. Recommend weekly Venofer x 2.  Patient is currently on menstrual period, denies any chance of pregnancy.

## 2022-12-15 NOTE — Assessment & Plan Note (Addendum)
Not able to tolerate  Vitamin B12 1072mcg- she prefers  multi- vitamins.

## 2022-12-23 ENCOUNTER — Inpatient Hospital Stay: Payer: Medicaid Other

## 2023-01-03 ENCOUNTER — Other Ambulatory Visit: Payer: Medicaid Other | Admitting: Obstetrics and Gynecology

## 2023-01-03 ENCOUNTER — Ambulatory Visit (INDEPENDENT_AMBULATORY_CARE_PROVIDER_SITE_OTHER): Payer: Medicaid Other | Admitting: Licensed Clinical Social Worker

## 2023-01-03 DIAGNOSIS — F4310 Post-traumatic stress disorder, unspecified: Secondary | ICD-10-CM

## 2023-01-03 NOTE — Patient Outreach (Signed)
  Medicaid Managed Care   Unsuccessful Attempt Note   01/03/2023 Name: Jasmine Gordon MRN: [id number] DOB: 1985/12/29  Referred by: [name], DO Reason for referral : High Risk Managed Medicaid (Unsuccessful telephone outreach)   An unsuccessful telephone outreach was attempted today. The patient was referred to the case management team for assistance with care management and care coordination.    Follow Up Plan: The Managed Medicaid care management team will reach out to the patient again over the next 30 business  days. and The  Patient has been provided with contact information for the Managed Medicaid care management team and has been advised to call with any health related questions or concerns.    Aida Raider RN, BSN   [facility] - Managed Medicaid High Risk [phone]
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
## 2023-01-03 NOTE — Patient Instructions (Signed)
Hi [name], I am so sorry I missed you today-I hope you are well - as a part of your Medicaid benefit, you are eligible for care management and care coordination services at no cost or copay. I was unable to reach you by phone today but would be happy to help you with your health related needs. Please feel free to call me at [phone].  A member of the Managed Medicaid care management team will reach out to you again over the next 30 business  days.   Aida Raider RN, BSN [place]  [facility] - Managed Medicaid High Risk [phone].

## 2023-01-11 ENCOUNTER — Telehealth: Payer: Self-pay | Admitting: Internal Medicine

## 2023-01-11 NOTE — Telephone Encounter (Signed)
Called to schedule HST- transferred to [facility].

## 2023-01-17 ENCOUNTER — Encounter: Payer: Self-pay | Admitting: Hematology and Oncology

## 2023-01-19 ENCOUNTER — Encounter: Payer: Self-pay | Admitting: Hematology and Oncology

## 2023-01-23 ENCOUNTER — Ambulatory Visit: Payer: Medicaid Other

## 2023-01-23 DIAGNOSIS — G4733 Obstructive sleep apnea (adult) (pediatric): Secondary | ICD-10-CM | POA: Diagnosis not present

## 2023-01-23 DIAGNOSIS — G4719 Other hypersomnia: Secondary | ICD-10-CM

## 2023-01-24 DIAGNOSIS — G4733 Obstructive sleep apnea (adult) (pediatric): Secondary | ICD-10-CM | POA: Diagnosis not present

## 2023-02-01 ENCOUNTER — Other Ambulatory Visit: Payer: Medicaid Other | Admitting: Obstetrics and Gynecology

## 2023-02-01 ENCOUNTER — Encounter: Payer: Self-pay | Admitting: Obstetrics and Gynecology

## 2023-02-01 NOTE — Patient Outreach (Signed)
Medicaid Managed Care   Nurse Care Manager Note  02/01/2023 Name:  Jasmine Gordon MRN:  [id number] DOB:  24-Sep-1986  Jasmine Gordon is an 37 y.o. year old female who is a primary patient of [name], DO.  The [facility] Managed Care Coordination team was consulted for assistance with:    Chronic healthcare management needs, asthma, anxiety/depression/PTSD/panic attacks, HLD, anemia, migraines  [name] was given information about Medicaid Managed Care Coordination team services today. [facility] Patient agreed to services and verbal consent obtained.  Engaged with patient by telephone for follow up visit in response to provider referral for case management and/or care coordination services.   Assessments/Interventions:  Review of past medical history, allergies, medications, health status, including review of consultants reports, laboratory and other test data, was performed as part of comprehensive evaluation and provision of chronic care management services.  SDOH (Social Determinants of Health) assessments and interventions performed: SDOH Interventions    Flowsheet Row Patient Outreach Telephone from 02/01/2023 in [place] Telephone from 12/02/2022 in [place] Patient Outreach Telephone from 11/01/2022 in [place] Patient Outreach Telephone from 09/29/2022 in [place] Office Visit from 09/16/2022 in [facility] Patient Outreach Telephone from 08/31/2022 in [place] Coordination  SDOH Interventions        Food Insecurity Interventions -- -- -- Intervention Not Indicated -- --  Housing Interventions -- -- -- -- -- Intervention Not Indicated  Transportation Interventions -- Intervention Not Indicated -- -- -- --  Utilities Interventions Intervention Not Indicated -- --  -- -- --  Alcohol Usage Interventions -- -- -- Intervention Not Indicated (Score <7) -- --  Depression Interventions/Treatment  -- -- -- -- Medication --  Financial Strain Interventions -- -- Intervention Not Indicated -- -- --  Physical Activity Interventions -- -- Intervention Not Indicated -- -- --  Stress Interventions Other (Comment)  [enrolled in counseling] -- -- -- -- --     Care Plan  Allergies  Allergen Reactions   Reglan [Metoclopramide] Hives and Shortness Of Breath   Contrave [Naltrexone-Bupropion Hcl Er] Other (See Comments)    Had some reaction to the medication   Medications Reviewed Today     Reviewed by Jasmine Medicus, RN (Registered Nurse) on 02/01/23 at [facility] Status: <None>   Medication Order Taking? Sig Documenting Provider Last Dose Status Informant  albuterol (PROVENTIL) (2.5 MG/3ML) 0.083% nebulizer solution [id number] No Take 3 mLs (2.5 mg total) by nebulization every 4 (four) hours as needed for wheezing or shortness of breath. Naaman Plummer, MD Taking Active   albuterol (VENTOLIN HFA) 108 (90 Base) MCG/ACT inhaler [id number] No Inhale 2 puffs into the lungs every 6 (six) hours as needed for wheezing or shortness of breath. [name], DO Taking Active   cetirizine (ZYRTEC) 10 MG tablet [id number] No Take 10 mg by mouth daily as needed for allergies. [provider] Taking Active   chlorhexidine (PERIDEX) 0.12 % solution [id number] No Use as directed 10 mLs in the mouth or throat. [provider] Taking Active   dicyclomine (BENTYL) 10 MG capsule [id number] No Take 1 capsule (10 mg total) by mouth 4 (four) times daily -  before meals and at bedtime. Vladimir Crofts, MD Taking Active   escitalopram (LEXAPRO) 10 MG tablet [id number] No Take 1 tablet (10 mg total) by mouth daily. Hisada,  Elie Goody, MD Taking Active   fluticasone [facility]) 50 MCG/ACT nasal spray [id number] No Place 1 spray into both nostrils daily. Use for 4-6 weeks then  stop and use seasonally or as needed. Jearld Fenton, NP Taking Active   hydrOXYzine (ATARAX) 10 MG tablet [id number] No Take 1 tablet (10 mg total) by mouth 3 (three) times daily as needed for anxiety. [name], DO Taking Active   ibuprofen (ADVIL) 800 MG tablet [id number] No Take 1 tablet (800 mg total) by mouth every 8 (eight) hours as needed. Johnn Hai, PA-C Taking Active   methocarbamol (ROBAXIN) 500 MG tablet [id number] No Take 1-2 tablets every 6 hours prn muscle spasms Johnn Hai, PA-C Taking Active   metoprolol tartrate (LOPRESSOR) 25 MG tablet [id number] No Take 0.5 tablets (12.5 mg total) by mouth 2 (two) times daily. Furth, Cadence H, PA-C Taking Expired 12/15/22 2359   montelukast (SINGULAIR) 10 MG tablet [id number] No Take 10 mg by mouth at bedtime as needed. [provider] Taking Active   Multiple Vitamins-Minerals (ONE-A-DAY WOMENS PO) [id number] No Take 1 tablet by mouth daily. [provider] Taking Active   OVER THE COUNTER MEDICATION [id number] No Goli ashwaganda gummy 1/2 daily [provider] Taking Active   pantoprazole (PROTONIX) 40 MG tablet [id number] No Take 1 tablet (40 mg total) by mouth daily. Kathlen Mody, Cadence H, PA-C Taking Active   Spacer/Aero-Holding Chambers (AEROCHAMBER MV) inhaler [id number] No Use as instructed [name], DO Taking Active   Vitamin D, Ergocalciferol, (DRISDOL) 1.25 MG (50000 UNIT) CAPS capsule [id number] No Take 1 capsule (50,000 Units total) by mouth every 7 (seven) days. [name], DO Taking Active            Patient Active Problem List   Diagnosis Date Noted   Vitamin B12 deficiency 06/09/2022   Palpitations 01/21/2022   Shortness of breath 01/21/2022   Chest pain 01/21/2022   Generalized anxiety disorder with panic attacks 09/27/2021   Environmental and seasonal allergies 11/04/2020   Genital warts 09/11/2018   Positive TB test 09/25/2017   Allergic  contact dermatitis due to metals 05/17/2017   Pre-diabetes 03/06/2017   Hyperlipidemia 03/06/2017   Allergic reaction 02/08/2017   Urticaria due to food allergy 02/08/2017   Vitamin D deficiency 11/08/2016   Iron deficiency anemia due to chronic blood loss 10/03/2016   Menorrhagia with regular cycle 06/27/2016   Mild persistent asthma without complication 0000000   Anxiety and depression 06/17/2016   Morbid obesity ([place]) 06/17/2016   PTSD (post-traumatic stress disorder) 2016   Conditions to be addressed/monitored per PCP order:  Chronic healthcare management needs, asthma, anxiety/depression/PTSD/panic attacks, HLD, anemia, migraines  Care Plan : RN Care Manager Plan of Care  Updates made by Jasmine Medicus, RN since 02/01/2023 12:00 AM     Problem: Health Promotion or Disease Self-Management (General Plan of Care)      Long-Range Goal: Chronic Disease Management and Care Coordination Needs   Expected End Date: 03/07/2023  Priority: High  Note:   Current Barriers:  Knowledge Deficits related to plan of care for management of Asthma, anxiety/depression/PTSD, HLD  Chronic Disease Management support and education needs related to Asthma, anxiety/depression/PTSD, HLD 02/01/23:  Patient with no complaints today, to follow up on sleep study results.  Seen and evaluated by HEME 1/25 and PSYCH 1/16-continues therapy.  Denies CP today and headaches today.  Patient monitoring breathing with increased pollen-stable right now.  RNCM Clinical Goal(s):  Patient will verbalize  understanding of plan for management of Asthma, anxiety as evidenced by patient report verbalize basic understanding of  Asthma disease process and self health management plan as evidenced by patient report take all medications exactly as prescribed and will call provider for medication related questions as evidenced by patient report demonstrate understanding of rationale for each prescribed medication as evidenced by  patient report attend all scheduled medical appointments: as evidenced by patient report continue to work with RN Care Manager to address care management and care coordination needs related to  Asthma as evidenced by adherence to CM Team Scheduled appointments work with Education officer, museum to address  related to the management of Mental Health Concerns  related to the management of Anxiety, Depression, and PTSD, panic attacks as evidenced by review of EMR and patient or social worker report  Interventions: Inter-disciplinary care team collaboration (see longitudinal plan of care) Evaluation of current treatment plan related to  self management and patient's adherence to plan as established by provider  Hyperlipidemia Interventions:  (Status:  New goal.) Long Term Goal Medication review performed; medication list updated in electronic medical record.  Counseled on importance of regular laboratory monitoring as prescribed Assessed social determinant of health barriers    Asthma: (Status:New goal.) Long Term Goal Advised patient to track and manage Asthma triggers Advised patient to self assesses Asthma action plan zone and make appointment with provider if in the yellow zone for 48 hours without improvement Discussed the importance of adequate rest and management of fatigue with Asthma Assessed social determinant of health barriers  Collaborated with SW SW referral for anxiety/depression/panic attacks-completed  Patient Goals/Self-Care Activities: Take all medications as prescribed Attend all scheduled provider appointments Call pharmacy for medication refills 3-7 days in advance of running out of medications Perform all self care activities independently  Perform IADL's (shopping, preparing meals, housekeeping, managing finances) independently Call provider office for new concerns or questions  Work with the social worker to address care coordination needs and will continue to work with the  clinical team to address health care and disease management related needs Patient to contact Pharmacy and/or Provider regarding medication Patient to follow up on sleep study  Follow Up Plan:  The patient has been provided with contact information for the care management team and has been advised to call with any health related questions or concerns.  The care management team will reach out to the patient again over the next 30 business  days.     Long-Range Goal: Establish Plan of Care for Chronic Disease Management Needs   Priority: High  Note:   Timeframe:  Long-Range Goal Priority:  High Start Date:   03/07/22                          Expected End Date:     ongoing                  Follow Up Date 03/06/23   - schedule appointment for flu shot - schedule appointment for vaccines needed due to my age or health - schedule recommended health tests (blood work, mammogram, colonoscopy, pap test) - schedule and keep appointment for annual check-up    Why is this important?   Screening tests can find diseases early when they are easier to treat.  Your doctor or nurse will talk with you about which tests are important for you.  Getting shots for common diseases like the flu and shingles will  help prevent them.  02/01/23:  patient has GYN visit 4/4, to f/u on sleep study   Follow Up:  Patient agrees to Care Plan and Follow-up.  Plan: The Managed Medicaid care management team will reach out to the patient again over the next 30 business  days. and The  Patient has been provided with contact information for the Managed Medicaid care management team and has been advised to call with any health related questions or concerns.  Date/time of next scheduled RN care management/care coordination outreach:  03/06/23 at 0900.

## 2023-02-01 NOTE — Patient Instructions (Signed)
Hi Jasmine Gordon Birthday!  Have a wonderful day!!  Jasmine Gordon was given information about Medicaid Managed Care team care coordination services as a part of their Healthy [facility] benefit. Jasmine Gordon verbally consented to engagement with the [facility] Managed Care team.   If you are experiencing a medical emergency, please call 911 or report to your local emergency department or urgent care.   If you have a non-emergency medical problem during routine business hours, please contact your provider's office and ask to speak with a nurse.   For questions related to your Healthy [facility] health plan, please call: [phone] or visit the homepage here: [email or web address]  If you would like to schedule transportation through your Healthy [facility] plan, please call the following number at least 2 days in advance of your appointment: [phone]  For information about your ride after you set it up, call Ride Assist at [phone]. Use this number to activate a Will Call pickup, or if your transportation is late for a scheduled pickup. Use this number, too, if you need to make a change or cancel a previously scheduled reservation.  If you need transportation services right away, call [phone]. The after-hours call center is staffed 24 hours to handle ride assistance and urgent reservation requests (including discharges) 365 days a year. Urgent trips include sick visits, hospital discharge requests and life-sustaining treatment.  Call the [place] at [phone], at any time, 24 hours a day, 7 days a week. If you are in danger or need immediate medical attention call 911.  If you would like help to quit smoking, call 1-800-QUIT-NOW 805-702-2793) OR Espaol: 1-855-Djelo-Ya QO:409462) o para ms informacin haga clic aqu or Text READY to 200-400 to register via text  Jasmine Gordon -  following are the goals we discussed in your visit today:   Goals Addressed    Timeframe:  Long-Range Goal Priority:  High Start Date:   03/07/22                          Expected End Date:     ongoing                  Follow Up Date 03/06/23   - schedule appointment for flu shot - schedule appointment for vaccines needed due to my age or health - schedule recommended health tests (blood work, mammogram, colonoscopy, pap test) - schedule and keep appointment for annual check-up    Why is this important?   Screening tests can find diseases early when they are easier to treat.  Your doctor or nurse will talk with you about which tests are important for you.  Getting shots for common diseases like the flu and shingles will help prevent them.  02/01/23:  patient has GYN visit 4/4, to f/u on sleep study  Patient verbalizes understanding of instructions and care plan provided today and agrees to view in [place]. Active MyChart status and patient understanding of how to access instructions and care plan via MyChart confirmed with patient.     The Managed Medicaid care management team will reach out to the patient again over the next 30 business  days.  The  Patient  has been provided with contact information for the Managed Medicaid care management team and has been advised to call with any health related questions or concerns.   Aida Raider RN, BSN [place]  Management Coordinator - Managed Medicaid High Risk [phone]   Following is a copy of your plan of care:  Care Plan : [place] of Care  Updates made by Gayla Medicus, RN since 02/01/2023 12:00 AM     Problem: Health Promotion or Disease Self-Management (General Plan of Care)      Long-Range Goal: Chronic Disease Management and Care Coordination Needs   Expected End Date: 03/07/2023  Priority: High  Note:   Current Barriers:  Knowledge Deficits related to plan of care for  management of Asthma, anxiety/depression/PTSD, HLD  Chronic Disease Management support and education needs related to Asthma, anxiety/depression/PTSD, HLD 02/01/23:  Patient with no complaints today, to follow up on sleep study results.  Seen and evaluated by HEME 1/25 and PSYCH 1/16-continues therapy.  Denies CP today and headaches today.  Patient monitoring breathing with increased pollen-stable right now.  RNCM Clinical Goal(s):  Patient will verbalize understanding of plan for management of Asthma, anxiety as evidenced by patient report verbalize basic understanding of  Asthma disease process and self health management plan as evidenced by patient report take all medications exactly as prescribed and will call provider for medication related questions as evidenced by patient report demonstrate understanding of rationale for each prescribed medication as evidenced by patient report attend all scheduled medical appointments: as evidenced by patient report continue to work with RN Care Manager to address care management and care coordination needs related to  Asthma as evidenced by adherence to CM Team Scheduled appointments work with Education officer, museum to address  related to the management of Mental Health Concerns  related to the management of Anxiety, Depression, and PTSD, panic attacks as evidenced by review of EMR and patient or social worker report  Interventions: Inter-disciplinary care team collaboration (see longitudinal plan of care) Evaluation of current treatment plan related to  self management and patient's adherence to plan as established by provider  Hyperlipidemia Interventions:  (Status:  New goal.) Long Term Goal Medication review performed; medication list updated in electronic medical record.  Counseled on importance of regular laboratory monitoring as prescribed Assessed social determinant of health barriers    Asthma: (Status:New goal.) Long Term Goal Advised patient to track  and manage Asthma triggers Advised patient to self assesses Asthma action plan zone and make appointment with provider if in the yellow zone for 48 hours without improvement Discussed the importance of adequate rest and management of fatigue with Asthma Assessed social determinant of health barriers  Collaborated with SW SW referral for anxiety/depression/panic attacks-completed  Patient Goals/Self-Care Activities: Take all medications as prescribed Attend all scheduled provider appointments Call pharmacy for medication refills 3-7 days in advance of running out of medications Perform all self care activities independently  Perform IADL's (shopping, preparing meals, housekeeping, managing finances) independently Call provider office for new concerns or questions  Work with the social worker to address care coordination needs and will continue to work with the clinical team to address health care and disease management related needs Patient to contact Pharmacy and/or Provider regarding medication Patient to follow up on sleep study  Follow Up Plan:  The patient has been provided with contact information for the care management team and has been advised to call with any health related questions or concerns.  The care management team will reach out to the patient again over the next 30 business  days.
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
## 2023-02-06 ENCOUNTER — Telehealth (INDEPENDENT_AMBULATORY_CARE_PROVIDER_SITE_OTHER): Payer: Medicaid Other | Admitting: Psychiatry

## 2023-02-06 ENCOUNTER — Encounter: Payer: Self-pay | Admitting: Psychiatry

## 2023-02-06 DIAGNOSIS — F4310 Post-traumatic stress disorder, unspecified: Secondary | ICD-10-CM

## 2023-02-06 DIAGNOSIS — F410 Panic disorder [episodic paroxysmal anxiety] without agoraphobia: Secondary | ICD-10-CM | POA: Diagnosis not present

## 2023-02-06 DIAGNOSIS — F3341 Major depressive disorder, recurrent, in partial remission: Secondary | ICD-10-CM

## 2023-02-06 DIAGNOSIS — F3281 Premenstrual dysphoric disorder: Secondary | ICD-10-CM

## 2023-02-06 DIAGNOSIS — F411 Generalized anxiety disorder: Secondary | ICD-10-CM

## 2023-02-06 MED ORDER — ESCITALOPRAM OXALATE 10 MG PO TABS: 10.0000 mg | ORAL_TABLET | Freq: Every day | ORAL | 0 refills | Status: DC

## 2023-02-06 NOTE — Patient Instructions (Signed)
Continue lexapro 10 mg daily  Continue hydroxyzine 25 mg daily as needed for anxiety  Next appointment: 5/15 at 3 pm

## 2023-02-10 ENCOUNTER — Encounter: Payer: Self-pay | Admitting: Family Medicine

## 2023-02-10 DIAGNOSIS — J4530 Mild persistent asthma, uncomplicated: Secondary | ICD-10-CM

## 2023-02-10 MED ORDER — ALBUTEROL SULFATE HFA 108 (90 BASE) MCG/ACT IN AERS: 2.0000 | INHALATION_SPRAY | Freq: Four times a day (QID) | RESPIRATORY_TRACT | 3 refills | Status: DC | PRN
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
## 2023-02-23 ENCOUNTER — Other Ambulatory Visit (HOSPITAL_COMMUNITY)
Admission: RE | Admit: 2023-02-23 | Discharge: 2023-02-23 | Disposition: A | Discharge status: Home / Self Care | Payer: Medicaid Other | Source: Ambulatory Visit | Attending: Obstetrics and Gynecology | Admitting: Obstetrics and Gynecology

## 2023-02-23 ENCOUNTER — Ambulatory Visit (INDEPENDENT_AMBULATORY_CARE_PROVIDER_SITE_OTHER): Payer: Medicaid Other | Admitting: Obstetrics and Gynecology

## 2023-02-23 ENCOUNTER — Encounter: Payer: Self-pay | Admitting: Obstetrics and Gynecology

## 2023-02-23 VITALS — BP 128/90 | Ht 67.0 in | Wt 254.0 lb

## 2023-02-23 DIAGNOSIS — D069 Carcinoma in situ of cervix, unspecified: Secondary | ICD-10-CM | POA: Diagnosis not present

## 2023-02-23 DIAGNOSIS — Z1151 Encounter for screening for human papillomavirus (HPV): Secondary | ICD-10-CM

## 2023-02-23 DIAGNOSIS — Z124 Encounter for screening for malignant neoplasm of cervix: Secondary | ICD-10-CM | POA: Diagnosis not present

## 2023-02-23 DIAGNOSIS — Z01419 Encounter for gynecological examination (general) (routine) without abnormal findings: Secondary | ICD-10-CM | POA: Diagnosis not present

## 2023-02-23 DIAGNOSIS — N871 Moderate cervical dysplasia: Secondary | ICD-10-CM | POA: Insufficient documentation

## 2023-02-23 NOTE — Patient Instructions (Signed)
I value your feedback and you entrusting us with your care. If you get a [facility] patient survey, I would appreciate you taking the time to let us know about your experience today. Thank you! ? ? ?

## 2023-02-27 ENCOUNTER — Emergency Department
Admission: EM | Admit: 2023-02-27 | Discharge: 2023-02-27 | Disposition: A | Discharge status: Home / Self Care | Payer: Medicaid Other | Attending: Emergency Medicine | Admitting: Emergency Medicine

## 2023-02-27 ENCOUNTER — Ambulatory Visit: Payer: Medicaid Other | Admitting: Family Medicine

## 2023-02-27 DIAGNOSIS — E119 Type 2 diabetes mellitus without complications: Secondary | ICD-10-CM | POA: Diagnosis not present

## 2023-02-27 DIAGNOSIS — G629 Polyneuropathy, unspecified: Secondary | ICD-10-CM | POA: Insufficient documentation

## 2023-02-27 DIAGNOSIS — D649 Anemia, unspecified: Secondary | ICD-10-CM | POA: Insufficient documentation

## 2023-02-27 DIAGNOSIS — R202 Paresthesia of skin: Secondary | ICD-10-CM | POA: Diagnosis present

## 2023-02-27 DIAGNOSIS — R0789 Other chest pain: Secondary | ICD-10-CM | POA: Diagnosis not present

## 2023-02-27 LAB — COMPREHENSIVE METABOLIC PANEL
ALT: 13 U/L (ref 0–44)
AST: 16 U/L (ref 15–41)
Albumin: 4.1 g/dL (ref 3.5–5.0)
Alkaline Phosphatase: 45 U/L (ref 38–126)
Anion gap: 7 (ref 5–15)
BUN: 12 mg/dL (ref 6–20)
CO2: 23 mmol/L (ref 22–32)
Calcium: 9.2 mg/dL (ref 8.9–10.3)
Chloride: 106 mmol/L (ref 98–111)
Creatinine, Ser: 0.79 mg/dL (ref 0.44–1.00)
GFR, Estimated: >60 mL/min (ref >60)
Glucose, Bld: 105 mg/dL — ABNORMAL HIGH (ref 70–99)
Potassium: 3.7 mmol/L (ref 3.5–5.1)
Sodium: 136 mmol/L (ref 135–145)
Total Bilirubin: 0.2 mg/dL — ABNORMAL LOW (ref 0.3–1.2)
Total Protein: 7.5 g/dL (ref 6.5–8.1)

## 2023-02-27 LAB — URINALYSIS, ROUTINE W REFLEX MICROSCOPIC
Bilirubin Urine: NEGATIVE
Glucose, UA: NEGATIVE mg/dL
Hgb urine dipstick: NEGATIVE
Ketones, ur: NEGATIVE mg/dL
Leukocytes,Ua: NEGATIVE
Nitrite: NEGATIVE
Protein, ur: NEGATIVE mg/dL
Specific Gravity, Urine: 1.023 (ref 1.005–1.030)
pH: 5 (ref 5.0–8.0)

## 2023-02-27 LAB — POC URINE PREG, ED: Preg Test, Ur: NEGATIVE

## 2023-02-27 LAB — LIPASE, BLOOD: Lipase: 27 U/L (ref 11–51)

## 2023-02-27 LAB — CBC
HCT: 39.1 % (ref 36.0–46.0)
Hemoglobin: 12.2 g/dL (ref 12.0–15.0)
MCH: 26.1 pg (ref 26.0–34.0)
MCHC: 31.2 g/dL (ref 30.0–36.0)
MCV: 83.5 fL (ref 80.0–100.0)
Platelets: 247 10*3/uL (ref 150–400)
RBC: 4.68 MIL/uL (ref 3.87–5.11)
RDW: 13.3 % (ref 11.5–15.5)
WBC: 4.7 10*3/uL (ref 4.0–10.5)
nRBC: 0 % (ref 0.0–0.2)

## 2023-02-27 LAB — TROPONIN I (HIGH SENSITIVITY): Troponin I (High Sensitivity): <2 ng/L (ref <18)

## 2023-02-27 NOTE — ED Provider Notes (Signed)
[facility] Provider Note    Event Date/Time   First MD Initiated Contact with Patient 02/27/23 [phone]     (approximate)   History   Tingling   HPI  Jasmine Gordon is a 37 y.o. female with history of anemia, anxiety, asthma who presents with concerns over tingling in her hands and feet bilaterally which has been intermittent over the last several days.  She is concerned because her parents had diabetes and she noted that neuropathy can be related to diabetes.  She denies motor deficits or weakness.  No headache.  Has felt more fatigued than typical.  Has also had some chest tightness intermittently but not related to exertion and has been ongoing for several days.  No shortness of breath, no pleurisy.  No fevers chills or cough, some myalgias noted as well     Physical Exam   Triage Vital Signs: ED Triage Vitals [02/27/23 0832]  Enc Vitals Group     BP 133/81     Pulse Rate (!) 113     Resp 18     Temp 98 F (36.7 C)     Temp Source Oral     SpO2 100 %     Weight 115.7 kg (255 lb)     Height 1.702 m (5\' 7" )     Head Circumference      Peak Flow      Pain Score 6     Pain Loc      Pain Edu?      Excl. in GC?     Most recent vital signs: Vitals:   02/27/23 0832 02/27/23 0845  BP: 133/81   Pulse: (!) 113 100  Resp: 18   Temp: 98 F (36.7 C)   SpO2: 100% 99%     General: Awake, no distress.  CV:  Good peripheral perfusion.  Regular rate and rhythm Resp:  Normal effort.  Clear to auscultation bilaterally Abd:  No distention.  Other:  Reassuring neurological exam   ED Results / Procedures / Treatments   Labs (all labs ordered are listed, but only abnormal results are displayed) Labs Reviewed  COMPREHENSIVE METABOLIC PANEL - Abnormal; Notable for the following components:      Result Value   Glucose, Bld 105 (*)    Total Bilirubin 0.2 (*)    All other components within normal limits  URINALYSIS, ROUTINE W REFLEX MICROSCOPIC  - Abnormal; Notable for the following components:   Color, Urine YELLOW (*)    APPearance HAZY (*)    All other components within normal limits  LIPASE, BLOOD  CBC  VITAMIN B12  POC URINE PREG, ED  TROPONIN I (HIGH SENSITIVITY)     EKG  ED ECG REPORT I, Jene Every, the attending physician, personally viewed and interpreted this ECG.  Date: 02/27/2023  Rhythm: normal sinus rhythm QRS Axis: normal Intervals: normal ST/T Wave abnormalities: normal Narrative Interpretation: no evidence of acute ischemia    RADIOLOGY     PROCEDURES:  Critical Care performed:   Procedures   MEDICATIONS ORDERED IN ED: Medications - No data to display   IMPRESSION / MDM / ASSESSMENT AND PLAN / ED COURSE  I reviewed the triage vital signs and the nursing notes. Patient's presentation is most consistent with acute illness / injury with system symptoms.  Patient presents with multiple complaints as detailed above, primarily she is concerned about tingling sensation which has been intermittent does not appear to be present at this  time.  No motor deficits, no headache, no neurological red flags.  Not consistent with CVA/MS, will check vitamin B12, electrolytes, glucose  She also reports a vague chest tightness over the last several days which she does not appear particular concerned about, EKG is normal, pending high sensitive troponin.  Troponin is normal, patient is reassured, appropriate for discharge with outpatient follow-up with PCP.  B12 level is still pending        FINAL CLINICAL IMPRESSION(S) / ED DIAGNOSES   Final diagnoses:  Neuropathy     Rx / DC Orders   ED Discharge Orders     None        Note:  This document was prepared using Dragon voice recognition software and may include unintentional dictation errors.   Jene Every, MD 02/27/23 1113

## 2023-02-27 NOTE — ED Triage Notes (Signed)
Pt has multiple complaints, states that for the past month her stomach has been feeling "off", denies pain. Reports some nausea. For 2 days has been having tingling sensation to bilt hands and legs. Pt reports when she thinks about it she begins to feel anxious.

## 2023-02-28 ENCOUNTER — Telehealth: Payer: Self-pay

## 2023-02-28 ENCOUNTER — Ambulatory Visit (INDEPENDENT_AMBULATORY_CARE_PROVIDER_SITE_OTHER): Payer: Medicaid Other | Admitting: Licensed Clinical Social Worker

## 2023-02-28 DIAGNOSIS — Z91199 Patient's noncompliance with other medical treatment and regimen due to unspecified reason: Secondary | ICD-10-CM

## 2023-02-28 DIAGNOSIS — G4733 Obstructive sleep apnea (adult) (pediatric): Secondary | ICD-10-CM

## 2023-02-28 NOTE — Progress Notes (Signed)
LCSW counselor attempted to connect with patient for scheduled appointment via MyChart video text request x 2 and email request with no response.   Attempt 1: Text and email: 3:02p  Attempt 2: Text and email: 3:09p  Attempt 3: Text and email: 3:15p  Video session was closed at : 3:17p  Per [facility] policy, after multiple attempts to reach pt unsuccessfully at appointed time--visit will be coded as no show

## 2023-02-28 NOTE — Transitions of Care (Post Inpatient/ED Visit) (Signed)
   02/28/2023  Name: Larenda Feast MRN: [id number] DOB: Aug 02, 1986  Today's TOC FU Call Status: Today's TOC FU Call Status:: Successful TOC FU Call Competed TOC FU Call Complete Date: 02/28/23  Transition Care Management Follow-up Telephone Call Date of Discharge: 02/27/23 Discharge Facility: [facility]) Type of Discharge: Emergency Department Reason for ED Visit:  (Neuropathy) How have you been since you were released from the hospital?: Better Any questions or concerns?: No  Items Reviewed: Did you receive and understand the discharge instructions provided?: Yes Medications obtained and verified?: No (No medications given) Any new allergies since your discharge?: No Dietary orders reviewed?: NA Do you have support at home?: Yes  Home Care and Equipment/Supplies: Were Home Health Services Ordered?: NA Any new equipment or medical supplies ordered?: No  Functional Questionnaire: Do you need assistance with bathing/showering or dressing?: No Do you need assistance with meal preparation?: No Do you need assistance with eating?: No Do you have difficulty maintaining continence: No Do you need assistance with getting out of bed/getting out of a chair/moving?: No Do you have difficulty managing or taking your medications?: No  Follow up appointments reviewed: PCP Follow-up appointment confirmed?: No (Patient states she has an appointment scheduled for this month with PCP and will discuss then.) MD Provider Line Number:[phone] Given: No Specialist Hospital Follow-up appointment confirmed?: No Do you need transportation to your follow-up appointment?: No Do you understand care options if your condition(s) worsen?: Yes-patient verbalized understanding   SDOH Interventions    Flowsheet Row Patient Outreach Telephone from 02/01/2023 in [place] POPULATION HEALTH DEPARTMENT Telephone from 12/02/2022 in [place] POPULATION HEALTH DEPARTMENT  Patient Outreach Telephone from 11/01/2022 in [place] POPULATION HEALTH DEPARTMENT Patient Outreach Telephone from 09/29/2022 in [place] POPULATION HEALTH DEPARTMENT Office Visit from 09/16/2022 in [facility] Patient Outreach Telephone from 08/31/2022 in [facility] Community Care Coordination  SDOH Interventions        Food Insecurity Interventions -- -- -- Intervention Not Indicated -- --  Housing Interventions -- -- -- -- -- Intervention Not Indicated  Transportation Interventions -- Intervention Not Indicated -- -- -- --  Utilities Interventions Intervention Not Indicated -- -- -- -- --  Alcohol Usage Interventions -- -- -- Intervention Not Indicated (Score <7) -- --  Depression Interventions/Treatment  -- -- -- -- Medication --  Financial Strain Interventions -- -- Intervention Not Indicated -- -- --  Physical Activity Interventions -- -- Intervention Not Indicated -- -- --  Stress Interventions Other (Comment)  [enrolled in counseling] -- -- -- -- --        [name], [name], MHA   Managed [facility] Social Worker [phone]

## 2023-02-28 NOTE — Telephone Encounter (Signed)
-----   Message from Erin Fulling, MD sent at 02/28/2023 12:03 PM EDT ----- Patient has sleep apnea  Recommend AUTOCPAP 5-10 cm h20 ----- Message ----- From: Jill Poling Sent: 01/25/2023   8:52 AM EDT To: Erin Fulling, MD

## 2023-02-28 NOTE — Telephone Encounter (Signed)
Patient is aware of results and voiced her understanding.  She agrees with plan.  Order placed for CPAP. Pt prefers local DME, She will call back to schedule appt once setup on machine.  Nothing further needed.

## 2023-03-01 ENCOUNTER — Encounter: Payer: Self-pay | Admitting: Obstetrics and Gynecology

## 2023-03-06 ENCOUNTER — Other Ambulatory Visit: Payer: Medicaid Other | Admitting: Obstetrics and Gynecology

## 2023-03-06 ENCOUNTER — Encounter: Payer: Self-pay | Admitting: Obstetrics and Gynecology

## 2023-03-06 NOTE — Patient Outreach (Signed)
Care Coordination  03/06/2023  Jasmine Gordon 1986-01-07 [id number]  RNCM called patient at scheduled time.  Patient answered phone and asked to be called another time.  Appointment rescheduled at patient request.  [name] RN, BSN [place]  [facility] Care Management Coordinator - Managed [place] High Risk [phone]

## 2023-03-07 ENCOUNTER — Encounter: Payer: Self-pay | Admitting: Family Medicine

## 2023-03-07 ENCOUNTER — Other Ambulatory Visit: Payer: Self-pay | Admitting: Family Medicine

## 2023-03-07 ENCOUNTER — Ambulatory Visit (INDEPENDENT_AMBULATORY_CARE_PROVIDER_SITE_OTHER): Payer: Medicaid Other | Admitting: Family Medicine

## 2023-03-07 DIAGNOSIS — R7303 Prediabetes: Secondary | ICD-10-CM

## 2023-03-07 DIAGNOSIS — E559 Vitamin D deficiency, unspecified: Secondary | ICD-10-CM

## 2023-03-07 DIAGNOSIS — F4310 Post-traumatic stress disorder, unspecified: Secondary | ICD-10-CM

## 2023-03-07 DIAGNOSIS — D500 Iron deficiency anemia secondary to blood loss (chronic): Secondary | ICD-10-CM

## 2023-03-07 DIAGNOSIS — Z0000 Encounter for general adult medical examination without abnormal findings: Secondary | ICD-10-CM

## 2023-03-07 DIAGNOSIS — E538 Deficiency of other specified B group vitamins: Secondary | ICD-10-CM

## 2023-03-07 DIAGNOSIS — F32A Depression, unspecified: Secondary | ICD-10-CM | POA: Diagnosis not present

## 2023-03-07 DIAGNOSIS — F419 Anxiety disorder, unspecified: Secondary | ICD-10-CM

## 2023-03-07 DIAGNOSIS — F410 Panic disorder [episodic paroxysmal anxiety] without agoraphobia: Secondary | ICD-10-CM

## 2023-03-07 DIAGNOSIS — E7800 Pure hypercholesterolemia, unspecified: Secondary | ICD-10-CM

## 2023-03-07 LAB — CYTOLOGY - PAP
Comment: NEGATIVE
Diagnosis: NEGATIVE
Diagnosis: REACTIVE
High risk HPV: NEGATIVE

## 2023-03-07 MED ORDER — CONTRAVE 8-90 MG PO TB12: ORAL_TABLET | ORAL | 0 refills | Status: DC

## 2023-03-07 NOTE — Patient Instructions (Addendum)
Thank you for coming to the office today.  Xenical, Orlistat, Karie Mainland - these are OTC medications brand or generic that work on the digestion and help reduce body absorption for excess calories and nutrients.  The injectables are going to be very high cost and not covered  We can always reconsider Contrave. Sample 7 day once daily.  DUE for FASTING BLOOD WORK (no food or drink after midnight before the lab appointment, only water or coffee without cream/sugar on the morning of)  SCHEDULE "Lab Only" visit in the morning at the clinic for lab draw in 6 MONTHS   - Make sure Lab Only appointment is at about 1 week before your next appointment, so that results will be available  For Lab Results, once available within 2-3 days of blood draw, you can can log in to MyChart online to view your results and a brief explanation. Also, we can discuss results at next follow-up visit.   Please schedule a Follow-up Appointment to: Return in about 6 months (around 09/06/2023) for 6 month fasting lab only then 1 week later Annual Physical.  If you have any other questions or concerns, please feel free to call the office or send a message through MyChart. You may also schedule an earlier appointment if necessary.  Additionally, you may be receiving a survey about your experience at our office within a few days to 1 week by e-mail or mail. We value your feedback.  [name], DO [facility], [place]

## 2023-03-07 NOTE — Progress Notes (Signed)
Subjective:    Patient ID: Jasmine Gordon, female    DOB: 1986/05/16, 37 y.o.   MRN: [id number]  Jasmine Gordon is a 37 y.o. female presenting on 03/07/2023 for Follow-up and Obesity   HPI  ED Follow-up 02/27/23 She admits episode of neuropathy with tingling in her feet with pins and needles tingling, and symptoms of prickling itching in arms. Improved with movement.  Morbid obesity BMI >40 Increased activity, but still gaining weight She has changed her diet Not eating much bread or sugar carbs Still active with work, but not as physical Weight range fluctuated from 260s (3-4 years ago) to 240-250 lbs  Asking about Lead testing It is not needed. Old house with paint. But no aerosolizing or interfering with the paint  Vitamin B12 Low Insufficiency with lab 200s  ED Follow-up Acute Anxiety / Stressors Constellation of symptoms. She had lab testing and evaluation, ultimately discharged with panic symptoms   Has [facility] Dr Vanetta Shawl, and therapist. Following with improvement overall   Elevated A1c PreDM parents have Diabetes Concerned about her risk of progression to diabetes Prior A1c in 5 range She had a change due to anemia and iron infusion, lowered artificially A1c 5.5      03/07/2023   10:08 AM 09/16/2022    1:12 PM 07/12/2022   11:56 AM  Depression screen PHQ 2/9  Decreased Interest 1 1   Down, Depressed, Hopeless 0 1   PHQ - 2 Score 1 2   Altered sleeping 1 1   Tired, decreased energy 0 1   Change in appetite 0 1   Feeling bad or failure about yourself  1 2   Trouble concentrating 1 0   Moving slowly or fidgety/restless 0 0   Suicidal thoughts 0 0   PHQ-9 Score 4 7   Difficult doing work/chores Somewhat difficult Somewhat difficult      Information is confidential and restricted. Go to Review Flowsheets to unlock data.      03/07/2023   10:09 AM 09/16/2022    1:12 PM 07/12/2022   11:57 AM 03/25/2022    9:48 AM  GAD 7 : Generalized  Anxiety Score  Nervous, Anxious, on Edge 1 2    Control/stop worrying 1 2    Worry too much - different things 1 2    Trouble relaxing 1 2    Restless 1 0    Easily annoyed or irritable 1 1    Afraid - awful might happen 1 3    Total GAD 7 Score 7 12    Anxiety Difficulty Somewhat difficult Somewhat difficult       Information is confidential and restricted. Go to Review Flowsheets to unlock data.      Social History   Tobacco Use   Smoking status: Never   Smokeless tobacco: Never  Vaping Use   Vaping Use: Never used  Substance Use Topics   Alcohol use: Not Currently   Drug use: No    Review of Systems Per HPI unless specifically indicated above     Objective:    BP 122/82 (BP Location: Left Arm, Patient Position: Sitting)   Pulse 91   Ht 5\' 7"  (1.702 m)   Wt 258 lb 3.2 oz (117.1 kg)   LMP 02/09/2023 (Exact Date)   SpO2 98%   BMI 40.44 kg/m   Wt Readings from Last 3 Encounters:  03/07/23 258 lb 3.2 oz (117.1 kg)  02/27/23 255 lb (115.7 kg)  02/23/23  254 lb (115.2 kg)    Physical Exam Vitals and nursing note reviewed.  Constitutional:      General: She is not in acute distress.    Appearance: Normal appearance. She is well-developed. She is obese. She is not diaphoretic.     Comments: Well-appearing, comfortable, cooperative  HENT:     Head: Normocephalic and atraumatic.  Eyes:     General:        Right eye: No discharge.        Left eye: No discharge.     Conjunctiva/sclera: Conjunctivae normal.  Cardiovascular:     Rate and Rhythm: Normal rate.  Pulmonary:     Effort: Pulmonary effort is normal.  Skin:    General: Skin is warm and dry.     Findings: No erythema or rash.  Neurological:     Mental Status: She is alert and oriented to person, place, and time.  Psychiatric:        Mood and Affect: Mood normal.        Behavior: Behavior normal.        Thought Content: Thought content normal.     Comments: Well groomed, good eye contact, normal  speech and thoughts       Results for orders placed or performed during the hospital encounter of 02/27/23  Lipase, blood  Result Value Ref Range   Lipase 27 11 - 51 U/L  Comprehensive metabolic panel  Result Value Ref Range   Sodium 136 135 - 145 mmol/L   Potassium 3.7 3.5 - 5.1 mmol/L   Chloride 106 98 - 111 mmol/L   CO2 23 22 - 32 mmol/L   Glucose, Bld 105 (H) 70 - 99 mg/dL   BUN 12 6 - 20 mg/dL   Creatinine, Ser 4.09 0.44 - 1.00 mg/dL   Calcium 9.2 8.9 - 81.1 mg/dL   Total Protein 7.5 6.5 - 8.1 g/dL   Albumin 4.1 3.5 - 5.0 g/dL   AST 16 15 - 41 U/L   ALT 13 0 - 44 U/L   Alkaline Phosphatase 45 38 - 126 U/L   Total Bilirubin 0.2 (L) 0.3 - 1.2 mg/dL   GFR, Estimated >91 >47 mL/min   Anion gap 7 5 - 15  CBC  Result Value Ref Range   WBC 4.7 4.0 - 10.5 K/uL   RBC 4.68 3.87 - 5.11 MIL/uL   Hemoglobin 12.2 12.0 - 15.0 g/dL   HCT 82.9 56.2 - 13.0 %   MCV 83.5 80.0 - 100.0 fL   MCH 26.1 26.0 - 34.0 pg   MCHC 31.2 30.0 - 36.0 g/dL   RDW 86.5 78.4 - 69.6 %   Platelets 247 150 - 400 K/uL   nRBC 0.0 0.0 - 0.2 %  Urinalysis, Routine w reflex microscopic -Urine, Clean Catch  Result Value Ref Range   Color, Urine YELLOW (A) YELLOW   APPearance HAZY (A) CLEAR   Specific Gravity, Urine 1.023 1.005 - 1.030   pH 5.0 5.0 - 8.0   Glucose, UA NEGATIVE NEGATIVE mg/dL   Hgb urine dipstick NEGATIVE NEGATIVE   Bilirubin Urine NEGATIVE NEGATIVE   Ketones, ur NEGATIVE NEGATIVE mg/dL   Protein, ur NEGATIVE NEGATIVE mg/dL   Nitrite NEGATIVE NEGATIVE   Leukocytes,Ua NEGATIVE NEGATIVE  POC urine preg, ED  Result Value Ref Range   Preg Test, Ur NEGATIVE NEGATIVE  Troponin I (High Sensitivity)  Result Value Ref Range   Troponin I (High Sensitivity) <2 <18 ng/L  Assessment & Plan:   Problem List Items Addressed This Visit     Anxiety and depression   Morbid obesity - Primary   Relevant Medications   Naltrexone-buPROPion HCl ER (CONTRAVE) 8-90 MG TB12   Morbid obesity BMI  >40  Weight Management discussion She has tried Contrave in past Fluctuation of weight over past 3-4 years with loss and gain, ultimately net gain, without success on diet lifestyle exercise  Unable to get GLP1 therapy covered based on insurance.  Advice next on other med options - Xenical, Orlistat, Karie Mainland - these are OTC medications brand or generic that work on the digestion and help reduce body absorption for excess calories and nutrients.  We can always reconsider Contrave. Sample 7 day once daily. Given today to repeat trial in future if need.  Meds ordered this encounter  Medications   Naltrexone-buPROPion HCl ER (CONTRAVE) 8-90 MG TB12    Sig: Start 1 tablet every morning for 7 days, then 1 tablet twice daily for 7 days, then 2 tablets every morning and one every evening    Dispense:  30 tablet    Refill:  0     Follow up plan: Return in about 6 months (around 09/06/2023) for 6 month fasting lab only then 1 week later Annual Physical.  Future labs ordered for 6 months full labs.   [name], DO [facility] 03/07/2023, 10:11 AM

## 2023-03-15 ENCOUNTER — Other Ambulatory Visit: Payer: Medicaid Other | Admitting: Obstetrics and Gynecology

## 2023-03-15 ENCOUNTER — Encounter: Payer: Self-pay | Admitting: Obstetrics and Gynecology

## 2023-03-15 NOTE — Patient Outreach (Signed)
Medicaid Managed Care   Nurse Care Manager Note  03/15/2023 Name:  Jasmine Gordon MRN:  [id number] DOB:  September 23, 1986  Jasmine Gordon is an 37 y.o. year old female who is a primary patient of [name], DO.  The [facility] Managed Care Coordination team was consulted for assistance with:    Chronic healthcare management needs, asthma, anxiety/depression/PTSD/PA, H:D, anemia, migraines, neuropathy  Jasmine Gordon was given information about Medicaid Managed Care Coordination team services today. Suzanne [name] Patient agreed to services and verbal consent obtained.  Engaged with patient by telephone for follow up visit in response to provider referral for case management and/or care coordination services.   Assessments/Interventions:  Review of past medical history, allergies, medications, health status, including review of consultants reports, laboratory and other test data, was performed as part of comprehensive evaluation and provision of chronic care management services.  SDOH (Social Determinants of Health) assessments and interventions performed: SDOH Interventions    Flowsheet Row Patient Outreach Telephone from 03/15/2023 in [place] POPULATION HEALTH DEPARTMENT Patient Outreach Telephone from 03/06/2023 in [place] POPULATION HEALTH DEPARTMENT Patient Outreach Telephone from 02/01/2023 in [place] POPULATION HEALTH DEPARTMENT Telephone from 12/02/2022 in [place] POPULATION HEALTH DEPARTMENT Patient Outreach Telephone from 11/01/2022 in [place] POPULATION HEALTH DEPARTMENT Patient Outreach Telephone from 09/29/2022 in [place] POPULATION HEALTH DEPARTMENT  SDOH Interventions        Food Insecurity Interventions -- Intervention Not Indicated -- -- -- Intervention Not Indicated  Housing Interventions -- Intervention Not Indicated -- -- -- --  Transportation Interventions Intervention Not Indicated -- -- Intervention Not Indicated -- --   Utilities Interventions -- -- Intervention Not Indicated -- -- --  Alcohol Usage Interventions -- -- -- -- -- Intervention Not Indicated (Score <7)  Financial Strain Interventions -- -- -- -- Intervention Not Indicated --  Physical Activity Interventions -- -- -- -- Intervention Not Indicated --  Stress Interventions -- -- Other (Comment)  [enrolled in counseling] -- -- --     Care Plan  Allergies  Allergen Reactions   Reglan [Metoclopramide] Hives and Shortness Of Breath   Contrave [Naltrexone-Bupropion Hcl Er] Other (See Comments)    Had some reaction to the medication    Medications Reviewed Today     Reviewed by [name], RN (Registered Nurse) on 03/15/23 at 1434  Med List Status: <None>   Medication Order Taking? Sig Documenting Provider Last Dose Status Informant  albuterol (PROVENTIL) (2.5 MG/3ML) 0.083% nebulizer solution [id number] No Take 3 mLs (2.5 mg total) by nebulization every 4 (four) hours as needed for wheezing or shortness of breath. Merwyn Katos, MD Taking Active   albuterol (VENTOLIN HFA) 108 (90 Base) MCG/ACT inhaler [id number] No Inhale 2 puffs into the lungs every 6 (six) hours as needed for wheezing or shortness of breath. [name], DO Taking Active   cetirizine (ZYRTEC) 10 MG tablet [id number] No Take 10 mg by mouth daily as needed for allergies. [provider] Taking Active   chlorhexidine (PERIDEX) 0.12 % solution [id number] No Use as directed 10 mLs in the mouth or throat. [provider] Taking Active   escitalopram (LEXAPRO) 10 MG tablet [id number] No Take 1 tablet (10 mg total) by mouth daily. Neysa Hotter, MD Taking Active   fluticasone (FLONASE) 50 MCG/ACT nasal spray [id number] No Place 1 spray into both nostrils daily. Use for 4-6 weeks then stop and use seasonally or as needed. Lorre Munroe, NP Taking Active  hydrOXYzine (ATARAX) 10 MG tablet [id number] No Take 1 tablet (10 mg total) by mouth 3 (three) times  daily as needed for anxiety. Karamalegos, Netta Neat, DO Taking Active   montelukast (SINGULAIR) 10 MG tablet [id number] No Take 10 mg by mouth at bedtime as needed. [provider] Taking Active   Multiple Vitamins-Minerals (ONE-A-DAY WOMENS PO) [id number] No Take 1 tablet by mouth daily. [provider] Taking Active   Naltrexone-buPROPion HCl ER (CONTRAVE) 8-90 MG TB12 [id number]  Start 1 tablet every morning for 7 days, then 1 tablet twice daily for 7 days, then 2 tablets every morning and one every evening [name], DO  Active   Spacer/Aero-Holding Chambers (AEROCHAMBER MV) inhaler [id number] No Use as instructed [name], DO Taking Active   Vitamin D, Ergocalciferol, (DRISDOL) 1.25 MG (50000 UNIT) CAPS capsule [id number] No Take 1 capsule (50,000 Units total) by mouth every 7 (seven) days. [name], DO Taking Active            Patient Active Problem List   Diagnosis Date Noted   Dysplasia of cervix, high grade CIN 2 02/23/2023   High grade squamous intraepithelial lesion (HGSIL), grade 3 CIN, on biopsy of cervix 02/23/2023   Vitamin B12 deficiency 06/09/2022   Palpitations 01/21/2022   Shortness of breath 01/21/2022   Chest pain 01/21/2022   Generalized anxiety disorder with panic attacks 09/27/2021   Environmental and seasonal allergies 11/04/2020   Genital warts 09/11/2018   Positive TB test 09/25/2017   Allergic contact dermatitis due to metals 05/17/2017   Pre-diabetes 03/06/2017   Hyperlipidemia 03/06/2017   Allergic reaction 02/08/2017   Urticaria due to food allergy 02/08/2017   Vitamin D deficiency 11/08/2016   Iron deficiency anemia due to chronic blood loss 10/03/2016   Menorrhagia with regular cycle 06/27/2016   Mild persistent asthma without complication 06/17/2016   Anxiety and depression 06/17/2016   Morbid obesity 06/17/2016   PTSD (post-traumatic stress disorder) 2016   Conditions to be  addressed/monitored per PCP order:  Chronic healthcare management needs, asthma, anxiety/depression/PTSD/PA, H:D, anemia, migraines, neuropathy  Care Plan : RN Care Manager Plan of Care  Updates made by [name], RN since 03/15/2023 12:00 AM     Problem: Health Promotion or Disease Self-Management (General Plan of Care)      Long-Range Goal: Chronic Disease Management and Care Coordination Needs   Expected End Date: 06/14/2023  Priority: High  Note:   Current Barriers:  Knowledge Deficits related to plan of care for management of Asthma, anxiety/depression/PTSD, HLD  Chronic Disease Management support and education needs related to Asthma, anxiety/depression/PTSD, HLD 03/05/23:  Patient with no complaints today-does qualify for CPAP.  Continues therapy  RNCM Clinical Goal(s):  Patient will verbalize understanding of plan for management of Asthma, anxiety as evidenced by patient report verbalize basic understanding of  Asthma disease process and self health management plan as evidenced by patient report take all medications exactly as prescribed and will call provider for medication related questions as evidenced by patient report demonstrate understanding of rationale for each prescribed medication as evidenced by patient report attend all scheduled medical appointments: as evidenced by patient report continue to work with RN Care Manager to address care management and care coordination needs related to  Asthma as evidenced by adherence to CM Team Scheduled appointments work with social worker to address  related to the management of Mental Health Concerns  related to the management of Anxiety, Depression, and PTSD, panic  attacks as evidenced by review of EMR and patient or social worker report  Interventions: Inter-disciplinary care team collaboration (see longitudinal plan of care) Evaluation of current treatment plan related to  self management and patient's adherence to plan as  established by provider  Hyperlipidemia Interventions:  (Status:  New goal.) Long Term Goal Medication review performed; medication list updated in electronic medical record.  Counseled on importance of regular laboratory monitoring as prescribed Assessed social determinant of health barriers    Asthma: (Status:New goal.) Long Term Goal Advised patient to track and manage Asthma triggers Advised patient to self assesses Asthma action plan zone and make appointment with provider if in the yellow zone for 48 hours without improvement Discussed the importance of adequate rest and management of fatigue with Asthma Assessed social determinant of health barriers  Collaborated with SW SW referral for anxiety/depression/panic attacks-completed  Patient Goals/Self-Care Activities: Take all medications as prescribed Attend all scheduled provider appointments Call pharmacy for medication refills 3-7 days in advance of running out of medications Perform all self care activities independently  Perform IADL's (shopping, preparing meals, housekeeping, managing finances) independently Call provider office for new concerns or questions  Work with the social worker to address care coordination needs and will continue to work with the clinical team to address health care and disease management related needs Patient to contact Pharmacy and/or Provider regarding medication Patient to follow up on sleep study  Follow Up Plan:  The patient has been provided with contact information for the care management team and has been advised to call with any health related questions or concerns.  The care management team will reach out to the patient again over the next 60 business  days.    Long-Range Goal: Establish Plan of Care for Chronic Disease Management Needs   Priority: High  Note:   Timeframe:  Long-Range Goal Priority:  High Start Date:   03/07/22                          Expected End Date:     ongoing                   Follow Up Date 05/15/23   - schedule appointment for flu shot - schedule appointment for vaccines needed due to my age or health - schedule recommended health tests (blood work, mammogram, colonoscopy, pap test) - schedule and keep appointment for annual check-up    Why is this important?   Screening tests can find diseases early when they are easier to treat.  Your doctor or nurse will talk with you about which tests are important for you.  Getting shots for common diseases like the flu and shingles will help prevent them.  03/15/23:  patient seen and evaluated by PCP 4/16, to get CPAP   Follow Up:  Patient agrees to Care Plan and Follow-up.  Plan: The Managed Medicaid care management team will reach out to the patient again over the next 60 business  days. and The  Patient has been provided with contact information for the Managed Medicaid care management team and has been advised to call with any health related questions or concerns.  Date/time of next scheduled RN care management/care coordination outreach:  05/15/23 at 1030.

## 2023-03-15 NOTE — Patient Instructions (Signed)
Visit Information  [name] was given information about Medicaid Managed Care team care coordination services as a part of their Healthy [facility] Medicaid benefit. Jasmine Gordon verbally consented to engagement with the [facility] Managed Care team.   If you are experiencing a medical emergency, please call 911 or report to your local emergency department or urgent care.   If you have a non-emergency medical problem during routine business hours, please contact your provider's office and ask to speak with a nurse.   For questions related to your Healthy [facility] health plan, please call: [phone] or visit the homepage here: [email or web address]  If you would like to schedule transportation through your Healthy [facility] plan, please call the following number at least 2 days in advance of your appointment: [phone]  For information about your ride after you set it up, call Ride Assist at [phone]. Use this number to activate a Will Call pickup, or if your transportation is late for a scheduled pickup. Use this number, too, if you need to make a change or cancel a previously scheduled reservation.  If you need transportation services right away, call [phone]. The after-hours call center is staffed 24 hours to handle ride assistance and urgent reservation requests (including discharges) 365 days a year. Urgent trips include sick visits, hospital discharge requests and life-sustaining treatment.  Call the [facility] Line at [phone], at any time, 24 hours a day, 7 days a week. If you are in danger or need immediate medical attention call 911.  If you would like help to quit smoking, call 1-800-QUIT-NOW (865-857-0422) OR Espaol: 1-855-Djelo-Ya (4-742-595-6387) o para ms informacin haga clic aqu or Text READY to 564-332 to register via text  [name] following are the goals we discussed in your visit  today:   Goals Addressed    Timeframe:  Long-Range Goal Priority:  High Start Date:   03/07/22                          Expected End Date:     ongoing                  Follow Up Date 05/15/23   - schedule appointment for flu shot - schedule appointment for vaccines needed due to my age or health - schedule recommended health tests (blood work, mammogram, colonoscopy, pap test) - schedule and keep appointment for annual check-up    Why is this important?   Screening tests can find diseases early when they are easier to treat.  Your doctor or nurse will talk with you about which tests are important for you.  Getting shots for common diseases like the flu and shingles will help prevent them.  03/15/23:  patient seen and evaluated by PCP 4/16, to get CPAP  Patient verbalizes understanding of instructions and care plan provided today and agrees to view in MyChart. Active MyChart status and patient understanding of how to access instructions and care plan via MyChart confirmed with patient.     The Managed Medicaid care management team will reach out to the patient again over the next 60 business  days.  The  Patient has been provided with contact information for the Managed Medicaid care management team and has been advised to call with any health related questions or concerns.   [name] RN, BSN [place]  [employer], production - Managed Medicaid High Risk [phone]  Following is a copy of your plan of care:  Care Plan : RN Care Manager Plan of Care  Updates made by [name], RN since 03/15/2023 12:00 AM     Problem: Health Promotion or Disease Self-Management (General Plan of Care)      Long-Range Goal: Chronic Disease Management and Care Coordination Needs   Expected End Date: 06/14/2023  Priority: High  Note:   Current Barriers:  Knowledge Deficits related to plan of care for management of Asthma, anxiety/depression/PTSD, HLD   Chronic Disease Management support and education needs related to Asthma, anxiety/depression/PTSD, HLD 03/05/23:  Patient with no complaints today-does qualify for CPAP.  Continues therapy  RNCM Clinical Goal(s):  Patient will verbalize understanding of plan for management of Asthma, anxiety as evidenced by patient report verbalize basic understanding of  Asthma disease process and self health management plan as evidenced by patient report take all medications exactly as prescribed and will call provider for medication related questions as evidenced by patient report demonstrate understanding of rationale for each prescribed medication as evidenced by patient report attend all scheduled medical appointments: as evidenced by patient report continue to work with RN Care Manager to address care management and care coordination needs related to  Asthma as evidenced by adherence to CM Team Scheduled appointments work with Child psychotherapist to address  related to the management of Mental Health Concerns  related to the management of Anxiety, Depression, and PTSD, panic attacks as evidenced by review of EMR and patient or social worker report  Interventions: Inter-disciplinary care team collaboration (see longitudinal plan of care) Evaluation of current treatment plan related to  self management and patient's adherence to plan as established by provider  Hyperlipidemia Interventions:  (Status:  New goal.) Long Term Goal Medication review performed; medication list updated in electronic medical record.  Counseled on importance of regular laboratory monitoring as prescribed Assessed social determinant of health barriers    Asthma: (Status:New goal.) Long Term Goal Advised patient to track and manage Asthma triggers Advised patient to self assesses Asthma action plan zone and make appointment with provider if in the yellow zone for 48 hours without improvement Discussed the importance of adequate rest and  management of fatigue with Asthma Assessed social determinant of health barriers  Collaborated with SW SW referral for anxiety/depression/panic attacks-completed  Patient Goals/Self-Care Activities: Take all medications as prescribed Attend all scheduled provider appointments Call pharmacy for medication refills 3-7 days in advance of running out of medications Perform all self care activities independently  Perform IADL's (shopping, preparing meals, housekeeping, managing finances) independently Call provider office for new concerns or questions  Work with the social worker to address care coordination needs and will continue to work with the clinical team to address health care and disease management related needs Patient to contact Pharmacy and/or Provider regarding medication Patient to follow up on sleep study  Follow Up Plan:  The patient has been provided with contact information for the care management team and has been advised to call with any health related questions or concerns.  The care management team will reach out to the patient again over the next 60 business  days.

## 2023-03-17 ENCOUNTER — Ambulatory Visit: Payer: Medicaid Other | Admitting: Family Medicine

## 2023-03-24 DIAGNOSIS — G4733 Obstructive sleep apnea (adult) (pediatric): Secondary | ICD-10-CM | POA: Diagnosis not present
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
## 2023-03-29 ENCOUNTER — Encounter: Payer: Medicaid Other | Admitting: Psychiatry

## 2023-03-29 ENCOUNTER — Telehealth: Payer: Self-pay | Admitting: Psychiatry

## 2023-03-29 NOTE — Progress Notes (Signed)
This encounter was created in error - please disregard.

## 2023-03-29 NOTE — Telephone Encounter (Signed)
Sent a video visit link through Epic. Although she checked in late, she mentioned forgetting about this appointment and she is riding on a bus. She agrees that this will be counted as a no-show. We'll reschedule for 6/24 at 9:30 for the video visit.

## 2023-04-05 ENCOUNTER — Telehealth: Payer: Medicaid Other | Admitting: Psychiatry

## 2023-04-12 ENCOUNTER — Other Ambulatory Visit: Payer: Self-pay

## 2023-04-12 ENCOUNTER — Emergency Department: Payer: Medicaid Other

## 2023-04-12 ENCOUNTER — Emergency Department
Admission: EM | Admit: 2023-04-12 | Discharge: 2023-04-12 | Disposition: A | Discharge status: Home / Self Care | Payer: Medicaid Other | Attending: Emergency Medicine | Admitting: Emergency Medicine

## 2023-04-12 ENCOUNTER — Encounter: Payer: Self-pay | Admitting: Emergency Medicine

## 2023-04-12 DIAGNOSIS — R079 Chest pain, unspecified: Secondary | ICD-10-CM

## 2023-04-12 DIAGNOSIS — R0789 Other chest pain: Secondary | ICD-10-CM | POA: Diagnosis not present

## 2023-04-12 DIAGNOSIS — J45909 Unspecified asthma, uncomplicated: Secondary | ICD-10-CM | POA: Diagnosis not present

## 2023-04-12 DIAGNOSIS — J029 Acute pharyngitis, unspecified: Secondary | ICD-10-CM | POA: Diagnosis not present

## 2023-04-12 DIAGNOSIS — R0602 Shortness of breath: Secondary | ICD-10-CM | POA: Insufficient documentation

## 2023-04-12 LAB — BASIC METABOLIC PANEL
Anion gap: 7 (ref 5–15)
BUN: 9 mg/dL (ref 6–20)
CO2: 24 mmol/L (ref 22–32)
Calcium: 9.2 mg/dL (ref 8.9–10.3)
Chloride: 110 mmol/L (ref 98–111)
Creatinine, Ser: 0.76 mg/dL (ref 0.44–1.00)
GFR, Estimated: >60 mL/min (ref >60)
Glucose, Bld: 104 mg/dL — ABNORMAL HIGH (ref 70–99)
Potassium: 3.7 mmol/L (ref 3.5–5.1)
Sodium: 141 mmol/L (ref 135–145)

## 2023-04-12 LAB — CBC
HCT: 38.8 % (ref 36.0–46.0)
Hemoglobin: 12 g/dL (ref 12.0–15.0)
MCH: 25.9 pg — ABNORMAL LOW (ref 26.0–34.0)
MCHC: 30.9 g/dL (ref 30.0–36.0)
MCV: 83.8 fL (ref 80.0–100.0)
Platelets: 241 10*3/uL (ref 150–400)
RBC: 4.63 MIL/uL (ref 3.87–5.11)
RDW: 13.3 % (ref 11.5–15.5)
WBC: 4.5 10*3/uL (ref 4.0–10.5)
nRBC: 0 % (ref 0.0–0.2)

## 2023-04-12 LAB — TROPONIN I (HIGH SENSITIVITY): Troponin I (High Sensitivity): <2 ng/L (ref <18)

## 2023-04-12 NOTE — ED Notes (Signed)
States has had a cough for a week. Since Monday has had sore Throat. Has a CPAP Machine and with the cough did not use it for a week. Tried a new mask which her feel worse with her breathing. Not sure if that is causing her to feel like this.

## 2023-04-12 NOTE — ED Provider Notes (Signed)
[facility] Provider Note   Event Date/Time   First MD Initiated Contact with Patient 04/12/23 1107     (approximate) History  Chest Pain  HPI Jasmine Gordon is a 37 y.o. female with a past medical history of anxiety/depression, mild asthma, PTSD, and anxiety who presents complaining of of generalized chest pain, shortness of breath, and sore throat after starting a CPAP machine approximately 1 week prior to arrival.  Patient denies any cough, fever, recent travel, or sick contacts.  Patient denies any exertional worsening of this pain.  Patient describes his pain as left upper chest wall and associated with sore throat mostly on the left. ROS: Patient currently denies any vision changes, tinnitus, difficulty speaking, facial droop, shortness of breath, abdominal pain, nausea/vomiting/diarrhea, dysuria, or weakness/numbness/paresthesias in any extremity   Physical Exam  Triage Vital Signs: ED Triage Vitals  Enc Vitals Group     BP 04/12/23 0824 (!) 134/91     Pulse Rate 04/12/23 0824 (!) 104     Resp 04/12/23 0824 18     Temp --      Temp src --      SpO2 04/12/23 0824 99 %     Weight 04/12/23 0819 257 lb (116.6 kg)     Height 04/12/23 0819 5\' 7"  (1.702 m)     Head Circumference --      Peak Flow --      Pain Score 04/12/23 0819 7     Pain Loc --      Pain Edu? --      Excl. in GC? --    Most recent vital signs: Vitals:   04/12/23 0824  BP: (!) 134/91  Pulse: (!) 104  Resp: 18  SpO2: 99%   General: Awake, oriented x4. CV:  Good peripheral perfusion.  Resp:  Normal effort.  Abd:  No distention.  Other:  Middle-aged morbidly obese African-American female laying in bed in no acute distress.  Erythematous posterior oropharynx ED Results / Procedures / Treatments  Labs (all labs ordered are listed, but only abnormal results are displayed) Labs Reviewed  BASIC METABOLIC PANEL - Abnormal; Notable for the following components:      Result Value    Glucose, Bld 104 (*)    All other components within normal limits  CBC - Abnormal; Notable for the following components:   MCH 25.9 (*)    All other components within normal limits  POC URINE PREG, ED  TROPONIN I (HIGH SENSITIVITY)  TROPONIN I (HIGH SENSITIVITY)   EKG ED ECG REPORT I, Merwyn Katos, the attending physician, personally viewed and interpreted this ECG. Date: 04/12/2023 EKG Time: 0820 Rate: 101 Rhythm: normal sinus rhythm QRS Axis: normal Intervals: normal ST/T Wave abnormalities: normal Narrative Interpretation: no evidence of acute ischemia RADIOLOGY ED MD interpretation: 2 view chest x-ray interpreted by me shows no evidence of acute abnormalities including no pneumonia, pneumothorax, or widened mediastinum -Agree with radiology assessment Official radiology report(s): DG Chest 2 View  Result Date: 04/12/2023 CLINICAL DATA:  Chest pain EXAM: CHEST - 2 VIEW COMPARISON:  11/30/2022 FINDINGS: Normal heart size and mediastinal contours. No acute infiltrate or edema. No effusion or pneumothorax. No acute osseous findings. IMPRESSION: Negative chest. Electronically Signed   By: Tiburcio Pea M.D.   On: 04/12/2023 08:32   PROCEDURES: Critical Care performed: No .1-3 Lead EKG Interpretation  Performed by: Merwyn Katos, MD Authorized by: Merwyn Katos, MD     Interpretation:  normal     ECG rate:  71   ECG rate assessment: normal     Rhythm: sinus rhythm     Ectopy: none     Conduction: normal    MEDICATIONS ORDERED IN ED: Medications - No data to display IMPRESSION / MDM / ASSESSMENT AND PLAN / ED COURSE  I reviewed the triage vital signs and the nursing notes.                             The patient is on the cardiac monitor to evaluate for evidence of arrhythmia and/or significant heart rate changes. Patient's presentation is most consistent with acute presentation with potential threat to life or bodily function. This patient presents with  atypical chest pain. Differential diagnosis includes rib fracture, costochondritis, sternal fracture. Low suspicion for ACS, acute PE, pericarditis / myocarditis, thoracic aortic dissection, pneumothorax, pneumonia or other acute infectious process. Presentation not consistent with other acute, emergent causes of chest pain at this time. No indication for cardiac enzyme testing. Plan to order CXR to evaluate for acute cardiopulmonary causes.  Plan: EKG, CXR, pain control, CBC, troponin Significant results: None Dispo: Discharge home with home care   FINAL CLINICAL IMPRESSION(S) / ED DIAGNOSES   Final diagnoses:  Chest pain, unspecified type  Sore throat   Rx / DC Orders   ED Discharge Orders     None      Note:  This document was prepared using Dragon voice recognition software and may include unintentional dictation errors.   Merwyn Katos, MD 04/12/23 1311

## 2023-04-12 NOTE — Discharge Instructions (Addendum)
Please start using Zyrtec (cetirizine) or Allegra (fexofenadine) for the next 3-5 days to note any improvement in your symptoms

## 2023-04-12 NOTE — ED Triage Notes (Signed)
Pt via POV from home. Pt c/o generalized chest pain, SOB, and sore throat. States she just started using a CPAP machine. Denies cough. Denies fever. Pt is A&Ox4 and NAD.
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
## 2023-05-09 ENCOUNTER — Ambulatory Visit: Payer: Medicaid Other | Admitting: Family Medicine

## 2023-05-09 ENCOUNTER — Telehealth: Payer: Self-pay

## 2023-05-09 ENCOUNTER — Encounter: Payer: Self-pay | Admitting: Family Medicine

## 2023-05-09 VITALS — BP 122/84 | HR 94 | Ht 64.0 in | Wt 262.0 lb

## 2023-05-09 DIAGNOSIS — R7611 Nonspecific reaction to tuberculin skin test without active tuberculosis: Secondary | ICD-10-CM

## 2023-05-09 NOTE — Telephone Encounter (Signed)
Referral from [facility] for positive PPD. Pt reports a personal hx of latent TB and took 6 months of treatment in [place].    Call for EPI and refer for CXR if she hasn't already had one since we don't have documentation of history.   Or maybe she can get [place] the documentation.

## 2023-05-09 NOTE — Progress Notes (Signed)
Subjective:    Patient ID: Jasmine Gordon, female    DOB: 1986-06-01, 37 y.o.   MRN: [id number]  Jasmine Gordon is a 37 y.o. female presenting on 05/09/2023 for Medical Management of Chronic Issues   HPI  Follow-up of Positive PPD TB Skin Test. The test was placed on 05/05/23 and read on 05/08/23 with interpretation of POSITIVE result.  She has past history of positive PPD Skin and blood test in the past. She has been treated back in 2007 for latent TB with 9 months INH treatment. This has been completed and no further issues since that time.  She remains asymptomatic at this time.  She has had a recent Chest X-ray done at the [facility] ED on 04/12/23. Result was NEGATIVE. Therefore, given the recent chest x-ray, she is cleared to proceed without any further testing or treatment at this time based on the Positive PPD      03/07/2023   10:08 AM 09/16/2022    1:12 PM 07/12/2022   11:56 AM  Depression screen PHQ 2/9  Decreased Interest 1 1   Down, Depressed, Hopeless 0 1   PHQ - 2 Score 1 2   Altered sleeping 1 1   Tired, decreased energy 0 1   Change in appetite 0 1   Feeling bad or failure about yourself  1 2   Trouble concentrating 1 0   Moving slowly or fidgety/restless 0 0   Suicidal thoughts 0 0   PHQ-9 Score 4 7   Difficult doing work/chores Somewhat difficult Somewhat difficult      Information is confidential and restricted. Go to Review Flowsheets to unlock data.    Social History   Tobacco Use   Smoking status: Never   Smokeless tobacco: Never  Vaping Use   Vaping Use: Never used  Substance Use Topics   Alcohol use: Not Currently   Drug use: No    Review of Systems Per HPI unless specifically indicated above     Objective:    BP 122/84   Pulse 94   Ht 5\' 4"  (1.626 m)   Wt 262 lb (118.8 kg)   SpO2 100%   BMI 44.97 kg/m   Wt Readings from Last 3 Encounters:  05/09/23 262 lb (118.8 kg)  04/12/23 257 lb (116.6 kg)  03/07/23 258 lb  3.2 oz (117.1 kg)    Physical Exam Vitals and nursing note reviewed.  Constitutional:      General: She is not in acute distress.    Appearance: Normal appearance. She is well-developed. She is not diaphoretic.     Comments: Well-appearing, comfortable, cooperative  HENT:     Head: Normocephalic and atraumatic.  Eyes:     General:        Right eye: No discharge.        Left eye: No discharge.     Conjunctiva/sclera: Conjunctivae normal.  Cardiovascular:     Rate and Rhythm: Normal rate.  Pulmonary:     Effort: Pulmonary effort is normal.  Skin:    General: Skin is warm and dry.     Findings: No erythema or rash.     Comments: Localized Left inner forearm induration from PPD still present  Neurological:     Mental Status: She is alert and oriented to person, place, and time.  Psychiatric:        Mood and Affect: Mood normal.        Behavior: Behavior normal.  Thought Content: Thought content normal.     Comments: Well groomed, good eye contact, normal speech and thoughts    Results for orders placed or performed during the hospital encounter of 04/12/23  Basic metabolic panel  Result Value Ref Range   Sodium 141 135 - 145 mmol/L   Potassium 3.7 3.5 - 5.1 mmol/L   Chloride 110 98 - 111 mmol/L   CO2 24 22 - 32 mmol/L   Glucose, Bld 104 (H) 70 - 99 mg/dL   BUN 9 6 - 20 mg/dL   Creatinine, Ser 1.61 0.44 - 1.00 mg/dL   Calcium 9.2 8.9 - 09.6 mg/dL   GFR, Estimated >04 >54 mL/min   Anion gap 7 5 - 15  CBC  Result Value Ref Range   WBC 4.5 4.0 - 10.5 K/uL   RBC 4.63 3.87 - 5.11 MIL/uL   Hemoglobin 12.0 12.0 - 15.0 g/dL   HCT 09.8 11.9 - 14.7 %   MCV 83.8 80.0 - 100.0 fL   MCH 25.9 (L) 26.0 - 34.0 pg   MCHC 30.9 30.0 - 36.0 g/dL   RDW 82.9 56.2 - 13.0 %   Platelets 241 150 - 400 K/uL   nRBC 0.0 0.0 - 0.2 %  Troponin I (High Sensitivity)  Result Value Ref Range   Troponin I (High Sensitivity) <2 <18 ng/L      Assessment & Plan:   Problem List Items  Addressed This Visit     Positive TB test - Primary    See letter today PPD Positive 6/17 [facility] Reviewed CXR from ED 04/12/23, negative She is asymptomatic Prior long history of Latent TB treated 2007 w/ 9 month INH She has had chronic positive PPD / Juanetta Gosling lab test since that time. With negative chest x-rays and asymptomatic Letter today clears her to return to work, using last Chest X-ray. No repeat new x-ray indicated.  No orders of the defined types were placed in this encounter.     Follow up plan: Return if symptoms worsen or fail to improve.   [name], DO [facility] 05/09/2023, 1:58 PM

## 2023-05-09 NOTE — Patient Instructions (Addendum)
Thank you for coming to the office today.  See letter today. Last negative CXR 5/22. Cleared to resume work.   Please schedule a Follow-up Appointment to: Return if symptoms worsen or fail to improve.  If you have any other questions or concerns, please feel free to call the office or send a message through MyChart. You may also schedule an earlier appointment if necessary.  Additionally, you may be receiving a survey about your experience at our office within a few days to 1 week by e-mail or mail. We value your feedback.  [name], DO [facility], [place]

## 2023-05-09 NOTE — Telephone Encounter (Signed)
Phone call to pt at [phone]. Left message requesting return call to [place], RN with [facility]. Received referral from [facility]. Wondering if she could provided name of facility in [place] that worked with her previously re latent TB or if she had copies of previous records; would like to go over some information and details. Please call Bethany Hirt at [phone].

## 2023-05-09 NOTE — Telephone Encounter (Signed)
It looks like there is another phone call conversation with [facility] health Dept today also on this same topic, they received request for Chest X-ray due to positive PPD.  She has had this history before.  We can do Chest X-ray, however. She has had recent Chest X-ray already.  That should be more than sufficient to meet their requirement. Less than 1 month ago.  Last was done 04/12/23 in ED.  CLINICAL DATA:  Chest pain   EXAM: CHEST - 2 VIEW   COMPARISON:  11/30/2022   FINDINGS: Normal heart size and mediastinal contours. No acute infiltrate or edema. No effusion or pneumothorax. No acute osseous findings.   IMPRESSION: Negative chest.     Electronically Signed   By: Tiburcio Pea M.D.   On: 04/12/2023 08:32  [name], DO [facility] 05/09/2023, 12:44 PM

## 2023-05-09 NOTE — Telephone Encounter (Signed)
Copied from CRM [id number]. Topic: General - Other >> May 08, 2023 10:21 AM Epimenio Foot F wrote: Reason for CRM: Pt is calling in because she was told to have her employer order the Xray and her employer was unable to do it. Pt says her employer is unable to order it and she needs Dr. Kirtland Bouchard to order it. Please advise.

## 2023-05-12 NOTE — Telephone Encounter (Signed)
Phone call placed to [phone] for [facility].  Hours of operation are Monkday-Thursday. Unable to get to an appropriate voicemail in the phone system.

## 2023-05-12 NOTE — Telephone Encounter (Signed)
Received return call from pt. Pt states she had a + PPD in 2007, negative chest x-ray negative, and INH for 9 months. This was done with [place], [place]. She contacted them and they stated those would have been paper records and did not attempt to locate them for her.  She is searching her home for a little card that they gave her. She usually has it in her wallet or purse. Has info re PPD, chest x-ray and INH.  She has asthma, so she has had a few chest x-rays since then, no indications of TB.  04/12/23 chest x-ray in epic.

## 2023-05-15 ENCOUNTER — Encounter: Payer: Medicaid Other | Admitting: Psychiatry

## 2023-05-15 ENCOUNTER — Telehealth: Payer: Self-pay | Admitting: Psychiatry

## 2023-05-15 ENCOUNTER — Other Ambulatory Visit: Payer: Medicaid Other | Admitting: Obstetrics and Gynecology

## 2023-05-15 NOTE — Telephone Encounter (Addendum)
I sent a video visit link through Epic, but the patient did not sign in. I attempted to call for today's appointment. The patient mentioned she cannot proceed with the visit today due to new work commitments. She forgot about this appointment. She has agreed to contact the front desk to reschedule.

## 2023-05-15 NOTE — Telephone Encounter (Addendum)
Phone call placed to [phone] for [facility]. Requested to speak to someone regarding TB archived records. Spoke with Mr. Jasmine Gordon, stated he would be sending an email with some general information (NO pt info).  Jasmine Gordon Fax #  [phone]  Request for TB records sent to Mr. [name] 05/15/23 (PPD, x-ray, INH records, Epi).

## 2023-05-15 NOTE — Patient Instructions (Signed)
Hi [name], I hope you are doing okay today and am sorry I missed you today  - as a part of your Medicaid benefit, you are eligible for care management and care coordination services at no cost or copay. I was unable to reach you by phone today but would be happy to help you with your health related needs. Please feel free to call me at [phone].  A member of the Managed Medicaid care management team will reach out to you again over the next 30 business days.   [name] RN, BSN [place]  [employer], production - Managed Medicaid High Risk [phone]

## 2023-05-15 NOTE — Patient Outreach (Signed)
  Medicaid Managed Care   Unsuccessful Attempt Note   05/15/2023 Name: Jasmine Gordon MRN: [id number] DOB: 1986/10/09  Referred by: [name], DO Reason for referral : High Risk Managed Medicaid (Unsuccessful telephone outreach)  An unsuccessful telephone outreach was attempted today. The patient was referred to the case management team for assistance with care management and care coordination.    Follow Up Plan: The Managed Medicaid care management team will reach out to the patient again over the next 30 business  days. and The  Patient has been provided with contact information for the Managed Medicaid care management team and has been advised to call with any health related questions or concerns.   [name] RN, BSN [place]  [employer], production - Managed Medicaid High Risk [phone]

## 2023-05-15 NOTE — Progress Notes (Signed)
This encounter was created in error - please disregard.

## 2023-05-19 NOTE — Telephone Encounter (Signed)
Phone call to pt at [phone]. Pt stated she was at work.  Pt informed that historical records were requested from [facility], [place]. Have not received them yet, but since they were archived not sure if we will get them.  States she could not find her TB/LTBI completion card she received from [facility], [place]. It may be in a storage unit.  When asked about a good time to complete the Epi form, pt stated she works M-F, basically 8-5. States Monday, 03/22/23, at 12:00 would be good time to complete Epi. States her PCP already provided her with TB documents for her work.

## 2023-05-22 NOTE — Telephone Encounter (Addendum)
Phone call to pt at [phone]. Left message stating that RN with [facility] is calling re appt we scheduled to go over hx/TB questions; [facility] has not received any info from [facility], [place] as this time. Calling toble to complete TB documents, please call Jnai [place] at [phone].

## 2023-05-23 NOTE — Telephone Encounter (Signed)
Phone call to pt at [phone]. Left voicemail that RN with [facility] is calling, trying to get her while she may be on lunch break, just want to get everything completed. May try back tomorrow during lunch time or can call Rimas Gilham when available at [phone].

## 2023-05-24 ENCOUNTER — Encounter: Payer: Self-pay | Admitting: Hematology and Oncology

## 2023-05-24 ENCOUNTER — Ambulatory Visit: Payer: Self-pay

## 2023-05-24 VITALS — Wt 262.0 lb
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
## 2023-06-01 ENCOUNTER — Encounter: Payer: Self-pay | Admitting: Obstetrics and Gynecology

## 2023-06-01 ENCOUNTER — Other Ambulatory Visit: Payer: Medicaid Other | Admitting: Obstetrics and Gynecology

## 2023-06-01 NOTE — Patient Outreach (Signed)
Medicaid Managed Care   Nurse Care Manager Note  06/01/2023 Name:  Jasmine Gordon MRN:  [id number] DOB:  [birth year]  Jasmine Gordon is an 37 y.o. year old female who is a primary patient of Jasmine Cords, DO.  The [facility] Managed Care Coordination team was consulted for assistance with:    Chronic healthcare management needs, asthma, anxiety/depression/PTSD/PA, HLD, anemia, migraines  [name] was given information about Medicaid Managed Care Coordination team services today. Jasmine Gordon Patient agreed to services and verbal consent obtained.  Engaged with patient by telephone for follow up visit in response to provider referral for case management and/or care coordination services.   Assessments/Interventions:  Review of past medical history, allergies, medications, health status, including review of consultants reports, laboratory and other test data, was performed as part of comprehensive evaluation and provision of chronic care management services.  SDOH (Social Determinants of Health) assessments and interventions performed: SDOH Interventions    Flowsheet Row Patient Outreach Telephone from 06/01/2023 in [place] [facility] DEPARTMENT Patient Outreach Telephone from 03/15/2023 in [place] POPULATION HEALTH DEPARTMENT Patient Outreach Telephone from 03/06/2023 in [place] POPULATION HEALTH DEPARTMENT Patient Outreach Telephone from 02/01/2023 in [place] POPULATION HEALTH DEPARTMENT Telephone from 12/02/2022 in [place] POPULATION HEALTH DEPARTMENT Patient Outreach Telephone from 11/01/2022 in [place] POPULATION HEALTH DEPARTMENT  SDOH Interventions        Food Insecurity Interventions -- -- Intervention Not Indicated -- -- --  Housing Interventions -- -- Intervention Not Indicated -- -- --  Transportation Interventions -- Intervention Not Indicated -- -- Intervention Not Indicated --  Utilities Interventions -- -- --  Intervention Not Indicated -- --  Financial Strain Interventions -- -- -- -- -- Intervention Not Indicated  Physical Activity Interventions Intervention Not Indicated -- -- -- -- Intervention Not Indicated  Stress Interventions -- -- -- Other (Comment)  [enrolled in counseling] -- --  Health Literacy Interventions Intervention Not Indicated -- -- -- -- --     Care Plan  Allergies  Allergen Reactions   Reglan [Metoclopramide] Hives and Shortness Of Breath   Contrave [Naltrexone-Bupropion Hcl Er] Other (See Comments)    Had some reaction to the medication   Medications Reviewed Today     Reviewed by [name], RN (Registered Nurse) on 06/01/23 at 1328  Med List Status: <None>   Medication Order Taking? Sig Documenting Provider Last Dose Status Informant  albuterol (PROVENTIL) (2.5 MG/3ML) 0.083% nebulizer solution [id number] No Take 3 mLs (2.5 mg total) by nebulization every 4 (four) hours as needed for wheezing or shortness of breath.  Patient not taking: Reported on 05/24/2023   Merwyn Katos, MD Not Taking Active   albuterol (VENTOLIN HFA) 108 (90 Base) MCG/ACT inhaler [id number] No Inhale 2 puffs into the lungs every 6 (six) hours as needed for wheezing or shortness of breath. Jasmine Cords, DO Taking Active   cetirizine (ZYRTEC) 10 MG tablet [id number] No Take 10 mg by mouth daily as needed for allergies. [provider] Taking Active   chlorhexidine (PERIDEX) 0.12 % solution [id number] No Use as directed 10 mLs in the mouth or throat. [provider] Taking Active   escitalopram (LEXAPRO) 10 MG tablet [id number] No Take 1 tablet (10 mg total) by mouth daily. Jasmine Hotter, MD Taking Expired 04/08/23 2359   fluticasone (FLONASE) 50 MCG/ACT nasal spray [id number] No Place 1 spray into both nostrils daily. Use for 4-6 weeks then stop and use seasonally or  as needed. Jasmine Munroe, NP Taking Active   hydrOXYzine (ATARAX) 10 MG tablet [id number] No Take 1  tablet (10 mg total) by mouth 3 (three) times daily as needed for anxiety. Jasmine, Netta Neat, DO Taking Active   montelukast (SINGULAIR) 10 MG tablet [id number] No Take 10 mg by mouth at bedtime as needed. [provider] Taking Active   Multiple Vitamins-Minerals (ONE-A-DAY WOMENS PO) [id number] No Take 1 tablet by mouth daily. [provider] Taking Active   Naltrexone-buPROPion HCl ER (CONTRAVE) 8-90 MG TB12 [id number] No Start 1 tablet every morning for 7 days, then 1 tablet twice daily for 7 days, then 2 tablets every morning and one every evening  Patient not taking: Reported on 05/24/2023   Jasmine Cords, DO Not Taking Active   Spacer/Aero-Holding Chambers (AEROCHAMBER MV) inhaler [id number] No Use as instructed  Patient not taking: Reported on 05/24/2023   Jasmine Cords, DO Not Taking Active   Vitamin D, Ergocalciferol, (DRISDOL) 1.25 MG (50000 UNIT) CAPS capsule [id number] No Take 1 capsule (50,000 Units total) by mouth every 7 (seven) days. Jasmine Cords, DO Taking Active            Patient Active Problem List   Diagnosis Date Noted   Dysplasia of cervix, high grade CIN 2 02/23/2023   High grade squamous intraepithelial lesion (HGSIL), grade 3 CIN, on biopsy of cervix 02/23/2023   Vitamin B12 deficiency 06/09/2022   Palpitations 01/21/2022   Shortness of breath 01/21/2022   Chest pain 01/21/2022   Generalized anxiety disorder with panic attacks 09/27/2021   Environmental and seasonal allergies 11/04/2020   Genital warts 09/11/2018   Positive TB test 09/25/2017   Allergic contact dermatitis due to metals 05/17/2017   Pre-diabetes 03/06/2017   Hyperlipidemia 03/06/2017   Allergic reaction 02/08/2017   Urticaria due to food allergy 02/08/2017   Vitamin D deficiency 11/08/2016   Iron deficiency anemia due to chronic blood loss 10/03/2016   Menorrhagia with regular cycle 06/27/2016   Mild persistent asthma without complication  06/17/2016   Anxiety and depression 06/17/2016   Morbid obesity (HCC) 06/17/2016   PTSD (post-traumatic stress disorder) 2016   Conditions to be addressed/monitored per PCP order:  Chronic healthcare management needs, asthma, anxiety/depression/PTSD/PA, HLD, anemia, migraines  Care Plan : RN Care Manager Plan of Care  Updates made by [name], RN since 06/01/2023 12:00 AM     Problem: Health Promotion or Disease Self-Management (General Plan of Care)      Long-Range Goal: Chronic Disease Management and Care Coordination Needs   Expected End Date: 06/14/2023  Priority: High  Note:   Current Barriers:  Knowledge Deficits related to plan of care for management of Asthma, anxiety/depression/PTSD, HLD  Chronic Disease Management support and education needs related to Asthma, anxiety/depression/PTSD, HLD 06/01/23: Patient states CPAP not helpful-she will f/u with PULM on 7/30.  Has started new job as needed full time due to disability being denied again.  Concerned about weight-has started exercising more, does not want meds-did discuss nutrition referral.  RNCM Clinical Goal(s):  Patient will verbalize understanding of plan for management of Asthma, anxiety as evidenced by patient report verbalize basic understanding of  Asthma disease process and self health management plan as evidenced by patient report take all medications exactly as prescribed and will call provider for medication related questions as evidenced by patient report demonstrate understanding of rationale for each prescribed medication as evidenced by patient report attend all scheduled medical appointments: as  evidenced by patient report continue to work with RN Care Manager to address care management and care coordination needs related to  Asthma as evidenced by adherence to CM Team Scheduled appointments work with social worker to address  related to the management of Mental Health Concerns  related to the management of  Anxiety, Depression, and PTSD, panic attacks as evidenced by review of EMR and patient or social worker report  Interventions: Inter-disciplinary care team collaboration (see longitudinal plan of care) Evaluation of current treatment plan related to  self management and patient's adherence to plan as established by provider 06/01/23:  discussed nutrition referral  Hyperlipidemia Interventions:  (Status:  New goal.) Long Term Goal Medication review performed; medication list updated in electronic medical record.  Counseled on importance of regular laboratory monitoring as prescribed Assessed social determinant of health barriers    Asthma: (Status:New goal.) Long Term Goal Advised patient to track and manage Asthma triggers Advised patient to self assesses Asthma action plan zone and make appointment with provider if in the yellow zone for 48 hours without improvement Discussed the importance of adequate rest and management of fatigue with Asthma Assessed social determinant of health barriers  Collaborated with SW SW referral for anxiety/depression/panic attacks-completed  Patient Goals/Self-Care Activities: Take all medications as prescribed Attend all scheduled provider appointments Call pharmacy for medication refills 3-7 days in advance of running out of medications Perform all self care activities independently  Perform IADL's (shopping, preparing meals, housekeeping, managing finances) independently Call provider office for new concerns or questions  Work with the social worker to address care coordination needs and will continue to work with the clinical team to address health care and disease management related needs Patient to contact Pharmacy and/or Provider regarding medication Patient to follow up on sleep study-completed  Follow Up Plan:  The patient has been provided with contact information for the care management team and has been advised to call with any health related  questions or concerns.  The care management team will reach out to the patient again over the next 60 business  days.    Long-Range Goal: Establish Plan of Care for Chronic Disease Management Needs   Priority: High  Note:   Timeframe:  Long-Range Goal Priority:  High Start Date:   03/07/22                          Expected End Date:     ongoing                  Follow Up Date 07/03/23   - schedule appointment for flu shot - schedule appointment for vaccines needed due to my age or health - schedule recommended health tests (blood work, mammogram, colonoscopy, pap test) - schedule and keep appointment for annual check-up    Why is this important?   Screening tests can find diseases early when they are easier to treat.  Your doctor or nurse will talk with you about which tests are important for you.  Getting shots for common diseases like the flu and shingles will help prevent them.  06/01/23:  seen and evaluated by PCP 6/18, Psychiatry 6/24.  To see PULM 7/30 and have iron infusion 7/24.   Follow Up:  Patient agrees to Care Plan and Follow-up.  Plan: The Managed Medicaid care management team will reach out to the patient again over the next 30 business  days. and The  Patient has been provided  with contact information for the Managed Medicaid care management team and has been advised to call with any health related questions or concerns.  Date/time of next scheduled RN care management/care coordination outreach:  07/03/23 at 1030

## 2023-06-01 NOTE — Patient Instructions (Signed)
Visit Information  [name] was given information about Medicaid Managed Care team care coordination services as a part of their Healthy [facility] Medicaid benefit. Jasmine Gordon verbally consented to engagement with the [facility] Managed Care team.   If you are experiencing a medical emergency, please call 911 or report to your local emergency department or urgent care.   If you have a non-emergency medical problem during routine business hours, please contact your provider's office and ask to speak with a nurse.   For questions related to your Healthy [facility] health plan, please call: [phone] or visit the homepage here: [email or web address]  If you would like to schedule transportation through your Healthy [facility] plan, please call the following number at least 2 days in advance of your appointment: [phone]  For information about your ride after you set it up, call Ride Assist at [phone]. Use this number to activate a Will Call pickup, or if your transportation is late for a scheduled pickup. Use this number, too, if you need to make a change or cancel a previously scheduled reservation.  If you need transportation services right away, call [phone]. The after-hours call center is staffed 24 hours to handle ride assistance and urgent reservation requests (including discharges) 365 days a year. Urgent trips include sick visits, hospital discharge requests and life-sustaining treatment.  Call the [facility] Line at [phone], at any time, 24 hours a day, 7 days a week. If you are in danger or need immediate medical attention call 911.  If you would like help to quit smoking, call 1-800-QUIT-NOW (626-495-5477) OR Espaol: 1-855-Djelo-Ya (4-742-595-6387) o para ms informacin haga clic aqu or Text READY to 564-332 to register via text  [name] following are the goals we discussed in your visit  today:   Goals Addressed    Timeframe:  Long-Range Goal Priority:  High Start Date:   03/07/22                          Expected End Date:     ongoing                  Follow Up Date 07/03/23   - schedule appointment for flu shot - schedule appointment for vaccines needed due to my age or health - schedule recommended health tests (blood work, mammogram, colonoscopy, pap test) - schedule and keep appointment for annual check-up    Why is this important?   Screening tests can find diseases early when they are easier to treat.  Your doctor or nurse will talk with you about which tests are important for you.  Getting shots for common diseases like the flu and shingles will help prevent them.  06/01/23:  seen and evaluated by PCP 6/18, Psychiatry 6/24.  To see PULM 7/30 and have iron infusion 7/24.  Patient verbalizes understanding of instructions and care plan provided today and agrees to view in MyChart. Active MyChart status and patient understanding of how to access instructions and care plan via MyChart confirmed with patient.     The Managed Medicaid care management team will reach out to the patient again over the next 30 business  days.  The  Patient  has been provided with contact information for the Managed Medicaid care management team and has been advised to call with any health related questions or concerns.   [name] RN, BSN [place]  [facility] [employer]  Management Coordinator - Managed Medicaid High Risk [phone]   Following is a copy of your plan of care:  Care Plan : RN Care Manager Plan of Care  Updates made by [name], RN since 06/01/2023 12:00 AM     Problem: Health Promotion or Disease Self-Management (General Plan of Care)      Long-Range Goal: Chronic Disease Management and Care Coordination Needs   Expected End Date: 06/14/2023  Priority: High  Note:   Current Barriers:  Knowledge Deficits related to plan of care for management  of Asthma, anxiety/depression/PTSD, HLD  Chronic Disease Management support and education needs related to Asthma, anxiety/depression/PTSD, HLD 06/01/23: Patient states CPAP not helpful-she will f/u with PULM on 7/30.  Has started new job as needed full time due to disability being denied again.  Concerned about weight-has started exercising more, does not want meds-did discuss nutrition referral.  RNCM Clinical Goal(s):  Patient will verbalize understanding of plan for management of Asthma, anxiety as evidenced by patient report verbalize basic understanding of  Asthma disease process and self health management plan as evidenced by patient report take all medications exactly as prescribed and will call provider for medication related questions as evidenced by patient report demonstrate understanding of rationale for each prescribed medication as evidenced by patient report attend all scheduled medical appointments: as evidenced by patient report continue to work with RN Care Manager to address care management and care coordination needs related to  Asthma as evidenced by adherence to CM Team Scheduled appointments work with social worker to address  related to the management of Mental Health Concerns  related to the management of Anxiety, Depression, and PTSD, panic attacks as evidenced by review of EMR and patient or social worker report  Interventions: Inter-disciplinary care team collaboration (see longitudinal plan of care) Evaluation of current treatment plan related to  self management and patient's adherence to plan as established by provider 06/01/23:  discussed nutrition referral  Hyperlipidemia Interventions:  (Status:  New goal.) Long Term Goal Medication review performed; medication list updated in electronic medical record.  Counseled on importance of regular laboratory monitoring as prescribed Assessed social determinant of health barriers    Asthma: (Status:New goal.) Long Term  Goal Advised patient to track and manage Asthma triggers Advised patient to self assesses Asthma action plan zone and make appointment with provider if in the yellow zone for 48 hours without improvement Discussed the importance of adequate rest and management of fatigue with Asthma Assessed social determinant of health barriers  Collaborated with SW SW referral for anxiety/depression/panic attacks-completed  Patient Goals/Self-Care Activities: Take all medications as prescribed Attend all scheduled provider appointments Call pharmacy for medication refills 3-7 days in advance of running out of medications Perform all self care activities independently  Perform IADL's (shopping, preparing meals, housekeeping, managing finances) independently Call provider office for new concerns or questions  Work with the social worker to address care coordination needs and will continue to work with the clinical team to address health care and disease management related needs Patient to contact Pharmacy and/or Provider regarding medication Patient to follow up on sleep study-completed  Follow Up Plan:  The patient has been provided with contact information for the care management team and has been advised to call with any health related questions or concerns.  The care management team will reach out to the patient again over the next 60 business  days.

## 2023-06-12 ENCOUNTER — Encounter: Payer: Self-pay | Admitting: Family Medicine

## 2023-06-12 ENCOUNTER — Inpatient Hospital Stay: Payer: Medicaid Other | Attending: Oncology

## 2023-06-12 ENCOUNTER — Encounter: Payer: Self-pay | Admitting: Oncology

## 2023-06-12 DIAGNOSIS — D500 Iron deficiency anemia secondary to blood loss (chronic): Secondary | ICD-10-CM | POA: Insufficient documentation

## 2023-06-12 DIAGNOSIS — Z79899 Other long term (current) drug therapy: Secondary | ICD-10-CM | POA: Insufficient documentation

## 2023-06-12 DIAGNOSIS — E538 Deficiency of other specified B group vitamins: Secondary | ICD-10-CM | POA: Insufficient documentation

## 2023-06-12 LAB — CBC WITH DIFFERENTIAL/PLATELET
Abs Immature Granulocytes: 0.01 10*3/uL (ref 0.00–0.07)
Basophils Absolute: 0 10*3/uL (ref 0.0–0.1)
Basophils Relative: 1 %
Eosinophils Absolute: 0.1 10*3/uL (ref 0.0–0.5)
Eosinophils Relative: 2 %
HCT: 36.4 % (ref 36.0–46.0)
Hemoglobin: 11.5 g/dL — ABNORMAL LOW (ref 12.0–15.0)
Immature Granulocytes: 0 %
Lymphocytes Relative: 41 %
Lymphs Abs: 1.7 10*3/uL (ref 0.7–4.0)
MCH: 26.3 pg (ref 26.0–34.0)
MCHC: 31.6 g/dL (ref 30.0–36.0)
MCV: 83.1 fL (ref 80.0–100.0)
Monocytes Absolute: 0.3 10*3/uL (ref 0.1–1.0)
Monocytes Relative: 7 %
Neutro Abs: 2.1 10*3/uL (ref 1.7–7.7)
Neutrophils Relative %: 49 %
Platelets: 239 10*3/uL (ref 150–400)
RBC: 4.38 MIL/uL (ref 3.87–5.11)
RDW: 12.8 % (ref 11.5–15.5)
WBC: 4.2 10*3/uL (ref 4.0–10.5)
nRBC: 0 % (ref 0.0–0.2)

## 2023-06-12 LAB — VITAMIN B12: Vitamin B-12: 224 pg/mL (ref 180–914)

## 2023-06-12 LAB — IRON AND TIBC
Iron: 98 ug/dL (ref 28–170)
Saturation Ratios: 30 % (ref 10.4–31.8)
TIBC: 323 ug/dL (ref 250–450)
UIBC: 225 ug/dL

## 2023-06-12 LAB — FERRITIN: Ferritin: 18 ng/mL (ref 11–307)

## 2023-06-13 MED FILL — Iron Sucrose Inj 20 MG/ML (Fe Equiv): INTRAVENOUS | Qty: 10 | Status: AC

## 2023-06-14 ENCOUNTER — Inpatient Hospital Stay: Payer: Medicaid Other | Admitting: Oncology

## 2023-06-14 ENCOUNTER — Inpatient Hospital Stay: Payer: Medicaid Other

## 2023-06-14 ENCOUNTER — Encounter: Payer: Self-pay | Admitting: Oncology

## 2023-06-14 VITALS — BP 132/69 | HR 105 | Temp 97.7°F | Resp 18 | Wt 265.4 lb

## 2023-06-14 DIAGNOSIS — D500 Iron deficiency anemia secondary to blood loss (chronic): Secondary | ICD-10-CM | POA: Diagnosis not present

## 2023-06-14 DIAGNOSIS — E538 Deficiency of other specified B group vitamins: Secondary | ICD-10-CM

## 2023-06-14 DIAGNOSIS — Z227 Latent tuberculosis: Secondary | ICD-10-CM | POA: Insufficient documentation

## 2023-06-14 DIAGNOSIS — Z79899 Other long term (current) drug therapy: Secondary | ICD-10-CM | POA: Diagnosis not present

## 2023-06-14 NOTE — Assessment & Plan Note (Signed)
Not able to tolerate  Vitamin B12 -  Recommend patient to take multivitamin.

## 2023-06-14 NOTE — Progress Notes (Signed)
Hematology/Oncology Progress note [phone] [phone]    Chief complaints: Follow up for iron deficiency anemia.  Assessment & Plan: Iron deficiency anemia due to chronic blood loss Labs are reviewed and discussed with patient. Lab Results  Component Value Date   HGB 11.5 (L) 06/12/2023   TIBC 323 06/12/2023   IRONPCTSAT 30 06/12/2023   FERRITIN 18 06/12/2023    Stable hemoglobin of 11.5, ferritin and iron saturation are stable. Labs are reviewed in details with patient. Shared decision was made not to proceed with IV Venofer treatment at this point due to stable counts.  Recommend patient to take multivitamin  Vitamin B12 deficiency Not able to tolerate  Vitamin B12 -  Recommend patient to take multivitamin.    The patient understands the plans discussed today and is in agreement with them.  Jasmine Gordon knows to contact our office if Jasmine Gordon develops concerns prior to her next appointment.  Follow up in 1 year.  [name], MD   Orders Placed This Encounter  Procedures   CBC with Differential (Cancer Center Only)    Standing Status:   Future    Standing Expiration Date:   06/13/2024   Iron and TIBC    Standing Status:   Future    Standing Expiration Date:   06/13/2024   Ferritin    Standing Status:   Future    Standing Expiration Date:   06/13/2024   Retic Panel    Standing Status:   Future    Standing Expiration Date:   06/13/2024        INTERVAL HISTORY Jasmine Gordon is a 37 y.o. female who has above history reviewed by me today presents for follow up visit for IDA and B12 deficiency  Jasmine Gordon reports feeling well.  Jasmine Gordon is concerned about abnormal UIBC.   Jasmine Gordon does not like B12 supplementation.   Review of Systems  Constitutional:  Negative for appetite change, chills, fatigue and fever.  HENT:   Negative for hearing loss and voice change.   Eyes:  Negative for eye problems.  Respiratory:  Negative for chest tightness and cough.    Cardiovascular:  Negative for chest pain.  Gastrointestinal:  Negative for abdominal distention, abdominal pain and blood in stool.  Endocrine: Negative for hot flashes.  Genitourinary:  Negative for difficulty urinating and frequency.   Musculoskeletal:  Negative for arthralgias.  Skin:  Negative for itching and rash.  Neurological:  Negative for extremity weakness.  Hematological:  Negative for adenopathy.  Psychiatric/Behavioral:  Negative for confusion.     VItal Blood pressure 132/69, pulse (!) 105, temperature 97.7 F (36.5 C), temperature source Tympanic, resp. rate 18, weight 265 lb 6.4 oz (120.4 kg), last menstrual period 05/04/2023, SpO2 100%.  Wt Readings from Last 3 Encounters:  06/14/23 265 lb 6.4 oz (120.4 kg)  05/24/23 262 lb (118.8 kg)  05/09/23 262 lb (118.8 kg)    Body mass index is 45.56 kg/m.  Physical Exam Constitutional:      General: Jasmine Gordon is not in acute distress.    Appearance: Jasmine Gordon is not diaphoretic.  HENT:     Head: Normocephalic and atraumatic.  Eyes:     General: No scleral icterus. Cardiovascular:     Rate and Rhythm: Normal rate and regular rhythm.  Pulmonary:     Effort: Pulmonary effort is normal. No respiratory distress.  Abdominal:     General: There is no distension.  Musculoskeletal:        General: Normal range of  motion.     Cervical back: Normal range of motion and neck supple.  Skin:    General: Skin is warm and dry.     Findings: No erythema.  Neurological:     Mental Status: Jasmine Gordon is alert and oriented to person, place, and time. Mental status is at baseline.     Cranial Nerves: No cranial nerve deficit.     Motor: No abnormal muscle tone.  Psychiatric:        Mood and Affect: Affect normal.      Labs     Latest Ref Rng & Units 06/12/2023    8:11 AM 04/12/2023    8:25 AM 02/27/2023    8:35 AM  CBC  WBC 4.0 - 10.5 K/uL 4.2  4.5  4.7   Hemoglobin 12.0 - 15.0 g/dL 16.1  09.6  04.5   Hematocrit 36.0 - 46.0 % 36.4  38.8  39.1    Platelets 150 - 400 K/uL 239  241  247       Latest Ref Rng & Units 04/12/2023    8:25 AM 02/27/2023    8:35 AM 11/30/2022    2:33 PM  CMP  Glucose 70 - 99 mg/dL 409  811  914   BUN 6 - 20 mg/dL 9  12  12    Creatinine 0.44 - 1.00 mg/dL 7.82  9.56  2.13   Sodium 135 - 145 mmol/L 141  136  137   Potassium 3.5 - 5.1 mmol/L 3.7  3.7  3.5   Chloride 98 - 111 mmol/L 110  106  107   CO2 22 - 32 mmol/L 24  23  23    Calcium 8.9 - 10.3 mg/dL 9.2  9.2  9.0   Total Protein 6.5 - 8.1 g/dL  7.5    Total Bilirubin 0.3 - 1.2 mg/dL  0.2    Alkaline Phos 38 - 126 U/L  45    AST 15 - 41 U/L  16    ALT 0 - 44 U/L  13      Lab Results  Component Value Date   TIBC 323 06/12/2023   TIBC 365 12/12/2022   TIBC 349 06/08/2022   FERRITIN 18 06/12/2023   FERRITIN 11 12/12/2022   FERRITIN 28 06/08/2022   IRONPCTSAT 30 06/12/2023   IRONPCTSAT 16 12/12/2022   IRONPCTSAT 16 06/08/2022     STUDIES:  No results found.    HISTORY:   Past Medical History:  Diagnosis Date   Allergy    Anemia    Anxiety    Asthma    HPV (human papilloma virus) infection    Migraine    Obese    Panic attack    PTSD (post-traumatic stress disorder) 2016    Past Surgical History:  Procedure Laterality Date   APPENDECTOMY     LEEP  12/2020   CIN II, negative margins   Plantar warts     WISDOM TOOTH EXTRACTION      Family History  Problem Relation Age of Onset   Alcohol abuse Mother    Drug abuse Mother    Diabetes Mother    Depression Mother    Mental retardation Mother    Bipolar disorder Mother    Heart disease Father    Diabetes Father    Heart failure Sister    Stroke Maternal Grandfather    Diabetes Maternal Grandfather     Social History:  reports that Jasmine Gordon has never smoked. Jasmine Gordon has never been exposed  to tobacco smoke. Jasmine Gordon has never used smokeless tobacco. Jasmine Gordon reports that Jasmine Gordon does not currently use alcohol. Jasmine Gordon reports that Jasmine Gordon does not use drugs.  Allergies:  Allergies  Allergen  Reactions   Reglan [Metoclopramide] Hives and Shortness Of Breath   Contrave [Naltrexone-Bupropion Hcl Er] Other (See Comments)    Had some reaction to the medication    Current Medications: Current Outpatient Medications  Medication Sig Dispense Refill   albuterol (VENTOLIN HFA) 108 (90 Base) MCG/ACT inhaler Inhale 2 puffs into the lungs every 6 (six) hours as needed for wheezing or shortness of breath. 8 g 3   cetirizine (ZYRTEC) 10 MG tablet Take 10 mg by mouth daily as needed for allergies.     chlorhexidine (PERIDEX) 0.12 % solution Use as directed 10 mLs in the mouth or throat.     fluticasone (FLONASE) 50 MCG/ACT nasal spray Place 1 spray into both nostrils daily. Use for 4-6 weeks then stop and use seasonally or as needed. 16 g 0   hydrOXYzine (ATARAX) 10 MG tablet Take 1 tablet (10 mg total) by mouth 3 (three) times daily as needed for anxiety. 60 tablet 3   montelukast (SINGULAIR) 10 MG tablet Take 10 mg by mouth at bedtime as needed.     Multiple Vitamins-Minerals (ONE-A-DAY WOMENS PO) Take 1 tablet by mouth daily.     Vitamin D, Ergocalciferol, (DRISDOL) 1.25 MG (50000 UNIT) CAPS capsule Take 1 capsule (50,000 Units total) by mouth every 7 (seven) days. 12 capsule 1   albuterol (PROVENTIL) (2.5 MG/3ML) 0.083% nebulizer solution Take 3 mLs (2.5 mg total) by nebulization every 4 (four) hours as needed for wheezing or shortness of breath. (Patient not taking: Reported on 05/24/2023) 75 mL 2   escitalopram (LEXAPRO) 10 MG tablet Take 1 tablet (10 mg total) by mouth daily. (Patient not taking: Reported on 06/14/2023) 30 tablet 0   Naltrexone-buPROPion HCl ER (CONTRAVE) 8-90 MG TB12 Start 1 tablet every morning for 7 days, then 1 tablet twice daily for 7 days, then 2 tablets every morning and one every evening (Patient not taking: Reported on 05/24/2023) 30 tablet 0   Spacer/Aero-Holding Chambers (AEROCHAMBER MV) inhaler Use as instructed (Patient not taking: Reported on 05/24/2023) 1 each 2    No current facility-administered medications for this visit.

## 2023-06-14 NOTE — Assessment & Plan Note (Signed)
Labs are reviewed and discussed with patient. Lab Results  Component Value Date   HGB 11.5 (L) 06/12/2023   TIBC 323 06/12/2023   IRONPCTSAT 30 06/12/2023   FERRITIN 18 06/12/2023    Stable hemoglobin of 11.5, ferritin and iron saturation are stable. Labs are reviewed in details with patient. Shared decision was made not to proceed with IV Venofer treatment at this point due to stable counts.  Recommend patient to take multivitamin

## 2023-06-16 ENCOUNTER — Encounter: Payer: Self-pay | Admitting: Family Medicine

## 2023-06-20 ENCOUNTER — Ambulatory Visit: Payer: Medicaid Other | Admitting: Internal Medicine

## 2023-06-22 ENCOUNTER — Ambulatory Visit: Payer: Medicaid Other | Admitting: Internal Medicine

## 2023-06-22 ENCOUNTER — Encounter: Payer: Self-pay | Admitting: Internal Medicine

## 2023-06-22 VITALS — BP 124/74 | HR 101 | Temp 97.7°F | Ht 64.0 in | Wt 264.4 lb

## 2023-06-22 DIAGNOSIS — G4733 Obstructive sleep apnea (adult) (pediatric): Secondary | ICD-10-CM | POA: Diagnosis not present

## 2023-06-22 NOTE — Patient Instructions (Signed)
Recommend retrying CPAP  Decreased AutoCPAP pressure 3-8  Recommend weight loss

## 2023-06-22 NOTE — Progress Notes (Signed)
Name: Jasmine Gordon MRN: [id number] DOB: Apr 15, 1986     CHIEF COMPLAINT:  Follow-up assessment for OSA   HISTORY OF PRESENT ILLNESS: Home sleep test March 2024 shows AHI of 02 June 2023 CPAP download Patient started on auto CPAP therapy 5-10 Patient is having hard time adjusting to CPAP therapy He seems the pressure is too much Will adjust pressures lowered to 3-8  Approximate weight 264 pounds Tired throughout the day Wants to take frequent naps Non-smoker Does not drink alcohol Works with adults with intellectual disabilities  I have discussed patient about inspire device however patient does not want to be assessed for that at this time  I have strongly recommended her retrying her CPAP therapy and will adjust pressures accordingly  Weight loss strongly encouraged Recommend PCP assessment for GLP-1 medications  PAST MEDICAL HISTORY :   has a past medical history of Allergy, Anemia, Anxiety, Asthma, HPV (human papilloma virus) infection, Migraine, Obese, Panic attack, and PTSD (post-traumatic stress disorder) (2016).  has a past surgical history that includes Appendectomy; Plantar warts; LEEP (12/2020); and Wisdom tooth extraction. Prior to Admission medications   Medication Sig Start Date End Date Taking? Authorizing Provider  albuterol (VENTOLIN HFA) 108 (90 Base) MCG/ACT inhaler Inhale 2 puffs into the lungs every 6 (six) hours as needed for wheezing or shortness of breath. 09/07/22  Yes Karamalegos, Netta Neat, DO  cetirizine (ZYRTEC) 10 MG tablet Take 10 mg by mouth daily as needed for allergies.   Yes [provider]  chlorhexidine (PERIDEX) 0.12 % solution Use as directed 10 mLs in the mouth or throat. 01/20/22  Yes [provider]  dicyclomine (BENTYL) 10 MG capsule Take 1 capsule (10 mg total) by mouth 4 (four) times daily -  before meals and at bedtime. 09/09/22  Yes Delton Prairie, MD  escitalopram (LEXAPRO) 10 MG tablet 5 mg daily for  one week, then 10 mg daily 09/07/22  Yes Hisada, Reina, MD  fluticasone (FLONASE) 50 MCG/ACT nasal spray Place 1 spray into both nostrils daily. Use for 4-6 weeks then stop and use seasonally or as needed. 03/27/21  Yes Lorre Munroe, NP  hydrOXYzine (ATARAX) 10 MG tablet Take 1 tablet (10 mg total) by mouth 3 (three) times daily as needed for anxiety. 02/08/22  Yes Karamalegos, Netta Neat, DO  ibuprofen (ADVIL) 800 MG tablet Take 1 tablet (800 mg total) by mouth every 8 (eight) hours as needed. 10/07/22  Yes Tommi Rumps, PA-C  methocarbamol (ROBAXIN) 500 MG tablet Take 1-2 tablets every 6 hours prn muscle spasms 10/07/22  Yes Bridget Hartshorn L, PA-C  montelukast (SINGULAIR) 10 MG tablet Take 10 mg by mouth at bedtime as needed.   Yes [provider]  Multiple Vitamins-Minerals (ONE-A-DAY WOMENS PO) Take 1 tablet by mouth daily.   Yes [provider]  OVER THE COUNTER MEDICATION Goli ashwaganda gummy 1/2 daily   Yes [provider]  pantoprazole (PROTONIX) 40 MG tablet Take 1 tablet (40 mg total) by mouth daily. 05/18/22  Yes Furth, Cadence H, PA-C  Spacer/Aero-Holding Chambers (AEROCHAMBER MV) inhaler Use as instructed 03/14/22  Yes Karamalegos, Netta Neat, DO  Vitamin D, Ergocalciferol, (DRISDOL) 1.25 MG (50000 UNIT) CAPS capsule Take 1 capsule (50,000 Units total) by mouth every 7 (seven) days. 07/27/21  Yes Karamalegos, Netta Neat, DO  metoprolol tartrate (LOPRESSOR) 25 MG tablet Take 0.5 tablets (12.5 mg total) by mouth 2 (two) times daily. 05/18/22 08/16/22  Furth, Cadence H, PA-C   Allergies  Allergen Reactions   Reglan [Metoclopramide] Hives and Shortness Of Breath   Contrave [Naltrexone-Bupropion Hcl Er] Other (See Comments)    Had some reaction to the medication    FAMILY HISTORY:  family history includes Alcohol abuse in her mother; Bipolar disorder in her mother; Depression in her mother; Diabetes in her father, maternal grandfather, and mother; Drug  abuse in her mother; Heart disease in her father; Heart failure in her sister; Mental retardation in her mother; Stroke in her maternal grandfather. SOCIAL HISTORY:  reports that she has never smoked. She has never been exposed to tobacco smoke. She has never used smokeless tobacco. She reports that she does not currently use alcohol. She reports that she does not use drugs.  BP 124/74 (BP Location: Left Wrist, Cuff Size: Large)   Pulse (!) 101   Temp 97.7 F (36.5 C) (Temporal)   Ht 5\' 4"  (1.626 m)   Wt 264 lb 6.4 oz (119.9 kg)   LMP 05/04/2023   SpO2 100%   BMI 45.38 kg/m       Review of Systems: Gen:  Denies  fever, sweats, chills weight loss  HEENT: Denies blurred vision, double vision, ear pain, eye pain, hearing loss, nose bleeds, sore throat Cardiac:  No dizziness, chest pain or heaviness, chest tightness,edema, No JVD Resp:   No cough, -sputum production, -shortness of breath,-wheezing, -hemoptysis,  Other:  All other systems negative   Physical Examination:   General Appearance: No distress  EYES PERRLA, EOM intact.   NECK Supple, No JVD Pulmonary: normal breath sounds, No wheezing.  CardiovascularNormal S1,S2.  No m/r/g.   Abdomen: Benign, Soft, non-tender. Neurology UE/LE 5/5 strength, no focal deficits Ext pulses intact, cap refill intact ALL OTHER ROS ARE NEGATIVE   ASSESSMENT AND PLAN SYNOPSIS  37 year old pleasant African-American female seen today for signs symptoms of snoring and gasping for air in the setting of obesity findings to suggest obstructive sleep apnea with AHI of 12 with nocturnal hypoxia  Recommend retrying CPAP therapy at lower pressures 3-8 Patient is intolerant of the pressure therapy Patient declined inspire device referral at this time  Obesity -recommend significant weight loss -recommend changing diet  Deconditioned state -Recommend increased daily activity and exercise    MEDICATION ADJUSTMENTS/LABS AND TESTS  ORDERED: Retry CPAP Recommend weight loss   CURRENT MEDICATIONS REVIEWED AT LENGTH WITH PATIENT TODAY   Patient  satisfied with Plan of action and management. All questions answered  Follow up 1 month  Total Time Spent  21 mins   Wallis Bamberg Santiago Glad, M.D.  [facility]  Medical Director [facility] Medical Director [facility] Cardio-P[facility] Department

## 2023-07-03 ENCOUNTER — Other Ambulatory Visit: Payer: Medicaid Other | Admitting: Obstetrics and Gynecology

## 2023-07-03 ENCOUNTER — Encounter: Payer: Self-pay | Admitting: Obstetrics and Gynecology

## 2023-07-03 NOTE — Patient Outreach (Signed)
Medicaid Managed Care   Nurse Care Manager Note  07/03/2023 Name:  Jasmine Gordon MRN:  [id number] DOB:  16-Feb-1986  Jasmine Gordon is an 37 y.o. year old female who is a primary patient of [name], DO.  The [facility] Managed Care Coordination team was consulted for assistance with:    Chronic healthcare management needs, OSA, asthma, anxiety/depression/PTSD/PA, HLD, migraines  Jasmine Gordon was given information about Medicaid Managed Care Coordination team services today. Jasmine Gordon Patient agreed to services and verbal consent obtained.  Engaged with patient by telephone for follow up visit in response to provider referral for case management and/or care coordination services.   Assessments/Interventions:  Review of past medical history, allergies, medications, health status, including review of consultants reports, laboratory and other test data, was performed as part of comprehensive evaluation and provision of chronic care management services.  SDOH (Social Determinants of Health) assessments and interventions performed: SDOH Interventions    Flowsheet Row Patient Outreach Telephone from 07/03/2023 in [place] POPULATION HE[facility] DEPARTMENT Patient Outreach Telephone from 06/01/2023 in [place] POPULATION HEALTH DEPARTMENT Patient Outreach Telephone from 03/15/2023 in [place] POPULATION HEALTH DEPARTMENT Patient Outreach Telephone from 03/06/2023 in [place] POPULATION HEALTH DEPARTMENT Patient Outreach Telephone from 02/01/2023 in [place] POPULATION HEALTH DEPARTMENT Telephone from 12/02/2022 in [place] POPULATION HEALTH DEPARTMENT  SDOH Interventions        Food Insecurity Interventions -- -- -- Intervention Not Indicated -- --  Housing Interventions -- -- -- Intervention Not Indicated -- --  Transportation Interventions -- -- Intervention Not Indicated -- -- Intervention Not Indicated  Utilities Interventions -- -- -- --  Intervention Not Indicated --  Financial Strain Interventions Other (Comment)  [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 [employer]] -- -- -- -- --  Physical Activity Interventions -- Intervention Not Indicated -- -- -- --  Stress Interventions -- -- -- -- Other (Comment)  [enrolled in counseling] --  Health Literacy Interventions -- Intervention Not Indicated -- -- -- --     Care Plan Allergies  Allergen Reactions   Reglan [Metoclopramide] Hives and Shortness Of Breath   Contrave [Naltrexone-Bupropion Hcl Er] Other (See Comments)    Had some reaction to the medication    Medications Reviewed Today     Reviewed by [name], RN (Registered Nurse) on 07/03/23 at 1046  Med List Status: <None>   Medication Order Taking? Sig Documenting Provider Last Dose Status Informant  albuterol (PROVENTIL) (2.5 MG/3ML) 0.083% nebulizer solution [id number] No Take 3 mLs (2.5 mg total) by nebulization every 4 (four) hours as needed for wheezing or shortness of breath. Merwyn Katos, MD Taking Active   albuterol (VENTOLIN HFA) 108 (90 Base) MCG/ACT inhaler [id number] No Inhale 2 puffs into the lungs every 6 (six) hours as needed for wheezing or shortness of breath. [name], DO Taking Active   cetirizine (ZYRTEC) 10 MG tablet [id number] No Take 10 mg by mouth daily as needed for allergies. [provider] Taking Active   chlorhexidine (PERIDEX) 0.12 % solution [id number] No Use as directed 10 mLs in the mouth or throat. [provider] Taking Active   escitalopram (LEXAPRO) 10 MG tablet [id number] No Take 1 tablet (10 mg total) by mouth daily.  Patient not taking: Reported on 06/14/2023   Neysa Hotter, MD Not Taking Expired 04/08/23 2359   fluticasone (FLONASE) 50 MCG/ACT nasal spray [id number] No Place 1 spray into both nostrils  daily. Use for 4-6 weeks then stop and use seasonally or as needed. Lorre Munroe, NP Taking Active   hydrOXYzine  (ATARAX) 10 MG tablet [id number] No Take 1 tablet (10 mg total) by mouth 3 (three) times daily as needed for anxiety. Karamalegos, Netta Neat, DO Taking Active   montelukast (SINGULAIR) 10 MG tablet [id number] No Take 10 mg by mouth at bedtime as needed. [provider] Taking Active   Multiple Vitamins-Minerals (ONE-A-DAY WOMENS PO) [id number] No Take 1 tablet by mouth daily. [provider] Taking Active   Naltrexone-buPROPion HCl ER (CONTRAVE) 8-90 MG TB12 [id number] No Start 1 tablet every morning for 7 days, then 1 tablet twice daily for 7 days, then 2 tablets every morning and one every evening [name], DO Taking Active   Spacer/Aero-Holding Chambers (AEROCHAMBER MV) inhaler [id number] No Use as instructed [name], DO Taking Active   Vitamin D, Ergocalciferol, (DRISDOL) 1.25 MG (50000 UNIT) CAPS capsule [id number] No Take 1 capsule (50,000 Units total) by mouth every 7 (seven) days. [name], DO Taking Active            Patient Active Problem List   Diagnosis Date Noted   Latent tuberculosis 06/14/2023   Dysplasia of cervix, high grade CIN 2 02/23/2023   High grade squamous intraepithelial lesion (HGSIL), grade 3 CIN, on biopsy of cervix 02/23/2023   Vitamin B12 deficiency 06/09/2022   Palpitations 01/21/2022   Shortness of breath 01/21/2022   Chest pain 01/21/2022   Generalized anxiety disorder with panic attacks 09/27/2021   Environmental and seasonal allergies 11/04/2020   Genital warts 09/11/2018   Positive TB test 09/25/2017   Allergic contact dermatitis due to metals 05/17/2017   Pre-diabetes 03/06/2017   Hyperlipidemia 03/06/2017   Allergic reaction 02/08/2017   Urticaria due to food allergy 02/08/2017   Vitamin D deficiency 11/08/2016   Iron deficiency anemia due to chronic blood loss 10/03/2016   Menorrhagia with regular cycle 06/27/2016   Mild persistent asthma without complication 06/17/2016   Anxiety  and depression 06/17/2016   Morbid obesity (HCC) 06/17/2016   PTSD (post-traumatic stress disorder) 2016   Conditions to be addressed/monitored per PCP order:  Chronic healthcare management needs, OSA, asthma, anxiety/depression/PTSD/PA, HLD, migraines  Care Plan : RN Care Manager Plan of Care  Updates made by [name], RN since 07/03/2023 12:00 AM     Problem: Health Promotion or Disease Self-Management (General Plan of Care)      Long-Range Goal: Chronic Disease Management and Care Coordination Needs   Expected End Date: 10/03/2023  Priority: High  Note:   Current Barriers:  Knowledge Deficits related to plan of care for management of Asthma, anxiety/depression/PTSD, HLD  Chronic Disease Management support and education needs related to Asthma, anxiety/depression/PTSD, HLD 07/03/23:  Patient upset and teary on phone today-states sister tried to kill herself-emotional support and LCSW services offered-declined.  Patient trying CPAP several hours a day-has PULM f/u end of month.  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  HEME appt WNL-iron infusion not needed. RNCM Clinical Goal(s):  Patient will verbalize understanding of plan for management of Asthma, anxiety as evidenced by patient report verbalize basic understanding of  Asthma disease process and self health management plan as evidenced by patient report take all medications exactly as prescribed and will call provider for medication related questions as evidenced by patient report demonstrate understanding of rationale for each prescribed medication as evidenced by patient report attend all scheduled medical appointments:  as evidenced by patient report continue to work with RN Care Manager to address care management and care coordination needs related to  Asthma as evidenced by adherence to CM Team Scheduled appointments work with social worker to address  related to the management of Mental Health Concerns  related to the management  of Anxiety, Depression, and PTSD, panic attacks as evidenced by review of EMR and patient or social worker report  Interventions: Inter-disciplinary care team collaboration (see longitudinal plan of care) Evaluation of current treatment plan related to  self management and patient's adherence to plan as established by provider 06/01/23:  discussed nutrition referral 07/03/23:  emotional support given, LCSW services offered.   Hyperlipidemia Interventions:  (Status:  New goal.) Long Term Goal Medication review performed; medication list updated in electronic medical record.  Counseled on importance of regular laboratory monitoring as prescribed Assessed social determinant of health barriers    Asthma: (Status:New goal.) Long Term Goal Advised patient to track and manage Asthma triggers Advised patient to self assesses Asthma action plan zone and make appointment with provider if in the yellow zone for 48 hours without improvement Discussed the importance of adequate rest and management of fatigue with Asthma Assessed social determinant of health barriers  Collaborated with SW SW referral for anxiety/depression/panic attacks-completed  Patient Goals/Self-Care Activities: Take all medications as prescribed Attend all scheduled provider appointments Call pharmacy for medication refills 3-7 days in advance of running out of medications Perform all self care activities independently  Perform IADL's (shopping, preparing meals, housekeeping, managing finances) independently Call provider office for new concerns or questions  Work with the social worker to address care coordination needs and will continue to work with the clinical team to address health care and disease management related needs Patient to contact Pharmacy and/or Provider regarding medication Patient to follow up on sleep study-completed  Follow Up Plan:  The patient has been provided with contact information for the care  management team and has been advised to call with any health related questions or concerns.  The care management team will reach out to the patient again over the next 60 business  days.    Long-Range Goal: Establish Plan of Care for Chronic Disease Management Needs   Priority: High  Note:   Timeframe:  Long-Range Goal Priority:  High Start Date:   03/07/22                          Expected End Date:     ongoing                  Follow Up Date 08/03/23   - schedule appointment for flu shot - schedule appointment for vaccines needed due to my age or health - schedule recommended health tests (blood work, mammogram, colonoscopy, pap test) - schedule and keep appointment for annual check-up    Why is this important?   Screening tests can find diseases early when they are easier to treat.  Your doctor or nurse will talk with you about which tests are important for you.  Getting shots for common diseases like the flu and shingles will help prevent them.  07/03/23:  Recent PULM and HEME appt-has PULM appt end of month   Follow Up:  Patient agrees to Care Plan and Follow-up.  Plan: The Managed Medicaid care management team will reach out to the patient again over the next 30 business  days. and The  Patient has  been provided with contact information for the Managed Medicaid care management team and has been advised to call with any health related questions or concerns.  Date/time of next scheduled RN care management/care coordination outreach:08/03/23 at 0900

## 2023-07-03 NOTE — Patient Instructions (Signed)
Hi Jasmine Gordon am sorry you are going through so much right now-please reach out as needed.  Jasmine Gordon was given information about Medicaid Managed Care team care coordination services as a part of their Healthy [facility] benefit. Jasmine Gordon verbally consented to engagement with the [facility] Managed Care team.   If you are experiencing a medical emergency, please call 911 or report to your local emergency department or urgent care.   If you have a non-emergency medical problem during routine business hours, please contact your provider's office and ask to speak with a nurse.   For questions related to your [employer] health plan, please call: [phone] or visit the homepage here: [email or web address]  If you would like to schedule transportation through your Healthy [facility] plan, please call the following number at least 2 days in advance of your appointment: [phone]  For information about your ride after you set it up, call Ride Assist at [phone]. Use this number to activate a Will Call pickup, or if your transportation is late for a scheduled pickup. Use this number, too, if you need to make a change or cancel a previously scheduled reservation.  If you need transportation services right away, call [phone]. The after-hours call center is staffed 24 hours to handle ride assistance and urgent reservation requests (including discharges) 365 days a year. Urgent trips include sick visits, hospital discharge requests and life-sustaining treatment.  Call the [facility] Line at [phone], at any time, 24 hours a day, 7 days a week. If you are in danger or need immediate medical attention call 911.  If you would like help to quit smoking, call 1-800-QUIT-NOW (445-490-3577) OR Espaol: 1-855-Djelo-Ya (4-742-595-6387) o para ms informacin haga clic aqu or Text READY to 564-332 to register  via text  [name] following are the goals we discussed in your visit today:   Goals Addressed    Timeframe:  Long-Range Goal Priority:  High Start Date:   03/07/22                          Expected End Date:     ongoing                  Follow Up Date 08/03/23   - schedule appointment for flu shot - schedule appointment for vaccines needed due to my age or health - schedule recommended health tests (blood work, mammogram, colonoscopy, pap test) - schedule and keep appointment for annual check-up    Why is this important?   Screening tests can find diseases early when they are easier to treat.  Your doctor or nurse will talk with you about which tests are important for you.  Getting shots for common diseases like the flu and shingles will help prevent them.  07/03/23:  Recent PULM and HEME appt-has PULM appt end of month  Patient verbalizes understanding of instructions and care plan provided today and agrees to view in MyChart. Active MyChart status and patient understanding of how to access instructions and care plan via MyChart confirmed with patient.     The Managed Medicaid care management team will reach out to the patient again over the next 30 business  days.  The  Patient  has been provided with contact information for the Managed Medicaid care management team and has been advised to call with any health related questions or concerns.    Jasmine Gordon  [facility] Care Management Coordinator - Managed [place] High Risk [phone]   Following is a copy of your plan of care:  Care Plan : RN Care Manager Plan of Care  Updates made by Jasmine Chandler, RN since 07/03/2023 12:00 AM     Problem: Health Promotion or Disease Self-Management (General Plan of Care)      Long-Range Goal: Chronic Disease Management and Care Coordination Needs   Expected End Date: 10/03/2023  Priority: High  Note:   Current Barriers:  Knowledge Deficits  related to plan of care for management of Asthma, anxiety/depression/PTSD, HLD  Chronic Disease Management support and education needs related to Asthma, anxiety/depression/PTSD, HLD 07/03/23:  Patient upset and teary on phone today-states sister tried to kill herself-emotional support and LCSW services offered-declined.  Patient trying CPAP several hours a day-has PULM f/u end of month.  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  HEME appt WNL-iron infusion not needed. RNCM Clinical Goal(s):  Patient will verbalize understanding of plan for management of Asthma, anxiety as evidenced by patient report verbalize basic understanding of  Asthma disease process and self health management plan as evidenced by patient report take all medications exactly as prescribed and will call provider for medication related questions as evidenced by patient report demonstrate understanding of rationale for each prescribed medication as evidenced by patient report attend all scheduled medical appointments: as evidenced by patient report continue to work with RN Care Manager to address care management and care coordination needs related to  Asthma as evidenced by adherence to CM Team Scheduled appointments work with social worker to address  related to the management of Mental Health Concerns  related to the management of Anxiety, Depression, and PTSD, panic attacks as evidenced by review of EMR and patient or social worker report  Interventions: Inter-disciplinary care team collaboration (see longitudinal plan of care) Evaluation of current treatment plan related to  self management and patient's adherence to plan as established by provider 06/01/23:  discussed nutrition referral 07/03/23:  emotional support given, LCSW services offered.   Hyperlipidemia Interventions:  (Status:  New goal.) Long Term Goal Medication review performed; medication list updated in electronic medical record.  Counseled on importance of regular  laboratory monitoring as prescribed Assessed social determinant of health barriers    Asthma: (Status:New goal.) Long Term Goal Advised patient to track and manage Asthma triggers Advised patient to self assesses Asthma action plan zone and make appointment with provider if in the yellow zone for 48 hours without improvement Discussed the importance of adequate rest and management of fatigue with Asthma Assessed social determinant of health barriers  Collaborated with SW SW referral for anxiety/depression/panic attacks-completed  Patient Goals/Self-Care Activities: Take all medications as prescribed Attend all scheduled provider appointments Call pharmacy for medication refills 3-7 days in advance of running out of medications Perform all self care activities independently  Perform IADL's (shopping, preparing meals, housekeeping, managing finances) independently Call provider office for new concerns or questions  Work with the social worker to address care coordination needs and will continue to work with the clinical team to address health care and disease management related needs Patient to contact Pharmacy and/or Provider regarding medication Patient to follow up on sleep study-completed  Follow Up Plan:  The patient has been provided with contact information for the care management team and has been advised to call with any health related questions or concerns.  The care management team will reach out to  the patient again over the next 60 business  days.

## 2023-07-10 ENCOUNTER — Telehealth: Payer: Self-pay | Admitting: Family Medicine

## 2023-07-10 ENCOUNTER — Ambulatory Visit: Payer: Medicaid Other

## 2023-07-10 DIAGNOSIS — Z23 Encounter for immunization: Secondary | ICD-10-CM | POA: Diagnosis not present

## 2023-07-10 NOTE — Telephone Encounter (Signed)
Apt scheduled for today at 1:45 for her first Hep b vaccine.    Thanks,   -Vernona Rieger

## 2023-07-10 NOTE — Telephone Encounter (Signed)
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 Please advise  CB- [phone]

## 2023-07-10 NOTE — Telephone Encounter (Signed)
Pt called back to cancel Hep B vaccine. Her employer pays for Vaccine through [facility].

## 2023-07-14 DIAGNOSIS — R112 Nausea with vomiting, unspecified: Secondary | ICD-10-CM | POA: Diagnosis not present

## 2023-07-14 DIAGNOSIS — R1031 Right lower quadrant pain: Secondary | ICD-10-CM | POA: Diagnosis not present

## 2023-07-14 DIAGNOSIS — R42 Dizziness and giddiness: Secondary | ICD-10-CM | POA: Diagnosis not present

## 2023-07-14 DIAGNOSIS — R1032 Left lower quadrant pain: Secondary | ICD-10-CM | POA: Diagnosis not present

## 2023-07-14 DIAGNOSIS — Z03818 Encounter for observation for suspected exposure to other biological agents ruled out: Secondary | ICD-10-CM | POA: Diagnosis not present

## 2023-07-14 DIAGNOSIS — R000 Tachycardia, unspecified: Secondary | ICD-10-CM | POA: Diagnosis not present

## 2023-07-21 ENCOUNTER — Ambulatory Visit: Payer: Medicaid Other | Admitting: Internal Medicine

## 2023-07-21 NOTE — Progress Notes (Deleted)
Name: Jasmine Gordon MRN: [id number] DOB: 1986-10-02     CHIEF COMPLAINT:  Follow-up assessment for OSA    HISTORY OF PRESENT ILLNESS: Home sleep test March 2024 shows AHI of 12  I have discussed patient about inspire device however patient does not want to be assessed for that at this time  July 2024 CPAP download Patient started on auto CPAP therapy 5-10 Patient is having hard time adjusting to CPAP therapy He seems the pressure is too much Will adjust pressures lowered to 3-8 Currently  No exacerbation at this time No evidence of heart failure at this time No evidence or signs of infection at this time No respiratory distress No fevers, chills, nausea, vomiting, diarrhea No evidence of lower extremity edema No evidence hemoptysis  Other pertinent history Approximate weight 264 pounds Tired throughout the day Wants to take frequent naps Non-smoker Does not drink alcohol Works with adults with intellectual disabilities   Weight loss strongly encouraged Recommend PCP assessment for GLP-1 medications  PAST MEDICAL HISTORY :   has a past medical history of Allergy, Anemia, Anxiety, Asthma, HPV (human papilloma virus) infection, Migraine, Obese, Panic attack, and PTSD (post-traumatic stress disorder) (2016).  has a past surgical history that includes Appendectomy; Plantar warts; LEEP (12/2020); and Wisdom tooth extraction. Prior to Admission medications   Medication Sig Start Date End Date Taking? Authorizing Provider  albuterol (VENTOLIN HFA) 108 (90 Base) MCG/ACT inhaler Inhale 2 puffs into the lungs every 6 (six) hours as needed for wheezing or shortness of breath. 09/07/22  Yes Karamalegos, Netta Neat, DO  cetirizine (ZYRTEC) 10 MG tablet Take 10 mg by mouth daily as needed for allergies.   Yes [provider]  chlorhexidine (PERIDEX) 0.12 % solution Use as directed 10 mLs in the mouth or throat. 01/20/22  Yes [provider]  dicyclomine  (BENTYL) 10 MG capsule Take 1 capsule (10 mg total) by mouth 4 (four) times daily -  before meals and at bedtime. 09/09/22  Yes Delton Prairie, MD  escitalopram (LEXAPRO) 10 MG tablet 5 mg daily for one week, then 10 mg daily 09/07/22  Yes Hisada, Reina, MD  fluticasone (FLONASE) 50 MCG/ACT nasal spray Place 1 spray into both nostrils daily. Use for 4-6 weeks then stop and use seasonally or as needed. 03/27/21  Yes Lorre Munroe, NP  hydrOXYzine (ATARAX) 10 MG tablet Take 1 tablet (10 mg total) by mouth 3 (three) times daily as needed for anxiety. 02/08/22  Yes Karamalegos, Netta Neat, DO  ibuprofen (ADVIL) 800 MG tablet Take 1 tablet (800 mg total) by mouth every 8 (eight) hours as needed. 10/07/22  Yes Tommi Rumps, PA-C  methocarbamol (ROBAXIN) 500 MG tablet Take 1-2 tablets every 6 hours prn muscle spasms 10/07/22  Yes Bridget Hartshorn L, PA-C  montelukast (SINGULAIR) 10 MG tablet Take 10 mg by mouth at bedtime as needed.   Yes [provider]  Multiple Vitamins-Minerals (ONE-A-DAY WOMENS PO) Take 1 tablet by mouth daily.   Yes [provider]  OVER THE COUNTER MEDICATION Goli ashwaganda gummy 1/2 daily   Yes [provider]  pantoprazole (PROTONIX) 40 MG tablet Take 1 tablet (40 mg total) by mouth daily. 05/18/22  Yes Furth, Cadence H, PA-C  Spacer/Aero-Holding Chambers (AEROCHAMBER MV) inhaler Use as instructed 03/14/22  Yes Karamalegos, Netta Neat, DO  Vitamin D, Ergocalciferol, (DRISDOL) 1.25 MG (50000 UNIT) CAPS capsule Take 1 capsule (50,000 Units total) by mouth every 7 (seven) days. 07/27/21  Yes Karamalegos,  Netta Neat, DO  metoprolol tartrate (LOPRESSOR) 25 MG tablet Take 0.5 tablets (12.5 mg total) by mouth 2 (two) times daily. 05/18/22 08/16/22  Furth, Cadence H, PA-C   Allergies  Allergen Reactions   Reglan [Metoclopramide] Hives and Shortness Of Breath   Contrave [Naltrexone-Bupropion Hcl Er] Other (See Comments)    Had some reaction to the medication     FAMILY HISTORY:  family history includes Alcohol abuse in her mother; Bipolar disorder in her mother; Depression in her mother; Diabetes in her father, maternal grandfather, and mother; Drug abuse in her mother; Heart disease in her father; Heart failure in her sister; Mental retardation in her mother; Stroke in her maternal grandfather. SOCIAL HISTORY:  reports that she has never smoked. She has never been exposed to tobacco smoke. She has never used smokeless tobacco. She reports that she does not currently use alcohol. She reports that she does not use drugs.  There were no vitals taken for this visit.       Review of Systems: Gen:  Denies  fever, sweats, chills weight loss  HEENT: Denies blurred vision, double vision, ear pain, eye pain, hearing loss, nose bleeds, sore throat Cardiac:  No dizziness, chest pain or heaviness, chest tightness,edema, No JVD Resp:   No cough, -sputum production, -shortness of breath,-wheezing, -hemoptysis,  Other:  All other systems negative   Physical Examination:   General Appearance: No distress  EYES PERRLA, EOM intact.   NECK Supple, No JVD Pulmonary: normal breath sounds, No wheezing.  CardiovascularNormal S1,S2.  No m/r/g.   Abdomen: Benign, Soft, non-tender. Neurology UE/LE 5/5 strength, no focal deficits Ext pulses intact, cap refill intact ALL OTHER ROS ARE NEGATIVE     ASSESSMENT AND PLAN SYNOPSIS  37 year old pleasant African-American female seen today for follow-up assessment for snoring gasping for air in setting of obesity which suggests sleep apnea with AHI of 12 with nocturnal hypoxia Patient was intolerant of CPAP therapy however I lowered her pressures at previous office visit  Obesity -recommend significant weight loss -recommend changing diet  Deconditioned state -Recommend increased daily activity and exercise   MEDICATION ADJUSTMENTS/LABS AND TESTS ORDERED:  Recommend weight loss   CURRENT MEDICATIONS  REVIEWED AT LENGTH WITH PATIENT TODAY   Patient  satisfied with Plan of action and management. All questions answered  Follow-up in 6 months  Total Time Spent  23 mins   Annabell Oconnor Santiago Glad, M.D.  [facility]  Medical Director [facility] Medical Director [facility] Cardio-Pulmonary Department

## 2023-07-28 ENCOUNTER — Encounter: Payer: Self-pay | Admitting: Family Medicine

## 2023-07-28 DIAGNOSIS — J4530 Mild persistent asthma, uncomplicated: Secondary | ICD-10-CM

## 2023-07-28 MED ORDER — ALBUTEROL SULFATE HFA 108 (90 BASE) MCG/ACT IN AERS: 2.0000 | INHALATION_SPRAY | Freq: Four times a day (QID) | RESPIRATORY_TRACT | 3 refills | Status: DC | PRN

## 2023-08-03 ENCOUNTER — Other Ambulatory Visit: Payer: Medicaid Other | Admitting: Obstetrics and Gynecology

## 2023-08-03 ENCOUNTER — Encounter: Payer: Self-pay | Admitting: Obstetrics and Gynecology

## 2023-08-03 NOTE — Patient Instructions (Signed)
Hi [name], I hope your classes go well-thank you for updating me-have a great day!  [name] was given information about Medicaid Managed Care team care coordination services as a part of their Healthy [facility] benefit. Jasmine Gordon verbally consented to engagement with the [facility] Managed Care team.   If you are experiencing a medical emergency, please call 911 or report to your local emergency department or urgent care.   If you have a non-emergency medical problem during routine business hours, please contact your provider's office and ask to speak with a nurse.   For questions related to your Healthy [facility] health plan, please call: [phone] or visit the homepage here: [email or web address]  If you would like to schedule transportation through your Healthy [facility] plan, please call the following number at least 2 days in advance of your appointment: [phone]  For information about your ride after you set it up, call Ride Assist at [phone]. Use this number to activate a Will Call pickup, or if your transportation is late for a scheduled pickup. Use this number, too, if you need to make a change or cancel a previously scheduled reservation.  If you need transportation services right away, call [phone]. The after-hours call center is staffed 24 hours to handle ride assistance and urgent reservation requests (including discharges) 365 days a year. Urgent trips include sick visits, hospital discharge requests and life-sustaining treatment.  Call the [facility] Line at [phone], at any time, 24 hours a day, 7 days a week. If you are in danger or need immediate medical attention call 911.  If you would like help to quit smoking, call 1-800-QUIT-NOW (802-784-3647) OR Espaol: 1-855-Djelo-Ya (0-109-323-5573) o para ms informacin haga clic aqu or Text READY to 220-254 to register via  text  [name] following are the goals we discussed in your visit today:   Goals Addressed    Timeframe:  Long-Range Goal Priority:  High Start Date:   03/07/22                          Expected End Date:     ongoing                  Follow Up Date 09/04/23   - schedule appointment for flu shot - schedule appointment for vaccines needed due to my age or health - schedule recommended health tests (blood work, mammogram, colonoscopy, pap test) - schedule and keep appointment for annual check-up    Why is this important?   Screening tests can find diseases early when they are easier to treat.  Your doctor or nurse will talk with you about which tests are important for you.  Getting shots for common diseases like the flu and shingles will help prevent them.  08/03/23:  Dental appt in 2 weeks.  Patient verbalizes understanding of instructions and care plan provided today and agrees to view in MyChart. Active MyChart status and patient understanding of how to access instructions and care plan via MyChart confirmed with patient.     The Managed Medicaid care management team will reach out to the patient again over the next 30 business  days.  The  Patient has been provided with contact information for the Managed Medicaid care management team and has been advised to call with any health related questions or concerns.   [name] RN, BSN [employer]  [employer]  Care Management Coordinator - Managed Medicaid High Risk [phone]   Following is a copy of your plan of care:  Care Plan : RN Care Manager Plan of Care  Updates made by [name], RN since 08/03/2023 12:00 AM     Problem: Health Promotion or Disease Self-Management (General Plan of Care)      Long-Range Goal: Chronic Disease Management and Care Coordination Needs   Expected End Date: 10/03/2023  Priority: High  Note:   Current Barriers:  Knowledge Deficits related to plan of care for  management of Asthma, anxiety/depression/PTSD, HLD  Chronic Disease Management support and education needs related to Asthma, anxiety/depression/PTSD, HLD 08/03/23:  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  Needs new therapist as previous one has left the practice-LCSW referral placed.  Upcoming dental appt.  Wearing CPAP as tolerated.  Sister is doing better.  Breathing and migraines okay.  RNCM Clinical Goal(s):  Patient will verbalize understanding of plan for management of Asthma, anxiety as evidenced by patient report verbalize basic understanding of  Asthma disease process and self health management plan as evidenced by patient report take all medications exactly as prescribed and will call provider for medication related questions as evidenced by patient report demonstrate understanding of rationale for each prescribed medication as evidenced by patient report attend all scheduled medical appointments: as evidenced by patient report continue to work with RN Care Manager to address care management and care coordination needs related to  Asthma as evidenced by adherence to CM Team Scheduled appointments work with social worker to address  related to the management of Mental Health Concerns  related to the management of Anxiety, Depression, and PTSD, panic attacks as evidenced by review of EMR and patient or social worker report  Interventions: Inter-disciplinary care team collaboration (see longitudinal plan of care) Evaluation of current treatment plan related to  self management and patient's adherence to plan as established by provider Collaborated with LCSW LCSW referral for new therapist as previous one left practice.  Hyperlipidemia Interventions:  (Status:  New goal.) Long Term Goal Medication review performed; medication list updated in electronic medical record.  Counseled on importance of regular laboratory monitoring as prescribed Assessed social  determinant of health barriers    Asthma: (Status:New goal.) Long Term Goal Advised patient to track and manage Asthma triggers Advised patient to self assesses Asthma action plan zone and make appointment with provider if in the yellow zone for 48 hours without improvement Discussed the importance of adequate rest and management of fatigue with Asthma Assessed social determinant of health barriers   Patient Goals/Self-Care Activities: Take all medications as prescribed Attend all scheduled provider appointments Call pharmacy for medication refills 3-7 days in advance of running out of medications Perform all self care activities independently  Perform IADL's (shopping, preparing meals, housekeeping, managing finances) independently Call provider office for new concerns or questions  Work with the social worker to address care coordination needs and will continue to work with the clinical team to address health care and disease management related needs  Follow Up Plan:  The patient has been provided with contact information for the care management team and has been advised to call with any health related questions or concerns.  The care management team will reach out to the patient again over the next 30 business  days.

## 2023-08-03 NOTE — Patient Outreach (Signed)
Medicaid Managed Care   Nurse Care Manager Note  08/03/2023 Name:  Jasmine Gordon MRN:  [id number] DOB:  07-01-1986  Jasmine Gordon is an 37 y.o. year old female who is a primary patient of [name], DO.  The [facility] Managed Care Coordination team was consulted for assistance with:    Chronic healthcare management needs, OSA, asthma, anxiety/depression/PTSD/PA, HLD, migraines  Jasmine Gordon was given information about Medicaid Managed Care Coordination team services today. Jasmine Gordon Patient agreed to services and verbal consent obtained.  Engaged with patient by telephone for follow up visit in response to provider referral for case management and/or care coordination services.   Assessments/Interventions:  Review of past medical history, allergies, medications, health status, including review of consultants reports, laboratory and other test data, was performed as part of comprehensive evaluation and provision of chronic care management services.  SDOH (Social Determinants of Health) assessments and interventions performed: SDOH Interventions    Flowsheet Row Patient Outreach Telephone from 08/03/2023 in [place] POPULATION HEALTH DEPARTMENT Patient Outreach Telephone from 07/03/2023 in [place] POPULATION HEALTH DEPARTMENT Patient Outreach Telephone from 06/01/2023 in [place] POPULATION HEALTH DEPARTMENT Patient Outreach Telephone from 03/15/2023 in [place] POPULATION HEALTH DEPARTMENT Patient Outreach Telephone from 03/06/2023 in [place] POPULATION HEALTH DEPARTMENT Patient Outreach Telephone from 02/01/2023 in  POPULATION HEALTH DEPARTMENT  SDOH Interventions        Food Insecurity Interventions -- -- -- -- Intervention Not Indicated --  Housing Interventions -- -- -- -- Intervention Not Indicated --  Transportation Interventions -- -- -- Intervention Not Indicated -- --  Utilities Interventions Intervention Not Indicated --  -- -- -- Intervention Not Indicated  Financial Strain Interventions -- Other (Comment)  [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 [employer]] -- -- -- --  Physical Activity Interventions -- -- Intervention Not Indicated -- -- --  Stress Interventions Other (Comment)  [enrolled in services] -- -- -- -- Other (Comment)  [enrolled in counseling]  Health Literacy Interventions -- -- Intervention Not Indicated -- -- --     Care Plan Allergies  Allergen Reactions   Reglan [Metoclopramide] Hives and Shortness Of Breath   Contrave [Naltrexone-Bupropion Hcl Er] Other (See Comments)    Had some reaction to the medication    Medications Reviewed Today     Reviewed by [name], RN (Registered Nurse) on 08/03/23 at [phone]  Med List Status: <None>   Medication Order Taking? Sig Documenting Provider Last Dose Status Informant  albuterol (PROVENTIL) (2.5 MG/3ML) 0.083% nebulizer solution [id number] No Take 3 mLs (2.5 mg total) by nebulization every 4 (four) hours as needed for wheezing or shortness of breath. Merwyn Katos, MD Taking Active   albuterol (VENTOLIN HFA) 108 (90 Base) MCG/ACT inhaler [id number]  Inhale 2 puffs into the lungs every 6 (six) hours as needed for wheezing or shortness of breath. Karamalegos, Netta Neat, DO  Active   cetirizine (ZYRTEC) 10 MG tablet [id number] No Take 10 mg by mouth daily as needed for allergies. [provider] Taking Active   chlorhexidine (PERIDEX) 0.12 % solution [id number] No Use as directed 10 mLs in the mouth or throat. [provider] Taking Active   escitalopram (LEXAPRO) 10 MG tablet [id number] No Take 1 tablet (10 mg total) by mouth daily.  Patient not taking: Reported on 06/14/2023   Neysa Hotter, MD Not Taking Expired 04/08/23 2359   fluticasone (FLONASE) 50 MCG/ACT nasal spray [id number]  No Place 1 spray into both nostrils daily. Use for 4-6 weeks then stop and use seasonally or as needed. Lorre Munroe,  NP Taking Active   hydrOXYzine (ATARAX) 10 MG tablet [id number] No Take 1 tablet (10 mg total) by mouth 3 (three) times daily as needed for anxiety. Karamalegos, Netta Neat, DO Taking Active   montelukast (SINGULAIR) 10 MG tablet [id number] No Take 10 mg by mouth at bedtime as needed. [provider] Taking Active   Multiple Vitamins-Minerals (ONE-A-DAY WOMENS PO) [id number] No Take 1 tablet by mouth daily. [provider] Taking Active   Naltrexone-buPROPion HCl ER (CONTRAVE) 8-90 MG TB12 [id number] No Start 1 tablet every morning for 7 days, then 1 tablet twice daily for 7 days, then 2 tablets every morning and one every evening [name], DO Taking Active   Spacer/Aero-Holding Chambers (AEROCHAMBER MV) inhaler [id number] No Use as instructed [name], DO Taking Active   Vitamin D, Ergocalciferol, (DRISDOL) 1.25 MG (50000 UNIT) CAPS capsule [id number] No Take 1 capsule (50,000 Units total) by mouth every 7 (seven) days. [name], DO Taking Active            Patient Active Problem List   Diagnosis Date Noted   Latent tuberculosis 06/14/2023   Dysplasia of cervix, high grade CIN 2 02/23/2023   High grade squamous intraepithelial lesion (HGSIL), grade 3 CIN, on biopsy of cervix 02/23/2023   Vitamin B12 deficiency 06/09/2022   Palpitations 01/21/2022   Shortness of breath 01/21/2022   Chest pain 01/21/2022   Generalized anxiety disorder with panic attacks 09/27/2021   Environmental and seasonal allergies 11/04/2020   Genital warts 09/11/2018   Positive TB test 09/25/2017   Allergic contact dermatitis due to metals 05/17/2017   Pre-diabetes 03/06/2017   Hyperlipidemia 03/06/2017   Allergic reaction 02/08/2017   Urticaria due to food allergy 02/08/2017   Vitamin D deficiency 11/08/2016   Iron deficiency anemia due to chronic blood loss 10/03/2016   Menorrhagia with regular cycle 06/27/2016   Mild persistent asthma without  complication 06/17/2016   Anxiety and depression 06/17/2016   Morbid obesity (HCC) 06/17/2016   PTSD (post-traumatic stress disorder) 2016   Conditions to be addressed/monitored per PCP order:  Chronic healthcare management needs, OSA, asthma, anxiety/depression/PTSD/PA, HLD, migraines  Care Plan : RN Care Manager Plan of Care  Updates made by [name], RN since 08/03/2023 12:00 AM     Problem: Health Promotion or Disease Self-Management (General Plan of Care)      Long-Range Goal: Chronic Disease Management and Care Coordination Needs   Expected End Date: 10/03/2023  Priority: High  Note:   Current Barriers:  Knowledge Deficits related to plan of care for management of Asthma, anxiety/depression/PTSD, HLD  Chronic Disease Management support and education needs related to Asthma, anxiety/depression/PTSD, HLD 08/03/23:  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  Needs new therapist as previous one has left the practice-LCSW referral placed.  Upcoming dental appt.  Wearing CPAP as tolerated.  Sister is doing better.  Breathing and migraines okay.  RNCM Clinical Goal(s):  Patient will verbalize understanding of plan for management of Asthma, anxiety as evidenced by patient report verbalize basic understanding of  Asthma disease process and self health management plan as evidenced by patient report take all medications exactly as prescribed and will call provider for medication related questions as evidenced by patient report demonstrate understanding of rationale for each prescribed medication as evidenced by  patient report attend all scheduled medical appointments: as evidenced by patient report continue to work with RN Care Manager to address care management and care coordination needs related to  Asthma as evidenced by adherence to CM Team Scheduled appointments work with social worker to address  related to the management of Mental Health Concerns   related to the management of Anxiety, Depression, and PTSD, panic attacks as evidenced by review of EMR and patient or social worker report  Interventions: Inter-disciplinary care team collaboration (see longitudinal plan of care) Evaluation of current treatment plan related to  self management and patient's adherence to plan as established by provider Collaborated with LCSW LCSW referral for new therapist as previous one left practice.  Hyperlipidemia Interventions:  (Status:  New goal.) Long Term Goal Medication review performed; medication list updated in electronic medical record.  Counseled on importance of regular laboratory monitoring as prescribed Assessed social determinant of health barriers    Asthma: (Status:New goal.) Long Term Goal Advised patient to track and manage Asthma triggers Advised patient to self assesses Asthma action plan zone and make appointment with provider if in the yellow zone for 48 hours without improvement Discussed the importance of adequate rest and management of fatigue with Asthma Assessed social determinant of health barriers   Patient Goals/Self-Care Activities: Take all medications as prescribed Attend all scheduled provider appointments Call pharmacy for medication refills 3-7 days in advance of running out of medications Perform all self care activities independently  Perform IADL's (shopping, preparing meals, housekeeping, managing finances) independently Call provider office for new concerns or questions  Work with the social worker to address care coordination needs and will continue to work with the clinical team to address health care and disease management related needs  Follow Up Plan:  The patient has been provided with contact information for the care management team and has been advised to call with any health related questions or concerns.  The care management team will reach out to the patient again over the next 30 business  days.     Long-Range Goal: Establish Plan of Care for Chronic Disease Management Needs   Priority: High  Note:   Timeframe:  Long-Range Goal Priority:  High Start Date:   03/07/22                          Expected End Date:     ongoing                  Follow Up Date 09/04/23   - schedule appointment for flu shot - schedule appointment for vaccines needed due to my age or health - schedule recommended health tests (blood work, mammogram, colonoscopy, pap test) - schedule and keep appointment for annual check-up    Why is this important?   Screening tests can find diseases early when they are easier to treat.  Your doctor or nurse will talk with you about which tests are important for you.  Getting shots for common diseases like the flu and shingles will help prevent them.  08/03/23:  Dental appt in 2 weeks.   Follow Up:  Patient agrees to Care Plan and Follow-up.  Plan: The Managed Medicaid care management team will reach out to the patient again over the next 30 business  days. and The  Patient has been provided with contact information for the Managed Medicaid care management team and has been advised to call with any health related  questions or concerns.  Date/time of next scheduled RN care management/care coordination outreach: 09/04/23 at 0900.

## 2023-08-09 ENCOUNTER — Other Ambulatory Visit: Payer: Medicaid Other | Admitting: Licensed Clinical Social Worker

## 2023-08-09 NOTE — Patient Outreach (Signed)
Care Coordination  08/09/2023  Jasmine Gordon 29-Jan-1986 [id number]  [facility] LCSW made successful outreach to patient for initial social work appointment but patient reports unfortunately needing to reschedule as she is still at work and is in the middle of a house inspection. She was able to reschedule this appointment with [facility] LCSW for next week on 08/15/23 at 11 am. Patient denies being in a current crisis and was agreeable to call this [facility] LCSW directly if any urgent needs arise before next week's appointment.   Dickie La, BSW, MSW, [employer] Managed Medicaid LCSW [facility]  [facility] [email or web address] Phone: [phone]

## 2023-08-15 ENCOUNTER — Other Ambulatory Visit: Payer: Medicaid Other | Admitting: Licensed Clinical Social Worker

## 2023-08-15 DIAGNOSIS — F32A Depression, unspecified: Secondary | ICD-10-CM

## 2023-08-15 DIAGNOSIS — F4310 Post-traumatic stress disorder, unspecified: Secondary | ICD-10-CM

## 2023-08-15 DIAGNOSIS — F410 Panic disorder [episodic paroxysmal anxiety] without agoraphobia: Secondary | ICD-10-CM

## 2023-08-15 NOTE — Patient Outreach (Signed)
Medicaid Managed Care Social Work Note  08/15/2023 Name:  Jasmine Gordon MRN:  [id number] DOB:  [birth year]  Jasmine Gordon is an 37 y.o. year old female who is a primary patient of [name], DO.  The Medicaid Managed Care Coordination team was consulted for assistance with:  Mental Health Counseling and Resources  Jasmine Gordon was given information about Medicaid Managed Care Coordination team services today. Jasmine Gordon Patient agreed to services and verbal consent obtained.  Engaged with patient  for by telephone forfollow up visit in response to referral for case management and/or care coordination services.   Assessments/Interventions:  Review of past medical history, allergies, medications, health status, including review of consultants reports, laboratory and other test data, was performed as part of comprehensive evaluation and provision of chronic care management services.  SDOH: (Social Determinant of Health) assessments and interventions performed: SDOH Interventions    Flowsheet Row Patient Outreach Telephone from 08/15/2023 in [place] POPULATION HEALTH DEPARTMENT Patient Outreach Telephone from 08/03/2023 in [place] POPULATION HEALTH DEPARTMENT Patient Outreach Telephone from 07/03/2023 in [place] POPULATION HEALTH DEPARTMENT Patient Outreach Telephone from 06/01/2023 in [place] POPULATION HEALTH DEPARTMENT Patient Outreach Telephone from 03/15/2023 in [place] POPULATION HEALTH DEPARTMENT Patient Outreach Telephone from 03/06/2023 in [place] POPULATION HEALTH DEPARTMENT  SDOH Interventions        Food Insecurity Interventions -- -- -- -- -- Intervention Not Indicated  Housing Interventions -- -- -- -- -- Intervention Not Indicated  Transportation Interventions -- -- -- -- Intervention Not Indicated --  Utilities Interventions -- Intervention Not Indicated -- -- -- --  Financial Strain Interventions -- -- Other (Comment)   [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  Care Plan                 Allergies  Allergen Reactions   Reglan [Metoclopramide] Hives and Shortness Of Breath   Contrave [Naltrexone-Bupropion Hcl Er] Other (See Comments)    Had some reaction to the medication    Medications Reviewed Today   Medications were not reviewed in this encounter     Patient Active Problem List   Diagnosis Date Noted   Latent tuberculosis 06/14/2023   Dysplasia of cervix, high grade CIN 2 02/23/2023   High grade squamous intraepithelial lesion (HGSIL), grade 3 CIN, on biopsy of cervix 02/23/2023   Vitamin B12 deficiency 06/09/2022   Palpitations 01/21/2022   Shortness of breath 01/21/2022   Chest pain 01/21/2022   Generalized anxiety disorder with panic attacks 09/27/2021   Environmental and seasonal allergies 11/04/2020   Genital warts 09/11/2018   Positive TB test 09/25/2017   Allergic contact dermatitis due to metals 05/17/2017   Pre-diabetes 03/06/2017   Hyperlipidemia 03/06/2017   Allergic reaction 02/08/2017   Urticaria due to food allergy 02/08/2017   Vitamin D deficiency 11/08/2016   Iron deficiency anemia due to chronic blood loss 10/03/2016   Menorrhagia with regular cycle 06/27/2016   Mild persistent asthma without complication 06/17/2016   Anxiety and depression 06/17/2016  Morbid obesity (HCC) 06/17/2016   PTSD (post-traumatic stress disorder) 2016    Conditions to be addressed/monitored per PCP order:  Anxiety  Care Plan : LCSW Plan  of Care  Updates made by Gustavus Bryant, LCSW since 08/15/2023 12:00 AM     Problem: Anxiety Identification (Anxiety)      Long-Range Goal: To find a long term counselor in order to learn how to alleviate my anxiety and stress   Start Date: 02/14/2022  Priority: High  Note:   Priority: High   Timeframe:  Long-Range Goal Priority:  High Start Date:   02/14/22               Expected End Date:  ongoing   Follow Up Date--08/24/23 at 11 am.   - check out counseling - keep 90 percent of counseling appointments - schedule counseling appointment    Why is this important?             Beating depression may take some time.            If you don't feel better right away, don't give up on your treatment plan.     Current barriers:   Chronic Mental Health needs related to anxiety, panic and stress management  Mental Health Concerns  and Social Isolation Needs Support, Education, and Care Coordination in order to meet unmet mental health needs.   Clinical Goal(s): verbalize understanding of plan for management of Anxiety, Panic and Stress     Clinical Interventions:  Assessed patient's previous and current treatment, coping skills, support system and barriers to care. Patient and spouse provided hx  Verbalization of feelings encouraged, motivational interviewing employed Emotional support provided, positive coping strategies explored Self care/establishing healthy boundaries emphasized Patient reports that her anxiety has increased which has affected her ability to carry out work and function daily.  Patient receives strong support from spouse Patient reports that she experiences both anxiety and panic attacks. She shares that she felt alone most days until she joined an anxiety management support group on Facebook. She was receptive to anxiety and depression management coping skill education. LCSW provided education on relaxation techniques such as meditation, deep breathing, massage,  grounding exercsies or yoga that can activate the body's relaxation response and ease symptoms of stress and anxiety. LCSW ask that when pt is struggling with difficult emotions and racing thoughts that they start this relaxation response process. LCSW provided extensive education on healthy coping skills for anxiety. SW used active and reflective listening, validated patient's feelings/concerns, and provided emotional support.   Motivational Interviewing employed Depression screen reviewed  PHQ2/ PHQ9 completed Mindfulness or Relaxation training provided Active listening / Reflection utilized  Emotional Support Provided Problem Solving /Task Center strategies reviewed Provided psychoeducation for mental health needs  Provided brief CBT  Reviewed mental health medications and discussed importance of compliance:  Quality of sleep assessed & Sleep Hygiene techniques promoted  Participation in counseling encouraged  Verbalization of feelings encouraged  Suicidal Ideation/Homicidal Ideation assessed: Patient denies SI/HI  Update- Patient is receiving new weekly therapy (Every Monday) with a counselor at [facility] and [employer]. Patient is interested in re-engaging with [facility] for psychiatry but would prefer a different provider. New referral placed on 08/15/23 for this. Email sent to patient with resource information as well.  Review resources, discussed options and provided patient information about  Mental Health Resources Inter-disciplinary care team collaboration (see longitudinal plan of care) [facility] LCSW emailed patient a list of coping  skills for anxiety, depression and stress which include the following: When your car dies or a deadline looms, how do you respond? Long-term, low-grade or acute stress takes a serious toll on your body and mind, so don't ignore feelings of constant tension. Stress is a natural part of life. However, too much stress can harm our health, especially if it  continues every day. This is chronic stress and can put you at risk for heart problems like heart disease and depression. Understand what's happening inside your body and learn simple coping skills to combat the negative impacts of everyday stressors.   Types of Stress There are two types of stress: Emotional - types of emotional stress are relationship problems, pressure at work, financial worries, experiencing discrimination or having a major life change. Physical - Examples of physical stress include being sick having pain, not sleeping well, recovery from an injury or having an alcohol and drug use disorder. Fight or Flight Sudden or ongoing stress activates your nervous system and floods your bloodstream with adrenaline and cortisol, two hormones that raise blood pressure, increase heart rate and spike blood sugar. These changes pitch your body into a fight or flight response. That enabled our ancestors to outrun saber-toothed tigers, and it's helpful today for situations like dodging a car accident. But most modern chronic stressors, such as finances or a challenging relationship, keep your body in that heightened state, which hurts your health. Effects of Too Much Stress If constantly under stress, most of [place] will eventually start to function less well.  Multiple studies link chronic stress to a higher risk of heart disease, stroke, depression, weight gain, memory loss and even premature death, so it's important to recognize the warning signals. Talk to your doctor about ways to manage stress if you're experiencing any of these symptoms: Prolonged periods of poor sleep. Regular, severe headaches. Unexplained weight loss or gain. Feelings of isolation, withdrawal or worthlessness. Constant anger and irritability. Loss of interest in activities. Constant worrying or obsessive thinking. Excessive alcohol or drug use. Inability to concentrate.   10 Ways to Cope with Chronic Stress It's key  to recognize stressful situations as they occur because it allows you to focus on managing how you react. We all need to know when to close our eyes and take a deep breath when we feel tension rising. Use these tips to prevent or reduce chronic stress. 1. Rebalance Work and Home All work and no play? If you're spending too much time at the office, intentionally put more dates in your calendar to enjoy time for fun, either alone or with others. 2. Get Regular Exercise Moving your body on a regular basis balances the nervous system and increases blood circulation, helping to flush out stress hormones. Even a daily 20-minute walk makes a difference. Any kind of exercise can lower stress and improve your mood ? just pick activities that you enjoy and make it a regular habit. 3. Eat Well and Limit Alcohol and Stimulants Alcohol, nicotine and caffeine may temporarily relieve stress but have negative health impacts and can make stress worse in the long run. Well-nourished bodies cope better, so start with a good breakfast, add more organic fruits and vegetables for a well-balanced diet, avoid processed foods and sugar, try herbal tea and drink more water. 4. Connect with Supportive People Talking face to face with another person releases hormones that reduce stress. Lean on those good listeners in your life. 5. Carve Out Hobby Time Do you enjoy  gardening, reading, listening to music or some other creative pursuit? Engage in activities that bring you pleasure and joy; research shows that reduces stress by almost half and lowers your heart rate, too. 6. Practice Meditation, Stress Reduction or Yoga Relaxation techniques activate a state of restfulness that counterbalances your body's fight-or-flight hormones. Even if this also means a 10-minute break in a long day: listen to music, read, go for a walk in nature, do a hobby, take a bath or spend time with a friend. Also consider taking a mindfulness-based stress  reduction course to learn effective, lasting tools or try a daily deep breathing or imagery practice. Deep Breathing Slow, calm and deep breathing can help you relax. Try these steps to focus on your breathing and repeat as needed. Find a comfortable position and close your eyes. Exhale and drop your shoulders. Breathe in through your nose; fill your lungs and then your belly. Think of relaxing your body, quieting your mind and becoming calm and peaceful. Breathe out slowly through your nose, relaxing your belly. Think of releasing tension, pain, worries or distress. Repeat steps three and four until you feel relaxed. Imagery This involves using your mind to excite the senses -- sound, vision, smell, taste and feeling. This may help ease your stress. Begin by getting comfortable and then do some slow breathing. Imagine a place you love being at. It could be somewhere from your childhood, somewhere you vacationed or just a place in your imagination. Feel how it is to be in the place you're imagining. Pay attention to the sounds, air, colors, and who is there with you. This is a place where you feel cared for and loved. All is well. You are safe. Take in all the smells, sounds, tastes and feelings. As you do, feel your body being nourished and healed. Feel the calm that surrounds you. Breathe in all the good. Breathe out any discomfort or tension. 7. Sleep Enough If you get less than seven to eight hours of sleep, your body won't tolerate stress as well as it could. If stress keeps you up at night, address the cause and add extra meditation into your day to make up for the lost z's. Try to get seven to nine hours of sleep each night. Make a regular bedtime schedule. Keep your room dark and cool. Try to avoid computers, TV, cell phones and tablets before bed. 8. Bond with Connections You Enjoy Go out for a coffee with a friend, chat with a neighbor, call a family member, visit with a clergy member,  or even hang out with your pet. Clinical studies show that spending even a short time with a companion animal can cut anxiety levels almost in half. 9. Take a Vacation Getting away from it all can reset your stress tolerance by increasing your mental and emotional outlook, which makes you a happier, more productive person upon return. Leave your cellphone and laptop at home! 10. See a Counselor, Coach or Therapist If negative thoughts overwhelm your ability to make positive changes, it's time to seek professional help. Make an appointment today--your health and life are worth it.   Patient Goals/Self-Care Activities: Over the next 120 days Attend scheduled medical appointments Utilize healthy coping skills and supportive resources discussed Contact PCP with any questions or concerns   Follow up goal       Follow up:  Patient agrees to Care Plan and Follow-up.  Plan: The Managed Medicaid care management team will reach out to  the patient again over the next 30 days.  Dickie La, BSW, MSW, [employer] Managed Medicaid LCSW [facility]  [facility] [email or web address] Phone: [phone]

## 2023-08-15 NOTE — Patient Instructions (Signed)
Visit Information  Ms. Jaaliyah Loayza was given information about Medicaid Managed Care team care coordination services as a part of their Healthy [facility] Medicaid benefit. Elisse Dylanne Husa verbally consented to engagement with the [facility] Managed Care team.   If you are experiencing a medical emergency, please call 911 or report to your local emergency department or urgent care.   If you have a non-emergency medical problem during routine business hours, please contact your provider's office and ask to speak with a nurse.   For questions related to your Healthy [facility] health plan, please call: [phone] or visit the homepage here: [email or web address]  If you would like to schedule transportation through your Healthy [facility] plan, please call the following number at least 2 days in advance of your appointment: [phone]  For information about your ride after you set it up, call Ride Assist at [phone]. Use this number to activate a Will Call pickup, or if your transportation is late for a scheduled pickup. Use this number, too, if you need to make a change or cancel a previously scheduled reservation.  If you need transportation services right away, call [phone]. The after-hours call center is staffed 24 hours to handle ride assistance and urgent reservation requests (including discharges) 365 days a year. Urgent trips include sick visits, hospital discharge requests and life-sustaining treatment.  Call the [facility] Line at [phone], at any time, 24 hours a day, 7 days a week. If you are in danger or need immediate medical attention call 911.  If you would like help to quit smoking, call 1-800-QUIT-NOW ((774)235-1083) OR Espaol: 1-855-Djelo-Ya (2-951-884-1660) o para ms informacin haga clic aqu or Text READY to 630-160 to register via text  Following is a copy of your plan of care:  Care Plan : LCSW Plan of  Care  Updates made by Gustavus Bryant, LCSW since 08/15/2023 12:00 AM     Problem: Anxiety Identification (Anxiety)      Long-Range Goal: To find a long term counselor in order to learn how to alleviate my anxiety and stress   Start Date: 02/14/2022  Priority: High  Note:   Priority: High   Timeframe:  Long-Range Goal Priority:  High Start Date:   02/14/22               Expected End Date:  ongoing   Follow Up Date--08/24/23 at 11 am.   - check out counseling - keep 90 percent of counseling appointments - schedule counseling appointment    Why is this important?             Beating depression may take some time.            If you don't feel better right away, don't give up on your treatment plan.     Current barriers:   Chronic Mental Health needs related to anxiety, panic and stress management  Mental Health Concerns  and Social Isolation Needs Support, Education, and Care Coordination in order to meet unmet mental health needs.   Clinical Goal(s): verbalize understanding of plan for management of Anxiety, Panic and Stress     [facility] LCSW emailed patient a list of coping skills for anxiety, depression and stress which include the following: When your car dies or a deadline looms, how do you respond? Long-term, low-grade or acute stress takes a serious toll on your body and mind, so don't ignore feelings of constant tension. Stress is a natural part of life. However, too  much stress can harm our health, especially if it continues every day. This is chronic stress and can put you at risk for heart problems like heart disease and depression. Understand what's happening inside your body and learn simple coping skills to combat the negative impacts of everyday stressors.   Types of Stress There are two types of stress: Emotional - types of emotional stress are relationship problems, pressure at work, financial worries, experiencing discrimination or having a major life change. Physical -  Examples of physical stress include being sick having pain, not sleeping well, recovery from an injury or having an alcohol and drug use disorder. Fight or Flight Sudden or ongoing stress activates your nervous system and floods your bloodstream with adrenaline and cortisol, two hormones that raise blood pressure, increase heart rate and spike blood sugar. These changes pitch your body into a fight or flight response. That enabled our ancestors to outrun saber-toothed tigers, and it's helpful today for situations like dodging a car accident. But most modern chronic stressors, such as finances or a challenging relationship, keep your body in that heightened state, which hurts your health. Effects of Too Much Stress If constantly under stress, most of [place] will eventually start to function less well.  Multiple studies link chronic stress to a higher risk of heart disease, stroke, depression, weight gain, memory loss and even premature death, so it's important to recognize the warning signals. Talk to your doctor about ways to manage stress if you're experiencing any of these symptoms: Prolonged periods of poor sleep. Regular, severe headaches. Unexplained weight loss or gain. Feelings of isolation, withdrawal or worthlessness. Constant anger and irritability. Loss of interest in activities. Constant worrying or obsessive thinking. Excessive alcohol or drug use. Inability to concentrate.   10 Ways to Cope with Chronic Stress It's key to recognize stressful situations as they occur because it allows you to focus on managing how you react. We all need to know when to close our eyes and take a deep breath when we feel tension rising. Use these tips to prevent or reduce chronic stress. 1. Rebalance Work and Home All work and no play? If you're spending too much time at the office, intentionally put more dates in your calendar to enjoy time for fun, either alone or with others. 2. Get Regular  Exercise Moving your body on a regular basis balances the nervous system and increases blood circulation, helping to flush out stress hormones. Even a daily 20-minute walk makes a difference. Any kind of exercise can lower stress and improve your mood ? just pick activities that you enjoy and make it a regular habit. 3. Eat Well and Limit Alcohol and Stimulants Alcohol, nicotine and caffeine may temporarily relieve stress but have negative health impacts and can make stress worse in the long run. Well-nourished bodies cope better, so start with a good breakfast, add more organic fruits and vegetables for a well-balanced diet, avoid processed foods and sugar, try herbal tea and drink more water. 4. Connect with Supportive People Talking face to face with another person releases hormones that reduce stress. Lean on those good listeners in your life. 5. Carve Out Hobby Time Do you enjoy gardening, reading, listening to music or some other creative pursuit? Engage in activities that bring you pleasure and joy; research shows that reduces stress by almost half and lowers your heart rate, too. 6. Practice Meditation, Stress Reduction or Yoga Relaxation techniques activate a state of restfulness that counterbalances your body's fight-or-flight  hormones. Even if this also means a 10-minute break in a long day: listen to music, read, go for a walk in nature, do a hobby, take a bath or spend time with a friend. Also consider taking a mindfulness-based stress reduction course to learn effective, lasting tools or try a daily deep breathing or imagery practice. Deep Breathing Slow, calm and deep breathing can help you relax. Try these steps to focus on your breathing and repeat as needed. Find a comfortable position and close your eyes. Exhale and drop your shoulders. Breathe in through your nose; fill your lungs and then your belly. Think of relaxing your body, quieting your mind and becoming calm and  peaceful. Breathe out slowly through your nose, relaxing your belly. Think of releasing tension, pain, worries or distress. Repeat steps three and four until you feel relaxed. Imagery This involves using your mind to excite the senses -- sound, vision, smell, taste and feeling. This may help ease your stress. Begin by getting comfortable and then do some slow breathing. Imagine a place you love being at. It could be somewhere from your childhood, somewhere you vacationed or just a place in your imagination. Feel how it is to be in the place you're imagining. Pay attention to the sounds, air, colors, and who is there with you. This is a place where you feel cared for and loved. All is well. You are safe. Take in all the smells, sounds, tastes and feelings. As you do, feel your body being nourished and healed. Feel the calm that surrounds you. Breathe in all the good. Breathe out any discomfort or tension. 7. Sleep Enough If you get less than seven to eight hours of sleep, your body won't tolerate stress as well as it could. If stress keeps you up at night, address the cause and add extra meditation into your day to make up for the lost z's. Try to get seven to nine hours of sleep each night. Make a regular bedtime schedule. Keep your room dark and cool. Try to avoid computers, TV, cell phones and tablets before bed. 8. Bond with Connections You Enjoy Go out for a coffee with a friend, chat with a neighbor, call a family member, visit with a clergy member, or even hang out with your pet. Clinical studies show that spending even a short time with a companion animal can cut anxiety levels almost in half. 9. Take a Vacation Getting away from it all can reset your stress tolerance by increasing your mental and emotional outlook, which makes you a happier, more productive person upon return. Leave your cellphone and laptop at home! 10. See a Counselor, Coach or Therapist If negative thoughts overwhelm  your ability to make positive changes, it's time to seek professional help. Make an appointment today--your health and life are worth it.   Patient Goals/Self-Care Activities: Over the next 120 days Attend scheduled medical appointments Utilize healthy coping skills and supportive resources discussed Contact PCP with any questions or concerns   Follow up goal      24- Hour Availability:    [facility]  [address] [place], [place] [phone] Crisis [phone]   [facility] [phone]  [place] [facility]  [phone]    [facility]  [phone] (after hours)   Therapeutic Alternative/Mobile Crisis   [phone]   [place] National Suicide Hotline  414-130-0561 (TALK) OR 3643473955   Call 988 for mental  health emergencies   [facility]  740-130-8688);  [place] and [employer]  [phone]); [place], [place], [place], [place], [place], [place], [place]    [facility] for [facility] Residents For 24/7 walk-up access to mental health services for [facility] children (4+), adolescents and adults, please visit the [facility] located at [address] in [place], [place].  *[facility] also provides comprehensive outpatient behavioral health services in a variety of locations around the [place].  Connect With [place] [address] [place], [place] [address] HelpLine: 717-753-0159 or 1-(260)191-1396  Get Directions  Find Help 24/7 By Phone Call our 24-hour HelpLine at 251-242-2273 or 636-305-9251 for immediate assistance for mental health and substance abuse issues.  Walk-In Help [place]: [facility] (Ages 4 and Up) [place]: Emergency Dept., [facility] Additional Resources National Hopeline Network: 1-800-SUICIDE The  National Suicide Prevention Lifeline: 1-800-273-TALK     Dickie La, BSW, MSW, LCSW Managed Medicaid LCSW [facility]  [facility] [email or web address] Phone: [phone]

## 2023-08-24 ENCOUNTER — Other Ambulatory Visit: Payer: Medicaid Other | Admitting: Licensed Clinical Social Worker

## 2023-08-24 NOTE — Patient Instructions (Signed)
Visit Information  Jasmine Gordon was given information about Medicaid Managed Care team care coordination services as a part of their Healthy [facility] Medicaid benefit. Jasmine Gordon verbally consented to engagement with the [facility] Managed Care team.   If you are experiencing a medical emergency, please call 911 or report to your local emergency department or urgent care.   If you have a non-emergency medical problem during routine business hours, please contact your provider's office and ask to speak with a nurse.   For questions related to your Healthy [facility] health plan, please call: [phone] or visit the homepage here: [email or web address]  If you would like to schedule transportation through your Healthy [facility] plan, please call the following number at least 2 days in advance of your appointment: [phone]  For information about your ride after you set it up, call Ride Assist at [phone]. Use this number to activate a Will Call pickup, or if your transportation is late for a scheduled pickup. Use this number, too, if you need to make a change or cancel a previously scheduled reservation.  If you need transportation services right away, call [phone]. The after-hours call center is staffed 24 hours to handle ride assistance and urgent reservation requests (including discharges) 365 days a year. Urgent trips include sick visits, hospital discharge requests and life-sustaining treatment.  Call the [facility] Line at [phone], at any time, 24 hours a day, 7 days a week. If you are in danger or need immediate medical attention call 911.  If you would like help to quit smoking, call 1-800-QUIT-NOW (4175238853) OR Espaol: 1-855-Djelo-Ya (3-295-188-4166) o para ms informacin haga clic aqu or Text READY to 063-016 to register via text  Following is a copy of your plan of care:  Care Plan : LCSW Plan of  Care  Updates made by Gustavus Bryant, LCSW since 08/24/2023 12:00 AM     Problem: Anxiety Identification (Anxiety)      Long-Range Goal: To find a long term counselor in order to learn how to alleviate my anxiety and stress   Start Date: 02/14/2022  Priority: High  Note:    Timeframe:  Long-Range Goal Priority:  High Start Date:   02/14/22               Expected End Date:  ongoing   Follow Up Date--09/18/23 at 11 am.   - check out counseling - keep 90 percent of counseling appointments - schedule counseling appointment    Why is this important?             Beating depression may take some time.            If you don't feel better right away, don't give up on your treatment plan.     Current barriers:   Chronic Mental Health needs related to anxiety, panic and stress management  Mental Health Concerns  and Social Isolation Needs Support, Education, and Care Coordination in order to meet unmet mental health needs.   Clinical Goal(s): verbalize understanding of plan for management of Anxiety, Panic and Stress     Types of Stress There are two types of stress: Emotional - types of emotional stress are relationship problems, pressure at work, financial worries, experiencing discrimination or having a major life change. Physical - Examples of physical stress include being sick having pain, not sleeping well, recovery from an injury or having an alcohol and drug use disorder. Fight or Flight Sudden or ongoing  stress activates your nervous system and floods your bloodstream with adrenaline and cortisol, two hormones that raise blood pressure, increase heart rate and spike blood sugar. These changes pitch your body into a fight or flight response. That enabled our ancestors to outrun saber-toothed tigers, and it's helpful today for situations like dodging a car accident. But most modern chronic stressors, such as finances or a challenging relationship, keep your body in that heightened  state, which hurts your health. Effects of Too Much Stress If constantly under stress, most of [place] will eventually start to function less well.  Multiple studies link chronic stress to a higher risk of heart disease, stroke, depression, weight gain, memory loss and even premature death, so it's important to recognize the warning signals. Talk to your doctor about ways to manage stress if you're experiencing any of these symptoms: Prolonged periods of poor sleep. Regular, severe headaches. Unexplained weight loss or gain. Feelings of isolation, withdrawal or worthlessness. Constant anger and irritability. Loss of interest in activities. Constant worrying or obsessive thinking. Excessive alcohol or drug use. Inability to concentrate.   10 Ways to Cope with Chronic Stress It's key to recognize stressful situations as they occur because it allows you to focus on managing how you react. We all need to know when to close our eyes and take a deep breath when we feel tension rising. Use these tips to prevent or reduce chronic stress. 1. Rebalance Work and Home All work and no play? If you're spending too much time at the office, intentionally put more dates in your calendar to enjoy time for fun, either alone or with others. 2. Get Regular Exercise Moving your body on a regular basis balances the nervous system and increases blood circulation, helping to flush out stress hormones. Even a daily 20-minute walk makes a difference. Any kind of exercise can lower stress and improve your mood ? just pick activities that you enjoy and make it a regular habit. 3. Eat Well and Limit Alcohol and Stimulants Alcohol, nicotine and caffeine may temporarily relieve stress but have negative health impacts and can make stress worse in the long run. Well-nourished bodies cope better, so start with a good breakfast, add more organic fruits and vegetables for a well-balanced diet, avoid processed foods and sugar, try  herbal tea and drink more water. 4. Connect with Supportive People Talking face to face with another person releases hormones that reduce stress. Lean on those good listeners in your life. 5. Carve Out Hobby Time Do you enjoy gardening, reading, listening to music or some other creative pursuit? Engage in activities that bring you pleasure and joy; research shows that reduces stress by almost half and lowers your heart rate, too. 6. Practice Meditation, Stress Reduction or Yoga Relaxation techniques activate a state of restfulness that counterbalances your body's fight-or-flight hormones. Even if this also means a 10-minute break in a long day: listen to music, read, go for a walk in nature, do a hobby, take a bath or spend time with a friend. Also consider taking a mindfulness-based stress reduction course to learn effective, lasting tools or try a daily deep breathing or imagery practice. Deep Breathing Slow, calm and deep breathing can help you relax. Try these steps to focus on your breathing and repeat as needed. Find a comfortable position and close your eyes. Exhale and drop your shoulders. Breathe in through your nose; fill your lungs and then your belly. Think of relaxing your body, quieting your mind and  becoming calm and peaceful. Breathe out slowly through your nose, relaxing your belly. Think of releasing tension, pain, worries or distress. Repeat steps three and four until you feel relaxed. Imagery This involves using your mind to excite the senses -- sound, vision, smell, taste and feeling. This may help ease your stress. Begin by getting comfortable and then do some slow breathing. Imagine a place you love being at. It could be somewhere from your childhood, somewhere you vacationed or just a place in your imagination. Feel how it is to be in the place you're imagining. Pay attention to the sounds, air, colors, and who is there with you. This is a place where you feel cared for and  loved. All is well. You are safe. Take in all the smells, sounds, tastes and feelings. As you do, feel your body being nourished and healed. Feel the calm that surrounds you. Breathe in all the good. Breathe out any discomfort or tension. 7. Sleep Enough If you get less than seven to eight hours of sleep, your body won't tolerate stress as well as it could. If stress keeps you up at night, address the cause and add extra meditation into your day to make up for the lost z's. Try to get seven to nine hours of sleep each night. Make a regular bedtime schedule. Keep your room dark and cool. Try to avoid computers, TV, cell phones and tablets before bed. 8. Bond with Connections You Enjoy Go out for a coffee with a friend, chat with a neighbor, call a family member, visit with a clergy member, or even hang out with your pet. Clinical studies show that spending even a short time with a companion animal can cut anxiety levels almost in half. 9. Take a Vacation Getting away from it all can reset your stress tolerance by increasing your mental and emotional outlook, which makes you a happier, more productive person upon return. Leave your cellphone and laptop at home! 10. See a Counselor, Coach or Therapist If negative thoughts overwhelm your ability to make positive changes, it's time to seek professional help. Make an appointment today--your health and life are worth it.   Patient Goals/Self-Care Activities: Over the next 120 days Attend scheduled medical appointments Utilize healthy coping skills and supportive resources discussed Contact PCP with any questions or concerns   Follow up goal      24- Hour Availability:    [facility]  [address] [place], [place] [phone] Crisis [phone]   [facility] [phone]  [place] [facility]  [phone]    [facility]  [phone] (after hours)    Therapeutic Alternative/Mobile Crisis   [phone]   [place] National Suicide Hotline  [phone] Len Child[place]) [place] [address]   Call [phone] for mental health emergencies   [facility]  [phone]);  [place] and [employer]  [phone]); [place], [place], [place], [place], [place], [place], [place]    [facility] [facility] for [facility] Residents For 24/7 walk-up access to mental health services for [facility] children (4+), adolescents and adults, please visit the [facility] located at [address] in [place], [place].  *[facility] also provides comprehensive outpatient behavioral health services in a variety of locations around the [place].  Connect With [place] [address] [place], [place] [address] HelpLine: (512) 448-4300 or 1-641-157-2996  Get Directions  Find Help 24/7 By Phone Call  our 24-hour HelpLine at [phone] or ([phone]) [phone] for immediate assistance for mental health and substance abuse issues.  Walk-In Help [place]: [facility] (Ages 4 and Up) [place]: Emergency Dept., [facility] Additional Resources National Hopeline Network: 1-800-SUICIDE The National Suicide Prevention Lifeline: 1-800-273-TALK     The following coping skill education was provided for stress relief and mental health management: "When your car dies or a deadline looms, how do you respond? Long-term, low-grade or acute stress takes a serious toll on your body and mind, so don't ignore feelings of constant tension. Stress is a natural part of life. However, too much stress can harm our health, especially if it continues every day. This is chronic stress and can put you at risk for heart problems like heart disease and depression. Understand what's happening inside your body and learn simple coping skills to combat the negative impacts of everyday  stressors.  Types of Stress There are two types of stress: Emotional - types of emotional stress are relationship problems, pressure at work, financial worries, experiencing discrimination or having a major life change. Physical - Examples of physical stress include being sick having pain, not sleeping well, recovery from an injury or having an alcohol and drug use disorder. Fight or Flight Sudden or ongoing stress activates your nervous system and floods your bloodstream with adrenaline and cortisol, two hormones that raise blood pressure, increase heart rate and spike blood sugar. These changes pitch your body into a fight or flight response. That enabled our ancestors to outrun saber-toothed tigers, and it's helpful today for situations like dodging a car accident. But most modern chronic stressors, such as finances or a challenging relationship, keep your body in that heightened state, which hurts your health. Effects of Too Much Stress If constantly under stress, most of [place] will eventually start to function less well.  Multiple studies link chronic stress to a higher risk of heart disease, stroke, depression, weight gain, memory loss and even premature death, so it's important to recognize the warning signals. Talk to your doctor about ways to manage stress if you're experiencing any of these symptoms: Prolonged periods of poor sleep. Regular, severe headaches. Unexplained weight loss or gain. Feelings of isolation, withdrawal or worthlessness. Constant anger and irritability. Loss of interest in activities. Constant worrying or obsessive thinking. Excessive alcohol or drug use. Inability to concentrate.  10 Ways to Cope with Chronic Stress It's key to recognize stressful situations as they occur because it allows you to focus on managing how you react. We all need to know when to close our eyes and take a deep breath when we feel tension rising. Use these tips to prevent or reduce  chronic stress. 1. Rebalance Work and Home All work and no play? If you're spending too much time at the office, intentionally put more dates in your calendar to enjoy time for fun, either alone or with others. 2. Get Regular Exercise Moving your body on a regular basis balances the nervous system and increases blood circulation, helping to flush out stress hormones. Even a daily 20-minute walk makes a difference. Any kind of exercise can lower stress and improve your mood ? just pick activities that you enjoy and make it a regular habit. 3. Eat Well and Limit Alcohol and Stimulants Alcohol, nicotine and caffeine may temporarily relieve stress but have negative health impacts and can make stress worse in the long run. Well-nourished bodies cope better, so start with a  good breakfast, add more organic fruits and vegetables for a well-balanced diet, avoid processed foods and sugar, try herbal tea and drink more water. 4. Connect with Supportive People Talking face to face with another person releases hormones that reduce stress. Lean on those good listeners in your life. 5. Carve Out Hobby Time Do you enjoy gardening, reading, listening to music or some other creative pursuit? Engage in activities that bring you pleasure and joy; research shows that reduces stress by almost half and lowers your heart rate, too. 6. Practice Meditation, Stress Reduction or Yoga Relaxation techniques activate a state of restfulness that counterbalances your body's fight-or-flight hormones. Even if this also means a 10-minute break in a long day: listen to music, read, go for a walk in nature, do a hobby, take a bath or spend time with a friend. Also consider doing a mindfulness exercise or try a daily deep breathing or imagery practice. Deep Breathing Slow, calm and deep breathing can help you relax. Try these steps to focus on your breathing and repeat as needed. Find a comfortable position and close your eyes. Exhale  and drop your shoulders. Breathe in through your nose; fill your lungs and then your belly. Think of relaxing your body, quieting your mind and becoming calm and peaceful. Breathe out slowly through your nose, relaxing your belly. Think of releasing tension, pain, worries or distress. Repeat steps three and four until you feel relaxed. Imagery This involves using your mind to excite the senses -- sound, vision, smell, taste and feeling. This may help ease your stress. Begin by getting comfortable and then do some slow breathing. Imagine a place you love being at. It could be somewhere from your childhood, somewhere you vacationed or just a place in your imagination. Feel how it is to be in the place you're imagining. Pay attention to the sounds, air, colors, and who is there with you. This is a place where you feel cared for and loved. All is well. You are safe. Take in all the smells, sounds, tastes and feelings. As you do, feel your body being nourished and healed. Feel the calm that surrounds you. Breathe in all the good. Breathe out any discomfort or tension. 7. Sleep Enough If you get less than seven to eight hours of sleep, your body won't tolerate stress as well as it could. If stress keeps you up at night, address the cause, and add extra meditation into your day to make up for the lost z's. Try to get seven to nine hours of sleep each night. Make a regular bedtime schedule. Keep your room dark and cool. Try to avoid computers, TV, cell phones and tablets before bed. 8. Bond with Connections You Enjoy Go out for a coffee with a friend, chat with a neighbor, call a family member, visit with a clergy member, or even hang out with your pet. Clinical studies show that spending even a short time with a companion animal can cut anxiety levels almost in half. 9. Take a Vacation Getting away from it all can reset your stress tolerance by increasing your mental and emotional outlook, which makes you  a happier, more productive person upon return. Leave your cellphone and laptop at home! 10. See a Counselor, Coach or Therapist If negative thoughts overwhelm your ability to make positive changes, it's time to seek professional help. Make an appointment today--your health and life are worth it."  Dickie La, BSW, MSW, [employer] Managed Medicaid LCSW [facility]  [place] Administrator, [email or web address] Phone: [phone]

## 2023-08-24 NOTE — Patient Outreach (Signed)
Medicaid Managed Care Social Work Note  08/24/2023 Name:  Jasmine Gordon MRN:  [id number] DOB:  1985/11/26  Jasmine Gordon is an 37 y.o. year old female who is a primary Jasmine Gordon of [name], DO.  The Medicaid Managed Care Coordination team was consulted for assistance with:  Mental Health Counseling and Resources  Jasmine Gordon was given information about Medicaid Managed Care Coordination team services today. Jasmine Gordon Jasmine Gordon agreed to services and verbal consent obtained.  Engaged with Jasmine Gordon  for by telephone forfollow up visit in response to referral for case management and/or care coordination services.   Assessments/Interventions:  Review of past medical history, allergies, medications, health status, including review of consultants reports, laboratory and other test data, was performed as part of comprehensive evaluation and provision of chronic care management services.  SDOH: (Social Determinant of Health) assessments and interventions performed: SDOH Interventions    Flowsheet Row Jasmine Gordon Outreach Telephone from 08/24/2023 in [place] POPULATION HEALTH DEPARTMENT Jasmine Gordon Outreach Telephone from 08/15/2023 in [place] POPULATION HEALTH DEPARTMENT Jasmine Gordon Outreach Telephone from 08/03/2023 in [place] POPULATION HEALTH DEPARTMENT Jasmine Gordon Outreach Telephone from 07/03/2023 in [place] POPULATION HEALTH DEPARTMENT Jasmine Gordon Outreach Telephone from 06/01/2023 in [place] POPULATION HEALTH DEPARTMENT Jasmine Gordon Outreach Telephone from 03/15/2023 in [place] POPULATION HEALTH DEPARTMENT  SDOH Interventions        Transportation Interventions -- -- -- -- -- Intervention Not Indicated  Utilities Interventions -- -- Intervention Not Indicated -- -- --  Financial Strain Interventions -- -- -- Other (Comment)  [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  Care Plan                 Allergies  Allergen Reactions   Reglan [Metoclopramide] Hives and Shortness Of Breath   Contrave [Naltrexone-Bupropion Hcl Er] Other (See Comments)    Had some reaction to the medication    Medications Reviewed Today   Medications were not reviewed in this encounter     Jasmine Gordon Active Problem List   Diagnosis Date Noted   Latent tuberculosis 06/14/2023   Dysplasia of cervix, high grade CIN 2 02/23/2023   High grade squamous intraepithelial lesion (HGSIL), grade 3 CIN, on biopsy of cervix 02/23/2023   Vitamin B12 deficiency 06/09/2022   Palpitations 01/21/2022   Shortness of breath 01/21/2022   Chest pain 01/21/2022   Generalized anxiety disorder with panic attacks 09/27/2021   Environmental and seasonal allergies 11/04/2020   Genital warts 09/11/2018   Positive TB test 09/25/2017   Allergic contact dermatitis due to metals 05/17/2017   Pre-diabetes 03/06/2017   Hyperlipidemia 03/06/2017   Allergic reaction 02/08/2017   Urticaria due to food allergy 02/08/2017   Vitamin D deficiency 11/08/2016   Iron deficiency anemia due to chronic blood loss 10/03/2016   Menorrhagia with regular cycle 06/27/2016   Mild persistent asthma without complication 06/17/2016   Anxiety and depression 06/17/2016   Morbid obesity (HCC) 06/17/2016   PTSD (post-traumatic stress disorder) 2016    Conditions to be  addressed/monitored per PCP order:  Anxiety  Care Plan : LCSW Plan of Care  Updates made by Gustavus Bryant, LCSW since 08/24/2023 12:00 AM      Problem: Anxiety Identification (Anxiety)      Long-Range Goal: To find a long term counselor in order to learn how to alleviate my anxiety and stress   Start Date: 02/14/2022  Priority: High  Note:    Timeframe:  Long-Range Goal Priority:  High Start Date:   02/14/22               Expected End Date:  ongoing   Follow Up Date--09/18/23 at 11 am.   - check out counseling - keep 90 percent of counseling appointments - schedule counseling appointment    Why is this important?             Beating depression may take some time.            If you don't feel better right away, don't give up on your treatment plan.     Current barriers:   Chronic Mental Health needs related to anxiety, panic and stress management  Mental Health Concerns  and Social Isolation Needs Support, Education, and Care Coordination in order to meet unmet mental health needs.   Clinical Goal(s): verbalize understanding of plan for management of Anxiety, Panic and Stress     Clinical Interventions:  Assessed Jasmine Gordon's previous and current treatment, coping skills, support system and barriers to care. Jasmine Gordon and spouse provided hx  Verbalization of feelings encouraged, motivational interviewing employed Emotional support provided, positive coping strategies explored Self care/establishing healthy boundaries emphasized Jasmine Gordon reports that her anxiety has increased which has affected her ability to carry out work and function daily.  Jasmine Gordon receives strong support from spouse Jasmine Gordon reports that she experiences both anxiety and panic attacks. She shares that she felt alone most days until she joined an anxiety management support group on Facebook. She was receptive to anxiety and depression management coping skill education. LCSW provided education on relaxation techniques such as meditation, deep breathing, massage, grounding exercsies or yoga that can activate the body's relaxation response and ease symptoms of  stress and anxiety. LCSW ask that when pt is struggling with difficult emotions and racing thoughts that they start this relaxation response process. LCSW provided extensive education on healthy coping skills for anxiety. SW used active and reflective listening, validated Jasmine Gordon's feelings/concerns, and provided emotional support.   Motivational Interviewing employed Depression screen reviewed  PHQ2/ PHQ9 completed Mindfulness or Relaxation training provided Active listening / Reflection utilized  Emotional Support Provided Problem Solving /Task Center strategies reviewed Provided psychoeducation for mental health needs  Provided brief CBT  Reviewed mental health medications and discussed importance of compliance:  Quality of sleep assessed & Sleep Hygiene techniques promoted  Participation in counseling encouraged  Verbalization of feelings encouraged  Suicidal Ideation/Homicidal Ideation assessed: Jasmine Gordon denies SI/HI  Update- Jasmine Gordon is receiving new weekly therapy (Every Monday) with a counselor at [facility] [employer]. Jasmine Gordon is interested in re-engaging with [facility] for psychiatry but would prefer a different provider. New referral placed on 08/15/23 for this. Email sent to Jasmine Gordon with resource information as well. Update- Jasmine Gordon has contacted [facility] and not heard back regarding psychiatry referral placed on 08/15/23. [facility] LCSW and Jasmine Gordon made joint phone call to agency and left a message inquiring about referral. Jasmine Gordon continues to attend weekly therapy and is eager to start back psychiatry treatment. [facility] LCSW will follow up  in a few weeks to ensure Jasmine Gordon was successfully scheduled. Review resources, discussed options and provided Jasmine Gordon information about  Mental Health Resources Inter-disciplinary care team collaboration (see longitudinal plan of care) [facility] LCSW emailed Jasmine Gordon a list of coping skills for anxiety, depression and stress which include the following: When  your car dies or a deadline looms, how do you respond? Long-term, low-grade or acute stress takes a serious toll on your body and mind, so don't ignore feelings of constant tension. Stress is a natural part of life. However, too much stress can harm our health, especially if it continues every day. This is chronic stress and can put you at risk for heart problems like heart disease and depression. Understand what's happening inside your body and learn simple coping skills to combat the negative impacts of everyday stressors.   Types of Stress There are two types of stress: Emotional - types of emotional stress are relationship problems, pressure at work, financial worries, experiencing discrimination or having a major life change. Physical - Examples of physical stress include being sick having pain, not sleeping well, recovery from an injury or having an alcohol and drug use disorder. Fight or Flight Sudden or ongoing stress activates your nervous system and floods your bloodstream with adrenaline and cortisol, two hormones that raise blood pressure, increase heart rate and spike blood sugar. These changes pitch your body into a fight or flight response. That enabled our ancestors to outrun saber-toothed tigers, and it's helpful today for situations like dodging a car accident. But most modern chronic stressors, such as finances or a challenging relationship, keep your body in that heightened state, which hurts your health. Effects of Too Much Stress If constantly under stress, most of [place] will eventually start to function less well.  Multiple studies link chronic stress to a higher risk of heart disease, stroke, depression, weight gain, memory loss and even premature death, so it's important to recognize the warning signals. Talk to your doctor about ways to manage stress if you're experiencing any of these symptoms: Prolonged periods of poor sleep. Regular, severe headaches. Unexplained weight loss or  gain. Feelings of isolation, withdrawal or worthlessness. Constant anger and irritability. Loss of interest in activities. Constant worrying or obsessive thinking. Excessive alcohol or drug use. Inability to concentrate.   10 Ways to Cope with Chronic Stress It's key to recognize stressful situations as they occur because it allows you to focus on managing how you react. We all need to know when to close our eyes and take a deep breath when we feel tension rising. Use these tips to prevent or reduce chronic stress. 1. Rebalance Work and Home All work and no play? If you're spending too much time at the office, intentionally put more dates in your calendar to enjoy time for fun, either alone or with others. 2. Get Regular Exercise Moving your body on a regular basis balances the nervous system and increases blood circulation, helping to flush out stress hormones. Even a daily 20-minute walk makes a difference. Any kind of exercise can lower stress and improve your mood ? just pick activities that you enjoy and make it a regular habit. 3. Eat Well and Limit Alcohol and Stimulants Alcohol, nicotine and caffeine may temporarily relieve stress but have negative health impacts and can make stress worse in the long run. Well-nourished bodies cope better, so start with a good breakfast, add more organic fruits and vegetables for a well-balanced diet, avoid processed foods and sugar,  try herbal tea and drink more water. 4. Connect with Supportive People Talking face to face with another person releases hormones that reduce stress. Lean on those good listeners in your life. 5. Carve Out Hobby Time Do you enjoy gardening, reading, listening to music or some other creative pursuit? Engage in activities that bring you pleasure and joy; research shows that reduces stress by almost half and lowers your heart rate, too. 6. Practice Meditation, Stress Reduction or Yoga Relaxation techniques activate a state of  restfulness that counterbalances your body's fight-or-flight hormones. Even if this also means a 10-minute break in a long day: listen to music, read, go for a walk in nature, do a hobby, take a bath or spend time with a friend. Also consider taking a mindfulness-based stress reduction course to learn effective, lasting tools or try a daily deep breathing or imagery practice. Deep Breathing Slow, calm and deep breathing can help you relax. Try these steps to focus on your breathing and repeat as needed. Find a comfortable position and close your eyes. Exhale and drop your shoulders. Breathe in through your nose; fill your lungs and then your belly. Think of relaxing your body, quieting your mind and becoming calm and peaceful. Breathe out slowly through your nose, relaxing your belly. Think of releasing tension, pain, worries or distress. Repeat steps three and four until you feel relaxed. Imagery This involves using your mind to excite the senses -- sound, vision, smell, taste and feeling. This may help ease your stress. Begin by getting comfortable and then do some slow breathing. Imagine a place you love being at. It could be somewhere from your childhood, somewhere you vacationed or just a place in your imagination. Feel how it is to be in the place you're imagining. Pay attention to the sounds, air, colors, and who is there with you. This is a place where you feel cared for and loved. All is well. You are safe. Take in all the smells, sounds, tastes and feelings. As you do, feel your body being nourished and healed. Feel the calm that surrounds you. Breathe in all the good. Breathe out any discomfort or tension. 7. Sleep Enough If you get less than seven to eight hours of sleep, your body won't tolerate stress as well as it could. If stress keeps you up at night, address the cause and add extra meditation into your day to make up for the lost z's. Try to get seven to nine hours of sleep each  night. Make a regular bedtime schedule. Keep your room dark and cool. Try to avoid computers, TV, cell phones and tablets before bed. 8. Bond with Connections You Enjoy Go out for a coffee with a friend, chat with a neighbor, call a family member, visit with a clergy member, or even hang out with your pet. Clinical studies show that spending even a short time with a companion animal can cut anxiety levels almost in half. 9. Take a Vacation Getting away from it all can reset your stress tolerance by increasing your mental and emotional outlook, which makes you a happier, more productive person upon return. Leave your cellphone and laptop at home! 10. See a Counselor, Coach or Therapist If negative thoughts overwhelm your ability to make positive changes, it's time to seek professional help. Make an appointment today--your health and life are worth it.   Jasmine Gordon Goals/Self-Care Activities: Over the next 120 days Attend scheduled medical appointments Utilize healthy coping skills and supportive resources discussed  Contact PCP with any questions or concerns   Follow up goal       Follow up:  Jasmine Gordon agrees to Care Plan and Follow-up.  Plan: The Managed Medicaid care management team will reach out to the Jasmine Gordon again over the next 30 days.  Dickie La, BSW, MSW, [employer] Managed Medicaid LCSW [facility]  [facility] [email or web address] Phone: [phone]

## 2023-09-04 ENCOUNTER — Other Ambulatory Visit: Payer: Medicaid Other | Admitting: Obstetrics and Gynecology

## 2023-09-04 NOTE — Patient Instructions (Signed)
Hi [name], I hope you are doing okay and I am sorry I missed you today - as a part of your Medicaid benefit, you are eligible for care management and care coordination services at no cost or copay. I was unable to reach you by phone today but would be happy to help you with your health related needs. Please feel free to call me at [phone]  A member of the Managed Medicaid care management team will reach out to you again over the next 30 business  days.   [name] RN, BSN [place]  [facility] [employer] - Managed Medicaid High Risk [phone]

## 2023-09-04 NOTE — Patient Outreach (Signed)
Medicaid Managed Care   Unsuccessful Attempt Note   09/04/2023 Name: Jasmine Gordon MRN: [id number] DOB: June 02, 1986  Referred by: [name], DO Reason for referral : High Risk Managed Medicaid (No answer, unable to leave a message/)  An unsuccessful telephone outreach was attempted today. The patient was referred to the case management team for assistance with care management and care coordination.    Follow Up Plan: The Managed Medicaid care management team will reach out to the patient again over the next 30 business  days. and The  Patient has been provided with contact information for the Managed Medicaid care management team and has been advised to call with any health related questions or concerns.   [name] RN, BSN [place]  [facility] [employer] - Managed Medicaid High Risk [phone]

## 2023-09-18 ENCOUNTER — Other Ambulatory Visit: Payer: Medicaid Other | Admitting: Licensed Clinical Social Worker

## 2023-09-18 NOTE — Patient Instructions (Signed)
Jasmine Gordon ,   [facility] Managed Care Team is available to provide assistance to you with your healthcare needs at no cost and as a benefit of your [facility] plan. I'm sorry I was unable to reach you today for our scheduled appointment. Our care guide will call you to reschedule our telephone appointment. Please call me at the number below. I am available to be of assistance to you regarding your healthcare needs. .   Thank you,   Dickie La, BSW, MSW, LCSW Managed Medicaid LCSW [facility]  [address] [email or web address] Phone: [phone]

## 2023-09-18 NOTE — Patient Outreach (Signed)
Medicaid Managed Care   Unsuccessful Attempt Note   09/18/2023 Name: Jasmine Gordon MRN: [id number] DOB: 1986/01/15  Referred by: [name], DO Reason for referral : No chief complaint on file.   A second unsuccessful telephone outreach was attempted today. The patient was referred to the case management team for assistance with care management and care coordination.    Follow Up Plan: A HIPAA compliant phone message was left for the patient providing contact information and requesting a return call.   Dickie La, BSW, MSW, [employer] Managed Medicaid LCSW [facility]  [facility] [email or web address] Phone: [phone]

## 2023-09-20 DIAGNOSIS — G4733 Obstructive sleep apnea (adult) (pediatric): Secondary | ICD-10-CM | POA: Diagnosis not present

## 2023-09-25 ENCOUNTER — Other Ambulatory Visit: Payer: Medicaid Other

## 2023-09-25 ENCOUNTER — Telehealth: Payer: Self-pay

## 2023-09-25 DIAGNOSIS — E559 Vitamin D deficiency, unspecified: Secondary | ICD-10-CM

## 2023-09-25 DIAGNOSIS — R7303 Prediabetes: Secondary | ICD-10-CM | POA: Diagnosis not present

## 2023-09-25 DIAGNOSIS — Z0000 Encounter for general adult medical examination without abnormal findings: Secondary | ICD-10-CM | POA: Diagnosis not present

## 2023-09-25 DIAGNOSIS — E7800 Pure hypercholesterolemia, unspecified: Secondary | ICD-10-CM | POA: Diagnosis not present

## 2023-09-25 DIAGNOSIS — F4310 Post-traumatic stress disorder, unspecified: Secondary | ICD-10-CM

## 2023-09-25 DIAGNOSIS — D500 Iron deficiency anemia secondary to blood loss (chronic): Secondary | ICD-10-CM | POA: Diagnosis not present

## 2023-09-25 DIAGNOSIS — F411 Generalized anxiety disorder: Secondary | ICD-10-CM | POA: Diagnosis not present

## 2023-09-25 DIAGNOSIS — F410 Panic disorder [episodic paroxysmal anxiety] without agoraphobia: Secondary | ICD-10-CM

## 2023-09-25 NOTE — Telephone Encounter (Signed)
..   Medicaid Managed Care   Unsuccessful Outreach Note  09/25/2023 Name: Jasmine Gordon MRN: [id number] DOB: 1986-06-06  Referred by: [name], DO Reason for referral : Appointment (I called the patient today to get her missed phone appointment with the MM LCSW rescheduled. She did not answer but I was able to leave a message on her VM.)   A second unsuccessful telephone outreach was attempted today. The patient was referred to the case management team for assistance with care management and care coordination.   Follow Up Plan: A HIPAA compliant phone message was left for the patient providing contact information and requesting a return call.  The care management team will reach out to the patient again over the next 7 days.   Weston Settle Care Guide  [facility] Managed  Care Guide [facility]  [phone]

## 2023-09-26 ENCOUNTER — Encounter: Payer: Self-pay | Admitting: Family Medicine

## 2023-09-26 LAB — LIPID PANEL
Cholesterol: 220 mg/dL — ABNORMAL HIGH (ref <200)
HDL: 46 mg/dL — ABNORMAL LOW (ref >=50)
LDL Cholesterol (Calc): 155 mg/dL — ABNORMAL HIGH
Non-HDL Cholesterol (Calc): 174 mg/dL — ABNORMAL HIGH (ref <130)
Total CHOL/HDL Ratio: 4.8 (calc) (ref <5.0)
Triglycerides: 87 mg/dL (ref <150)

## 2023-09-26 LAB — HEMOGLOBIN A1C
Hgb A1c MFr Bld: 6 %{Hb} — ABNORMAL HIGH (ref <5.7)
Mean Plasma Glucose: 126 mg/dL
eAG (mmol/L): 7 mmol/L

## 2023-09-26 LAB — CBC WITH DIFFERENTIAL/PLATELET
Absolute Lymphocytes: 1922 {cells}/uL (ref 850–3900)
Absolute Monocytes: 282 {cells}/uL (ref 200–950)
Basophils Absolute: 9 {cells}/uL (ref 0–200)
Basophils Relative: 0.2 %
Eosinophils Absolute: 52 {cells}/uL (ref 15–500)
Eosinophils Relative: 1.1 %
HCT: 37.8 % (ref 35.0–45.0)
Hemoglobin: 11.9 g/dL (ref 11.7–15.5)
MCH: 25.9 pg — ABNORMAL LOW (ref 27.0–33.0)
MCHC: 31.5 g/dL — ABNORMAL LOW (ref 32.0–36.0)
MCV: 82.2 fL (ref 80.0–100.0)
MPV: 10.6 fL (ref 7.5–12.5)
Monocytes Relative: 6 %
Neutro Abs: 2435 {cells}/uL (ref 1500–7800)
Neutrophils Relative %: 51.8 %
Platelets: 272 10*3/uL (ref 140–400)
RBC: 4.6 10*6/uL (ref 3.80–5.10)
RDW: 13 % (ref 11.0–15.0)
Total Lymphocyte: 40.9 %
WBC: 4.7 10*3/uL (ref 3.8–10.8)

## 2023-09-26 LAB — COMPLETE METABOLIC PANEL WITH GFR
AG Ratio: 1.5 (calc) (ref 1.0–2.5)
ALT: 6 U/L (ref 6–29)
AST: 9 U/L — ABNORMAL LOW (ref 10–30)
Albumin: 4.1 g/dL (ref 3.6–5.1)
Alkaline phosphatase (APISO): 45 U/L (ref 31–125)
BUN: 12 mg/dL (ref 7–25)
CO2: 27 mmol/L (ref 20–32)
Calcium: 9.4 mg/dL (ref 8.6–10.2)
Chloride: 106 mmol/L (ref 98–110)
Creat: 0.74 mg/dL (ref 0.50–0.97)
Globulin: 2.7 g/dL (ref 1.9–3.7)
Glucose, Bld: 90 mg/dL (ref 65–99)
Potassium: 3.8 mmol/L (ref 3.5–5.3)
Sodium: 140 mmol/L (ref 135–146)
Total Bilirubin: 0.3 mg/dL (ref 0.2–1.2)
Total Protein: 6.8 g/dL (ref 6.1–8.1)
eGFR: 108 mL/min/{1.73_m2} (ref >=60)

## 2023-09-26 LAB — TSH: TSH: 2.13 m[IU]/L

## 2023-09-26 LAB — VITAMIN D 25 HYDROXY (VIT D DEFICIENCY, FRACTURES): Vit D, 25-Hydroxy: 16 ng/mL — ABNORMAL LOW (ref 30–100)

## 2023-09-27 ENCOUNTER — Other Ambulatory Visit: Payer: Self-pay | Admitting: Obstetrics and Gynecology

## 2023-09-27 NOTE — Patient Outreach (Signed)
Medicaid Managed Care   Nurse Care Manager Note  09/27/2023 Name:  Jasmine Gordon MRN:  [id number] DOB:  1986/05/20  Jasmine Gordon is an 37 y.o. year old female who is a primary patient of [name], DO.  The [facility] Managed Care Coordination team was consulted for assistance with:    Chronic healthcare management needs, OSA, asthma, anxiety/depression/PTSD/PA, HLD, migraines  Jasmine Gordon was given information about Medicaid Managed Care Coordination team services today. Jasmine Gordon Patient agreed to services and verbal consent obtained.  Engaged with patient by telephone for follow up visit in response to provider referral for case management and/or care coordination services.   Assessments/Interventions:  Review of past medical history, allergies, medications, health status, including review of consultants reports, laboratory and other test data, was performed as part of comprehensive evaluation and provision of chronic care management services.  SDOH (Social Determinants of Health) assessments and interventions performed: SDOH Interventions    Flowsheet Row Patient Outreach Telephone from 09/27/2023 in [place] POPULATION HEALTH DEPARTMENT Patient Outreach Telephone from 08/24/2023 in [place] POPULATION HEALTH DEPARTMENT Patient Outreach Telephone from 08/15/2023 in [place] POPULATION HEALTH DEPARTMENT Patient Outreach Telephone from 08/03/2023 in [place] POPULATION HEALTH DEPARTMENT Patient Outreach Telephone from 07/03/2023 in [place] POPULATION HEALTH DEPARTMENT Patient Outreach Telephone from 06/01/2023 in [place] POPULATION HEALTH DEPARTMENT  SDOH Interventions        Food Insecurity Interventions Intervention Not Indicated -- -- -- -- --  Housing Interventions Intervention Not Indicated -- -- -- -- --  Utilities Interventions -- -- -- Intervention Not Indicated -- --  Financial Strain Interventions -- -- -- -- Other (Comment)   [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 [employer]] --  Physical Activity Interventions -- -- -- -- -- Intervention Not Indicated  Stress Interventions -- [employer], Provide Counseling Offered [employer], Provide Counseling  [Actively seeing a counselor every Monday with [facility]] Other (Comment)  [enrolled in services] -- --  Health Literacy Interventions -- -- -- -- -- Intervention Not Indicated     Care Plan Allergies  Allergen Reactions   Reglan [Metoclopramide] Hives and Shortness Of Breath   Contrave [Naltrexone-Bupropion Hcl Er] Other (See Comments)    Had some reaction to the medication    Medications Reviewed Today     Reviewed by [name], RN (Registered Nurse) on 09/27/23 at 1238  Med List Status: <None>   Medication Order Taking? Sig Documenting Provider Last Dose Status Informant  albuterol (PROVENTIL) (2.5 MG/3ML) 0.083% nebulizer solution [id number] No Take 3 mLs (2.5 mg total) by nebulization every 4 (four) hours as needed for wheezing or shortness of breath. Merwyn Katos, MD Taking Active   albuterol (VENTOLIN HFA) 108 (90 Base) MCG/ACT inhaler [id number]  Inhale 2 puffs into the lungs every 6 (six) hours as needed for wheezing or shortness of breath. Karamalegos, Netta Neat, DO  Active   cetirizine (ZYRTEC) 10 MG tablet [id number] No Take 10 mg by mouth daily as needed for allergies. [provider] Taking Active   chlorhexidine (PERIDEX) 0.12 % solution [id number] No Use as directed 10 mLs in the mouth or throat. [provider] Taking Active   escitalopram (LEXAPRO) 10 MG tablet [id number] No Take 1 tablet (10 mg total) by mouth daily.  Patient not taking: Reported on 06/14/2023   Neysa Hotter, MD Not Taking Expired 04/08/23 2359   fluticasone (FLONASE) 50 MCG/ACT nasal spray  [id number] No Place 1 spray into both nostrils daily. Use for 4-6 weeks then stop and use seasonally or  as needed. Lorre Munroe, NP Taking Active   hydrOXYzine (ATARAX) 10 MG tablet [id number] No Take 1 tablet (10 mg total) by mouth 3 (three) times daily as needed for anxiety. Karamalegos, Netta Neat, DO Taking Active   montelukast (SINGULAIR) 10 MG tablet [id number] No Take 10 mg by mouth at bedtime as needed. [provider] Taking Active   Multiple Vitamins-Minerals (ONE-A-DAY WOMENS PO) [id number] No Take 1 tablet by mouth daily. [provider] Taking Active   Naltrexone-buPROPion HCl ER (CONTRAVE) 8-90 MG TB12 [id number] No Start 1 tablet every morning for 7 days, then 1 tablet twice daily for 7 days, then 2 tablets every morning and one every evening [name], DO Taking Active   Spacer/Aero-Holding Chambers (AEROCHAMBER MV) inhaler [id number] No Use as instructed [name], DO Taking Active   Vitamin D, Ergocalciferol, (DRISDOL) 1.25 MG (50000 UNIT) CAPS capsule [id number] No Take 1 capsule (50,000 Units total) by mouth every 7 (seven) days. [name], DO Taking Active            Patient Active Problem List   Diagnosis Date Noted   Latent tuberculosis 06/14/2023   Dysplasia of cervix, high grade CIN 2 02/23/2023   High grade squamous intraepithelial lesion (HGSIL), grade 3 CIN, on biopsy of cervix 02/23/2023   Vitamin B12 deficiency 06/09/2022   Palpitations 01/21/2022   Shortness of breath 01/21/2022   Chest pain 01/21/2022   Generalized anxiety disorder with panic attacks 09/27/2021   Environmental and seasonal allergies 11/04/2020   Genital warts 09/11/2018   Positive TB test 09/25/2017   Allergic contact dermatitis due to metals 05/17/2017   Pre-diabetes 03/06/2017   Hyperlipidemia 03/06/2017   Allergic reaction 02/08/2017   Urticaria due to food allergy 02/08/2017   Vitamin D deficiency 11/08/2016   Iron deficiency anemia due to chronic blood loss 10/03/2016   Menorrhagia with regular cycle 06/27/2016   Mild  persistent asthma without complication 06/17/2016   Anxiety and depression 06/17/2016   Morbid obesity (HCC) 06/17/2016   PTSD (post-traumatic stress disorder) 2016   Conditions to be addressed/monitored per PCP order:  Chronic healthcare management needs, OSA, asthma, anxiety/depression/PTSD/PA, HLD, migraines  Care Plan : RN Care Manager Plan of Care  Updates made by [name], RN since 09/27/2023 12:00 AM     Problem: Health Promotion or Disease Self-Management (General Plan of Care)      Long-Range Goal: Chronic Disease Management and Care Coordination Needs   Expected End Date: 12/28/2023  Priority: High  Note:   Current Barriers:  Knowledge Deficits related to plan of care for management of Asthma, anxiety/depression/PTSD, HLD  Chronic Disease Management support and education needs related to Asthma, anxiety/depression/PTSD, HLD 09/27/23:  Continues with classes.  Has new therapist now, sees weekly and is going well.  Currently going through a separation from husband-living in same household.  Breathing under control and recent dental visit.    RNCM Clinical Goal(s):  Patient will verbalize understanding of plan for management of Asthma, anxiety as evidenced by patient report verbalize basic understanding of  Asthma disease process and self health management plan as evidenced by patient report take all medications exactly as prescribed and will call provider for medication related questions as evidenced by patient report demonstrate understanding of rationale for each prescribed medication as evidenced by patient report attend all scheduled medical appointments: as  evidenced by patient report continue to work with RN Care Manager to address care management and care coordination needs related to  Asthma as evidenced by adherence to CM Team Scheduled appointments work with social worker to address  related to the management of Mental Health Concerns  related to the management of  Anxiety, Depression, and PTSD, panic attacks as evidenced by review of EMR and patient or social worker report  Interventions: Inter-disciplinary care team collaboration (see longitudinal plan of care) Evaluation of current treatment plan related to  self management and patient's adherence to plan as established by provider Collaborated with LCSW LCSW referral for new therapist as previous one left practice.  Hyperlipidemia Interventions:  (Status:  New goal.) Long Term Goal Medication review performed; medication list updated in electronic medical record.  Counseled on importance of regular laboratory monitoring as prescribed Assessed social determinant of health barriers    Asthma: (Status:New goal.) Long Term Goal Advised patient to track and manage Asthma triggers Advised patient to self assesses Asthma action plan zone and make appointment with provider if in the yellow zone for 48 hours without improvement Discussed the importance of adequate rest and management of fatigue with Asthma Assessed social determinant of health barriers   Patient Goals/Self-Care Activities: Take all medications as prescribed Attend all scheduled provider appointments Call pharmacy for medication refills 3-7 days in advance of running out of medications Perform all self care activities independently  Perform IADL's (shopping, preparing meals, housekeeping, managing finances) independently Call provider office for new concerns or questions  Work with the social worker to address care coordination needs and will continue to work with the clinical team to address health care and disease management related needs  Follow Up Plan:  The patient has been provided with contact information for the care management team and has been advised to call with any health related questions or concerns.  The care management team will reach out to the patient again over the next 45 business  days.    Long-Range Goal:  Establish Plan of Care for Chronic Disease Management Needs   Priority: High  Note:   Timeframe:  Long-Range Goal Priority:  High Start Date:   03/07/22                          Expected End Date:     ongoing                  Follow Up Date 11/10/23   - schedule appointment for flu shot - schedule appointment for vaccines needed due to my age or health - schedule recommended health tests (blood work, mammogram, colonoscopy, pap test) - schedule and keep appointment for annual check-up    Why is this important?   Screening tests can find diseases early when they are easier to treat.  Your doctor or nurse will talk with you about which tests are important for you.  Getting shots for common diseases like the flu and shingles will help prevent them.  09/27/23:  Upcoming appts with PCP and PULM   Follow Up:  Patient agrees to Care Plan and Follow-up.  Plan: The Managed Medicaid care management team will reach out to the patient again over the next 45 business  days. and The  Patient has been provided with contact information for the Managed Medicaid care management team and has been advised to call with any health related questions or concerns.  Date/time of next  scheduled RN care management/care coordination outreach:  11/10/23 at 1030.

## 2023-09-27 NOTE — Patient Instructions (Signed)
Hi [name], great to catch up today, have a nice afternoon and thank you for the updates.  [name] was given information about Medicaid Managed Care team care coordination services as a part of their Healthy [facility] benefit. Jasmine Gordon verbally consented to engagement with the [facility] Managed Care team.   If you are experiencing a medical emergency, please call 911 or report to your local emergency department or urgent care.   If you have a non-emergency medical problem during routine business hours, please contact your provider's office and ask to speak with a nurse.   For questions related to your Healthy [facility] health plan, please call: [phone] or visit the homepage here: [email or web address]  If you would like to schedule transportation through your Healthy [facility] plan, please call the following number at least 2 days in advance of your appointment: [phone]  For information about your ride after you set it up, call Ride Assist at [phone]. Use this number to activate a Will Call pickup, or if your transportation is late for a scheduled pickup. Use this number, too, if you need to make a change or cancel a previously scheduled reservation.  If you need transportation services right away, call [phone]. The after-hours call center is staffed 24 hours to handle ride assistance and urgent reservation requests (including discharges) 365 days a year. Urgent trips include sick visits, hospital discharge requests and life-sustaining treatment.  Call the [facility] Line at [phone], at any time, 24 hours a day, 7 days a week. If you are in danger or need immediate medical attention call 911.  If you would like help to quit smoking, call 1-800-QUIT-NOW ((930) 717-7428) OR Espaol: 1-855-Djelo-Ya (2-025-427-0623) o para ms informacin haga clic aqu or Text READY to 762-831 to register  via text  Jasmine Gordon - following are the goals we discussed in your visit today:   Goals Addressed    Timeframe:  Long-Range Goal Priority:  High Start Date:   03/07/22                          Expected End Date:     ongoing                  Follow Up Date 11/10/23   - schedule appointment for flu shot - schedule appointment for vaccines needed due to my age or health - schedule recommended health tests (blood work, mammogram, colonoscopy, pap test) - schedule and keep appointment for annual check-up    Why is this important?   Screening tests can find diseases early when they are easier to treat.  Your doctor or nurse will talk with you about which tests are important for you.  Getting shots for common diseases like the flu and shingles will help prevent them.  09/27/23:  Upcoming appts with PCP and PULM  Patient verbalizes understanding of instructions and care plan provided today and agrees to view in MyChart. Active MyChart status and patient understanding of how to access instructions and care plan via MyChart confirmed with patient.     The Managed Medicaid care management team will reach out to the patient again over the next 45 business  days.  The  Patient has been provided with contact information for the Managed Medicaid care management team and has been advised to call with any health related questions or concerns.   [name] RN, BSN [place]  [employer], production - Managed Medicaid High Risk [phone].   Following is a copy of your plan of care:  Care Plan : RN Care Manager Plan of Care  Updates made by [name], RN since 09/27/2023 12:00 AM     Problem: Health Promotion or Disease Self-Management (General Plan of Care)      Long-Range Goal: Chronic Disease Management and Care Coordination Needs   Expected End Date: 12/28/2023  Priority: High  Note:   Current Barriers:  Knowledge Deficits related to plan of  care for management of Asthma, anxiety/depression/PTSD, HLD  Chronic Disease Management support and education needs related to Asthma, anxiety/depression/PTSD, HLD 09/27/23:  Continues with classes.  Has new therapist now, sees weekly and is going well.  Currently going through a separation from husband-living in same household.  Breathing under control and recent dental visit.    RNCM Clinical Goal(s):  Patient will verbalize understanding of plan for management of Asthma, anxiety as evidenced by patient report verbalize basic understanding of  Asthma disease process and self health management plan as evidenced by patient report take all medications exactly as prescribed and will call provider for medication related questions as evidenced by patient report demonstrate understanding of rationale for each prescribed medication as evidenced by patient report attend all scheduled medical appointments: as evidenced by patient report continue to work with RN Care Manager to address care management and care coordination needs related to  Asthma as evidenced by adherence to CM Team Scheduled appointments work with social worker to address  related to the management of Mental Health Concerns  related to the management of Anxiety, Depression, and PTSD, panic attacks as evidenced by review of EMR and patient or social worker report  Interventions: Inter-disciplinary care team collaboration (see longitudinal plan of care) Evaluation of current treatment plan related to  self management and patient's adherence to plan as established by provider Collaborated with LCSW LCSW referral for new therapist as previous one left practice.  Hyperlipidemia Interventions:  (Status:  New goal.) Long Term Goal Medication review performed; medication list updated in electronic medical record.  Counseled on importance of regular laboratory monitoring as prescribed Assessed social determinant of health barriers    Asthma:  (Status:New goal.) Long Term Goal Advised patient to track and manage Asthma triggers Advised patient to self assesses Asthma action plan zone and make appointment with provider if in the yellow zone for 48 hours without improvement Discussed the importance of adequate rest and management of fatigue with Asthma Assessed social determinant of health barriers   Patient Goals/Self-Care Activities: Take all medications as prescribed Attend all scheduled provider appointments Call pharmacy for medication refills 3-7 days in advance of running out of medications Perform all self care activities independently  Perform IADL's (shopping, preparing meals, housekeeping, managing finances) independently Call provider office for new concerns or questions  Work with the social worker to address care coordination needs and will continue to work with the clinical team to address health care and disease management related needs  Follow Up Plan:  The patient has been provided with contact information for the care management team and has been advised to call with any health related questions or concerns.  The care management team will reach out to the patient again over the next 45 business  days.

## 2023-10-03 ENCOUNTER — Other Ambulatory Visit: Payer: Self-pay | Admitting: Licensed Clinical Social Worker

## 2023-10-03 DIAGNOSIS — F4310 Post-traumatic stress disorder, unspecified: Secondary | ICD-10-CM

## 2023-10-03 DIAGNOSIS — F32A Depression, unspecified: Secondary | ICD-10-CM

## 2023-10-03 NOTE — Patient Outreach (Signed)
Medicaid Managed Care Social Work Note  10/03/2023 Name:  Jasmine Gordon MRN:  [id number] DOB:  01/20/1986  Jasmine Gordon is an 37 y.o. year old female who is a primary patient of [name], DO.  The Medicaid Managed Care Coordination team was consulted for assistance with:  Mental Health Counseling and Resources  Jasmine Gordon was given information about Medicaid Managed Care Coordination team services today. Jasmine Gordon Patient agreed to services and verbal consent obtained.  Engaged with patient  for by telephone forfollow up visit in response to referral for case management and/or care coordination services.   Assessments/Interventions:  Review of past medical history, allergies, medications, health status, including review of consultants reports, laboratory and other test data, was performed as part of comprehensive evaluation and provision of chronic care management services.  SDOH: (Social Determinant of Health) assessments and interventions performed: SDOH Interventions    Flowsheet Row Patient Outreach Telephone from 10/03/2023 in [place] POPULATION HEALTH DEPARTMENT Patient Outreach Telephone from 09/27/2023 in [place] POPULATION HEALTH DEPARTMENT Patient Outreach Telephone from 08/24/2023 in [place] POPULATION HEALTH DEPARTMENT Patient Outreach Telephone from 08/15/2023 in [place] POPULATION HEALTH DEPARTMENT Patient Outreach Telephone from 08/03/2023 in [place] POPULATION HEALTH DEPARTMENT Patient Outreach Telephone from 07/03/2023 in [place] POPULATION HEALTH DEPARTMENT  SDOH Interventions        Food Insecurity Interventions -- Intervention Not Indicated -- -- -- --  Housing Interventions -- Intervention Not Indicated -- -- -- --  Utilities Interventions -- -- -- -- Intervention Not Indicated --  Financial Strain Interventions -- -- -- -- -- Other (Comment)  [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  Care Plan                 Allergies  Allergen Reactions   Reglan [Metoclopramide] Hives and Shortness Of Breath   Contrave [Naltrexone-Bupropion Hcl Er] Other (See Comments)    Had some reaction to the medication    Medications Reviewed Today   Medications were not reviewed in this encounter     Patient Active Problem List   Diagnosis Date Noted   Latent tuberculosis 06/14/2023   Dysplasia of cervix, high grade CIN 2 02/23/2023   High grade squamous intraepithelial lesion (HGSIL), grade 3 CIN, on biopsy of cervix 02/23/2023   Vitamin B12 deficiency 06/09/2022   Palpitations 01/21/2022   Shortness of breath 01/21/2022   Chest pain 01/21/2022   Generalized anxiety disorder with panic attacks 09/27/2021   Environmental and seasonal allergies 11/04/2020   Genital warts 09/11/2018   Positive TB test 09/25/2017   Allergic contact dermatitis due to metals 05/17/2017   Pre-diabetes 03/06/2017   Hyperlipidemia 03/06/2017   Allergic reaction 02/08/2017   Urticaria due to food allergy 02/08/2017   Vitamin D deficiency 11/08/2016   Iron deficiency anemia due to chronic blood loss 10/03/2016   Menorrhagia with regular cycle 06/27/2016   Mild persistent asthma without complication 06/17/2016   Anxiety and depression 06/17/2016   Morbid obesity (HCC) 06/17/2016   PTSD (post-traumatic stress disorder)  2016    Conditions to be addressed/monitored per PCP order:  Anxiety  Care Plan : LCSW Plan of Care  Updates made by Gustavus Bryant,  LCSW since 10/03/2023 12:00 AM     Problem: Anxiety Identification (Anxiety)      Long-Range Goal: To find a long term counselor in order to learn how to alleviate my anxiety and stress   Start Date: 02/14/2022  Priority: High  Note:    Timeframe:  Long-Range Goal Priority:  High Start Date:   02/14/22               Expected End Date:  ongoing   Follow Up Date--09/18/23 at 11 am.   - check out counseling - keep 90 percent of counseling appointments - schedule counseling appointment    Why is this important?             Beating depression may take some time.            If you don't feel better right away, don't give up on your treatment plan.     Current barriers:   Chronic Mental Health needs related to anxiety, panic and stress management  Mental Health Concerns  and Social Isolation Needs Support, Education, and Care Coordination in order to meet unmet mental health needs.   Clinical Goal(s): verbalize understanding of plan for management of Anxiety, Panic and Stress     Clinical Interventions:  Assessed patient's previous and current treatment, coping skills, support system and barriers to care. Patient and spouse provided hx  Verbalization of feelings encouraged, motivational interviewing employed Emotional support provided, positive coping strategies explored Self care/establishing healthy boundaries emphasized Patient reports that her anxiety has increased which has affected her ability to carry out work and function daily.  Patient receives strong support from spouse Patient reports that she experiences both anxiety and panic attacks. She shares that she felt alone most days until she joined an anxiety management support group on Facebook. She was receptive to anxiety and depression management coping skill education. LCSW provided education on relaxation techniques such as meditation, deep breathing, massage, grounding exercsies or yoga that can activate the body's  relaxation response and ease symptoms of stress and anxiety. LCSW ask that when pt is struggling with difficult emotions and racing thoughts that they start this relaxation response process. LCSW provided extensive education on healthy coping skills for anxiety. SW used active and reflective listening, validated patient's feelings/concerns, and provided emotional support.   Motivational Interviewing employed Depression screen reviewed  PHQ2/ PHQ9 completed Mindfulness or Relaxation training provided Active listening / Reflection utilized  Emotional Support Provided Problem Solving /Task Center strategies reviewed Provided psychoeducation for mental health needs  Provided brief CBT  Reviewed mental health medications and discussed importance of compliance:  Quality of sleep assessed & Sleep Hygiene techniques promoted  Participation in counseling encouraged  Verbalization of feelings encouraged  Suicidal Ideation/Homicidal Ideation assessed: Patient denies SI/HI  Update- Patient is receiving new weekly therapy (Every Monday) with a counselor at [facility] and [employer]. Patient is interested in re-engaging with [facility] for psychiatry but would prefer a different provider. New referral placed on 08/15/23 for this. Email sent to patient with resource information as well. Update- Patient has contacted [facility] and not heard back regarding psychiatry referral placed on 08/15/23. [facility] LCSW and patient made joint phone call to agency and left a message inquiring about referral. Patient continues to attend weekly therapy and is eager to start back  psychiatry treatment. [facility] LCSW will follow up in a few weeks to ensure patient was successfully scheduled. 10/03/23 update- Patient has not received a return call from [facility] and no progress has been made with psychiatry referral placed back in September of 2024. Patient is agreeable to me placing a new referral today to inquire. [facility] LCSW sent message to [facility]  staff regarding psychiatry referral needs as well. Patient is experiencing high levels of work related stress at this time and continues weekly therapy at [facility] which has been very helpful. [facility] LCSW will follow up within 30 days.  Review resources, discussed options and provided patient information about  Mental Health Resources Inter-disciplinary care team collaboration (see longitudinal plan of care) [facility] LCSW emailed patient a list of coping skills for anxiety, depression and stress which include the following: When your car dies or a deadline looms, how do you respond? Long-term, low-grade or acute stress takes a serious toll on your body and mind, so don't ignore feelings of constant tension. Stress is a natural part of life. However, too much stress can harm our health, especially if it continues every day. This is chronic stress and can put you at risk for heart problems like heart disease and depression. Understand what's happening inside your body and learn simple coping skills to combat the negative impacts of everyday stressors.   Types of Stress There are two types of stress: Emotional - types of emotional stress are relationship problems, pressure at work, financial worries, experiencing discrimination or having a major life change. Physical - Examples of physical stress include being sick having pain, not sleeping well, recovery from an injury or having an alcohol and drug use disorder. Fight or Flight Sudden or ongoing stress activates your nervous system and floods your bloodstream with adrenaline and cortisol, two hormones that raise blood pressure, increase heart rate and spike blood sugar. These changes pitch your body into a fight or flight response. That enabled our ancestors to outrun saber-toothed tigers, and it's helpful today for situations like dodging a car accident. But most modern chronic stressors, such as finances or a challenging relationship, keep your body in that  heightened state, which hurts your health. Effects of Too Much Stress If constantly under stress, most of [place] will eventually start to function less well.  Multiple studies link chronic stress to a higher risk of heart disease, stroke, depression, weight gain, memory loss and even premature death, so it's important to recognize the warning signals. Talk to your doctor about ways to manage stress if you're experiencing any of these symptoms: Prolonged periods of poor sleep. Regular, severe headaches. Unexplained weight loss or gain. Feelings of isolation, withdrawal or worthlessness. Constant anger and irritability. Loss of interest in activities. Constant worrying or obsessive thinking. Excessive alcohol or drug use. Inability to concentrate.   10 Ways to Cope with Chronic Stress It's key to recognize stressful situations as they occur because it allows you to focus on managing how you react. We all need to know when to close our eyes and take a deep breath when we feel tension rising. Use these tips to prevent or reduce chronic stress. 1. Rebalance Work and Home All work and no play? If you're spending too much time at the office, intentionally put more dates in your calendar to enjoy time for fun, either alone or with others. 2. Get Regular Exercise Moving your body on a regular basis balances the nervous system and increases blood circulation, helping to flush  out stress hormones. Even a daily 20-minute walk makes a difference. Any kind of exercise can lower stress and improve your mood ? just pick activities that you enjoy and make it a regular habit. 3. Eat Well and Limit Alcohol and Stimulants Alcohol, nicotine and caffeine may temporarily relieve stress but have negative health impacts and can make stress worse in the long run. Well-nourished bodies cope better, so start with a good breakfast, add more organic fruits and vegetables for a well-balanced diet, avoid processed foods and  sugar, try herbal tea and drink more water. 4. Connect with Supportive People Talking face to face with another person releases hormones that reduce stress. Lean on those good listeners in your life. 5. Carve Out Hobby Time Do you enjoy gardening, reading, listening to music or some other creative pursuit? Engage in activities that bring you pleasure and joy; research shows that reduces stress by almost half and lowers your heart rate, too. 6. Practice Meditation, Stress Reduction or Yoga Relaxation techniques activate a state of restfulness that counterbalances your body's fight-or-flight hormones. Even if this also means a 10-minute break in a long day: listen to music, read, go for a walk in nature, do a hobby, take a bath or spend time with a friend. Also consider taking a mindfulness-based stress reduction course to learn effective, lasting tools or try a daily deep breathing or imagery practice. Deep Breathing Slow, calm and deep breathing can help you relax. Try these steps to focus on your breathing and repeat as needed. Find a comfortable position and close your eyes. Exhale and drop your shoulders. Breathe in through your nose; fill your lungs and then your belly. Think of relaxing your body, quieting your mind and becoming calm and peaceful. Breathe out slowly through your nose, relaxing your belly. Think of releasing tension, pain, worries or distress. Repeat steps three and four until you feel relaxed. Imagery This involves using your mind to excite the senses -- sound, vision, smell, taste and feeling. This may help ease your stress. Begin by getting comfortable and then do some slow breathing. Imagine a place you love being at. It could be somewhere from your childhood, somewhere you vacationed or just a place in your imagination. Feel how it is to be in the place you're imagining. Pay attention to the sounds, air, colors, and who is there with you. This is a place where you feel  cared for and loved. All is well. You are safe. Take in all the smells, sounds, tastes and feelings. As you do, feel your body being nourished and healed. Feel the calm that surrounds you. Breathe in all the good. Breathe out any discomfort or tension. 7. Sleep Enough If you get less than seven to eight hours of sleep, your body won't tolerate stress as well as it could. If stress keeps you up at night, address the cause and add extra meditation into your day to make up for the lost z's. Try to get seven to nine hours of sleep each night. Make a regular bedtime schedule. Keep your room dark and cool. Try to avoid computers, TV, cell phones and tablets before bed. 8. Bond with Connections You Enjoy Go out for a coffee with a friend, chat with a neighbor, call a family member, visit with a clergy member, or even hang out with your pet. Clinical studies show that spending even a short time with a companion animal can cut anxiety levels almost in half. 9. Take a  Vacation Getting away from it all can reset your stress tolerance by increasing your mental and emotional outlook, which makes you a happier, more productive person upon return. Leave your cellphone and laptop at home! 10. See a Counselor, Coach or Therapist If negative thoughts overwhelm your ability to make positive changes, it's time to seek professional help. Make an appointment today--your health and life are worth it.   Patient Goals/Self-Care Activities: Over the next 120 days Attend scheduled medical appointments Utilize healthy coping skills and supportive resources discussed Contact PCP with any questions or concerns   Follow up goal       Follow up:  Patient agrees to Care Plan and Follow-up.  Plan: The Managed Medicaid care management team will reach out to the patient again over the next 30 days.  Dickie La, BSW, MSW, LCSW Licensed Clinical Social Worker [employer]   [email or web address] Direct Dial: [phone]

## 2023-10-03 NOTE — Patient Instructions (Signed)
Visit Information  Ms. Onesti Conard was given information about Medicaid Managed Care team care coordination services as a part of their Healthy [facility] Medicaid benefit. Alexica Adiela Goodluck verbally consented to engagement with the [facility] Managed Care team.   If you are experiencing a medical emergency, please call 911 or report to your local emergency department or urgent care.   If you have a non-emergency medical problem during routine business hours, please contact your provider's office and ask to speak with a nurse.   For questions related to your Healthy [facility] health plan, please call: [phone] or visit the homepage here: [email or web address]  If you would like to schedule transportation through your Healthy [facility] plan, please call the following number at least 2 days in advance of your appointment: [phone]  For information about your ride after you set it up, call Ride Assist at [phone]. Use this number to activate a Will Call pickup, or if your transportation is late for a scheduled pickup. Use this number, too, if you need to make a change or cancel a previously scheduled reservation.  If you need transportation services right away, call [phone]. The after-hours call center is staffed 24 hours to handle ride assistance and urgent reservation requests (including discharges) 365 days a year. Urgent trips include sick visits, hospital discharge requests and life-sustaining treatment.  Call the [facility] Line at [phone], at any time, 24 hours a day, 7 days a week. If you are in danger or need immediate medical attention call 911.  If you would like help to quit smoking, call 1-800-QUIT-NOW ((865)247-8651) OR Espaol: 1-855-Djelo-Ya (8-756-433-2951) o para ms informacin haga clic aqu or Text READY to 884-166 to register via text  Following is a copy of your plan of care:  Care Plan : LCSW Plan of  Care  Updates made by Gustavus Bryant, LCSW since 10/03/2023 12:00 AM     Problem: Anxiety Identification (Anxiety)      Long-Range Goal: To find a long term counselor in order to learn how to alleviate my anxiety and stress   Start Date: 02/14/2022  Priority: High  Note:    Timeframe:  Long-Range Goal Priority:  High Start Date:   02/14/22               Expected End Date:  ongoing   Follow Up Date--09/18/23 at 11 am.   - check out counseling - keep 90 percent of counseling appointments - schedule counseling appointment    Why is this important?             Beating depression may take some time.            If you don't feel better right away, don't give up on your treatment plan.     Current barriers:   Chronic Mental Health needs related to anxiety, panic and stress management  Mental Health Concerns  and Social Isolation Needs Support, Education, and Care Coordination in order to meet unmet mental health needs.   Clinical Goal(s): verbalize understanding of plan for management of Anxiety, Panic and Stress     Types of Stress There are two types of stress: Emotional - types of emotional stress are relationship problems, pressure at work, financial worries, experiencing discrimination or having a major life change. Physical - Examples of physical stress include being sick having pain, not sleeping well, recovery from an injury or having an alcohol and drug use disorder. Fight or Flight Sudden or ongoing  stress activates your nervous system and floods your bloodstream with adrenaline and cortisol, two hormones that raise blood pressure, increase heart rate and spike blood sugar. These changes pitch your body into a fight or flight response. That enabled our ancestors to outrun saber-toothed tigers, and it's helpful today for situations like dodging a car accident. But most modern chronic stressors, such as finances or a challenging relationship, keep your body in that heightened  state, which hurts your health. Effects of Too Much Stress If constantly under stress, most of [place] will eventually start to function less well.  Multiple studies link chronic stress to a higher risk of heart disease, stroke, depression, weight gain, memory loss and even premature death, so it's important to recognize the warning signals. Talk to your doctor about ways to manage stress if you're experiencing any of these symptoms: Prolonged periods of poor sleep. Regular, severe headaches. Unexplained weight loss or gain. Feelings of isolation, withdrawal or worthlessness. Constant anger and irritability. Loss of interest in activities. Constant worrying or obsessive thinking. Excessive alcohol or drug use. Inability to concentrate.   10 Ways to Cope with Chronic Stress It's key to recognize stressful situations as they occur because it allows you to focus on managing how you react. We all need to know when to close our eyes and take a deep breath when we feel tension rising. Use these tips to prevent or reduce chronic stress. 1. Rebalance Work and Home All work and no play? If you're spending too much time at the office, intentionally put more dates in your calendar to enjoy time for fun, either alone or with others. 2. Get Regular Exercise Moving your body on a regular basis balances the nervous system and increases blood circulation, helping to flush out stress hormones. Even a daily 20-minute walk makes a difference. Any kind of exercise can lower stress and improve your mood ? just pick activities that you enjoy and make it a regular habit. 3. Eat Well and Limit Alcohol and Stimulants Alcohol, nicotine and caffeine may temporarily relieve stress but have negative health impacts and can make stress worse in the long run. Well-nourished bodies cope better, so start with a good breakfast, add more organic fruits and vegetables for a well-balanced diet, avoid processed foods and sugar, try  herbal tea and drink more water. 4. Connect with Supportive People Talking face to face with another person releases hormones that reduce stress. Lean on those good listeners in your life. 5. Carve Out Hobby Time Do you enjoy gardening, reading, listening to music or some other creative pursuit? Engage in activities that bring you pleasure and joy; research shows that reduces stress by almost half and lowers your heart rate, too. 6. Practice Meditation, Stress Reduction or Yoga Relaxation techniques activate a state of restfulness that counterbalances your body's fight-or-flight hormones. Even if this also means a 10-minute break in a long day: listen to music, read, go for a walk in nature, do a hobby, take a bath or spend time with a friend. Also consider taking a mindfulness-based stress reduction course to learn effective, lasting tools or try a daily deep breathing or imagery practice. Deep Breathing Slow, calm and deep breathing can help you relax. Try these steps to focus on your breathing and repeat as needed. Find a comfortable position and close your eyes. Exhale and drop your shoulders. Breathe in through your nose; fill your lungs and then your belly. Think of relaxing your body, quieting your mind and  becoming calm and peaceful. Breathe out slowly through your nose, relaxing your belly. Think of releasing tension, pain, worries or distress. Repeat steps three and four until you feel relaxed. Imagery This involves using your mind to excite the senses -- sound, vision, smell, taste and feeling. This may help ease your stress. Begin by getting comfortable and then do some slow breathing. Imagine a place you love being at. It could be somewhere from your childhood, somewhere you vacationed or just a place in your imagination. Feel how it is to be in the place you're imagining. Pay attention to the sounds, air, colors, and who is there with you. This is a place where you feel cared for and  loved. All is well. You are safe. Take in all the smells, sounds, tastes and feelings. As you do, feel your body being nourished and healed. Feel the calm that surrounds you. Breathe in all the good. Breathe out any discomfort or tension. 7. Sleep Enough If you get less than seven to eight hours of sleep, your body won't tolerate stress as well as it could. If stress keeps you up at night, address the cause and add extra meditation into your day to make up for the lost z's. Try to get seven to nine hours of sleep each night. Make a regular bedtime schedule. Keep your room dark and cool. Try to avoid computers, TV, cell phones and tablets before bed. 8. Bond with Connections You Enjoy Go out for a coffee with a friend, chat with a neighbor, call a family member, visit with a clergy member, or even hang out with your pet. Clinical studies show that spending even a short time with a companion animal can cut anxiety levels almost in half. 9. Take a Vacation Getting away from it all can reset your stress tolerance by increasing your mental and emotional outlook, which makes you a happier, more productive person upon return. Leave your cellphone and laptop at home! 10. See a Counselor, Coach or Therapist If negative thoughts overwhelm your ability to make positive changes, it's time to seek professional help. Make an appointment today--your health and life are worth it.   Patient Goals/Self-Care Activities: Over the next 120 days Attend scheduled medical appointments Utilize healthy coping skills and supportive resources discussed Contact PCP with any questions or concerns   Follow up goal   Dickie La, BSW, MSW, LCSW Licensed Clinical Social Worker [employer]   [email or web address] Direct Dial: [phone]

## 2023-10-11 ENCOUNTER — Ambulatory Visit (INDEPENDENT_AMBULATORY_CARE_PROVIDER_SITE_OTHER): Payer: Medicaid Other | Admitting: Family Medicine

## 2023-10-11 ENCOUNTER — Encounter: Payer: Self-pay | Admitting: Family Medicine

## 2023-10-11 VITALS — BP 118/78 | HR 94 | Ht 64.0 in | Wt 261.8 lb

## 2023-10-11 DIAGNOSIS — R7303 Prediabetes: Secondary | ICD-10-CM

## 2023-10-11 DIAGNOSIS — Z6841 Body Mass Index (BMI) 40.0 and over, adult: Secondary | ICD-10-CM

## 2023-10-11 DIAGNOSIS — J4530 Mild persistent asthma, uncomplicated: Secondary | ICD-10-CM

## 2023-10-11 DIAGNOSIS — E7800 Pure hypercholesterolemia, unspecified: Secondary | ICD-10-CM | POA: Diagnosis not present

## 2023-10-11 DIAGNOSIS — Z0000 Encounter for general adult medical examination without abnormal findings: Secondary | ICD-10-CM

## 2023-10-11 MED ORDER — ALBUTEROL SULFATE HFA 108 (90 BASE) MCG/ACT IN AERS: 2.0000 | INHALATION_SPRAY | Freq: Four times a day (QID) | RESPIRATORY_TRACT | 5 refills | Status: DC | PRN

## 2023-10-11 MED ORDER — WEGOVY 0.25 MG/0.5ML ~~LOC~~ SOAJ: 0.2500 mg | SUBCUTANEOUS | 1 refills | Status: DC

## 2023-10-11 NOTE — Patient Instructions (Addendum)
Thank you for coming to the office today.  Start Wegovy 0.25mg  weekly injection for 4 weeks, then contact me before running out and we can increase to 0.5mg , there are about 6 doses in total to increase to.  Refilled Albuterol  Recent Labs    09/25/23 0833  HGBA1C 6.0*   Goal to improve the weight to fix the sugar and cholesterol.   Please schedule a Follow-up Appointment to: Return in about 3 months (around 01/11/2024) for 3 month follow-up Weight management (any day, early AM preferred).  If you have any other questions or concerns, please feel free to call the office or send a message through MyChart. You may also schedule an earlier appointment if necessary.  Additionally, you may be receiving a survey about your experience at our office within a few days to 1 week by e-mail or mail. We value your feedback.  [name], DO [facility], [place]

## 2023-10-11 NOTE — Progress Notes (Signed)
Subjective:    Patient ID: Jasmine Gordon, female    DOB: [birth year], 37 y.o.   MRN: [id number]  Jasmine Gordon is a 37 y.o. female presenting on 10/11/2023 for Annual Exam   HPI  Discussed the use of AI scribe software for clinical note transcription with the patient, who gave verbal consent to proceed.   The patient, a 37 year old with a history of asthma and weight management issues, presented for a follow-up consultation. The patient reported recent job changes leading to a more sedentary lifestyle and less healthy dietary choices, which she believes may have contributed to a slight increase in weight. She also expressed concerns about her cholesterol levels, which have been slightly elevated recently.      Morbid obesity BMI >44 Difficulty losing weight Prior trial on Contrave, did not tolerate well For past >6 months she is trying to improve lifestyle interventions to manage her weight. Improved lifestyle diet and goal to resume exercise Intermittent fasting. Not eating much bread or sugar carbs Weight stable to reduced 1-3 lbs   Vitamin D Deficiency Lab 16 On supplement. Intermittently   Anxiety / Depression Has [facility] Dr Vanetta Shawl, and therapist. Off SSRI medication   Elevated A1c PreDM A1c at 6.0, prior 5.7 to 6.0 range Fam history with parents have Diabetes Concerned about her risk of progression to diabetes  HYPERLIPIDEMIA: - Reports concerns with some abnormal diet. Last lipid panel 09/2023, LDL 155   Asthma Needs Albuterol inhaler refilled   Health Maintenance: UTD Vaccines     03/07/2023   10:08 AM 09/16/2022    1:12 PM 07/12/2022   11:56 AM  Depression screen PHQ 2/9  Decreased Interest 1 1   Down, Depressed, Hopeless 0 1   PHQ - 2 Score 1 2   Altered sleeping 1 1   Tired, decreased energy 0 1   Change in appetite 0 1   Feeling bad or failure about yourself  1 2   Trouble concentrating 1 0   Moving slowly or  fidgety/restless 0 0   Suicidal thoughts 0 0   PHQ-9 Score 4 7   Difficult doing work/chores Somewhat difficult Somewhat difficult      Information is confidential and restricted. Go to Review Flowsheets to unlock data.       03/07/2023   10:09 AM 09/16/2022    1:12 PM 07/12/2022   11:57 AM 03/25/2022    9:48 AM  GAD 7 : Generalized Anxiety Score  Nervous, Anxious, on Edge 1 2    Control/stop worrying 1 2    Worry too much - different things 1 2    Trouble relaxing 1 2    Restless 1 0    Easily annoyed or irritable 1 1    Afraid - awful might happen 1 3    Total GAD 7 Score 7 12    Anxiety Difficulty Somewhat difficult Somewhat difficult       Information is confidential and restricted. Go to Review Flowsheets to unlock data.     Past Medical History:  Diagnosis Date   Allergy    Anemia    Anxiety    Asthma    HPV (human papilloma virus) infection    Migraine    Obese    Panic attack    PTSD (post-traumatic stress disorder) 2016   Past Surgical History:  Procedure Laterality Date   APPENDECTOMY     LEEP  12/2020   CIN II, negative margins  Plantar warts     WISDOM TOOTH EXTRACTION     Social History   Socioeconomic History   Marital status: Married    Spouse name: Not on file   Number of children: Not on file   Years of education: Not on file   Highest education level: Some college, no degree  Occupational History   Not on file  Tobacco Use   Smoking status: Never    Passive exposure: Never   Smokeless tobacco: Never  Vaping Use   Vaping status: Never Used  Substance and Sexual Activity   Alcohol use: Not Currently   Drug use: No   Sexual activity: Yes    Birth control/protection: Condom, None  Other Topics Concern   Not on file  Social History Narrative   Not on file   Social Determinants of Health   Financial Resource Strain: Low Risk  (10/11/2023)   Overall Financial Resource Strain (CARDIA)    Difficulty of Paying Living Expenses: Not  very hard  Food Insecurity: No Food Insecurity (10/11/2023)   Hunger Vital Sign    Worried About Running Out of Food in the Last Year: Never true    Ran Out of Food in the Last Year: Never true  Transportation Needs: No Transportation Needs (10/11/2023)   PRAPARE - Administrator, Civil Service (Medical): No    Lack of Transportation (Non-Medical): No  Physical Activity: Insufficiently Active (10/11/2023)   Exercise Vital Sign    Days of Exercise per Week: 4 days    Minutes of Exercise per Session: 20 min  Stress: Stress Concern Present (10/11/2023)   [employer] of Occupational Health - Occupational Stress Questionnaire    Feeling of Stress : To some extent  Social Connections: Moderately Integrated (10/11/2023)   Social Connection and Isolation Panel [NHANES]    Frequency of Communication with Friends and Family: More than three times a week    Frequency of Social Gatherings with Friends and Family: Never    Attends Religious Services: More than 4 times per year    Active Member of [employer] or Organizations: No    Attends Banker Meetings: Never    Marital Status: Married  Catering manager Violence: Not At Risk (03/15/2023)   Humiliation, Afraid, Rape, and Kick questionnaire    Fear of Current or Ex-Partner: No    Emotionally Abused: No    Physically Abused: No    Sexually Abused: No   Family History  Problem Relation Age of Onset   Alcohol abuse Mother    Drug abuse Mother    Diabetes Mother    Depression Mother    Mental retardation Mother    Bipolar disorder Mother    Heart disease Father    Diabetes Father    Heart failure Sister    Stroke Maternal Grandfather    Diabetes Maternal Grandfather    Current Outpatient Medications on File Prior to Visit  Medication Sig   albuterol (PROVENTIL) (2.5 MG/3ML) 0.083% nebulizer solution Take 3 mLs (2.5 mg total) by nebulization every 4 (four) hours as needed for wheezing or shortness of breath.    cetirizine (ZYRTEC) 10 MG tablet Take 10 mg by mouth daily as needed for allergies.   chlorhexidine (PERIDEX) 0.12 % solution Use as directed 10 mLs in the mouth or throat.   escitalopram (LEXAPRO) 10 MG tablet Take 1 tablet (10 mg total) by mouth daily. (Patient not taking: Reported on 06/14/2023)   fluticasone (FLONASE) 50 MCG/ACT nasal  spray Place 1 spray into both nostrils daily. Use for 4-6 weeks then stop and use seasonally or as needed.   hydrOXYzine (ATARAX) 10 MG tablet Take 1 tablet (10 mg total) by mouth 3 (three) times daily as needed for anxiety.   montelukast (SINGULAIR) 10 MG tablet Take 10 mg by mouth at bedtime as needed.   Multiple Vitamins-Minerals (ONE-A-DAY WOMENS PO) Take 1 tablet by mouth daily.   Naltrexone-buPROPion HCl ER (CONTRAVE) 8-90 MG TB12 Start 1 tablet every morning for 7 days, then 1 tablet twice daily for 7 days, then 2 tablets every morning and one every evening   Spacer/Aero-Holding Chambers (AEROCHAMBER MV) inhaler Use as instructed   Vitamin D, Ergocalciferol, (DRISDOL) 1.25 MG (50000 UNIT) CAPS capsule Take 1 capsule (50,000 Units total) by mouth every 7 (seven) days.   No current facility-administered medications on file prior to visit.    Review of Systems  Constitutional:  Negative for activity change, appetite change, chills, diaphoresis, fatigue and fever.  HENT:  Negative for congestion and hearing loss.   Eyes:  Negative for visual disturbance.  Respiratory:  Negative for cough, chest tightness, shortness of breath and wheezing.   Cardiovascular:  Negative for chest pain, palpitations and leg swelling.  Gastrointestinal:  Negative for abdominal pain, constipation, diarrhea, nausea and vomiting.  Genitourinary:  Negative for dysuria, frequency and hematuria.  Musculoskeletal:  Negative for arthralgias and neck pain.  Skin:  Negative for rash.  Neurological:  Negative for dizziness, weakness, light-headedness, numbness and headaches.   Hematological:  Negative for adenopathy.  Psychiatric/Behavioral:  Negative for behavioral problems, dysphoric mood and sleep disturbance.    Per HPI unless specifically indicated above     Objective:    BP 118/78   Pulse 94   Ht 5\' 4"  (1.626 m)   Wt 261 lb 12.8 oz (118.8 kg)   LMP 09/11/2023   SpO2 98%   BMI 44.94 kg/m   Wt Readings from Last 3 Encounters:  10/11/23 261 lb 12.8 oz (118.8 kg)  06/22/23 264 lb 6.4 oz (119.9 kg)  06/14/23 265 lb 6.4 oz (120.4 kg)    Physical Exam Vitals and nursing note reviewed.  Constitutional:      General: She is not in acute distress.    Appearance: She is well-developed. She is not diaphoretic.     Comments: Well-appearing, comfortable, cooperative  HENT:     Head: Normocephalic and atraumatic.  Eyes:     General:        Right eye: No discharge.        Left eye: No discharge.     Conjunctiva/sclera: Conjunctivae normal.     Pupils: Pupils are equal, round, and reactive to light.  Neck:     Thyroid: No thyromegaly.     Vascular: No carotid bruit.  Cardiovascular:     Rate and Rhythm: Normal rate and regular rhythm.     Pulses: Normal pulses.     Heart sounds: Normal heart sounds. No murmur heard. Pulmonary:     Effort: Pulmonary effort is normal. No respiratory distress.     Breath sounds: Normal breath sounds. No wheezing or rales.  Abdominal:     General: Bowel sounds are normal. There is no distension.     Palpations: Abdomen is soft. There is no mass.     Tenderness: There is no abdominal tenderness.  Musculoskeletal:        General: No tenderness. Normal range of motion.     Cervical back:  Normal range of motion and neck supple.     Right lower leg: No edema.     Left lower leg: No edema.     Comments: Upper / Lower Extremities: - Normal muscle tone, strength bilateral upper extremities 5/5, lower extremities 5/5  Lymphadenopathy:     Cervical: No cervical adenopathy.  Skin:    General: Skin is warm and dry.      Findings: No erythema or rash.  Neurological:     Mental Status: She is alert and oriented to person, place, and time.     Comments: Distal sensation intact to light touch all extremities  Psychiatric:        Mood and Affect: Mood normal.        Behavior: Behavior normal.        Thought Content: Thought content normal.     Comments: Well groomed, good eye contact, normal speech and thoughts     Results for orders placed or performed in visit on 09/25/23  VITAMIN D 25 Hydroxy (Vit-D Deficiency, Fractures)  Result Value Ref Range   Vit D, 25-Hydroxy 16 (L) 30 - 100 ng/mL  TSH  Result Value Ref Range   TSH 2.13 mIU/L  Lipid panel  Result Value Ref Range   Cholesterol 220 (H) <200 mg/dL   HDL 46 (L) > OR = 50 mg/dL   Triglycerides 87 <323 mg/dL   LDL Cholesterol (Calc) 155 (H) mg/dL (calc)   Total CHOL/HDL Ratio 4.8 <5.0 (calc)   Non-HDL Cholesterol (Calc) 174 (H) <130 mg/dL (calc)  Hemoglobin F5D  Result Value Ref Range   Hgb A1c MFr Bld 6.0 (H) <5.7 % of total Hgb   Mean Plasma Glucose 126 mg/dL   eAG (mmol/L) 7.0 mmol/L  CBC with Differential/Platelet  Result Value Ref Range   WBC 4.7 3.8 - 10.8 Thousand/uL   RBC 4.60 3.80 - 5.10 Million/uL   Hemoglobin 11.9 11.7 - 15.5 g/dL   HCT 32.2 02.5 - 42.7 %   MCV 82.2 80.0 - 100.0 fL   MCH 25.9 (L) 27.0 - 33.0 pg   MCHC 31.5 (L) 32.0 - 36.0 g/dL   RDW 06.2 37.6 - 28.3 %   Platelets 272 140 - 400 Thousand/uL   MPV 10.6 7.5 - 12.5 fL   Neutro Abs 2,435 1,500 - 7,800 cells/uL   Absolute Lymphocytes 1,922 850 - 3,900 cells/uL   Absolute Monocytes 282 200 - 950 cells/uL   Eosinophils Absolute 52 15 - 500 cells/uL   Basophils Absolute 9 0 - 200 cells/uL   Neutrophils Relative % 51.8 %   Total Lymphocyte 40.9 %   Monocytes Relative 6.0 %   Eosinophils Relative 1.1 %   Basophils Relative 0.2 %  COMPLETE METABOLIC PANEL WITH GFR  Result Value Ref Range   Glucose, Bld 90 65 - 99 mg/dL   BUN 12 7 - 25 mg/dL   Creat 1.51 7.61 -  6.07 mg/dL   eGFR 371 > OR = 60 GG/YIR/4.85I6   BUN/Creatinine Ratio SEE NOTE: 6 - 22 (calc)   Sodium 140 135 - 146 mmol/L   Potassium 3.8 3.5 - 5.3 mmol/L   Chloride 106 98 - 110 mmol/L   CO2 27 20 - 32 mmol/L   Calcium 9.4 8.6 - 10.2 mg/dL   Total Protein 6.8 6.1 - 8.1 g/dL   Albumin 4.1 3.6 - 5.1 g/dL   Globulin 2.7 1.9 - 3.7 g/dL (calc)   AG Ratio 1.5 1.0 - 2.5 (calc)  Total Bilirubin 0.3 0.2 - 1.2 mg/dL   Alkaline phosphatase (APISO) 45 31 - 125 U/L   AST 9 (L) 10 - 30 U/L   ALT 6 6 - 29 U/L      Assessment & Plan:   Problem List Items Addressed This Visit     Hyperlipidemia   Mild persistent asthma without complication   Relevant Medications   albuterol (VENTOLIN HFA) 108 (90 Base) MCG/ACT inhaler   Morbid obesity with BMI of 40.0-44.9, adult (HCC)   Relevant Medications   WEGOVY 0.25 MG/0.5ML SOAJ   Pre-diabetes   Other Visit Diagnoses     Annual physical exam    -  Primary        Updated Health Maintenance information Reviewed recent lab results with patient Encouraged improvement to lifestyle with diet and exercise Goal of weight loss   Prediabetes A1c of 6.0, stable for the past three years. Family history of diabetes. Patient is making lifestyle modifications including diet, exercise, and intermittent fasting. -Continue lifestyle modifications. -Return in 3 months for weight check.  Hyperlipidemia LDL slightly elevated. Patient has recently switched to a more sedentary job and acknowledges some dietary indiscretions. Family history of hyperlipidemia. -Continue lifestyle modifications.  Morbid Obesity BMI >40 Patient has lost 4 pounds in the past 4 months. She has recently started a more sedentary job and is making dietary changes including intermittent fasting. S he is interested in starting [facility] for weight loss. -Start GNFAOZ 0.25mg  weekly injection. (If we cannot get it approved right away she can pick up sample if need) -Return in 3 months for  weight check and possible dose adjustment.  Asthma Patient requested a refill of her albuterol inhaler. -Refill albuterol inhaler.  Anemia, stable Hemoglobin of 11.9, which is within the patient's usual range and one of her best results in over a year. -No changes to current management.  General Health Maintenance Up to date on vaccines including flu shot.  Patient is aware of future screening recommendations for mammograms and colonoscopy. -Consider COVID booster at pharmacy if desired.     ____________________________________________________ Additional Rx Information (May be used for Prior Authorization if required)  Medication name and Strength: HYQMVH 0.25mg   Primary Diagnosis and ICD10 Code: Morbid Obesity BMI >40 (E66.01) Secondary Diagnosis and ICD10 Code: Hyperlipidemia (E78) Pre-Diabetes (R73.03) Previous Failed Medications Contrave Quantity and Duration of New Medication: 2 mL for 30 days Additional Supporting Information: This med is PREFERRED on Medicaid PDL. Will need baseline Weight + BMI and clinical info. ____________________________________________________    No orders of the defined types were placed in this encounter.   Meds ordered this encounter  Medications   albuterol (VENTOLIN HFA) 108 (90 Base) MCG/ACT inhaler    Sig: Inhale 2 puffs into the lungs every 6 (six) hours as needed for wheezing or shortness of breath.    Dispense:  18 g    Refill:  5   WEGOVY 0.25 MG/0.5ML SOAJ    Sig: Inject 0.25 mg into the skin once a week.    Dispense:  2 mL    Refill:  1     Follow up plan: Return in about 3 months (around 01/11/2024) for 3 month follow-up Weight management (any day, early AM preferred).  [name], DO [facility] 10/11/2023, 1:20 PM

## 2023-10-12 ENCOUNTER — Telehealth: Payer: Self-pay

## 2023-10-12 ENCOUNTER — Ambulatory Visit: Payer: Medicaid Other | Admitting: Internal Medicine

## 2023-10-12 NOTE — Telephone Encounter (Signed)
Jasmine Gordon Jasmine Gordon (KeyDwaine Gale) Rx #: [id number] [phone] 0.25MG /0.5ML auto-injectors Form [facility] [employer] Electronic [place] Form [phone] NCPDP) Created 21 hours ago Sent to Plan 1 hour ago Plan Response 1 hour ago Submit Clinical Questions 1 hour ago Determination Favorable 24 minutes ago Your prior authorization for Reginal Lutes has been approved! More Info Personalized support and financial assistance may be available through the [employer] program. For more information, and to see program requirements, click on the More Info button to the right.  Message from plan: PA Case: [id number], Status: Approved, Coverage Starts on: 10/12/2023 12:00:00 AM, Coverage Ends on: 04/09/2024 12:00:00 AM.. Authorization Expiration Date: Apr 09, 2024.

## 2023-10-12 NOTE — Telephone Encounter (Signed)
Jasmine Gordon (Key: Dwaine Gale)  Your information has been sent to [employer]  Wegovy 0.25MG /0.5ML

## 2023-10-13 NOTE — Telephone Encounter (Signed)
Lionel December farmer (KeyDwaine Gale) Rx #: [id number] [phone] 0.25MG /0.5ML auto-injectors Form [facility] Electronic [place] Form [phone] NCPDP) Created 2 days ago Sent to Plan 1 day ago Plan Response 1 day ago Submit Clinical Questions 1 day ago Determination Favorable 1 day ago Your prior authorization for Reginal Lutes has been approved! More Info Personalized support and financial assistance may be available through the [employer] program. For more information, and to see program requirements, click on the More Info button to the right.  Message from plan: PA Case: [id number], Status: Approved, Coverage Starts on: 10/12/2023 12:00:00 AM, Coverage Ends on: 04/09/2024 12:00:00 AM.. Authorization Expiration Date: Apr 09, 2024.

## 2023-11-01 ENCOUNTER — Encounter: Payer: Medicaid Other | Admitting: Internal Medicine

## 2023-11-01 DIAGNOSIS — G4733 Obstructive sleep apnea (adult) (pediatric): Secondary | ICD-10-CM

## 2023-11-01 NOTE — Progress Notes (Signed)
 This encounter was created in error - please disregard.

## 2023-11-05 ENCOUNTER — Encounter: Payer: Self-pay | Admitting: Family Medicine

## 2023-11-10 ENCOUNTER — Other Ambulatory Visit: Payer: Self-pay | Admitting: Obstetrics and Gynecology
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
## 2023-11-13 ENCOUNTER — Other Ambulatory Visit: Payer: Self-pay | Admitting: Licensed Clinical Social Worker

## 2023-11-13 NOTE — Patient Instructions (Signed)
Visit Information  [name] was given information about Medicaid Managed Care team care coordination services as a part of their Healthy [facility] Medicaid benefit. Jasmine Gordon verbally consented to engagement with the [facility] Managed Care team.   If you are experiencing a medical emergency, please call 911 or report to your local emergency department or urgent care.   If you have a non-emergency medical problem during routine business hours, please contact your provider's office and ask to speak with a nurse.   For questions related to your Healthy [facility] health plan, please call: [phone] or visit the homepage here: [email or web address]  If you would like to schedule transportation through your Healthy [facility] plan, please call the following number at least 2 days in advance of your appointment: [phone]  For information about your ride after you set it up, call Ride Assist at [phone]. Use this number to activate a Will Call pickup, or if your transportation is late for a scheduled pickup. Use this number, too, if you need to make a change or cancel a previously scheduled reservation.  If you need transportation services right away, call [phone]. The after-hours call center is staffed 24 hours to handle ride assistance and urgent reservation requests (including discharges) 365 days a year. Urgent trips include sick visits, hospital discharge requests and life-sustaining treatment.  Call the Jasmine Gordon at [phone], at any time, 24 hours a day, 7 days a week. If you are in danger or need immediate medical attention call 911.  If you would like help to quit smoking, call 1-800-QUIT-NOW (225-858-1137) OR Espaol: 1-855-Djelo-Ya (4-166-063-0160) o para ms informacin haga clic aqu or Text READY to 109-323 to register via text  Following is a copy of your plan of care:  Care Plan : LCSW Plan of  Care  Updates made by Jasmine Bryant, LCSW since 11/13/2023 12:00 AM     Problem: Anxiety Identification (Anxiety)      Long-Range Goal: To find a long term counselor in order to learn how to alleviate my anxiety and stress   Start Date: 02/14/2022  Priority: High  Note:    Timeframe:  Long-Range Goal Priority:  High Start Date:   02/14/22               Expected End Date:  ongoing   Follow Up Date--12/15/23 at 1 pm   - check out counseling - keep 90 percent of counseling appointments - schedule counseling appointment    Why is this important?             Beating depression may take some time.            If you don't feel better right away, don't give up on your treatment plan.     Current barriers:   Chronic Mental Health needs related to anxiety, panic and stress management  Mental Health Concerns  and Social Isolation Needs Support, Education, and Care Coordination in order to meet unmet mental health needs.   Clinical Goal(s): verbalize understanding of plan for management of Anxiety, Panic and Stress     Types of Stress There are two types of stress: Emotional - types of emotional stress are relationship problems, pressure at work, financial worries, experiencing discrimination or having a major life change. Physical - Examples of physical stress include being sick having pain, not sleeping well, recovery from an injury or having an alcohol and drug use disorder. Fight or Flight Sudden or ongoing  stress activates your nervous system and floods your bloodstream with adrenaline and cortisol, two hormones that raise blood pressure, increase heart rate and spike blood sugar. These changes pitch your body into a fight or flight response. That enabled our ancestors to outrun saber-toothed tigers, and it's helpful today for situations like dodging a car accident. But most modern chronic stressors, such as finances or a challenging relationship, keep your body in that heightened  state, which hurts your health. Effects of Too Much Stress If constantly under stress, most of [place] will eventually start to function less well.  Multiple studies link chronic stress to a higher risk of heart disease, stroke, depression, weight gain, memory loss and even premature death, so it's important to recognize the warning signals. Talk to your doctor about ways to manage stress if you're experiencing any of these symptoms: Prolonged periods of poor sleep. Regular, severe headaches. Unexplained weight loss or gain. Feelings of isolation, withdrawal or worthlessness. Constant anger and irritability. Loss of interest in activities. Constant worrying or obsessive thinking. Excessive alcohol or drug use. Inability to concentrate.   10 Ways to Cope with Chronic Stress It's key to recognize stressful situations as they occur because it allows you to focus on managing how you react. We all need to know when to close our eyes and take a deep breath when we feel tension rising. Use these tips to prevent or reduce chronic stress. 1. Rebalance Work and Home All work and no play? If you're spending too much time at the office, intentionally put more dates in your calendar to enjoy time for fun, either alone or with others. 2. Get Regular Exercise Moving your body on a regular basis balances the nervous system and increases blood circulation, helping to flush out stress hormones. Even a daily 20-minute walk makes a difference. Any kind of exercise can lower stress and improve your mood ? just pick activities that you enjoy and make it a regular habit. 3. Eat Well and Limit Alcohol and Stimulants Alcohol, nicotine and caffeine may temporarily relieve stress but have negative health impacts and can make stress worse in the long run. Well-nourished bodies cope better, so start with a good breakfast, add more organic fruits and vegetables for a well-balanced diet, avoid processed foods and sugar, try  herbal tea and drink more water. 4. Connect with Supportive People Talking face to face with another person releases hormones that reduce stress. Lean on those good listeners in your life. 5. Carve Out Hobby Time Do you enjoy gardening, reading, listening to music or some other creative pursuit? Engage in activities that bring you pleasure and joy; research shows that reduces stress by almost half and lowers your heart rate, too. 6. Practice Meditation, Stress Reduction or Yoga Relaxation techniques activate a state of restfulness that counterbalances your body's fight-or-flight hormones. Even if this also means a 10-minute break in a long day: listen to music, read, go for a walk in nature, do a hobby, take a bath or spend time with a friend. Also consider taking a mindfulness-based stress reduction course to learn effective, lasting tools or try a daily deep breathing or imagery practice. Deep Breathing Slow, calm and deep breathing can help you relax. Try these steps to focus on your breathing and repeat as needed. Find a comfortable position and close your eyes. Exhale and drop your shoulders. Breathe in through your nose; fill your lungs and then your belly. Think of relaxing your body, quieting your mind and  becoming calm and peaceful. Breathe out slowly through your nose, relaxing your belly. Think of releasing tension, pain, worries or distress. Repeat steps three and four until you feel relaxed. Imagery This involves using your mind to excite the senses -- sound, vision, smell, taste and feeling. This may help ease your stress. Begin by getting comfortable and then do some slow breathing. Imagine a place you love being at. It could be somewhere from your childhood, somewhere you vacationed or just a place in your imagination. Feel how it is to be in the place you're imagining. Pay attention to the sounds, air, colors, and who is there with you. This is a place where you feel cared for and  loved. All is well. You are safe. Take in all the smells, sounds, tastes and feelings. As you do, feel your body being nourished and healed. Feel the calm that surrounds you. Breathe in all the good. Breathe out any discomfort or tension. 7. Sleep Enough If you get less than seven to eight hours of sleep, your body won't tolerate stress as well as it could. If stress keeps you up at night, address the cause and add extra meditation into your day to make up for the lost z's. Try to get seven to nine hours of sleep each night. Make a regular bedtime schedule. Keep your room dark and cool. Try to avoid computers, TV, cell phones and tablets before bed. 8. Bond with Connections You Enjoy Go out for a coffee with a friend, chat with a neighbor, call a family member, visit with a clergy member, or even hang out with your pet. Clinical studies show that spending even a short time with a companion animal can cut anxiety levels almost in half. 9. Take a Vacation Getting away from it all can reset your stress tolerance by increasing your mental and emotional outlook, which makes you a happier, more productive person upon return. Leave your cellphone and laptop at home! 10. See a Counselor, Coach or Therapist If negative thoughts overwhelm your ability to make positive changes, it's time to seek professional help. Make an appointment today--your health and life are worth it.   Patient Goals/Self-Care Activities: Over the next 120 days Attend scheduled medical appointments Utilize healthy coping skills and supportive resources discussed Contact PCP with any questions or concerns   Follow up goal      24- Hour Availability:    [facility]  [address] [place], [place] [phone] Crisis [phone]   [facility] [phone]  [place] [facility]  [phone]    [facility]  [phone] (after hours)    Therapeutic Alternative/Mobile Crisis   [phone]   [place] National Suicide Hotline  859-703-0458 Len Child[place]) [place] [address]   Call [phone] for mental health emergencies   [facility]  [phone]);  [place] and [employer]  [phone]); [place], [place], [place], [place], [place], [place], [place]    [facility] Residents For 24/7 walk-up access to mental health services for [facility] children (4+), adolescents and adults, please visit the [facility] located at [address] in [place], [place].  *[facility] also provides comprehensive outpatient behavioral health services in a variety of locations around the [place].  Connect With [place] [address] [place], [place] [address] HelpLine: 787-718-6956 or 1-(331)258-1963  Get Directions  Find Help 24/7 By Phone Call  our 24-hour HelpLine at (281) 156-0749 or [phone] for immediate assistance for mental health and substance abuse issues.  Walk-In Help [place]: [facility] (Ages 4 and Up) [place]: Emergency Dept., [facility] Additional Resources National Hopeline Network: 1-800-SUICIDE The National Suicide Prevention Lifeline: 9-629-528-UXLK     Dickie La, BSW, MSW, LCSW Licensed Clinical Social Worker [employer]   [facility] [email or web address] Direct Dial: [phone]
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
## 2023-11-24 ENCOUNTER — Other Ambulatory Visit: Payer: Self-pay | Admitting: Family Medicine

## 2023-11-26 ENCOUNTER — Encounter: Payer: Self-pay | Admitting: Family Medicine

## 2023-11-27 MED ORDER — WEGOVY 0.25 MG/0.5ML ~~LOC~~ SOAJ: 0.2500 mg | SUBCUTANEOUS | 0 refills | Status: DC

## 2023-12-08 ENCOUNTER — Other Ambulatory Visit: Payer: Self-pay | Admitting: Obstetrics and Gynecology

## 2023-12-08 NOTE — Patient Outreach (Signed)
Medicaid Managed Care   Nurse Care Manager Note  12/08/2023 Name:  Jasmine Gordon MRN:  [id number] DOB:  1986-04-27  Jasmine Gordon is an 38 y.o. year old female who is a primary patient of [name], DO.  The [facility] Managed Care Coordination team was consulted for assistance with:    Chronic healthcare management needs, asthma, OSA, anxiety/depression/PTSD  Jasmine Gordon was given information about Medicaid Managed Care Coordination team services today. Anab [name] Patient agreed to services and verbal consent obtained.  Engaged with patient by telephone for follow up visit in response to provider referral for case management and/or care coordination services.   Patient is participating in a Managed Medicaid Plan:  Yes  Assessments/Interventions:  Review of past medical history, allergies, medications, health status, including review of consultants reports, laboratory and other test data, was performed as part of comprehensive evaluation and provision of chronic care management services.  SDOH (Social Drivers of Health) assessments and interventions performed: SDOH Interventions    Flowsheet Row Patient Outreach Telephone from 12/08/2023 in [place] POPULATION HEALTH DEPARTMENT Patient Outreach Telephone from 11/10/2023 in [place] POPULATION HEALTH DEPARTMENT Patient Outreach Telephone from 10/03/2023 in [place] POPULATION HEALTH DEPARTMENT Patient Outreach Telephone from 09/27/2023 in [place] POPULATION HEALTH DEPARTMENT Patient Outreach Telephone from 08/24/2023 in [place] POPULATION HEALTH DEPARTMENT Patient Outreach Telephone from 08/15/2023 in [place] POPULATION HEALTH DEPARTMENT  SDOH Interventions        Food Insecurity Interventions -- -- -- Intervention Not Indicated -- --  Housing Interventions -- -- -- Intervention Not Indicated -- --  Utilities Interventions Intervention Not Indicated -- -- -- -- --  Alcohol Usage  Interventions -- Intervention Not Indicated (Score <7) -- -- -- --  Stress Interventions -- -- [employer], Provide Counseling  [Pt reports being under a lot of work related stress at this time] -- [employer], Provide Counseling Offered [employer], Provide Counseling  [Actively seeing a counselor every Monday with [facility]]  Health Literacy Interventions Intervention Not Indicated -- -- -- -- --     Care Plan Allergies  Allergen Reactions   Reglan [Metoclopramide] Hives and Shortness Of Breath   Contrave [Naltrexone-Bupropion Hcl Er] Other (See Comments)    Had some reaction to the medication   Medications Reviewed Today     Reviewed by [name], RN (Registered Nurse) on 12/08/23 at 1439  Med List Status: <None>   Medication Order Taking? Sig Documenting Provider Last Dose Status Informant  albuterol (PROVENTIL) (2.5 MG/3ML) 0.083% nebulizer solution [id number] No Take 3 mLs (2.5 mg total) by nebulization every 4 (four) hours as needed for wheezing or shortness of breath. Merwyn Katos, MD Taking Expired 11/30/23 2359   albuterol (VENTOLIN HFA) 108 (90 Base) MCG/ACT inhaler [id number]  Inhale 2 puffs into the lungs every 6 (six) hours as needed for wheezing or shortness of breath. Karamalegos, Netta Neat, DO  Active   cetirizine (ZYRTEC) 10 MG tablet [id number] No Take 10 mg by mouth daily as needed for allergies. [provider] Taking Active   chlorhexidine (PERIDEX) 0.12 % solution [id number] No Use as directed 10 mLs in the mouth or throat. [provider] Taking Active   fluticasone (FLONASE) 50 MCG/ACT nasal spray [id number] No Place 1 spray into both nostrils daily. Use for 4-6 weeks then stop and use seasonally or as needed. Lorre Munroe, NP Taking Active   hydrOXYzine (ATARAX) 10 MG tablet  [id number] No Take 1 tablet (10 mg total) by mouth 3 (three) times daily as needed for anxiety.  Karamalegos, Netta Neat, DO Taking Active   montelukast (SINGULAIR) 10 MG tablet [id number] No Take 10 mg by mouth at bedtime as needed. [provider] Taking Active   Multiple Vitamins-Minerals (ONE-A-DAY WOMENS PO) [id number] No Take 1 tablet by mouth daily. [provider] Taking Active   Naltrexone-buPROPion HCl ER (CONTRAVE) 8-90 MG TB12 [id number] No Start 1 tablet every morning for 7 days, then 1 tablet twice daily for 7 days, then 2 tablets every morning and one every evening [name], DO Taking Active   Spacer/Aero-Holding Chambers (AEROCHAMBER MV) inhaler [id number] No Use as instructed [name], DO Taking Active   Vitamin D, Ergocalciferol, (DRISDOL) 1.25 MG (50000 UNIT) CAPS capsule [id number] No Take 1 capsule (50,000 Units total) by mouth every 7 (seven) days. [name], DO Taking Active   WEGOVY 0.25 MG/0.5ML Ivory Broad [id number]  Inject 0.25 mg into the skin once a week. [name], DO  Active             Patient Active Problem List   Diagnosis Date Noted   Latent tuberculosis 06/14/2023   Dysplasia of cervix, high grade CIN 2 02/23/2023   High grade squamous intraepithelial lesion (HGSIL), grade 3 CIN, on biopsy of cervix 02/23/2023   Vitamin B12 deficiency 06/09/2022   Palpitations 01/21/2022   Shortness of breath 01/21/2022   Chest pain 01/21/2022   Generalized anxiety disorder with panic attacks 09/27/2021   Environmental and seasonal allergies 11/04/2020   Genital warts 09/11/2018   Positive TB test 09/25/2017   Allergic contact dermatitis due to metals 05/17/2017   Pre-diabetes 03/06/2017   Hyperlipidemia 03/06/2017   Allergic reaction 02/08/2017   Urticaria due to food allergy 02/08/2017   Vitamin D deficiency 11/08/2016   Iron deficiency anemia due to chronic blood loss 10/03/2016   Menorrhagia with regular cycle 06/27/2016   Mild persistent asthma without complication 06/17/2016    Anxiety and depression 06/17/2016   Morbid obesity with BMI of 40.0-44.9, adult (HCC) 06/17/2016   PTSD (post-traumatic stress disorder) 2016   Conditions to be addressed/monitored per PCP order:  Chronic healthcare management needs, asthma, OSA, anxiety/depression/PTSD  Care Plan : RN Care Manager Plan of Care  Updates made by [name], RN since 12/08/2023 12:00 AM     Problem: Health Promotion or Disease Self-Management (General Plan of Care)      Long-Range Goal: Chronic Disease Management and Care Coordination Needs   Expected End Date: 03/07/2024  Priority: High  Note:   Current Barriers:  Knowledge Deficits related to plan of care for management of Asthma, anxiety/depression/PTSD, HLD  Chronic Disease Management support and education needs related to Asthma, anxiety/depression/PTSD, HLD 12/08/23:  patient plans to wean herself off of Wegovy-doesn't like the way it makes her feel-will discuss with her provider.  Breathing WNL-has PULM appt 1/20.  Continues [facility] weekly.  Son doing virtual school now.    RNCM Clinical Goal(s):  Patient will verbalize understanding of plan for management of Asthma, anxiety as evidenced by patient report verbalize basic understanding of  Asthma disease process and self health management plan as evidenced by patient report take all medications exactly as prescribed and will call provider for medication related questions as evidenced by patient report demonstrate understanding of rationale for each prescribed medication as evidenced by patient report attend all scheduled medical appointments: as evidenced by patient report continue  to work with RN Care Manager to address care management and care coordination needs related to  Asthma as evidenced by adherence to CM Team Scheduled appointments work with social worker to address  related to the management of Mental Health Concerns  related to the management of Anxiety, Depression, and PTSD, panic  attacks as evidenced by review of EMR and patient or social worker report  Interventions: Inter-disciplinary care team collaboration (see longitudinal plan of care) Evaluation of current treatment plan related to  self management and patient's adherence to plan as established by provider Collaborated with LCSW LCSW referral for new therapist as previous one left practice. 11/10/23:  Emotional support provided to patient today.  LCSW f/u appt scheduled.   Hyperlipidemia Interventions:  (Status:  New goal.) Long Term Goal Medication review performed; medication list updated in electronic medical record.  Counseled on importance of regular laboratory monitoring as prescribed Assessed social determinant of health barriers    Asthma: (Status:New goal.) Long Term Goal Advised patient to track and manage Asthma triggers Advised patient to self assesses Asthma action plan zone and make appointment with provider if in the yellow zone for 48 hours without improvement Discussed the importance of adequate rest and management of fatigue with Asthma Assessed social determinant of health barriers   Patient Goals/Self-Care Activities: Take all medications as prescribed Attend all scheduled provider appointments Call pharmacy for medication refills 3-7 days in advance of running out of medications Perform all self care activities independently  Perform IADL's (shopping, preparing meals, housekeeping, managing finances) independently Call provider office for new concerns or questions  Work with the social worker to address care coordination needs and will continue to work with the clinical team to address health care and disease management related needs  Follow Up Plan:  The patient has been provided with contact information for the care management team and has been advised to call with any health related questions or concerns.  The care management team will reach out to the patient again over the next  45 business  days.    Long-Range Goal: Establish Plan of Care for Chronic Disease Management Needs   Priority: High  Note:   Timeframe:  Long-Range Goal Priority:  High Start Date:   03/07/22                          Expected End Date:     ongoing                  Follow Up Date 01/15/24   - schedule appointment for flu shot - schedule appointment for vaccines needed due to my age or health - schedule recommended health tests (blood work, mammogram, colonoscopy, pap test) - schedule and keep appointment for annual check-up    Why is this important?   Screening tests can find diseases early when they are easier to treat.  Your doctor or nurse will talk with you about which tests are important for you.  Getting shots for common diseases like the flu and shingles will help prevent them.  12/08/23:  PULM 1/20 and PCP 2/20.  [facility] weekly.   Follow Up:  Patient agrees to Care Plan and Follow-up.  Plan: The Managed Medicaid care management team will reach out to the patient again over the next 45 business  days. and The  Patient has been provided with contact information for the Managed Medicaid care management team and has been advised to call  with any health related questions or concerns.  Date/time of next scheduled RN care management/care coordination outreach: 01/15/24 at 315.

## 2023-12-08 NOTE — Patient Instructions (Signed)
Hi [name] you for the updates, have a nice weekend.  [name] was given information about Medicaid Managed Care team care coordination services as a part of their Healthy [facility] benefit. Jasmine Gordon verbally consented to engagement with the [facility] Managed Care team.   If you are experiencing a medical emergency, please call 911 or report to your local emergency department or urgent care.   If you have a non-emergency medical problem during routine business hours, please contact your provider's office and ask to speak with a nurse.   For questions related to your Healthy [facility] health plan, please call: [phone] or visit the homepage here: [email or web address]  If you would like to schedule transportation through your Healthy [facility] plan, please call the following number at least 2 days in advance of your appointment: [phone]  For information about your ride after you set it up, call Ride Assist at [phone]. Use this number to activate a Will Call pickup, or if your transportation is late for a scheduled pickup. Use this number, too, if you need to make a change or cancel a previously scheduled reservation.  If you need transportation services right away, call [phone]. The after-hours call center is staffed 24 hours to handle ride assistance and urgent reservation requests (including discharges) 365 days a year. Urgent trips include sick visits, hospital discharge requests and life-sustaining treatment.  Call the [facility] Line at [phone], at any time, 24 hours a day, 7 days a week. If you are in danger or need immediate medical attention call 911.  If you would like help to quit smoking, call 1-800-QUIT-NOW (386 572 6536) OR Espaol: 1-855-Djelo-Ya (5-188-416-6063) o para ms informacin haga clic aqu or Text READY to 016-010 to register via text  [name]  following are the goals we discussed in your visit today:   Goals Addressed    Timeframe:  Long-Range Goal Priority:  High Start Date:   03/07/22                          Expected End Date:     ongoing                  Follow Up Date 01/15/24   - schedule appointment for flu shot - schedule appointment for vaccines needed due to my age or health - schedule recommended health tests (blood work, mammogram, colonoscopy, pap test) - schedule and keep appointment for annual check-up    Why is this important?   Screening tests can find diseases early when they are easier to treat.  Your doctor or nurse will talk with you about which tests are important for you.  Getting shots for common diseases like the flu and shingles will help prevent them.  12/08/23:  PULM 1/20 and PCP 2/20.  [facility] weekly.  Patient verbalizes understanding of instructions and care plan provided today and agrees to view in MyChart. Active MyChart status and patient understanding of how to access instructions and care plan via MyChart confirmed with patient.     The Managed Medicaid care management team will reach out to the patient again over the next 45 business  days.  The  Patient  has been provided with contact information for the Managed Medicaid care management team and has been advised to call with any health related questions or concerns.   [name] RN, BSN, [employer] Value-Based Care [employer] [facility] RN  Care Manager Direct Dial [phone]/DGU [phone] Website: [email or web address]   Following is a copy of your plan of care:  Care Plan : RN Care Manager Plan of Care  Updates made by [name], RN since 12/08/2023 12:00 AM     Problem: Health Promotion or Disease Self-Management (General Plan of Care)      Long-Range Goal: Chronic Disease Management and Care Coordination Needs   Expected End Date: 03/07/2024  Priority: High  Note:   Current Barriers:  Knowledge Deficits related to plan of  care for management of Asthma, anxiety/depression/PTSD, HLD  Chronic Disease Management support and education needs related to Asthma, anxiety/depression/PTSD, HLD 12/08/23:  patient plans to wean herself off of Wegovy-doesn't like the way it makes her feel-will discuss with her provider.  Breathing WNL-has PULM appt 1/20.  Continues [facility] weekly.  Son doing virtual school now.    RNCM Clinical Goal(s):  Patient will verbalize understanding of plan for management of Asthma, anxiety as evidenced by patient report verbalize basic understanding of  Asthma disease process and self health management plan as evidenced by patient report take all medications exactly as prescribed and will call provider for medication related questions as evidenced by patient report demonstrate understanding of rationale for each prescribed medication as evidenced by patient report attend all scheduled medical appointments: as evidenced by patient report continue to work with RN Care Manager to address care management and care coordination needs related to  Asthma as evidenced by adherence to CM Team Scheduled appointments work with social worker to address  related to the management of Mental Health Concerns  related to the management of Anxiety, Depression, and PTSD, panic attacks as evidenced by review of EMR and patient or social worker report  Interventions: Inter-disciplinary care team collaboration (see longitudinal plan of care) Evaluation of current treatment plan related to  self management and patient's adherence to plan as established by provider Collaborated with LCSW LCSW referral for new therapist as previous one left practice. 11/10/23:  Emotional support provided to patient today.  LCSW f/u appt scheduled.   Hyperlipidemia Interventions:  (Status:  New goal.) Long Term Goal Medication review performed; medication list updated in electronic medical record.  Counseled on importance of regular laboratory  monitoring as prescribed Assessed social determinant of health barriers    Asthma: (Status:New goal.) Long Term Goal Advised patient to track and manage Asthma triggers Advised patient to self assesses Asthma action plan zone and make appointment with provider if in the yellow zone for 48 hours without improvement Discussed the importance of adequate rest and management of fatigue with Asthma Assessed social determinant of health barriers   Patient Goals/Self-Care Activities: Take all medications as prescribed Attend all scheduled provider appointments Call pharmacy for medication refills 3-7 days in advance of running out of medications Perform all self care activities independently  Perform IADL's (shopping, preparing meals, housekeeping, managing finances) independently Call provider office for new concerns or questions  Work with the social worker to address care coordination needs and will continue to work with the clinical team to address health care and disease management related needs  Follow Up Plan:  The patient has been provided with contact information for the care management team and has been advised to call with any health related questions or concerns.  The care management team will reach out to the patient again over the next 45 business  days.

## 2023-12-15 ENCOUNTER — Other Ambulatory Visit: Payer: Self-pay | Admitting: Licensed Clinical Social Worker

## 2023-12-15 NOTE — Patient Outreach (Addendum)
  Medicaid Managed Care   Unsuccessful Attempt Note   12/15/2023 Name: Jasmine Gordon MRN: [id number] DOB: 09-18-1986  Referred by: [name], DO Reason for referral : No chief complaint on file.   An unsuccessful telephone outreach was attempted today. The patient was referred to the case management team for assistance with care management and care coordination.    Follow Up Plan: A HIPAA compliant phone message was left for the patient providing contact information and requesting a return call. Email was sent as well.   Dickie La, BSW, MSW, LCSW Licensed Clinical Social Worker [employer]   [email or web address] Direct Dial: [phone]

## 2023-12-15 NOTE — Patient Instructions (Signed)
Jasmine Gordon ,   The [facility] Managed Care Team is available to provide assistance to you with your healthcare needs at no cost and as a benefit of your [facility] Health plan. I'm sorry I was unable to reach you today for our scheduled appointment. Our care guide will call you to reschedule our telephone appointment. Please call me at the number below. I am available to be of assistance to you regarding your healthcare needs. .   Thank you,   Dickie La, BSW, MSW, LCSW Licensed Clinical Social Worker [employer]   [email or web address] Direct Dial: [phone]

## 2023-12-21 ENCOUNTER — Ambulatory Visit: Payer: Medicaid Other | Admitting: Internal Medicine

## 2023-12-27 ENCOUNTER — Encounter: Payer: Self-pay | Admitting: Hematology and Oncology

## 2023-12-27 NOTE — Progress Notes (Signed)
 Cardiology Clinic Note   Date: 12/29/2023 ID: Jasmine Gordon, DOB 13-May-1986, MRN [id number]  Primary Cardiologist:  Lonni Hanson, MD  Chief Complaint   Jasmine Gordon is a 38 y.o. female who presents to the clinic today for overdue annual follow up.   Patient Profile   Jasmine Gordon is followed by Dr. Hanson for the history outlined below.      Past medical history significant for: Palpitations. 14-day ZIO 02/14/2022: HR 53 to 170 bpm, average 91 bpm.  Predominantly sinus rhythm.  Rare PACs and PVCs.  Triggered events corresponded to sinus rhythm as well as rare PACs/PVCs.  No sustained arrhythmia or prolonged pauses. Chest pain/dyspnea. Echo 02/11/2022: EF 55 to 60%.  No RWMA.  Normal diastolic parameters.  Normal RV size/function.  Normal PA pressure, RVSP 23.8 mmHg.  No significant valvular abnormalities. Hyperlipidemia. Lipid panel 09/25/2023: LDL 155, HDL 46, TG 87, total 220. Iron  deficiency anemia. Asthma. Panic attacks/PTSD.  In summary, patient was first evaluated by Dr. Hanson on 01/21/2022 for palpitations, chest pain and shortness of breath.  She presented to [facility] ED the evening prior with same symptoms.  Workup was unremarkable other than mild hypokalemia and mild anemia.  Troponin was negative x 2.  EKG showed normal sinus rhythm with nonspecific T wave changes and borderline prolonged QT.  Patient reported a long history of anxiety and panic attacks that had been under control until she started working approximately a month prior.  She reported stress at work caused frequent episodes of palpitations, chest discomfort and shortness of breath.  Episodes have persisted despite quitting work 2 weeks prior.  14-day ZIO showed predominantly sinus rhythm as detailed above.  Echo showed normal LV function as detailed above.  Patient was last seen in the office by Cadence Furth, PA-C on 06/14/2022.  Coronary CTA had previously been ordered but she did not completed  secondary to feeling better.  She also reported not starting Lopressor  secondary to concern for potential side effects.  She had increased her hydration.  She reported no further palpitations and was overall feeling better.     History of Present Illness    Today, patient reports she has been feeling off for the past couple of days and feels she is fighting an illness. She reports several sick contacts at work. She has been under some increased stress recently with a situation with her son at school. She is doing well from a cardiac standpoint. Patient denies shortness of breath, dyspnea on exertion, lower extremity edema, orthopnea or PND. No chest pain, pressure, or tightness. She reports some palpitations related to stress. She will also have brief racing lasting a few seconds and resolving on its own around her menstrual cycle. Her heart rate is elevated today. She states she has been running since she woke up this morning. She wears a smart watch and tracks her heart rate periodically throughout the day and it is typically in the low 90s. She was started on Wegovy  by her PCP but it caused increased anxiety so she stopped taking it. She follows up with her PCP in a couple of weeks and will discuss it at that visit. She likes to walk for exercise weather permitting.     ROS: All other systems reviewed and are otherwise negative except as noted in History of Present Illness.  EKGs/Labs Reviewed    EKG Interpretation Date/Time:  Friday December 29 2023 08:36:02 EST Ventricular Rate:  116 PR Interval:  122 QRS  Duration:  76 QT Interval:  350 QTC Calculation: 486 R Axis:   8  Text Interpretation: Sinus tachycardia Nonspecific T wave abnormality When compared with ECG of 12-Apr-2023 08:20, Nonspecific T wave abnormality has replaced inverted T waves in Inferior leads Nonspecific T wave abnormality no longer evident in Anterior leads Confirmed by Loistine Sober [phone]) on 12/29/2023 8:44:50  AM   09/25/2023: ALT 6; AST 9; BUN 12; Creat 0.74; Potassium 3.8; Sodium 140   09/25/2023: Hemoglobin 11.9; WBC 4.7   09/25/2023: TSH 2.13    Physical Exam    VS:  BP 110/76 (BP Location: Left Arm, Patient Position: Sitting, Cuff Size: Large)   Pulse (!) 116   Ht 5' 4 (1.626 m)   Wt 257 lb (116.6 kg)   SpO2 99%   BMI 44.11 kg/m  , BMI Body mass index is 44.11 kg/m.  GEN: Well nourished, well developed, in no acute distress. Neck: No JVD or carotid bruits. Cardiac:  RRR. No murmurs. No rubs or gallops.   Respiratory:  Respirations regular and unlabored. Clear to auscultation without rales, wheezing or rhonchi. GI: Soft, nontender, nondistended. Extremities: Radials/DP/PT 2+ and equal bilaterally. No clubbing or cyanosis. No edema.  Skin: Warm and dry, no rash. Neuro: Strength intact.  Assessment & Plan   Palpitations 14-day ZIO March 2023 showed HR 53 to 170 bpm, average 91 bpm, rare PACs/PVCs.  Patient reports occasional palpitations related to increased stress. She will also feel her heart racing for a few seconds then resolving around her menstrual cycle.  EKG today shows sinus tachycardia, 116 bpm. During her visit her heart rate came down to 104. She relates increased HR to running since she woke up to get ready, get her kids to school, and getting here. She wears a smart watch and HR typically in the low 90s. She has declined lopressor  in the past.  -Continue to monitor on smart watch. -Contact the office for sustained tachycardia.   Chest pain/dyspnea Echo March 2023 showed normal LV/RV function, normal diastolic parameters, normal PA pressure, no significant valvular abnormalities.  Patient denies chest pain, pressure or tightness. She is not typically short of breath. She does not a change in breathing related to anxiety. She walks for exercise weather permitting with good tolerance. 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  -Continue to  monitor.  -Increase physical activity as tolerated.   Hyperlipidemia LDL November 2024 155, not at goal. -Continue lifestyle modifications. -Continue to follow with PCP.  Disposition: Return in 1 year or sooner as needed.          Signed, Sober HERO. Olajuwon Fosdick, DNP, NP-C

## 2023-12-29 ENCOUNTER — Encounter: Payer: Self-pay | Admitting: Student

## 2023-12-29 ENCOUNTER — Ambulatory Visit: Payer: Medicaid Other | Attending: Student | Admitting: Student

## 2023-12-29 VITALS — BP 110/76 | HR 116 | Ht 64.0 in | Wt 257.0 lb

## 2023-12-29 DIAGNOSIS — E7800 Pure hypercholesterolemia, unspecified: Secondary | ICD-10-CM | POA: Diagnosis not present

## 2023-12-29 DIAGNOSIS — R002 Palpitations: Secondary | ICD-10-CM

## 2023-12-29 DIAGNOSIS — I10 Essential (primary) hypertension: Secondary | ICD-10-CM | POA: Diagnosis not present

## 2023-12-29 DIAGNOSIS — R0602 Shortness of breath: Secondary | ICD-10-CM | POA: Diagnosis not present

## 2023-12-29 DIAGNOSIS — R079 Chest pain, unspecified: Secondary | ICD-10-CM | POA: Diagnosis not present

## 2023-12-29 NOTE — Patient Instructions (Signed)
 Medication Instructions:  Your Physician recommend you continue on your current medication as directed.    *If you need a refill on your cardiac medications before your next appointment, please call your pharmacy*   Lab Work: None ordered at this time  If you have labs (blood work) drawn today and your tests are completely normal, you will receive your results only by: MyChart Message (if you have MyChart) OR A paper copy in the mail If you have any lab test that is abnormal or we need to change your treatment, we will call you to review the results.   Follow-Up: At [facility], you and your health needs are our priority.  As part of our continuing mission to provide you with exceptional heart care, we have created designated Provider Care Teams.  These Care Teams include your primary Cardiologist (physician) and Advanced Practice Providers (APPs -  Physician Assistants and Nurse Practitioners) who all work together to provide you with the care you need, when you need it.  We recommend signing up for the patient portal called "MyChart".  Sign up information is provided on this After Visit Summary.  MyChart is used to connect with patients for Virtual Visits (Telemedicine).  Patients are able to view lab/test results, encounter notes, upcoming appointments, etc.  Non-urgent messages can be sent to your provider as well.   To learn more about what you can do with MyChart, go to [email or web address].    Your next appointment:   12 month(s)  Provider:   You may see Yvonne Kendall, MD or one of the following Advanced Practice Providers on your designated Care Team:   Nicolasa Ducking, NP Eula Listen, PA-C Cadence Fransico Michael, PA-C Charlsie Quest, NP Carlos Levering, NP

## 2024-01-10 ENCOUNTER — Ambulatory Visit: Payer: Medicaid Other | Admitting: Family Medicine

## 2024-01-10 ENCOUNTER — Encounter: Payer: Self-pay | Admitting: Family Medicine

## 2024-01-10 VITALS — BP 110/78 | HR 100 | Ht 64.0 in | Wt 261.0 lb

## 2024-01-10 DIAGNOSIS — Z6841 Body Mass Index (BMI) 40.0 and over, adult: Secondary | ICD-10-CM | POA: Diagnosis not present

## 2024-01-10 NOTE — Patient Instructions (Addendum)
 Thank you for coming to the office today.  Weight Loss goals to continue with natural diet and supplements Continue fiber Continue improving exercising with warmer weather  Remain OFF Wegovy for now due to mood side effect We cannot use Zepbound due to coverage and same side effects  Contrave, Phentermine seem to not be appropriate due to side effects  Recent Labs    09/25/23 0833  HGBA1C 6.0*    Please schedule a Follow-up Appointment to: Return in about 4 months (around 05/09/2024) for 4 month PreDM A1c, Weight updates.  If you have any other questions or concerns, please feel free to call the office or send a message through MyChart. You may also schedule an earlier appointment if necessary.  Additionally, you may be receiving a survey about your experience at our office within a few days to 1 week by e-mail or mail. We value your feedback.  [name], DO [facility], [place]

## 2024-01-10 NOTE — Progress Notes (Signed)
 Subjective:    Patient ID: Jasmine Gordon, female    DOB: [birth year], 38 y.o.   MRN: [id number]  Jasmine Gordon is a 38 y.o. female presenting on 01/10/2024 for Obesity (/)   HPI  Discussed the use of AI scribe software for clinical note transcription with the patient, who gave verbal consent to proceed.  History of Present Illness    Jasmine Gordon is a 38 year old female who presents with mood changes and weight management concerns after using Wegovy.  She has been using Wegovy at a dose of 0.25 mg for nearly three months, starting in late November. Initially, she experienced weight loss success, losing 10-15 lbs down to 253 pounds, but then hit a plateau. Despite dietary changes and increased exercise, she did not lose any additional weight. Her current weight is approximately 256 to 261 lbs, and she has been maintaining this weight with increased fluid intake and dietary adjustments, including reducing bread and sugar intake. She has been using natural methods such as apple cider vinegar and fiber supplements to aid in weight management.  Due to [facility] side effect - She began experiencing significant mood changes, including racing thoughts, increased depression, and irritability, particularly at work. She also noted increased irritability at home, affecting her interactions with her children. These symptoms prompted her to stop the medication after completing two months of treatment. Her mood has improved since discontinuing Wegovy, and she has been focusing on increasing her fiber intake and taking vitamin D supplements, which she feels has improved her mood.  She also experienced physical symptoms such as 'pins and needles' sensations in her stomach on random days while on Wegovy. She has a history of an allergic reaction to Contrave, which included hives and swelling in the throat, but she found it effective for mood and weight management before the reaction  occurred.  She mentions a family history of high blood pressure. But she has had normal BP readings.          01/10/2024    8:02 AM 10/11/2023    1:52 PM 03/07/2023   10:08 AM  Depression screen PHQ 2/9  Decreased Interest 1 1 1   Down, Depressed, Hopeless 1 1 0  PHQ - 2 Score 2 2 1   Altered sleeping 0 1 1  Tired, decreased energy 1 2 0  Change in appetite 0 1 0  Feeling bad or failure about yourself  1 2 1   Trouble concentrating 0 1 1  Moving slowly or fidgety/restless 0 0 0  Suicidal thoughts 0 0 0  PHQ-9 Score 4 9 4   Difficult doing work/chores Somewhat difficult Somewhat difficult Somewhat difficult       01/10/2024    8:03 AM 10/11/2023    1:53 PM 03/07/2023   10:09 AM 09/16/2022    1:12 PM  GAD 7 : Generalized Anxiety Score  Nervous, Anxious, on Edge 1 1 1 2   Control/stop worrying 1 0 1 2  Worry too much - different things 1 1 1 2   Trouble relaxing 0 0 1 2  Restless 0 0 1 0  Easily annoyed or irritable 1 1 1 1   Afraid - awful might happen 1 1 1 3   Total GAD 7 Score 5 4 7 12   Anxiety Difficulty Somewhat difficult Somewhat difficult Somewhat difficult Somewhat difficult    Social History   Tobacco Use   Smoking status: Never    Passive exposure: Never   Smokeless tobacco: Never  Vaping  Use   Vaping status: Never Used  Substance Use Topics   Alcohol use: Not Currently   Drug use: No    Review of Systems Per HPI unless specifically indicated above     Objective:    BP 110/78   Pulse 100   Ht 5\' 4"  (1.626 m)   Wt 261 lb (118.4 kg)   SpO2 98%   BMI 44.80 kg/m   Wt Readings from Last 3 Encounters:  01/10/24 261 lb (118.4 kg)  12/29/23 257 lb (116.6 kg)  10/11/23 261 lb 12.8 oz (118.8 kg)    Physical Exam Vitals and nursing note reviewed.  Constitutional:      General: She is not in acute distress.    Appearance: Normal appearance. She is well-developed. She is obese. She is not diaphoretic.     Comments: Well-appearing, comfortable,  cooperative  HENT:     Head: Normocephalic and atraumatic.  Eyes:     General:        Right eye: No discharge.        Left eye: No discharge.     Conjunctiva/sclera: Conjunctivae normal.  Cardiovascular:     Rate and Rhythm: Normal rate.  Pulmonary:     Effort: Pulmonary effort is normal.  Skin:    General: Skin is warm and dry.     Findings: No erythema or rash.  Neurological:     Mental Status: She is alert and oriented to person, place, and time.  Psychiatric:        Mood and Affect: Mood normal.        Behavior: Behavior normal.        Thought Content: Thought content normal.     Comments: Well groomed, good eye contact, normal speech and thoughts     Results for orders placed or performed in visit on 09/25/23  VITAMIN D 25 Hydroxy (Vit-D Deficiency, Fractures)   Collection Time: 09/25/23  8:33 AM  Result Value Ref Range   Vit D, 25-Hydroxy 16 (L) 30 - 100 ng/mL  TSH   Collection Time: 09/25/23  8:33 AM  Result Value Ref Range   TSH 2.13 mIU/L  Lipid panel   Collection Time: 09/25/23  8:33 AM  Result Value Ref Range   Cholesterol 220 (H) <200 mg/dL   HDL 46 (L) > OR = 50 mg/dL   Triglycerides 87 <098 mg/dL   LDL Cholesterol (Calc) 155 (H) mg/dL (calc)   Total CHOL/HDL Ratio 4.8 <5.0 (calc)   Non-HDL Cholesterol (Calc) 174 (H) <130 mg/dL (calc)  Hemoglobin J1B   Collection Time: 09/25/23  8:33 AM  Result Value Ref Range   Hgb A1c MFr Bld 6.0 (H) <5.7 % of total Hgb   Mean Plasma Glucose 126 mg/dL   eAG (mmol/L) 7.0 mmol/L  CBC with Differential/Platelet   Collection Time: 09/25/23  8:33 AM  Result Value Ref Range   WBC 4.7 3.8 - 10.8 Thousand/uL   RBC 4.60 3.80 - 5.10 Million/uL   Hemoglobin 11.9 11.7 - 15.5 g/dL   HCT 14.7 82.9 - 56.2 %   MCV 82.2 80.0 - 100.0 fL   MCH 25.9 (L) 27.0 - 33.0 pg   MCHC 31.5 (L) 32.0 - 36.0 g/dL   RDW 13.0 86.5 - 78.4 %   Platelets 272 140 - 400 Thousand/uL   MPV 10.6 7.5 - 12.5 fL   Neutro Abs 2,435 1,500 - 7,800  cells/uL   Absolute Lymphocytes 1,922 850 - 3,900 cells/uL   Absolute Monocytes  282 200 - 950 cells/uL   Eosinophils Absolute 52 15 - 500 cells/uL   Basophils Absolute 9 0 - 200 cells/uL   Neutrophils Relative % 51.8 %   Total Lymphocyte 40.9 %   Monocytes Relative 6.0 %   Eosinophils Relative 1.1 %   Basophils Relative 0.2 %  COMPLETE METABOLIC PANEL WITH GFR   Collection Time: 09/25/23  8:33 AM  Result Value Ref Range   Glucose, Bld 90 65 - 99 mg/dL   BUN 12 7 - 25 mg/dL   Creat 1.61 0.96 - 0.45 mg/dL   eGFR 409 > OR = 60 WJ/XBJ/4.78G9   BUN/Creatinine Ratio SEE NOTE: 6 - 22 (calc)   Sodium 140 135 - 146 mmol/L   Potassium 3.8 3.5 - 5.3 mmol/L   Chloride 106 98 - 110 mmol/L   CO2 27 20 - 32 mmol/L   Calcium 9.4 8.6 - 10.2 mg/dL   Total Protein 6.8 6.1 - 8.1 g/dL   Albumin 4.1 3.6 - 5.1 g/dL   Globulin 2.7 1.9 - 3.7 g/dL (calc)   AG Ratio 1.5 1.0 - 2.5 (calc)   Total Bilirubin 0.3 0.2 - 1.2 mg/dL   Alkaline phosphatase (APISO) 45 31 - 125 U/L   AST 9 (L) 10 - 30 U/L   ALT 6 6 - 29 U/L      Assessment & Plan:   Problem List Items Addressed This Visit     Morbid obesity with BMI of 40.0-44.9, adult (HCC) - Primary     Morbid Obesity BMI >44 Weight Management Previous medications - Contrave (allergic reaction swelling), Cannot tolerate Phentermine due to BP elevation risk Past efforts involve [facility] in [place], did not pursue referral  Patient reported initial success with Wegovy 0.25mg  for weight loss, but then experienced plateauing of weight loss and significant mood changes including irritability and depressive symptoms. Patient has stopped Wegovy 1 month ago and mood has improved.  - Note not eligible for Zepbound due to coverage and risk of side effect now  Patient has made dietary changes and is considering natural methods for weight loss. -Discontinue Wegovy due to mood side effects. -Consider natural methods for weight loss and lifestyle  modifications. -Consider future weight loss medications when available and appropriate.  Anxiety / PTSD / Mental Health Patient reported significant mood changes including irritability and depressive symptoms while on Wegovy. Mood has improved since discontinuing the medication. -Monitor mood closely, particularly in relation to any future weight loss medications.  BP Controlled -Continue current management and monitor blood pressure.   General Health Maintenance PreDM -Check A1C in 4 months (June 2025). -Full blood panel including cholesterol to be done in November 2025.        No orders of the defined types were placed in this encounter.   No orders of the defined types were placed in this encounter.   Follow up plan: Return in about 4 months (around 05/09/2024) for 4 month PreDM A1c, Weight updates.   [name], DO [facility] 01/10/2024, 8:11 AM

## 2024-01-11 ENCOUNTER — Ambulatory Visit: Payer: Medicaid Other | Admitting: Family Medicine

## 2024-01-15 ENCOUNTER — Other Ambulatory Visit: Payer: Self-pay | Admitting: Obstetrics and Gynecology

## 2024-01-15 NOTE — Patient Instructions (Signed)
 Visit Information  [name] was given information about Medicaid Managed Care team care coordination services as a part of their Healthy [facility] Medicaid benefit. Jasmine Gordon verbally consented to engagement with the [facility] Managed Care team.   If you are experiencing a medical emergency, please call 911 or report to your local emergency department or urgent care.   If you have a non-emergency medical problem during routine business hours, please contact your provider's office and ask to speak with a nurse.   For questions related to your Healthy [facility] health plan, please call: [phone] or visit the homepage here: [email or web address]  If you would like to schedule transportation through your Healthy [facility] plan, please call the following number at least 2 days in advance of your appointment: [phone]  For information about your ride after you set it up, call Ride Assist at [phone]. Use this number to activate a Will Call pickup, or if your transportation is late for a scheduled pickup. Use this number, too, if you need to make a change or cancel a previously scheduled reservation.  If you need transportation services right away, call [phone]. The after-hours call center is staffed 24 hours to handle ride assistance and urgent reservation requests (including discharges) 365 days a year. Urgent trips include sick visits, hospital discharge requests and life-sustaining treatment.  Call the [facility] Line at [phone], at any time, 24 hours a day, 7 days a week. If you are in danger or need immediate medical attention call 911.  If you would like help to quit smoking, call 1-800-QUIT-NOW (705-462-1530) OR Espaol: 1-855-Djelo-Ya (4-742-595-6387) o para ms informacin haga clic aqu or Text READY to 564-332 to register via text  [name] following are the goals we discussed in your visit  today:   Goals Addressed    Timeframe:  Long-Range Goal Priority:  High Start Date:   03/07/22                          Expected End Date:     ongoing                  Follow Up Date 03/14/24   - schedule appointment for flu shot - schedule appointment for vaccines needed due to my age or health - schedule recommended health tests (blood work, mammogram, colonoscopy, pap test) - schedule and keep appointment for annual check-up    Why is this important?   Screening tests can find diseases early when they are easier to treat.  Your doctor or nurse will talk with you about which tests are important for you.  Getting shots for common diseases like the flu and shingles will help prevent them.  01/15/24: recent appts with PCP and CARDS.  Going to find new therapist/counselor  Patient verbalizes understanding of instructions and care plan provided today and agrees to view in MyChart. Active MyChart status and patient understanding of how to access instructions and care plan via MyChart confirmed with patient.     The Managed Medicaid care management team will reach out to the patient again over the next 60 business  days.  The  Patient  has been provided with contact information for the Managed Medicaid care management team and has been advised to call with any health related questions or concerns.   [name] RN, BSN, [employer] [facility] Health RN Care Manager Direct Dial [phone] Website:  [email or web address]   Following is a copy of your plan of care:  Care Plan : RN Care Manager Plan of Care  Updates made by [name], RN since 01/15/2024 12:00 AM     Problem: Health Promotion or Disease Self-Management (General Plan of Care)      Long-Range Goal: Chronic Disease Management and Care Coordination Needs   Expected End Date: 04/13/2024  Priority: High  Note:   Current Barriers:  Knowledge Deficits related to plan of care for management of  Asthma, anxiety/depression/PTSD, HLD  Chronic Disease Management support and education needs related to Asthma, anxiety/depression/PTSD, HLD 01/15/24:  Recently seen by PCP-stopped Wegovy.  Denies SOB/palpitations.  Continues nursing school and work.  No complaints today.  States is going to find new therapist/counselor-declines LCSW today.  RNCM Clinical Goal(s):  Patient will verbalize understanding of plan for management of Asthma, anxiety as evidenced by patient report verbalize basic understanding of  Asthma disease process and self health management plan as evidenced by patient report take all medications exactly as prescribed and will call provider for medication related questions as evidenced by patient report demonstrate understanding of rationale for each prescribed medication as evidenced by patient report attend all scheduled medical appointments: as evidenced by patient report continue to work with RN Care Manager to address care management and care coordination needs related to  Asthma as evidenced by adherence to CM Team Scheduled appointments work with social worker to address  related to the management of Mental Health Concerns  related to the management of Anxiety, Depression, and PTSD, panic attacks as evidenced by review of EMR and patient or social worker report  Interventions: Inter-disciplinary care team collaboration (see longitudinal plan of care) Evaluation of current treatment plan related to  self management and patient's adherence to plan as established by provider Collaborated with LCSW LCSW referral for new therapist as previous one left practice. 11/10/23:  Emotional support provided to patient today.  LCSW f/u appt scheduled.   Hyperlipidemia Interventions:  (Status:  New goal.) Long Term Goal Medication review performed; medication list updated in electronic medical record.  Counseled on importance of regular laboratory monitoring as prescribed Assessed social  determinant of health barriers    Asthma: (Status:New goal.) Long Term Goal Advised patient to track and manage Asthma triggers Advised patient to self assesses Asthma action plan zone and make appointment with provider if in the yellow zone for 48 hours without improvement Discussed the importance of adequate rest and management of fatigue with Asthma Assessed social determinant of health barriers   Patient Goals/Self-Care Activities: Take all medications as prescribed Attend all scheduled provider appointments Call pharmacy for medication refills 3-7 days in advance of running out of medications Perform all self care activities independently  Perform IADL's (shopping, preparing meals, housekeeping, managing finances) independently Call provider office for new concerns or questions  Work with the social worker to address care coordination needs and will continue to work with the clinical team to address health care and disease management related needs  Follow Up Plan:  The patient has been provided with contact information for the care management team and has been advised to call with any health related questions or concerns.  The care management team will reach out to the patient again over the next 60 business  days.

## 2024-01-15 NOTE — Patient Outreach (Signed)
 Medicaid Managed Care   Nurse Care Manager Note  01/15/2024 Name:  Jasmine Gordon MRN:  [id number] DOB:  05-01-1986  Jasmine Gordon is an 38 y.o. year old female who is a primary patient of [name], DO.  The [facility] Managed Care Coordination team was consulted for assistance with:    Chronic healthcare management needs, asthma, OSA, anxiety/depression/PTSD  Jasmine Gordon was given information about Medicaid Managed Care Coordination team services today. Jasmine Gordon Patient agreed to services and verbal consent obtained.  Engaged with patient by telephone for follow up visit in response to provider referral for case management and/or care coordination services.   Patient is participating in a Managed Medicaid Plan:  Yes  Assessments/Interventions:  Review of past medical history, allergies, medications, health status, including review of consultants reports, laboratory and other test data, was performed as part of comprehensive evaluation and provision of chronic care management services.  SDOH (Social Drivers of Health) assessments and interventions performed: SDOH Interventions    Flowsheet Row Patient Outreach Telephone from 01/15/2024 in [place] POPULATION HEALTH DEPARTMENT Patient Outreach Telephone from 12/08/2023 in [place] POPULATION HEALTH DEPARTMENT Patient Outreach Telephone from 11/10/2023 in [place] POPULATION HEALTH DEPARTMENT Patient Outreach Telephone from 10/03/2023 in [place] POPULATION HEALTH DEPARTMENT Patient Outreach Telephone from 09/27/2023 in [place] POPULATION HEALTH DEPARTMENT Patient Outreach Telephone from 08/24/2023 in  POPULATION HEALTH DEPARTMENT  SDOH Interventions        Food Insecurity Interventions -- -- -- -- Intervention Not Indicated --  Housing Interventions -- -- -- -- Intervention Not Indicated --  Utilities Interventions -- Intervention Not Indicated -- -- -- --  Alcohol Usage  Interventions Intervention Not Indicated (Score <7) -- Intervention Not Indicated (Score <7) -- -- --  Stress Interventions -- -- -- [employer], Provide Counseling  [Pt reports being under a lot of work related stress at this time] -- [employer], Provide Counseling  Health Literacy Interventions -- Intervention Not Indicated -- -- -- --     Care Plan Allergies  Allergen Reactions   Reglan [Metoclopramide] Hives and Shortness Of Breath   Contrave [Naltrexone-Bupropion Hcl Er] Other (See Comments)    Had some reaction to the medication   Medications Reviewed Today     Reviewed by [name], RN (Registered Nurse) on 01/15/24 at 1506  Med List Status: <None>   Medication Order Taking? Sig Documenting Provider Last Dose Status Informant  albuterol (PROVENTIL) (2.5 MG/3ML) 0.083% nebulizer solution [id number] No Take 3 mLs (2.5 mg total) by nebulization every 4 (four) hours as needed for wheezing or shortness of breath. Merwyn Katos, MD Taking Expired 12/29/23 2359   albuterol (VENTOLIN HFA) 108 (90 Base) MCG/ACT inhaler [id number] No Inhale 2 puffs into the lungs every 6 (six) hours as needed for wheezing or shortness of breath. [name], DO Taking Active   cetirizine (ZYRTEC) 10 MG tablet [id number] No Take 10 mg by mouth daily as needed for allergies. [provider] Taking Active   chlorhexidine (PERIDEX) 0.12 % solution [id number] No Use as directed 10 mLs in the mouth or throat. [provider] Taking Active   cholecalciferol (VITAMIN D3) 25 MCG (1000 UNIT) tablet [id number] No Take 1,000 Units by mouth daily. [provider] Taking Active   fluticasone (FLONASE) 50 MCG/ACT nasal spray [id number] No Place 1 spray into both nostrils daily. Use for 4-6 weeks then stop and use seasonally or as needed.  Lorre Munroe, NP Taking Active   hydrOXYzine (ATARAX) 10 MG tablet [id number] No Take 1 tablet (10  mg total) by mouth 3 (three) times daily as needed for anxiety. Karamalegos, Netta Neat, DO Taking Active   montelukast (SINGULAIR) 10 MG tablet [id number] No Take 10 mg by mouth at bedtime as needed. [provider] Taking Active   Multiple Vitamins-Minerals (ONE-A-DAY WOMENS PO) [id number] No Take 1 tablet by mouth daily. [provider] Taking Active   Spacer/Aero-Holding Chambers (AEROCHAMBER MV) inhaler [id number] No Use as instructed  Patient not taking: Reported on 01/10/2024   [name], DO Not Taking Active             Patient Active Problem List   Diagnosis Date Noted   Latent tuberculosis 06/14/2023   Dysplasia of cervix, high grade CIN 2 02/23/2023   High grade squamous intraepithelial lesion (HGSIL), grade 3 CIN, on biopsy of cervix 02/23/2023   Vitamin B12 deficiency 06/09/2022   Palpitations 01/21/2022   Shortness of breath 01/21/2022   Chest pain 01/21/2022   Generalized anxiety disorder with panic attacks 09/27/2021   Environmental and seasonal allergies 11/04/2020   Genital warts 09/11/2018   Positive TB test 09/25/2017   Allergic contact dermatitis due to metals 05/17/2017   Pre-diabetes 03/06/2017   Hyperlipidemia 03/06/2017   Allergic reaction 02/08/2017   Urticaria due to food allergy 02/08/2017   Vitamin D deficiency 11/08/2016   Iron deficiency anemia due to chronic blood loss 10/03/2016   Menorrhagia with regular cycle 06/27/2016   Mild persistent asthma without complication 06/17/2016   Anxiety and depression 06/17/2016   Morbid obesity with BMI of 40.0-44.9, adult (HCC) 06/17/2016   PTSD (post-traumatic stress disorder) 2016   Conditions to be addressed/monitored per PCP order:  Chronic healthcare management needs, asthma, OSA, anxiety/depression/PTSD  Care Plan : RN Care Manager Plan of Care  Updates made by [name], RN since 01/15/2024 12:00 AM     Problem: Health Promotion or Disease Self-Management  (General Plan of Care)      Long-Range Goal: Chronic Disease Management and Care Coordination Needs   Expected End Date: 04/13/2024  Priority: High  Note:   Current Barriers:  Knowledge Deficits related to plan of care for management of Asthma, anxiety/depression/PTSD, HLD  Chronic Disease Management support and education needs related to Asthma, anxiety/depression/PTSD, HLD 01/15/24:  Recently seen by PCP-stopped Wegovy.  Denies SOB/palpitations.  Continues nursing school and work.  No complaints today.  States is going to find new therapist/counselor-declines LCSW today. Limited conversation today as patient buying car.  RNCM Clinical Goal(s):  Patient will verbalize understanding of plan for management of Asthma, anxiety as evidenced by patient report verbalize basic understanding of  Asthma disease process and self health management plan as evidenced by patient report take all medications exactly as prescribed and will call provider for medication related questions as evidenced by patient report demonstrate understanding of rationale for each prescribed medication as evidenced by patient report attend all scheduled medical appointments: as evidenced by patient report continue to work with RN Care Manager to address care management and care coordination needs related to  Asthma as evidenced by adherence to CM Team Scheduled appointments work with social worker to address  related to the management of Mental Health Concerns  related to the management of Anxiety, Depression, and PTSD, panic attacks as evidenced by review of EMR and patient or social worker report  Interventions: Inter-disciplinary care team collaboration (see longitudinal plan of  care) Evaluation of current treatment plan related to  self management and patient's adherence to plan as established by provider Collaborated with LCSW LCSW referral for new therapist as previous one left practice. 11/10/23:  Emotional support  provided to patient today.  LCSW f/u appt scheduled.   Hyperlipidemia Interventions:  (Status:  New goal.) Long Term Goal Medication review performed; medication list updated in electronic medical record.  Counseled on importance of regular laboratory monitoring as prescribed Assessed social determinant of health barriers    Asthma: (Status:New goal.) Long Term Goal Advised patient to track and manage Asthma triggers Advised patient to self assesses Asthma action plan zone and make appointment with provider if in the yellow zone for 48 hours without improvement Discussed the importance of adequate rest and management of fatigue with Asthma Assessed social determinant of health barriers   Patient Goals/Self-Care Activities: Take all medications as prescribed Attend all scheduled provider appointments Call pharmacy for medication refills 3-7 days in advance of running out of medications Perform all self care activities independently  Perform IADL's (shopping, preparing meals, housekeeping, managing finances) independently Call provider office for new concerns or questions  Work with the social worker to address care coordination needs and will continue to work with the clinical team to address health care and disease management related needs  Follow Up Plan:  The patient has been provided with contact information for the care management team and has been advised to call with any health related questions or concerns.  The care management team will reach out to the patient again over the next 60 business  days.     Long-Range Goal: Establish Plan of Care for Chronic Disease Management Needs   Priority: High  Note:   Timeframe:  Long-Range Goal Priority:  High Start Date:   03/07/22                          Expected End Date:     ongoing                  Follow Up Date 03/14/24   - schedule appointment for flu shot - schedule appointment for vaccines needed due to my age or health -  schedule recommended health tests (blood work, mammogram, colonoscopy, pap test) - schedule and keep appointment for annual check-up    Why is this important?   Screening tests can find diseases early when they are easier to treat.  Your doctor or nurse will talk with you about which tests are important for you.  Getting shots for common diseases like the flu and shingles will help prevent them.  01/15/24: recent appts with PCP and CARDS.  Going to find new therapist/counselor   Follow Up:  Patient agrees to Care Plan and Follow-up.  Plan: The Managed Medicaid care management team will reach out to the patient again over the next 60 business  days. and The  Patient has been provided with contact information for the Managed Medicaid care management team and has been advised to call with any health related questions or concerns.  Date/time of next scheduled RN care management/care coordination outreach: 03/14/24 at 230

## 2024-01-16 ENCOUNTER — Other Ambulatory Visit: Payer: Self-pay | Admitting: Licensed Clinical Social Worker
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
## 2024-01-16 NOTE — Patient Instructions (Signed)
 Visit Information  [name] was given information about Medicaid Managed Care team care coordination services as a part of their Healthy [facility] Medicaid benefit. Jasmine Gordon verbally consented to engagement with the [facility] Managed Care team.   If you are experiencing a medical emergency, please call 911 or report to your local emergency department or urgent care.   If you have a non-emergency medical problem during routine business hours, please contact your provider's office and ask to speak with a nurse.   For questions related to your Healthy [facility] health plan, please call: [phone] or visit the homepage here: [email or web address]  If you would like to schedule transportation through your Healthy [facility] plan, please call the following number at least 2 days in advance of your appointment: [phone]  For information about your ride after you set it up, call Ride Assist at [phone]. Use this number to activate a Will Call pickup, or if your transportation is late for a scheduled pickup. Use this number, too, if you need to make a change or cancel a previously scheduled reservation.  If you need transportation services right away, call [phone]. The after-hours call center is staffed 24 hours to handle ride assistance and urgent reservation requests (including discharges) 365 days a year. Urgent trips include sick visits, hospital discharge requests and life-sustaining treatment.  Call the [facility] Line at [phone], at any time, 24 hours a day, 7 days a week. If you are in danger or need immediate medical attention call 911.  If you would like help to quit smoking, call 1-800-QUIT-NOW ((825)803-8489) OR Espaol: 1-855-Djelo-Ya (4-742-595-6387) o para ms informacin haga clic aqu or Text READY to 564-332 to register via text  Following is a copy of your plan of care:  Care Plan : LCSW Plan of  Care  Updates made by Gustavus Bryant, LCSW since 01/16/2024 12:00 AM     Problem: Anxiety Identification (Anxiety)      Long-Range Goal: To find a long term counselor in order to learn how to alleviate my anxiety and stress   Start Date: 02/14/2022  Priority: High  Note:    Timeframe:  Long-Range Goal Priority:  High Start Date:   02/14/22               Expected End Date:  ongoing   Follow Up Date--Goal met and ended 01/16/24   - check out counseling - keep 90 percent of counseling appointments - schedule counseling appointment    Why is this important?             Beating depression may take some time.            If you don't feel better right away, don't give up on your treatment plan.     Current barriers:   Chronic Mental Health needs related to anxiety, panic and stress management  Mental Health Concerns  and Social Isolation Needs Support, Education, and Care Coordination in order to meet unmet mental health needs.   Clinical Goal(s): verbalize understanding of plan for management of Anxiety, Panic and Stress     10 Ways to Cope with Chronic Stress It's key to recognize stressful situations as they occur because it allows you to focus on managing how you react. We all need to know when to close our eyes and take a deep breath when we feel tension rising. Use these tips to prevent or reduce chronic stress. 1. Rebalance Work and Home All  work and no play? If you're spending too much time at the office, intentionally put more dates in your calendar to enjoy time for fun, either alone or with others. 2. Get Regular Exercise Moving your body on a regular basis balances the nervous system and increases blood circulation, helping to flush out stress hormones. Even a daily 20-minute walk makes a difference. Any kind of exercise can lower stress and improve your mood ? just pick activities that you enjoy and make it a regular habit. 3. Eat Well and Limit Alcohol and  Stimulants Alcohol, nicotine and caffeine may temporarily relieve stress but have negative health impacts and can make stress worse in the long run. Well-nourished bodies cope better, so start with a good breakfast, add more organic fruits and vegetables for a well-balanced diet, avoid processed foods and sugar, try herbal tea and drink more water. 4. Connect with Supportive People Talking face to face with another person releases hormones that reduce stress. Lean on those good listeners in your life. 5. Carve Out Hobby Time Do you enjoy gardening, reading, listening to music or some other creative pursuit? Engage in activities that bring you pleasure and joy; research shows that reduces stress by almost half and lowers your heart rate, too. 6. Practice Meditation, Stress Reduction or Yoga Relaxation techniques activate a state of restfulness that counterbalances your body's fight-or-flight hormones. Even if this also means a 10-minute break in a long day: listen to music, read, go for a walk in nature, do a hobby, take a bath or spend time with a friend. Also consider taking a mindfulness-based stress reduction course to learn effective, lasting tools or try a daily deep breathing or imagery practice. Deep Breathing Slow, calm and deep breathing can help you relax. Try these steps to focus on your breathing and repeat as needed. Find a comfortable position and close your eyes. Exhale and drop your shoulders. Breathe in through your nose; fill your lungs and then your belly. Think of relaxing your body, quieting your mind and becoming calm and peaceful. Breathe out slowly through your nose, relaxing your belly. Think of releasing tension, pain, worries or distress. Repeat steps three and four until you feel relaxed. Imagery This involves using your mind to excite the senses -- sound, vision, smell, taste and feeling. This may help ease your stress. Begin by getting comfortable and then do some slow  breathing. Imagine a place you love being at. It could be somewhere from your childhood, somewhere you vacationed or just a place in your imagination. Feel how it is to be in the place you're imagining. Pay attention to the sounds, air, colors, and who is there with you. This is a place where you feel cared for and loved. All is well. You are safe. Take in all the smells, sounds, tastes and feelings. As you do, feel your body being nourished and healed. Feel the calm that surrounds you. Breathe in all the good. Breathe out any discomfort or tension. 7. Sleep Enough If you get less than seven to eight hours of sleep, your body won't tolerate stress as well as it could. If stress keeps you up at night, address the cause and add extra meditation into your day to make up for the lost z's. Try to get seven to nine hours of sleep each night. Make a regular bedtime schedule. Keep your room dark and cool. Try to avoid computers, TV, cell phones and tablets before bed. 8. Bond with Connections You  Enjoy Go out for a coffee with a friend, chat with a neighbor, call a family member, visit with a clergy member, or even hang out with your pet. Clinical studies show that spending even a short time with a companion animal can cut anxiety levels almost in half. 9. Take a Vacation Getting away from it all can reset your stress tolerance by increasing your mental and emotional outlook, which makes you a happier, more productive person upon return. Leave your cellphone and laptop at home! 10. See a Counselor, Coach or Therapist If negative thoughts overwhelm your ability to make positive changes, it's time to seek professional help. Make an appointment today--your health and life are worth it.   Patient Goals/Self-Care Activities: Over the next 120 days Attend scheduled medical appointments Utilize healthy coping skills and supportive resources discussed Contact PCP with any questions or concerns   Follow up  goal     24- Hour Availability:    [facility]  [address] [place], [place] [phone] Crisis [phone]   [facility] [phone]  [place] [facility]  [phone]    [facility]  [phone] (after hours)   Therapeutic Alternative/Mobile Crisis   [phone]   [place] National Suicide Hotline  586-595-2398 [place]) [place] [address]   Call [phone] for mental health emergencies   [facility]  [phone]);  [place] and [employer]  [phone]); [place], [place], [place], [place], [place], [place], [place]    [facility] Residents For 24/7 walk-up access to mental health services for [facility] children (4+), adolescents and adults, please visit the [facility] located at [address] in [place], [place].  *[facility] also provides comprehensive outpatient behavioral health services in a variety of locations around the [place].  Connect With [place] [address] [place], [place] [address] HelpLine: 601-739-9879 or 1-7262230263  Get Directions  Find Help 24/7 By Phone Call our 24-hour HelpLine at (802)444-7747 or 308-159-8374 for immediate assistance for mental health and substance abuse issues.  Walk-In Help [place]: [facility] (Ages 4 and Up) [place]: Emergency Dept., [facility] Additional Resources National Hopeline Network: 1-800-SUICIDE The National Suicide Prevention Lifeline: 8-101-751-WCHE     Dickie La, BSW, MSW, LCSW Licensed Clinical Social Worker [employer]   [facility] [email or web address] Direct Dial: [phone]

## 2024-02-26 ENCOUNTER — Emergency Department

## 2024-02-26 ENCOUNTER — Emergency Department
Admission: EM | Admit: 2024-02-26 | Discharge: 2024-02-26 | Disposition: A | Discharge status: Home / Self Care | Attending: Emergency Medicine | Admitting: Emergency Medicine

## 2024-02-26 ENCOUNTER — Other Ambulatory Visit: Payer: Self-pay

## 2024-02-26 DIAGNOSIS — R059 Cough, unspecified: Secondary | ICD-10-CM | POA: Diagnosis not present

## 2024-02-26 DIAGNOSIS — R0602 Shortness of breath: Secondary | ICD-10-CM | POA: Diagnosis not present

## 2024-02-26 DIAGNOSIS — J45909 Unspecified asthma, uncomplicated: Secondary | ICD-10-CM | POA: Diagnosis not present

## 2024-02-26 DIAGNOSIS — R051 Acute cough: Secondary | ICD-10-CM | POA: Insufficient documentation

## 2024-02-26 LAB — BASIC METABOLIC PANEL WITH GFR
Anion gap: 8 (ref 5–15)
BUN: 9 mg/dL (ref 6–20)
CO2: 23 mmol/L (ref 22–32)
Calcium: 9.1 mg/dL (ref 8.9–10.3)
Chloride: 105 mmol/L (ref 98–111)
Creatinine, Ser: 0.77 mg/dL (ref 0.44–1.00)
GFR, Estimated: >60 mL/min (ref >60)
Glucose, Bld: 97 mg/dL (ref 70–99)
Potassium: 3.6 mmol/L (ref 3.5–5.1)
Sodium: 136 mmol/L (ref 135–145)

## 2024-02-26 LAB — CBC
HCT: 37.4 % (ref 36.0–46.0)
Hemoglobin: 11.8 g/dL — ABNORMAL LOW (ref 12.0–15.0)
MCH: 26.2 pg (ref 26.0–34.0)
MCHC: 31.6 g/dL (ref 30.0–36.0)
MCV: 82.9 fL (ref 80.0–100.0)
Platelets: 234 10*3/uL (ref 150–400)
RBC: 4.51 MIL/uL (ref 3.87–5.11)
RDW: 13.4 % (ref 11.5–15.5)
WBC: 4.3 10*3/uL (ref 4.0–10.5)
nRBC: 0 % (ref 0.0–0.2)

## 2024-02-26 LAB — RESP PANEL BY RT-PCR (RSV, FLU A&B, COVID)  RVPGX2
Influenza A by PCR: NEGATIVE
Influenza B by PCR: NEGATIVE
Resp Syncytial Virus by PCR: NEGATIVE
SARS Coronavirus 2 by RT PCR: NEGATIVE

## 2024-02-26 LAB — POC URINE PREG, ED: Preg Test, Ur: NEGATIVE

## 2024-02-26 LAB — D-DIMER, QUANTITATIVE: D-Dimer, Quant: 0.28 ug{FEU}/mL (ref 0.00–0.50)

## 2024-02-26 LAB — TROPONIN I (HIGH SENSITIVITY): Troponin I (High Sensitivity): <2 ng/L (ref <18)

## 2024-02-26 MED ORDER — IPRATROPIUM-ALBUTEROL 0.5-2.5 (3) MG/3ML IN SOLN
3.0000 mL | Freq: Once | RESPIRATORY_TRACT | Status: AC
  Administered 2024-02-26: 3 mL via RESPIRATORY_TRACT
  Filled 2024-02-26: qty 3

## 2024-02-26 NOTE — ED Provider Notes (Signed)
 [facility] Provider Note    Event Date/Time   First MD Initiated Contact with Patient 02/26/24 [phone]     (approximate)   History   Chief Complaint Shortness of Breath   HPI  Jasmine Gordon is a 38 y.o. female with past medical history of asthma, migraines, and anxiety who presents to the ED complaining of shortness of breath.  Patient reports that she has been feeling intermittently short of breath for about the past week, had some pain around her right shoulder blade a few days ago that has since resolved.  This been associated with a dry cough but she denies any fevers, has not had any pain or swelling in her legs.  She has not had any pain in her chest with the symptoms.     Physical Exam   Triage Vital Signs: ED Triage Vitals  Encounter Vitals Group     BP 02/26/24 0858 (!) 127/93     Systolic BP Percentile --      Diastolic BP Percentile --      Pulse Rate 02/26/24 0858 (!) 116     Resp 02/26/24 0858 18     Temp 02/26/24 0858 98.8 F (37.1 C)     Temp Source 02/26/24 0858 Oral     SpO2 02/26/24 0858 99 %     Weight 02/26/24 0859 261 lb 0.4 oz (118.4 kg)     Height 02/26/24 0859 5\' 4"  (1.626 m)     Head Circumference --      Peak Flow --      Pain Score 02/26/24 0858 0     Pain Loc --      Pain Education --      Exclude from Growth Chart --     Most recent vital signs: Vitals:   02/26/24 0858  BP: (!) 127/93  Pulse: (!) 116  Resp: 18  Temp: 98.8 F (37.1 C)  SpO2: 99%    Constitutional: Alert and oriented. Eyes: Conjunctivae are normal. Head: Atraumatic. Nose: No congestion/rhinnorhea. Mouth/Throat: Mucous membranes are moist.  Cardiovascular: Tachycardic, regular rhythm. Grossly normal heart sounds.  2+ radial pulses bilaterally. Respiratory: Normal respiratory effort.  No retractions. Lungs CTAB. Gastrointestinal: Soft and nontender. No distention. Musculoskeletal: No lower extremity tenderness nor edema.   Neurologic:  Normal speech and language. No gross focal neurologic deficits are appreciated.    ED Results / Procedures / Treatments   Labs (all labs ordered are listed, but only abnormal results are displayed) Labs Reviewed  CBC - Abnormal; Notable for the following components:      Result Value   Hemoglobin 11.8 (*)    All other components within normal limits  RESP PANEL BY RT-PCR (RSV, FLU A&B, COVID)  RVPGX2  BASIC METABOLIC PANEL WITH GFR  D-DIMER, QUANTITATIVE  POC URINE PREG, ED  TROPONIN I (HIGH SENSITIVITY)     EKG  ED ECG REPORT I, Chesley Noon, the attending physician, personally viewed and interpreted this ECG.   Date: 02/26/2024  EKG Time: 9:01  Rate: 114  Rhythm: sinus tachycardia  Axis: Normal  Intervals:none  ST&T Change: Inferior T wave inversions  RADIOLOGY Chest x-ray reviewed and interpreted by me shows no infiltrate, edema, effusion.  PROCEDURES:  Critical Care performed: No  Procedures   MEDICATIONS ORDERED IN ED: Medications  ipratropium-albuterol (DUONEB) 0.5-2.5 (3) MG/3ML nebulizer solution 3 mL (3 mLs Nebulization Given 02/26/24 0957)     IMPRESSION / MDM / ASSESSMENT AND PLAN / ED  COURSE  I reviewed the triage vital signs and the nursing notes.                              38 y.o. female with past medical history of asthma, migraines, and anxiety who presents to the ED with 1 week of intermittent shortness of breath with dry cough and some pain around her right shoulder blade.  Patient's presentation is most consistent with acute presentation with potential threat to life or bodily function.  Differential diagnosis includes, but is not limited to, ACS, PE, pneumonia, pneumothorax, bronchitis, CHF, asthma exacerbation, anemia, electrolyte abnormality.  Patient nontoxic-appearing and in no acute distress, vital signs remarkable for tachycardia but otherwise reassuring.  EKG shows sinus tachycardia with inferior T wave  inversions, symptoms seem atypical for ACS but will add on troponin given EKG changes.  Will also check D-dimer to rule out PE as patient is low risk by Wells criteria.  Chest x-ray is unremarkable, labs thus far without significant anemia, leukocytosis, electrolyte abnormality, or AKI.  Viral testing is negative for COVID and flu, will trial DuoNeb while awaiting additional results.  D-dimer and troponin within normal limits, doubt ACS or PE.  Patient continues to breathe comfortably here in the ED, suspect there is a component of allergies versus URI.  She is appropriate for discharge home with outpatient follow-up, was counseled to return to the ED for new or worsening symptoms.  Patient agrees with plan.      FINAL CLINICAL IMPRESSION(S) / ED DIAGNOSES   Final diagnoses:  Shortness of breath  Acute cough     Rx / DC Orders   ED Discharge Orders     None        Note:  This document was prepared using Dragon voice recognition software and may include unintentional dictation errors.   Chesley Noon, MD 02/26/24 [phone]

## 2024-02-26 NOTE — Progress Notes (Unsigned)
 PCP:  [name], DO   No chief complaint on file.    HPI:      [name] is a 38 y.o. [phone] who LMP was Patient's last menstrual period was 02/01/2024 (exact date)., presents today for her annual examination.  Her menses are regular every 28-30 days, lasting 6 days, light to mod flow.  Dysmenorrhea mild. She does not have intermenstrual bleeding.  Sex activity: single partner, contraception - condoms. Declines other BC. Had paragard in past.  Last Pap: 02/23/23 Results NILM/neg HPV DNA; repeat pap due 12/17/21  Results were: no abnormalities /neg HPV DNA 12/22/20 CIN 2 on colpo bx with Dr. Jean Rosenthal; LEEP 01/15/21 with CIN 2-3 12/08/20 LGSIL/POS HPV DNA 11/26/19 neg pap/POS HPV DNA.  Hx of STDs: ext HPV lesions; treated with TCA 08/22/18.   There is no FH of breast cancer. There is no FH of ovarian cancer. The patient does do self-breast exams.  Tobacco use: The patient denies current or previous tobacco use. Alcohol use: none No drug use.  Exercise: moderately active  She does get adequate calcium but not Vitamin D in her diet.   Past Medical History:  Diagnosis Date   Allergy    Anemia    Anxiety    Asthma    HPV (human papilloma virus) infection    Migraine    Obese    Panic attack    PTSD (post-traumatic stress disorder) 2016    Past Surgical History:  Procedure Laterality Date   APPENDECTOMY     LEEP  12/2020   CIN II, negative margins   Plantar warts     WISDOM TOOTH EXTRACTION      Family History  Problem Relation Age of Onset   Alcohol abuse Mother    Drug abuse Mother    Diabetes Mother    Depression Mother    Mental retardation Mother    Bipolar disorder Mother    Heart disease Father    Diabetes Father    Heart failure Sister    Stroke Maternal Grandfather    Diabetes Maternal Grandfather     Social History   Socioeconomic History   Marital status: Married    Spouse name: Not on file   Number of children: Not on  file   Years of education: Not on file   Highest education level: Some college, no degree  Occupational History   Not on file  Tobacco Use   Smoking status: Never    Passive exposure: Never   Smokeless tobacco: Never  Vaping Use   Vaping status: Never Used  Substance and Sexual Activity   Alcohol use: Not Currently   Drug use: No   Sexual activity: Yes    Birth control/protection: Condom, None  Other Topics Concern   Not on file  Social History Narrative   Not on file   Social Drivers of Health   Financial Resource Strain: Medium Risk (01/08/2024)   Overall Financial Resource Strain (CARDIA)    Difficulty of Paying Living Expenses: Somewhat hard  Food Insecurity: No Food Insecurity (01/08/2024)   Hunger Vital Sign    Worried About Running Out of Food in the Last Year: Never true    Ran Out of Food in the Last Year: Never true  Transportation Needs: No Transportation Needs (01/08/2024)   PRAPARE - Administrator, Civil Service (Medical): No    Lack of Transportation (Non-Medical): No  Physical Activity: Insufficiently Active (01/08/2024)  Exercise Vital Sign    Days of Exercise per Week: 2 days    Minutes of Exercise per Session: 20 min  Stress: No Stress Concern Present (01/16/2024)   [employer] of Occupational Health - Occupational Stress Questionnaire    Feeling of Stress : Only a little  Recent Concern: Stress - Stress Concern Present (01/08/2024)   [employer] of Occupational Health - Occupational Stress Questionnaire    Feeling of Stress : To some extent  Social Connections: Moderately Integrated (01/08/2024)   Social Connection and Isolation Panel [NHANES]    Frequency of Communication with Friends and Family: Three times a week    Frequency of Social Gatherings with Friends and Family: Never    Attends Religious Services: More than 4 times per year    Active Member of [employer] or Organizations: Yes    Attends Banker Meetings: More  than 4 times per year    Marital Status: Separated  Intimate Partner Violence: Not At Risk (01/15/2024)   Humiliation, Afraid, Rape, and Kick questionnaire    Fear of Current or Ex-Partner: No    Emotionally Abused: No    Physically Abused: No    Sexually Abused: No    Outpatient Medications Prior to Visit  Medication Sig Dispense Refill   albuterol (PROVENTIL) (2.5 MG/3ML) 0.083% nebulizer solution Take 3 mLs (2.5 mg total) by nebulization every 4 (four) hours as needed for wheezing or shortness of breath. 75 mL 2   albuterol (VENTOLIN HFA) 108 (90 Base) MCG/ACT inhaler Inhale 2 puffs into the lungs every 6 (six) hours as needed for wheezing or shortness of breath. 18 g 5   cetirizine (ZYRTEC) 10 MG tablet Take 10 mg by mouth daily as needed for allergies.     chlorhexidine (PERIDEX) 0.12 % solution Use as directed 10 mLs in the mouth or throat.     cholecalciferol (VITAMIN D3) 25 MCG (1000 UNIT) tablet Take 1,000 Units by mouth daily.     fluticasone (FLONASE) 50 MCG/ACT nasal spray Place 1 spray into both nostrils daily. Use for 4-6 weeks then stop and use seasonally or as needed. 16 g 0   hydrOXYzine (ATARAX) 10 MG tablet Take 1 tablet (10 mg total) by mouth 3 (three) times daily as needed for anxiety. 60 tablet 3   montelukast (SINGULAIR) 10 MG tablet Take 10 mg by mouth at bedtime as needed.     Multiple Vitamins-Minerals (ONE-A-DAY WOMENS PO) Take 1 tablet by mouth daily.     Spacer/Aero-Holding Chambers (AEROCHAMBER MV) inhaler Use as instructed (Patient not taking: Reported on 01/10/2024) 1 each 2   No facility-administered medications prior to visit.    ROS:  Review of Systems  Constitutional:  Negative for fatigue, fever and unexpected weight change.  Respiratory:  Negative for cough, shortness of breath and wheezing.   Cardiovascular:  Negative for chest pain, palpitations and leg swelling.  Gastrointestinal:  Negative for blood in stool, constipation, diarrhea, nausea and  vomiting.  Endocrine: Negative for cold intolerance, heat intolerance and polyuria.  Genitourinary:  Negative for dyspareunia, dysuria, flank pain, frequency, genital sores, hematuria, menstrual problem, pelvic pain, urgency, vaginal bleeding, vaginal discharge and vaginal pain.  Musculoskeletal:  Negative for back pain, joint swelling and myalgias.  Skin:  Negative for rash.  Neurological:  Negative for dizziness, syncope, light-headedness, numbness and headaches.  Hematological:  Negative for adenopathy.  Psychiatric/Behavioral:  Negative for agitation, confusion, sleep disturbance and suicidal ideas. The patient is not nervous/anxious.  BREAST: No symptoms   Objective: LMP 02/01/2024 (Exact Date)    Physical Exam Constitutional:      Appearance: She is well-developed.  Genitourinary:     Vulva normal.     Right Labia: No rash, tenderness or lesions.    Left Labia: No tenderness, lesions or rash.    No vaginal discharge, erythema or tenderness.      Right Adnexa: not tender and no mass present.    Left Adnexa: not tender and no mass present.    No cervical motion tenderness, friability or polyp.     Uterus is not enlarged or tender.  Breasts:    Right: No mass, nipple discharge, skin change or tenderness.     Left: No mass, nipple discharge, skin change or tenderness.  Neck:     Thyroid: No thyromegaly.  Cardiovascular:     Rate and Rhythm: Normal rate and regular rhythm.     Heart sounds: Normal heart sounds. No murmur heard. Pulmonary:     Effort: Pulmonary effort is normal.     Breath sounds: Normal breath sounds.  Abdominal:     Palpations: Abdomen is soft.     Tenderness: There is no abdominal tenderness. There is no guarding or rebound.  Musculoskeletal:        General: Normal range of motion.     Cervical back: Normal range of motion.  Lymphadenopathy:     Cervical: No cervical adenopathy.  Neurological:     General: No focal deficit present.     Mental  Status: She is alert and oriented to person, place, and time.     Cranial Nerves: No cranial nerve deficit.  Skin:    General: Skin is warm and dry.  Psychiatric:        Mood and Affect: Mood normal.        Behavior: Behavior normal.        Thought Content: Thought content normal.        Judgment: Judgment normal.  Vitals reviewed.     Assessment/Plan: Encounter for annual routine gynecological examination  Cervical cancer screening - Plan: Cytology - PAP  Screening for HPV (human papillomavirus) - Plan: Cytology - PAP  High grade squamous intraepithelial lesion (HGSIL), grade 3 CIN, on biopsy of cervix - Plan: Cytology - PAP, repeat pap today            GYN counsel adequate intake of calcium and vitamin D, diet and exercise     F/U  No follow-ups on file.  Airon Sahni B. Meribeth Vitug, PA-C 02/26/2024 4:38 PM

## 2024-02-26 NOTE — ED Triage Notes (Signed)
 Pt here for SOB for a while. Pt states she started having a cough but now she is having hot flashes and periods of not being able to breathe when she gets up. Pt states she has tried using her inhaler with no relief. Pt denies productive cough, fever, or chills but endorses muscle aches.

## 2024-02-27 ENCOUNTER — Other Ambulatory Visit (HOSPITAL_COMMUNITY)
Admission: RE | Admit: 2024-02-27 | Discharge: 2024-02-27 | Disposition: A | Discharge status: Home / Self Care | Source: Ambulatory Visit | Attending: Obstetrics and Gynecology | Admitting: Obstetrics and Gynecology

## 2024-02-27 ENCOUNTER — Encounter: Payer: Self-pay | Admitting: Obstetrics and Gynecology

## 2024-02-27 ENCOUNTER — Ambulatory Visit (INDEPENDENT_AMBULATORY_CARE_PROVIDER_SITE_OTHER): Payer: Medicaid Other | Admitting: Obstetrics and Gynecology

## 2024-02-27 VITALS — BP 108/73 | HR 86 | Ht 65.0 in | Wt 265.0 lb

## 2024-02-27 DIAGNOSIS — D069 Carcinoma in situ of cervix, unspecified: Secondary | ICD-10-CM

## 2024-02-27 DIAGNOSIS — Z113 Encounter for screening for infections with a predominantly sexual mode of transmission: Secondary | ICD-10-CM

## 2024-02-27 DIAGNOSIS — Z1151 Encounter for screening for human papillomavirus (HPV): Secondary | ICD-10-CM

## 2024-02-27 DIAGNOSIS — Z01419 Encounter for gynecological examination (general) (routine) without abnormal findings: Secondary | ICD-10-CM | POA: Diagnosis not present

## 2024-02-27 DIAGNOSIS — Z124 Encounter for screening for malignant neoplasm of cervix: Secondary | ICD-10-CM

## 2024-02-27 NOTE — Patient Instructions (Signed)
 I value your feedback and you entrusting [place] with your care. If you get a [place] patient survey, I would appreciate you taking the time to let us know about your experience today. Thank you! ? ? ?

## 2024-02-28 LAB — RPR: RPR Ser Ql: NONREACTIVE

## 2024-02-28 LAB — HEPATITIS C ANTIBODY: Hep C Virus Ab: NONREACTIVE

## 2024-02-28 LAB — HIV ANTIBODY (ROUTINE TESTING W REFLEX): HIV Screen 4th Generation wRfx: NONREACTIVE

## 2024-02-29 LAB — CYTOLOGY - PAP
Chlamydia: NEGATIVE
Comment: NEGATIVE
Comment: NEGATIVE
Comment: NEGATIVE
Comment: NORMAL
Diagnosis: NEGATIVE
High risk HPV: NEGATIVE
Neisseria Gonorrhea: NEGATIVE
Trichomonas: NEGATIVE

## 2024-03-14 ENCOUNTER — Other Ambulatory Visit: Payer: Self-pay

## 2024-03-14 ENCOUNTER — Ambulatory Visit: Payer: Medicaid Other | Admitting: Obstetrics and Gynecology

## 2024-03-14 NOTE — Patient Outreach (Incomplete Revision)
 Complex Care Management   Visit Note  03/14/2024  Name:  Jasmine Gordon MRN: [id number] DOB: [birth year]  Situation: Referral received for Complex Care Management related to  Asthma, HLD  I obtained verbal consent from Patient.  Visit completed with Jasmine Gordon via telephone.    Background:   Past Medical History:  Diagnosis Date   Allergy    Anemia    Anxiety    Asthma    HPV (human papilloma virus) infection    Migraine    Obese    Panic attack    PTSD (post-traumatic stress disorder) 2016      Assessment: Patient Reported Symptoms: Cognitive Cognitive Status: Alert and oriented to person, place, and time, Normal speech and language skills Cognitive/Intellectual Conditions Management [RPT]: None reported or documented in medical history or problem list   Health Maintenance Behaviors: Annual physical exam, Exercise, Stress management Healing Pattern: Average Health Facilitated by: Stress management, Rest  Neurological Neurological Review of Symptoms: No symptoms reported Neurological Management Strategies: Routine screening Neurological Comment: No Symptoms Reported  HEENT HEENT Symptoms Reported: No symptoms reported HEENT Management Strategies: Routine screening HEENT Comment: No Symptoms Reported    Cardiovascular Cardiovascular Symptoms Reported: No symptoms reported Does patient have uncontrolled Hypertension?: No Cardiovascular Conditions: High blood cholesterol Cardiovascular Management Strategies: Routine screening, Exercise, Medication therapy Cardiovascular Self-Management Outcome: 4 (good)  Respiratory Respiratory Symptoms Reported: No symptoms reported Additional Respiratory Details: Evaluated in the Emergency Department on February 26, 2024 for shortness of breath. Reports doing well since being evaluated. Respiratory Conditions: Asthma, Seasonal allergies (History of Latent TB) Respiratory Self-Management Outcome: 4 (good)  Endocrine Patient reports the  following symptoms related to hypoglycemia or hyperglycemia : No symptoms reported Is patient diabetic?: No Endocrine Conditions: Vitamin D  deficiency Endocrine Management Strategies: Routine screening  Gastrointestinal Gastrointestinal Symptoms Reported: Obesity Gastrointestinal Management Strategies: Exercise, Activity, Diet modification Gastrointestinal Comment: Patient reports engaging in regular exercise/walking and diet for weight control. She was unable to tolerate Wygovy due to side effects. Nutrition Risk Screen (CP): No indicators present  Genitourinary Genitourinary Symptoms Reported: No symptoms reported Genitourinary Comment: No Symptoms Reported  Integumentary Integumentary Symptoms Reported: No symptoms reported Skin Conditions: Dermatitis Skin Management Strategies: Routine screening, Medication therapy Skin Self-Management Outcome: 4 (good)  Musculoskeletal Musculoskelatal Symptoms Reviewed: No symptoms reported Musculoskeletal Management Strategies: Routine screening Falls in the past year?: No Number of falls in past year: 1 or less Was there an injury with Fall?: No Fall Risk Category Calculator: 0 Patient Fall Risk Level: Low Fall Risk Patient at Risk for Falls Due to: Medication side effect Fall risk Follow up: Falls prevention discussed  Psychosocial Psychosocial Symptoms Reported: No symptoms reported Behavioral Health Conditions: Anxiety, Post traumatic stress, Depression Behavioral Management Strategies: Medication therapy, Support system, Exercise, Coping strategies (Reports plan to reestablish outreach with a counselor) Behavioral Health Self-Management Outcome: 4 (good) Behavioral Health Comment: Reports currently managing well. She plans to establish care with a new counselor. Major Change/Loss/Stressor/Fears (CP): Denies Techniques to Cope with Loss/Stress/Change: Exercise, Medication Quality of Family Relationships: supportive Do you feel physically  threatened by others?: No      03/14/2024    2:52 PM  Depression screen PHQ 2/9  Decreased Interest 0  Down, Depressed, Hopeless 0  PHQ - 2 Score 0    Vitals:    Medications Reviewed Today     Reviewed by Roxie Cord, RN (Registered Nurse) on 03/14/24 at 1450  Med List Status: <None>   Medication Order Taking? Sig Documenting  Provider Last Dose Status Informant  albuterol  (PROVENTIL ) (2.5 MG/3ML) 0.083% nebulizer solution [id number] No Take 3 mLs (2.5 mg total) by nebulization every 4 (four) hours as needed for wheezing or shortness of breath. Bradler, Evan K, MD Taking Expired 12/29/23 2359   albuterol  (VENTOLIN  HFA) 108 (90 Base) MCG/ACT inhaler [id number] No Inhale 2 puffs into the lungs every 6 (six) hours as needed for wheezing or shortness of breath. Raina Bunting, DO Taking Active   cetirizine  (ZYRTEC ) 10 MG tablet [id number] No Take 10 mg by mouth daily as needed for allergies. [provider] Taking Active   chlorhexidine (PERIDEX) 0.12 % solution [id number] No Use as directed 10 mLs in the mouth or throat. [provider] Taking Active   cholecalciferol (VITAMIN D3) 25 MCG (1000 UNIT) tablet [id number] No Take 1,000 Units by mouth daily. [provider] Taking Active   fluticasone  (FLONASE ) 50 MCG/ACT nasal spray [id number] No Place 1 spray into both nostrils daily. Use for 4-6 weeks then stop and use seasonally or as needed. Carollynn Cirri, NP Taking Active   hydrOXYzine  (ATARAX ) 10 MG tablet [id number] No Take 1 tablet (10 mg total) by mouth 3 (three) times daily as needed for anxiety. Raina Bunting, DO Taking Active   montelukast  (SINGULAIR ) 10 MG tablet [id number] No Take 10 mg by mouth at bedtime as needed. [provider] Taking Active   Multiple Vitamins-Minerals (ONE-A-DAY WOMENS PO) [id number] No Take 1 tablet by mouth daily. [provider] Taking Active   Spacer/Aero-Holding Chambers (AEROCHAMBER MV)  inhaler [id number] No Use as instructed Raina Bunting, DO Taking Active             Recommendation:   PCP Follow-up as scheduled on May 09, 2024  Follow Up Plan:   Telephone follow up appointment on May 19, 2024 at 4 pm.   Roxie Cord [facility] RN Care Manager Direct Dial: [phone]  Fax: [phone] Website: [email or web address]

## 2024-03-14 NOTE — Patient Outreach (Addendum)
 Complex Care Management   Visit Note  03/14/2024  Name:  Jasmine Gordon MRN: [id number] DOB: [birth year]  Situation: Referral received for Complex Care Management related to  Asthma, HLD  I obtained verbal consent from Patient.  Visit completed with [name] via telephone.    Background:   Past Medical History:  Diagnosis Date   Allergy    Anemia    Anxiety    Asthma    HPV (human papilloma virus) infection    Migraine    Obese    Panic attack    PTSD (post-traumatic stress disorder) 2016      Assessment: Patient Reported Symptoms: Cognitive Cognitive Status: Alert and oriented to person, place, and time, Normal speech and language skills Cognitive/Intellectual Conditions Management [RPT]: None reported or documented in medical history or problem list   Health Maintenance Behaviors: Annual physical exam, Exercise, Stress management Healing Pattern: Average Health Facilitated by: Stress management, Rest  Neurological Neurological Review of Symptoms: No symptoms reported Neurological Management Strategies: Routine screening Neurological Comment: No Symptoms Reported  HEENT HEENT Symptoms Reported: No symptoms reported HEENT Management Strategies: Routine screening HEENT Comment: No Symptoms Reported    Cardiovascular Cardiovascular Symptoms Reported: No symptoms reported Does patient have uncontrolled Hypertension?: No Cardiovascular Conditions: High blood cholesterol Cardiovascular Management Strategies: Routine screening, Exercise, Medication therapy Cardiovascular Self-Management Outcome: 4 (good)  Respiratory Respiratory Symptoms Reported: No symptoms reported Additional Respiratory Details: Evaluated in the Emergency Department on February 26, 2024 for shortness of breath. Reports doing well since being evaluated. Respiratory Conditions: Asthma, Seasonal allergies (History of Latent TB) Respiratory Self-Management Outcome: 4 (good)  Endocrine Patient reports the  following symptoms related to hypoglycemia or hyperglycemia : No symptoms reported Is patient diabetic?: No Endocrine Conditions: Vitamin D  deficiency Endocrine Management Strategies: Routine screening  Gastrointestinal Gastrointestinal Symptoms Reported: Obesity Gastrointestinal Management Strategies: Exercise, Activity, Diet modification Gastrointestinal Comment: Patient reports engaging in regular exercise/walking and diet for weight control. She was unable to tolerate Wygovy due to side effects. Nutrition Risk Screen (CP): No indicators present  Genitourinary Genitourinary Symptoms Reported: No symptoms reported Genitourinary Comment: No Symptoms Reported  Integumentary Integumentary Symptoms Reported: No symptoms reported Skin Conditions: Dermatitis Skin Management Strategies: Routine screening, Medication therapy Skin Self-Management Outcome: 4 (good)  Musculoskeletal Musculoskelatal Symptoms Reviewed: No symptoms reported Musculoskeletal Management Strategies: Routine screening Falls in the past year?: No Number of falls in past year: 1 or less Was there an injury with Fall?: No Fall Risk Category Calculator: 0 Patient Fall Risk Level: Low Fall Risk Patient at Risk for Falls Due to: Medication side effect Fall risk Follow up: Falls prevention discussed  Psychosocial Psychosocial Symptoms Reported: No symptoms reported Behavioral Health Conditions: Anxiety, Post traumatic stress, Depression Behavioral Management Strategies: Medication therapy, Support system, Exercise, Coping strategies (Reports plan to reestablish outreach with a counselor) Behavioral Health Self-Management Outcome: 4 (good) Behavioral Health Comment: Reports currently managing well. She plans to establish care with a new counselor. Major Change/Loss/Stressor/Fears (CP): Denies Techniques to Cope with Loss/Stress/Change: Exercise, Medication Quality of Family Relationships: supportive Do you feel physically  threatened by others?: No      03/14/2024    2:52 PM  Depression screen PHQ 2/9  Decreased Interest 0  Down, Depressed, Hopeless 0  PHQ - 2 Score 0    Vitals:    Medications Reviewed Today     Reviewed by Roxie Cord, RN (Registered Nurse) on 03/14/24 at 1450  Med List Status: <None>   Medication Order Taking? Sig Documenting  Provider Last Dose Status Informant  albuterol  (PROVENTIL ) (2.5 MG/3ML) 0.083% nebulizer solution [id number] No Take 3 mLs (2.5 mg total) by nebulization every 4 (four) hours as needed for wheezing or shortness of breath. Bradler, Evan K, MD Taking Expired 12/29/23 2359   albuterol  (VENTOLIN  HFA) 108 (90 Base) MCG/ACT inhaler [id number] No Inhale 2 puffs into the lungs every 6 (six) hours as needed for wheezing or shortness of breath. Raina Bunting, DO Taking Active   cetirizine  (ZYRTEC ) 10 MG tablet [id number] No Take 10 mg by mouth daily as needed for allergies. [provider] Taking Active   chlorhexidine (PERIDEX) 0.12 % solution [id number] No Use as directed 10 mLs in the mouth or throat. [provider] Taking Active   cholecalciferol (VITAMIN D3) 25 MCG (1000 UNIT) tablet [id number] No Take 1,000 Units by mouth daily. [provider] Taking Active   fluticasone  (FLONASE ) 50 MCG/ACT nasal spray [id number] No Place 1 spray into both nostrils daily. Use for 4-6 weeks then stop and use seasonally or as needed. Carollynn Cirri, NP Taking Active   hydrOXYzine  (ATARAX ) 10 MG tablet [id number] No Take 1 tablet (10 mg total) by mouth 3 (three) times daily as needed for anxiety. Raina Bunting, DO Taking Active   montelukast  (SINGULAIR ) 10 MG tablet [id number] No Take 10 mg by mouth at bedtime as needed. [provider] Taking Active   Multiple Vitamins-Minerals (ONE-A-DAY WOMENS PO) [id number] No Take 1 tablet by mouth daily. [provider] Taking Active   Spacer/Aero-Holding Chambers (AEROCHAMBER MV)  inhaler [id number] No Use as instructed Raina Bunting, DO Taking Active             Recommendation:   PCP Follow-up as scheduled on May 09, 2024  Follow Up Plan:   Telephone follow up appointment on May 19, 2024 at 4 pm.   Roxie Cord [facility] RN Care Manager Direct Dial: [phone]  Fax: [phone] Website: [email or web address]

## 2024-03-14 NOTE — Patient Instructions (Signed)
 Visit Information Thank you for allowing the Care Management team to participate in your care. It was great speaking with you today.  Keep up the great work with exercising and managing your health! I hope you do well on your upcoming test.  Reminders: -Please be sure to complete your PCP appointment on May 09, 2024. -We will follow up via telephone on May 19, 2024 at 4 pm. Please let me know if we need to reschedule due to your work schedule.  Please do not hesitate to contact me with questions.   Roxie Cord [facility] RN Care Manager Direct Dial: [phone]  Fax: [phone] Website: [email or web address]    [name] was given information about Medicaid Managed Care team care coordination services as a part of their Healthy [facility] Medicaid benefit. [name] verbally consented to engagement with the [facility] Managed Care team.   If you are experiencing a medical emergency, please call 911 or report to your local emergency department or urgent care.   If you have a non-emergency medical problem during routine business hours, please contact your provider's office and ask to speak with a nurse.   For questions related to your Healthy [facility] plan, please call: [phone] or visit the homepage here: [email or web address]  If you would like to schedule transportation through your Healthy [facility] plan, please call the following number at least 2 days in advance of your appointment: [phone]  For information about your ride after you set it up, call Ride Assist at [phone]. Use this number to activate a Will Call pickup, or if your transportation is late for a scheduled pickup. Use this number, too, if you need to make a change or cancel a previously scheduled reservation.  If you need transportation services right away, call [phone]. The after-hours call center is staffed 24 hours  to handle ride assistance and urgent reservation requests (including discharges) 365 days a year. Urgent trips include sick visits, hospital discharge requests and life-sustaining treatment.  Call the [facility] Line at [phone], at any time, 24 hours a day, 7 days a week. If you are in danger or need immediate medical attention call 911.  If you would like help to quit smoking, call 1-800-QUIT-NOW (406-660-1965) OR Espaol: 1-855-Djelo-Ya (9-381-017-5102) o para ms informacin haga clic aqu or Text READY to 585-277 to register via text  [name] following are the goals we discussed in your visit today:   Goals Addressed             This Visit's Progress    VBCI RNCM:  Monitor, Self-Manage And Reduce Symptoms of Asthma       Problems:  Chronic Disease Management support and education needs related to Asthma   Goal: Over the next 3 months the patient will: Attend all scheduled medical appointments Take all medications exactly as prescribed  Demonstrate ongoing adherence to prescribed treatment plan for Asthma Not experience hospital admission for complications related to Asthma  Interventions:  Asthma:(Status: Goal on track  ) Long Term Goal  Reviewed plan for management of Asthma. Reviewed medications. Reports taking medications and using inhalers as prescribed. She was evaluated in the Emergency Room on February 26, 2024 and treated for shortness of breath. She feels symptoms were likely exacerbated due to increased pollen. Reports doing well since being treated and discharged. Reports she has been able to complete 2-to-3-mile morning walks without complications. She is aware of need to always keep inhaler  with her.  Advised to continue taking needed precautions to prevent exacerbation.  Reviewed current action plan and reinforced importance of daily self-assessment. Advised to make an appointment with provider if experiencing moderate symptoms for  greater than 48 hours without improvement.  Provided information regarding infection prevention and increased risk related to Asthma. Advised to utilize prevention strategies to reduce risk of respiratory infection  Reviewed worsening symptoms that require immediate medical attention.    Patient Self-Care Activities:  Complete appointments with the medical team as scheduled Take medications and use inhalers as prescribed Assess symptoms daily Notify provider if in the yellow zone for 48 hrs without improvement Follow recommendations to prevent respiratory infection Notify provider or care management team with questions and new concerns as needed  Plan:  Telephone follow up appointment with care management team member scheduled for: May 19, 2024          VBCI RNCM:  Monitor, Self-Manage And Reduce Symptoms of HLD       Problems:  Chronic Disease Management support and education needs related to Hyperlipidemia  Goal: Over the next 3 months the patient will: Attend all scheduled medical appointments Demonstrate ongoing adherence to prescribed treatment plan for HLD  Interventions:  Hyperlipidemia Interventions:(Status: Goal on track:  Yes.) Long Term Goal  Reviewed established cholesterol goals  Discussed importance of regular laboratory monitoring as prescribed Provided HLD educational materials Reviewed importance of limiting foods high in cholesterol Reviewed exercise goals. Reports walking approximately 2 to 3 miles in the morning. Notes this has improved her mood. weight and overall health.  Screening for signs and symptoms of depression related to chronic disease state  Assessed social determinant of health barriers Reviewed signs and symptoms of heart attack, stroke and indications for seeking immediate medical attention.  Lab Results  Component Value Date   CHOL 220 (H) 09/25/2023   HDL 46 (L) 09/25/2023   LDLCALC 155 (H) 09/25/2023   TRIG 87 09/25/2023   CHOLHDL  4.8 09/25/2023     Patient Self-Care Activities:  Complete labs as ordered Attend all scheduled provider appointments Continue consistent exercise Read nutrition labels. Eat whole grains, fruits and vegetables, lean meats and healthy fats Limit intake of sodium and cholesterol Call provider office for new concerns or questions   Plan:  Telephone follow up appointment with care management team member scheduled for:  May 19, 2024

## 2024-03-15 ENCOUNTER — Encounter: Payer: Self-pay | Admitting: Family Medicine

## 2024-03-15 ENCOUNTER — Other Ambulatory Visit: Payer: Self-pay | Admitting: Internal Medicine

## 2024-03-15 DIAGNOSIS — J3089 Other allergic rhinitis: Secondary | ICD-10-CM

## 2024-03-18 ENCOUNTER — Other Ambulatory Visit: Payer: Self-pay

## 2024-03-18 DIAGNOSIS — J4530 Mild persistent asthma, uncomplicated: Secondary | ICD-10-CM

## 2024-03-18 DIAGNOSIS — G4733 Obstructive sleep apnea (adult) (pediatric): Secondary | ICD-10-CM | POA: Diagnosis not present

## 2024-03-18 MED ORDER — ALBUTEROL SULFATE HFA 108 (90 BASE) MCG/ACT IN AERS: 2.0000 | INHALATION_SPRAY | Freq: Four times a day (QID) | RESPIRATORY_TRACT | 5 refills | Status: DC | PRN

## 2024-03-18 MED ORDER — ALBUTEROL SULFATE (2.5 MG/3ML) 0.083% IN NEBU: 2.5000 mg | INHALATION_SOLUTION | RESPIRATORY_TRACT | 2 refills | Status: AC | PRN

## 2024-03-18 MED ORDER — FLUTICASONE PROPIONATE 50 MCG/ACT NA SUSP: 1.0000 | Freq: Every day | NASAL | 0 refills | Status: AC

## 2024-04-18 ENCOUNTER — Other Ambulatory Visit: Payer: Self-pay

## 2024-04-18 NOTE — Patient Instructions (Incomplete)
 Visit Information  [name] was given information about Medicaid Managed Care team care coordination services as a part of their Healthy [facility] Medicaid benefit. Jasmine Gordon {MMPTINSTRXCONSENTCHOICES:24940} to engagement with the [facility] Managed Care team.   If you are experiencing a medical emergency, please call 911 or report to your local emergency department or urgent care.   If you have a non-emergency medical problem during routine business hours, please contact your provider's office and ask to speak with a nurse.   For questions related to your Healthy [facility] health plan, please call: [phone] or visit the homepage here: [email or web address]  If you would like to schedule transportation through your Healthy [facility] plan, please call the following number at least 2 days in advance of your appointment: [phone]  For information about your ride after you set it up, call Ride Assist at [phone]. Use this number to activate a Will Call pickup, or if your transportation is late for a scheduled pickup. Use this number, too, if you need to make a change or cancel a previously scheduled reservation.  If you need transportation services right away, call [phone]. The after-hours call center is staffed 24 hours to handle ride assistance and urgent reservation requests (including discharges) 365 days a year. Urgent trips include sick visits, hospital discharge requests and life-sustaining treatment.  Call the [facility] Line at [phone], at any time, 24 hours a day, 7 days a week. If you are in danger or need immediate medical attention call 911.  If you would like help to quit smoking, call 1-800-QUIT-NOW (217 738 3252) OR Espaol: 1-855-Djelo-Ya (0-254-270-6237) o para ms informacin haga clic aqu or Text READY to 628-315 to register via text  [name] following are the goals we discussed  in your visit today:   Goals Addressed             This Visit's Progress    VBCI RN Care Plan       VBCI RNCM:  Monitor, Self-Manage And Reduce Symptoms of Asthma       Problems:  Chronic Disease Management support and education needs related to Asthma   Goal: Over the next 3 months the patient will: Attend all scheduled medical appointments Take all medications exactly as prescribed  Demonstrate ongoing adherence to prescribed treatment plan for Asthma Not experience hospital admission for complications related to Asthma  Interventions:  Asthma:(Status: Goal on track  ) Long Term Goal  Reviewed plan for management of Asthma. Reviewed medications. Reports taking medications and using inhalers as prescribed. She was evaluated in the Emergency Room on February 26, 2024 and treated for shortness of breath. She feels symptoms were likely exacerbated due to increased pollen. Reports doing well since being treated and discharged. Reports she has been able to complete 2-to-3-mile morning walks without complications. She is aware of need to always keep inhaler with her.  Advised to continue taking needed precautions to prevent exacerbation.  Reviewed current action plan and reinforced importance of daily self-assessment. Advised to make an appointment with provider if experiencing moderate symptoms for greater than 48 hours without improvement.  Provided information regarding infection prevention and increased risk related to Asthma. Advised to utilize prevention strategies to reduce risk of respiratory infection  Reviewed worsening symptoms that require immediate medical attention.    Patient Self-Care Activities:  Complete appointments with the medical team as scheduled Take medications and use inhalers as prescribed Assess symptoms daily Notify provider if in the yellow  zone for 48 hrs without improvement Follow recommendations to prevent respiratory infection Notify provider or care  management team with questions and new concerns as needed  Plan:  Telephone follow up appointment          VBCI RNCM:  Monitor, Self-Manage And Reduce Symptoms of HLD       Problems:  Chronic Disease Management support and education needs related to Hyperlipidemia  Goal: Over the next 3 months the patient will: Attend all scheduled medical appointments Demonstrate ongoing adherence to prescribed treatment plan for HLD  Interventions:  Hyperlipidemia Interventions:(Status: Goal on track:  Yes.) Long Term Goal  Reviewed established cholesterol goals  Discussed importance of regular laboratory monitoring as prescribed Provided HLD educational materials Reviewed importance of limiting foods high in cholesterol Reviewed exercise goals. Reports walking approximately 2 to 3 miles in the morning. Notes this has improved her mood. weight and overall health.  Screening for signs and symptoms of depression related to chronic disease state  Assessed social determinant of health barriers Reviewed signs and symptoms of heart attack, stroke and indications for seeking immediate medical attention.  Lab Results  Component Value Date   CHOL 220 (H) 09/25/2023   HDL 46 (L) 09/25/2023   LDLCALC 155 (H) 09/25/2023   TRIG 87 09/25/2023   CHOLHDL 4.8 09/25/2023     Patient Self-Care Activities:  Complete labs as ordered Attend all scheduled provider appointments Continue consistent exercise Read nutrition labels. Eat whole grains, fruits and vegetables, lean meats and healthy fats Limit intake of sodium and cholesterol Call provider office for new concerns or questions   Plan:  Telephone follow up appointment with             Please see education materials related to *** provided {MMEDCHOICES:24216}  {CCM PT INSTRUCTIONS:22237}  {MM FOLLOW UP WUJW:11914}  SIG***  Following is a copy of your plan of care:  There are no care plans that you recently modified to display for this  patient.

## 2024-04-18 NOTE — Patient Outreach (Unsigned)
 Complex Care Management   Visit Note  04/18/2024  Name:  Jasmine Gordon MRN: [id number] DOB: Jan 16, 1986  Situation: Referral received for Complex Care Management related to Asthma, HLD and Anxiety. I obtained verbal consent from Patient.  Visit completed with Jasmine Gordon via telephone.  Background:   Past Medical History:  Diagnosis Date   Allergy    Anemia    Anxiety    Asthma    HPV (human papilloma virus) infection    Migraine    Obese    Panic attack    PTSD (post-traumatic stress disorder) 2016       Assessment: Patient Reported Symptoms:  Cognitive Cognitive Status: Alert and oriented to person, place, and time, Normal speech and language skills Cognitive/Intellectual Conditions Management [RPT]: None reported or documented in medical history or problem list  Neurological Neurological Review of Symptoms: No symptoms reported  HEENT HEENT Symptoms Reported: No symptoms reported  Cardiovascular Cardiovascular Symptoms Reported: No symptoms reported Does patient have uncontrolled Hypertension?: No  Respiratory Respiratory Symptoms Reported: No symptoms reported  Endocrine Patient reports the following symptoms related to hypoglycemia or hyperglycemia : No symptoms reported Is patient diabetic?: No  Gastrointestinal Additional Gastrointestinal Details: Chronic Obesity. Reports she was unable to tolerate Ozempic  Genitourinary Genitourinary Symptoms Reported: No symptoms reported  Integumentary Integumentary Symptoms Reported: No symptoms reported  Musculoskeletal Musculoskelatal Symptoms Reviewed: No symptoms reported  Psychosocial Psychosocial Symptoms Reported: Anxiety - if selected complete GAD Behavioral Health Conditions: Anxiety, Depression, Post traumatic stress Behavioral Management Strategies: Medication therapy, Exercise, Coping strategies Behavioral Health Self-Management Outcome: 3 (uncertain) Behavioral Health Comment: Reports increased anxiety due to  multiple responsibilities and lack of support from her spouse. She is agreeable to outreach with the LCSW to discuss options for counseling. Major Change/Loss/Stressor/Fears (CP): Medical condition, self (Lack of support from spouse) Techniques to Cope with Loss/Stress/Change: Exercise, Medication Quality of Family Relationships:  (Reports support from friends. Reports not receiving support from spouse.) Do you feel physically threatened by others?: No      04/18/2024    4:30 PM  Depression screen PHQ 2/9  Decreased Interest 0  Down, Depressed, Hopeless 3  PHQ - 2 Score 3  Altered sleeping 0  Tired, decreased energy 1  Change in appetite 0  Feeling bad or failure about yourself  1  Trouble concentrating 1  Moving slowly or fidgety/restless 0  Suicidal thoughts 0  PHQ-9 Score 6  Difficult doing work/chores Somewhat difficult    There were no vitals filed for this visit.  Medications Reviewed Today     Reviewed by Roxie Cord, RN (Registered Nurse) on 04/18/24 at 1610  Med List Status: <None>   Medication Order Taking? Sig Documenting Provider Last Dose Status Informant  albuterol  (PROVENTIL ) (2.5 MG/3ML) 0.083% nebulizer solution [id number]  Take 3 mLs (2.5 mg total) by nebulization every 4 (four) hours as needed for wheezing or shortness of breath. Karamalegos, Kayleen Party, DO  Active   albuterol  (VENTOLIN  HFA) 108 (90 Base) MCG/ACT inhaler [id number]  Inhale 2 puffs into the lungs every 6 (six) hours as needed for wheezing or shortness of breath. Raina Bunting, DO  Active   cetirizine  (ZYRTEC ) 10 MG tablet [id number] No Take 10 mg by mouth daily as needed for allergies. [provider] Taking Active   chlorhexidine (PERIDEX) 0.12 % solution [id number] No Use as directed 10 mLs in the mouth or throat. [provider] Taking Active   cholecalciferol (VITAMIN D3) 25 MCG (1000 UNIT) tablet [id number]  No Take 1,000 Units by mouth daily. [provider] Taking Active   fluticasone  (FLONASE ) 50 MCG/ACT nasal spray [id number]  Place 1 spray into both nostrils daily. Use for 4-6 weeks then stop and use seasonally or as needed. Raina Bunting, DO  Active   hydrOXYzine  (ATARAX ) 10 MG tablet [id number] No Take 1 tablet (10 mg total) by mouth 3 (three) times daily as needed for anxiety. Raina Bunting, DO Taking Active   montelukast  (SINGULAIR ) 10 MG tablet [id number] No Take 10 mg by mouth at bedtime as needed. [provider] Taking Active   Multiple Vitamins-Minerals (ONE-A-DAY WOMENS PO) [id number] No Take 1 tablet by mouth daily. [provider] Taking Active   Spacer/Aero-Holding Chambers (AEROCHAMBER MV) inhaler [id number] No Use as instructed Raina Bunting, DO Taking Active             Recommendation:   PCP Follow-up on May 09, 2024  Follow Up Plan:   Telephone follow up appointment with Nurse Case Manager on April 22, 2024   Roxie Cord [facility] RN Care Manager Direct Dial: [phone]  Fax: [phone] Website: [email or web address]

## 2024-04-22 ENCOUNTER — Other Ambulatory Visit: Payer: Self-pay

## 2024-04-22 NOTE — Patient Instructions (Incomplete)
 Visit Information  [name] was given information about Medicaid Managed Care team care coordination services as a part of their Healthy [facility] Medicaid benefit. Nira Maliea Grandmaison {MMPTINSTRXCONSENTCHOICES:24940} to engagement with the [facility] Managed Care team.   If you are experiencing a medical emergency, please call 911 or report to your local emergency department or urgent care.   If you have a non-emergency medical problem during routine business hours, please contact your provider's office and ask to speak with a nurse.   For questions related to your Healthy [facility] health plan, please call: [phone] or visit the homepage here: [email or web address]  If you would like to schedule transportation through your Healthy [facility] plan, please call the following number at least 2 days in advance of your appointment: [phone]  For information about your ride after you set it up, call Ride Assist at [phone]. Use this number to activate a Will Call pickup, or if your transportation is late for a scheduled pickup. Use this number, too, if you need to make a change or cancel a previously scheduled reservation.  If you need transportation services right away, call [phone]. The after-hours call center is staffed 24 hours to handle ride assistance and urgent reservation requests (including discharges) 365 days a year. Urgent trips include sick visits, hospital discharge requests and life-sustaining treatment.  Call the [facility] Line at [phone], at any time, 24 hours a day, 7 days a week. If you are in danger or need immediate medical attention call 911.  If you would like help to quit smoking, call 1-800-QUIT-NOW ((414)097-7564) OR Espaol: 1-855-Djelo-Ya (4-742-595-6387) o para ms informacin haga clic aqu or Text READY to 564-332 to register via text  [name] following are the goals we discussed  in your visit today: -   Please see education materials related to *** provided {MMEDCHOICES:24216}  {CCM PT INSTRUCTIONS:22237}  {MM FOLLOW UP RJJO:84166}  SIG***  Following is a copy of your plan of care:  There are no care plans that you recently modified to display for this patient.

## 2024-04-22 NOTE — Patient Outreach (Signed)
 Complex Care Management   Visit Note  04/22/2024  Name:  Jasmine Gordon MRN: [id number] DOB: 07/22/1986  Situation: Referral received for Complex Care Management related to Asthma and Anxiety,  I obtained verbal consent from Patient.  Visit completed with [name] via telephone today.  Background:   Past Medical History:  Diagnosis Date   Allergy    Anemia    Anxiety    Asthma    HPV (human papilloma virus) infection    Migraine    Obese    Panic attack    PTSD (post-traumatic stress disorder) 2016      Assessment: Patient Reported Symptoms:  Cognitive Cognitive Status: Alert and oriented to person, place, and time, Normal speech and language skills Cognitive/Intellectual Conditions Management [RPT]: None reported or documented in medical history or problem list Health Maintenance Behaviors: Annual physical exam, Spiritual practice(s), Stress management Healing Pattern: Average Health Facilitated by: Stress management, Rest  Neurological Neurological Review of Symptoms: No symptoms reported Neurological Management Strategies: Routine screening Neurological Comment: No Symptoms Reported  HEENT HEENT Symptoms Reported: No symptoms reported HEENT Management Strategies: Routine screening HEENT Comment: No Symptoms Reported  Cardiovascular Cardiovascular Symptoms Reported: No symptoms reported Does patient have uncontrolled Hypertension?: No Cardiovascular Conditions: High blood cholesterol Cardiovascular Management Strategies: Exercise, Medication therapy, Routine screening Cardiovascular Self-Management Outcome: 4 (good)  Respiratory Respiratory Symptoms Reported: No symptoms reported Respiratory Conditions: Asthma, Seasonal allergies Respiratory Self-Management Outcome: 4 (good)  Endocrine Patient reports the following symptoms related to hypoglycemia or hyperglycemia : No symptoms reported Is patient diabetic?: No Endocrine Conditions: Vitamin D   deficiency Endocrine Management Strategies: Routine screening Endocrine Self-Management Outcome: 4 (good)  Gastrointestinal Gastrointestinal Symptoms Reported: Obesity Additional Gastrointestinal Details: Chronic Obesity. Reports previously trying Ozempic or Wygovy but was unable to tolerate. Currently engages in exercises/walking. Gastrointestinal Management Strategies: Exercise, Diet modification Gastrointestinal Comment: Reports attempting to engage in regular exercise/walking and modifying diet for weight control. Reports previously attempting injectables/Ozempic and Wygovy but was unable to tolerate due to side effects. Willing to consider other options (possible dietician or prescribed exercise regimen) but will need to consider her work schedule and options for child care. Nutrition Risk Screen (CP): No indicators present  Genitourinary Genitourinary Symptoms Reported: No symptoms reported Genitourinary Comment: No symptoms Reported  Integumentary Integumentary Symptoms Reported: No symptoms reported Skin Conditions: Dermatitis Skin Management Strategies: Medication therapy, Routine screening Skin Self-Management Outcome: 4 (good)  Musculoskeletal Musculoskelatal Symptoms Reviewed: No symptoms reported Musculoskeletal Management Strategies: Routine screening Musculoskeletal Comment: No Symptoms Reported Falls in the past year?: No Number of falls in past year: 1 or less Was there an injury with Fall?: No Fall Risk Category Calculator: 0 Patient Fall Risk Level: Low Fall Risk Patient at Risk for Falls Due to: Medication side effect Fall risk Follow up: Falls prevention discussed  Psychosocial Psychosocial Symptoms Reported: Anxiety - if selected complete GAD Behavioral Health Conditions: Anxiety, Depression, Post traumatic stress Behavioral Management Strategies: Exercise, Coping strategies, Medication therapy Behavioral Health Self-Management Outcome: 3 (uncertain) Behavioral  Health Comment: Reports increased anxiety due to multiple responsibilities and lack of support from her spouse. She is requesting follow up outreach to discuss options for housing (Scheduled for follow up with BSW on June 4th). She is agreeable to outreach with LCSW. Prefers to follow up with her previous LCSW B. Lenon Radar. Appointment scheduled for June 10th. Major Change/Loss/Stressor/Fears (CP): Medical condition, self, Relationship concerns (Reports lack of emotional or financial support from spouse) Behaviors When Feeling Stressed/Fearful: Rest, Withdraw Techniques to  Cope with Loss/Stress/Change: Diversional activities, Medication, Exercise, Withdraw Quality of Family Relationships:  (Reports support from friends. Reports spouse does not provide emotional or financial support) Do you feel physically threatened by others?: No      04/22/2024    4:36 PM  Depression screen PHQ 2/9  Decreased Interest 1  Down, Depressed, Hopeless 3  PHQ - 2 Score 4  Altered sleeping 0  Tired, decreased energy 1  Change in appetite 0  Feeling bad or failure about yourself  1  Trouble concentrating 1  Moving slowly or fidgety/restless 0  Suicidal thoughts 0  PHQ-9 Score 7  Difficult doing work/chores Somewhat difficult    Vitals:    Medications Reviewed Today     Reviewed by Roxie Cord, RN (Registered Nurse) on 04/22/24 at 1605  Med List Status: <None>   Medication Order Taking? Sig Documenting Provider Last Dose Status Informant  albuterol  (PROVENTIL ) (2.5 MG/3ML) 0.083% nebulizer solution [id number]  Take 3 mLs (2.5 mg total) by nebulization every 4 (four) hours as needed for wheezing or shortness of breath. Karamalegos, Kayleen Party, DO  Active   albuterol  (VENTOLIN  HFA) 108 [phone] Base) MCG/ACT inhaler [id number]  Inhale 2 puffs into the lungs every 6 (six) hours as needed for wheezing or shortness of breath. Raina Bunting, DO  Active   cetirizine  (ZYRTEC ) 10 MG tablet [id number] No Take 10  mg by mouth daily as needed for allergies. [provider] Taking Active   chlorhexidine (PERIDEX) 0.12 % solution [id number] No Use as directed 10 mLs in the mouth or throat. [provider] Taking Active   cholecalciferol (VITAMIN D3) 25 MCG (1000 UNIT) tablet [id number] No Take 1,000 Units by mouth daily. [provider] Taking Active   fluticasone  (FLONASE ) 50 MCG/ACT nasal spray [id number]  Place 1 spray into both nostrils daily. Use for 4-6 weeks then stop and use seasonally or as needed. Raina Bunting, DO  Active   hydrOXYzine  (ATARAX ) 10 MG tablet [id number] No Take 1 tablet (10 mg total) by mouth 3 (three) times daily as needed for anxiety. Raina Bunting, DO Taking Active   montelukast  (SINGULAIR ) 10 MG tablet [id number] No Take 10 mg by mouth at bedtime as needed. [provider] Taking Active   Multiple Vitamins-Minerals (ONE-A-DAY WOMENS PO) [id number] No Take 1 tablet by mouth daily. [provider] Taking Active   Spacer/Aero-Holding Chambers (AEROCHAMBER MV) inhaler [id number] No Use as instructed Raina Bunting, DO Taking Active             Recommendation:   Follow up with BSW on April 24, 2024 Outreach with LCSW on April 30, 2024 PCP Follow-up on May 06, 2024  Follow Up Plan:   Telephone follow up with Nurse Case Manager on May 27, 2024   Roxie Cord [facility] RN Care Manager Direct Dial: [phone]  Fax: [phone] Website: [email or web address]

## 2024-04-24 ENCOUNTER — Other Ambulatory Visit: Payer: Self-pay

## 2024-04-24 NOTE — Patient Outreach (Signed)
 Complex Care Management   Visit Note  04/24/2024  Name:  Jasmine Gordon MRN: [id number] DOB: 1986/10/24  Situation: Referral received for Complex Care Management related to SDOH Barriers:  Housing move Lack of essential utilities deposit I obtained verbal consent from Patient.  Visit completed with patient  on the phone  Background:   Past Medical History:  Diagnosis Date   Allergy    Anemia    Anxiety    Asthma    HPV (human papilloma virus) infection    Migraine    Obese    Panic attack    PTSD (post-traumatic stress disorder) 2016    Assessment: SW completed a telephone outreach with patient, she has found an apartment but has some paperwork that needs to be completed first. Patient states she just started working and may need assistance with the deposits. SW and patient agreed for resources to be emailed to address on file.  SDOH Interventions    Flowsheet Row Patient Outreach Telephone from 04/22/2024 in [place] POPULATION HEALTH DEPARTMENT Patient Outreach Telephone from 04/18/2024 in [place] POPULATION HEALTH DEPARTMENT Patient Outreach Telephone from 03/14/2024 in [place] POPULATION HEALTH DEPARTMENT Patient Outreach Telephone from 01/16/2024 in [place] POPULATION HEALTH DEPARTMENT Patient Outreach Telephone from 01/15/2024 in [place] POPULATION HEALTH DEPARTMENT Patient Outreach Telephone from 12/08/2023 in [place] POPULATION HEALTH DEPARTMENT  SDOH Interventions        Food Insecurity Interventions -- -- Intervention Not Indicated -- -- --  Housing Interventions -- -- Intervention Not Indicated -- -- --  Transportation Interventions -- -- Intervention Not Indicated -- -- --  Utilities Interventions -- -- Intervention Not Indicated -- -- Intervention Not Indicated  Alcohol Usage Interventions -- -- Intervention Not Indicated (Score <7) -- Intervention Not Indicated (Score <7) --  Depression Interventions/Treatment  Medication  [Pending outreach with  LCSW on April 30, 2024. Agreeable to establishing care with local counselor. Collaborated with PCP. Will discuss options for medications for better symptom management during her appointment on May 06, 2024.] Medication -- -- -- --  Financial Strain Interventions -- -- Other (Comment)  [Declined current need for resources] -- -- --  Physical Activity Interventions -- -- Intervention Not Indicated  [Reports exercising/walking 2 to 3 miles in the mornings] -- -- --  Stress Interventions -- -- Intervention Not Indicated  [Reports doing well since starting current morning routine. Reports walking several mornings a week in her local park. Notes significant improvement with stress and overall health.] [employer] Provided, Provide Counseling -- --  Social Connections Interventions -- -- Intervention Not Indicated -- -- --  Health Literacy Interventions -- -- Intervention Not Indicated -- -- Intervention Not Indicated         Recommendation:   No recommendations at this time  Follow Up Plan:   Telephone follow-up 05/08/24 at 3:30  Valora Gear, BSW, MHA [place]  [facility] Social Worker, Population Health [phone]

## 2024-04-24 NOTE — Patient Instructions (Signed)
 Visit Information  [name] was given information about Medicaid Managed Care team care coordination services as a part of their Healthy [facility] Medicaid benefit. Jasmine Gordon verbally consented to engagement with the [facility] Managed Care team.   If you are experiencing a medical emergency, please call 911 or report to your local emergency department or urgent care.   If you have a non-emergency medical problem during routine business hours, please contact your provider's office and ask to speak with a nurse.   For questions related to your Healthy [facility] health plan, please call: [phone] or visit the homepage here: [email or web address]  If you would like to schedule transportation through your Healthy [facility] plan, please call the following number at least 2 days in advance of your appointment: [phone]  For information about your ride after you set it up, call Ride Assist at [phone]. Use this number to activate a Will Call pickup, or if your transportation is late for a scheduled pickup. Use this number, too, if you need to make a change or cancel a previously scheduled reservation.  If you need transportation services right away, call [phone]. The after-hours call center is staffed 24 hours to handle ride assistance and urgent reservation requests (including discharges) 365 days a year. Urgent trips include sick visits, hospital discharge requests and life-sustaining treatment.  Call the [place] Line at [phone], at any time, 24 hours a day, 7 days a week. If you are in danger or need immediate medical attention call 911.  If you would like help to quit smoking, call 1-800-QUIT-NOW (571 401 6949) OR Espaol: 1-855-Djelo-Ya (4-742-595-6387) o para ms informacin haga clic aqu or Text READY to 564-332 to register via text  [name] following are the goals we discussed in your visit  today:   Goals Addressed             This Visit's Progress    BSW VBCI Social Work Care Plan       Problems:   Housing   CSW Clinical Goal(s):   Over the next 30 days the Patient will work with Child psychotherapist to address concerns related to housing/utilties.  Interventions:  Social Determinants of Health in Patient with housing and utilities assistance: SDOH assessments completed: Financial Strain  and Housing  Evaluation of current treatment plan related to unmet needs SW sent patient resources to assist with a deposit for utilities.  Patient Goals/Self-Care Activities:  Patient will utilize resources emailed to her  Plan:   The care management team will reach out to the patient again over the next 15 days.         Social Worker will follow up on 05/08/24 at 3:30.   Valora Gear, Florestine Hurl, MHA [place]  [facility] Social Worker, Population Health [phone]   Following is a copy of your plan of care:  There are no care plans that you recently modified to display for this patient.

## 2024-04-30 ENCOUNTER — Telehealth: Payer: Self-pay | Admitting: Licensed Clinical Social Worker

## 2024-05-01 ENCOUNTER — Other Ambulatory Visit: Payer: Self-pay | Admitting: Licensed Clinical Social Worker

## 2024-05-01 DIAGNOSIS — F4310 Post-traumatic stress disorder, unspecified: Secondary | ICD-10-CM

## 2024-05-01 DIAGNOSIS — F32A Depression, unspecified: Secondary | ICD-10-CM

## 2024-05-01 DIAGNOSIS — F410 Panic disorder [episodic paroxysmal anxiety] without agoraphobia: Secondary | ICD-10-CM

## 2024-05-01 NOTE — Patient Outreach (Signed)
 Complex Care Management   Visit Note  05/01/2024  Name:  Jasmine Gordon MRN: [id number] DOB: January 17, 1986  Situation: Referral received for Complex Care Management related to Mental/Behavioral Health diagnosis Depression I obtained verbal consent from Patient.  Visit completed with VBCI LCSW on the phone  Background:   Past Medical History:  Diagnosis Date   Allergy    Anemia    Anxiety    Asthma    HPV (human papilloma virus) infection    Migraine    Obese    Panic attack    PTSD (post-traumatic stress disorder) 2016    Assessment: Patient Reported Symptoms:  Cognitive Cognitive Status: Alert and oriented to person, place, and time, Normal speech and language skills Cognitive/Intellectual Conditions Management [RPT]: None reported or documented in medical history or problem list   Health Maintenance Behaviors: Annual physical exam, Stress management, Spiritual practice(s) Healing Pattern: Average Health Facilitated by: Rest, Stress management  Neurological Neurological Review of Symptoms: No symptoms reported    HEENT   HEENT Comment: No Symptoms Reported    Cardiovascular  No Symptoms Reported     Respiratory  No Symptoms Reported     Endocrine  No Symptoms Reported     Gastrointestinal    No Symptoms Reported     Genitourinary  No Symptoms Reported     Integumentary    No Symptoms Reported   Musculoskeletal Musculoskelatal Symptoms Reviewed: No symptoms reported        Psychosocial Psychosocial Symptoms Reported: Anxiety - if selected complete GAD, Depression - if selected complete PHQ 2-9 Additional Psychological Details: Pt reports that she did not start therapy. She reports that she is still interested but does not wish to gain psychiatry services at this time. She reports that her stress levels are high as she plans to relocate out of the home with her spouse. Emotional support provided and referral placed successfully for therapy on  05/01/24. Behavioral Health Conditions: Anxiety, Depression, Post traumatic stress Behavioral Management Strategies: Exercise, Coping strategies Behavioral Health Self-Management Outcome: 2 (bad) Behavioral Health Comment: Urgent therapy referral placed to [facility] Major Change/Loss/Stressor/Fears (CP): Relationship concerns Behaviors When Feeling Stressed/Fearful: Rest, Withdraw Techniques to Cope with Loss/Stress/Change: Medication, Diversional activities, Exercise, Withdraw, Spiritual practice(s) Quality of Family Relationships: stressful, disruptive Do you feel physically threatened by others?: No      05/01/2024    3:16 PM  Depression screen PHQ 2/9  Decreased Interest 1  Down, Depressed, Hopeless 3  PHQ - 2 Score 4  Altered sleeping 0  Tired, decreased energy 1  Change in appetite 0  Feeling bad or failure about yourself  1  Trouble concentrating 1  Moving slowly or fidgety/restless 0  Suicidal thoughts 0  PHQ-9 Score 7  Difficult doing work/chores Somewhat difficult      05/01/2024    3:30 PM 04/22/2024    4:39 PM 04/18/2024    4:30 PM 03/14/2024    1:15 PM  GAD 7 : Generalized Anxiety Score  Nervous, Anxious, on Edge 2 2 2  0  Control/stop worrying 1 1 1  0  Worry too much - different things 1 1 1 1   Trouble relaxing 1 1  0  Restless 2 1 1  0  Easily annoyed or irritable 1 1 1  0  Afraid - awful might happen 1 0 0 0  Total GAD 7 Score 9 7  1   Anxiety Difficulty Very difficult Very difficult Very difficult Not difficult at all   There were no vitals filed  for this visit.  Medications Reviewed Today     Reviewed by Elie Grove, LCSW (Social Worker) on 05/01/24 at 1527  Med List Status: <None>   Medication Order Taking? Sig Documenting Provider Last Dose Status Informant  albuterol  (PROVENTIL ) (2.5 MG/3ML) 0.083% nebulizer solution [id number]  Take 3 mLs (2.5 mg total) by nebulization every 4 (four) hours as needed for wheezing or shortness of breath. Karamalegos,  Kayleen Party, DO  Active   albuterol  (VENTOLIN  HFA) [phone] Base) MCG/ACT inhaler [id number]  Inhale 2 puffs into the lungs every 6 (six) hours as needed for wheezing or shortness of breath. Raina Bunting, DO  Active   ASHWAGANDHA GUMMIES PO [id number]  Take by mouth. [provider]  Active   cetirizine  (ZYRTEC ) 10 MG tablet [id number]  Take 10 mg by mouth daily as needed for allergies. [provider]  Active   chlorhexidine (PERIDEX) 0.12 % solution [id number]  Use as directed 10 mLs in the mouth or throat. [provider]  Active   cholecalciferol (VITAMIN D3) 25 MCG (1000 UNIT) tablet [id number]  Take 1,000 Units by mouth daily. [provider]  Active   fluticasone  (FLONASE ) 50 MCG/ACT nasal spray [id number]  Place 1 spray into both nostrils daily. Use for 4-6 weeks then stop and use seasonally or as needed. Raina Bunting, DO  Active   hydrOXYzine  (ATARAX ) 10 MG tablet [id number]  Take 1 tablet (10 mg total) by mouth 3 (three) times daily as needed for anxiety. Raina Bunting, DO  Active   montelukast  (SINGULAIR ) 10 MG tablet [id number]  Take 10 mg by mouth at bedtime as needed. [provider]  Active   Multiple Vitamins-Minerals (ONE-A-DAY WOMENS PO) [id number]  Take 1 tablet by mouth daily. [provider]  Active   Spacer/Aero-Holding Chambers (AEROCHAMBER MV) inhaler [id number]  Use as instructed Raina Bunting, DO  Active             Recommendation:   Referral to: [facility] Continue Current Plan of Care  Follow Up Plan:   Telephone follow-up on 05/17/24 at 1 pm with VBCI LCSW  Hurman Ketelsen, BSW, MSW, LCSW Licensed Clinical Social Worker [place]   [facility] [email or web address] Direct Dial: [phone]

## 2024-05-01 NOTE — Patient Instructions (Signed)
 Visit Information  Ms. Riko Lumsden was given information about Medicaid Managed Care team care coordination services as a part of their Healthy [facility] Medicaid benefit. Hafsah Denisha Hoel verbally consented to engagement with the [facility] Managed Care team.   If you are experiencing a medical emergency, please call 911 or report to your local emergency department or urgent care.   If you have a non-emergency medical problem during routine business hours, please contact your provider's office and ask to speak with a nurse.   For questions related to your Healthy [facility] health plan, please call: [phone] or visit the homepage here: [email or web address]  If you would like to schedule transportation through your Healthy [facility] plan, please call the following number at least 2 days in advance of your appointment: [phone]  For information about your ride after you set it up, call Ride Assist at [phone]. Use this number to activate a Will Call pickup, or if your transportation is late for a scheduled pickup. Use this number, too, if you need to make a change or cancel a previously scheduled reservation.  If you need transportation services right away, call [phone]. The after-hours call center is staffed 24 hours to handle ride assistance and urgent reservation requests (including discharges) 365 days a year. Urgent trips include sick visits, hospital discharge requests and life-sustaining treatment.  Call the [facility] Line at [phone], at any time, 24 hours a day, 7 days a week. If you are in danger or need immediate medical attention call 911.  If you would like help to quit smoking, call 1-800-QUIT-NOW ((937) 638-6041) OR Espaol: 1-855-Djelo-Ya (8-756-433-2951) o para ms informacin haga clic aqu or Text READY to 884-166 to register via text     24- Hour Availability:    [facility]   [address] [place], [place] [phone] Crisis [phone]   [facility] the [facility] [phone]  [place] [facility]  [phone]    [facility]  [phone] (after hours)   Therapeutic Alternative/Mobile Crisis   [phone]   [place]  National Suicide Hotline  938-698-2052 Derrel Flies) OR 988   Call 630-843-4135 for mental health emergencies   [facility]  [phone]);  [place] and [employer]  [phone]); [place], [place], [place], [place], [place], [place], [place]    [facility] Residents For 24/7 walk-up access to mental health services for [facility] children (4+), adolescents and adults, please visit the [facility] located at [address] in [place], [place].  *[facility] also provides comprehensive outpatient behavioral health services in a variety of locations around the [place] .  Connect With Us  [address] [place], [place] [address] HelpLine: 226-513-8425 or 1-928 630 7665  Get Directions  Find Help 24/7 By Phone Call our 24-hour HelpLine at 228-766-5201 or 779-256-1607 for immediate assistance for mental health and substance abuse issues.  Walk-In Help [place]: [facility] (Ages 4 and Up) [place]: Emergency Dept., [facility] Additional Resources National Hopeline Network: 1-800-SUICIDE The National Suicide Prevention Lifeline: 0-998-338-SNKN       Licensed Clinical Social Worker will follow up with you by phone on 05/17/24 at 1 pm.  Kolleen Perone, BSW, MSW, LCSW Licensed Clinical Social Worker [place]   [facility] [email or web address] Direct Dial: [phone]

## 2024-05-08 ENCOUNTER — Other Ambulatory Visit: Payer: Self-pay

## 2024-05-08 NOTE — Patient Instructions (Signed)
 Visit Information  [name] was given information about Medicaid Managed Care team care coordination services as a part of their Healthy [facility] Medicaid benefit. Jasmine Gordon verbally consented to engagement with the [facility] Managed Care team.   If you are experiencing a medical emergency, please call 911 or report to your local emergency department or urgent care.   If you have a non-emergency medical problem during routine business hours, please contact your provider's office and ask to speak with a nurse.   For questions related to your Healthy [facility] health plan, please call: [phone] or visit the homepage here: [email or web address]  If you would like to schedule transportation through your Healthy [facility] plan, please call the following number at least 2 days in advance of your appointment: [phone]  For information about your ride after you set it up, call Ride Assist at [phone]. Use this number to activate a Will Call pickup, or if your transportation is late for a scheduled pickup. Use this number, too, if you need to make a change or cancel a previously scheduled reservation.  If you need transportation services right away, call [phone]. The after-hours call center is staffed 24 hours to handle ride assistance and urgent reservation requests (including discharges) 365 days a year. Urgent trips include sick visits, hospital discharge requests and life-sustaining treatment.  Call the [facility] Line at [phone], at any time, 24 hours a day, 7 days a week. If you are in danger or need immediate medical attention call 911.  If you would like help to quit smoking, call 1-800-QUIT-NOW ((616)338-6846) OR Espaol: 1-855-Djelo-Ya (4-742-595-6387) o para ms informacin haga clic aqu or Text READY to 564-332 to register via text  [name] following are the goals we discussed in your visit  today:   Goals Addressed             This Visit's Progress    BSW VBCI Social Work Care Plan       Problems:   Housing   CSW Clinical Goal(s):   Over the next 30 days the Patient will work with Child psychotherapist to address concerns related to housing/utilties.  Interventions:  Social Determinants of Health in Patient with housing and utilities assistance: SDOH assessments completed: Financial Strain  and Housing  Evaluation of current treatment plan related to unmet needs SW will resent resources for assistance with utilties  Patient Goals/Self-Care Activities:  Patient will utilize resources emailed to her  Plan:   The care management team will reach out to the patient again over the next 30 days.         Social Worker will follow up in 30 days.   Jasmine Gordon, Jasmine Gordon, MHA [place]  [facility] Institute Social Worker, Population Health [phone]   Following is a copy of your plan of care:  There are no care plans that you recently modified to display for this patient.

## 2024-05-08 NOTE — Patient Outreach (Signed)
 Complex Care Management   Visit Note  05/08/2024  Name:  Jasmine Gordon MRN: [id number] DOB: 04-Oct-1986  Situation: Referral received for Complex Care Management related to SDOH Barriers:  Lack of essential utilities assistance with deposit I obtained verbal consent from Patient.  Visit completed with patient  on the phone  Background:   Past Medical History:  Diagnosis Date   Allergy    Anemia    Anxiety    Asthma    HPV (human papilloma virus) infection    Migraine    Obese    Panic attack    PTSD (post-traumatic stress disorder) 2016    Assessment: SW completed a telephone outreach with patient, she did receive some assistance from the family justice center. SW and patient agreed for resources for utilities to be resent via email.   SDOH Interventions    Flowsheet Row Patient Outreach Telephone from 05/01/2024 in [place] POPULATION HEALTH DEPARTMENT Patient Outreach Telephone from 04/22/2024 in [place] POPULATION HEALTH DEPARTMENT Patient Outreach Telephone from 04/18/2024 in [place] POPULATION HEALTH DEPARTMENT Patient Outreach Telephone from 03/14/2024 in [place] POPULATION HEALTH DEPARTMENT Patient Outreach Telephone from 01/16/2024 in [place] POPULATION HEALTH DEPARTMENT Patient Outreach Telephone from 01/15/2024 in [place] POPULATION HEALTH DEPARTMENT  SDOH Interventions        Food Insecurity Interventions Intervention Not Indicated -- -- Intervention Not Indicated -- --  Housing Interventions Intervention Not Indicated, Other (Comment)  [However, pt wishes to relocate from her current living situation/environment] -- -- Intervention Not Indicated -- --  Transportation Interventions Intervention Not Indicated -- -- Intervention Not Indicated -- --  Utilities Interventions Intervention Not Indicated -- -- Intervention Not Indicated -- --  Alcohol Usage Interventions -- -- -- Intervention Not Indicated (Score <7) -- Intervention Not Indicated (Score <7)   Depression Interventions/Treatment  [employer] Resources Provided Medication  [Pending outreach with LCSW on April 30, 2024. Agreeable to establishing care with local counselor. Collaborated with PCP. Will discuss options for medications for better symptom management during her appointment on May 06, 2024.] Medication -- -- --  Surveyor, quantity Strain Interventions [employer] Resources Provided  [employer] Review completed] -- -- Other (Comment)  [Declined current need for resources] -- --  Physical Activity Interventions -- -- -- Intervention Not Indicated  [Reports exercising/walking 2 to 3 miles in the mornings] -- --  Stress Interventions Community Resources Provided -- -- Intervention Not Indicated  [Reports doing well since starting current morning routine. Reports walking several mornings a week in her local park. Notes significant improvement with stress and overall health.] [employer] Provided, Provide Counseling --  Social Connections Interventions -- -- -- Intervention Not Indicated -- --  Health Literacy Interventions -- -- -- Intervention Not Indicated -- --    Recommendation:   No recommendations at this time  Follow Up Plan:   Telephone follow-up in 1 month  Valora Gear, BSW, [facility]   Value Based Care Institute Social Worker, Population Health [phone]

## 2024-05-09 ENCOUNTER — Encounter: Payer: Self-pay | Admitting: Family Medicine

## 2024-05-09 ENCOUNTER — Ambulatory Visit (INDEPENDENT_AMBULATORY_CARE_PROVIDER_SITE_OTHER): Payer: Medicaid Other | Admitting: Family Medicine

## 2024-05-09 VITALS — BP 118/72 | HR 105 | Ht 65.0 in | Wt 265.1 lb

## 2024-05-09 DIAGNOSIS — F410 Panic disorder [episodic paroxysmal anxiety] without agoraphobia: Secondary | ICD-10-CM | POA: Diagnosis not present

## 2024-05-09 DIAGNOSIS — R7303 Prediabetes: Secondary | ICD-10-CM

## 2024-05-09 DIAGNOSIS — F411 Generalized anxiety disorder: Secondary | ICD-10-CM

## 2024-05-09 DIAGNOSIS — Z6841 Body Mass Index (BMI) 40.0 and over, adult: Secondary | ICD-10-CM | POA: Diagnosis not present

## 2024-05-09 LAB — POCT GLYCOSYLATED HEMOGLOBIN (HGB A1C): Hemoglobin A1C: 5.6 % (ref 4.0–5.6)

## 2024-05-09 MED ORDER — TIRZEPATIDE 2.5 MG/0.5ML ~~LOC~~ SOAJ
2.5000 mg | SUBCUTANEOUS | Status: DC
Start: 2024-05-09 — End: 2024-05-09

## 2024-05-09 MED ORDER — WEGOVY 0.25 MG/0.5ML ~~LOC~~ SOAJ: 0.2500 mg | SUBCUTANEOUS | Status: DC

## 2024-05-09 NOTE — Progress Notes (Signed)
 Subjective:    Patient ID: Jasmine Gordon, female    DOB: 07-Aug-1986, 38 y.o.   MRN: [id number]  Jasmine Gordon is a 38 y.o. female presenting on 05/09/2024 for Obesity and Pre-Diabetes   HPI  Discussed the use of AI scribe software for clinical note transcription with the patient, who gave verbal consent to proceed.  History of Present Illness   Jasmine Gordon is a 38 year old female who presents with urinary symptoms and weight fluctuations.    Incomplete Emptying / Urinary Urge She experiences urinary symptoms, including frequent urination and a sensation of incomplete bladder emptying. The flow is described as 'really slow', and she often feels the urge to urinate again shortly after voiding. These symptoms are more pronounced in the morning. No bladder spasms or discomfort are noted. She mentions that her house gets warm at night, speculating that she may retain more fluid due to the heat.  Morbid obesity BMI >44 Difficulty losing weight Prior trial on Contrave , did not tolerate well. Tried Wegovy  as well in past. Side effects could not tolerate For past >6 months she is trying to improve lifestyle interventions to manage her weight. Improved lifestyle diet and goal to resume exercise Intermittent fasting. She has been experiencing weight fluctuations, particularly gaining 5-6 pounds in the week leading up to her menstrual cycle, which then resolves within a week. She is concerned about this pattern and its impact on her weight management efforts. She has been more active, engaging in walking and other activities, and has been mindful of her sugar intake, which she believes has contributed to her recent A1c result of 5.6, the best in three to four years.  Weight 265 lbs Walking regularly at track Taking MVI intermittently    Vitamin D  Deficiency On supplement. Intermittently    Anxiety / Depression Has [facility] Dr Edda Goo, and therapist. Off SSRI  medication She has a history of using antidepressants due to stress related to personal issues, including a separation from her partner. She is currently trying to manage her stress and emotions during this transition period.    Elevated A1c PreDM A1c at 5.6, improved from, prior 5.7 to 6.1 range Fam history with parents have Diabetes Concerned about her risk of progression to diabetes Not eating as much starch carb sugar.      05/09/2024    8:14 AM 05/01/2024    3:16 PM 04/22/2024    4:36 PM  Depression screen PHQ 2/9  Decreased Interest 1 1 1   Down, Depressed, Hopeless 1 3 3   PHQ - 2 Score 2 4 4   Altered sleeping 1 0 0  Tired, decreased energy 1 1 1   Change in appetite 1 0 0  Feeling bad or failure about yourself  1 1 1   Trouble concentrating 1 1 1   Moving slowly or fidgety/restless 0 0 0  Suicidal thoughts 0 0 0  PHQ-9 Score 7 7 7   Difficult doing work/chores Somewhat difficult Somewhat difficult Somewhat difficult       05/09/2024    8:15 AM 05/01/2024    3:30 PM 04/22/2024    4:39 PM 04/18/2024    4:30 PM  GAD 7 : Generalized Anxiety Score  Nervous, Anxious, on Edge 2 2 2 2   Control/stop worrying 2 1 1 1   Worry too much - different things 2 1 1 1   Trouble relaxing 2 1 1    Restless 1 2 1 1   Easily annoyed or irritable 2 1 1  1  Afraid - awful might happen 3 1 0 0  Total GAD 7 Score 14 9 7    Anxiety Difficulty Very difficult Very difficult Very difficult Very difficult    Social History   Tobacco Use   Smoking status: Never    Passive exposure: Never   Smokeless tobacco: Never  Vaping Use   Vaping status: Never Used  Substance Use Topics   Alcohol use: Not Currently   Drug use: No    Review of Systems Per HPI unless specifically indicated above     Objective:    BP 118/72 (BP Location: Right Arm, Patient Position: Sitting, Cuff Size: Large)   Pulse (!) 105   Ht 5' 5 (1.651 m)   Wt 265 lb 2 oz (120.3 kg)   SpO2 99%   BMI 44.12 kg/m   Wt Readings from  Last 3 Encounters:  05/09/24 265 lb 2 oz (120.3 kg)  02/27/24 265 lb (120.2 kg)  02/26/24 261 lb 0.4 oz (118.4 kg)    Physical Exam Vitals and nursing note reviewed.  Constitutional:      General: She is not in acute distress.    Appearance: Normal appearance. She is well-developed. She is obese. She is not diaphoretic.     Comments: Well-appearing, comfortable, cooperative  HENT:     Head: Normocephalic and atraumatic.   Eyes:     General:        Right eye: No discharge.        Left eye: No discharge.     Conjunctiva/sclera: Conjunctivae normal.    Cardiovascular:     Rate and Rhythm: Normal rate.  Pulmonary:     Effort: Pulmonary effort is normal.   Musculoskeletal:     Right lower leg: No edema.     Left lower leg: No edema.   Skin:    General: Skin is warm and dry.     Findings: No erythema or rash.   Neurological:     Mental Status: She is alert and oriented to person, place, and time.   Psychiatric:        Mood and Affect: Mood normal.        Behavior: Behavior normal.        Thought Content: Thought content normal.     Comments: Well groomed, good eye contact, normal speech and thoughts     Results for orders placed or performed in visit on 05/09/24  POCT HgB A1C   Collection Time: 05/09/24  8:36 AM  Result Value Ref Range   Hemoglobin A1C 5.6 4.0 - 5.6 %   HbA1c POC (<> result, manual entry)     HbA1c, POC (prediabetic range)     HbA1c, POC (controlled diabetic range)        Assessment & Plan:   Problem List Items Addressed This Visit     Generalized anxiety disorder with panic attacks   Morbid obesity with BMI of 40.0-44.9, adult (HCC)   Relevant Medications   WEGOVY  0.25 MG/0.5ML SOAJ   Pre-diabetes - Primary   Relevant Orders   POCT HgB A1C (Completed)     Emotional distress related to separation Experiencing emotional distress due to separation from her partner, focusing on reasons for separation without emotional influence.  Urinary  symptoms Reports frequent urination, incomplete emptying, and slow stream. Discussed fluid retention and body position impact. - Implement bladder retraining techniques including double voiding and timed voiding. - Consider further urinalysis and work up as indicated. Lack of other  symptoms today we will defer this  Morbid Obesity, BMI >44 Weight gain of 5-6 pounds pre-menstrual cycle, resolving post-cycle. Discussed Zepbound Florence Hunt) as an alternative to Wegovy  for weight management, explaining its benefits and Medicaid approval process. Past history failed Wegovy  due to side effects. Stopped early.  - Provide a sample of Zepbound (Mounjaro) for a one-month trial. However she did return to office and changed mind, preferred Wegovy  sample instead. - She will contact us  within 3-4 weeks if want to pursue order for Wegovy  dose adjust to get covered - Monitor weight and adjust medication dosage as needed to meet Medicaid requirements for weight loss.  Prediabetes A1c is 5.6, indicating successful blood sugar management through reduced sugar intake and increased activity.  General Health Maintenance Taking multivitamins intermittently, more active, and trying apple cider vinegar in water.  Follow-up Advised follow-up for weight check and medication approval in four months. Annual physical planned for October. - Schedule a follow-up appointment in four months for weight check and medication approval. - Plan for an annual physical in October.        Orders Placed This Encounter  Procedures   POCT HgB A1C    Meds ordered this encounter  Medications   DISCONTD: tirzepatide (MOUNJARO) 2.5 MG/0.5ML Pen    Sig: Inject 2.5 mg into the skin once a week.   WEGOVY  0.25 MG/0.5ML SOAJ    Sig: Inject 0.25 mg into the skin once a week.    Follow up plan: Return in about 4 months (around 09/08/2024) for 4 month Weight check.   Domingo Friend, DO [facility] 05/09/2024, 8:33 AM

## 2024-05-09 NOTE — Patient Instructions (Addendum)
 Thank you for coming to the office today.  Try Tirzepatidie (Mounjaro / Zepbound) 2.5mg  weekly for 4 weeks Message me back if you want me to officially order Zepbound 2.5mg  and we can dose increase each month for 3 months then return for follow-up.  Recent Labs    09/25/23 0833 05/09/24 0836  HGBA1C 6.0* 5.6    Please schedule a Follow-up Appointment to: Return in about 4 months (around 09/08/2024) for 4 month Weight check.  If you have any other questions or concerns, please feel free to call the office or send a message through MyChart. You may also schedule an earlier appointment if necessary.  Additionally, you may be receiving a survey about your experience at our office within a few days to 1 week by e-mail or mail. We value your feedback.  Domingo Friend, DO [facility], [place]

## 2024-05-16 ENCOUNTER — Encounter: Payer: Self-pay | Admitting: Family Medicine

## 2024-05-17 ENCOUNTER — Other Ambulatory Visit: Payer: Self-pay | Admitting: Licensed Clinical Social Worker

## 2024-05-17 NOTE — Patient Instructions (Signed)
 Visit Information  Thank you for taking time to visit with me today. Please don't hesitate to contact me if I can be of assistance to you before our next scheduled appointment.  Your next care management appointment is by telephone on 05/28/24 at 3  Please call the care guide team at [phone] if you need to cancel, schedule, or reschedule an appointment.   Please call the Suicide and Crisis Lifeline: 988 call the [place]  National Suicide Prevention Lifeline: 980-113-0983 or TTY: 785-880-2671 TTY (719)491-0591) to talk to a trained counselor call 1-800-273-TALK (toll free, 24 hour hotline) if you are experiencing a Mental Health or Behavioral Health Crisis or need someone to talk to.  Lyle Rung, BSW, MSW, LCSW Licensed Clinical Social Worker [employer]   [facility] [email or web address] Direct Dial: [phone]

## 2024-05-17 NOTE — Patient Outreach (Signed)
 Complex Care Management   Visit Note  05/17/2024  Name:  Jasmine Gordon MRN: [id number] DOB: 1985-12-17  Situation: Referral received for Complex Care Management related to Depression I obtained verbal consent from Patient.  Visit completed with VBCI LCSW on the phone.  Background:   Past Medical History:  Diagnosis Date   Allergy    Anemia    Anxiety    Asthma    HPV (human papilloma virus) infection    Migraine    Obese    Panic attack    PTSD (post-traumatic stress disorder) 2016    Assessment: Patient Reported Symptoms:  Cognitive Cognitive Status: Able to follow simple commands, Normal speech and language skills, Alert and oriented to person, place, and time Cognitive/Intellectual Conditions Management [RPT]: None reported or documented in medical history or problem list   Health Maintenance Behaviors: Annual physical exam, Stress management, Spiritual practice(s) Healing Pattern: Average Health Facilitated by: Rest, Stress management, Prayer/meditation  Neurological Neurological Review of Symptoms: No symptoms reported    HEENT HEENT Symptoms Reported: No symptoms reported      Cardiovascular Cardiovascular Symptoms Reported: No symptoms reported    Respiratory Respiratory Symptoms Reported: No symptoms reported    Endocrine Patient reports the following symptoms related to hypoglycemia or hyperglycemia : No symptoms reported    Gastrointestinal Gastrointestinal Symptoms Reported: Obesity      Genitourinary Genitourinary Symptoms Reported: No symptoms reported    Integumentary Integumentary Symptoms Reported: No symptoms reported Skin Conditions: Dermatitis  Musculoskeletal Musculoskelatal Symptoms Reviewed: No symptoms reported        Psychosocial Psychosocial Symptoms Reported: Anxiety - if selected complete GAD, Depression - if selected complete PHQ 2-9, Sadness - if selected complete PHQ 2-9 Additional Psychological Details: Pt reports that she  started therapy at [facility] and is going weekly. She reports that she does not wish to gain psychiatry services at this time. She reports that her stress levels are high as she has relocated out of the home with her spouse. Emotional support provided Behavioral Health Conditions: Depression, Anxiety, Post traumatic stress Behavioral Management Strategies: Coping strategies Behavioral Health Self-Management Outcome: 1 (very bad) Major Change/Loss/Stressor/Fears (CP): Relationship concerns Behaviors When Feeling Stressed/Fearful: Rest, Withdraw Techniques to Cope with Loss/Stress/Change: Medication, Diversional activities, Withdraw, Spiritual practice(s) Quality of Family Relationships: stressful, disruptive Do you feel physically threatened by others?: No (But emotional distress present)      05/17/2024    2:23 PM  Depression screen PHQ 2/9  Decreased Interest 2  Down, Depressed, Hopeless 2  PHQ - 2 Score 4  Altered sleeping 1  Tired, decreased energy 1  Change in appetite 1  Feeling bad or failure about yourself  1  Trouble concentrating 2  Moving slowly or fidgety/restless 0  Suicidal thoughts 0  PHQ-9 Score 10  Difficult doing work/chores Very difficult    There were no vitals filed for this visit.  Medications Reviewed Today     Reviewed by Merlynn Lyle CROME, LCSW (Social Worker) on 05/17/24 at 1422  Med List Status: <None>   Medication Order Taking? Sig Documenting Provider Last Dose Status Informant  albuterol  (PROVENTIL ) (2.5 MG/3ML) 0.083% nebulizer solution [id number]  Take 3 mLs (2.5 mg total) by nebulization every 4 (four) hours as needed for wheezing or shortness of breath. Karamalegos, Marsa PARAS, DO  Active   albuterol  (VENTOLIN  HFA) 108 [phone] Base) MCG/ACT inhaler [id number]  Inhale 2 puffs into the lungs every 6 (six) hours as needed for wheezing or shortness of  breath. Edman Marsa PARAS, DO  Active   ASHWAGANDHA GUMMIES PO [id number]  Take by mouth.  [provider]  Active   cetirizine  (ZYRTEC ) 10 MG tablet [id number]  Take 10 mg by mouth daily as needed for allergies. [provider]  Active   cholecalciferol (VITAMIN D3) 25 MCG (1000 UNIT) tablet [id number]  Take 1,000 Units by mouth daily. [provider]  Active   fluticasone  (FLONASE ) 50 MCG/ACT nasal spray [id number]  Place 1 spray into both nostrils daily. Use for 4-6 weeks then stop and use seasonally or as needed. Edman Marsa PARAS, DO  Active   hydrOXYzine  (ATARAX ) 10 MG tablet [id number]  Take 1 tablet (10 mg total) by mouth 3 (three) times daily as needed for anxiety. Edman Marsa PARAS, DO  Active   montelukast  (SINGULAIR ) 10 MG tablet [id number]  Take 10 mg by mouth at bedtime as needed. [provider]  Active   Multiple Vitamins-Minerals (ONE-A-DAY WOMENS PO) [id number]  Take 1 tablet by mouth daily. [provider]  Active   Spacer/Aero-Holding Chambers (AEROCHAMBER MV) inhaler [id number]  Use as instructed Edman Marsa PARAS, DO  Active   WEGOVY  0.25 MG/0.5ML EMMANUEL [id number]  Inject 0.25 mg into the skin once a week. Edman Marsa PARAS, DO  Active             Recommendation:   PCP Follow-up Continue Current Plan of Care  Follow Up Plan:   Telephone follow-up in 1 month  Lyle Rung, BSW, MSW, LCSW Licensed Clinical Social Worker [employer] [facility]   [email or web address] Direct Dial: [phone]

## 2024-05-20 ENCOUNTER — Encounter: Payer: Self-pay | Admitting: Family Medicine

## 2024-05-21 ENCOUNTER — Encounter: Payer: Self-pay | Admitting: Family Medicine

## 2024-05-22 ENCOUNTER — Other Ambulatory Visit: Payer: Self-pay

## 2024-05-22 NOTE — Patient Instructions (Signed)
 Visit Information  [name] was given information about Medicaid Managed Care team care coordination services as a part of their Healthy [facility] Medicaid benefit. Jasmine Gordon verbally consented to engagement with the [facility] Managed Care team.   If you are experiencing a medical emergency, please call 911 or report to your local emergency department or urgent care.   If you have a non-emergency medical problem during routine business hours, please contact your provider's office and ask to speak with a nurse.   For questions related to your Healthy [facility] health plan, please call: [phone] or visit the homepage here: [email or web address]  If you would like to schedule transportation through your Healthy [facility] plan, please call the following number at least 2 days in advance of your appointment: [phone]  For information about your ride after you set it up, call Ride Assist at [phone]. Use this number to activate a Will Call pickup, or if your transportation is late for a scheduled pickup. Use this number, too, if you need to make a change or cancel a previously scheduled reservation.  If you need transportation services right away, call [phone]. The after-hours call center is staffed 24 hours to handle ride assistance and urgent reservation requests (including discharges) 365 days a year. Urgent trips include sick visits, hospital discharge requests and life-sustaining treatment.  Call the [facility] Line at [phone], at any time, 24 hours a day, 7 days a week. If you are in danger or need immediate medical attention call 911.  If you would like help to quit smoking, call 1-800-QUIT-NOW ((616)338-6846) OR Espaol: 1-855-Djelo-Ya (4-742-595-6387) o para ms informacin haga clic aqu or Text READY to 564-332 to register via text  [name] following are the goals we discussed in your visit  today:   Goals Addressed             This Visit's Progress    BSW VBCI Social Work Care Plan       Problems:   Housing   CSW Clinical Goal(s):   Over the next 30 days the Patient will work with Child psychotherapist to address concerns related to housing/utilties.  Interventions:  Social Determinants of Health in Patient with housing and utilities assistance: SDOH assessments completed: Financial Strain  and Housing  Evaluation of current treatment plan related to unmet needs SW will resent resources for assistance with utilties  Patient Goals/Self-Care Activities:  Patient will utilize resources emailed to her  Plan:   The care management team will reach out to the patient again over the next 30 days.         Social Worker will follow up in 30 days.   Valora Gear, Florestine Hurl, MHA [place]  [facility] Social Worker, Population Health [phone]   Following is a copy of your plan of care:  There are no care plans that you recently modified to display for this patient.

## 2024-05-22 NOTE — Patient Outreach (Signed)
 Complex Care Management   Visit Note  05/22/2024  Name:  Jasmine Gordon MRN: [id number] DOB: [birth year]  Situation: Referral received for Complex Care Management related to SDOH Barriers:  Lack of essential utilities assistance with deposit I obtained verbal consent from Patient.  Visit completed with patient  on the phone  Background:   Past Medical History:  Diagnosis Date   Allergy    Anemia    Anxiety    Asthma    HPV (human papilloma virus) infection    Migraine    Obese    Panic attack    PTSD (post-traumatic stress disorder) 2016    Assessment: SW spoke with patient, she was in a Psychologist, forensic. [place] states she did locate a job and could not talk. SW and patient agreed for a follow up call in 30 days.   SDOH Interventions    Flowsheet Row Patient Outreach Telephone from 05/17/2024 in [place] POPULATION HEALTH DEPARTMENT Patient Outreach Telephone from 05/01/2024 in [place] POPULATION HEALTH DEPARTMENT Patient Outreach Telephone from 04/22/2024 in [place] POPULATION HEALTH DEPARTMENT Patient Outreach Telephone from 04/18/2024 in [place] POPULATION HEALTH DEPARTMENT Patient Outreach Telephone from 03/14/2024 in [place] POPULATION HEALTH DEPARTMENT Patient Outreach Telephone from 01/16/2024 in [place] POPULATION HEALTH DEPARTMENT  SDOH Interventions        Food Insecurity Interventions Community Resources Provided, AMB Referral  [Pt will apply for EBT today] Intervention Not Indicated -- -- Intervention Not Indicated --  Housing Interventions Community Resources Provided, AMB Referral Intervention Not Indicated, Other (Comment)  [However, pt wishes to relocate from her current living situation/environment] -- -- Intervention Not Indicated --  Transportation Interventions -- Intervention Not Indicated -- -- Intervention Not Indicated --  Utilities Interventions -- Intervention Not Indicated -- -- Intervention Not Indicated --  Alcohol Usage  Interventions -- -- -- -- Intervention Not Indicated (Score <7) --  Depression Interventions/Treatment  [employer] Resources Provided  [employer] Provided Medication  [employer] outreach with [employer] on April 30, 2024. Agreeable to establishing care with local counselor. Collaborated with PCP. Will discuss options for medications for better symptom management during her appointment on May 06, 2024.] Medication -- --  Corporate treasurer Interventions [employer] Provided, Other (Comment)  [VBCI BSW REFERRAL] [employer] Resources Provided  [employer] Review completed] -- -- Other (Comment)  [Declined current need for resources] --  Physical Activity Interventions -- -- -- -- Intervention Not Indicated  [Reports exercising/walking 2 to 3 miles in the mornings] --  Stress Interventions [employer] Provided, [employer]  [phone] crisis resource provided] [employer] Provided -- -- Intervention Not Indicated  [Reports doing well since starting current morning routine. Reports walking several mornings a week in her local park. Notes significant improvement with stress and overall health.] [employer] Provided, Provide Counseling  Social Connections Interventions -- -- -- -- Intervention Not Indicated --  Health Literacy Interventions -- -- -- -- Intervention Not Indicated --      Recommendation:   No recommendations at this time.  Follow Up Plan:   Telephone follow-up in 1 month  Thersia Hoar, BSW, [facility]  [facility] Social Worker, Population Health [phone]

## 2024-05-27 ENCOUNTER — Telehealth: Payer: Self-pay

## 2024-05-28 ENCOUNTER — Other Ambulatory Visit: Payer: Self-pay | Admitting: Licensed Clinical Social Worker

## 2024-05-28 NOTE — Patient Outreach (Addendum)
 Complex Care Management   Visit Note  05/28/2024  Name:  Jasmine Gordon MRN: [id number] DOB: [birth year]  Situation: Referral received for Complex Care Management related to SDOH Barriers:  Depression I obtained verbal consent from Patient.  Visit completed with VBCI LCSW  on the phone  Background:   Past Medical History:  Diagnosis Date   Allergy    Anemia    Anxiety    Asthma    HPV (human papilloma virus) infection    Migraine    Obese    Panic attack    PTSD (post-traumatic stress disorder) 2016    Assessment:  Cognitive Cognitive Status: Able to follow simple commands, Alert and oriented to person, place, and time, Normal speech and language skills Cognitive/Intellectual Conditions Management [RPT]: None reported or documented in medical history or problem list   Health Maintenance Behaviors: Annual physical exam, Stress management, Spiritual practice(s) Healing Pattern: Average Health Facilitated by: Rest, Stress management, Prayer/meditation  Neurological Neurological Review of Symptoms: No symptoms reported Neurological Management Strategies: Routine screening  HEENT HEENT Symptoms Reported: No symptoms reported HEENT Management Strategies: Routine screening    Cardiovascular Cardiovascular Symptoms Reported: No symptoms reported    Respiratory Respiratory Symptoms Reported: No symptoms reported Additional Respiratory Details: Evaluated in the Emergency Department on February 26, 2024 for shortness of breath. Reports doing well since being evaluated. Respiratory Management Strategies: Adequate rest Respiratory Self-Management Outcome: 4 (good)  Endocrine Endocrine Symptoms Reported: No symptoms reported Is patient diabetic?: No Endocrine Self-Management Outcome: 4 (good)  Gastrointestinal    No symptoms reported    Genitourinary Genitourinary Symptoms Reported: No symptoms reported    Integumentary Integumentary Symptoms Reported: No symptoms reported     Musculoskeletal Musculoskelatal Symptoms Reviewed: No symptoms reported        Psychosocial Psychosocial Symptoms Reported: Depression - if selected complete PHQ 2-9 Additional Psychological Details: Pt reports that she started therapy at [facility] and is going weekly but that this therapist has already left and she is waiting for a new one to arrive and start. She reports that her stress levels are high as she has relocated out of the home with her spouse but she is staying positive as she is in training currently for a new job opportunity. Emotional support provided Behavioral Management Strategies: Coping strategies Behavioral Health Self-Management Outcome: 3 (uncertain) Major Change/Loss/Stressor/Fears (CP): Relationship concerns Techniques to Cope with Loss/Stress/Change: Diversional activities, Spiritual practice(s), Withdraw Quality of Family Relationships: involved Do you feel physically threatened by others?: No      05/28/2024    3:21 PM  Depression screen PHQ 2/9  Decreased Interest 1  Down, Depressed, Hopeless 2  PHQ - 2 Score 3  Altered sleeping 1  Tired, decreased energy 1  Change in appetite 1  Feeling bad or failure about yourself  1  Trouble concentrating 2  Moving slowly or fidgety/restless 0  Suicidal thoughts 0  PHQ-9 Score 9  Difficult doing work/chores Very difficult      05/17/2024    2:27 PM 05/09/2024    8:15 AM 05/01/2024    3:30 PM 04/22/2024    4:39 PM  GAD 7 : Generalized Anxiety Score  Nervous, Anxious, on Edge 2 2 2 2   Control/stop worrying 2 2 1 1   Worry too much - different things 2 2 1 1   Trouble relaxing 3 2 1 1   Restless 1 1 2 1   Easily annoyed or irritable 2 2 1 1   Afraid - awful might happen  2 3 1  0  Total GAD 7 Score 14 14 9 7   Anxiety Difficulty Very difficult Very difficult Very difficult Very difficult    SDOH Screenings   Food Insecurity: Food Insecurity Present (05/28/2024)  Housing: High Risk (05/17/2024)   Transportation Needs: No Transportation Needs (05/28/2024)  Utilities: Not At Risk (05/01/2024)  Alcohol Screen: Low Risk  (05/01/2024)  Depression (PHQ2-9): Medium Risk (05/28/2024)  Financial Resource Strain: Medium Risk (05/28/2024)  Physical Activity: Sufficiently Active (03/14/2024)  Recent Concern: Physical Activity - Insufficiently Active (01/08/2024)  Social Connections: Moderately Integrated (03/14/2024)  Stress: Stress Concern Present (05/28/2024)  Tobacco Use: Low Risk  (05/09/2024)  Health Literacy: Adequate Health Literacy (03/14/2024)   There were no vitals filed for this visit.  Medications Reviewed Today     Reviewed by Merlynn Lyle CROME, LCSW (Social Worker) on 05/28/24 at 1507  Med List Status: <None>   Medication Order Taking? Sig Documenting Provider Last Dose Status Informant  albuterol  (PROVENTIL ) (2.5 MG/3ML) 0.083% nebulizer solution [id number]  Take 3 mLs (2.5 mg total) by nebulization every 4 (four) hours as needed for wheezing or shortness of breath. Edman Marsa PARAS, DO  Active   albuterol  (VENTOLIN  HFA) 108 (90 Base) MCG/ACT inhaler [id number]  Inhale 2 puffs into the lungs every 6 (six) hours as needed for wheezing or shortness of breath. Edman Marsa PARAS, DO  Active   ASHWAGANDHA GUMMIES PO [id number]  Take by mouth. [provider]  Active   cetirizine  (ZYRTEC ) 10 MG tablet [id number]  Take 10 mg by mouth daily as needed for allergies. [provider]  Active   cholecalciferol (VITAMIN D3) 25 MCG (1000 UNIT) tablet [id number]  Take 1,000 Units by mouth daily. [provider]  Active   fluticasone  (FLONASE ) 50 MCG/ACT nasal spray [id number]  Place 1 spray into both nostrils daily. Use for 4-6 weeks then stop and use seasonally or as needed. Edman Marsa PARAS, DO  Active   hydrOXYzine  (ATARAX ) 10 MG tablet [id number]  Take 1 tablet (10 mg total) by mouth 3 (three) times daily as needed for anxiety. Edman Marsa PARAS, DO  Active    montelukast  (SINGULAIR ) 10 MG tablet [id number]  Take 10 mg by mouth at bedtime as needed. [provider]  Active   Multiple Vitamins-Minerals (ONE-A-DAY WOMENS PO) [id number]  Take 1 tablet by mouth daily. [provider]  Active   Spacer/Aero-Holding Chambers (AEROCHAMBER MV) inhaler [id number]  Use as instructed Edman Marsa PARAS, DO  Active   WEGOVY  0.25 MG/0.5ML EMMANUEL [id number]  Inject 0.25 mg into the skin once a week. Edman Marsa PARAS, DO  Active             Recommendation:   PCP Follow-up Continue Current Plan of Care Review emailed resource list for [facility] support in your area  Follow Up Plan:   Telephone follow-up in 1 month  Lyle Merlynn, BSW, MSW, LCSW Licensed Clinical Social Worker [employer]   [facility] [email or web address] Direct Dial: [phone]

## 2024-05-28 NOTE — Patient Instructions (Signed)
 Visit Information  Thank you for taking time to visit with me today. Please don't hesitate to contact me if I can be of assistance to you before our next scheduled appointment.  Your next care management appointment is by telephone on 06/18/24 at 3:00 pm  Please call the care guide team at [phone] if you need to cancel, schedule, or reschedule an appointment.   The following coping skill education was provided for stress relief and mental health management: When your car dies or a deadline looms, how do you respond? Long-term, low-grade or acute stress takes a serious toll on your body and mind, so don't ignore feelings of constant tension. Stress is a natural part of life. However, too much stress can harm our health, especially if it continues every day. This is chronic stress and can put you at risk for heart problems like heart disease and depression. Understand what's happening inside your body and learn simple coping skills to combat the negative impacts of everyday stressors.  Types of Stress There are two types of stress: Emotional - types of emotional stress are relationship problems, pressure at work, financial worries, experiencing discrimination or having a major life change. Physical - Examples of physical stress include being sick having pain, not sleeping well, recovery from an injury or having an alcohol and drug use disorder. Fight or Flight Sudden or ongoing stress activates your nervous system and floods your bloodstream with adrenaline and cortisol, two hormones that raise blood pressure, increase heart rate and spike blood sugar. These changes pitch your body into a fight or flight response. That enabled our ancestors to outrun saber-toothed tigers, and it's helpful today for situations like dodging a car accident. But most modern chronic stressors, such as finances or a challenging relationship, keep your body in that heightened state, which hurts your health. Effects of  Too Much Stress If constantly under stress, most of us  will eventually start to function less well.  Multiple studies link chronic stress to a higher risk of heart disease, stroke, depression, weight gain, memory loss and even premature death, so it's important to recognize the warning signals. Talk to your doctor about ways to manage stress if you're experiencing any of these symptoms: Prolonged periods of poor sleep. Regular, severe headaches. Unexplained weight loss or gain. Feelings of isolation, withdrawal or worthlessness. Constant anger and irritability. Loss of interest in activities. Constant worrying or obsessive thinking. Excessive alcohol or drug use. Inability to concentrate.  10 Ways to Cope with Chronic Stress It's key to recognize stressful situations as they occur because it allows you to focus on managing how you react. We all need to know when to close our eyes and take a deep breath when we feel tension rising. Use these tips to prevent or reduce chronic stress. 1. Rebalance Work and Home All work and no play? If you're spending too much time at the office, intentionally put more dates in your calendar to enjoy time for fun, either alone or with others. 2. Get Regular Exercise Moving your body on a regular basis balances the nervous system and increases blood circulation, helping to flush out stress hormones. Even a daily 20-minute walk makes a difference. Any kind of exercise can lower stress and improve your mood ? just pick activities that you enjoy and make it a regular habit. 3. Eat Well and Limit Alcohol and Stimulants Alcohol, nicotine and caffeine  may temporarily relieve stress but have negative health impacts and can make stress worse in  the long run. Well-nourished bodies cope better, so start with a good breakfast, add more organic fruits and vegetables for a well-balanced diet, avoid processed foods and sugar, try herbal tea and drink more water. 4. Connect with  Supportive People Talking face to face with another person releases hormones that reduce stress. Lean on those good listeners in your life. 5. Carve Out Hobby Time Do you enjoy gardening, reading, listening to music or some other creative pursuit? Engage in activities that bring you pleasure and joy; research shows that reduces stress by almost half and lowers your heart rate, too. 6. Practice Meditation, Stress Reduction or Yoga Relaxation techniques activate a state of restfulness that counterbalances your body's fight-or-flight hormones. Even if this also means a 10-minute break in a long day: listen to music, read, go for a walk in nature, do a hobby, take a bath or spend time with a friend. Also consider doing a mindfulness exercise or try a daily deep breathing or imagery practice. Deep Breathing Slow, calm and deep breathing can help you relax. Try these steps to focus on your breathing and repeat as needed. Find a comfortable position and close your eyes. Exhale and drop your shoulders. Breathe in through your nose; fill your lungs and then your belly. Think of relaxing your body, quieting your mind and becoming calm and peaceful. Breathe out slowly through your nose, relaxing your belly. Think of releasing tension, pain, worries or distress. Repeat steps three and four until you feel relaxed. Imagery This involves using your mind to excite the senses -- sound, vision, smell, taste and feeling. This may help ease your stress. Begin by getting comfortable and then do some slow breathing. Imagine a place you love being at. It could be somewhere from your childhood, somewhere you vacationed or just a place in your imagination. Feel how it is to be in the place you're imagining. Pay attention to the sounds, air, colors, and who is there with you. This is a place where you feel cared for and loved. All is well. You are safe. Take in all the smells, sounds, tastes and feelings. As you do, feel  your body being nourished and healed. Feel the calm that surrounds you. Breathe in all the good. Breathe out any discomfort or tension. 7. Sleep Enough If you get less than seven to eight hours of sleep, your body won't tolerate stress as well as it could. If stress keeps you up at night, address the cause, and add extra meditation into your day to make up for the lost z's. Try to get seven to nine hours of sleep each night. Make a regular bedtime schedule. Keep your room dark and cool. Try to avoid computers, TV, cell phones and tablets before bed. 8. Bond with Connections You Enjoy Go out for a coffee with a friend, chat with a neighbor, call a family member, visit with a clergy member, or even hang out with your pet. Clinical studies show that spending even a short time with a companion animal can cut anxiety levels almost in half. 9. Take a Vacation Getting away from it all can reset your stress tolerance by increasing your mental and emotional outlook, which makes you a happier, more productive person upon return. Leave your cellphone and laptop at home! 10. See a Counselor, Coach or Therapist If negative thoughts overwhelm your ability to make positive changes, it's time to seek professional help. Make an appointment today--your health and life are worth it.  Please call the Suicide and Crisis Lifeline: 988 call the [place]  National Suicide Prevention Lifeline: 252-024-0111 or TTY: 575-031-0108 TTY 508 607 4344) to talk to a trained counselor if you are experiencing a Mental Health or Behavioral Health Crisis or need someone to talk to.  Lyle Rung, BSW, MSW, LCSW Licensed Clinical Social Worker [employer]   [email or web address] Direct Dial: [phone]

## 2024-06-11 ENCOUNTER — Inpatient Hospital Stay: Payer: Medicaid Other | Attending: Oncology

## 2024-06-11 DIAGNOSIS — D500 Iron deficiency anemia secondary to blood loss (chronic): Secondary | ICD-10-CM | POA: Diagnosis not present

## 2024-06-11 DIAGNOSIS — E538 Deficiency of other specified B group vitamins: Secondary | ICD-10-CM | POA: Insufficient documentation

## 2024-06-11 LAB — CBC WITH DIFFERENTIAL (CANCER CENTER ONLY)
Abs Immature Granulocytes: 0.02 K/uL (ref 0.00–0.07)
Basophils Absolute: 0 K/uL (ref 0.0–0.1)
Basophils Relative: 1 %
Eosinophils Absolute: 0.1 K/uL (ref 0.0–0.5)
Eosinophils Relative: 1 %
HCT: 34.9 % — ABNORMAL LOW (ref 36.0–46.0)
Hemoglobin: 11 g/dL — ABNORMAL LOW (ref 12.0–15.0)
Immature Granulocytes: 0 %
Lymphocytes Relative: 31 %
Lymphs Abs: 1.5 K/uL (ref 0.7–4.0)
MCH: 25.9 pg — ABNORMAL LOW (ref 26.0–34.0)
MCHC: 31.5 g/dL (ref 30.0–36.0)
MCV: 82.3 fL (ref 80.0–100.0)
Monocytes Absolute: 0.4 K/uL (ref 0.1–1.0)
Monocytes Relative: 7 %
Neutro Abs: 2.9 K/uL (ref 1.7–7.7)
Neutrophils Relative %: 60 %
Platelet Count: 213 K/uL (ref 150–400)
RBC: 4.24 MIL/uL (ref 3.87–5.11)
RDW: 13.3 % (ref 11.5–15.5)
WBC Count: 4.8 K/uL (ref 4.0–10.5)
nRBC: 0 % (ref 0.0–0.2)

## 2024-06-11 LAB — IRON AND TIBC
Iron: 56 ug/dL (ref 28–170)
Saturation Ratios: 16 % (ref 10.4–31.8)
TIBC: 361 ug/dL (ref 250–450)
UIBC: 305 ug/dL

## 2024-06-11 LAB — RETIC PANEL
Immature Retic Fract: 14 % (ref 2.3–15.9)
RBC.: 4.23 MIL/uL (ref 3.87–5.11)
Retic Count, Absolute: 52.9 K/uL (ref 19.0–186.0)
Retic Ct Pct: 1.3 % (ref 0.4–3.1)
Reticulocyte Hemoglobin: 31.1 pg (ref >27.9)

## 2024-06-11 LAB — FERRITIN: Ferritin: 17 ng/mL (ref 11–307)

## 2024-06-12 ENCOUNTER — Inpatient Hospital Stay: Payer: Medicaid Other

## 2024-06-12 ENCOUNTER — Inpatient Hospital Stay (HOSPITAL_BASED_OUTPATIENT_CLINIC_OR_DEPARTMENT_OTHER): Payer: Medicaid Other | Admitting: Oncology

## 2024-06-12 ENCOUNTER — Encounter: Payer: Self-pay | Admitting: Oncology

## 2024-06-12 VITALS — BP 112/78 | HR 96 | Temp 97.8°F | Resp 18 | Ht 65.0 in | Wt 262.0 lb

## 2024-06-12 DIAGNOSIS — E538 Deficiency of other specified B group vitamins: Secondary | ICD-10-CM

## 2024-06-12 DIAGNOSIS — D500 Iron deficiency anemia secondary to blood loss (chronic): Secondary | ICD-10-CM

## 2024-06-12 MED ORDER — IRON SUCROSE 20 MG/ML IV SOLN
200.0000 mg | Freq: Once | INTRAVENOUS | Status: DC
  Filled 2024-06-12: qty 10

## 2024-06-12 MED ORDER — IRON SUCROSE 20 MG/ML IV SOLN
200.0000 mg | Freq: Once | INTRAVENOUS | Status: AC
  Administered 2024-06-12: 200 mg via INTRAVENOUS
  Filled 2024-06-12: qty 10

## 2024-06-12 NOTE — Progress Notes (Signed)
 Hematology/Oncology Progress note [phone] [phone]) [phone]    Chief complaints: Follow up for iron  deficiency anemia.  Assessment & Plan: Iron  deficiency anemia due to chronic blood loss Labs are reviewed and discussed with patient. Lab Results  Component Value Date   HGB 11.0 (L) 06/11/2024   TIBC 361 06/11/2024   IRONPCTSAT 16 06/11/2024   FERRITIN 17 06/11/2024    Decreased hemoglobin with borderline iron  saturation and ferritin. Recommend IV Venofer  weekly x 2 to further improve iron  stores and hemoglobin.  Vitamin B12 deficiency Not able to tolerate  Vitamin B12 1000mcg-  Recommend patient to take multivitamin.  Plan to recheck B12 at next visit.  Consider B12 injections.    The patient understands the plans discussed today and is in agreement with them.  She knows to contact our office if she develops concerns prior to her next appointment.  Follow up in 1 year.  Zelphia Cap, MD   Orders Placed This Encounter  Procedures   CBC with Differential (Cancer Center Only)    Standing Status:   Future    Expected Date:   12/13/2024    Expiration Date:   03/13/2025   Ferritin    Standing Status:   Future    Expected Date:   12/13/2024    Expiration Date:   03/13/2025   Iron  and TIBC    Standing Status:   Future    Expected Date:   12/13/2024    Expiration Date:   03/13/2025   Retic Panel    Standing Status:   Future    Expected Date:   12/13/2024    Expiration Date:   03/13/2025   Vitamin B12    Standing Status:   Future    Expected Date:   12/13/2024    Expiration Date:   03/13/2025   Folate    Standing Status:   Future    Expected Date:   12/13/2024    Expiration Date:   03/13/2025        INTERVAL HISTORY Jasmine Gordon is a 38 y.o. female who has above history reviewed by me today presents for follow up visit for IDA and B12 deficiency  She reports feeling well.  She does not like B12 supplementation.   Review of Systems  Constitutional:   Negative for appetite change, chills, fatigue and fever.  HENT:   Negative for hearing loss and voice change.   Eyes:  Negative for eye problems.  Respiratory:  Negative for chest tightness and cough.   Cardiovascular:  Negative for chest pain.  Gastrointestinal:  Negative for abdominal distention, abdominal pain and blood in stool.  Endocrine: Negative for hot flashes.  Genitourinary:  Negative for difficulty urinating and frequency.   Musculoskeletal:  Negative for arthralgias.  Skin:  Negative for itching and rash.  Neurological:  Negative for extremity weakness.  Hematological:  Negative for adenopathy.  Psychiatric/Behavioral:  Negative for confusion.     VItal Blood pressure 112/78, pulse 96, temperature 97.8 F (36.6 C), temperature source Tympanic, resp. rate 18, height 5' 5 (1.651 m), weight 262 lb (118.8 kg), SpO2 100%.  Wt Readings from Last 3 Encounters:  06/12/24 262 lb (118.8 kg)  05/09/24 265 lb 2 oz (120.3 kg)  02/27/24 265 lb (120.2 kg)    Body mass index is 43.6 kg/m.  Physical Exam Constitutional:      General: She is not in acute distress.    Appearance: She is not diaphoretic.  HENT:  Head: Normocephalic and atraumatic.  Eyes:     General: No scleral icterus. Cardiovascular:     Rate and Rhythm: Normal rate and regular rhythm.  Pulmonary:     Effort: Pulmonary effort is normal. No respiratory distress.  Abdominal:     General: There is no distension.  Musculoskeletal:        General: Normal range of motion.     Cervical back: Normal range of motion and neck supple.  Skin:    General: Skin is warm and dry.     Findings: No erythema.  Neurological:     Mental Status: She is alert and oriented to person, place, and time. Mental status is at baseline.     Cranial Nerves: No cranial nerve deficit.     Motor: No abnormal muscle tone.  Psychiatric:        Mood and Affect: Affect normal.      Labs     Latest Ref Rng & Units 06/11/2024    7:57  AM 02/26/2024    9:01 AM 09/25/2023    8:33 AM  CBC  WBC 4.0 - 10.5 K/uL 4.8  4.3  4.7   Hemoglobin 12.0 - 15.0 g/dL 88.9  88.1  88.0   Hematocrit 36.0 - 46.0 % 34.9  37.4  37.8   Platelets 150 - 400 K/uL 213  234  272       Latest Ref Rng & Units 02/26/2024    9:01 AM 09/25/2023    8:33 AM 04/12/2023    8:25 AM  CMP  Glucose 70 - 99 mg/dL 97  90  895   BUN 6 - 20 mg/dL 9  12  9    Creatinine 0.44 - 1.00 mg/dL 9.22  9.25  9.23   Sodium 135 - 145 mmol/L 136  140  141   Potassium 3.5 - 5.1 mmol/L 3.6  3.8  3.7   Chloride 98 - 111 mmol/L 105  106  110   CO2 22 - 32 mmol/L 23  27  24    Calcium 8.9 - 10.3 mg/dL 9.1  9.4  9.2   Total Protein 6.1 - 8.1 g/dL  6.8    Total Bilirubin 0.2 - 1.2 mg/dL  0.3    AST 10 - 30 U/L  9    ALT 6 - 29 U/L  6      Lab Results  Component Value Date   TIBC 361 06/11/2024   TIBC 323 06/12/2023   TIBC 365 12/12/2022   FERRITIN 17 06/11/2024   FERRITIN 18 06/12/2023   FERRITIN 11 12/12/2022   IRONPCTSAT 16 06/11/2024   IRONPCTSAT 30 06/12/2023   IRONPCTSAT 16 12/12/2022     STUDIES:  No results found.    HISTORY:   Past Medical History:  Diagnosis Date   Allergy    Anemia    Anxiety    Asthma    HPV (human papilloma virus) infection    Migraine    Obese    Panic attack    PTSD (post-traumatic stress disorder) 2016    Past Surgical History:  Procedure Laterality Date   APPENDECTOMY     LEEP  12/2020   CIN II, negative margins   Plantar warts     WISDOM TOOTH EXTRACTION      Family History  Problem Relation Age of Onset   Alcohol abuse Mother    Drug abuse Mother    Diabetes Mother    Depression Mother    Mental retardation  Mother    Bipolar disorder Mother    Heart disease Father    Diabetes Father    Heart failure Sister    Stroke Maternal Grandfather    Diabetes Maternal Grandfather     Social History:  reports that she has never smoked. She has never been exposed to tobacco smoke. She has never used smokeless  tobacco. She reports that she does not currently use alcohol. She reports that she does not use drugs.  Allergies:  Allergies  Allergen Reactions   Reglan [Metoclopramide] Hives and Shortness Of Breath   Contrave  [Naltrexone -Bupropion  Hcl Er] Other (See Comments)    Had some reaction to the medication    Current Medications: Current Outpatient Medications  Medication Sig Dispense Refill   albuterol  (PROVENTIL ) (2.5 MG/3ML) 0.083% nebulizer solution Take 3 mLs (2.5 mg total) by nebulization every 4 (four) hours as needed for wheezing or shortness of breath. 75 mL 2   albuterol  (VENTOLIN  HFA) 108 (90 Base) MCG/ACT inhaler Inhale 2 puffs into the lungs every 6 (six) hours as needed for wheezing or shortness of breath. 18 g 5   ASHWAGANDHA GUMMIES PO Take by mouth.     cetirizine  (ZYRTEC ) 10 MG tablet Take 10 mg by mouth daily as needed for allergies.     cholecalciferol (VITAMIN D3) 25 MCG (1000 UNIT) tablet Take 1,000 Units by mouth daily.     fluticasone  (FLONASE ) 50 MCG/ACT nasal spray Place 1 spray into both nostrils daily. Use for 4-6 weeks then stop and use seasonally or as needed. 16 g 0   hydrOXYzine  (ATARAX ) 10 MG tablet Take 1 tablet (10 mg total) by mouth 3 (three) times daily as needed for anxiety. 60 tablet 3   montelukast  (SINGULAIR ) 10 MG tablet Take 10 mg by mouth at bedtime as needed.     Multiple Vitamins-Minerals (ONE-A-DAY WOMENS PO) Take 1 tablet by mouth daily.     Spacer/Aero-Holding Chambers (AEROCHAMBER MV) inhaler Use as instructed 1 each 2   WEGOVY  0.25 MG/0.5ML SOAJ Inject 0.25 mg into the skin once a week.     No current facility-administered medications for this visit.

## 2024-06-12 NOTE — Assessment & Plan Note (Addendum)
 Labs are reviewed and discussed with patient. Lab Results  Component Value Date   HGB 11.0 (L) 06/11/2024   TIBC 361 06/11/2024   IRONPCTSAT 16 06/11/2024   FERRITIN 17 06/11/2024    Decreased hemoglobin with borderline iron  saturation and ferritin. Recommend IV Venofer  weekly x 2 to further improve iron  stores and hemoglobin.

## 2024-06-12 NOTE — Assessment & Plan Note (Addendum)
 Not able to tolerate  Vitamin B12 1000mcg-  Recommend patient to take multivitamin.  Plan to recheck B12 at next visit.  Consider B12 injections.

## 2024-06-12 NOTE — Progress Notes (Signed)
 Patient is doing ok, no new questions for the doctor today.

## 2024-06-18 ENCOUNTER — Other Ambulatory Visit: Payer: Self-pay | Admitting: Licensed Clinical Social Worker

## 2024-06-18 NOTE — Patient Outreach (Signed)
 Complex Care Management   Visit Note  06/18/2024  Name:  Jasmine Gordon MRN: [id number] DOB: 10-12-1986  Situation: Referral received for Complex Care Management related to Mental/Behavioral Health diagnosis depression. I obtained verbal consent from Patient.  Visit completed with patient and VBCI LCSW  on the phone  Background:   Past Medical History:  Diagnosis Date   Allergy    Anemia    Anxiety    Asthma    HPV (human papilloma virus) infection    Migraine    Obese    Panic attack    PTSD (post-traumatic stress disorder) 2016    Assessment: Patient Reported Symptoms:  Cognitive Cognitive Status: Able to follow simple commands, Normal speech and language skills, Alert and oriented to person, place, and time      Neurological Neurological Review of Symptoms: No symptoms reported    HEENT HEENT Symptoms Reported: No symptoms reported      Respiratory Respiratory Symptoms Reported: No symptoms reported    Psychosocial Psychosocial Symptoms Reported: Sadness - if selected complete PHQ 2-9 Additional Psychological Details: Pt reports that she started therapy at [facility] and is going weekly but that this therapist has already left and she is waiting for a new one to arrive and start- Update on this new transition as of 06/18/24. She reports that her stress levels are better since she returned back to work at [employer] and as she has relocated out of the home with her spouse due to conflict. She is staying positive and utilizing her coping skills to promote healthy living. Emotional support provided. Patient was updated that she was successfully placed on wait list for therapy at [facility] and that PCP was made aware as well. Behavioral Management Strategies: Coping strategies, Medication therapy Behavioral Health Self-Management Outcome: 4 (good) Major Change/Loss/Stressor/Fears (CP): Relationship concerns Quality of Family Relationships: non-existent Do you feel  physically threatened by others?: No      06/18/2024    3:29 PM  Depression screen PHQ 2/9  Decreased Interest 1  Down, Depressed, Hopeless 1  PHQ - 2 Score 2  Altered sleeping 1  Tired, decreased energy 1  Change in appetite 1  Feeling bad or failure about yourself  1  Trouble concentrating 2  Moving slowly or fidgety/restless 0  Suicidal thoughts 0  PHQ-9 Score 8  Difficult doing work/chores Somewhat difficult    There were no vitals filed for this visit.  Medications Reviewed Today     Reviewed by Merlynn Lyle CROME, LCSW (Social Worker) on 06/18/24 at 1544  Med List Status: <None>   Medication Order Taking? Sig Documenting Provider Last Dose Status Informant  albuterol  (PROVENTIL ) (2.5 MG/3ML) 0.083% nebulizer solution [id number]  Take 3 mLs (2.5 mg total) by nebulization every 4 (four) hours as needed for wheezing or shortness of breath. Karamalegos, Marsa PARAS, DO  Active   albuterol  (VENTOLIN  HFA) 108 [phone] Base) MCG/ACT inhaler [id number]  Inhale 2 puffs into the lungs every 6 (six) hours as needed for wheezing or shortness of breath. Edman Marsa PARAS, DO  Active   ASHWAGANDHA GUMMIES PO [id number]  Take by mouth. [provider]  Active   cetirizine  (ZYRTEC ) 10 MG tablet [id number]  Take 10 mg by mouth daily as needed for allergies. [provider]  Active   cholecalciferol (VITAMIN D3) 25 MCG (1000 UNIT) tablet [id number]  Take 1,000 Units by mouth daily. [provider]  Active   fluticasone  (FLONASE ) 50 MCG/ACT nasal spray [id number]  Place 1 spray into both nostrils daily. Use for 4-6 weeks then stop and use seasonally or as needed. Edman Marsa PARAS, DO  Active   hydrOXYzine  (ATARAX ) 10 MG tablet [id number]  Take 1 tablet (10 mg total) by mouth 3 (three) times daily as needed for anxiety. Karamalegos, Marsa PARAS, DO  Active   montelukast  (SINGULAIR ) 10 MG tablet [id number]  Take 10 mg by mouth at bedtime as needed. [provider]   Active   Multiple Vitamins-Minerals (ONE-A-DAY WOMENS PO) [id number]  Take 1 tablet by mouth daily. [provider]  Active   Spacer/Aero-Holding Chambers (AEROCHAMBER MV) inhaler [id number]  Use as instructed Edman Marsa PARAS, DO  Active   WEGOVY  0.25 MG/0.5ML EMMANUEL [id number]  Inject 0.25 mg into the skin once a week. Edman Marsa PARAS, DO  Active            Recommendation:   PCP Follow-up Specialty provider follow-up [facility]  Follow Up Plan:   Telephone follow-up in 1 month  Lyle Rung, BSW, MSW, LCSW Licensed Clinical Social Worker [employer]   [email or web address] Direct Dial: [phone]

## 2024-06-18 NOTE — Patient Instructions (Addendum)
 Visit Information  Thank you for taking time to visit with me today. Please don't hesitate to contact me if I can be of assistance to you before our next scheduled appointment.  Your next care management appointment is by telephone on 07/10/23 at 330 pm  Please call the care guide team at [phone] if you need to cancel, schedule, or reschedule an appointment.   Please call the Suicide and Crisis Lifeline: 988 call the [facility]: 6047741830 if you are experiencing a Mental Health or Behavioral Health Crisis or need someone to talk to.  Lyle Rung, BSW, MSW, LCSW Licensed Clinical Social Worker [employer] [facility]   [facility] [email or web address] Direct Dial: [phone]

## 2024-06-24 ENCOUNTER — Other Ambulatory Visit: Payer: Self-pay

## 2024-06-24 NOTE — Patient Instructions (Signed)
 Visit Information  [name] was given information about Medicaid Managed Care team care coordination services as a part of their Healthy [facility] Medicaid benefit. Jasmine Gordon   If you would like to schedule transportation through your Healthy [facility] plan, please call the following number at least 2 days in advance of your appointment: [phone]  For information about your ride after you set it up, call Ride Assist at [phone]. Use this number to activate a Will Call pickup, or if your transportation is late for a scheduled pickup. Use this number, too, if you need to make a change or cancel a previously scheduled reservation.  If you need transportation services right away, call [phone]. The after-hours call center is staffed 24 hours to handle ride assistance and urgent reservation requests (including discharges) 365 days a year. Urgent trips include sick visits, hospital discharge requests and life-sustaining treatment.  Call the [facility] Line at [phone], at any time, 24 hours a day, 7 days a week. If you are in danger or need immediate medical attention call 911.   [name] following are the goals we discussed in your visit today:   Goals Addressed             This Visit's Progress    COMPLETED: BSW VBCI Social Work Care Plan       Problems:   Housing   CSW Clinical Goal(s):   Over the next 30 days the Patient will work with Child psychotherapist to address concerns related to housing/utilties.  Interventions:  Social Determinants of Health in Patient with housing and utilities assistance: SDOH assessments completed: Financial Strain  and Housing  Evaluation of current treatment plan related to unmet needs SW will resent resources for assistance with utilties    Patient Goals/Self-Care Activities:  Patient will utilize resources emailed to her  Plan:   The patient has been provided with contact information for the  care management team and has been advised to call with any health related questions or concerns.          The  Patient                                              has been provided with contact information for the Managed Medicaid care management team and has been advised to call with any health related questions or concerns.   Thersia Gordon, HEDWIG, MHA [place]  [facility] Social Worker, Population Health [phone]   Following is a copy of your plan of care:  There are no care plans that you recently modified to display for this patient.

## 2024-06-24 NOTE — Patient Outreach (Signed)
 Complex Care Management   Visit Note  06/24/2024  Name:  Jasmine Gordon MRN: [id number] DOB: Jan 25, 1986  Situation: Referral received for Complex Care Management related to SDOH Barriers:  Lack of essential utilities assistance with deposit I obtained verbal consent from Patient.  Visit completed with patient  on the phone  Background:   Past Medical History:  Diagnosis Date   Allergy    Anemia    Anxiety    Asthma    HPV (human papilloma virus) infection    Migraine    Obese    Panic attack    PTSD (post-traumatic stress disorder) 2016    Assessment: SW completed a telephone outreach with patient, she stated she was at work and busy. Patient states she was able to get moved into her new place and no resources are needed at this time.  SDOH Interventions    Flowsheet Row Patient Outreach Telephone from 06/18/2024 in [place] POPULATION HEALTH DEPARTMENT Patient Outreach Telephone from 05/28/2024 in [place] POPULATION HEALTH DEPARTMENT Patient Outreach Telephone from 05/17/2024 in [place] POPULATION HEALTH DEPARTMENT Patient Outreach Telephone from 05/01/2024 in [place] POPULATION HEALTH DEPARTMENT Patient Outreach Telephone from 04/22/2024 in [place] POPULATION HEALTH DEPARTMENT Patient Outreach Telephone from 04/18/2024 in [place] POPULATION HEALTH DEPARTMENT  SDOH Interventions        Food Insecurity Interventions Community Resources Provided  [EBT started on 05/28/24, food resources provided by [employer] SW team several times and pt was reminded to revert back to this information or to print this out for quick reference] [facility] Provided  [EBT enrolled as of 05/28/34] [facility] Provided, AMB Referral  [Pt will apply for EBT today] Intervention Not Indicated -- --  Housing Interventions AMB Referral, Programmer, applications Provided -- [employer] Provided, AMB Referral Intervention Not Indicated, Other (Comment)  [However, pt wishes to relocate  from her current living situation/environment] -- --  Transportation Interventions Intervention Not Indicated Intervention Not Indicated -- Intervention Not Indicated -- --  Utilities Interventions -- -- -- Intervention Not Indicated -- --  Depression Interventions/Treatment  Community Resources Provided, Data processing manager Provided [employer] Provided  [employer] Provided Medication  [employer] outreach with [employer] on April 30, 2024. Agreeable to establishing care with local counselor. Collaborated with PCP. Will discuss options for medications for better symptom management during her appointment on May 06, 2024.] Medication  Financial Strain Interventions -- [employer] Provided [facility] Provided, Other (Comment)  [VBCI BSW REFERRAL] [employer] Provided  [employer] Review completed] -- --  Stress Interventions Provide Counseling, Data processing manager Provided, Education officer, environmental Provided, [employer]  [phone] crisis resource provided] [employer] Provided -- --      Recommendation:   No recommendations at this time.  Follow Up Plan:   Patient has met all care management goals. Care Management case will be closed. Patient has been provided contact information should new needs arise.   Thersia Hoar, HEDWIG, MHA [place]  [facility] Social Worker, Population Health [phone]

## 2024-06-26 ENCOUNTER — Inpatient Hospital Stay: Attending: Oncology

## 2024-07-01 ENCOUNTER — Encounter: Payer: Self-pay | Admitting: Family Medicine

## 2024-07-01 DIAGNOSIS — I10 Essential (primary) hypertension: Secondary | ICD-10-CM

## 2024-07-03 ENCOUNTER — Other Ambulatory Visit: Payer: Self-pay

## 2024-07-05 NOTE — Patient Instructions (Signed)
 Thank you for allowing the Complex Care Management team to participate in your care. It was great meeting with you!  You're doing an amazing job Armed forces logistics/support/administrative officer and your family! Please show your some grace and do not hesitate to reach out if your care needs change.  Visit Information  Ms. Obie Silos was given information about Medicaid Managed Care team care coordination services as a part of their Healthy [facility] Medicaid benefit. [place] Evola Hollis   If you would like to schedule transportation through your Healthy [facility] plan, please call the following number at least 2 days in advance of your appointment: [phone]  For information about your ride after you set it up, call Ride Assist at [phone]. Use this number to activate a Will Call pickup, or if your transportation is late for a scheduled pickup. Use this number, too, if you need to make a change or cancel a previously scheduled reservation.  If you need transportation services right away, call [phone]. The after-hours call center is staffed 24 hours to handle ride assistance and urgent reservation requests (including discharges) 365 days a year. Urgent trips include sick visits, hospital discharge requests and life-sustaining treatment.  Call the [facility] Line at [phone], at any time, 24 hours a day, 7 days a week. If you are in danger or need immediate medical attention call 911.   Ms. Jetaun Colbath - following are the goals we discussed in your visit today:   Goals Addressed             This Visit's Progress    VBCI RNCM:  Monitor, Self-Manage And Reduce Symptoms of Asthma       Problems:  Chronic Disease Management support and education needs related to Asthma   Goal: Over the next 3 months the patient will: Attend all scheduled medical appointments Take all medications exactly as prescribed  Demonstrate ongoing adherence to prescribed treatment plan for Asthma Not experience  hospital admission for complications related to Asthma  Interventions:  Asthma:(Status: Goal on track  ) Long Term Goal  Reviewed plan for management of Asthma. Reviewed medications. Reports taking medications and using inhalers as prescribed. Reviewed symptoms. Reports an episode of Covid earlier this month. Reports currently doing well. Denies episodes of of shortness of breath or wheezing. Reports activity tolerance has returned to baseline. Advised to continue taking needed precautions to prevent exacerbation.  Reviewed current action plan and reinforced importance of daily self-assessment. Advised to make an appointment with provider if experiencing moderate symptoms for greater than 48 hours without improvement.  Provided information regarding infection prevention and increased risk related to Asthma. Advised to utilize prevention strategies to reduce risk of respiratory infection  Reviewed worsening symptoms that require immediate medical attention.    Patient Self-Care Activities:  Complete appointments with the medical team as scheduled Take medications and use inhalers as prescribed Assess symptoms daily Notify provider if in the yellow zone for 48 hrs without improvement Follow recommendations to prevent respiratory infection Notify provider or care management team with questions and new concerns as needed  Plan:  Telephone follow up appointment August 09, 2024.        VBCI RNCM:  Monitor, Self-Manage And Reduce Symptoms of HLD       Problems:  Chronic Disease Management support and education needs related to Hyperlipidemia  Goal: Over the next 3 months the patient will: Attend all scheduled medical appointments Demonstrate ongoing adherence to prescribed treatment plan for HLD  Interventions:  Hyperlipidemia Interventions:(Status: Goal on track:  Yes.) Long Term Goal  Reviewed established cholesterol goals  Discussed importance of regular laboratory monitoring as  prescribed Provided HLD educational materials Reviewed importance of limiting foods high in cholesterol Reviewed exercise goals. Reports attempting to walk 2 to 3 miles in the morning.  Screening for signs and symptoms of depression related to chronic disease state.  Thorough discussion regarding anxiety, depression and impact on overall health. She has relocated with her children but notes spouse is not providing support. She will follow up with the LCSW on July 09, 2024. Reviewed signs and symptoms of heart attack, stroke and indications for seeking immediate medical attention.  Lab Results  Component Value Date   CHOL 220 (H) 09/25/2023   HDL 46 (L) 09/25/2023   LDLCALC 155 (H) 09/25/2023   TRIG 87 09/25/2023   CHOLHDL 4.8 09/25/2023      Patient Self-Care Activities:  Complete labs as ordered Attend all scheduled provider appointments Continue consistent exercise Read nutrition labels. Eat whole grains, fruits and vegetables, lean meats and healthy fats Limit intake of sodium and cholesterol Call provider office for new concerns or questions   Plan:  Telephone follow up appointment with August 09, 2024           Please see education materials related to Asthma provided by MyChart link.  Patient verbalizes understanding of instructions and care plan provided today and agrees to view in MyChart. Active MyChart status and patient understanding of how to access instructions and care plan via MyChart confirmed with patient.     RN Care Manager will follow up on August 09, 2024.   Jackson Acron [facility] Population Health RN Care Manager Direct Dial: [phone]  Fax: [phone] Website: [email or web address]

## 2024-07-05 NOTE — Patient Outreach (Signed)
 Complex Care Management   Visit Note    Name:  Jasmine Gordon MRN: [id number] DOB: 1986/04/25  Situation: Referral received for Complex Care Management related to Asthma, Hyperlipidemia, Anxiety and Depression. I obtained verbal consent from Patient.  Visit completed with Jasmine Gordon today.  Background:   Past Medical History:  Diagnosis Date   Allergy    Anemia    Anxiety    Asthma    HPV (human papilloma virus) infection    Migraine    Obese    Panic attack    PTSD (post-traumatic stress disorder) 2016       Assessment: Patient Reported Symptoms: Cognitive Cognitive Status: Alert and oriented to person, place, and time, Normal speech and language skills Cognitive/Intellectual Conditions Management [RPT]: None reported or documented in medical history or problem list Health Maintenance Behaviors: Annual physical exam, Exercise, Stress management Healing Pattern: Average Health Facilitated by: Prayer/meditation, Rest, Stress management  Neurological Neurological Review of Symptoms: No symptoms reported Neurological Management Strategies: Routine screening Neurological Self-Management Outcome: 4 (good)  HEENT HEENT Symptoms Reported: No symptoms reported HEENT Management Strategies: Routine screening  Cardiovascular Cardiovascular Symptoms Reported: No symptoms reported Does patient have uncontrolled Hypertension?: No Cardiovascular Management Strategies: Exercise, Medication therapy, Routine screening Cardiovascular Self-Management Outcome: 4 (good)  Respiratory Respiratory Symptoms Reported: No symptoms reported  Endocrine Endocrine Symptoms Reported: No symptoms reported Is patient diabetic?: No Endocrine Self-Management Outcome: 4 (good)  Gastrointestinal Gastrointestinal Symptoms Reported: Other Other Gastrointestinal Symptoms: Obesity. Currently working towards weight loss goals. Gastrointestinal Management Strategies: Exercise, Diet  modification Gastrointestinal Self-Management Outcome: 4 (good)  Genitourinary Genitourinary Symptoms Reported: No symptoms reported Genitourinary Self-Management Outcome: 4 (good)  Integumentary Integumentary Symptoms Reported: No symptoms reported Skin Management Strategies: Routine screening, Medication therapy Skin Self-Management Outcome: 4 (good)  Musculoskeletal Musculoskelatal Symptoms Reviewed: No symptoms reported Musculoskeletal Management Strategies: Routine screening Musculoskeletal Self-Management Outcome: 4 (good)  Psychosocial Psychosocial Symptoms Reported: Anxiety - if selected complete GAD, Depression - if selected complete PHQ 2-9 Additional Psychological Details: Reports anxiety due to financial concerns and lack of support from her spouse. She remains very efficient with managing available resources and coping skills. Reports pending start of outreach with a new therapist. Remains engaged with the embedded LCSW. Behavioral Management Strategies: Coping strategies, Medication therapy, Exercise (Counseling pending) Behavioral Health Self-Management Outcome: 4 (good) Major Change/Loss/Stressor/Fears (CP): Resources, Relationship concerns Behaviors When Feeling Stressed/Fearful: Rest, Withdraw Techniques to Cope with Loss/Stress/Change: Diversional activities, Exercise, Medication, Withdraw, Spiritual practice(s) Quality of Family Relationships: non-existent Do you feel physically threatened by others?: No      07/03/2024    4:39 PM  Depression screen PHQ 2/9  Decreased Interest 1  Down, Depressed, Hopeless 1  PHQ - 2 Score 2  Altered sleeping 1  Tired, decreased energy 1  Change in appetite 1  Feeling bad or failure about yourself  1  Trouble concentrating 1  Moving slowly or fidgety/restless 0  Suicidal thoughts 0  PHQ-9 Score 7  Difficult doing work/chores Somewhat difficult    There were no vitals filed for this visit.  Medications Reviewed Today      Reviewed by Karoline Lima, RN (Registered Nurse) on 07/05/24 at 0920  Med List Status: <None>   Medication Order Taking? Sig Documenting Provider Last Dose Status Informant  albuterol  (PROVENTIL ) (2.5 MG/3ML) 0.083% nebulizer solution [id number]  Take 3 mLs (2.5 mg total) by nebulization every 4 (four) hours as needed for wheezing or shortness of breath. Edman Marsa PARAS, DO  Active  albuterol  (VENTOLIN  HFA) 108 (90 Base) MCG/ACT inhaler [id number]  Inhale 2 puffs into the lungs every 6 (six) hours as needed for wheezing or shortness of breath. Edman Marsa PARAS, DO  Active   ASHWAGANDHA GUMMIES PO [id number]  Take by mouth. [provider]  Active   cetirizine  (ZYRTEC ) 10 MG tablet [id number]  Take 10 mg by mouth daily as needed for allergies. [provider]  Active   cholecalciferol (VITAMIN D3) 25 MCG (1000 UNIT) tablet [id number]  Take 1,000 Units by mouth daily. [provider]  Active   fluticasone  (FLONASE ) 50 MCG/ACT nasal spray [id number]  Place 1 spray into both nostrils daily. Use for 4-6 weeks then stop and use seasonally or as needed. Edman Marsa PARAS, DO  Active   hydrOXYzine  (ATARAX ) 10 MG tablet [id number]  Take 1 tablet (10 mg total) by mouth 3 (three) times daily as needed for anxiety. Edman Marsa PARAS, DO  Active   montelukast  (SINGULAIR ) 10 MG tablet [id number]  Take 10 mg by mouth at bedtime as needed. [provider]  Active   Multiple Vitamins-Minerals (ONE-A-DAY WOMENS PO) [id number]  Take 1 tablet by mouth daily. [provider]  Active   Spacer/Aero-Holding Chambers (AEROCHAMBER MV) inhaler [id number]  Use as instructed Edman Marsa PARAS, DO  Active   WEGOVY  0.25 MG/0.5ML EMMANUEL [id number]  Inject 0.25 mg into the skin once a week. Edman Marsa PARAS, DO  Active             Recommendation:   Continue Current Plan of Care  Follow Up Plan:   Telephone follow up appointment with Nurse Case  Manager on August 09, 2024   Jackson Acron [facility] RN Care Manager Direct Dial: [phone]  Fax: [phone] Website: [email or web address]

## 2024-07-09 ENCOUNTER — Other Ambulatory Visit: Payer: Self-pay | Admitting: Licensed Clinical Social Worker

## 2024-07-10 NOTE — Patient Instructions (Addendum)
 Visit Information  Thank you for taking time to visit with me today. Please don't hesitate to contact me if I can be of assistance to you before our next scheduled appointment.  Our next appointment is by telephone on 08/12/24 at 330 pm Please call the care guide team at [phone] if you need to cancel or reschedule your appointment.   Following is a copy of your care plan:   Goals Addressed             This Visit's Progress    LCSW V[facility] Social Work Care Plan       Problems:   Family and relationship dysfunction, Lacks knowledge of how to connect , and Depression  , Financial Strain , and Stress   Current barriers:   Chronic Mental Health needs related to anxiety, panic and stress management  Mental Health Concerns  and Social Isolation Ongoing relationship stress with spouse. Needs Support, Education, and Care Coordination in order to meet unmet mental health needs.   CSW Clinical Goal(s): verbalize understanding of plan for management of Anxiety, Panic and Stress     Clinical Interventions:  Assessed patient's previous and current treatment, coping skills, support system and barriers to care. Patient provided hx  Verbalization of feelings encouraged, motivational interviewing employed Emotional support provided, positive coping strategies explored Self care/establishing healthy boundaries emphasized Patient reports that her anxiety has increased which has affected her ability to carry out work and function daily.  Patient reports having a limited support network. Patient reports that she experiences both anxiety and panic attacks. She shares that she felt alone most days. She reports talking about her feelings with [employer]. 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 She was receptive to anxiety and depression management coping skill education. LCSW provided education on relaxation techniques such as meditation, deep  breathing, massage, grounding exercsies or yoga that can activate the body's relaxation response and ease symptoms of stress and anxiety. LCSW ask that when pt is struggling with difficult emotions and racing thoughts that they start this relaxation response process. LCSW provided extensive education on healthy coping skills for anxiety. SW used active and reflective listening, validated patient's feelings/concerns, and provided emotional support. Motivational Interviewing employed Depression screen reviewed  PHQ2/ PHQ9 and GAD completed Mindfulness or Relaxation training provided Active listening / Reflection utilized  Emotional Support Provided Problem Solving /Task Center strategies reviewed Provided psychoeducation for mental health needs  Provided brief CBT  Reviewed mental health medications and discussed importance of compliance:  Quality of sleep assessed & Sleep Hygiene techniques promoted  Participation in counseling encouraged  Verbalization of feelings encouraged  Suicidal Ideation/Homicidal Ideation assessed: Patient denies SI/HI  Pt reports that she started weekly therapy at the [facility] but that her therapist there has already left for a new job and she is waiting for a new therapist. Agency was able to help her with rent for July since she relocated out of her residence and ended relationship with spouse. Patient reports that her mood management has improved somewhat. She denies any current crises and is aware of where to go and who to call if one were to occur. Emotional support provided. [facility], [employer] and [employer] resources were provided to patient. Email was sent to her as well with a complete list of mental health resources in her area that provide the services she is interested in. Patient reports that she is still waiting to get updated  on when her new set counselor at [facility] will start. Patient was encouraged to contact agency to follow up or to  consider going to the walk in clinic at [facility]. On 07/09/24, patient remembered that she had a past negative experience at [facility] and prefers a referral elsewhere. Referral placed on 07/10/24 for [facility].          Please call the Suicide and Crisis Lifeline: 988 call the [place]  National Suicide Prevention Lifeline: (747)572-9015 or TTY: 956-382-1391 TTY 778-001-1411) to talk to a trained counselor call 1-800-273-TALK (toll free, 24 hour hotline) go to [facility] [address], [place] [phone]) if you are experiencing a Mental Health or Behavioral Health Crisis or need someone to talk to.  Patient verbalizes understanding of instructions and care plan provided today and agrees to view in MyChart. Active MyChart status and patient understanding of how to access instructions and care plan via MyChart confirmed with patient.     Lyle Rung, BSW, MSW, LCSW Licensed Clinical Social Worker [employer]   [facility] [email or web address] Direct Dial: [phone]

## 2024-07-10 NOTE — Patient Outreach (Signed)
 Complex Care Management   Visit Note  07/09/2024  Name:  Jasmine Gordon MRN: [id number] DOB: October 08, 1986  Situation: Referral received for Complex Care Management related to Mental/Behavioral Health diagnosis Depression. I obtained verbal consent from Patient.  Visit completed with Patient  on the phone  Background:   Past Medical History:  Diagnosis Date   Allergy    Anemia    Anxiety    Asthma    HPV (human papilloma virus) infection    Migraine    Obese    Panic attack    PTSD (post-traumatic stress disorder) 2016    Assessment: Patient Reported Symptoms:  Cognitive Cognitive Status: Able to follow simple commands, Alert and oriented to person, place, and time Cognitive/Intellectual Conditions Management [RPT]: None reported or documented in medical history or problem list   Health Maintenance Behaviors: Annual physical exam, Stress management, Exercise Healing Pattern: Average Health Facilitated by: Stress management  Psychosocial Psychosocial Symptoms Reported: Anxiety - if selected complete GAD          07/10/2024   10:34 AM 07/03/2024    4:40 PM 05/17/2024    2:27 PM 05/09/2024    8:15 AM  GAD 7 : Generalized Anxiety Score  Nervous, Anxious, on Edge 1 1 2 2   Control/stop worrying 1 1 2 2   Worry too much - different things 1 1 2 2   Trouble relaxing 1 1 3 2   Restless 1 1 1 1   Easily annoyed or irritable 2 2 2 2   Afraid - awful might happen 2 1 2 3   Total GAD 7 Score 9 8 14 14   Anxiety Difficulty Somewhat difficult Somewhat difficult Very difficult Very difficult       There were no vitals filed for this visit.  Medications Reviewed Today     Reviewed by Merlynn Lyle CROME, LCSW (Social Worker) on 07/10/24 at 1043  Med List Status: <None>   Medication Order Taking? Sig Documenting Provider Last Dose Status Informant  albuterol  (PROVENTIL ) (2.5 MG/3ML) 0.083% nebulizer solution [id number]  Take 3 mLs (2.5 mg total) by nebulization every 4 (four) hours as  needed for wheezing or shortness of breath. Edman Marsa PARAS, DO  Active   albuterol  (VENTOLIN  HFA) 108 (90 Base) MCG/ACT inhaler [id number]  Inhale 2 puffs into the lungs every 6 (six) hours as needed for wheezing or shortness of breath. Edman Marsa PARAS, DO  Active   ASHWAGANDHA GUMMIES PO [id number]  Take by mouth. [provider]  Active   cetirizine  (ZYRTEC ) 10 MG tablet [id number]  Take 10 mg by mouth daily as needed for allergies. [provider]  Active   cholecalciferol (VITAMIN D3) 25 MCG (1000 UNIT) tablet [id number]  Take 1,000 Units by mouth daily. [provider]  Active   fluticasone  (FLONASE ) 50 MCG/ACT nasal spray [id number]  Place 1 spray into both nostrils daily. Use for 4-6 weeks then stop and use seasonally or as needed. Karamalegos, Marsa PARAS, DO  Active   hydrOXYzine  (ATARAX ) 10 MG tablet [id number]  Take 1 tablet (10 mg total) by mouth 3 (three) times daily as needed for anxiety. Karamalegos, Marsa PARAS, DO  Active   montelukast  (SINGULAIR ) 10 MG tablet [id number]  Take 10 mg by mouth at bedtime as needed. [provider]  Active   Multiple Vitamins-Minerals (ONE-A-DAY WOMENS PO) [id number]  Take 1 tablet by mouth daily. [provider]  Active   Spacer/Aero-Holding Chambers (AEROCHAMBER MV) inhaler [id number]  Use as instructed Edman Marsa PARAS, DO  Active   WEGOVY  0.25 MG/0.5ML SOAJ [id number]  Inject 0.25 mg into the skin once a week. Edman Marsa PARAS, DO  Active             Recommendation:   PCP Follow-up Specialty provider follow-up [facility]  Continue Current Plan of Care  Follow Up Plan:   Telephone follow-up in 1 month  Lyle Rung, BSW, MSW, LCSW Licensed Clinical Social Worker [employer]   [email or web address] Direct Dial: [phone]

## 2024-07-14 ENCOUNTER — Encounter: Payer: Self-pay | Admitting: Obstetrics and Gynecology

## 2024-07-25 ENCOUNTER — Ambulatory Visit
Admission: EM | Admit: 2024-07-25 | Discharge: 2024-07-25 | Disposition: A | Discharge status: Home / Self Care | Attending: Family Medicine | Admitting: Family Medicine

## 2024-07-25 ENCOUNTER — Encounter: Payer: Self-pay | Admitting: Family Medicine

## 2024-07-25 DIAGNOSIS — R102 Pelvic and perineal pain: Secondary | ICD-10-CM | POA: Diagnosis not present

## 2024-07-25 DIAGNOSIS — N3001 Acute cystitis with hematuria: Secondary | ICD-10-CM | POA: Diagnosis not present

## 2024-07-25 DIAGNOSIS — L304 Erythema intertrigo: Secondary | ICD-10-CM | POA: Insufficient documentation

## 2024-07-25 DIAGNOSIS — I10 Essential (primary) hypertension: Secondary | ICD-10-CM

## 2024-07-25 DIAGNOSIS — Z113 Encounter for screening for infections with a predominantly sexual mode of transmission: Secondary | ICD-10-CM | POA: Diagnosis not present

## 2024-07-25 LAB — URINALYSIS, W/ REFLEX TO CULTURE (INFECTION SUSPECTED)
Bilirubin Urine: NEGATIVE
Glucose, UA: NEGATIVE mg/dL
Ketones, ur: NEGATIVE mg/dL
Nitrite: NEGATIVE
Protein, ur: NEGATIVE mg/dL
Specific Gravity, Urine: 1.02 (ref 1.005–1.030)
pH: 5.5 (ref 5.0–8.0)

## 2024-07-25 LAB — HIV ANTIBODY (ROUTINE TESTING W REFLEX): HIV Screen 4th Generation wRfx: NONREACTIVE

## 2024-07-25 LAB — PREGNANCY, URINE: Preg Test, Ur: NEGATIVE

## 2024-07-25 MED ORDER — FLUCONAZOLE 150 MG PO TABS: 150.0000 mg | ORAL_TABLET | Freq: Once | ORAL | 0 refills | Status: AC

## 2024-07-25 MED ORDER — KETOCONAZOLE 2 % EX CREA: 1.0000 | TOPICAL_CREAM | Freq: Every day | CUTANEOUS | 0 refills | Status: AC

## 2024-07-25 MED ORDER — NITROFURANTOIN MONOHYD MACRO 100 MG PO CAPS: 100.0000 mg | ORAL_CAPSULE | Freq: Two times a day (BID) | ORAL | 0 refills | Status: DC

## 2024-07-25 MED ORDER — BLOOD PRESSURE CUFF MISC: 0 refills | Status: DC

## 2024-07-25 NOTE — ED Provider Notes (Signed)
 [facility]    CSN: [id number] Arrival date & time: 07/25/24  0825      History   Chief Complaint Chief Complaint  Patient presents with   Rash    HPI Jasmine Gordon is a 38 y.o. female presents for rash and dysuria.  Patient reports she had COVID a couple weeks ago that resulted in her sweating a lot.  States she developed a rash under her breast and around her groin.  States she did follow-up with her PCP who advised she take over-the-counter clotrimazole  topically.  She also sent her a dose of Diflucan  which the patient has not yet picked up.  She states the area under her breast has resolved but her groin remains better but is still itchy.  No drainage or fevers.  In addition she reports 2 days of suprapubic pressure and is concerned for UTI.  Denies dysuria, fevers, nausea/vomiting or flank pain.  No vaginal discharge or STD concern but she would like screening for STDs while she is here as well.  No other concerns at this time.   Rash   Past Medical History:  Diagnosis Date   Allergy    Anemia    Anxiety    Asthma    HPV (human papilloma virus) infection    Migraine    Obese    Panic attack    PTSD (post-traumatic stress disorder) 2016    Patient Active Problem List   Diagnosis Date Noted   Latent tuberculosis 06/14/2023   Dysplasia of cervix, high grade CIN 2 02/23/2023   High grade squamous intraepithelial lesion (HGSIL), grade 3 CIN, on biopsy of cervix 02/23/2023   Vitamin B12 deficiency 06/09/2022   Palpitations 01/21/2022   Shortness of breath 01/21/2022   Chest pain 01/21/2022   Generalized anxiety disorder with panic attacks 09/27/2021   Environmental and seasonal allergies 11/04/2020   Genital warts 09/11/2018   Positive TB test 09/25/2017   Allergic contact dermatitis due to metals 05/17/2017   Pre-diabetes 03/06/2017   Hyperlipidemia 03/06/2017   Allergic reaction 02/08/2017   Urticaria due to food allergy 02/08/2017   Vitamin D   deficiency 11/08/2016   Iron  deficiency anemia due to chronic blood loss 10/03/2016   Menorrhagia with regular cycle 06/27/2016   Mild persistent asthma without complication 06/17/2016   Anxiety and depression 06/17/2016   Morbid obesity with BMI of 40.0-44.9, adult (HCC) 06/17/2016   PTSD (post-traumatic stress disorder) 2016    Past Surgical History:  Procedure Laterality Date   APPENDECTOMY     LEEP  12/2020   CIN II, negative margins   Plantar warts     WISDOM TOOTH EXTRACTION      OB History     Gravida  3   Para  3   Term  3   Preterm      AB      Living  3      SAB      IAB      Ectopic      Multiple      Live Births  3            Home Medications    Prior to Admission medications   Medication Sig Start Date End Date Taking? Authorizing Provider  ketoconazole  (NIZORAL ) 2 % cream Apply 1 Application topically daily. 07/25/24  Yes Loreda Myla SAUNDERS, NP  nitrofurantoin , macrocrystal-monohydrate, (MACROBID ) 100 MG capsule Take 1 capsule (100 mg total) by mouth 2 (two) times daily. 07/25/24  Yes Challen Spainhour, Jodi R, NP  albuterol  (PROVENTIL ) (2.5 MG/3ML) 0.083% nebulizer solution Take 3 mLs (2.5 mg total) by nebulization every 4 (four) hours as needed for wheezing or shortness of breath. 03/18/24 03/18/25  Karamalegos, Marsa PARAS, DO  albuterol  (VENTOLIN  HFA) 108 (90 Base) MCG/ACT inhaler Inhale 2 puffs into the lungs every 6 (six) hours as needed for wheezing or shortness of breath. 03/18/24   Karamalegos, Marsa PARAS, DO  ASHWAGANDHA GUMMIES PO Take by mouth.    [provider]  cetirizine  (ZYRTEC ) 10 MG tablet Take 10 mg by mouth daily as needed for allergies.    [provider]  cholecalciferol (VITAMIN D3) 25 MCG (1000 UNIT) tablet Take 1,000 Units by mouth daily.    [provider]  fluconazole  (DIFLUCAN ) 150 MG tablet Take 1 tablet (150 mg total) by mouth once for 1 dose. 07/25/24 07/25/24  Copland, Alicia B, PA-C  fluticasone  (FLONASE ) 50  MCG/ACT nasal spray Place 1 spray into both nostrils daily. Use for 4-6 weeks then stop and use seasonally or as needed. 03/18/24   Karamalegos, Marsa PARAS, DO  hydrOXYzine  (ATARAX ) 10 MG tablet Take 1 tablet (10 mg total) by mouth 3 (three) times daily as needed for anxiety. 02/08/22   Karamalegos, Marsa PARAS, DO  montelukast  (SINGULAIR ) 10 MG tablet Take 10 mg by mouth at bedtime as needed.    [provider]  Multiple Vitamins-Minerals (ONE-A-DAY WOMENS PO) Take 1 tablet by mouth daily.    [provider]  Spacer/Aero-Holding Chambers (AEROCHAMBER MV) inhaler Use as instructed 03/14/22   Edman Marsa PARAS, DO  WEGOVY  0.25 MG/0.5ML SOAJ Inject 0.25 mg into the skin once a week. 05/09/24   Karamalegos, Marsa PARAS, DO    Family History Family History  Problem Relation Age of Onset   Alcohol abuse Mother    Drug abuse Mother    Diabetes Mother    Depression Mother    Mental retardation Mother    Bipolar disorder Mother    Heart disease Father    Diabetes Father    Heart failure Sister    Stroke Maternal Grandfather    Diabetes Maternal Grandfather     Social History Social History   Tobacco Use   Smoking status: Never    Passive exposure: Never   Smokeless tobacco: Never  Vaping Use   Vaping status: Never Used  Substance Use Topics   Alcohol use: Not Currently   Drug use: No     Allergies   Reglan [metoclopramide] and Contrave  [naltrexone -bupropion  hcl er]   Review of Systems Review of Systems  Genitourinary:  Positive for dysuria.  Skin:  Positive for rash.     Physical Exam Triage Vital Signs ED Triage Vitals  Encounter Vitals Group     BP 07/25/24 0947 (!) 137/90     Girls Systolic BP Percentile --      Girls Diastolic BP Percentile --      Boys Systolic BP Percentile --      Boys Diastolic BP Percentile --      Pulse Rate 07/25/24 0947 88     Resp 07/25/24 0947 18     Temp 07/25/24 0947 98.2 F (36.8 C)     Temp Source 07/25/24  0947 Oral     SpO2 07/25/24 0947 100 %     Weight 07/25/24 0946 260 lb (117.9 kg)     Height --      Head Circumference --      Peak Flow --  Pain Score 07/25/24 0945 0     Pain Loc --      Pain Education --      Exclude from Growth Chart --    No data found.  Updated Vital Signs BP (!) 137/90 (BP Location: Left Arm)   Pulse 88   Temp 98.2 F (36.8 C) (Oral)   Resp 18   Wt 260 lb (117.9 kg)   LMP 07/12/2024 (Exact Date)   SpO2 100%   BMI 43.27 kg/m   Visual Acuity Right Eye Distance:   Left Eye Distance:   Bilateral Distance:    Right Eye Near:   Left Eye Near:    Bilateral Near:     Physical Exam Vitals and nursing note reviewed.  Constitutional:      Appearance: Normal appearance.  HENT:     Head: Normocephalic and atraumatic.  Eyes:     Pupils: Pupils are equal, round, and reactive to light.  Cardiovascular:     Rate and Rhythm: Normal rate.  Pulmonary:     Effort: Pulmonary effort is normal.  Abdominal:     Tenderness: There is no right CVA tenderness or left CVA tenderness.  Skin:    General: Skin is warm and dry.     Findings: Rash is not crusting, nodular, purpuric, pustular, scaling, urticarial or vesicular.         Comments: Erythematous hyperpigmented within bilateral groin as well as underneath pannus.  Neurological:     General: No focal deficit present.     Mental Status: She is alert and oriented to person, place, and time.  Psychiatric:        Mood and Affect: Mood normal.        Behavior: Behavior normal.      UC Treatments / Results  Labs (all labs ordered are listed, but only abnormal results are displayed) Labs Reviewed  URINALYSIS, W/ REFLEX TO CULTURE (INFECTION SUSPECTED) - Abnormal; Notable for the following components:      Result Value   Hgb urine dipstick TRACE (*)    Leukocytes,Ua TRACE (*)    Bacteria, UA MANY (*)    All other components within normal limits  URINE CULTURE  PREGNANCY, URINE  RPR  HIV ANTIBODY  (ROUTINE TESTING W REFLEX)  CERVICOVAGINAL ANCILLARY ONLY    EKG   Radiology No results found.  Procedures Procedures (including critical care time)  Medications Ordered in UC Medications - No data to display  Initial Impression / Assessment and Plan / UC Course  I have reviewed the triage vital signs and the nursing notes.  Pertinent labs & imaging results that were available during my care of the patient were reviewed by me and considered in my medical decision making (see chart for details).     Reviewed exam and symptoms with patient.  No red flags.  Discussed intertrigo.  She will pick up the Diflucan  sent by her PCP recently and will start topical ketoconazole .  Advised keep area clean and dry.  STD testing is ordered and will contact for any positive results.  UA shows possible UTI, will culture and start Macrobid  twice daily for 5 days.  Discussed rest fluids and PCP follow-up if symptoms do not improve.  ER precautions reviewed. Final Clinical Impressions(s) / UC Diagnoses   Final diagnoses:  Suprapubic cramping  Screening examination for STD (sexually transmitted disease)  Intertrigo  Acute cystitis with hematuria     Discharge Instructions      The clinic will  contact you with results of the STD testing and urine culture done today if positive.  Start ketoconazole  topical antifungal cream to the affected areas daily.  Also start the fluconazole  that was sent by your PCP.  Start Macrobid  twice daily for 5 days to treat your urinary tract infection.  Lots of rest and fluids.  Please follow-up with your PCP if your symptoms do not improve.  Please go to the ER if you develop any worsening symptoms.  Hope you feel better soon!     ED Prescriptions     Medication Sig Dispense Auth. Provider   ketoconazole  (NIZORAL ) 2 % cream Apply 1 Application topically daily. 30 g Arnav Cregg, Jodi R, NP   nitrofurantoin , macrocrystal-monohydrate, (MACROBID ) 100 MG capsule Take 1  capsule (100 mg total) by mouth 2 (two) times daily. 10 capsule Aislyn Hayse, Jodi R, NP      PDMP not reviewed this encounter.   Loreda Myla SAUNDERS, NP 07/25/24 1040

## 2024-07-25 NOTE — Discharge Instructions (Addendum)
 The clinic will contact you with results of the STD testing and urine culture done today if positive.  Start ketoconazole  topical antifungal cream to the affected areas daily.  Also start the fluconazole  that was sent by your PCP.  Start Macrobid  twice daily for 5 days to treat your urinary tract infection.  Lots of rest and fluids.  Please follow-up with your PCP if your symptoms do not improve.  Please go to the ER if you develop any worsening symptoms.  Hope you feel better soon!

## 2024-07-25 NOTE — ED Triage Notes (Signed)
 Patient states that she had covid 2 weeks ago. Was sweating really bad. Patient now has irritation in her groin area and under her belly. Patient is also having lower abdominal pressure. Patient states that she was really fatigued yesterday

## 2024-07-26 ENCOUNTER — Ambulatory Visit (HOSPITAL_COMMUNITY): Payer: Self-pay

## 2024-07-26 LAB — CERVICOVAGINAL ANCILLARY ONLY
Chlamydia: NEGATIVE
Comment: NEGATIVE
Comment: NEGATIVE
Comment: NORMAL
Neisseria Gonorrhea: NEGATIVE
Trichomonas: NEGATIVE

## 2024-07-26 LAB — URINE CULTURE: Culture: NO GROWTH

## 2024-07-26 LAB — RPR: RPR Ser Ql: NONREACTIVE

## 2024-07-26 MED ORDER — BLOOD PRESSURE CUFF MISC: 0 refills | Status: DC

## 2024-08-09 ENCOUNTER — Other Ambulatory Visit: Payer: Self-pay

## 2024-08-10 NOTE — Patient Outreach (Signed)
 Complex Care Management   Visit Note   Name:  Jasmine Gordon MRN: [id number] DOB: 1986/09/24  Situation: Referral received for Complex Care Management related to Asthma, Hyperlipidemia and Anxiety I obtained verbal consent from Patient.  Visit completed with Jasmine Gordon via telephone.  Background:   Past Medical History:  Diagnosis Date   Allergy    Anemia    Anxiety    Asthma    HPV (human papilloma virus) infection    Migraine    Obese    Panic attack    PTSD (post-traumatic stress disorder) 2016    Assessment: Patient Reported Symptoms:  Cognitive Cognitive Status: Alert and oriented to person, place, and time, Normal speech and language skills Cognitive/Intellectual Conditions Management [RPT]: None reported or documented in medical history or problem list Health Maintenance Behaviors: Annual physical exam, Exercise, Healthy diet, Spiritual practice(s), Stress management Healing Pattern: Average Health Facilitated by: Rest, Stress management, Healthy diet  Neurological Neurological Review of Symptoms: No symptoms reported Neurological Management Strategies: Routine screening Neurological Self-Management Outcome: 4 (good)  HEENT HEENT Symptoms Reported: No symptoms reported HEENT Management Strategies: Routine screening HEENT Self-Management Outcome: 4 (good)  Cardiovascular Cardiovascular Symptoms Reported: No symptoms reported Does patient have uncontrolled Hypertension?: No Cardiovascular Management Strategies: Diet modification, Exercise, Coping strategies, Medication therapy, Routine screening (Pending Blood Pressure cuff)  Respiratory Respiratory Symptoms Reported: No symptoms reported Respiratory Management Strategies: Routine screening Respiratory Self-Management Outcome: 4 (good)  Endocrine Endocrine Symptoms Reported: No symptoms reported Is patient diabetic?: No Endocrine Self-Management Outcome: 4 (good)  Gastrointestinal Gastrointestinal Symptoms  Reported: Other Other Gastrointestinal Symptoms: Obesity. Remains compliant in working toward weight loss goals Gastrointestinal Management Strategies: Activity, Diet modification, Exercise Gastrointestinal Self-Management Outcome: 4 (good) Nutrition Risk Screen (CP):  (Intentional weight loss. Reports experiencing food insecurity in the past year but recently received approval for SNAP benefits. Agreed to keep the care team updated of needs)  Genitourinary Genitourinary Symptoms Reported: No symptoms reported  Integumentary Integumentary Symptoms Reported: No symptoms reported Skin Self-Management Outcome: 4 (good)  Musculoskeletal Musculoskelatal Symptoms Reviewed: No symptoms reported Musculoskeletal Self-Management Outcome: 4 (good)  Psychosocial Psychosocial Symptoms Reported: Anxiety - if selected complete GAD Behavioral Management Strategies: Coping strategies, Medication therapy, Exercise Behavioral Health Self-Management Outcome: 4 (good) Behavioral Health Comment: Reports attempting to manage symptoms with therapy, medications and exercise Major Change/Loss/Stressor/Fears (CP): Separation or divorce Behaviors When Feeling Stressed/Fearful: Rest, Exercising and reflecting. Reports also having a local church group Techniques to [employer] with Loss/Stress/Change: Counseling, Diversional activities, Exercise, Medication, Spiritual practice(s) Quality of Family Relationships: non-existent Do you feel physically threatened by others?: No     There were no vitals filed for this visit.  Medications Reviewed Today     Reviewed by Karoline Lima, RN (Registered Nurse) on 08/10/24 at 1133  Med List Status: <None>   Medication Order Taking? Sig Documenting Provider Last Dose Status Informant  albuterol  (PROVENTIL ) (2.5 MG/3ML) 0.083% nebulizer solution [id number]  Take 3 mLs (2.5 mg total) by nebulization every 4 (four) hours as needed for wheezing or shortness of breath. Edman Marsa PARAS, DO  Active   albuterol  (VENTOLIN  HFA) 108 [phone] Base) MCG/ACT inhaler [id number]  Inhale 2 puffs into the lungs every 6 (six) hours as needed for wheezing or shortness of breath. Edman Marsa PARAS, DO  Active   ASHWAGANDHA GUMMIES PO [id number]  Take by mouth. [provider]  Active   Blood Pressure Monitoring (BLOOD PRESSURE CUFF) MISC [id number]  Use to monitor home blood pressure Edman, Marsa  J, DO  Active   cetirizine  (ZYRTEC ) 10 MG tablet [id number]  Take 10 mg by mouth daily as needed for allergies. [provider]  Active   cholecalciferol (VITAMIN D3) 25 MCG (1000 UNIT) tablet [id number]  Take 1,000 Units by mouth daily. [provider]  Active   fluticasone  (FLONASE ) 50 MCG/ACT nasal spray [id number]  Place 1 spray into both nostrils daily. Use for 4-6 weeks then stop and use seasonally or as needed. Edman Marsa PARAS, DO  Active   hydrOXYzine  (ATARAX ) 10 MG tablet [id number]  Take 1 tablet (10 mg total) by mouth 3 (three) times daily as needed for anxiety. Edman Marsa PARAS, DO  Active   ketoconazole  (NIZORAL ) 2 % cream [id number]  Apply 1 Application topically daily. Mayer, Jodi R, NP  Active   montelukast  (SINGULAIR ) 10 MG tablet [id number]  Take 10 mg by mouth at bedtime as needed. [provider]  Active   Multiple Vitamins-Minerals (ONE-A-DAY WOMENS PO) [id number]  Take 1 tablet by mouth daily. [provider]  Active   nitrofurantoin , macrocrystal-monohydrate, (MACROBID ) 100 MG capsule [id number]  Take 1 capsule (100 mg total) by mouth 2 (two) times daily. Loreda Myla SAUNDERS, NP  Active   Spacer/Aero-Holding Chambers (AEROCHAMBER MV) inhaler [id number]  Use as instructed Edman Marsa PARAS, DO  Active   WEGOVY  0.25 MG/0.5ML EMMANUEL [id number]  Inject 0.25 mg into the skin once a week. Edman Marsa PARAS, DO  Active             Recommendation:   PCP Follow-up on September 09, 2024 Continue Current Plan of  Care  Follow Up Plan:   Telephone follow up appointment with Nurse Case Manager on September 09, 2024   Jackson Acron [facility] RN Care Manager Direct Dial: [phone]  Fax: [phone] Website: [email or web address]

## 2024-08-10 NOTE — Patient Instructions (Signed)
 Thank you for allowing the Complex Care Management team to participate in your care. It was great speaking with you!  Reminders: -Please attend your appointment with Dr. Edman as scheduled on September 09, 2024  We will follow up on September 09, 2024 at 2:00 pm. Please do not hesitate to contact me if you require assistance prior to our next outreach.  Visit Information  Ms. Jolena Kittle was given information about Medicaid Managed Care team care coordination services as a part of their Healthy [facility] Medicaid benefit. Julieann Arneshia Ade   If you would like to schedule transportation through your Healthy [facility] plan, please call the following number at least 2 days in advance of your appointment: [phone]  For information about your ride after you set it up, call Ride Assist at [phone]. Use this number to activate a Will Call pickup, or if your transportation is late for a scheduled pickup. Use this number, too, if you need to make a change or cancel a previously scheduled reservation.  If you need transportation services right away, call [phone]. The after-hours call center is staffed 24 hours to handle ride assistance and urgent reservation requests (including discharges) 365 days a year. Urgent trips include sick visits, hospital discharge requests and life-sustaining treatment.  Call the [facility] Line at [phone], at any time, 24 hours a day, 7 days a week. If you are in danger or need immediate medical attention call 911.   Goals      VBCI RNCM:  Monitor, Self-Manage And Reduce Symptoms of HLD     Problems:  Chronic Disease Management support and education needs related to Hyperlipidemia  Goal: Over the next 3 months the patient will: Attend all scheduled medical appointments Demonstrate ongoing adherence to prescribed treatment plan for HLD  Interventions:  Hyperlipidemia Interventions:(Status: Goal on track:  Yes.) Long Term Goal   Reviewed established cholesterol goals  Currently  not taking medications and attempting to monitor with exercise and nutritional intake. Attempts to exercise several days a week. Reports tolerating sessions without complications. Reports monitoring nutritional intake as advised. Reports currently doing well, however notes episodes of food insecurity due to inconsistent spousal support. Confirmed receiving SNAP benefits. Will keep the team update if additional nutritional resources are needed. Last lipid panel was drawn in November 2024. Discussed importance of completing updated labs when ordered by PCP. Reviewed signs and symptoms of heart attack, stroke and indications for seeking immediate medical attention.   Lab Results  Component Value Date   CHOL 220 (H) 09/25/2023   HDL 46 (L) 09/25/2023   LDLCALC 155 (H) 09/25/2023   TRIG 87 09/25/2023   CHOLHDL 4.8 09/25/2023       Patient Self-Care Activities:  Complete labs as ordered Attend all scheduled provider appointments Continue consistent exercise Read nutrition labels. Eat whole grains, fruits and vegetables, lean meats and healthy fats Limit intake of sodium and cholesterol Call provider office for new concerns or questions   Plan:  The patient has been provided with contact information for the care management team and has been advised to call with any health related questions or concerns.           Please see education materials related to Hyperlipidemia and Influenza provided by MyChart link.  Patient verbalizes understanding of instructions and care plan provided today and agrees to view in MyChart. Active MyChart status and patient understanding of how to access instructions and care plan via MyChart confirmed with patient.  RN Care Manager will follow up on September 09, 2024 at 2 pm.   Jackson Acron [facility] RN Care Manager Direct Dial: [phone]  Fax: [phone] Website:  [email or web address]

## 2024-08-12 ENCOUNTER — Other Ambulatory Visit: Payer: Self-pay | Admitting: Licensed Clinical Social Worker

## 2024-08-12 NOTE — Patient Instructions (Signed)
 Visit Information  Thank you for taking time to visit with me today. Please don't hesitate to contact me if I can be of assistance to you in the future! You are doing a great job at putting yourself and your physical/behavioral health a priority.   Following is a copy of your care plan:   Goals Addressed             This Visit's Progress    COMPLETED: LCSW VBCI Social Work Care Plan       Problems:   Family and relationship dysfunction, Lacks knowledge of how to connect , and Depression  , Financial Strain , and Stress   Current barriers:   Chronic Mental Health needs related to anxiety, panic and stress management  Mental Health Concerns  and Social Isolation Ongoing relationship stress with spouse. Needs Support, Education, and Care Coordination in order to meet unmet mental health needs.   CSW Clinical Goal(s): verbalize understanding of plan for management of Anxiety, Panic and Stress     Clinical Interventions:  Assessed patient's previous and current treatment, coping skills, support system and barriers to care. Patient provided hx  Verbalization of feelings encouraged, motivational interviewing employed Emotional support provided, positive coping strategies explored Self care/establishing healthy boundaries emphasized Patient reports that her anxiety has increased which has affected her ability to carry out work and function daily.  Patient reports having a limited support network. Patient reports that she experiences both anxiety and panic attacks. She shares that she felt alone most days. She reports talking about her feelings with [employer]. 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 She was receptive to anxiety and depression management coping skill education. LCSW provided education on relaxation techniques such as meditation, deep breathing, massage, grounding exercsies or yoga that can activate the body's relaxation  response and ease symptoms of stress and anxiety. LCSW ask that when pt is struggling with difficult emotions and racing thoughts that they start this relaxation response process. LCSW provided extensive education on healthy coping skills for anxiety. SW used active and reflective listening, validated patient's feelings/concerns, and provided emotional support. Motivational Interviewing employed Depression screen reviewed  PHQ2/ PHQ9 and GAD completed Mindfulness or Relaxation training provided Active listening / Reflection utilized  Emotional Support Provided Problem Solving /Task Center strategies reviewed Provided psychoeducation for mental health needs  Provided brief CBT  Reviewed mental health medications and discussed importance of compliance:  Quality of sleep assessed & Sleep Hygiene techniques promoted  Participation in counseling encouraged  Verbalization of feelings encouraged  Suicidal Ideation/Homicidal Ideation assessed: Patient denies SI/HI  Pt reports that she started weekly therapy at the [facility] but that her therapist there has already left for a new job and she is waiting for a new therapist. Agency was able to help her with rent for July since she relocated out of her residence and ended relationship with spouse. Patient reports that her mood management has improved somewhat. She denies any current crises and is aware of where to go and who to call if one were to occur. Emotional support provided. [facility], [employer] and [employer] resources were provided to patient. Email was sent to her as well with a complete list of mental health resources in her area that provide the services she is interested in. Patient reports that she is still waiting to get updated on when her new set counselor at [facility] will start. Patient was encouraged  to contact agency to follow up or to consider going to the walk in clinic at [facility]. On 07/09/24, patient remembered that she had a  past negative experience at [facility] and prefers a referral elsewhere. Referral placed on 07/10/24 for [facility]. 08/12/24 update- Patient agreeable to social work case closure at this time. Patient has email to revert back to if needed. BH resources have been successfully provided. PCP and VBCI RNCM updated.          Please call the Suicide and Crisis Lifeline: 988 call the [place]  National Suicide Prevention Lifeline: 415-060-0812 or TTY: (847)835-8261 TTY 417 585 1800) to talk to a trained counselor call 1-800-273-TALK (toll free, 24 hour hotline) go to [facility] [address] [place] [phone]) call 911 if you are experiencing a Mental Health or Behavioral Health Crisis or need someone to talk to.  Patient verbalizes understanding of instructions and care plan provided today and agrees to view in MyChart. Active MyChart status and patient understanding of how to access instructions and care plan via MyChart confirmed with patient.     Lyle Rung, BSW, MSW, LCSW Licensed Clinical Social Worker [employer]   [email or web address] Direct Dial: [phone]

## 2024-08-12 NOTE — Patient Outreach (Signed)
 Complex Care Management   Visit Note  08/12/2024  Name:  Jasmine Gordon MRN: [id number] DOB: 25-Dec-1985  Situation: Referral received for Complex Care Management related to Mental/Behavioral Health diagnosis anxiety/depression. I obtained verbal consent from Patient.  Visit completed with Patient  on the phone  Background:   Past Medical History:  Diagnosis Date   Allergy    Anemia    Anxiety    Asthma    HPV (human papilloma virus) infection    Migraine    Obese    Panic attack    PTSD (post-traumatic stress disorder) 2016    Assessment: Patient Reported Symptoms:  Cognitive Cognitive Status: Able to follow simple commands, Alert and oriented to person, place, and time, Normal speech and language skills Cognitive/Intellectual Conditions Management [RPT]: None reported or documented in medical history or problem list   Health Maintenance Behaviors: Spiritual practice(s), Stress management, Healthy diet, Annual physical exam, Exercise Healing Pattern: Average Health Facilitated by: Rest, Stress management, Healthy diet  Neurological Neurological Review of Symptoms: No symptoms reported Neurological Management Strategies: Routine screening Neurological Self-Management Outcome: 4 (good)  Psychosocial Psychosocial Symptoms Reported: No symptoms reported Behavioral Management Strategies: Coping strategies, Adequate rest, Exercise, Medication therapy Behavioral Health Self-Management Outcome: 4 (good) Major Change/Loss/Stressor/Fears (CP): Separation or divorce Techniques to [employer] with Loss/Stress/Change: Counseling, Diversional activities, Exercise, Medication, Spiritual practice(s) Quality of Family Relationships: non-existent Do you feel physically threatened by others?: No    08/12/2024    PHQ2-9 Depression Screening   Little interest or pleasure in doing things Not at all  Feeling down, depressed, or hopeless Several days  PHQ-2 - Total Score 1  Trouble falling or  staying asleep, or sleeping too much Not at all  Feeling tired or having little energy Several days  Poor appetite or overeating  Not at all  Feeling bad about yourself - or that you are a failure or have let yourself or your family down Not at all  Trouble concentrating on things, such as reading the newspaper or watching television Several days  Moving or speaking so slowly that other people could have noticed.  Or the opposite - being so fidgety or restless that you have been moving around a lot more than usual Not at all  Thoughts that you would be better off dead, or hurting yourself in some way Not at all  PHQ2-9 Total Score 3  If you checked off any problems, how difficult have these problems made it for you to do your work, take care of things at home, or get along with other people Somewhat difficult  Depression Interventions/Treatment PHQ2-9 Score <4 Follow-up Not Indicated (Pt denied BH referral at this time but is aware of her [facility] resources in the area for talk therapy and psychiatry)    There were no vitals filed for this visit.  Medications Reviewed Today     Reviewed by Merlynn Lyle CROME, LCSW (Social Worker) on 08/12/24 at 1501  Med List Status: <None>   Medication Order Taking? Sig Documenting Provider Last Dose Status Informant  albuterol  (PROVENTIL ) (2.5 MG/3ML) 0.083% nebulizer solution [id number]  Take 3 mLs (2.5 mg total) by nebulization every 4 (four) hours as needed for wheezing or shortness of breath. Edman Marsa PARAS, DO  Active   albuterol  (VENTOLIN  HFA) 108 [phone] Base) MCG/ACT inhaler [id number]  Inhale 2 puffs into the lungs every 6 (six) hours as needed for wheezing or shortness of breath. Karamalegos, Marsa PARAS, DO  Active   ASHWAGANDHA GUMMIES PO  [id number]  Take by mouth. [provider]  Active   Blood Pressure Monitoring (BLOOD PRESSURE CUFF) MISC [id number]  Use to monitor home blood pressure Karamalegos, Marsa PARAS, DO  Active   cetirizine   (ZYRTEC ) 10 MG tablet [id number]  Take 10 mg by mouth daily as needed for allergies. [provider]  Active   cholecalciferol (VITAMIN D3) 25 MCG (1000 UNIT) tablet [id number]  Take 1,000 Units by mouth daily. [provider]  Active   fluticasone  (FLONASE ) 50 MCG/ACT nasal spray [id number]  Place 1 spray into both nostrils daily. Use for 4-6 weeks then stop and use seasonally or as needed. Edman Marsa PARAS, DO  Active   hydrOXYzine  (ATARAX ) 10 MG tablet [id number]  Take 1 tablet (10 mg total) by mouth 3 (three) times daily as needed for anxiety. Edman Marsa PARAS, DO  Active   ketoconazole  (NIZORAL ) 2 % cream [id number]  Apply 1 Application topically daily. Mayer, Jodi R, NP  Active   montelukast  (SINGULAIR ) 10 MG tablet [id number]  Take 10 mg by mouth at bedtime as needed. [provider]  Active   Multiple Vitamins-Minerals (ONE-A-DAY WOMENS PO) [id number]  Take 1 tablet by mouth daily. [provider]  Active   nitrofurantoin , macrocrystal-monohydrate, (MACROBID ) 100 MG capsule [id number]  Take 1 capsule (100 mg total) by mouth 2 (two) times daily. Loreda Myla SAUNDERS, NP  Active   Spacer/Aero-Holding Chambers (AEROCHAMBER MV) inhaler [id number]  Use as instructed Edman Marsa PARAS, DO  Active   WEGOVY  0.25 MG/0.5ML EMMANUEL [id number]  Inject 0.25 mg into the skin once a week. Edman Marsa PARAS, DO  Active             Recommendation:   PCP Follow-up Specialty provider follow-up Consider BH resources provided Continue Current Plan of Care  Follow Up Plan:   Closing From:  VBCI LCSW CCM  Lyle Rung, BSW, MSW, LCSW Licensed Clinical Social Worker [employer]   [facility] [email or web address] Direct Dial: [phone]

## 2024-08-15 ENCOUNTER — Ambulatory Visit: Admitting: Internal Medicine

## 2024-08-15 ENCOUNTER — Encounter: Payer: Self-pay | Admitting: Internal Medicine

## 2024-08-15 VITALS — BP 120/80 | HR 99 | Temp 99.1°F | Ht 65.0 in | Wt 263.0 lb

## 2024-08-15 DIAGNOSIS — G4719 Other hypersomnia: Secondary | ICD-10-CM

## 2024-08-15 DIAGNOSIS — G4733 Obstructive sleep apnea (adult) (pediatric): Secondary | ICD-10-CM | POA: Diagnosis not present

## 2024-08-15 DIAGNOSIS — Z6841 Body Mass Index (BMI) 40.0 and over, adult: Secondary | ICD-10-CM | POA: Diagnosis not present

## 2024-08-15 DIAGNOSIS — E669 Obesity, unspecified: Secondary | ICD-10-CM | POA: Diagnosis not present

## 2024-08-15 NOTE — Progress Notes (Signed)
 Name: Jasmine Gordon MRN: [id number] DOB: [birth year]     CHIEF COMPLAINT:  Follow-up assessment for OSA  HISTORY OF PRESENT ILLNESS: Home sleep test March 2024 shows AHI of 12  Approximate weight 264 pounds Patient does not have any significant issues with fatigue or excessive daytime sleepiness at this time I have reviewed her sleep study results with her in detail again she has a mild form of sleep apnea  I strongly encourage weight loss Patient does have some intermittent reactive airways disease very mild in nature  No exacerbation at this time No evidence of heart failure at this time No evidence or signs of infection at this time No respiratory distress No fevers, chills, nausea, vomiting, diarrhea No evidence of lower extremity edema No evidence hemoptysis   PAST MEDICAL HISTORY :   has a past medical history of Allergy, Anemia, Anxiety, Asthma, HPV (human papilloma virus) infection, Migraine, Obese, Panic attack, and PTSD (post-traumatic stress disorder) (2016).  has a past surgical history that includes Appendectomy; Plantar warts; LEEP (12/2020); and Wisdom tooth extraction. Prior to Admission medications   Medication Sig Start Date End Date Taking? Authorizing Provider  albuterol  (VENTOLIN  HFA) 108 (90 Base) MCG/ACT inhaler Inhale 2 puffs into the lungs every 6 (six) hours as needed for wheezing or shortness of breath. 09/07/22  Yes Karamalegos, Marsa PARAS, DO  cetirizine  (ZYRTEC ) 10 MG tablet Take 10 mg by mouth daily as needed for allergies.   Yes [provider]  chlorhexidine (PERIDEX) 0.12 % solution Use as directed 10 mLs in the mouth or throat. 01/20/22  Yes [provider]  dicyclomine  (BENTYL ) 10 MG capsule Take 1 capsule (10 mg total) by mouth 4 (four) times daily -  before meals and at bedtime. 09/09/22  Yes Claudene Rover, MD  escitalopram  (LEXAPRO ) 10 MG tablet 5 mg daily for one week, then 10 mg daily 09/07/22  Yes Hisada, Katheren, MD   fluticasone  (FLONASE ) 50 MCG/ACT nasal spray Place 1 spray into both nostrils daily. Use for 4-6 weeks then stop and use seasonally or as needed. 03/27/21  Yes Antonette Angeline ORN, NP  hydrOXYzine  (ATARAX ) 10 MG tablet Take 1 tablet (10 mg total) by mouth 3 (three) times daily as needed for anxiety. 02/08/22  Yes Karamalegos, Marsa PARAS, DO  ibuprofen  (ADVIL ) 800 MG tablet Take 1 tablet (800 mg total) by mouth every 8 (eight) hours as needed. 10/07/22  Yes Saunders Shona CROME, PA-C  methocarbamol  (ROBAXIN ) 500 MG tablet Take 1-2 tablets every 6 hours prn muscle spasms 10/07/22  Yes Saunders Shona L, PA-C  montelukast  (SINGULAIR ) 10 MG tablet Take 10 mg by mouth at bedtime as needed.   Yes [provider]  Multiple Vitamins-Minerals (ONE-A-DAY WOMENS PO) Take 1 tablet by mouth daily.   Yes [provider]  OVER THE COUNTER MEDICATION Goli ashwaganda gummy 1/2 daily   Yes [provider]  pantoprazole  (PROTONIX ) 40 MG tablet Take 1 tablet (40 mg total) by mouth daily. 05/18/22  Yes Furth, Cadence H, PA-C  Spacer/Aero-Holding Chambers (AEROCHAMBER MV) inhaler Use as instructed 03/14/22  Yes Karamalegos, Marsa PARAS, DO  Vitamin D , Ergocalciferol , (DRISDOL ) 1.25 MG (50000 UNIT) CAPS capsule Take 1 capsule (50,000 Units total) by mouth every 7 (seven) days. 07/27/21  Yes Karamalegos, Marsa PARAS, DO  metoprolol  tartrate (LOPRESSOR ) 25 MG tablet Take 0.5 tablets (12.5 mg total) by mouth 2 (two) times daily. 05/18/22 08/16/22  Furth, Cadence H, PA-C   Allergies  Allergen Reactions   Reglan [  Metoclopramide] Hives and Shortness Of Breath   Contrave  [Naltrexone -Bupropion  Hcl Er] Other (See Comments)    Had some reaction to the medication    FAMILY HISTORY:  family history includes Alcohol abuse in her mother; Bipolar disorder in her mother; Depression in her mother; Diabetes in her father, maternal grandfather, and mother; Drug abuse in her mother; Heart disease in her father; Heart failure  in her sister; Mental retardation in her mother; Stroke in her maternal grandfather. SOCIAL HISTORY:  reports that she has never smoked. She has never been exposed to tobacco smoke. She has never used smokeless tobacco. She reports that she does not currently use alcohol. She reports that she does not use drugs.  LMP 07/12/2024 (Exact Date)   BP 120/80   Pulse 99   Temp 99.1 F (37.3 C)   Ht 5' 5 (1.651 m)   Wt 263 lb (119.3 kg)   LMP 07/12/2024 (Exact Date)   SpO2 99%   BMI 43.77 kg/m     Review of Systems: Gen:  Denies  fever, sweats, chills weight loss  HEENT: Denies blurred vision, double vision, ear pain, eye pain, hearing loss, nose bleeds, sore throat Cardiac:  No dizziness, chest pain or heaviness, chest tightness,edema, No JVD Resp:   No cough, -sputum production, -shortness of breath,-wheezing, -hemoptysis,  Other:  All other systems negative   Physical Examination:   General Appearance: No distress  EYES PERRLA, EOM intact.   NECK Supple, No JVD Pulmonary: normal breath sounds, No wheezing.  CardiovascularNormal S1,S2.  No m/r/g.   Abdomen: Benign, Soft, non-tender. Neurology UE/LE 5/5 strength, no focal deficits Ext pulses intact, cap refill intact ALL OTHER ROS ARE NEGATIVE     ASSESSMENT AND PLAN SYNOPSIS  38 year old pleasant African-American female seen today follow-up for mild OSA with AHI of 12 with nocturnal hypoxia  Patient does not have any significant excessive daytime sleepiness or fatigue at this time Patient is working on weight loss At this time patient does not tolerate CPAP machine Patient declined inspire device referral at this time   Obesity -recommend significant weight loss -recommend changing diet  Deconditioned state -Recommend increased daily activity and exercise     Follow up 1 year   I spent a total of 25 minutes dedicated to the care of this patient on the date of this encounter to include pre-visit review of  records, face-to-face time with the patient discussing conditions above, post visit ordering of testing, clinical documentation with the electronic health record, making appropriate referrals as documented, and communicating necessary information to the patient's healthcare team.    The Patient requires high complexity decision making for assessment and support, frequent evaluation and titration of therapies, application of advanced monitoring technologies and extensive interpretation of multiple databases.  Patient satisfied with Plan of action and management. All questions answered    Nickolas Alm Cellar, M.D.  [facility]  Medical Director [facility] Medical Director [facility] Cardio-Pulmonary Department

## 2024-08-15 NOTE — Patient Instructions (Signed)
 Recommend weight loss Monitor sleep habits Monitor your breathing Avoid Allergens and Irritants Avoid secondhand smoke Avoid SICK contacts Recommend  Masking  when appropriate Recommend Keep up-to-date with vaccinations

## 2024-08-22 ENCOUNTER — Encounter: Payer: Self-pay | Admitting: Obstetrics and Gynecology

## 2024-08-24 ENCOUNTER — Ambulatory Visit
Admission: RE | Admit: 2024-08-24 | Discharge: 2024-08-24 | Disposition: A | Discharge status: Home / Self Care | Attending: Emergency Medicine

## 2024-08-24 VITALS — BP 122/84 | HR 100 | Temp 97.5°F | Resp 19

## 2024-08-24 DIAGNOSIS — N898 Other specified noninflammatory disorders of vagina: Secondary | ICD-10-CM | POA: Diagnosis present

## 2024-08-24 DIAGNOSIS — Z113 Encounter for screening for infections with a predominantly sexual mode of transmission: Secondary | ICD-10-CM | POA: Insufficient documentation

## 2024-08-24 DIAGNOSIS — Z202 Contact with and (suspected) exposure to infections with a predominantly sexual mode of transmission: Secondary | ICD-10-CM | POA: Diagnosis present

## 2024-08-24 LAB — POCT URINE PREGNANCY: Preg Test, Ur: NEGATIVE

## 2024-08-24 NOTE — ED Triage Notes (Signed)
 Patient to Urgent Care for STD testing/ urine preg test.   Reports her sexual partner was tested w/ an abnormal RPR result (Wednesday). Denies any known symptoms.

## 2024-08-24 NOTE — Discharge Instructions (Addendum)
 Your STD tests are pending.  Do not have any sexual activity until all test results are back and treatment is completed if needed.    Follow-up with your primary care provider on Monday.

## 2024-08-24 NOTE — ED Provider Notes (Signed)
 [facility]    CSN: [id number] Arrival date & time: 08/24/24  0827      History   Chief Complaint Chief Complaint  Patient presents with   Vaginal Discharge    Concerned about possible Sti and pregnancy - Entered by patient    HPI Jasmine Gordon is a 38 y.o. female.  Patient presents with vaginal discharge and request for STD testing.  She states her current sexual partner had a positive initial RPR this week.  Patient request blood work for syphilis, as well as HIV.  She denies rash, lesions, fever, chills, headache, malaise, weight loss, abdominal pain, dysuria, hematuria, pelvic pain.  Patient was seen at [facility] urgent care on 07/25/2024 for acute cystitis with hematuria, suprapubic cramping, STD screening, intertrigo; treated with Macrobid  and ketoconazole  topical; urine culture negative, pregnancy negative, vaginal cytology negative, RPR and HIV negative.  The history is provided by the patient and medical records.    Past Medical History:  Diagnosis Date   Allergy    Anemia    Anxiety    Asthma    Blood transfusion without reported diagnosis 07/11/2008   Loss blood during delivery   Depression    GERD (gastroesophageal reflux disease)    HPV (human papilloma virus) infection    Hyperlipidemia    Migraine    Obese    OSA (obstructive sleep apnea)    Panic attack    PTSD (post-traumatic stress disorder) 2016   Sleep apnea 05/2023    Patient Active Problem List   Diagnosis Date Noted   Latent tuberculosis 06/14/2023   Dysplasia of cervix, high grade CIN 2 02/23/2023   High grade squamous intraepithelial lesion (HGSIL), grade 3 CIN, on biopsy of cervix 02/23/2023   Vitamin B12 deficiency 06/09/2022   Palpitations 01/21/2022   Shortness of breath 01/21/2022   Chest pain 01/21/2022   Generalized anxiety disorder with panic attacks 09/27/2021   Environmental and seasonal allergies 11/04/2020   Genital warts 09/11/2018   Positive TB test 09/25/2017    Allergic contact dermatitis due to metals 05/17/2017   Pre-diabetes 03/06/2017   Hyperlipidemia 03/06/2017   Allergic reaction 02/08/2017   Urticaria due to food allergy 02/08/2017   Vitamin D  deficiency 11/08/2016   Iron  deficiency anemia due to chronic blood loss 10/03/2016   Menorrhagia with regular cycle 06/27/2016   Mild persistent asthma without complication 06/17/2016   Anxiety and depression 06/17/2016   Morbid obesity with BMI of 40.0-44.9, adult (HCC) 06/17/2016   PTSD (post-traumatic stress disorder) 2016    Past Surgical History:  Procedure Laterality Date   APPENDECTOMY  09/2007   LEEP  12/2020   CIN II, negative margins   Plantar warts     WISDOM TOOTH EXTRACTION      OB History     Gravida  3   Para  3   Term  3   Preterm      AB      Living  3      SAB      IAB      Ectopic      Multiple      Live Births  3            Home Medications    Prior to Admission medications   Medication Sig Start Date End Date Taking? Authorizing Provider  albuterol  (PROVENTIL ) (2.5 MG/3ML) 0.083% nebulizer solution Take 3 mLs (2.5 mg total) by nebulization every 4 (four) hours as needed for wheezing  or shortness of breath. 03/18/24 03/18/25  Karamalegos, Marsa PARAS, DO  albuterol  (VENTOLIN  HFA) 108 (90 Base) MCG/ACT inhaler Inhale 2 puffs into the lungs every 6 (six) hours as needed for wheezing or shortness of breath. 03/18/24   Karamalegos, Marsa PARAS, DO  ASHWAGANDHA GUMMIES PO Take by mouth.    [provider]  Blood Pressure Monitoring (BLOOD PRESSURE CUFF) MISC Use to monitor home blood pressure 07/26/24   Karamalegos, Alexander J, DO  cetirizine  (ZYRTEC ) 10 MG tablet Take 10 mg by mouth daily as needed for allergies.    [provider]  cholecalciferol (VITAMIN D3) 25 MCG (1000 UNIT) tablet Take 1,000 Units by mouth daily.    [provider]  fluconazole  (DIFLUCAN ) 150 MG tablet Take 150 mg by mouth once. 07/25/24   [provider]  fluticasone  (FLONASE ) 50 MCG/ACT nasal spray Place 1 spray into both nostrils daily. Use for 4-6 weeks then stop and use seasonally or as needed. 03/18/24   Karamalegos, Marsa PARAS, DO  hydrOXYzine  (ATARAX ) 10 MG tablet Take 1 tablet (10 mg total) by mouth 3 (three) times daily as needed for anxiety. 02/08/22   Karamalegos, Marsa PARAS, DO  ketoconazole  (NIZORAL ) 2 % cream Apply 1 Application topically daily. 07/25/24   Mayer, Jodi R, NP  montelukast  (SINGULAIR ) 10 MG tablet Take 10 mg by mouth at bedtime as needed.    [provider]  Multiple Vitamins-Minerals (ONE-A-DAY WOMENS PO) Take 1 tablet by mouth daily.    [provider]  nitrofurantoin , macrocrystal-monohydrate, (MACROBID ) 100 MG capsule Take 1 capsule (100 mg total) by mouth 2 (two) times daily. Patient not taking: Reported on 08/24/2024 07/25/24   Loreda Myla SAUNDERS, NP  Spacer/Aero-Holding Chambers (AEROCHAMBER MV) inhaler Use as instructed 03/14/22   Edman Marsa PARAS, DO  WEGOVY  0.25 MG/0.5ML SOAJ Inject 0.25 mg into the skin once a week. Patient not taking: Reported on 08/24/2024 05/09/24   Edman Marsa PARAS, DO    Family History Family History  Problem Relation Age of Onset   Alcohol abuse Mother    Drug abuse Mother    Diabetes Mother    Depression Mother    Mental retardation Mother    Bipolar disorder Mother    Heart disease Father    Diabetes Father    Heart failure Sister    Stroke Maternal Grandfather    Diabetes Maternal Grandfather     Social History Social History   Tobacco Use   Smoking status: Never    Passive exposure: Never   Smokeless tobacco: Never  Vaping Use   Vaping status: Never Used  Substance Use Topics   Alcohol use: Not Currently   Drug use: No     Allergies   Reglan [metoclopramide] and Contrave  [naltrexone -bupropion  hcl er]   Review of Systems Review of Systems  Constitutional:  Negative for chills and fever.  Gastrointestinal:  Negative  for abdominal pain.  Genitourinary:  Positive for vaginal discharge. Negative for dysuria, hematuria and pelvic pain.  Skin:  Negative for color change, rash and wound.     Physical Exam Triage Vital Signs ED Triage Vitals  Encounter Vitals Group     BP      Girls Systolic BP Percentile      Girls Diastolic BP Percentile      Boys Systolic BP Percentile      Boys Diastolic BP Percentile      Pulse      Resp      Temp  Temp src      SpO2      Weight      Height      Head Circumference      Peak Flow      Pain Score      Pain Loc      Pain Education      Exclude from Growth Chart    No data found.  Updated Vital Signs BP 122/84   Pulse 100   Temp (!) 97.5 F (36.4 C)   Resp 19   LMP 08/08/2024 (Exact Date)   SpO2 100%   Visual Acuity Right Eye Distance:   Left Eye Distance:   Bilateral Distance:    Right Eye Near:   Left Eye Near:    Bilateral Near:     Physical Exam Constitutional:      General: She is not in acute distress. HENT:     Mouth/Throat:     Mouth: Mucous membranes are moist.  Cardiovascular:     Rate and Rhythm: Normal rate and regular rhythm.  Pulmonary:     Effort: Pulmonary effort is normal. No respiratory distress.  Abdominal:     General: Bowel sounds are normal.     Palpations: Abdomen is soft.     Tenderness: There is no abdominal tenderness. There is no right CVA tenderness, left CVA tenderness, guarding or rebound.  Genitourinary:    Comments: Patient declines GU exam. Neurological:     Mental Status: She is alert.      UC Treatments / Results  Labs (all labs ordered are listed, but only abnormal results are displayed) Labs Reviewed  POCT URINE PREGNANCY - Normal  RPR  HIV ANTIBODY (ROUTINE TESTING W REFLEX)  CERVICOVAGINAL ANCILLARY ONLY    EKG   Radiology No results found.  Procedures Procedures (including critical care time)  Medications Ordered in UC Medications - No data to display  Initial  Impression / Assessment and Plan / UC Course  I have reviewed the triage vital signs and the nursing notes.  Pertinent labs & imaging results that were available during my care of the patient were reviewed by me and considered in my medical decision making (see chart for details).    Vaginal discharge, STD screening, possible exposure to STD.  Patient is concerned for possible exposure to syphilis.  Her new partner had a positive RPR during routine screening this week.  They have been sexually active for the last month or so.  Patient has vaginal discharge.  No lesions.  She obtained a vaginal self swab for STD testing as well as BV and yeast.  Blood drawn today for RPR and HIV.  Instructed her to abstain from all sexual activity until all test results are back and treatment is completed if needed.  Patient reports that her partner's secondary syphilis testing is not back yet.  Instructed her to follow-up with her PCP on Monday.  Education provided today on syphilis.  She agrees to plan of care.  Final Clinical Impressions(s) / UC Diagnoses   Final diagnoses:  Screening for STD (sexually transmitted disease)  Vaginal discharge  Possible exposure to STD     Discharge Instructions      Your STD tests are pending.  Do not have any sexual activity until all test results are back and treatment is completed if needed.    Follow-up with your primary care provider on Monday.     ED Prescriptions   None    PDMP  not reviewed this encounter.   Corlis Burnard DEL, NP 08/24/24 [phone]

## 2024-08-26 ENCOUNTER — Ambulatory Visit (HOSPITAL_COMMUNITY): Payer: Self-pay

## 2024-08-26 LAB — CERVICOVAGINAL ANCILLARY ONLY
Bacterial Vaginitis (gardnerella): NEGATIVE
Candida Glabrata: NEGATIVE
Candida Vaginitis: POSITIVE — AB
Chlamydia: NEGATIVE
Comment: NEGATIVE
Comment: NEGATIVE
Comment: NEGATIVE
Comment: NEGATIVE
Comment: NEGATIVE
Comment: NORMAL
Neisseria Gonorrhea: NEGATIVE
Trichomonas: NEGATIVE

## 2024-08-26 MED ORDER — FLUCONAZOLE 150 MG PO TABS: 150.0000 mg | ORAL_TABLET | Freq: Once | ORAL | 0 refills | Status: AC

## 2024-08-27 LAB — RPR: RPR Ser Ql: NONREACTIVE

## 2024-08-27 LAB — HIV ANTIBODY (ROUTINE TESTING W REFLEX): HIV Screen 4th Generation wRfx: NONREACTIVE

## 2024-09-03 ENCOUNTER — Encounter: Payer: Self-pay | Admitting: Obstetrics and Gynecology

## 2024-09-05 ENCOUNTER — Encounter: Payer: Self-pay | Admitting: Obstetrics and Gynecology

## 2024-09-09 ENCOUNTER — Ambulatory Visit (INDEPENDENT_AMBULATORY_CARE_PROVIDER_SITE_OTHER): Admitting: Family Medicine

## 2024-09-09 ENCOUNTER — Telehealth: Payer: Self-pay

## 2024-09-09 ENCOUNTER — Encounter: Payer: Self-pay | Admitting: Obstetrics and Gynecology

## 2024-09-09 ENCOUNTER — Encounter: Payer: Self-pay | Admitting: Family Medicine

## 2024-09-09 VITALS — BP 124/72 | HR 111 | Ht 65.0 in | Wt 262.2 lb

## 2024-09-09 DIAGNOSIS — F410 Panic disorder [episodic paroxysmal anxiety] without agoraphobia: Secondary | ICD-10-CM

## 2024-09-09 DIAGNOSIS — F411 Generalized anxiety disorder: Secondary | ICD-10-CM | POA: Diagnosis not present

## 2024-09-09 DIAGNOSIS — E559 Vitamin D deficiency, unspecified: Secondary | ICD-10-CM | POA: Diagnosis not present

## 2024-09-09 DIAGNOSIS — Z3201 Encounter for pregnancy test, result positive: Secondary | ICD-10-CM | POA: Diagnosis not present

## 2024-09-09 DIAGNOSIS — N926 Irregular menstruation, unspecified: Secondary | ICD-10-CM | POA: Diagnosis not present

## 2024-09-09 DIAGNOSIS — R7303 Prediabetes: Secondary | ICD-10-CM

## 2024-09-09 DIAGNOSIS — Z6841 Body Mass Index (BMI) 40.0 and over, adult: Secondary | ICD-10-CM

## 2024-09-09 DIAGNOSIS — D500 Iron deficiency anemia secondary to blood loss (chronic): Secondary | ICD-10-CM

## 2024-09-09 LAB — HEMOGLOBIN A1C
Hgb A1c MFr Bld: 5.8 % — ABNORMAL HIGH (ref <5.7)
Mean Plasma Glucose: 120 mg/dL
eAG (mmol/L): 6.6 mmol/L

## 2024-09-09 LAB — CBC WITH DIFFERENTIAL/PLATELET
Absolute Lymphocytes: 1467 {cells}/uL (ref 850–3900)
Absolute Monocytes: 284 {cells}/uL (ref 200–950)
Basophils Absolute: 32 {cells}/uL (ref 0–200)
Basophils Relative: 0.7 %
Eosinophils Absolute: 32 {cells}/uL (ref 15–500)
Eosinophils Relative: 0.7 %
HCT: 35.9 % (ref 35.0–45.0)
Hemoglobin: 11.4 g/dL — ABNORMAL LOW (ref 11.7–15.5)
MCH: 26.6 pg — ABNORMAL LOW (ref 27.0–33.0)
MCHC: 31.8 g/dL — ABNORMAL LOW (ref 32.0–36.0)
MCV: 83.7 fL (ref 80.0–100.0)
MPV: 11 fL (ref 7.5–12.5)
Monocytes Relative: 6.3 %
Neutro Abs: 2687 {cells}/uL (ref 1500–7800)
Neutrophils Relative %: 59.7 %
Platelets: 250 Thousand/uL (ref 140–400)
RBC: 4.29 Million/uL (ref 3.80–5.10)
RDW: 13.6 % (ref 11.0–15.0)
Total Lymphocyte: 32.6 %
WBC: 4.5 Thousand/uL (ref 3.8–10.8)

## 2024-09-09 LAB — IRON,TIBC AND FERRITIN PANEL
%SAT: 29 % (ref 16–45)
Ferritin: 29 ng/mL (ref 16–154)
Iron: 96 ug/dL (ref 40–190)
TIBC: 327 ug/dL (ref 250–450)

## 2024-09-09 LAB — POCT URINE PREGNANCY: Preg Test, Ur: POSITIVE — AB

## 2024-09-09 LAB — VITAMIN D 25 HYDROXY (VIT D DEFICIENCY, FRACTURES): Vit D, 25-Hydroxy: 17 ng/mL — ABNORMAL LOW (ref 30–100)

## 2024-09-09 NOTE — Telephone Encounter (Signed)
 Pls advise pt since I'm not sure of OB protocols now. Thx.

## 2024-09-09 NOTE — Progress Notes (Signed)
 Subjective:    Patient ID: Jasmine Gordon, female    DOB: 08-22-1986, 38 y.o.   MRN: [id number]  Jasmine Gordon is a 38 y.o. female presenting on 09/09/2024 for Medical Management of Chronic Issues   HPI  Discussed the use of AI scribe software for clinical note transcription with the patient, who gave verbal consent to proceed.  History of Present Illness   Jasmine Gordon is a 38 year old female who presents with a confirmed pregnancy.  Confirmed pregnancy - Positive home pregnancy test last week - Pregnancy was unexpected despite limited sexual activity - Current pregnancy from her boyfriend who is supportive  Recent History Vaginal discharge and yeast infection - Increased vaginal discharge prior to pregnancy confirmation - Yeast infection occurred on October 4th, shortly after recovery from COVID-19 - No prior history of similar yeast infections  Morbid Obesity BMI >43 Weight management Previously tried Contrave , Wegovy  - Current weight 260 pounds, decreased from 265 pounds - Following a diet plan with reduced bread and rice, increased meat and vegetables - Engages in daily physical activity, walking one mile five days a week - Improved sleep with increased physical activity - No use of Wegovy  injections; prefers natural weight management  Iron  Deficiency Vitamin D  Deficiency Nutritional supplementation and iron  deficiency - Currently taking prenatal vitamins without iron  - Received iron  infusion approximately one month ago - Prefers iron  infusions over oral iron  supplementation     Anxiety / Depression Previously w/ ARPA Off SSRI medication   Elevated A1c PreDM A1c improved last check 4 month ago, due for repeat Fam history with parents have Diabetes Concerned about her risk of progression to diabetes Not eating as much starch carb sugar.       08/12/2024    3:01 PM 07/03/2024    4:39 PM 06/18/2024    3:29 PM  Depression screen PHQ 2/9   Decreased Interest 0 1 1  Down, Depressed, Hopeless 1 1 1   PHQ - 2 Score 1 2 2   Altered sleeping 0 1 1  Tired, decreased energy 1 1 1   Change in appetite 0 1 1  Feeling bad or failure about yourself  0 1 1  Trouble concentrating 1 1 2   Moving slowly or fidgety/restless 0 0 0  Suicidal thoughts 0 0 0  PHQ-9 Score 3 7 8   Difficult doing work/chores Somewhat difficult Somewhat difficult Somewhat difficult       08/09/2024    3:53 PM 07/10/2024   10:34 AM 07/03/2024    4:40 PM 05/17/2024    2:27 PM  GAD 7 : Generalized Anxiety Score  Nervous, Anxious, on Edge 1 1 1 2   Control/stop worrying 1 1 1 2   Worry too much - different things 1 1 1 2   Trouble relaxing 1 1 1 3   Restless 1 1 1 1   Easily annoyed or irritable 2 2 2 2   Afraid - awful might happen 1 2 1 2   Total GAD 7 Score 8 9 8 14   Anxiety Difficulty Somewhat difficult Somewhat difficult Somewhat difficult Very difficult    Social History   Tobacco Use   Smoking status: Never    Passive exposure: Never   Smokeless tobacco: Never  Vaping Use   Vaping status: Never Used  Substance Use Topics   Alcohol use: Not Currently   Drug use: No    Review of Systems Per HPI unless specifically indicated above     Objective:    BP 124/72 (  BP Location: Left Arm, Patient Position: Sitting)   Pulse (!) 111   Ht 5' 5 (1.651 m)   Wt 262 lb 4 oz (119 kg)   LMP 08/08/2024 (Exact Date)   SpO2 96%   BMI 43.64 kg/m   Wt Readings from Last 3 Encounters:  09/09/24 262 lb 4 oz (119 kg)  08/15/24 263 lb (119.3 kg)  07/25/24 260 lb (117.9 kg)    Physical Exam Vitals and nursing note reviewed.  Constitutional:      General: She is not in acute distress.    Appearance: Normal appearance. She is well-developed. She is obese. She is not diaphoretic.     Comments: Well-appearing, comfortable, cooperative  HENT:     Head: Normocephalic and atraumatic.  Eyes:     General:        Right eye: No discharge.        Left eye: No  discharge.     Conjunctiva/sclera: Conjunctivae normal.  Neck:     Thyroid : No thyromegaly.  Cardiovascular:     Rate and Rhythm: Normal rate and regular rhythm.     Heart sounds: Normal heart sounds. No murmur heard. Pulmonary:     Effort: Pulmonary effort is normal. No respiratory distress.     Breath sounds: Normal breath sounds. No wheezing or rales.  Musculoskeletal:        General: Normal range of motion.     Cervical back: Normal range of motion and neck supple.  Lymphadenopathy:     Cervical: No cervical adenopathy.  Skin:    General: Skin is warm and dry.     Findings: No erythema or rash.  Neurological:     Mental Status: She is alert and oriented to person, place, and time.  Psychiatric:        Mood and Affect: Mood normal.        Behavior: Behavior normal.        Thought Content: Thought content normal.     Comments: Well groomed, good eye contact, normal speech and thoughts     Results for orders placed or performed in visit on 09/09/24  POCT urine pregnancy   Collection Time: 09/09/24  9:08 AM  Result Value Ref Range   Preg Test, Ur Positive (A) Negative      Assessment & Plan:   Problem List Items Addressed This Visit     Iron  deficiency anemia due to chronic blood loss (Chronic)   Relevant Orders   CBC with Differential/Platelet   Iron , TIBC and Ferritin Panel   Generalized anxiety disorder with panic attacks   Morbid obesity with BMI of 40.0-44.9, adult (HCC) - Primary   Pre-diabetes   Relevant Orders   Hemoglobin A1c   Vitamin D  deficiency   Relevant Orders   VITAMIN D  25 Hydroxy (Vit-D Deficiency, Fractures)   Other Visit Diagnoses       Positive pregnancy test       Relevant Orders   POCT urine pregnancy (Completed)     Missed period            Confirmed pregnancy Confirmed pregnancy with POCT positive urine test in office today. No immediate health concerns. Supportive relationship with boyfriend. Started prenatal vitamins. - She  will Message GYN to inform of positive pregnancy test and schedule follow-up appointments. - Continue prenatal vitamins. - Discussed iron  deficiency, check labs, pending further Hematology work up / need for infusions, or can consider oral iron  if need - Order routine labs: iron   levels, A1c, vitamin D .  Pre-Diabetes Check A1c, last checked 4 month ago in 5 range.  Morbid Obesity BMI >43 Weight decreased from 265 lbs to 260 lbs with diet and exercise. Opted against Wegovy  injections previously, advised contraindicated for pregnancy now - Continue current diet and exercise regimen.  Anxiety Chronic problem, currently stable off of medication   Orders Placed This Encounter  Procedures   Hemoglobin A1c   CBC with Differential/Platelet   Iron , TIBC and Ferritin Panel   VITAMIN D  25 Hydroxy (Vit-D Deficiency, Fractures)   POCT urine pregnancy    No orders of the defined types were placed in this encounter.   Follow up plan: Return if symptoms worsen or fail to improve.  Marsa Officer, DO [facility] 09/09/2024, 9:03 AM

## 2024-09-09 NOTE — Patient Instructions (Addendum)
 Thank you for coming to the office today.  Our Urine Pregnancy test today was positive!  Results for orders placed or performed in visit on 09/09/24 (from the past 24 hours)  POCT urine pregnancy     Status: Abnormal   Collection Time: 09/09/24  9:08 AM  Result Value Ref Range   Preg Test, Ur Positive (A) Negative    Routine Labs today, follow on mychart  Keep on Prenatal  Please message back GYN and let them know positive pregnancy test officially on record from our office.  OB / GYN  [facility] at [facility] [address] [place],  [place]  [address] Main: [phone] Fax: [phone]  Please schedule a Follow-up Appointment to: Return if symptoms worsen or fail to improve.  If you have any other questions or concerns, please feel free to call the office or send a message through MyChart. You may also schedule an earlier appointment if necessary.  Additionally, you may be receiving a survey about your experience at our office within a few days to 1 week by e-mail or mail. We value your feedback.  Marsa Officer, DO [facility], [place]

## 2024-09-10 ENCOUNTER — Other Ambulatory Visit: Payer: Self-pay

## 2024-09-10 NOTE — Telephone Encounter (Signed)
The patient has been contacted and scheduled.

## 2024-09-10 NOTE — Patient Outreach (Unsigned)
 Complex Care Management   Visit Note  09/10/2024  Name:  Jasmine Gordon MRN: [id number] DOB: 11/07/1986  Situation: Referral received for Complex Care Management related to {Criteria:32550} I obtained verbal consent from {CHL AMB Patient/Caregiver:28184}.  Visit completed with {CHL AMB Patient/Caregiver:28184}  {VISIT LOCATION:32553}  Background:   Past Medical History:  Diagnosis Date   Allergy    Anemia    Anxiety    Asthma    Blood transfusion without reported diagnosis 07/11/2008   Loss blood during delivery   Depression    GERD (gastroesophageal reflux disease)    HPV (human papilloma virus) infection    Hyperlipidemia    Migraine    Obese    OSA (obstructive sleep apnea)    Panic attack    PTSD (post-traumatic stress disorder) 2016   Sleep apnea 05/2023    Assessment: Patient Reported Symptoms:  Cognitive        Neurological      HEENT        Cardiovascular      Respiratory      Endocrine      Gastrointestinal        Genitourinary      Integumentary      Musculoskeletal          Psychosocial       Do you feel physically threatened by others?: No    09/10/2024    PHQ2-9 Depression Screening   Little interest or pleasure in doing things    Feeling down, depressed, or hopeless    PHQ-2 - Total Score    Trouble falling or staying asleep, or sleeping too much    Feeling tired or having little energy    Poor appetite or overeating     Feeling bad about yourself - or that you are a failure or have let yourself or your family down    Trouble concentrating on things, such as reading the newspaper or watching television    Moving or speaking so slowly that other people could have noticed.  Or the opposite - being so fidgety or restless that you have been moving around a lot more than usual    Thoughts that you would be better off dead, or hurting yourself in some way    PHQ2-9 Total Score    If you checked off any problems, how difficult  have these problems made it for you to do your work, take care of things at home, or get along with other people    Depression Interventions/Treatment      There were no vitals filed for this visit.  Medications Reviewed Today     Reviewed by Karoline Lima, RN (Registered Nurse) on 09/10/24 at 1508  Med List Status: <None>   Medication Order Taking? Sig Documenting Provider Last Dose Status Informant  albuterol  (PROVENTIL ) (2.5 MG/3ML) 0.083% nebulizer solution [id number]  Take 3 mLs (2.5 mg total) by nebulization every 4 (four) hours as needed for wheezing or shortness of breath. Edman Marsa PARAS, DO  Active   albuterol  (VENTOLIN  HFA) 108 (90 Base) MCG/ACT inhaler [id number]  Inhale 2 puffs into the lungs every 6 (six) hours as needed for wheezing or shortness of breath. Edman Marsa PARAS, DO  Active   ASHWAGANDHA GUMMIES PO [id number]  Take by mouth. [provider]  Active   Blood Pressure Monitoring (BLOOD PRESSURE CUFF) MISC [id number]  Use to monitor home blood pressure Karamalegos, Marsa PARAS, DO  Active   cetirizine  (ZYRTEC ) 10 MG tablet  [id number]  Take 10 mg by mouth daily as needed for allergies. [provider]  Active   cholecalciferol (VITAMIN D3) 25 MCG (1000 UNIT) tablet [id number]  Take 1,000 Units by mouth daily. [provider]  Active   fluticasone  (FLONASE ) 50 MCG/ACT nasal spray [id number]  Place 1 spray into both nostrils daily. Use for 4-6 weeks then stop and use seasonally or as needed. Edman Marsa PARAS, DO  Active   hydrOXYzine  (ATARAX ) 10 MG tablet [id number]  Take 1 tablet (10 mg total) by mouth 3 (three) times daily as needed for anxiety. Edman Marsa PARAS, DO  Active   ketoconazole  (NIZORAL ) 2 % cream [id number]  Apply 1 Application topically daily. Mayer, Jodi R, NP  Active   montelukast  (SINGULAIR ) 10 MG tablet [id number]  Take 10 mg by mouth at bedtime as needed. [provider]  Active   Multiple  Vitamins-Minerals (ONE-A-DAY WOMENS PO) [id number]  Take 1 tablet by mouth daily. [provider]  Active   Spacer/Aero-Holding Chambers (AEROCHAMBER MV) inhaler [id number]  Use as instructed Edman Marsa PARAS, DO  Active             Recommendation:   {RECOMMENDATONS:32554}  Follow Up Plan:   {FOLLOWUP:32559}  SIG ***

## 2024-09-11 ENCOUNTER — Ambulatory Visit: Payer: Self-pay | Admitting: Family Medicine

## 2024-09-11 MED ORDER — VITAFOL GUMMIES 3.33-0.333-34.8 MG PO CHEW: 3.0000 | CHEWABLE_TABLET | Freq: Every day | ORAL | 5 refills | Status: DC

## 2024-09-11 NOTE — Patient Instructions (Signed)
 Thank you for allowing the Complex Care Management team to participate in your care. It was great speaking with you!  Congratulations!! I'm so glad to hear that you are feeling great and receiving support.  Please be sure to complete all required appointments, labs and ultrasounds with the Ob/Gyn team as scheduled.  Please do not hesitate to contact the clinic if your health needs change and you require additional outreach. The care management team will gladly assist.   Visit Information  [name] was given information about Medicaid Managed Care team care coordination services as a part of their Healthy [facility] Medicaid benefit. Jasmine Gordon   If you would like to schedule transportation through your Healthy [facility] plan, please call the following number at least 2 days in advance of your appointment: [phone]  For information about your ride after you set it up, call Ride Assist at [phone]. Use this number to activate a Will Call pickup, or if your transportation is late for a scheduled pickup. Use this number, too, if you need to make a change or cancel a previously scheduled reservation.  If you need transportation services right away, call [phone]. The after-hours call center is staffed 24 hours to handle ride assistance and urgent reservation requests (including discharges) 365 days a year. Urgent trips include sick visits, hospital discharge requests and life-sustaining treatment.  Call the [facility] Line at [phone], at any time, 24 hours a day, 7 days a week. If you are in danger or need immediate medical attention call 911.   Goals Addressed             This Visit's Progress    COMPLETED: VBCI RNCM:  Monitor, Self-Manage And Reduce Symptoms of HLD       Problems:  Chronic Disease Management support and education needs related to Hyperlipidemia  Goal: Over the next 3 months the patient will: Attend all scheduled medical  appointments Demonstrate ongoing adherence to prescribed treatment plan for HLD  Interventions:  Hyperlipidemia Interventions:(Status: Goal on track:  Yes.) Long Term Goal  Reviewed established cholesterol goals. Reports compliance with medications and nutritional recommendations. Reports tolerating exercises/walks regularly without complications. Discussed recent changes with pregnancy. Current plan is to transition care to the Obstetrics team. She will contact the primary care clinic if needed.   Reports receiving excellent support from her boyfriend which has improved her mood and overall outlook. Declined current concerns related to nutrition, transportation or housing. Reports starting prenatal vitamins and supplements as advised and completing needed application for [facility] services. Reviewed importance of following recommended nutrition recommendations and activity restrictions as advised by the Obstetrics team. Reviewed worsening symptoms and indications for seeking immediate medical attention.      Patient Self-Care Activities:  Complete labs as ordered Attend all scheduled provider appointments Read nutrition labels. Eat whole grains, fruits and vegetables, lean meats and healthy fats Limit intake of sodium and cholesterol Call provider office for new concerns or questions   Plan:  The patient has been provided with contact information for the care management team and has been advised to call with any health related questions or concerns.           Please see education materials related to Influenza provided by MyChart link.  Patient verbalizes understanding of instructions and care plan provided today and agrees to view in MyChart. Active MyChart status and patient understanding of how to access instructions and care plan via MyChart confirmed with patient.  [name] has been provided with contact information for the  care management team. Agreed to call or contact the  clinic if additional outreach is required.    Jackson Acron [facility] Population Health RN Care Manager Direct Dial: [phone]  Fax: [phone] Website: [email or web address]

## 2024-09-13 ENCOUNTER — Ambulatory Visit

## 2024-09-24 DIAGNOSIS — Z3201 Encounter for pregnancy test, result positive: Secondary | ICD-10-CM | POA: Diagnosis not present

## 2024-09-24 DIAGNOSIS — R1084 Generalized abdominal pain: Secondary | ICD-10-CM | POA: Diagnosis not present

## 2024-09-27 ENCOUNTER — Telehealth (INDEPENDENT_AMBULATORY_CARE_PROVIDER_SITE_OTHER)

## 2024-09-27 DIAGNOSIS — O3680X0 Pregnancy with inconclusive fetal viability, not applicable or unspecified: Secondary | ICD-10-CM

## 2024-09-27 DIAGNOSIS — Z3689 Encounter for other specified antenatal screening: Secondary | ICD-10-CM

## 2024-09-27 DIAGNOSIS — Z3480 Encounter for supervision of other normal pregnancy, unspecified trimester: Secondary | ICD-10-CM | POA: Insufficient documentation

## 2024-09-27 NOTE — Progress Notes (Signed)
 New OB Intake  I connected with  Jasmine Gordon on 09/27/24 at  2:15 PM EST by telephone Video Visit and verified that I am speaking with the correct person using two identifiers. Nurse is located at [facility] and [place]  I discussed the limitations, risks, security and privacy concerns of performing an evaluation and management service by telephone and the availability of in person appointments. I also discussed with the patient that there may be a patient responsible charge related to this service. The patient expressed understanding and agreed to proceed.  I explained I am completing New OB Intake today. We discussed her EDD of 05/15/2025 that is based on LMP of 08/08/2024. Pt is G4/P3003. I reviewed her allergies, medications, Medical/Surgical/OB history, and appropriate screenings. There are no cats in the home.. Based on history, this is a pregnancy complicated by advanced maternal age, prediabetes, obesity, iron  deficiency anemia, and latent TB with negative chest xray 02/26/24.  Patient Active Problem List   Diagnosis Date Noted   Latent tuberculosis 06/14/2023   Dysplasia of cervix, high grade CIN 2 02/23/2023   High grade squamous intraepithelial lesion (HGSIL), grade 3 CIN, on biopsy of cervix 02/23/2023   Vitamin B12 deficiency 06/09/2022   Palpitations 01/21/2022   Shortness of breath 01/21/2022   Chest pain 01/21/2022   Generalized anxiety disorder with panic attacks 09/27/2021   Environmental and seasonal allergies 11/04/2020   Genital warts 09/11/2018   Positive TB test 09/25/2017   Allergic contact dermatitis due to metals 05/17/2017   Pre-diabetes 03/06/2017   Hyperlipidemia 03/06/2017   Allergic reaction 02/08/2017   Urticaria due to food allergy 02/08/2017   Vitamin D  deficiency 11/08/2016   Iron  deficiency anemia due to chronic blood loss 10/03/2016   Menorrhagia with regular cycle 06/27/2016   Mild persistent asthma without complication  06/17/2016   Anxiety and depression 06/17/2016   Morbid obesity with BMI of 40.0-44.9, adult (HCC) 06/17/2016   PTSD (post-traumatic stress disorder) 2016    Concerns addressed today: None.  Patient is doing well with no concerns.  Delivery Plans:  Plans to deliver at [facility].  Anatomy US  Explained first scheduled US  will be 10/21/24. Anatomy US  will be scheduled around [redacted] weeks gestational age.  Labs Discussed genetic screening with patient. Patient desires genetic testing to be drawn at new OB visit. Discussed possible labs to be drawn at new OB appointment.  COVID Vaccine Patient has had COVID vaccine.   Social Determinants of Health Food Insecurity: denies food insecurity WIC Referral: Patient is not interested in referral to [facility].  Transportation: Patient denies transportation needs. Childcare: Discussed no children allowed at ultrasound appointments.   First visit review I reviewed new OB appt with pt. I explained she will have blood work and pap smear/pelvic exam if indicated. Explained pt will be seen by Harlene Cisco CNM at first visit; encounter routed to appropriate provider.   Rollo FORBES Louder, RN 09/27/2024  9:07 AM

## 2024-10-02 ENCOUNTER — Telehealth: Payer: Self-pay

## 2024-10-02 ENCOUNTER — Other Ambulatory Visit: Payer: Self-pay

## 2024-10-02 ENCOUNTER — Emergency Department
Admission: EM | Admit: 2024-10-02 | Discharge: 2024-10-02 | Disposition: A | Discharge status: Home / Self Care | Attending: Emergency Medicine | Admitting: Emergency Medicine

## 2024-10-02 ENCOUNTER — Emergency Department

## 2024-10-02 ENCOUNTER — Encounter: Payer: Self-pay | Admitting: Emergency Medicine

## 2024-10-02 DIAGNOSIS — R0981 Nasal congestion: Secondary | ICD-10-CM | POA: Insufficient documentation

## 2024-10-02 DIAGNOSIS — O368910 Maternal care for other specified fetal problems, first trimester, not applicable or unspecified: Secondary | ICD-10-CM | POA: Diagnosis not present

## 2024-10-02 DIAGNOSIS — Z3A08 8 weeks gestation of pregnancy: Secondary | ICD-10-CM | POA: Diagnosis not present

## 2024-10-02 DIAGNOSIS — O09511 Supervision of elderly primigravida, first trimester: Secondary | ICD-10-CM | POA: Diagnosis not present

## 2024-10-02 DIAGNOSIS — O3481 Maternal care for other abnormalities of pelvic organs, first trimester: Secondary | ICD-10-CM | POA: Diagnosis not present

## 2024-10-02 DIAGNOSIS — R059 Cough, unspecified: Secondary | ICD-10-CM | POA: Insufficient documentation

## 2024-10-02 DIAGNOSIS — O4690 Antepartum hemorrhage, unspecified, unspecified trimester: Secondary | ICD-10-CM

## 2024-10-02 DIAGNOSIS — O209 Hemorrhage in early pregnancy, unspecified: Secondary | ICD-10-CM | POA: Diagnosis not present

## 2024-10-02 DIAGNOSIS — O200 Threatened abortion: Secondary | ICD-10-CM | POA: Insufficient documentation

## 2024-10-02 DIAGNOSIS — O208 Other hemorrhage in early pregnancy: Secondary | ICD-10-CM | POA: Diagnosis not present

## 2024-10-02 DIAGNOSIS — Z3A01 Less than 8 weeks gestation of pregnancy: Secondary | ICD-10-CM | POA: Diagnosis not present

## 2024-10-02 LAB — URINALYSIS, ROUTINE W REFLEX MICROSCOPIC
Bilirubin Urine: NEGATIVE
Glucose, UA: NEGATIVE mg/dL
Hgb urine dipstick: NEGATIVE
Ketones, ur: NEGATIVE mg/dL
Leukocytes,Ua: NEGATIVE
Nitrite: NEGATIVE
Protein, ur: NEGATIVE mg/dL
Specific Gravity, Urine: 1.013 (ref 1.005–1.030)
pH: 8 (ref 5.0–8.0)

## 2024-10-02 LAB — CBC WITH DIFFERENTIAL/PLATELET
Abs Immature Granulocytes: 0.03 K/uL (ref 0.00–0.07)
Basophils Absolute: 0 K/uL (ref 0.0–0.1)
Basophils Relative: 0 %
Eosinophils Absolute: 0.1 K/uL (ref 0.0–0.5)
Eosinophils Relative: 2 %
HCT: 36.1 % (ref 36.0–46.0)
Hemoglobin: 11.4 g/dL — ABNORMAL LOW (ref 12.0–15.0)
Immature Granulocytes: 1 %
Lymphocytes Relative: 22 %
Lymphs Abs: 1.3 K/uL (ref 0.7–4.0)
MCH: 26.5 pg (ref 26.0–34.0)
MCHC: 31.6 g/dL (ref 30.0–36.0)
MCV: 84 fL (ref 80.0–100.0)
Monocytes Absolute: 0.4 K/uL (ref 0.1–1.0)
Monocytes Relative: 6 %
Neutro Abs: 4.1 K/uL (ref 1.7–7.7)
Neutrophils Relative %: 69 %
Platelets: 221 K/uL (ref 150–400)
RBC: 4.3 MIL/uL (ref 3.87–5.11)
RDW: 14 % (ref 11.5–15.5)
WBC: 5.9 K/uL (ref 4.0–10.5)
nRBC: 0 % (ref 0.0–0.2)

## 2024-10-02 LAB — COMPREHENSIVE METABOLIC PANEL WITH GFR
ALT: 18 U/L (ref 0–44)
AST: 17 U/L (ref 15–41)
Albumin: 4 g/dL (ref 3.5–5.0)
Alkaline Phosphatase: 48 U/L (ref 38–126)
Anion gap: 11 (ref 5–15)
BUN: 7 mg/dL (ref 6–20)
CO2: 21 mmol/L — ABNORMAL LOW (ref 22–32)
Calcium: 9.5 mg/dL (ref 8.9–10.3)
Chloride: 105 mmol/L (ref 98–111)
Creatinine, Ser: 0.6 mg/dL (ref 0.44–1.00)
GFR, Estimated: >60 mL/min (ref >60)
Glucose, Bld: 108 mg/dL — ABNORMAL HIGH (ref 70–99)
Potassium: 4 mmol/L (ref 3.5–5.1)
Sodium: 137 mmol/L (ref 135–145)
Total Bilirubin: <0.2 mg/dL (ref 0.0–1.2)
Total Protein: 6.9 g/dL (ref 6.5–8.1)

## 2024-10-02 LAB — RESP PANEL BY RT-PCR (RSV, FLU A&B, COVID)  RVPGX2
Influenza A by PCR: NEGATIVE
Influenza B by PCR: NEGATIVE
Resp Syncytial Virus by PCR: NEGATIVE
SARS Coronavirus 2 by RT PCR: NEGATIVE

## 2024-10-02 LAB — ABO/RH: ABO/RH(D): O POS

## 2024-10-02 LAB — HCG, QUANTITATIVE, PREGNANCY: hCG, Beta Chain, Quant, S: 57101 m[IU]/mL — ABNORMAL HIGH (ref <5)

## 2024-10-02 NOTE — ED Provider Notes (Signed)
 [facility] Provider Note    Event Date/Time   First MD Initiated Contact with Patient 10/02/24 1127     (approximate)  History   Chief Complaint: Vaginal Bleeding  HPI  Jasmine Gordon is a 38 y.o. female G4, P3 with a past medical history of anemia, anxiety, gastric reflux, hyperlipidemia, approximately [redacted] weeks pregnant presents to the emergency department with vaginal spotting.  According to the patient she is approximately [redacted] weeks pregnant she states over the last 3 days she has been noting some vaginal spotting.  She called her OB 3 days ago and they told her to monitor it.  Patient states the spotting seems somewhat worse this morning she called them back again but could not reach them so she came to the emergency department.  Patient states she has been experiencing some mild cough nasal congestion and generalized bodyaches over the last few days as well.  Patient follows up with [facility] but was unable to reach them today before coming to the emergency department and they have since called her back for patient while she is here.  Physical Exam   Triage Vital Signs: ED Triage Vitals [10/02/24 1044]  Encounter Vitals Group     BP (!) 150/111     Girls Systolic BP Percentile      Girls Diastolic BP Percentile      Boys Systolic BP Percentile      Boys Diastolic BP Percentile      Pulse Rate (!) 117     Resp 17     Temp 98.9 F (37.2 C)     Temp Source Oral     SpO2 100 %     Weight 266 lb (120.7 kg)     Height 5' 6 (1.676 m)     Head Circumference      Peak Flow      Pain Score 6     Pain Loc      Pain Education      Exclude from Growth Chart     Most recent vital signs: Vitals:   10/02/24 1044  BP: (!) 150/111  Pulse: (!) 117  Resp: 17  Temp: 98.9 F (37.2 C)  SpO2: 100%    General: Awake, no distress.  CV:  Good peripheral perfusion.  Regular rate and rhythm  Resp:  Normal effort.  Equal breath sounds bilaterally.   Abd:  No distention.  Soft, nontender.  No rebound or guarding.  ED Results / Procedures / Treatments   RADIOLOGY  Ultrasound shows the 8-week 0-day live IUP with a heart rate of 169.  Small subchorionic hemorrhage.   MEDICATIONS ORDERED IN ED: Medications - No data to display   IMPRESSION / MDM / ASSESSMENT AND PLAN / ED COURSE  I reviewed the triage vital signs and the nursing notes.  Patient's presentation is most consistent with acute presentation with potential threat to life or bodily function.  Patient presents emergency department approximately [redacted] weeks pregnant with vaginal spotting.  Patient also states she has been experiencing some generalized bodyaches nasal congestion and cough for the last few days.  Patient is labwork today shows a reassuring CBC with a normal white blood cell count.  Patient's urinalysis is normal.  Beta-hCG is resulted +57,000.  Given her nasal congestion and cough COVID swab is pending.  A chemistry has been added on and we will obtain a pelvic/OB ultrasound given her vaginal bleeding/spotting.  Patient's blood type is O+ does  not require RhoGAM.  Patient is somewhat hypertensive 150/111.  Patient states her blood pressure is usually normal.  Discussed with the patient she needs to follow-up with her OB regarding the blood pressure.  Ultrasound reassuring with small subchorionic hemorrhage.  Patient does have a 5.7 cm right sided ovarian cyst which I discussed with the patient to follow-up with her OB for repeat ultrasound in 4 weeks.  Patient agreeable to plan.  FINAL CLINICAL IMPRESSION(S) / ED DIAGNOSES   Threatened miscarriage Subchorionic hemorrhage  Note:  This document was prepared using Dragon voice recognition software and may include unintentional dictation errors.   Dorothyann Drivers, MD 10/02/24 1339

## 2024-10-02 NOTE — Telephone Encounter (Signed)
 Called patient she is already at ED.

## 2024-10-02 NOTE — Telephone Encounter (Signed)
 Patient called yesterday with concerns of spotting with pregnancy. I explained bleeding precautions to her, she verbalized understanding. She called back today with concerns of increased bleeding, but she is not soaking a pad every 30 minutes to 1 hour, but bleeding has increased and she has very mild cramping. Explained bleeding precautions to her again and she was very upset and felt like there was something more that needed to be done. I recommended that she go to ED for further evaluation, if she thinks that something else was going on. She verbalized understanding.

## 2024-10-02 NOTE — Discharge Instructions (Signed)
 Your ultrasound shows a 5.7 cm right ovarian cyst.  Please follow-up with your OB for repeat ultrasound in 4 weeks for further evaluation.  Return to the emergency department for any significant abdominal pain significant vaginal bleeding or any other symptom personally concerning to yourself.

## 2024-10-02 NOTE — ED Triage Notes (Signed)
 Patient to ED via POV for vaginal spotting and abd cramping x3 days. States [redacted] weeks pregnant tomorrow. States overall not feeling well.

## 2024-10-03 ENCOUNTER — Telehealth: Payer: Self-pay

## 2024-10-03 ENCOUNTER — Ambulatory Visit: Admitting: Licensed Practical Nurse

## 2024-10-03 ENCOUNTER — Encounter: Payer: Self-pay | Admitting: Licensed Practical Nurse

## 2024-10-03 VITALS — BP 126/83 | HR 132 | Wt 268.2 lb

## 2024-10-03 DIAGNOSIS — O161 Unspecified maternal hypertension, first trimester: Secondary | ICD-10-CM

## 2024-10-03 DIAGNOSIS — F32A Depression, unspecified: Secondary | ICD-10-CM

## 2024-10-03 DIAGNOSIS — Z3A01 Less than 8 weeks gestation of pregnancy: Secondary | ICD-10-CM | POA: Diagnosis not present

## 2024-10-03 DIAGNOSIS — O99341 Other mental disorders complicating pregnancy, first trimester: Secondary | ICD-10-CM

## 2024-10-03 DIAGNOSIS — F418 Other specified anxiety disorders: Secondary | ICD-10-CM | POA: Diagnosis not present

## 2024-10-03 NOTE — Progress Notes (Signed)
 Return Prenatal Note   Subjective   38 y.o. H5E6996 at [redacted]w[redacted]d presents for a problem visit.  Patient Here with her client Jasmine Gordon.  Patient reports: here fore evaluation for high BP with home cuff.  Jasmine Gordon has been feeling pressure on the top of her head for a few weeks, she took her BP today with a cuff she bought off of [employer] all of her readings were 140's-160's/90's, she then called the office to see what to do.  Pt states her vision is no more blurry than it has been, denies RUQ pain, and hx CHTN.  She did have something like a cold about a week ago, she just does not feel like herself.  Admits to hx of Anxiety,depression and PTSD-she was supposed to get a psychiatrist but has not heard from anyone. She has Atarax  PRN. She likes to read and spend time outdoors to mange her mood. Over the past few moths she has been under great stress-she is recently divorced. Reports she feels lonely. She does not have any adult interaction most of her days, her new partner does not come home until after she is in bed. Again she states I don't feel my best. She notes lately when she is warm, she does not fele well, but once she is in a cooler setting she is ok.   Of note she was seen in the ED for vaginal bleeding, an US  showed an SIUP at 8wks with a subchorionic hemorrhage. Her initial BP was 150/111 but overtime it normalized. Her labs and respiratory panel were WNL.  Pt;s upper arm with cone shape, fullest part measures 15inches    Movement: Absent Contractions: Not present  Objective   Flow sheet Vitals: Pulse Rate: (!) 132 BP: 126/83 Total weight gain: Not found.  General Appearance  No acute distress, well appearing, and well nourished Pulmonary   Normal work of breathing, CTAB [place]                                     RRR, no skip, murmurs or gallops  Neurologic   Alert and oriented to person, place, and time Psychiatric   Mood and affect within normal limits appears sad     Assessment/Plan   Plan  38 y.o. G4P3003 at [redacted]w[redacted]d presents for a problem visit . Reviewed prenatal record including previous visit note.  Anxiety and depression -encouraged pt to call the number on the back of her card to access mental health -encouraged pt to look into joining a meet up so that she can meet other people  -encouraged mindfulness activities and reading Mindful Birthing   Elevated blood pressure affecting pregnancy in first trimester, antepartum -Normotensive today, labs collected -x large cuff used in clinic, rec obtaining x large cuff for home use, please call with any reading >140/90 -discussed she does not have HTN right now, but it could develop as the pregnancy progresses.       Orders Placed This Encounter  Procedures   Comprehensive metabolic panel with GFR   CBC w/Diff/Platelet   Protein / creatinine ratio, urine   Return for keep scheduled NOB .   Future Appointments  Date Time Provider Department Center  10/21/2024  9:15 AM [facility] US  1 [facility] None  10/31/2024  2:55 PM Jayne Raisin L, CNM [facility] None  12/17/2024  8:00 AM [facility] LAB [facility] None  12/18/2024  2:30 PM Babara,  Zel[place], MD [facility] None  12/18/2024  3:00 PM [facility]- [facility] INFUSION CHAIR 17 [facility] None    For next visit:  continue with routine prenatal care     Jasmine Gordon [facility], CNM  11/13/253:30 PM

## 2024-10-03 NOTE — Assessment & Plan Note (Signed)
-  Normotensive today, labs collected -x large cuff used in clinic, rec obtaining x large cuff for home use, please call with any reading >140/90 -discussed she does not have HTN right now, but it could develop as the pregnancy progresses.

## 2024-10-03 NOTE — Telephone Encounter (Signed)
 Patient called triage with concerns of high blood pressure readings at home. Secure sent to on call provider Lydia:  This patient called triage with blood pressure concerns. 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 Readings are: 7:30 am 144/91 12:59 pm 146/95 1:10 pm 165/93 1:31 pm 154/96 1:38 pm 121/93. I asked her if she took some time sitting down relaxed before checking her blood pressure and she said kinda, not really. Denies headaches but her head feels off. Denies spots in vision or blurred vision. she was at the ER yesterday for vaginal spotting. Can you pls advise?   Per Jinnie, she can come see her here at clinic at 2:30 pm. Patient has been scheduled.

## 2024-10-03 NOTE — Assessment & Plan Note (Signed)
-  encouraged pt to call the number on the back of her card to access mental health -encouraged pt to look into joining a meet up so that she can meet other people  -encouraged mindfulness activities and reading Mindful Birthing

## 2024-10-04 ENCOUNTER — Ambulatory Visit: Payer: Self-pay | Admitting: Licensed Practical Nurse

## 2024-10-04 LAB — COMPREHENSIVE METABOLIC PANEL WITH GFR
ALT: 21 IU/L (ref 0–32)
AST: 17 IU/L (ref 0–40)
Albumin: 4.2 g/dL (ref 3.9–4.9)
Alkaline Phosphatase: 48 IU/L (ref 41–116)
BUN/Creatinine Ratio: 13 (ref 9–23)
BUN: 9 mg/dL (ref 6–20)
Bilirubin Total: <0.2 mg/dL (ref 0.0–1.2)
CO2: 19 mmol/L — ABNORMAL LOW (ref 20–29)
Calcium: 9.5 mg/dL (ref 8.7–10.2)
Chloride: 102 mmol/L (ref 96–106)
Creatinine, Ser: 0.69 mg/dL (ref 0.57–1.00)
Globulin, Total: 2.3 g/dL (ref 1.5–4.5)
Glucose: 98 mg/dL (ref 70–99)
Potassium: 3.9 mmol/L (ref 3.5–5.2)
Sodium: 135 mmol/L (ref 134–144)
Total Protein: 6.5 g/dL (ref 6.0–8.5)
eGFR: 115 mL/min/1.73 (ref >59)

## 2024-10-04 LAB — CBC WITH DIFFERENTIAL/PLATELET
Basophils Absolute: 0 x10E3/uL (ref 0.0–0.2)
Basos: 1 %
EOS (ABSOLUTE): 0.1 x10E3/uL (ref 0.0–0.4)
Eos: 1 %
Hematocrit: 35.8 % (ref 34.0–46.6)
Hemoglobin: 11.3 g/dL (ref 11.1–15.9)
Immature Grans (Abs): 0 x10E3/uL (ref 0.0–0.1)
Immature Granulocytes: 0 %
Lymphocytes Absolute: 1.4 x10E3/uL (ref 0.7–3.1)
Lymphs: 22 %
MCH: 26.7 pg (ref 26.6–33.0)
MCHC: 31.6 g/dL (ref 31.5–35.7)
MCV: 85 fL (ref 79–97)
Monocytes Absolute: 0.4 x10E3/uL (ref 0.1–0.9)
Monocytes: 6 %
Neutrophils Absolute: 4.4 x10E3/uL (ref 1.4–7.0)
Neutrophils: 69 %
Platelets: 232 x10E3/uL (ref 150–450)
RBC: 4.23 x10E6/uL (ref 3.77–5.28)
RDW: 13.9 % (ref 11.7–15.4)
WBC: 6.3 x10E3/uL (ref 3.4–10.8)

## 2024-10-04 LAB — PROTEIN / CREATININE RATIO, URINE
Creatinine, Urine: 134.8 mg/dL
Protein, Ur: 7.2 mg/dL
Protein/Creat Ratio: 53 mg/g{creat} (ref 0–200)

## 2024-10-15 ENCOUNTER — Emergency Department

## 2024-10-15 ENCOUNTER — Emergency Department: Admission: EM | Admit: 2024-10-15 | Discharge: 2024-10-15 | Disposition: A | Discharge status: Home / Self Care

## 2024-10-15 ENCOUNTER — Other Ambulatory Visit: Payer: Self-pay

## 2024-10-15 DIAGNOSIS — R109 Unspecified abdominal pain: Secondary | ICD-10-CM

## 2024-10-15 DIAGNOSIS — H5213 Myopia, bilateral: Secondary | ICD-10-CM | POA: Diagnosis not present

## 2024-10-15 DIAGNOSIS — O208 Other hemorrhage in early pregnancy: Secondary | ICD-10-CM | POA: Diagnosis not present

## 2024-10-15 DIAGNOSIS — R140 Abdominal distension (gaseous): Secondary | ICD-10-CM | POA: Insufficient documentation

## 2024-10-15 DIAGNOSIS — O26891 Other specified pregnancy related conditions, first trimester: Secondary | ICD-10-CM | POA: Diagnosis not present

## 2024-10-15 DIAGNOSIS — O3481 Maternal care for other abnormalities of pelvic organs, first trimester: Secondary | ICD-10-CM | POA: Diagnosis not present

## 2024-10-15 DIAGNOSIS — O09511 Supervision of elderly primigravida, first trimester: Secondary | ICD-10-CM | POA: Diagnosis not present

## 2024-10-15 DIAGNOSIS — Z3A10 10 weeks gestation of pregnancy: Secondary | ICD-10-CM | POA: Insufficient documentation

## 2024-10-15 DIAGNOSIS — Z3A01 Less than 8 weeks gestation of pregnancy: Secondary | ICD-10-CM | POA: Diagnosis not present

## 2024-10-15 DIAGNOSIS — R110 Nausea: Secondary | ICD-10-CM | POA: Insufficient documentation

## 2024-10-15 DIAGNOSIS — N838 Other noninflammatory disorders of ovary, fallopian tube and broad ligament: Secondary | ICD-10-CM | POA: Diagnosis not present

## 2024-10-15 LAB — URINALYSIS, COMPLETE (UACMP) WITH MICROSCOPIC
Bilirubin Urine: NEGATIVE
Glucose, UA: NEGATIVE mg/dL
Hgb urine dipstick: NEGATIVE
Ketones, ur: NEGATIVE mg/dL
Leukocytes,Ua: NEGATIVE
Nitrite: NEGATIVE
Protein, ur: NEGATIVE mg/dL
Specific Gravity, Urine: 1.026 (ref 1.005–1.030)
pH: 6 (ref 5.0–8.0)

## 2024-10-15 LAB — CBC
HCT: 35.6 % — ABNORMAL LOW (ref 36.0–46.0)
Hemoglobin: 11.3 g/dL — ABNORMAL LOW (ref 12.0–15.0)
MCH: 26.7 pg (ref 26.0–34.0)
MCHC: 31.7 g/dL (ref 30.0–36.0)
MCV: 84 fL (ref 80.0–100.0)
Platelets: 229 K/uL (ref 150–400)
RBC: 4.24 MIL/uL (ref 3.87–5.11)
RDW: 13.7 % (ref 11.5–15.5)
WBC: 6.9 K/uL (ref 4.0–10.5)
nRBC: 0 % (ref 0.0–0.2)

## 2024-10-15 LAB — BASIC METABOLIC PANEL WITH GFR
Anion gap: 12 (ref 5–15)
BUN: 9 mg/dL (ref 6–20)
CO2: 20 mmol/L — ABNORMAL LOW (ref 22–32)
Calcium: 9.3 mg/dL (ref 8.9–10.3)
Chloride: 103 mmol/L (ref 98–111)
Creatinine, Ser: 0.62 mg/dL (ref 0.44–1.00)
GFR, Estimated: >60 mL/min (ref >60)
Glucose, Bld: 95 mg/dL (ref 70–99)
Potassium: 3.9 mmol/L (ref 3.5–5.1)
Sodium: 135 mmol/L (ref 135–145)

## 2024-10-15 LAB — HCG, QUANTITATIVE, PREGNANCY: hCG, Beta Chain, Quant, S: 64619 m[IU]/mL — ABNORMAL HIGH (ref <5)

## 2024-10-15 LAB — POC URINE PREG, ED: Preg Test, Ur: POSITIVE — AB

## 2024-10-15 MED ORDER — ONDANSETRON 4 MG PO TBDP: 4.0000 mg | ORAL_TABLET | Freq: Three times a day (TID) | ORAL | 0 refills | Status: AC | PRN

## 2024-10-15 NOTE — ED Triage Notes (Signed)
 Patient states she is approximately [redacted] weeks pregnant; has had excessive salivation, bladder fullness and nausea but today all symptoms subsided so she feels she is no longer pregnant. Patient denies vaginal bleeding.

## 2024-10-15 NOTE — Discharge Instructions (Addendum)
 Your ultrasound is pending.  You will receive a call with the results tomorrow.  Follow-up with your OB/GYN for further evaluation.

## 2024-10-15 NOTE — ED Provider Notes (Signed)
 [facility] Emergency Department Provider Note     Event Date/Time   First MD Initiated Contact with Patient 10/15/24 2010     (approximate)   History   Medical Complaint   HPI  Jasmine Gordon is a 38 y.o. female G4P3 approximately [redacted] weeks pregnant with a past medical history of anemia, anxiety, GERD, OSA and HLD presents to the ED with complaint of  feeling like she is no longer pregnant.  She reports feeling that her bladder is full with nausea and abdominal bloating.  Denies urinary symptoms such as dysuria, hematuria or vaginal bleeding.   Chart reviewed -patient seen in the ED for complaint of vaginal spotting on 11/12.  Full workup performed and overall reassuring.     Physical Exam   Triage Vital Signs: ED Triage Vitals  Encounter Vitals Group     BP 10/15/24 1807 139/77     Girls Systolic BP Percentile --      Girls Diastolic BP Percentile --      Boys Systolic BP Percentile --      Boys Diastolic BP Percentile --      Pulse Rate 10/15/24 1807 (!) 118     Resp 10/15/24 1807 18     Temp 10/15/24 1807 99 F (37.2 C)     Temp Source 10/15/24 1807 Oral     SpO2 10/15/24 1807 100 %     Weight --      Height --      Head Circumference --      Peak Flow --      Pain Score 10/15/24 1806 0     Pain Loc --      Pain Education --      Exclude from Growth Chart --     Most recent vital signs: Vitals:   10/15/24 1807  BP: 139/77  Pulse: (!) 118  Resp: 18  Temp: 99 F (37.2 C)  SpO2: 100%    General Awake, no distress.  HEENT NCAT.  CV:  Good peripheral perfusion.  RESP:  Normal effort.  ABD:  No distention.  Soft, nontender to palpation.   ED Results / Procedures / Treatments   Labs (all labs ordered are listed, but only abnormal results are displayed) Labs Reviewed  URINALYSIS, COMPLETE (UACMP) WITH MICROSCOPIC - Abnormal; Notable for the following components:      Result Value   Color, Urine YELLOW (*)     APPearance HAZY (*)    Bacteria, UA RARE (*)    All other components within normal limits  HCG, QUANTITATIVE, PREGNANCY - Abnormal; Notable for the following components:   hCG, Beta Chain, Quant, S 35,380 (*)    All other components within normal limits  CBC - Abnormal; Notable for the following components:   Hemoglobin 11.3 (*)    HCT 35.6 (*)    All other components within normal limits  BASIC METABOLIC PANEL WITH GFR - Abnormal; Notable for the following components:   CO2 20 (*)    All other components within normal limits  POC URINE PREG, ED - Abnormal; Notable for the following components:   Preg Test, Ur POSITIVE (*)    All other components within normal limits   RADIOLOGY  I personally viewed and evaluated these images as part of my medical decision making, as well as reviewing the written report by the radiologist.  US  OB Comp < 14 Wks Result Date: 10/15/2024 EXAM: OBSTETRIC ULTRASOUND FIRST TRIMESTER TECHNIQUE:  Transabdominal first trimester obstetric pelvic duplex ultrasound was performed with real-time imaging and color flow imaging. COMPARISON: Comparison exam 10/02/2024. CLINICAL HISTORY: vaginal spotting with known pregnancy. FINDINGS: UTERUS: The uterus is well visualized. No focal myometrial mass. GESTATIONAL SAC(S): Single intrauterine gestational sac. Moderate subchorionic hemorrhage is noted, measuring 3.5 x 1.5 x 1.2 cm. This has increased in the interval from the prior exam. YOLK SAC: Absent EMBRYO(<11WK) /FETUS(>=11WK): The embryo is noted within with positive fetal cardiac activity. CROWN RUMP LENGTH: 32 mm corresponding to a gestational age of [redacted] weeks 0 days. RATE OF CARDIAC ACTIVITY: 166 beats per minute. RIGHT OVARY: The right ovary again demonstrates a complex cystic lesion measuring up to 5.4 cm. LEFT OVARY: The left ovary is well visualized and within normal limits. FREE FLUID: No free fluid. MEASUREMENTS ESTIMATED GESTATIONAL AGE BY CURRENT ULTRASOUND: 10 weeks 0  days. ESTIMATED GESTATIONAL AGE BY LMP/PRIOR ULTRASOUND: 9 weeks, 5 days ESTIMATED DUE DATE: 05/13/2025. IMPRESSION: 1. Intrauterine pregnancy with embryonic/fetal cardiac activity, gestational age [redacted] weeks 0 days, and estimated date of confinement 05/13/2025. 2. Moderate subchorionic hemorrhage, increased from the prior exam, measuring 3.5 x 1.5 x 1.2 cm. 3. Complex cystic lesion in the right ovary measuring up to 5.4 cm. Stable from the prior exam Electronically signed by: Oneil Devonshire MD 10/15/2024 11:42 PM EST RP Workstation: [id number]    PROCEDURES:  Critical Care performed: No  Procedures   MEDICATIONS ORDERED IN ED: Medications - No data to display   IMPRESSION / MDM / ASSESSMENT AND PLAN / ED COURSE  I reviewed the triage vital signs and the nursing notes.                               38 y.o. female presents to the emergency department for evaluation and treatment of approximately [redacted] weeks pregnant with complaint of  . See HPI for further details.   Differential diagnosis includes, but is not limited to, threatened miscarriage, incomplete miscarriage, normal bleeding from an early trimester pregnancy, ectopic pregnancy, , blighted ovum, vaginal/cervical trauma, subchorionic hemorrhage/hematoma, etc.   Patient's presentation is most consistent with acute complicated illness / injury requiring diagnostic workup.  Workup reassuring.  Physical exam findings are overall benign.  Ultrasound shows moderate subchorionic hemorrhage otherwise intrauterine pregnancy.  Patient has an appointment on 12/01 with OB/GYN.  Advised to discuss findings at today's visit for further management.  She verbalized understanding.  Zofran  sent to pharmacy for nausea. ED precautions discussed. All questions and concerns were addressed during this ED visit.     FINAL CLINICAL IMPRESSION(S) / ED DIAGNOSES   Final diagnoses:  Abdominal pressure   Rx / DC Orders   ED Discharge Orders          Ordered     ondansetron  (ZOFRAN -ODT) 4 MG disintegrating tablet  Every 8 hours PRN        10/15/24 2335             Note:  This document was prepared using Dragon voice recognition software and may include unintentional dictation errors.    Margrette, Markia Kyer A, PA-C 10/15/24 2352    Clarine Ozell LABOR, MD 10/15/24 [phone]

## 2024-10-16 ENCOUNTER — Encounter: Payer: Self-pay | Admitting: Certified Nurse Midwife

## 2024-10-21 ENCOUNTER — Ambulatory Visit

## 2024-10-21 DIAGNOSIS — O3680X0 Pregnancy with inconclusive fetal viability, not applicable or unspecified: Secondary | ICD-10-CM | POA: Diagnosis not present

## 2024-10-21 DIAGNOSIS — Z3480 Encounter for supervision of other normal pregnancy, unspecified trimester: Secondary | ICD-10-CM

## 2024-10-21 DIAGNOSIS — Z3A10 10 weeks gestation of pregnancy: Secondary | ICD-10-CM | POA: Diagnosis not present

## 2024-10-31 ENCOUNTER — Other Ambulatory Visit (HOSPITAL_COMMUNITY)
Admission: RE | Admit: 2024-10-31 | Discharge: 2024-10-31 | Disposition: A | Source: Ambulatory Visit | Attending: Certified Nurse Midwife | Admitting: Certified Nurse Midwife

## 2024-10-31 ENCOUNTER — Ambulatory Visit: Admitting: Certified Nurse Midwife

## 2024-10-31 VITALS — BP 131/94 | HR 109 | Wt 275.0 lb

## 2024-10-31 DIAGNOSIS — Z0184 Encounter for antibody response examination: Secondary | ICD-10-CM | POA: Diagnosis not present

## 2024-10-31 DIAGNOSIS — Z3A12 12 weeks gestation of pregnancy: Secondary | ICD-10-CM

## 2024-10-31 DIAGNOSIS — Z113 Encounter for screening for infections with a predominantly sexual mode of transmission: Secondary | ICD-10-CM

## 2024-10-31 DIAGNOSIS — Z1379 Encounter for other screening for genetic and chromosomal anomalies: Secondary | ICD-10-CM

## 2024-10-31 DIAGNOSIS — Z130 Encounter for screening for diseases of the blood and blood-forming organs and certain disorders involving the immune mechanism: Secondary | ICD-10-CM

## 2024-10-31 DIAGNOSIS — O161 Unspecified maternal hypertension, first trimester: Secondary | ICD-10-CM

## 2024-10-31 DIAGNOSIS — T7589XA Other specified effects of external causes, initial encounter: Secondary | ICD-10-CM

## 2024-10-31 DIAGNOSIS — Z3481 Encounter for supervision of other normal pregnancy, first trimester: Secondary | ICD-10-CM | POA: Diagnosis not present

## 2024-10-31 DIAGNOSIS — Z0283 Encounter for blood-alcohol and blood-drug test: Secondary | ICD-10-CM

## 2024-10-31 DIAGNOSIS — O0990 Supervision of high risk pregnancy, unspecified, unspecified trimester: Secondary | ICD-10-CM

## 2024-10-31 DIAGNOSIS — Z3483 Encounter for supervision of other normal pregnancy, third trimester: Secondary | ICD-10-CM | POA: Diagnosis not present

## 2024-10-31 NOTE — Patient Instructions (Signed)
 Second Trimester of Pregnancy  The second trimester of pregnancy is from week 14 through week 27. This is months 4 through 6 of pregnancy. During the second trimester: Morning sickness is less or has stopped. You may have more energy. You may feel hungry more often. At this time, your unborn baby is growing very fast. At the end of the sixth month, the unborn baby may be up to 12 inches long and weigh about 1 pounds. You will likely start to feel the baby move between 16 and 20 weeks of pregnancy. Body changes during your second trimester Your body continues to change during this time. The changes usually go away after your baby is born. Physical changes You will gain more weight. Your belly will get bigger. You may begin to get stretch marks on your hips, belly, and breasts. Your breasts will keep growing and may hurt. You may get dark spots or blotches on your face. A dark line from your belly button to the pubic area may appear. This line is called linea nigra. Your hair may grow faster and get thicker. Health changes You may have headaches. You may have heartburn. You may pee more often. You may have swollen, bulging veins (varicose veins). You may have trouble pooping (constipation), or swollen veins in the butt that can itch or get painful (hemorrhoids). You may have back pain. This is caused by: Weight gain. Pregnancy hormones that are relaxing the joints in your pelvis. Follow these instructions at home: Medicines Talk to your health care provider if you're taking medicines. Ask if the medicines are safe to take during pregnancy. Your provider may change the medicines that you take. Do not take any medicines unless told to by your provider. Take a prenatal vitamin that has at least 600 micrograms (mcg) of folic acid. Do not use herbal medicines, illegal drugs, or medicines that are not approved by your provider. Eating and drinking While you're pregnant your body needs  extra food for your growing baby. Talk with your provider about what to eat while pregnant. Activity Most women are able to exercise during pregnancy. Exercises may need to change as your pregnancy goes on. Talk to your provider about your activities and exercise routines. Relieving pain and discomfort Wear a good, supportive bra if your breasts hurt. Rest with your legs raised if you have leg cramps or low back pain. Take warm sitz baths to soothe pain from hemorrhoids. Use hemorrhoid cream if your provider says it's okay. Do not douche. Do not use tampons or scented pads. Do not use hot tubs, steam rooms, or saunas. Safety Wear your seatbelt at all times when you're in a car. Talk to your provider if someone hits you, hurts you, or yells at you. Talk with your provider if you're feeling sad or have thoughts of hurting yourself. Lifestyle Certain things can be harmful while you're pregnant. It's best to avoid the following: Do not drink alcohol,smoke, vape, or use products with nicotine or tobacco in them. If you need help quitting, talk with your provider. Avoid cat litter boxes and soil used by cats. These things carry germs that can cause harm to your pregnancy and your baby. General instructions Keep all follow-up visits. It helps you and your unborn baby stay as healthy as possible. Write down your questions. Take them to your prenatal visits. Your provider will: Talk with you about your overall health. Give you advice or refer you to specialists who can help with different needs,  including: Prenatal education classes. Mental health and counseling. Foods and healthy eating. Ask for help if you need help with food. Where to find more information American Pregnancy Association: americanpregnancy.org [employer]: acog.org Office on [employer] Health: [employer] Contact a health care provider if: You have a headache that does not go away  when you take medicine. You have any of these problems: You can't eat or drink. You throw up or feel like you may throw up. You have watery poop (diarrhea) for 2 days or more. You have pain when you pee or your pee smells bad. You have been sick for 2 days or more and are not getting better. Contact your provider right away if: You have any of these coming from your vagina: Abnormal discharge. Bad-smelling fluid. Bleeding. Your baby is moving less than usual. You have contractions, belly cramping, or have pain in your pelvis or lower back. You have symptoms of high blood pressure or preeclampsia. These include: A severe, throbbing headache that does not go away. Sudden or extreme swelling of your face, hands, legs, or feet. Vision problems: You see spots. You have blurry vision. Your eyes are sensitive to light. If you can't reach the provider, go to an urgent care or emergency room. Get help right away if: You faint, become confused, or can't think clearly. You have chest pain or trouble breathing. You have any kind of injury, such as from a fall or a car crash. These symptoms may be an emergency. Call 911 right away. Do not wait to see if the symptoms will go away. Do not drive yourself to the hospital. This information is not intended to replace advice given to you by your health care provider. Make sure you discuss any questions you have with your health care provider. Document Revised: 08/10/2023 Document Reviewed: 03/10/2023 Elsevier Patient Education  2024 [employer].

## 2024-10-31 NOTE — Progress Notes (Unsigned)
 NEW OB HISTORY AND PHYSICAL  SUBJECTIVE:       Jasmine Gordon is a 38 y.o. [id number] female, Patient's last menstrual period was 08/08/2024 (exact date)., Estimated Date of Delivery: 05/15/25, [redacted]w[redacted]d, presents today for establishment of Prenatal Care. She reports feeling well, has had spotting now resolved. Separated & in process of divorce. Feeling well supported by Charmaine.  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 [employer] Exercise: walking Substance use: denies T/E/D  Indications for ASA therapy (per uptodate) One of the following: Previous pregnancy with preeclampsia, especially early onset and with an adverse outcome No Multifetal gestation No Chronic hypertension No Type 1 or 2 diabetes mellitus No (pre-diabetic)  Chronic kidney disease No Autoimmune disease (antiphospholipid syndrome, systemic lupus erythematosus) No  Two or more of the following: Nulliparity No Obesity (body mass index >30 kg/m2) Yes Family history of preeclampsia in mother or sister No Age >=35 years Yes Sociodemographic characteristics (African American race, low socioeconomic level) Yes Personal risk factors (eg, previous pregnancy with low birth weight or small for gestational age infant, previous adverse pregnancy outcome [eg, stillbirth], interval >10 years between pregnancies) No  Gynecologic History Patient's last menstrual period was 08/08/2024 (exact date). Normal Contraception: none Last Pap: 02/27/2024    Component Value Date/Time   DIAGPAP  02/27/2024 0911    - Negative for intraepithelial lesion or malignancy (NILM)   DIAGPAP  02/23/2023 1505    - Negative for Intraepithelial Lesions or Malignancy (NILM)   DIAGPAP - Benign reactive/reparative changes 02/23/2023 1505   HPVHIGH Negative 02/27/2024 0911   HPVHIGH Negative 02/23/2023 1505   HPVHIGH Negative  12/17/2021 0853   ADEQPAP  02/27/2024 0911    Satisfactory for evaluation; transformation zone component PRESENT.   ADEQPAP  02/23/2023 1505    Satisfactory for evaluation; transformation zone component PRESENT.   ADEQPAP  12/17/2021 0853    Satisfactory for evaluation; transformation zone component ABSENT.  SABRA Results were: normal  Obstetric History OB History  Gravida Para Term Preterm AB Living  4 3 3   3   SAB IAB Ectopic Multiple Live Births      3    # Outcome Date GA Lbr Len/2nd Weight Sex Type Anes PTL Lv  4 Current           3 Term 09/02/13 [redacted]w[redacted]d   M Vag-Spont   LIV     Birth Comments: cord around neck at delivery  2 Term 06/22/10 [redacted]w[redacted]d   M Vag-Spont   LIV  1 Term 07/11/08 [redacted]w[redacted]d   M Vag-Spont   LIV    Past Medical History:  Diagnosis Date   Allergy    Anemia    Anxiety    Asthma    Blood transfusion without reported diagnosis 07/11/2008   Loss blood during delivery   Depression    GERD (gastroesophageal reflux disease)    HPV (human papilloma virus) infection    Hyperlipidemia    Migraine    Obese    OSA (obstructive sleep apnea)    Panic attack    Prediabetes    PTSD (post-traumatic stress disorder) 2016   Sleep apnea 05/2023   TB lung, latent     Past Surgical History:  Procedure Laterality Date   APPENDECTOMY  09/2007   LEEP  12/2020   CIN II, negative margins   Plantar warts     WISDOM TOOTH EXTRACTION  Medications Ordered Prior to Encounter[1]  Allergies[2]  Social History   Socioeconomic History   Marital status: Married    Spouse name: Not on file   Number of children: Not on file   Years of education: Not on file   Highest education level: Some college, no degree  Occupational History   Not on file  Tobacco Use   Smoking status: Never    Passive exposure: Never   Smokeless tobacco: Never  Vaping Use   Vaping status: Never Used  Substance and Sexual Activity   Alcohol use: Not Currently   Drug use: No   Sexual activity:  Yes    Birth control/protection: None  Other Topics Concern   Not on file  Social History Narrative   Not on file   Social Drivers of Health   Tobacco Use: Low Risk (10/15/2024)   Patient History    Smoking Tobacco Use: Never    Smokeless Tobacco Use: Never    Passive Exposure: Never  Financial Resource Strain: Medium Risk (05/28/2024)   Overall Financial Resource Strain (CARDIA)    Difficulty of Paying Living Expenses: Somewhat hard  Food Insecurity: No Food Insecurity (09/27/2024)   Epic    Worried About Programme Researcher, Broadcasting/film/video in the Last Year: Never true    Ran Out of Food in the Last Year: Never true  Recent Concern: Food Insecurity - Food Insecurity Present (09/10/2024)   Epic    Worried About Programme Researcher, Broadcasting/film/video in the Last Year: Sometimes true    Ran Out of Food in the Last Year: Sometimes true  Transportation Needs: No Transportation Needs (09/27/2024)   Epic    Lack of Transportation (Medical): No    Lack of Transportation (Non-Medical): No  Physical Activity: Sufficiently Active (03/14/2024)   Exercise Vital Sign    Days of Exercise per Week: 5 days    Minutes of Exercise per Session: 30 min  Recent Concern: Physical Activity - Insufficiently Active (01/08/2024)   Exercise Vital Sign    Days of Exercise per Week: 2 days    Minutes of Exercise per Session: 20 min  Stress: No Stress Concern Present (08/12/2024)   [employer] of Occupational Health - Occupational Stress Questionnaire    Feeling of Stress: Only a little  Recent Concern: Stress - Stress Concern Present (06/18/2024)   [employer] of Occupational Health - Occupational Stress Questionnaire    Feeling of Stress: To some extent  Social Connections: Moderately Integrated (03/14/2024)   Social Connection and Isolation Panel    Frequency of Communication with Friends and Family: More than three times a week    Frequency of Social Gatherings with Friends and Family: More than three times a week    Attends  Religious Services: More than 4 times per year    Active Member of Clubs or Organizations: Yes    Attends Banker Meetings: More than 4 times per year    Marital Status: Separated  Intimate Partner Violence: Not At Risk (09/27/2024)   Epic    Fear of Current or Ex-Partner: No    Emotionally Abused: No    Physically Abused: No    Sexually Abused: No  Depression (PHQ2-9): Low Risk (09/10/2024)   Depression (PHQ2-9)    PHQ-2 Score: 0  Recent Concern: Depression (PHQ2-9) - Medium Risk (07/03/2024)   Depression (PHQ2-9)    PHQ-2 Score: 7  Alcohol Screen: Low Risk (05/01/2024)   Alcohol Screen    Last Alcohol Screening Score (  AUDIT): 0  Housing: Low Risk (09/27/2024)   Epic    Unable to Pay for Housing in the Last Year: No    Number of Times Moved in the Last Year: 1    Homeless in the Last Year: No  Recent Concern: Housing - High Risk (09/10/2024)   Epic    Unable to Pay for Housing in the Last Year: Yes    Number of Times Moved in the Last Year: Not on file    Homeless in the Last Year: No  Utilities: Not At Risk (09/27/2024)   Epic    Threatened with loss of utilities: No  Health Literacy: Adequate Health Literacy (03/14/2024)   B1300 Health Literacy    Frequency of need for help with medical instructions: Rarely    Family History  Problem Relation Age of Onset   Alcohol abuse Mother    Drug abuse Mother    Diabetes Mother    Depression Mother    Mental retardation Mother    Bipolar disorder Mother    Heart disease Father    Diabetes Father    Heart failure Sister    Stroke Maternal Grandfather    Diabetes Maternal Grandfather     The following portions of the patient's history were reviewed and updated as appropriate: allergies, current medications, past OB history, past medical history, past surgical history, past family history, past social history, and problem list.  Constitutional: Denied constitutional symptoms, night sweats, recent illness, fatigue,  fever, insomnia and weight loss.  Eyes: Denied eye symptoms, eye pain, photophobia, vision change and visual disturbance.  Ears/Nose/Throat/Neck: Denied ear, nose, throat or neck symptoms, hearing loss, nasal discharge, sinus congestion and sore throat.  Cardiovascular: Denied cardiovascular symptoms, arrhythmia, chest pain/pressure, edema, exercise intolerance, orthopnea and palpitations.  Respiratory: Denied pulmonary symptoms, asthma, pleuritic pain, productive sputum, cough, dyspnea and wheezing.  Gastrointestinal: Denied gastro-esophageal reflux, melena, nausea and vomiting.  Genitourinary: Denied genitourinary symptoms including symptomatic vaginal discharge, pelvic relaxation issues, and urinary complaints.  Musculoskeletal: Denied musculoskeletal symptoms, stiffness, swelling, muscle weakness and myalgia.  Dermatologic: Denied dermatology symptoms, rash and scar.  Neurologic: Denied neurology symptoms, dizziness, headache, neck pain and syncope.  Psychiatric: Denied psychiatric symptoms, anxiety and depression.  Endocrine: Denied endocrine symptoms including hot flashes and night sweats.     OBJECTIVE: Initial Physical Exam (New OB) BP (!) 131/94   Pulse (!) 109   Wt 275 lb (124.7 kg)   LMP 08/08/2024 (Exact Date)   BMI 44.39 kg/m  BP (!) 131/94   Pulse (!) 109   Wt 275 lb (124.7 kg)   LMP 08/08/2024 (Exact Date)   BMI 44.39 kg/m   Physical Exam Vitals reviewed.  Constitutional:      Appearance: Normal appearance. She is obese.  HENT:     Head: Normocephalic.  Cardiovascular:     Rate and Rhythm: Normal rate and regular rhythm.  Pulmonary:     Effort: Pulmonary effort is normal.     Breath sounds: Normal breath sounds.  Abdominal:     Palpations: Abdomen is soft.     Tenderness: There is no abdominal tenderness.  Neurological:     General: No focal deficit present.     Mental Status: She is alert and oriented to person, place, and time.  Psychiatric:         Mood and Affect: Mood normal.        Behavior: Behavior normal.     Fetal Heart Rate (bpm):  (+cardiac activity &  fetal movement on BSUS)  ASSESSMENT: Normal pregnancy   PLAN: Routine prenatal care. We discussed an overview of prenatal care and when to call. Reviewed diet, exercise, and weight gain recommendations in pregnancy. Discussed benefits of breastfeeding and lactation resources at [facility]. I reviewed labs and answered all questions. Start daily ASA MFM for anatomy-AMA, obesity, labile BP-discussed with patient may be cHTN. Reviewed growth u/s q4w in 3rd trimester, NSTs & delivery by EDD. Baseline HELLP labs were collected at pregnancy confirmation visit.  1. [redacted] weeks gestation of pregnancy (Primary) - NOB Panel - Culture, OB Urine - Monitor Drug Profile 14(MW) - Nicotine screen, urine - Urinalysis, Routine w reflex microscopic - PANORAMA PRENATAL TEST - Cervicovaginal ancillary only  2. Encounter for supervision of other normal pregnancy in first trimester - NOB Panel - Culture, OB Urine - Monitor Drug Profile 14(MW) - Nicotine screen, urine - Urinalysis, Routine w reflex microscopic - PANORAMA PRENATAL TEST  3. Encounter for drug screening - Monitor Drug Profile 14(MW) - Nicotine screen, urine  4. Exposure to cat feces, initial encounter - NOB Panel  5. Genetic screening - PANORAMA PRENATAL TEST  6. Immunity status testing - NOB Panel  7. Screening examination for STD (sexually transmitted disease) - NOB Panel - Cervicovaginal ancillary only  8. Screening for iron  deficiency anemia - NOB Panel  9. Elevated blood pressure affecting pregnancy in first trimester, antepartum - HgB A1c   Harlene LITTIE Cisco, CNM       [1]  Current Outpatient Medications on File Prior to Visit  Medication Sig Dispense Refill   albuterol  (PROVENTIL ) (2.5 MG/3ML) 0.083% nebulizer solution Take 3 mLs (2.5 mg total) by nebulization every 4 (four) hours as needed for  wheezing or shortness of breath. 75 mL 2   albuterol  (VENTOLIN  HFA) 108 (90 Base) MCG/ACT inhaler Inhale 2 puffs into the lungs every 6 (six) hours as needed for wheezing or shortness of breath. 18 g 5   ASHWAGANDHA GUMMIES PO Take by mouth. (Patient not taking: Reported on 09/27/2024)     Blood Pressure Monitoring (BLOOD PRESSURE CUFF) MISC Use to monitor home blood pressure (Patient not taking: Reported on 09/27/2024) 1 each 0   cetirizine  (ZYRTEC ) 10 MG tablet Take 10 mg by mouth daily as needed for allergies.     cholecalciferol (VITAMIN D3) 25 MCG (1000 UNIT) tablet Take 1,000 Units by mouth daily. (Patient not taking: Reported on 09/27/2024)     fluticasone  (FLONASE ) 50 MCG/ACT nasal spray Place 1 spray into both nostrils daily. Use for 4-6 weeks then stop and use seasonally or as needed. 16 g 0   hydrOXYzine  (ATARAX ) 10 MG tablet Take 1 tablet (10 mg total) by mouth 3 (three) times daily as needed for anxiety. (Patient not taking: Reported on 09/27/2024) 60 tablet 3   ketoconazole  (NIZORAL ) 2 % cream Apply 1 Application topically daily. (Patient not taking: Reported on 09/27/2024) 30 g 0   montelukast  (SINGULAIR ) 10 MG tablet Take 10 mg by mouth at bedtime as needed. (Patient not taking: Reported on 09/27/2024)     ondansetron  (ZOFRAN -ODT) 4 MG disintegrating tablet Take 1 tablet (4 mg total) by mouth every 8 (eight) hours as needed. 20 tablet 0   Prenatal Vit-Fe Phos-FA-Omega (VITAFOL  GUMMIES) 3.33-0.333-34.8 MG CHEW Chew 3 tablets by mouth daily. 90 tablet 5   Spacer/Aero-Holding Chambers (AEROCHAMBER MV) inhaler Use as instructed 1 each 2   No current facility-administered medications on file prior to visit.  [2]  Allergies Allergen Reactions  Reglan [Metoclopramide] Hives and Shortness Of Breath   Contrave  [Naltrexone -Bupropion  Hcl Er] Other (See Comments)    Had some reaction to the medication

## 2024-11-01 ENCOUNTER — Ambulatory Visit: Payer: Self-pay | Admitting: Certified Nurse Midwife

## 2024-11-01 DIAGNOSIS — O99210 Obesity complicating pregnancy, unspecified trimester: Secondary | ICD-10-CM | POA: Insufficient documentation

## 2024-11-01 DIAGNOSIS — R7303 Prediabetes: Secondary | ICD-10-CM

## 2024-11-01 DIAGNOSIS — O0990 Supervision of high risk pregnancy, unspecified, unspecified trimester: Secondary | ICD-10-CM

## 2024-11-01 LAB — CBC/D/PLT+RPR+RH+ABO+RUBIGG...
Antibody Screen: NEGATIVE
Basophils Absolute: 0 x10E3/uL (ref 0.0–0.2)
Basos: 0 %
EOS (ABSOLUTE): 0.1 x10E3/uL (ref 0.0–0.4)
Eos: 1 %
HCV Ab: NONREACTIVE
HIV Screen 4th Generation wRfx: NONREACTIVE
Hematocrit: 35.9 % (ref 34.0–46.6)
Hemoglobin: 11.5 g/dL (ref 11.1–15.9)
Hepatitis B Surface Ag: NEGATIVE
Immature Grans (Abs): 0 x10E3/uL (ref 0.0–0.1)
Immature Granulocytes: 0 %
Lymphocytes Absolute: 1.7 x10E3/uL (ref 0.7–3.1)
Lymphs: 24 %
MCH: 26.7 pg (ref 26.6–33.0)
MCHC: 32 g/dL (ref 31.5–35.7)
MCV: 83 fL (ref 79–97)
Monocytes Absolute: 0.4 x10E3/uL (ref 0.1–0.9)
Monocytes: 6 %
Neutrophils Absolute: 4.8 x10E3/uL (ref 1.4–7.0)
Neutrophils: 69 %
Platelets: 237 x10E3/uL (ref 150–450)
RBC: 4.31 x10E6/uL (ref 3.77–5.28)
RDW: 13.5 % (ref 11.7–15.4)
RPR Ser Ql: NONREACTIVE
Rh Factor: POSITIVE
Rubella Antibodies, IGG: 1.79 {index} (ref >0.99)
Varicella zoster IgG: REACTIVE
WBC: 7 x10E3/uL (ref 3.4–10.8)

## 2024-11-01 LAB — URINALYSIS, ROUTINE W REFLEX MICROSCOPIC
Bilirubin, UA: NEGATIVE
Glucose, UA: NEGATIVE
Ketones, UA: NEGATIVE
Leukocytes,UA: NEGATIVE
Nitrite, UA: NEGATIVE
Protein,UA: NEGATIVE
RBC, UA: NEGATIVE
Specific Gravity, UA: 1.023 (ref 1.005–1.030)
Urobilinogen, Ur: 0.2 mg/dL (ref 0.2–1.0)
pH, UA: 6 (ref 5.0–7.5)

## 2024-11-01 LAB — HCV INTERPRETATION

## 2024-11-01 LAB — HEMOGLOBIN A1C
Est. average glucose Bld gHb Est-mCnc: 126 mg/dL
Hgb A1c MFr Bld: 6 % — ABNORMAL HIGH (ref 4.8–5.6)

## 2024-11-01 MED ORDER — ASPIRIN 81 MG PO TBEC: 81.0000 mg | DELAYED_RELEASE_TABLET | Freq: Every day | ORAL | 2 refills | Status: AC

## 2024-11-02 LAB — URINE CULTURE, OB REFLEX

## 2024-11-02 LAB — CULTURE, OB URINE

## 2024-11-03 LAB — TSH RFX ON ABNORMAL TO FREE T4: TSH: 2.09 u[IU]/mL (ref 0.450–4.500)

## 2024-11-03 LAB — SPECIMEN STATUS REPORT

## 2024-11-04 LAB — CERVICOVAGINAL ANCILLARY ONLY
Chlamydia: NEGATIVE
Comment: NEGATIVE
Comment: NORMAL
Neisseria Gonorrhea: NEGATIVE

## 2024-11-07 ENCOUNTER — Encounter: Payer: Self-pay | Admitting: Family Medicine

## 2024-11-07 DIAGNOSIS — J4530 Mild persistent asthma, uncomplicated: Secondary | ICD-10-CM

## 2024-11-08 ENCOUNTER — Other Ambulatory Visit: Payer: Self-pay

## 2024-11-08 DIAGNOSIS — O0990 Supervision of high risk pregnancy, unspecified, unspecified trimester: Secondary | ICD-10-CM

## 2024-11-08 LAB — MONITOR DRUG PROFILE 14(MW)

## 2024-11-08 LAB — NICOTINE SCREEN, URINE

## 2024-11-08 MED ORDER — VITAFOL GUMMIES 3.33-0.333-34.8 MG PO CHEW: 3.0000 | CHEWABLE_TABLET | Freq: Every day | ORAL | 5 refills | Status: DC

## 2024-11-11 ENCOUNTER — Telehealth: Payer: Self-pay | Admitting: Licensed Clinical Social Worker

## 2024-11-11 NOTE — Telephone Encounter (Signed)
-----   Message from Andriette DELENA Lambing sent at 11/08/2024  3:25 PM EST ----- Regarding: Mental Health Referral Hello,  This member is requesting mental health services. Please contact her when you can.   Happy Adonna, Jenna

## 2024-11-11 NOTE — Telephone Encounter (Signed)
 SABRA

## 2024-11-12 DIAGNOSIS — H5203 Hypermetropia, bilateral: Secondary | ICD-10-CM | POA: Diagnosis not present

## 2024-11-12 LAB — PANORAMA PRENATAL TEST FULL PANEL:PANORAMA TEST PLUS 5 ADDITIONAL MICRODELETIONS: FETAL FRACTION: 4.6

## 2024-11-15 ENCOUNTER — Ambulatory Visit: Admitting: Certified Nurse Midwife

## 2024-11-15 ENCOUNTER — Encounter: Payer: Self-pay | Admitting: Certified Nurse Midwife

## 2024-11-15 VITALS — BP 131/86 | HR 118 | Wt 273.8 lb

## 2024-11-15 DIAGNOSIS — Z3482 Encounter for supervision of other normal pregnancy, second trimester: Secondary | ICD-10-CM | POA: Diagnosis not present

## 2024-11-15 DIAGNOSIS — Z3A14 14 weeks gestation of pregnancy: Secondary | ICD-10-CM

## 2024-11-15 MED ORDER — BONJESTA 20-20 MG PO TBCR: 1.0000 | EXTENDED_RELEASE_TABLET | Freq: Two times a day (BID) | ORAL | 4 refills | Status: DC

## 2024-11-15 NOTE — Patient Instructions (Signed)
 Round Ligament Pain  The round ligaments are a pair of cord-like tissues that help support the uterus. They can become a source of pain during pregnancy as the ligaments soften and stretch as the baby grows. The pain usually begins in the second trimester (13-28 weeks) of pregnancy, and should only last for a few seconds when it occurs. However, the pain can come and go until the baby is delivered. The pain does not cause harm to the baby. Round ligament pain is usually a short, sharp, and pinching pain, but it can also be a dull, lingering, and aching pain. The pain is felt in the lower side of the abdomen or in the groin. It usually starts deep in the groin and moves up to the outside of the hip area. The pain may happen when you: Suddenly change position, such as quickly going from a sitting to standing position. Do physical activity. Cough or sneeze. Follow these instructions at home: Managing pain  When the pain starts, relax. Then, try any of these methods to help with the pain: Sit down. Flex your knees up to your abdomen. Lie on your side with one pillow under your abdomen and another pillow between your legs. Sit in a warm bath for 15-20 minutes or until the pain goes away. General instructions Watch your condition for any changes. Move slowly when you sit down or stand up. Stop or reduce your physical activities if they cause pain. Avoid long walks if they cause pain. Take over-the-counter and prescription medicines only as told by your health care provider. Keep all follow-up visits. This is important. Contact a health care provider if: Your pain does not go away with treatment. You feel pain in your back that you did not have before. Your medicine is not helping. You have a fever or chills. You have nausea or vomiting. You have diarrhea. You have pain when you urinate. Get help right away if: You have pain that is a rhythmic, cramping pain similar to labor pains. Labor  pains are usually 2 minutes apart, last for about 1 minute, and involve a bearing down feeling or pressure in your pelvis. You have vaginal bleeding. These symptoms may represent a serious problem that is an emergency. Do not wait to see if the symptoms will go away. Get medical help right away. Call your local emergency services (911 in the [place] Do not drive yourself to the hospital. Summary Round ligament pain is felt in the lower abdomen or groin. This pain usually begins in the second trimester (13-28 weeks) and should only last for a few seconds when it occurs. You may notice the pain when you suddenly change position, when you cough or sneeze, or during physical activity. Relaxing, flexing your knees to your abdomen, lying on one side, or taking a warm bath may help to get rid of the pain. Contact your health care provider if the pain does not go away. This information is not intended to replace advice given to you by your health care provider. Make sure you discuss any questions you have with your health care provider. Document Revised: 01/20/2021 Document Reviewed: 01/20/2021 Elsevier Patient Education  2024 [employer].

## 2024-11-15 NOTE — Progress Notes (Signed)
 Pt presents for FHT. They were unable to occultate at NOB due to maternal body habitus. However BSU showed fetal movement. Pt very stressed today. States she has not been sleeping due to worry about not hearing FHT. Discussed use of benadryl  or Unisom to help with sleep prn. Reassurance given. FHT heard today by patient and her partner. Pt c/o ptyalism states she has had with her other pregnancies as well. She also notes nausea, states the Zofran  gives her a headache all day. Orders placed for Bonjesta . Pt encouraged to start her aspirin  as ordered. Reassurance given. BP elevated today, repeat after FHT osculated 131/86.  Pt to follow as scheduled at 16 weeks for ROB.   Zelda Hummer, CNM

## 2024-11-18 ENCOUNTER — Encounter: Payer: Self-pay | Admitting: Certified Nurse Midwife

## 2024-11-19 ENCOUNTER — Other Ambulatory Visit: Payer: Self-pay

## 2024-11-19 ENCOUNTER — Encounter: Admitting: Certified Nurse Midwife

## 2024-11-19 MED ORDER — DOXYLAMINE-PYRIDOXINE 10-10 MG PO TBEC: 1.0000 | DELAYED_RELEASE_TABLET | Freq: Every day | ORAL | 1 refills | Status: AC

## 2024-11-25 ENCOUNTER — Other Ambulatory Visit: Payer: Self-pay

## 2024-11-25 ENCOUNTER — Emergency Department
Admission: EM | Admit: 2024-11-25 | Discharge: 2024-11-25 | Disposition: A | Discharge status: Home / Self Care | Attending: Emergency Medicine | Admitting: Emergency Medicine

## 2024-11-25 ENCOUNTER — Emergency Department

## 2024-11-25 DIAGNOSIS — R0602 Shortness of breath: Secondary | ICD-10-CM | POA: Insufficient documentation

## 2024-11-25 DIAGNOSIS — Z3A16 16 weeks gestation of pregnancy: Secondary | ICD-10-CM | POA: Diagnosis not present

## 2024-11-25 DIAGNOSIS — O26892 Other specified pregnancy related conditions, second trimester: Secondary | ICD-10-CM | POA: Insufficient documentation

## 2024-11-25 LAB — CBC
HCT: 32.5 % — ABNORMAL LOW (ref 36.0–46.0)
Hemoglobin: 10.6 g/dL — ABNORMAL LOW (ref 12.0–15.0)
MCH: 27 pg (ref 26.0–34.0)
MCHC: 32.6 g/dL (ref 30.0–36.0)
MCV: 82.9 fL (ref 80.0–100.0)
Platelets: 208 K/uL (ref 150–400)
RBC: 3.92 MIL/uL (ref 3.87–5.11)
RDW: 13.4 % (ref 11.5–15.5)
WBC: 7.1 K/uL (ref 4.0–10.5)
nRBC: 0 % (ref 0.0–0.2)

## 2024-11-25 LAB — BASIC METABOLIC PANEL WITH GFR
Anion gap: 11 (ref 5–15)
BUN: 6 mg/dL (ref 6–20)
CO2: 22 mmol/L (ref 22–32)
Calcium: 9.4 mg/dL (ref 8.9–10.3)
Chloride: 103 mmol/L (ref 98–111)
Creatinine, Ser: 0.64 mg/dL (ref 0.44–1.00)
GFR, Estimated: >60 mL/min
Glucose, Bld: 106 mg/dL — ABNORMAL HIGH (ref 70–99)
Potassium: 3.5 mmol/L (ref 3.5–5.1)
Sodium: 136 mmol/L (ref 135–145)

## 2024-11-25 LAB — TROPONIN T, HIGH SENSITIVITY: Troponin T High Sensitivity: <15 ng/L (ref 0–19)

## 2024-11-25 NOTE — ED Triage Notes (Addendum)
 Pt to ED via POV from home. Pt reports body aches and heartburn and concerned K+ is low. Pt denies CP/heart burn on arrival. Pt reports increased SOB. Pt is G4P3. Pt is seen at [facility]

## 2024-11-25 NOTE — ED Provider Notes (Signed)
 "  [facility] Provider Note    Event Date/Time   First MD Initiated Contact with Patient 11/25/24 2145     (approximate)   History   Chief Complaint Shortness of Breath (15wks preg)   HPI  Jasmine Gordon is a 39 y.o. female G4P3 at approximately 15 weeks of pregnancy who presents to the ED complaining of shortness of breath.  Patient reports that she was feeling short of breath earlier this morning while running multiple errands.  She denies any fevers, cough, or pain in her chest and now states that her breathing seems to be improved.  She states I may have been having a panic attack and reports that she is now feeling much better.  She does report some chest discomfort which she describes as heartburn intermittently over the past 3 days that has also since resolved.  She has not had any pain or swelling in her legs, denies any complications with her pregnancy thus far.  She does report multiple members of her family have been sick with flulike illness recently.     Physical Exam   Triage Vital Signs: ED Triage Vitals [11/25/24 1846]  Encounter Vitals Group     BP 128/85     Girls Systolic BP Percentile      Girls Diastolic BP Percentile      Boys Systolic BP Percentile      Boys Diastolic BP Percentile      Pulse Rate (!) 115     Resp 20     Temp 98.8 F (37.1 C)     Temp Source Oral     SpO2 98 %     Weight      Height      Head Circumference      Peak Flow      Pain Score 4     Pain Loc      Pain Education      Exclude from Growth Chart     Most recent vital signs: Vitals:   11/25/24 1846  BP: 128/85  Pulse: (!) 115  Resp: 20  Temp: 98.8 F (37.1 C)  SpO2: 98%    Constitutional: Alert and oriented. Eyes: Conjunctivae are normal. Head: Atraumatic. Nose: No congestion/rhinnorhea. Mouth/Throat: Mucous membranes are moist.  Cardiovascular: Normal rate, regular rhythm. Grossly normal heart sounds.  2+ radial pulses  bilaterally. Respiratory: Normal respiratory effort.  No retractions. Lungs CTAB. Gastrointestinal: Soft and nontender. No distention. Musculoskeletal: No lower extremity tenderness nor edema.  Neurologic:  Normal speech and language. No gross focal neurologic deficits are appreciated.    ED Results / Procedures / Treatments   Labs (all labs ordered are listed, but only abnormal results are displayed) Labs Reviewed  BASIC METABOLIC PANEL WITH GFR - Abnormal; Notable for the following components:      Result Value   Glucose, Bld 106 (*)    All other components within normal limits  CBC - Abnormal; Notable for the following components:   Hemoglobin 10.6 (*)    HCT 32.5 (*)    All other components within normal limits  TROPONIN T, HIGH SENSITIVITY     EKG  ED ECG REPORT I, Carlin Palin, the attending physician, personally viewed and interpreted this ECG.   Date: 11/25/2024  EKG Time: 18:50  Rate: 106  Rhythm: sinus tachycardia  Axis: Normal  Intervals:none  ST&T Change: None  RADIOLOGY OB ultrasound reviewed and interpreted by me with intrauterine pregnancy and appropriate fetal heart  rate.  PROCEDURES:  Critical Care performed: No  Procedures   MEDICATIONS ORDERED IN ED: Medications - No data to display   IMPRESSION / MDM / ASSESSMENT AND PLAN / ED COURSE  I reviewed the triage vital signs and the nursing notes.                              39 y.o. female G4P3 at approximately 15 weeks pregnancy with past medical history of hyperlipidemia, IDA, and asthma who presents to the ED complaining of shortness of breath earlier today that has since improved with chest discomfort over the past couple of days that has resolved.  Patient's presentation is most consistent with acute presentation with potential threat to life or bodily function.  Differential diagnosis includes, but is not limited to, ACS, PE, pneumonia, pneumothorax, musculoskeletal pain, GERD,  anxiety, viral illness.  Patient well-appearing and in no acute distress, vital signs are remarkable for tachycardia but otherwise reassuring.  She denies any complaints related to her pregnancy and obstetric ultrasound is unremarkable.  EKG shows no evidence of arrhythmia or ischemia and symptoms seem atypical for ACS, troponin within normal limits.  I also have low suspicion for PE given her resolving symptoms and no symptoms to suggest DVT.  Will consider viral illness as well as anxiety, low suspicion for pneumonia and we will hold off on chest x-ray given her pregnancy.  Heart rate improved to 97 on recheck and patient is eager to be discharged home.  She was counseled to follow-up with her OB/GYN and to return to the ED for new or worsening symptoms, patient agrees with plan.      FINAL CLINICAL IMPRESSION(S) / ED DIAGNOSES   Final diagnoses:  Shortness of breath     Rx / DC Orders   ED Discharge Orders     None        Note:  This document was prepared using Dragon voice recognition software and may include unintentional dictation errors.   Willo Dunnings, MD 11/25/24 2324  "

## 2024-11-29 NOTE — Progress Notes (Unsigned)
" ° ° °  Return Prenatal Note   Subjective   39 y.o. [id number] at [redacted]w[redacted]d presents for this follow-up prenatal visit.  Patient  Patient reports: Is not feeling movement yet -went to the ED for  SOB, was told she most likely has a virus, her son had flu B recently. Dorthis never developed any cold/flu symptoms, does till feel SOB at times.  -has not felt movement yet and they could not auscultate the heartbeat in the ED, but found it with US , this makes her nervous  Movement: Absent Contractions: Not present  Objective   Flow sheet Vitals: Pulse Rate: (!) 113 BP: (!) 128/52 Fetal Heart Rate (bpm): 140 Total weight gain: 6 lb 4.8 oz (2.858 kg)  General Appearance  No acute distress, well appearing, and well nourished Pulmonary   Normal work of breathing Neurologic   Alert and oriented to person, place, and time Psychiatric   Mood and affect within normal limits   Assessment/Plan   Plan  39 y.o. [id number] at [redacted]w[redacted]d presents for follow-up OB visit. Reviewed prenatal record including previous visit note.  Elevated blood pressure affecting pregnancy in first trimester, antepartum -checks BP at home, 110's-120's/80's  -normotensive today  -started a new job,  will be weekens at [facility], she feels this will help her BP stabilize   Obesity affecting pregnancy -TWG 6lbs, WNL -will pick up ASA today    Anxiety and depression -Mood has bee good, denies concerns today,   Supervision of high risk pregnancy, antepartum -heartbeat easily heard today, what to expect in regards to movement discussed  -has nutrition appointment at the end of the month -Anatomy US  with MFM scheduled        Orders Placed This Encounter  Procedures   AFP, Serum, Open Spina Bifida    Is patient insulin dependent?:   No    Patient weight (lb.):   274.3 lb    Gestational Age (GA), weeks:   16.4    Date on which patient was at this GA:   12/02/2024    GA Calculation Method:   LMP    GA Date:   05/15/2025     Number of fetuses:   1    Reason for screen:   OTHER             screen    Donor egg?:   N   Return in about 4 weeks (around 12/30/2024) for ROB.   Future Appointments  Date Time Provider Department Center  12/16/2024 11:15 AM Lovely Pock A, RD [facility] None  12/17/2024  8:00 AM [facility] None  12/18/2024  2:30 PM Babara Call, MD [facility] None  12/18/2024  3:00 PM [facility] INFUSION CHAIR 17 [facility] None  12/30/2024  9:45 AM [facility] PROVIDER 1 [facility] [place]  12/30/2024 10:00 AM [facility] US  1 [facility] [place]    For next visit:  continue with routine prenatal care     JINNIE HERO [facility], CNM  1/12/20262:53 PM  "

## 2024-12-02 ENCOUNTER — Ambulatory Visit: Admitting: Licensed Practical Nurse

## 2024-12-02 ENCOUNTER — Encounter: Payer: Self-pay | Admitting: Licensed Practical Nurse

## 2024-12-02 ENCOUNTER — Other Ambulatory Visit

## 2024-12-02 VITALS — BP 128/52 | HR 113 | Wt 274.3 lb

## 2024-12-02 DIAGNOSIS — O161 Unspecified maternal hypertension, first trimester: Secondary | ICD-10-CM

## 2024-12-02 DIAGNOSIS — O0990 Supervision of high risk pregnancy, unspecified, unspecified trimester: Secondary | ICD-10-CM

## 2024-12-02 DIAGNOSIS — Z3482 Encounter for supervision of other normal pregnancy, second trimester: Secondary | ICD-10-CM

## 2024-12-02 DIAGNOSIS — F32A Depression, unspecified: Secondary | ICD-10-CM

## 2024-12-02 DIAGNOSIS — F419 Anxiety disorder, unspecified: Secondary | ICD-10-CM

## 2024-12-02 DIAGNOSIS — R7303 Prediabetes: Secondary | ICD-10-CM

## 2024-12-02 DIAGNOSIS — Z3A16 16 weeks gestation of pregnancy: Secondary | ICD-10-CM | POA: Diagnosis not present

## 2024-12-02 MED ORDER — VITAFOL GUMMIES 3.33-0.333-34.8 MG PO CHEW: 3.0000 | CHEWABLE_TABLET | Freq: Every day | ORAL | 5 refills | Status: DC

## 2024-12-02 NOTE — Assessment & Plan Note (Addendum)
-  checks BP at home, 110's-120's/80's  -normotensive today  -started a new job,  will be weekens at [employer], she feels this will help her BP stabilize

## 2024-12-02 NOTE — Assessment & Plan Note (Signed)
-  Mood has bee good, denies concerns today,

## 2024-12-02 NOTE — Assessment & Plan Note (Signed)
-  heartbeat easily heard today, what to expect in regards to movement discussed  -has nutrition appointment at the end of the month -Anatomy US  with MFM scheduled

## 2024-12-02 NOTE — Assessment & Plan Note (Addendum)
-  TWG 6lbs, WNL -will pick up ASA today

## 2024-12-03 LAB — GLUCOSE TOLERANCE, 2 HOURS
Glucose, 2 hour: 93 mg/dL (ref 70–139)
Glucose, GTT - Fasting: 87 mg/dL (ref 70–99)

## 2024-12-04 ENCOUNTER — Telehealth: Payer: Self-pay

## 2024-12-04 NOTE — Telephone Encounter (Signed)
 SABRA

## 2024-12-05 ENCOUNTER — Encounter: Payer: Self-pay | Admitting: Licensed Practical Nurse

## 2024-12-05 ENCOUNTER — Other Ambulatory Visit: Payer: Self-pay | Admitting: Licensed Practical Nurse

## 2024-12-05 DIAGNOSIS — Z3480 Encounter for supervision of other normal pregnancy, unspecified trimester: Secondary | ICD-10-CM

## 2024-12-05 MED ORDER — COMPLETENATE 29-1 MG PO CHEW: 1.0000 | CHEWABLE_TABLET | Freq: Every day | ORAL | 3 refills | Status: AC

## 2024-12-05 NOTE — Progress Notes (Signed)
 Prenatal gummies on back order. Chewable tablets ordred  Jinnie Cookey, CNM   OB-GYN 12/05/24  8:49 AM

## 2024-12-06 ENCOUNTER — Ambulatory Visit: Payer: Self-pay | Admitting: Certified Nurse Midwife

## 2024-12-09 NOTE — Progress Notes (Unsigned)
 Medical Nutrition Therapy  Appointment Start time:  ***  Appointment End time:  ***  Primary concerns today: ***  Referral diagnosis: *** Preferred learning style: *** (auditory, visual, hands on, no preference indicated) Learning readiness: *** (not ready, contemplating, ready, change in progress)   NUTRITION ASSESSMENT    Clinical Medical Hx: *** Medications: *** Labs: OGTT: fasting 87, 1 hour 93 on 12/02/2024 Lab Results  Component Value Date   HGBA1C 6.0 (H) 10/31/2024   Notable Signs/Symptoms: EDD: 05/15/2025  Lifestyle & Dietary Hx ***  Estimated daily fluid intake: *** oz Supplements: *** Sleep: *** Stress / self-care: *** Current average weekly physical activity: ***  24-Hr Dietary Recall First Meal: *** Snack: *** Second Meal: *** Snack: *** Third Meal: *** Snack: *** Beverages: ***  Estimated Energy Needs Calories: *** Carbohydrate: ***g Protein: ***g Fat: ***g   NUTRITION DIAGNOSIS  {CHL AMB NUTRITIONAL DIAGNOSIS:[id number]}   NUTRITION INTERVENTION  Nutrition education (E-1) on the following topics:  ***  Handouts Provided Include  ***  Learning Style & Readiness for Change Teaching method utilized: Visual & Auditory  Demonstrated degree of understanding via: Teach Back  Barriers to learning/adherence to lifestyle change: ***  Goals Established by Pt ***   MONITORING & EVALUATION Dietary intake, weekly physical activity, and *** in ***.  Next Steps  Patient is to ***.

## 2024-12-16 ENCOUNTER — Encounter: Admitting: Dietician

## 2024-12-16 DIAGNOSIS — O0990 Supervision of high risk pregnancy, unspecified, unspecified trimester: Secondary | ICD-10-CM

## 2024-12-16 DIAGNOSIS — O24819 Other pre-existing diabetes mellitus in pregnancy, unspecified trimester: Secondary | ICD-10-CM

## 2024-12-17 ENCOUNTER — Inpatient Hospital Stay

## 2024-12-17 ENCOUNTER — Telehealth: Payer: Self-pay | Admitting: Oncology

## 2024-12-17 NOTE — Telephone Encounter (Signed)
 Pt missed lab appt today, I called and spoke with pt and appts have been r/s to next MORNING available per pt request of morning appts. New dates/times confirmed with pt

## 2024-12-18 ENCOUNTER — Inpatient Hospital Stay

## 2024-12-18 ENCOUNTER — Inpatient Hospital Stay: Admitting: Oncology

## 2024-12-20 ENCOUNTER — Ambulatory Visit: Attending: Internal Medicine | Admitting: Internal Medicine

## 2024-12-20 ENCOUNTER — Encounter: Payer: Self-pay | Admitting: Internal Medicine

## 2024-12-20 VITALS — BP 124/80 | HR 110 | Ht 66.0 in | Wt 276.4 lb

## 2024-12-20 DIAGNOSIS — R000 Tachycardia, unspecified: Secondary | ICD-10-CM | POA: Insufficient documentation

## 2024-12-20 DIAGNOSIS — R0602 Shortness of breath: Secondary | ICD-10-CM | POA: Insufficient documentation

## 2024-12-20 DIAGNOSIS — I10 Essential (primary) hypertension: Secondary | ICD-10-CM | POA: Insufficient documentation

## 2024-12-20 DIAGNOSIS — R079 Chest pain, unspecified: Secondary | ICD-10-CM

## 2024-12-20 NOTE — Progress Notes (Unsigned)
 " Cardiology Office Note:  .   Date:  12/21/2024  ID:  Jasmine Gordon, DOB 28-Jan-1986, MRN [id number] PCP: Edman Marsa PARAS, DO  [facility] Providers Cardiologist:  Lonni Hanson, MD     History of Present Illness: .   Jasmine Gordon is a 39 y.o. female with history of hyperlipidemia, iron  deficiency anemia, asthma, obesity, panic attacks, and PTSD, who presents for follow-up of shortness of breath, chest pain, and palpitations.  She was last seen in our office in 12/2023 by Barnie Hila, NP, at which time she reported feeling off the last couple of days.  She noted a few palpitations during stressful situations but otherwise was feeling well.  No medication changes or additional testing were pursued.  She presented to the [facility] emergency department earlier this month with shortness of breath in the setting of pregnancy.  ED workup was notable for EKG demonstrating mild sinus tachycardia but otherwise no abnormalities.  Labs were also unrevealing other than hemoglobin of 10.6, down slightly from baseline (11-12).  She notes that her children were sick with a viral illness at that time as well.  Today, [name] reports that she is feeling better though she still has sporadic shortness of breath (she notes this occurred with prior pregnancies as well).  She denies dyspnea at rest as well as orthopnea, PND, edema, and chest pain.  Dyspnea is sometimes triggered by exposure to cold air.  She has sporadic palpitations that she describes as a flicker in her chest.  She notes that her BP at home has been well-controlled, most recently 114/82.  She was advised to take aspirin  81 mg daily by her OB to minimize risk for preeclampsia.  ROS: See HPI  Studies Reviewed: SABRA   EKG Interpretation Date/Time:  Friday December 20 2024 09:28:56 EST Ventricular Rate:  110 PR Interval:  120 QRS Duration:  80 QT Interval:  342 QTC Calculation: 462 R Axis:   18  Text  Interpretation: Sinus tachycardia Nonspecific T wave abnormality Abnormal ECG When compared with ECG of 25-Nov-2024 18:50, No significant change was found Confirmed by Marcelino Campos, Lonni [phone]) on 12/21/2024 10:57:29 AM    Risk Assessment/Calculations:             Physical Exam:   VS:  BP 124/80 (Cuff Size: Large)   Pulse (!) 110 Comment: 120 oximeter  Ht 5' 6 (1.676 m)   Wt 276 lb 6.4 oz (125.4 kg)   LMP 08/08/2024 (Exact Date)   SpO2 99%   BMI 44.61 kg/m    Wt Readings from Last 3 Encounters:  12/20/24 276 lb 6.4 oz (125.4 kg)  12/02/24 274 lb 4.8 oz (124.4 kg)  11/15/24 273 lb 12.8 oz (124.2 kg)    General:  NAD. Neck: No JVD or HJR. Lungs: Clear to auscultation bilaterally without wheezes or crackles. Heart: Tachycardiac but regular without murmurs, rubs, or gallops. Abdomen: Soft, nontender, nondistended. Extremities: No lower extremity edema.  ASSESSMENT AND PLAN: .    Shortness of breath: Improved following recent ED visit.  Workup was unremarkable other than EKG demonstrating mild anemia and sinus tachycardia, both of which are not new findings.  I suspect that she may have had a viral illness at the time, as she notes that her children were under the weather at that time.  Some of the symptoms could also be due to physiologic changes associated with pregnancy.  She is asymptomatic today and appears euvolemic.  Defer additional testing/intervention at  this time.  Sinus tachycardia: Ms. [name] has a history of upper normal to mildly elevated heart rates at baseline.  Gravid state is likely exacerbating this.  I do not recommend further testing/intervention at this time unless tachycardia worsens and/or other symptoms develop.  Of note, TSH was normal on last check in 10/2024.  Hypertension: BP borderline elevated on initial check, improved on recheck.  Defer pharmacotherapy at this time.  Continue sodium restriction and home monitoring.     Dispo: Return to clinic in  6 months.  Signed, Lonni Hanson, MD  "

## 2024-12-20 NOTE — Patient Instructions (Signed)

## 2024-12-21 ENCOUNTER — Encounter: Payer: Self-pay | Admitting: Internal Medicine

## 2024-12-21 DIAGNOSIS — R000 Tachycardia, unspecified: Secondary | ICD-10-CM | POA: Insufficient documentation

## 2024-12-21 DIAGNOSIS — I10 Essential (primary) hypertension: Secondary | ICD-10-CM | POA: Insufficient documentation

## 2024-12-23 ENCOUNTER — Ambulatory Visit

## 2024-12-23 DIAGNOSIS — F4323 Adjustment disorder with mixed anxiety and depressed mood: Secondary | ICD-10-CM | POA: Insufficient documentation

## 2024-12-23 DIAGNOSIS — F439 Reaction to severe stress, unspecified: Secondary | ICD-10-CM | POA: Diagnosis not present

## 2024-12-30 ENCOUNTER — Other Ambulatory Visit

## 2024-12-30 DIAGNOSIS — O161 Unspecified maternal hypertension, first trimester: Secondary | ICD-10-CM

## 2024-12-30 DIAGNOSIS — O99212 Obesity complicating pregnancy, second trimester: Secondary | ICD-10-CM

## 2024-12-30 DIAGNOSIS — O0990 Supervision of high risk pregnancy, unspecified, unspecified trimester: Secondary | ICD-10-CM

## 2024-12-30 DIAGNOSIS — F419 Anxiety disorder, unspecified: Secondary | ICD-10-CM

## 2025-01-03 ENCOUNTER — Ambulatory Visit

## 2025-01-09 ENCOUNTER — Ambulatory Visit

## 2025-01-14 ENCOUNTER — Encounter: Admitting: Dietician

## 2025-01-16 ENCOUNTER — Ambulatory Visit

## 2025-02-20 ENCOUNTER — Inpatient Hospital Stay

## 2025-02-25 ENCOUNTER — Inpatient Hospital Stay: Admitting: Oncology

## 2025-02-25 ENCOUNTER — Inpatient Hospital Stay
